# Patient Record
Sex: Female | Born: 1940 | Race: White | Hispanic: No | Smoking: Former smoker
Health system: Southern US, Community
[De-identification: ages and names within clinical notes are randomized; demographics above are authoritative.]

## PROBLEM LIST (undated history)

## (undated) DIAGNOSIS — G4733 Obstructive sleep apnea (adult) (pediatric): Secondary | ICD-10-CM

## (undated) DIAGNOSIS — J984 Other disorders of lung: Secondary | ICD-10-CM

## (undated) DIAGNOSIS — E876 Hypokalemia: Secondary | ICD-10-CM

## (undated) DIAGNOSIS — K219 Gastro-esophageal reflux disease without esophagitis: Secondary | ICD-10-CM

## (undated) DIAGNOSIS — C439 Malignant melanoma of skin, unspecified: Secondary | ICD-10-CM

## (undated) DIAGNOSIS — E119 Type 2 diabetes mellitus without complications: Secondary | ICD-10-CM

## (undated) DIAGNOSIS — Z950 Presence of cardiac pacemaker: Secondary | ICD-10-CM

## (undated) DIAGNOSIS — Z8739 Personal history of other diseases of the musculoskeletal system and connective tissue: Secondary | ICD-10-CM

## (undated) DIAGNOSIS — H353220 Exudative age-related macular degeneration, left eye, stage unspecified: Secondary | ICD-10-CM

## (undated) DIAGNOSIS — I495 Sick sinus syndrome: Secondary | ICD-10-CM

## (undated) DIAGNOSIS — E538 Deficiency of other specified B group vitamins: Secondary | ICD-10-CM

## (undated) DIAGNOSIS — N289 Disorder of kidney and ureter, unspecified: Secondary | ICD-10-CM

## (undated) DIAGNOSIS — I2510 Atherosclerotic heart disease of native coronary artery without angina pectoris: Secondary | ICD-10-CM

## (undated) DIAGNOSIS — H353110 Nonexudative age-related macular degeneration, right eye, stage unspecified: Secondary | ICD-10-CM

## (undated) DIAGNOSIS — J449 Chronic obstructive pulmonary disease, unspecified: Secondary | ICD-10-CM

## (undated) DIAGNOSIS — I509 Heart failure, unspecified: Secondary | ICD-10-CM

## (undated) DIAGNOSIS — E559 Vitamin D deficiency, unspecified: Secondary | ICD-10-CM

## (undated) DIAGNOSIS — Z9289 Personal history of other medical treatment: Secondary | ICD-10-CM

## (undated) DIAGNOSIS — E0500 Thyrotoxicosis with diffuse goiter without thyrotoxic crisis or storm: Secondary | ICD-10-CM

## (undated) DIAGNOSIS — I519 Heart disease, unspecified: Secondary | ICD-10-CM

## (undated) DIAGNOSIS — N189 Chronic kidney disease, unspecified: Secondary | ICD-10-CM

## (undated) DIAGNOSIS — Z9989 Dependence on other enabling machines and devices: Secondary | ICD-10-CM

## (undated) DIAGNOSIS — I4891 Unspecified atrial fibrillation: Secondary | ICD-10-CM

## (undated) DIAGNOSIS — E039 Hypothyroidism, unspecified: Secondary | ICD-10-CM

## (undated) DIAGNOSIS — H3530 Unspecified macular degeneration: Secondary | ICD-10-CM

## (undated) DIAGNOSIS — I10 Essential (primary) hypertension: Secondary | ICD-10-CM

## (undated) DIAGNOSIS — I219 Acute myocardial infarction, unspecified: Secondary | ICD-10-CM

## (undated) DIAGNOSIS — M1990 Unspecified osteoarthritis, unspecified site: Secondary | ICD-10-CM

## (undated) DIAGNOSIS — E785 Hyperlipidemia, unspecified: Secondary | ICD-10-CM

## (undated) DIAGNOSIS — Z8489 Family history of other specified conditions: Secondary | ICD-10-CM

## (undated) DIAGNOSIS — Z8719 Personal history of other diseases of the digestive system: Secondary | ICD-10-CM

## (undated) DIAGNOSIS — M109 Gout, unspecified: Secondary | ICD-10-CM

## (undated) DIAGNOSIS — I951 Orthostatic hypotension: Secondary | ICD-10-CM

## (undated) HISTORY — DX: Hyperlipidemia, unspecified: E78.5

## (undated) HISTORY — DX: Heart disease, unspecified: I51.9

## (undated) HISTORY — PX: APPENDECTOMY: SHX54

## (undated) HISTORY — DX: Presence of cardiac pacemaker: Z95.0

## (undated) HISTORY — DX: Vitamin D deficiency, unspecified: E55.9

## (undated) HISTORY — DX: Hypokalemia: E87.6

## (undated) HISTORY — PX: JOINT REPLACEMENT: SHX530

## (undated) HISTORY — PX: CORONARY ANGIOPLASTY WITH STENT PLACEMENT: SHX49

## (undated) HISTORY — DX: Atherosclerotic heart disease of native coronary artery without angina pectoris: I25.10

## (undated) HISTORY — DX: Chronic obstructive pulmonary disease, unspecified: J44.9

## (undated) HISTORY — DX: Disorder of kidney and ureter, unspecified: N28.9

## (undated) HISTORY — PX: PARATHYROIDECTOMY: SHX19

## (undated) HISTORY — PX: LAPAROSCOPIC CHOLECYSTECTOMY: SUR755

## (undated) HISTORY — PX: VAGINAL HYSTERECTOMY: SUR661

## (undated) HISTORY — DX: Deficiency of other specified B group vitamins: E53.8

## (undated) HISTORY — DX: Gastro-esophageal reflux disease without esophagitis: K21.9

## (undated) HISTORY — DX: Gout, unspecified: M10.9

## (undated) HISTORY — PX: TONSILLECTOMY: SUR1361

## (undated) HISTORY — PX: ATRIAL FIBRILLATION ABLATION: EP1191

## (undated) HISTORY — PX: TOTAL SHOULDER ARTHROPLASTY: SHX126

## (undated) HISTORY — DX: Unspecified atrial fibrillation: I48.91

## (undated) HISTORY — DX: Unspecified macular degeneration: H35.30

## (undated) HISTORY — PX: MELANOMA EXCISION: SHX5266

## (undated) HISTORY — DX: Hypocalcemia: E83.51

## (undated) HISTORY — DX: Acute myocardial infarction, unspecified: I21.9

---
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
## 2010-11-23 ENCOUNTER — Encounter: Payer: Self-pay | Admitting: Family Medicine

## 2013-03-14 HISTORY — PX: COLONOSCOPY W/ BIOPSIES AND POLYPECTOMY: SHX1376

## 2013-03-21 DIAGNOSIS — H05249 Constant exophthalmos, unspecified eye: Secondary | ICD-10-CM | POA: Diagnosis not present

## 2013-03-21 DIAGNOSIS — E0501 Thyrotoxicosis with diffuse goiter with thyrotoxic crisis or storm: Secondary | ICD-10-CM | POA: Diagnosis not present

## 2013-03-21 DIAGNOSIS — H04129 Dry eye syndrome of unspecified lacrimal gland: Secondary | ICD-10-CM | POA: Diagnosis not present

## 2013-03-25 DIAGNOSIS — E782 Mixed hyperlipidemia: Secondary | ICD-10-CM | POA: Diagnosis not present

## 2013-03-25 DIAGNOSIS — J309 Allergic rhinitis, unspecified: Secondary | ICD-10-CM | POA: Diagnosis not present

## 2013-03-25 DIAGNOSIS — IMO0001 Reserved for inherently not codable concepts without codable children: Secondary | ICD-10-CM | POA: Diagnosis not present

## 2013-03-25 DIAGNOSIS — I10 Essential (primary) hypertension: Secondary | ICD-10-CM | POA: Diagnosis not present

## 2013-03-25 DIAGNOSIS — R197 Diarrhea, unspecified: Secondary | ICD-10-CM | POA: Diagnosis not present

## 2013-03-25 DIAGNOSIS — I4891 Unspecified atrial fibrillation: Secondary | ICD-10-CM | POA: Diagnosis not present

## 2013-03-25 DIAGNOSIS — E039 Hypothyroidism, unspecified: Secondary | ICD-10-CM | POA: Diagnosis not present

## 2013-03-27 DIAGNOSIS — E782 Mixed hyperlipidemia: Secondary | ICD-10-CM | POA: Diagnosis not present

## 2013-03-27 DIAGNOSIS — I10 Essential (primary) hypertension: Secondary | ICD-10-CM | POA: Diagnosis not present

## 2013-03-27 DIAGNOSIS — IMO0001 Reserved for inherently not codable concepts without codable children: Secondary | ICD-10-CM | POA: Diagnosis not present

## 2013-03-27 DIAGNOSIS — R5381 Other malaise: Secondary | ICD-10-CM | POA: Diagnosis not present

## 2013-04-11 DIAGNOSIS — Z7901 Long term (current) use of anticoagulants: Secondary | ICD-10-CM | POA: Diagnosis not present

## 2013-04-12 DIAGNOSIS — E039 Hypothyroidism, unspecified: Secondary | ICD-10-CM | POA: Diagnosis not present

## 2013-04-12 DIAGNOSIS — D509 Iron deficiency anemia, unspecified: Secondary | ICD-10-CM | POA: Diagnosis not present

## 2013-04-12 DIAGNOSIS — I10 Essential (primary) hypertension: Secondary | ICD-10-CM | POA: Diagnosis not present

## 2013-04-12 DIAGNOSIS — E782 Mixed hyperlipidemia: Secondary | ICD-10-CM | POA: Diagnosis not present

## 2013-04-12 DIAGNOSIS — E119 Type 2 diabetes mellitus without complications: Secondary | ICD-10-CM | POA: Diagnosis not present

## 2013-04-12 DIAGNOSIS — I4891 Unspecified atrial fibrillation: Secondary | ICD-10-CM | POA: Diagnosis not present

## 2013-04-12 DIAGNOSIS — Z006 Encounter for examination for normal comparison and control in clinical research program: Secondary | ICD-10-CM | POA: Diagnosis not present

## 2013-04-12 DIAGNOSIS — R197 Diarrhea, unspecified: Secondary | ICD-10-CM | POA: Diagnosis not present

## 2013-04-16 DIAGNOSIS — I4891 Unspecified atrial fibrillation: Secondary | ICD-10-CM | POA: Diagnosis not present

## 2013-04-16 DIAGNOSIS — R002 Palpitations: Secondary | ICD-10-CM | POA: Diagnosis not present

## 2013-04-17 DIAGNOSIS — R0989 Other specified symptoms and signs involving the circulatory and respiratory systems: Secondary | ICD-10-CM | POA: Diagnosis not present

## 2013-04-17 DIAGNOSIS — I872 Venous insufficiency (chronic) (peripheral): Secondary | ICD-10-CM | POA: Diagnosis not present

## 2013-04-22 DIAGNOSIS — R609 Edema, unspecified: Secondary | ICD-10-CM | POA: Diagnosis not present

## 2013-04-22 DIAGNOSIS — R0602 Shortness of breath: Secondary | ICD-10-CM | POA: Diagnosis not present

## 2013-04-22 DIAGNOSIS — R197 Diarrhea, unspecified: Secondary | ICD-10-CM | POA: Diagnosis not present

## 2013-04-23 DIAGNOSIS — Z9861 Coronary angioplasty status: Secondary | ICD-10-CM | POA: Diagnosis not present

## 2013-04-23 DIAGNOSIS — I2510 Atherosclerotic heart disease of native coronary artery without angina pectoris: Secondary | ICD-10-CM | POA: Diagnosis not present

## 2013-04-23 DIAGNOSIS — E785 Hyperlipidemia, unspecified: Secondary | ICD-10-CM | POA: Diagnosis not present

## 2013-04-23 DIAGNOSIS — I498 Other specified cardiac arrhythmias: Secondary | ICD-10-CM | POA: Diagnosis not present

## 2013-04-23 DIAGNOSIS — R9430 Abnormal result of cardiovascular function study, unspecified: Secondary | ICD-10-CM | POA: Diagnosis not present

## 2013-04-26 DIAGNOSIS — IMO0001 Reserved for inherently not codable concepts without codable children: Secondary | ICD-10-CM | POA: Diagnosis not present

## 2013-04-26 DIAGNOSIS — I70209 Unspecified atherosclerosis of native arteries of extremities, unspecified extremity: Secondary | ICD-10-CM | POA: Diagnosis not present

## 2013-04-26 DIAGNOSIS — R197 Diarrhea, unspecified: Secondary | ICD-10-CM | POA: Diagnosis not present

## 2013-04-26 DIAGNOSIS — R609 Edema, unspecified: Secondary | ICD-10-CM | POA: Diagnosis not present

## 2013-05-14 DIAGNOSIS — I4891 Unspecified atrial fibrillation: Secondary | ICD-10-CM | POA: Diagnosis not present

## 2013-05-14 DIAGNOSIS — E782 Mixed hyperlipidemia: Secondary | ICD-10-CM | POA: Diagnosis not present

## 2013-05-14 DIAGNOSIS — I10 Essential (primary) hypertension: Secondary | ICD-10-CM | POA: Diagnosis not present

## 2013-05-14 DIAGNOSIS — IMO0001 Reserved for inherently not codable concepts without codable children: Secondary | ICD-10-CM | POA: Diagnosis not present

## 2013-05-30 DIAGNOSIS — Z7901 Long term (current) use of anticoagulants: Secondary | ICD-10-CM | POA: Diagnosis not present

## 2013-07-12 DIAGNOSIS — IMO0001 Reserved for inherently not codable concepts without codable children: Secondary | ICD-10-CM | POA: Diagnosis not present

## 2013-07-12 DIAGNOSIS — E782 Mixed hyperlipidemia: Secondary | ICD-10-CM | POA: Diagnosis not present

## 2013-07-12 DIAGNOSIS — R5383 Other fatigue: Secondary | ICD-10-CM | POA: Diagnosis not present

## 2013-07-12 DIAGNOSIS — I10 Essential (primary) hypertension: Secondary | ICD-10-CM | POA: Diagnosis not present

## 2013-07-12 DIAGNOSIS — R5381 Other malaise: Secondary | ICD-10-CM | POA: Diagnosis not present

## 2013-07-12 DIAGNOSIS — E039 Hypothyroidism, unspecified: Secondary | ICD-10-CM | POA: Diagnosis not present

## 2013-07-26 DIAGNOSIS — E119 Type 2 diabetes mellitus without complications: Secondary | ICD-10-CM | POA: Diagnosis not present

## 2013-07-26 DIAGNOSIS — R197 Diarrhea, unspecified: Secondary | ICD-10-CM | POA: Diagnosis not present

## 2013-07-26 DIAGNOSIS — E782 Mixed hyperlipidemia: Secondary | ICD-10-CM | POA: Diagnosis not present

## 2013-07-26 DIAGNOSIS — E039 Hypothyroidism, unspecified: Secondary | ICD-10-CM | POA: Diagnosis not present

## 2013-07-26 DIAGNOSIS — J309 Allergic rhinitis, unspecified: Secondary | ICD-10-CM | POA: Diagnosis not present

## 2013-07-26 DIAGNOSIS — I10 Essential (primary) hypertension: Secondary | ICD-10-CM | POA: Diagnosis not present

## 2013-07-26 DIAGNOSIS — J301 Allergic rhinitis due to pollen: Secondary | ICD-10-CM | POA: Diagnosis not present

## 2013-07-29 DIAGNOSIS — Z7901 Long term (current) use of anticoagulants: Secondary | ICD-10-CM | POA: Diagnosis not present

## 2013-08-14 DIAGNOSIS — IMO0001 Reserved for inherently not codable concepts without codable children: Secondary | ICD-10-CM | POA: Diagnosis not present

## 2013-08-14 DIAGNOSIS — R197 Diarrhea, unspecified: Secondary | ICD-10-CM | POA: Diagnosis not present

## 2013-08-14 DIAGNOSIS — E782 Mixed hyperlipidemia: Secondary | ICD-10-CM | POA: Diagnosis not present

## 2013-08-21 DIAGNOSIS — R05 Cough: Secondary | ICD-10-CM | POA: Diagnosis not present

## 2013-08-21 DIAGNOSIS — R059 Cough, unspecified: Secondary | ICD-10-CM | POA: Diagnosis not present

## 2013-08-21 DIAGNOSIS — J018 Other acute sinusitis: Secondary | ICD-10-CM | POA: Diagnosis not present

## 2013-09-09 DIAGNOSIS — Z7901 Long term (current) use of anticoagulants: Secondary | ICD-10-CM | POA: Diagnosis not present

## 2013-10-15 DIAGNOSIS — E212 Other hyperparathyroidism: Secondary | ICD-10-CM | POA: Diagnosis not present

## 2013-10-15 DIAGNOSIS — Z0000 Encounter for general adult medical examination without abnormal findings: Secondary | ICD-10-CM | POA: Diagnosis not present

## 2013-10-15 DIAGNOSIS — I10 Essential (primary) hypertension: Secondary | ICD-10-CM | POA: Diagnosis not present

## 2013-10-15 DIAGNOSIS — E039 Hypothyroidism, unspecified: Secondary | ICD-10-CM | POA: Diagnosis not present

## 2013-10-15 DIAGNOSIS — Z87891 Personal history of nicotine dependence: Secondary | ICD-10-CM | POA: Diagnosis not present

## 2013-10-15 DIAGNOSIS — C439 Malignant melanoma of skin, unspecified: Secondary | ICD-10-CM | POA: Diagnosis not present

## 2013-10-15 DIAGNOSIS — I4891 Unspecified atrial fibrillation: Secondary | ICD-10-CM | POA: Diagnosis not present

## 2013-10-15 DIAGNOSIS — E119 Type 2 diabetes mellitus without complications: Secondary | ICD-10-CM | POA: Diagnosis not present

## 2013-10-15 DIAGNOSIS — Z136 Encounter for screening for cardiovascular disorders: Secondary | ICD-10-CM | POA: Diagnosis not present

## 2013-10-15 DIAGNOSIS — E782 Mixed hyperlipidemia: Secondary | ICD-10-CM | POA: Diagnosis not present

## 2013-10-17 DIAGNOSIS — Z7901 Long term (current) use of anticoagulants: Secondary | ICD-10-CM | POA: Diagnosis not present

## 2013-10-18 DIAGNOSIS — E212 Other hyperparathyroidism: Secondary | ICD-10-CM | POA: Diagnosis not present

## 2013-10-18 DIAGNOSIS — E785 Hyperlipidemia, unspecified: Secondary | ICD-10-CM | POA: Diagnosis not present

## 2013-10-18 DIAGNOSIS — E782 Mixed hyperlipidemia: Secondary | ICD-10-CM | POA: Diagnosis not present

## 2013-10-18 DIAGNOSIS — E039 Hypothyroidism, unspecified: Secondary | ICD-10-CM | POA: Diagnosis not present

## 2013-10-18 DIAGNOSIS — E119 Type 2 diabetes mellitus without complications: Secondary | ICD-10-CM | POA: Diagnosis not present

## 2013-10-24 DIAGNOSIS — L821 Other seborrheic keratosis: Secondary | ICD-10-CM | POA: Diagnosis not present

## 2013-10-24 DIAGNOSIS — L259 Unspecified contact dermatitis, unspecified cause: Secondary | ICD-10-CM | POA: Diagnosis not present

## 2013-11-01 DIAGNOSIS — E785 Hyperlipidemia, unspecified: Secondary | ICD-10-CM | POA: Diagnosis not present

## 2013-11-01 DIAGNOSIS — E119 Type 2 diabetes mellitus without complications: Secondary | ICD-10-CM | POA: Diagnosis not present

## 2013-11-01 DIAGNOSIS — R002 Palpitations: Secondary | ICD-10-CM | POA: Diagnosis not present

## 2013-11-01 DIAGNOSIS — I10 Essential (primary) hypertension: Secondary | ICD-10-CM | POA: Diagnosis not present

## 2013-11-01 DIAGNOSIS — I4891 Unspecified atrial fibrillation: Secondary | ICD-10-CM | POA: Diagnosis not present

## 2013-11-06 DIAGNOSIS — I4891 Unspecified atrial fibrillation: Secondary | ICD-10-CM | POA: Diagnosis not present

## 2013-11-26 DIAGNOSIS — Z23 Encounter for immunization: Secondary | ICD-10-CM | POA: Diagnosis not present

## 2013-11-29 DIAGNOSIS — I4891 Unspecified atrial fibrillation: Secondary | ICD-10-CM | POA: Diagnosis not present

## 2013-11-29 DIAGNOSIS — E119 Type 2 diabetes mellitus without complications: Secondary | ICD-10-CM | POA: Diagnosis not present

## 2013-11-29 DIAGNOSIS — R002 Palpitations: Secondary | ICD-10-CM | POA: Diagnosis not present

## 2013-11-29 DIAGNOSIS — E785 Hyperlipidemia, unspecified: Secondary | ICD-10-CM | POA: Diagnosis not present

## 2013-11-29 DIAGNOSIS — I10 Essential (primary) hypertension: Secondary | ICD-10-CM | POA: Diagnosis not present

## 2013-11-29 DIAGNOSIS — Z87891 Personal history of nicotine dependence: Secondary | ICD-10-CM | POA: Diagnosis not present

## 2013-11-29 DIAGNOSIS — Z0000 Encounter for general adult medical examination without abnormal findings: Secondary | ICD-10-CM | POA: Diagnosis not present

## 2013-11-29 DIAGNOSIS — Z6836 Body mass index (BMI) 36.0-36.9, adult: Secondary | ICD-10-CM | POA: Diagnosis not present

## 2014-01-03 DIAGNOSIS — I4891 Unspecified atrial fibrillation: Secondary | ICD-10-CM | POA: Diagnosis not present

## 2014-01-10 DIAGNOSIS — I482 Chronic atrial fibrillation: Secondary | ICD-10-CM | POA: Diagnosis not present

## 2014-01-10 DIAGNOSIS — N390 Urinary tract infection, site not specified: Secondary | ICD-10-CM | POA: Diagnosis not present

## 2014-01-10 DIAGNOSIS — Z87891 Personal history of nicotine dependence: Secondary | ICD-10-CM | POA: Diagnosis not present

## 2014-01-10 DIAGNOSIS — Z6835 Body mass index (BMI) 35.0-35.9, adult: Secondary | ICD-10-CM | POA: Diagnosis not present

## 2014-01-10 DIAGNOSIS — Z0001 Encounter for general adult medical examination with abnormal findings: Secondary | ICD-10-CM | POA: Diagnosis not present

## 2014-01-10 DIAGNOSIS — I10 Essential (primary) hypertension: Secondary | ICD-10-CM | POA: Diagnosis not present

## 2014-01-17 DIAGNOSIS — N390 Urinary tract infection, site not specified: Secondary | ICD-10-CM | POA: Diagnosis not present

## 2014-01-17 DIAGNOSIS — I4891 Unspecified atrial fibrillation: Secondary | ICD-10-CM | POA: Diagnosis not present

## 2014-01-17 DIAGNOSIS — I480 Paroxysmal atrial fibrillation: Secondary | ICD-10-CM | POA: Diagnosis not present

## 2014-01-31 DIAGNOSIS — I480 Paroxysmal atrial fibrillation: Secondary | ICD-10-CM | POA: Diagnosis not present

## 2014-02-18 DIAGNOSIS — I480 Paroxysmal atrial fibrillation: Secondary | ICD-10-CM | POA: Diagnosis not present

## 2014-02-25 DIAGNOSIS — I480 Paroxysmal atrial fibrillation: Secondary | ICD-10-CM | POA: Diagnosis not present

## 2014-03-03 DIAGNOSIS — I10 Essential (primary) hypertension: Secondary | ICD-10-CM | POA: Diagnosis not present

## 2014-03-03 DIAGNOSIS — Z87891 Personal history of nicotine dependence: Secondary | ICD-10-CM | POA: Diagnosis not present

## 2014-03-03 DIAGNOSIS — I480 Paroxysmal atrial fibrillation: Secondary | ICD-10-CM | POA: Diagnosis not present

## 2014-03-03 DIAGNOSIS — E039 Hypothyroidism, unspecified: Secondary | ICD-10-CM | POA: Diagnosis not present

## 2014-03-03 DIAGNOSIS — Z6836 Body mass index (BMI) 36.0-36.9, adult: Secondary | ICD-10-CM | POA: Diagnosis not present

## 2014-03-03 DIAGNOSIS — Z0001 Encounter for general adult medical examination with abnormal findings: Secondary | ICD-10-CM | POA: Diagnosis not present

## 2014-03-03 DIAGNOSIS — E1165 Type 2 diabetes mellitus with hyperglycemia: Secondary | ICD-10-CM | POA: Diagnosis not present

## 2014-03-03 DIAGNOSIS — G4730 Sleep apnea, unspecified: Secondary | ICD-10-CM | POA: Diagnosis not present

## 2015-08-13 ENCOUNTER — Ambulatory Visit: Payer: Self-pay | Admitting: Cardiology

## 2015-08-17 DIAGNOSIS — Z961 Presence of intraocular lens: Secondary | ICD-10-CM | POA: Diagnosis not present

## 2015-08-17 DIAGNOSIS — H353222 Exudative age-related macular degeneration, left eye, with inactive choroidal neovascularization: Secondary | ICD-10-CM | POA: Diagnosis not present

## 2015-08-17 DIAGNOSIS — E119 Type 2 diabetes mellitus without complications: Secondary | ICD-10-CM | POA: Diagnosis not present

## 2015-08-17 DIAGNOSIS — H353112 Nonexudative age-related macular degeneration, right eye, intermediate dry stage: Secondary | ICD-10-CM | POA: Diagnosis not present

## 2015-08-19 ENCOUNTER — Encounter: Payer: Self-pay | Admitting: Family Medicine

## 2015-08-19 ENCOUNTER — Ambulatory Visit (INDEPENDENT_AMBULATORY_CARE_PROVIDER_SITE_OTHER): Payer: Medicare Other | Admitting: Family Medicine

## 2015-08-19 VITALS — BP 120/84 | HR 78 | Ht 63.0 in | Wt 212.0 lb

## 2015-08-19 DIAGNOSIS — E538 Deficiency of other specified B group vitamins: Secondary | ICD-10-CM

## 2015-08-19 DIAGNOSIS — H3530 Unspecified macular degeneration: Secondary | ICD-10-CM | POA: Diagnosis not present

## 2015-08-19 DIAGNOSIS — K222 Esophageal obstruction: Secondary | ICD-10-CM

## 2015-08-19 DIAGNOSIS — E119 Type 2 diabetes mellitus without complications: Secondary | ICD-10-CM | POA: Diagnosis not present

## 2015-08-19 DIAGNOSIS — E559 Vitamin D deficiency, unspecified: Secondary | ICD-10-CM | POA: Diagnosis not present

## 2015-08-19 DIAGNOSIS — I5022 Chronic systolic (congestive) heart failure: Secondary | ICD-10-CM | POA: Diagnosis not present

## 2015-08-19 DIAGNOSIS — Z23 Encounter for immunization: Secondary | ICD-10-CM | POA: Diagnosis not present

## 2015-08-19 DIAGNOSIS — E669 Obesity, unspecified: Secondary | ICD-10-CM

## 2015-08-19 DIAGNOSIS — Z923 Personal history of irradiation: Secondary | ICD-10-CM

## 2015-08-19 DIAGNOSIS — E1159 Type 2 diabetes mellitus with other circulatory complications: Secondary | ICD-10-CM | POA: Insufficient documentation

## 2015-08-19 DIAGNOSIS — D509 Iron deficiency anemia, unspecified: Secondary | ICD-10-CM | POA: Diagnosis not present

## 2015-08-19 DIAGNOSIS — M1000 Idiopathic gout, unspecified site: Secondary | ICD-10-CM

## 2015-08-19 DIAGNOSIS — E034 Atrophy of thyroid (acquired): Secondary | ICD-10-CM | POA: Diagnosis not present

## 2015-08-19 DIAGNOSIS — E038 Other specified hypothyroidism: Secondary | ICD-10-CM | POA: Diagnosis not present

## 2015-08-19 DIAGNOSIS — I480 Paroxysmal atrial fibrillation: Secondary | ICD-10-CM

## 2015-08-19 DIAGNOSIS — K219 Gastro-esophageal reflux disease without esophagitis: Secondary | ICD-10-CM | POA: Diagnosis not present

## 2015-08-19 DIAGNOSIS — E892 Postprocedural hypoparathyroidism: Secondary | ICD-10-CM

## 2015-08-19 DIAGNOSIS — E1165 Type 2 diabetes mellitus with hyperglycemia: Secondary | ICD-10-CM

## 2015-08-19 DIAGNOSIS — Z9009 Acquired absence of other part of head and neck: Secondary | ICD-10-CM

## 2015-08-19 NOTE — Patient Instructions (Addendum)
Taper off Protonix as tolerated.

## 2015-08-20 ENCOUNTER — Ambulatory Visit (INDEPENDENT_AMBULATORY_CARE_PROVIDER_SITE_OTHER): Payer: Medicare Other | Admitting: Cardiology

## 2015-08-20 ENCOUNTER — Encounter: Payer: Self-pay | Admitting: Cardiology

## 2015-08-20 ENCOUNTER — Other Ambulatory Visit: Payer: Self-pay | Admitting: Family Medicine

## 2015-08-20 VITALS — BP 124/76 | HR 74 | Ht 63.0 in | Wt 213.0 lb

## 2015-08-20 DIAGNOSIS — I25708 Atherosclerosis of coronary artery bypass graft(s), unspecified, with other forms of angina pectoris: Secondary | ICD-10-CM

## 2015-08-20 DIAGNOSIS — I10 Essential (primary) hypertension: Secondary | ICD-10-CM

## 2015-08-20 DIAGNOSIS — I4891 Unspecified atrial fibrillation: Secondary | ICD-10-CM | POA: Diagnosis not present

## 2015-08-20 DIAGNOSIS — K222 Esophageal obstruction: Secondary | ICD-10-CM

## 2015-08-20 DIAGNOSIS — R079 Chest pain, unspecified: Secondary | ICD-10-CM | POA: Diagnosis not present

## 2015-08-20 DIAGNOSIS — I440 Atrioventricular block, first degree: Secondary | ICD-10-CM

## 2015-08-20 DIAGNOSIS — Z955 Presence of coronary angioplasty implant and graft: Secondary | ICD-10-CM

## 2015-08-20 DIAGNOSIS — E892 Postprocedural hypoparathyroidism: Secondary | ICD-10-CM | POA: Insufficient documentation

## 2015-08-20 DIAGNOSIS — N183 Chronic kidney disease, stage 3 unspecified: Secondary | ICD-10-CM

## 2015-08-20 DIAGNOSIS — H3530 Unspecified macular degeneration: Secondary | ICD-10-CM | POA: Insufficient documentation

## 2015-08-20 DIAGNOSIS — K219 Gastro-esophageal reflux disease without esophagitis: Secondary | ICD-10-CM | POA: Insufficient documentation

## 2015-08-20 DIAGNOSIS — E669 Obesity, unspecified: Secondary | ICD-10-CM | POA: Insufficient documentation

## 2015-08-20 DIAGNOSIS — Z9009 Acquired absence of other part of head and neck: Secondary | ICD-10-CM | POA: Insufficient documentation

## 2015-08-20 DIAGNOSIS — E785 Hyperlipidemia, unspecified: Secondary | ICD-10-CM

## 2015-08-20 DIAGNOSIS — I5030 Unspecified diastolic (congestive) heart failure: Secondary | ICD-10-CM | POA: Insufficient documentation

## 2015-08-20 DIAGNOSIS — Z923 Personal history of irradiation: Secondary | ICD-10-CM | POA: Insufficient documentation

## 2015-08-20 LAB — COMPREHENSIVE METABOLIC PANEL
ALBUMIN: 4.4 g/dL (ref 3.5–4.8)
ALK PHOS: 168 IU/L — AB (ref 39–117)
ALT: 21 IU/L (ref 0–32)
AST: 25 IU/L (ref 0–40)
Albumin/Globulin Ratio: 1.8 (ref 1.2–2.2)
BILIRUBIN TOTAL: 0.3 mg/dL (ref 0.0–1.2)
BUN / CREAT RATIO: 20 (ref 12–28)
BUN: 23 mg/dL (ref 8–27)
CHLORIDE: 102 mmol/L (ref 96–106)
CO2: 23 mmol/L (ref 18–29)
CREATININE: 1.13 mg/dL — AB (ref 0.57–1.00)
Calcium: 9.2 mg/dL (ref 8.7–10.3)
GFR calc Af Amer: 55 mL/min/{1.73_m2} — ABNORMAL LOW (ref >59)
GFR calc non Af Amer: 48 mL/min/{1.73_m2} — ABNORMAL LOW (ref >59)
GLUCOSE: 156 mg/dL — AB (ref 65–99)
Globulin, Total: 2.5 g/dL (ref 1.5–4.5)
Potassium: 4 mmol/L (ref 3.5–5.2)
SODIUM: 145 mmol/L — AB (ref 134–144)
Total Protein: 6.9 g/dL (ref 6.0–8.5)

## 2015-08-20 LAB — CBC
HEMATOCRIT: 37.8 % (ref 34.0–46.6)
Hemoglobin: 12.4 g/dL (ref 11.1–15.9)
MCH: 30.4 pg (ref 26.6–33.0)
MCHC: 32.8 g/dL (ref 31.5–35.7)
MCV: 93 fL (ref 79–97)
PLATELETS: 223 10*3/uL (ref 150–379)
RBC: 4.08 x10E6/uL (ref 3.77–5.28)
RDW: 16.2 % — AB (ref 12.3–15.4)
WBC: 6.4 10*3/uL (ref 3.4–10.8)

## 2015-08-20 LAB — MICROALBUMIN / CREATININE URINE RATIO
CREATININE, UR: 51.1 mg/dL
Microalbumin, Urine: <3 ug/mL

## 2015-08-20 LAB — TSH+T4F+T3FREE
FREE T4: 0.93 ng/dL (ref 0.82–1.77)
T3 FREE: 3.1 pg/mL (ref 2.0–4.4)
TSH: 0.229 u[IU]/mL — AB (ref 0.450–4.500)

## 2015-08-20 LAB — HEMOGLOBIN A1C
Est. average glucose Bld gHb Est-mCnc: 151 mg/dL
Hgb A1c MFr Bld: 6.9 % — ABNORMAL HIGH (ref 4.8–5.6)

## 2015-08-20 LAB — PROTIME-INR
INR: 2.1 — AB (ref 0.8–1.2)
PROTHROMBIN TIME: 21.8 s — AB (ref 9.1–12.0)

## 2015-08-20 LAB — VITAMIN B12

## 2015-08-20 LAB — VITAMIN D 25 HYDROXY (VIT D DEFICIENCY, FRACTURES): Vit D, 25-Hydroxy: 59 ng/mL (ref 30.0–100.0)

## 2015-08-20 LAB — URIC ACID: URIC ACID: 3.6 mg/dL (ref 2.5–7.1)

## 2015-08-20 MED ORDER — ALLOPURINOL 100 MG PO TABS: 100.0000 mg | ORAL_TABLET | Freq: Every day | ORAL | Status: DC

## 2015-08-20 MED ORDER — THYROID 97.5 MG PO TABS: 1.0000 | ORAL_TABLET | Freq: Every day | ORAL | Status: DC

## 2015-08-20 NOTE — Patient Instructions (Addendum)
Medication Instructions:  Your physician recommends that you continue on your current medications as directed. Please refer to the Current Medication list given to you today.   Labwork: Coumadin clinic for INR check.  Date & Time: ___________________________________________________________  Testing/Procedures: Your physician has requested that you have an echocardiogram. Echocardiography is a painless test that uses sound waves to create images of your heart. It provides your doctor with information about the size and shape of your heart and how well your heart's chambers and valves are working. This procedure takes approximately one hour. There are no restrictions for this procedure.  Date & Time: _____________________________________________________________  [facility]  Your caregiver has ordered a Stress Test with nuclear imaging. The purpose of this test is to evaluate the blood supply to your heart muscle. This procedure is referred to as a "Non-Invasive Stress Test." This is because other than having an IV started in your vein, nothing is inserted or "invades" your body. Cardiac stress tests are done to find areas of poor blood flow to the heart by determining the extent of coronary artery disease (CAD). Some patients exercise on a treadmill, which naturally increases the blood flow to your heart, while others who are  unable to walk on a treadmill due to physical limitations have a pharmacologic/chemical stress agent called Lexiscan . This medicine will mimic walking on a treadmill by temporarily increasing your coronary blood flow.   Please note: these test may take anywhere between 2-4 hours to complete  PLEASE REPORT TO [facility] AT THE FIRST DESK WILL DIRECT YOU WHERE TO GO  Date of Procedure:__Friday September 04, 2015 at 07:30AM__  Arrival Time for Procedure:____Arrive at 07:15AM_________  Instructions regarding medication:   __X__ : Hold  diabetes medication morning of procedure Metformin  __X__:  Hold Lasix the morning of procedure   PLEASE NOTIFY THE OFFICE AT LEAST 24 HOURS IN ADVANCE IF YOU ARE UNABLE TO KEEP YOUR APPOINTMENT.  [phone] AND  PLEASE NOTIFY NUCLEAR MEDICINE AT [facility] AT LEAST 24 HOURS IN ADVANCE IF YOU ARE UNABLE TO KEEP YOUR APPOINTMENT. [phone]  How to prepare for your Myoview test:   Do not eat or drink after midnight  No caffeine for 24 hours prior to test  No smoking 24 hours prior to test.  Your medication may be taken with water.  If your doctor stopped a medication because of this test, do not take that medication.  Ladies, please do not wear dresses.  Skirts or pants are appropriate. Please wear a short sleeve shirt.  No perfume, cologne or lotion.  Wear comfortable walking shoes. No heels!   Follow-Up: Your physician recommends that you schedule a follow-up appointment after testing to review results with Dr. Yvone Neu.  Date & Time: __________________________________________________________   Any Other Special Instructions Will Be Listed Below (If Applicable).     If you need a refill on your cardiac medications before your next appointment, please call your pharmacy.  Echocardiogram An echocardiogram, or echocardiography, uses sound waves (ultrasound) to produce an image of your heart. The echocardiogram is simple, painless, obtained within a short period of time, and offers valuable information to your health care provider. The images from an echocardiogram can provide information such as:  Evidence of coronary artery disease (CAD).  Heart size.  Heart muscle function.  Heart valve function.  Aneurysm detection.  Evidence of a past heart attack.  Fluid buildup around the heart.  Heart muscle thickening.  Assess heart  valve function. LET [facility] CARE PROVIDER KNOW ABOUT:  Any allergies you have.  All medicines you are taking, including vitamins,  herbs, eye drops, creams, and over-the-counter medicines.  Previous problems you or members of your family have had with the use of anesthetics.  Any blood disorders you have.  Previous surgeries you have had.  Medical conditions you have.  Possibility of pregnancy, if this applies. BEFORE THE PROCEDURE  No special preparation is needed. Eat and drink normally.  PROCEDURE   In order to produce an image of your heart, gel will be applied to your chest and a wand-like tool (transducer) will be moved over your chest. The gel will help transmit the sound waves from the transducer. The sound waves will harmlessly bounce off your heart to allow the heart images to be captured in real-time motion. These images will then be recorded.  You may need an IV to receive a medicine that improves the quality of the pictures. AFTER THE PROCEDURE You may return to your normal schedule including diet, activities, and medicines, unless your health care provider tells you otherwise.   This information is not intended to replace advice given to you by your health care provider. Make sure you discuss any questions you have with your health care provider.   Document Released: 02/26/2000 Document Revised: 03/21/2014 Document Reviewed: 11/05/2012 Elsevier Interactive Patient Education 2016 [place].   Pharmacologic Stress Electrocardiogram A pharmacologic stress electrocardiogram is a heart (cardiac) test that uses nuclear imaging to evaluate the blood supply to your heart. This test may also be called a pharmacologic stress electrocardiography. Pharmacologic means that a medicine is used to increase your heart rate and blood pressure.  This stress test is done to find areas of poor blood flow to the heart by determining the extent of coronary artery disease (CAD). Some people exercise on a treadmill, which naturally increases the blood flow to the heart. For those people unable to exercise on a treadmill, a  medicine is used. This medicine stimulates your heart and will cause your heart to beat harder and more quickly, as if you were exercising.  Pharmacologic stress tests can help determine:  The adequacy of blood flow to your heart during increased levels of activity in order to clear you for discharge home.  The extent of coronary artery blockage caused by CAD.  Your prognosis if you have suffered a heart attack.  The effectiveness of cardiac procedures done, such as an angioplasty, which can increase the circulation in your coronary arteries.  Causes of chest pain or pressure. LET [facility] CARE PROVIDER KNOW ABOUT:  Any allergies you have.  All medicines you are taking, including vitamins, herbs, eye drops, creams, and over-the-counter medicines.  Previous problems you or members of your family have had with the use of anesthetics.  Any blood disorders you have.  Previous surgeries you have had.  Medical conditions you have.  Possibility of pregnancy, if this applies.  If you are currently breastfeeding. RISKS AND COMPLICATIONS Generally, this is a safe procedure. However, as with any procedure, complications can occur. Possible complications include:  You develop pain or pressure in the following areas:  Chest.  Jaw or neck.  Between your shoulder blades.  Radiating down your left arm.  Headache.  Dizziness or light-headedness.  Shortness of breath.  Increased or irregular heartbeat.  Low blood pressure.  Nausea or vomiting.  Flushing.  Redness going up the arm and slight pain during injection of medicine.  Heart attack (rare). BEFORE THE PROCEDURE   Avoid all forms of caffeine for 24 hours before your test or as directed by your health care provider. This includes coffee, tea (even decaffeinated tea), caffeinated sodas, chocolate, cocoa, and certain pain medicines.  Follow your health care provider's instructions regarding eating and drinking before  the test.  Take your medicines as directed at regular times with water unless instructed otherwise. Exceptions may include:  If you have diabetes, ask how you are to take your insulin or pills. It is common to adjust insulin dosing the morning of the test.  If you are taking beta-blocker medicines, it is important to talk to your health care provider about these medicines well before the date of your test. Taking beta-blocker medicines may interfere with the test. In some cases, these medicines need to be changed or stopped 24 hours or more before the test.  If you wear a nitroglycerin patch, it may need to be removed prior to the test. Ask your health care provider if the patch should be removed before the test.  If you use an inhaler for any breathing condition, bring it with you to the test.  If you are an outpatient, bring a snack so you can eat right after the stress phase of the test.  Do not smoke for 4 hours prior to the test or as directed by your health care provider.  Do not apply lotions, powders, creams, or oils on your chest prior to the test.  Wear comfortable shoes and clothing. Let your health care provider know if you were unable to complete or follow the preparations for your test. PROCEDURE   Multiple patches (electrodes) will be put on your chest. If needed, small areas of your chest may be shaved to get better contact with the electrodes. Once the electrodes are attached to your body, multiple wires will be attached to the electrodes, and your heart rate will be monitored.  An IV access will be started. A nuclear trace (isotope) is given. The isotope may be given intravenously, or it may be swallowed. Nuclear refers to several types of radioactive isotopes, and the nuclear isotope lights up the arteries so that the nuclear images are clear. The isotope is absorbed by your body. This results in low radiation exposure.  A resting nuclear image is taken to show how your  heart functions at rest.  A medicine is given through the IV access.  A second scan is done about 1 hour after the medicine injection and determines how your heart functions under stress.  During this stress phase, you will be connected to an electrocardiogram machine. Your blood pressure and oxygen levels will be monitored. AFTER THE PROCEDURE   Your heart rate and blood pressure will be monitored after the test.  You may return to your normal schedule, including diet,activities, and medicines, unless your health care provider tells you otherwise.   This information is not intended to replace advice given to you by your health care provider. Make sure you discuss any questions you have with your health care provider.   Document Released: 07/17/2008 Document Revised: 03/05/2013 Document Reviewed: 11/05/2012 Elsevier Interactive Patient Education 2016 [employer]   Warfarin: What You Need to Know Warfarin is an anticoagulant. Anticoagulants help prevent the formation of blood clots. They also help stop the growth of blood clots. Warfarin is sometimes referred to as a "blood thinner."  Normally, when body tissues are cut or damaged, the blood clots in  order to prevent blood loss. Sometimes clots form inside your blood vessels and obstruct the flow of blood through your circulatory system (thrombosis). These clots may travel through your bloodstream and become lodged in smaller blood vessels in your brain, which can cause a stroke, or in your lungs (pulmonary embolism). WHO SHOULD USE WARFARIN? Warfarin is prescribed for people at risk of developing harmful blood clots:  People with surgically implanted mechanical heart valves, irregular heart rhythms called atrial fibrillation, and certain clotting disorders.  People who have developed harmful blood clotting in the past, including those who have had a stroke or a pulmonary embolism, or thrombosis in their legs (deep vein thrombosis  [DVT]).  People with an existing blood clot, such as a pulmonary embolism. WARFARIN DOSING Warfarin tablets come in different strengths. Each tablet strength is a different color, with the amount of warfarin (in milligrams) clearly printed on the tablet. If the color of your tablet is different than usual when you receive a new prescription, report it immediately to your pharmacist or health care provider. WARFARIN MONITORING The goal of warfarin therapy is to lessen the clotting tendency of blood but not prevent clotting completely. Your health care provider will monitor the anticoagulation effect of warfarin closely and adjust your dose as needed. For your safety, blood tests called prothrombin time (PT) or international normalized ratio (INR) are used to measure the effects of warfarin. Both of these tests can be done with a finger stick or a blood draw. The longer it takes the blood to clot, the higher the PT or INR. Your health care provider will inform you of your "target" PT or INR range. If, at any time, your PT or INR is above the target range, there is a risk of bleeding. If your PT or INR is below the target range, there is a risk of clotting. Whether you are started on warfarin while you are in the hospital or in your health care provider's office, you will need to have your PT or INR checked within one week of starting the medicine. Initially, some people are asked to have their PT or INR checked as much as twice a week. Once you are on a stable maintenance dose, the PT or INR is checked less often, usually once every 2 to 4 weeks. The warfarin dose may be adjusted if the PT or INR is not within the target range. It is important to keep all laboratory and health care provider follow-up appointments. Not keeping appointments could result in a chronic or permanent injury, pain, or disability because warfarin is a medicine that requires close monitoring. WHAT ARE THE SIDE EFFECTS OF  WARFARIN?  Too much warfarin can cause bleeding (hemorrhage) from any part of the body. This may include bleeding from the gums, blood in the urine, bloody or dark stools, a nosebleed that is not easily stopped, coughing up blood, or vomiting blood.  Too little warfarin can increase the risk of blood clots.  Too little or too much warfarin can also increase the risk of a stroke.  Warfarin use may cause a skin rash or irritation, an unusual fever, continual nausea or stomach upset, or severe pain in your joints or back. SPECIAL PRECAUTIONS WHILE TAKING WARFARIN Warfarin should be taken exactly as directed. It is very important to take warfarin as directed since bleeding or blood clots could result in chronic or permanent injury, pain, or disability.  Take your medicine at the same time every day. If you  forget to take your dose, you can take it if it is within 6 hours of when it was due.  Do not change the dose of warfarin on your own to make up for missed or extra doses.  If you miss more than 2 doses in a row, you should contact your health care provider for advice. Avoid situations that cause bleeding. You may have a tendency to bleed more easily than usual while taking warfarin. The following actions can limit bleeding:  Using a softer toothbrush.  Flossing with waxed floss rather than unwaxed floss.  Shaving with an Copy rather than a blade.  Limiting the use of sharp objects.  Avoiding potentially harmful activities, such as contact sports. Warfarin and Pregnancy or Breastfeeding  Warfarin is not advised during the first trimester of pregnancy due to an increased risk of birth defects. In certain situations, a woman may take warfarin after her first trimester of pregnancy. A woman who becomes pregnant or plans to become pregnant while taking warfarin should notify her health care provider immediately.  Although warfarin does not pass into breast milk, a woman who wishes  to breastfeed while taking warfarin should also consult with her health care provider. Alcohol, Smoking, and Illicit Drug Use  Alcohol affects how warfarin works in the body. It is best to avoid alcoholic drinks or consume very small amounts while taking warfarin. In general, alcohol intake should be limited to 1 oz (30 mL) of liquor, 6 oz (180 mL) of wine, or 12 oz (360 mL) of beer each day. Notify your health care provider if you change your alcohol intake.  Smoking affects how warfarin works. It is best to avoid smoking while taking warfarin. Notify your health care provider if you change your smoking habits.  It is best to avoid all illicit drugs while taking warfarin since there are few studies that show how warfarin interacts with these drugs. Other Medicines and Dietary Supplements Many prescription and over-the-counter medicines can interfere with warfarin. Be sure all of your health care providers know you are taking warfarin. Notify your health care provider who prescribed warfarin for you or your pharmacist before starting or stopping any new medicines, including over-the-counter vitamins, dietary supplements, and pain medicines. Your warfarin dose may need to be adjusted. Some common over-the-counter medicines that may increase the risk of bleeding while taking warfarin include:   Acetaminophen.  Aspirin.  Nonsteroidal anti-inflammatory medicines (NSAIDs), such as ibuprofen or naproxen.  Vitamin E. Dietary Considerations  Foods that have moderate or high amounts of vitamin K can interfere with warfarin. Avoid major changes in your diet or notify your health care provider before changing your diet. Eat a consistent amount of foods that have moderate or high amounts of vitamin K. Eating less foods containing vitamin K can increase the risk of bleeding. Eating more foods containing vitamin K can increase the risk of blood clots. Additional questions about dietary considerations can be  discussed with a dietitian. Foods that are very high in vitamin K:  Greens, such as Swiss chard and beet, collard, mustard, or turnip greens (fresh or frozen, cooked).  Kale (fresh or frozen, cooked).  Parsley (raw).  Spinach (cooked). Foods that are high in vitamin K:  Asparagus (frozen, cooked).  Broccoli.  Bok choy (cooked).  Brussels sprouts (fresh or frozen, cooked).  Cabbage (cooked).   Coleslaw. Foods that are moderately high in vitamin K:  Blueberries.  Black-eyed peas.  Endive (raw).  Green leaf lettuce (raw).  Green scallions (raw).  Kale (raw).  Okra (frozen, cooked).  Plantains (fried).  Romaine lettuce (raw).  Sauerkraut (canned).  Spinach (raw). CALL YOUR CLINIC OR HEALTH CARE PROVIDER IF YOU:  Plan to have any surgery or procedure.  Feel sick, especially if you have diarrhea or vomiting.  Experience or anticipate any major changes in your diet.  Start or stop a prescription or over-the-counter medicine.  Become, plan to become, or think you may be pregnant.  Are having heavier than usual menstrual periods.  Have had a fall, accident, or any symptoms of bleeding or unusual bruising.  Develop an unusual fever. CALL 911 IN THE [place] OR GO TO THE EMERGENCY DEPARTMENT IF YOU:   Think you may be having an allergic reaction to warfarin. The signs of an allergic reaction could include itching, rash, hives, swelling, chest tightness, or trouble breathing.  See signs of blood in your urine. The signs could include reddish, pinkish, or tea-colored urine.  See signs of blood in your stools. The signs could include bright red or black stools.  Vomit or cough up blood. In these instances, the blood could have either a bright red or a "coffee-grounds" appearance.  Have bleeding that will not stop after applying pressure for 30 minutes such as cuts, nosebleeds, or other injuries.  Have severe pain in your joints or back.  Have a new and  severe headache.  Have sudden weakness or numbness of your face, arm, or leg, especially on one side of your body.  Have sudden confusion or trouble understanding.  Have sudden trouble seeing in one or both eyes.  Have sudden trouble walking, dizziness, loss of balance, or coordination.  Have trouble speaking or understanding (aphasia).   This information is not intended to replace advice given to you by your health care provider. Make sure you discuss any questions you have with your health care provider.   Document Released: 02/28/2005 Document Revised: 03/21/2014 Document Reviewed: 08/24/2012 Elsevier Interactive Patient Education [employer].

## 2015-08-20 NOTE — Progress Notes (Signed)
Date:  08/19/2015   Name:  Arbor Stormont   DOB:  07-14-1940   MRN:  [id number]  PCP:  Adline Potter, MD    Chief Complaint: Establish Care   History of Present Illness:  This is a 75 y.o. female seen for initial visit, recently moved here from [facility]. Hx asymptomatic COPD on CT scan, non-smoker. S/p cardiac stent placement 3-4 yrs ago and afib s/p ablation, now paroxysmal, on warfarin x yrs, has appt with cardiologist next month. Hx B12 and vit def on supplementation. Hx T2DM with a1c 7.0% in January on insulin in past. On Nature Throid x 3 yrs, feels she needs T3, on Synthroid in past, wants T3/4 levels checked with TSH. Hx high TG, low HDL on Pravachol. Hx GERD with HH and esophageal stricture on chronic Protonix. Has wet and dry MD, seeing ophtho. Hx gout well controlled with allopurinol. On lisinopril to protect kidney, takes lasix 40 mg qod alternating with 60 mg qod, CHF mentioned in past. Hx Fe def anemia on iron supplement. On Ca++, co Q10 for primary prevention. Parathyroid surgery in 06-16-1977 and June 16, 2008 for hypercalcemia. S/p R shoulder surgery in June 17, 2007. Father died MI, mother died age 86 during surgery, brother died hep C, sister died EtOHic liver dz. Last tetanus imm 2006-06-17, Prevnar last year, zoster imm 2011/06/17. Last mammo age 52 ok. Last colonoscopy 06/16/2013 with polyps, repeat in 5 years. Weight up 20# with move.  Review of Systems:  Review of Systems  Constitutional: Negative for fever and fatigue.  Respiratory: Negative for cough and shortness of breath.   Cardiovascular: Negative for chest pain and leg swelling.  Endocrine: Negative for polyuria.  Genitourinary: Negative for difficulty urinating.  Neurological: Negative for syncope and light-headedness.       No recent falls    Patient Active Problem List   Diagnosis Date Noted  . Obesity, Class II, BMI 35-39.9 08/20/2015  . GERD with stricture 08/20/2015  . Macular degeneration 08/20/2015  . CHF (congestive heart failure) ([place])  08/20/2015  . History of parathyroidectomy 08/20/2015  . History of radioactive iodine thyroid ablation 08/20/2015  . Atrial fibrillation ([place]) 08/19/2015  . Diabetes mellitus type 2, controlled, without complications ([place]) 0000000  . Gout 08/19/2015  . Iron deficiency anemia 08/19/2015    Prior to Admission medications   Medication Sig Start Date End Date Taking? Authorizing Provider  allopurinol (ZYLOPRIM) 300 MG tablet Take 300 mg by mouth daily.   Yes Historical Provider, MD  aspirin 81 MG tablet Take 81 mg by mouth daily.   Yes Historical Provider, MD  B Complex Vitamins (VITAMIN-B COMPLEX) TABS Take 1 tablet by mouth daily.   Yes Historical Provider, MD  calcium carbonate (CALCIUM 600) 600 MG TABS tablet Take 600 mg by mouth daily with breakfast.    Yes Historical Provider, MD  Cholecalciferol (VITAMIN D3) 5000 units TABS Take 1 tablet by mouth every morning.   Yes Historical Provider, MD  Coenzyme Q10 (COQ10) 200 MG CAPS Take 1 capsule by mouth daily.   Yes Historical Provider, MD  Cyanocobalamin (B-12 COMPLIANCE INJECTION IJ) Inject as directed every 30 (thirty) days.   Yes Historical Provider, MD  Ferrous Sulfate (IRON) 325 (65 Fe) MG TABS Take 1 tablet by mouth daily.   Yes Historical Provider, MD  furosemide (LASIX) 20 MG tablet Take 40mg  by mouth every other day. Take 60mg  by mouth every other day. Alternating days   Yes Historical Provider, MD  lisinopril (PRINIVIL,ZESTRIL) 2.5 MG tablet Take 2.5  mg by mouth daily.   Yes Historical Provider, MD  Magnesium 500 MG TABS Take 1 tablet by mouth daily.   Yes Historical Provider, MD  Melatonin 5 MG TABS Take 1 tablet by mouth at bedtime.   Yes Historical Provider, MD  metFORMIN (GLUCOPHAGE) 500 MG tablet Take 500 mg by mouth 2 (two) times daily with a meal.   Yes Historical Provider, MD  Multiple Vitamins-Minerals (PRESERVISION AREDS 2 PO) Take by mouth at bedtime.   Yes Historical Provider, MD  pantoprazole (PROTONIX) 40 MG tablet  Take 40 mg by mouth daily.   Yes Historical Provider, MD  potassium chloride (KLOR-CON) 8 MEQ tablet Take 8 mEq by mouth 2 (two) times daily.   Yes Historical Provider, MD  pravastatin (PRAVACHOL) 40 MG tablet Take 40 mg by mouth daily.   Yes Historical Provider, MD  sitaGLIPtin (JANUVIA) 100 MG tablet Take 100 mg by mouth daily.   Yes Historical Provider, MD  Thyroid (NATURE-THROID) 113.75 MG TABS Take 1 tablet by mouth at bedtime.   Yes Historical Provider, MD  vitamin B-12 (CYANOCOBALAMIN) 500 MCG tablet Take 500 mcg by mouth daily.   Yes Historical Provider, MD  warfarin (COUMADIN) 7.5 MG tablet Take 7.5 mg by mouth daily.   Yes Historical Provider, MD  Isosorbide Mononitrate (IMDUR PO) Take 1 tablet by mouth daily.    Historical Provider, MD  nitroGLYCERIN (NITROSTAT) 0.4 MG SL tablet Place 0.4 mg under the tongue every 5 (five) minutes as needed for chest pain.    Historical Provider, MD    Allergies  Allergen Reactions  . Demerol [Meperidine] Anaphylaxis  . Sulfa Antibiotics Anaphylaxis  . Tetracyclines & Related     Made nose, lips,  And tongue itchy    Past Surgical History  Procedure Laterality Date  . Vaginal hysterectomy    . Thyroid surgery    . Parathyroid surgery      x 2  . Cholecystectomy    . Total shoulder arthroplasty Right     x 2  . Colonoscopy  2015    1 polyp removed- repeat 3 years  . Coronary angioplasty with stent placement      Social History  Substance Use Topics  . Smoking status: Never Smoker   . Smokeless tobacco: None  . Alcohol Use: 0.0 oz/week    0 Standard drinks or equivalent per week    History reviewed. No pertinent family history.  Medication list has been reviewed and updated.  Physical Examination: BP 120/84 mmHg  Pulse 78  Ht 5\' 3"  (1.6 m)  Wt 212 lb (96.163 kg)  BMI 37.56 kg/m2  Physical Exam  Constitutional: She is oriented to person, place, and time. She appears well-developed and well-nourished.  HENT:  Head:  Normocephalic and atraumatic.  Right Ear: External ear normal.  Left Ear: External ear normal.  Nose: Nose normal.  Mouth/Throat: Oropharynx is clear and moist.  TM's clear  Eyes: Conjunctivae and EOM are normal. Pupils are equal, round, and reactive to light.  Neck: Neck supple. No thyromegaly present.  Cardiovascular: Normal rate, regular rhythm and normal heart sounds.   Pulmonary/Chest: Effort normal and breath sounds normal.  Abdominal: Soft. She exhibits no distension and no mass. There is no tenderness.  Musculoskeletal: She exhibits no edema.  Lymphadenopathy:    She has no cervical adenopathy.  Neurological: She is alert and oriented to person, place, and time.  Skin: Skin is warm and dry.  Psychiatric: She has a normal mood and  affect. Her behavior is normal.  Nursing note and vitals reviewed.   Assessment and Plan:  1. Paroxysmal atrial fibrillation [facility]) S/p ablation, has appt with cardiologist - INR/PT - Comprehensive Metabolic Panel (CMET)  2. Controlled type 2 diabetes mellitus without complication, without long-term current use of insulin (HCC) Unclear control on metformin/Januvia - HgB A1c - Urine Microalbumin w/creat. ratio  3. Hypothyroidism due to acquired atrophy of thyroid On Nature Throid (Synthroid in past) - TSH+T4F+T3Free  4. Vitamin D deficiency On supplement - Vitamin D (25 hydroxy)  5. B12 deficiency On supplement - B12  6. Iron deficiency anemia On supplement - CBC  7. Obesity, Class II, BMI 35-39.9 Exercise/ weight loss discussed  8. Idiopathic gout, unspecified chronicity, unspecified site On allopurinol - Uric acid  9. GERD with stricture On chronic PPI, consider taper in future  10. Macular degeneration Seeing ophtho  11. Chronic systolic congestive heart failure ([place]) Per hx, on lisinopril/Lasix, seeing cards  12. History of parathyroidectomy  13. History of radioactive iodine thyroid ablation  14. Need for  pneumococcal vaccination - Pneumococcal polysaccharide vaccine 23-valent greater than or equal to 2yo subcutaneous/IM  Return in about 3 weeks (around 09/09/2015).  Satira Anis. [facility]  08/20/2015

## 2015-08-20 NOTE — Progress Notes (Signed)
Cardiology Office Note   Date:  08/20/2015   ID:  Dawn Garcia, DOB 04-Jun-1940, MRN [id number]  Referring Doctor:  Adline Potter, MD   Cardiologist:   Wende Bushy, MD   Reason for consultation:  Chief Complaint  Patient presents with  . New Patient (Initial Visit)  CAD, Afib    History of Present Illness: Dawn Garcia is a 75 y.o. female who presents for establishing with cardiology care  Patient has known CAD status post stenting in the past. Her last stress test was in 2014. Since then, she has noted worsening shortness of breath. Symptoms mainly in the chest this is associated with some chest tightness mild in intensity. Chest tightness is nonradiating both symptoms of shortness of breath and chest tightness occur with exertion. Minutes at a time, resolved at rest. She has noticed progression of symptoms since 2014.  In terms of atrial fibrillation, she mentions having atrial fibrillation ablation done in 2013 but she has been on chronic warfarin therapy and is interested in switching to oral anticoagulation. She is just concerned about the cost on her part.  In terms of bradycardia, she is unable to use a beta blocker due to significant slowing of her heart rate. That is why she ended up with ablation for her atrial fibrillation.  No fever, cough, colds, abdominal pain. No PND, orthopnea, edema. No loss of consciousness. No palpitations.   ROS:  Please see the history of present illness. Aside from mentioned under HPI, all other systems are reviewed and negative.     Past Medical History  Diagnosis Date  . Thyroid disease   . Vitamin D deficiency   . Vitamin B12 deficiency   . Diabetes mellitus without complication ([place])   . GERD (gastroesophageal reflux disease)   . Hypokalemia   . Macular degeneration   . Hypocalcemia   . Hyperlipidemia   . COPD (chronic obstructive pulmonary disease) ([place])   . CAD (coronary artery disease)   . Atrial fibrillation  [name])     Past Surgical History  Procedure Laterality Date  . Vaginal hysterectomy    . Thyroid surgery    . Parathyroid surgery      x 2  . Cholecystectomy    . Total shoulder arthroplasty Right     x 2  . Colonoscopy  2015    1 polyp removed- repeat 3 years  . Coronary angioplasty with stent placement       reports that she has never smoked. She does not have any smokeless tobacco history on file. She reports that she drinks alcohol. She reports that she does not use illicit drugs.   family history is not on file.   Current Outpatient Prescriptions  Medication Sig Dispense Refill  . allopurinol (ZYLOPRIM) 300 MG tablet Take 300 mg by mouth daily.    Marland Kitchen aspirin 81 MG tablet Take 81 mg by mouth daily.    . B Complex Vitamins (VITAMIN-B COMPLEX) TABS Take 1 tablet by mouth daily.    . calcium carbonate (CALCIUM 600) 600 MG TABS tablet Take 600 mg by mouth daily with breakfast.     . Cholecalciferol (VITAMIN D3) 5000 units TABS Take 1 tablet by mouth every morning.    . Coenzyme Q10 (COQ10) 200 MG CAPS Take 1 capsule by mouth daily.    . Cyanocobalamin (B-12 COMPLIANCE INJECTION IJ) Inject as directed every 30 (thirty) days.    . Ferrous Sulfate (IRON) 325 (65 Fe) MG TABS Take 1 tablet  by mouth daily.    . furosemide (LASIX) 20 MG tablet Take 40mg  by mouth every other day. Take 60mg  by mouth every other day. Alternating days    . lisinopril (PRINIVIL,ZESTRIL) 2.5 MG tablet Take 2.5 mg by mouth daily.    . Magnesium 500 MG TABS Take 1 tablet by mouth daily.    . Melatonin 5 MG TABS Take 1 tablet by mouth at bedtime.    . metFORMIN (GLUCOPHAGE) 500 MG tablet Take 500 mg by mouth 2 (two) times daily with a meal.    . Multiple Vitamins-Minerals (PRESERVISION AREDS 2 PO) Take by mouth at bedtime.    . pantoprazole (PROTONIX) 40 MG tablet Take 40 mg by mouth daily.    . potassium chloride (KLOR-CON) 8 MEQ tablet Take 8 mEq by mouth 2 (two) times daily.    . pravastatin (PRAVACHOL) 40 MG  tablet Take 40 mg by mouth daily.    . sitaGLIPtin (JANUVIA) 100 MG tablet Take 100 mg by mouth daily.    . Thyroid (NATURE-THROID) 113.75 MG TABS Take 1 tablet by mouth at bedtime.    . vitamin B-12 (CYANOCOBALAMIN) 500 MCG tablet Take 500 mcg by mouth daily.    Marland Kitchen warfarin (COUMADIN) 7.5 MG tablet Take 7.5 mg by mouth daily.     No current facility-administered medications for this visit.    Allergies: Demerol; Sulfa antibiotics; and Tetracyclines & related    PHYSICAL EXAM: VS:  BP 124/76 mmHg  Pulse 74  Ht 5\' 3"  (1.6 m)  Wt 213 lb (96.616 kg)  BMI 37.74 kg/m2 , Body mass index is 37.74 kg/(m^2). Wt Readings from Last 3 Encounters:  08/20/15 213 lb (96.616 kg)  08/19/15 212 lb (96.163 kg)    GENERAL:  well developed, well nourished, obese, not in acute distress HEENT: normocephalic, pink conjunctivae, anicteric sclerae, no xanthelasma, normal dentition, oropharynx clear NECK:  no neck vein engorgement, JVP normal, no hepatojugular reflux, carotid upstroke brisk and symmetric, no bruit, no thyromegaly, no lymphadenopathy LUNGS:  good respiratory effort, clear to auscultation bilaterally CV:  PMI not displaced, no thrills, no lifts, S1 and S2 within normal limits, no palpable S3 or S4, no murmurs, no rubs, no gallops ABD:  Soft, nontender, nondistended, normoactive bowel sounds, no abdominal aortic bruit, no hepatomegaly, no splenomegaly MS: nontender back, no kyphosis, no scoliosis, no joint deformities EXT:  2+ DP/PT pulses, no edema, no varicosities, no cyanosis, no clubbing SKIN: warm, nondiaphoretic, normal turgor, no ulcers NEUROPSYCH: alert, oriented to person, place, and time, sensory/motor grossly intact, normal mood, appropriate affect  Recent Labs: 08/19/2015: ALT 21; BUN 23; Creatinine, Ser 1.13*; Platelets 223; Potassium 4.0; Sodium 145*; TSH 0.229*   Lipid Panel No results found for: CHOL, TRIG, HDL, CHOLHDL, VLDL, LDLCALC, LDLDIRECT   Other studies  Reviewed:  EKG:  The ekg from 08/20/2015 was personally reviewed by me and it revealed sinus rhythm 67 BPM. First-degree AV block PR interval 380 ms. QT within normal limits 420 ms/QTc 432 ms.  Additional studies/ records that were reviewed personally reviewed by me today include:  Outside records Pharmacologic nuclear stress test 07/26/2012: No myocardial perfusion defects noted. EF 78%.  Echocardiogram 07/20/2012: EF 55-60% Mild concentric hypertrophy Grade 1 diastolic dysfunction Trace AI Trace MR Trace TR PA pressure 28 mmHg   ASSESSMENT AND PLAN: CAD status post stent placement circumflex artery 07/02/2010 Xience drug-eluting stent 2.75 x 12 mm Patient complaining of chest tightness and shortness of breath Recommend further investigation with pharmacologic nuclear stress  is an echocardiogram. In the meantime, continue medical therapy with aspirin 81 mg by mouth daily, ACE inhibitor lisinopril. Patient is also on Imdur as well as nitroglycerin sublingual. Continue statin therapy. Patient developed significant bradycardia with beta blockade and is not able to take that medication.  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 Discussed to consider switching to oral anticoagulant either Eliquis or Xarelto. Patient will get information about these for her to determine which one will be covered by her insurance. We will set her up with Coumadin clinic.  Hypertension BP is well controlled. Continue monitoring BP. Continue current medical therapy and lifestyle changes.  Hyperlipidemia Patient is establish care with PCP. According to her, lipid panel was drawn recently. Recommend LDL goal is less than 70 due to CAD.   Current medicines are reviewed at length with the patient today.  The patient does not have concerns regarding medicines.  Labs/ tests ordered today include:  Orders Placed This Encounter   Procedures  . NM Myocar Multi W/Spect W/Wall Motion / EF  . EKG 12-Lead  . ECHOCARDIOGRAM COMPLETE    I had a lengthy and detailed discussion with the patient regarding diagnoses, prognosis, diagnostic options, treatment options .   I counseled the patient on importance of lifestyle modification including heart healthy diet, regular physical activity once cardiac workup is complete.   Disposition:   FU with undersigned after tests   I spent at least 60 minutes with the patient today and more than 50% of the time was spent counseling the patient and coordinating care.     Signed, Wende Bushy, MD  08/20/2015 3:21 PM    [place]

## 2015-08-21 ENCOUNTER — Other Ambulatory Visit: Payer: Self-pay | Admitting: Family Medicine

## 2015-08-21 MED ORDER — LEVOTHYROXINE SODIUM 150 MCG PO TABS: 150.0000 ug | ORAL_TABLET | Freq: Every day | ORAL | Status: DC

## 2015-08-21 NOTE — Progress Notes (Signed)
Synthroid rx sent

## 2015-08-21 NOTE — Progress Notes (Signed)
Med review: consider d/c Ca++, co Q10, Mg++, and B complex next visit as no clear indication for use.

## 2015-08-21 NOTE — Addendum Note (Signed)
Addended by: Adline Potter on: 08/21/2015 12:43 PM   Modules accepted: Orders, Medications

## 2015-08-25 ENCOUNTER — Other Ambulatory Visit: Payer: Medicare Other

## 2015-08-26 ENCOUNTER — Telehealth: Payer: Self-pay | Admitting: Cardiology

## 2015-08-26 ENCOUNTER — Ambulatory Visit (INDEPENDENT_AMBULATORY_CARE_PROVIDER_SITE_OTHER): Payer: Medicare Other

## 2015-08-26 DIAGNOSIS — Z7189 Other specified counseling: Secondary | ICD-10-CM | POA: Insufficient documentation

## 2015-08-26 DIAGNOSIS — I4891 Unspecified atrial fibrillation: Secondary | ICD-10-CM

## 2015-08-26 DIAGNOSIS — Z7901 Long term (current) use of anticoagulants: Secondary | ICD-10-CM | POA: Diagnosis not present

## 2015-08-26 LAB — POCT INR: INR: 2

## 2015-08-26 NOTE — Telephone Encounter (Signed)
Pt c/o Shortness Of Breath: STAT if SOB developed within the last 24 hours or pt is noticeably SOB on the phone  1. Are you currently SOB (can you hear that pt is SOB on the phone)?yes  2. How long have you been experiencing SOB? Since this morning intermittently    3. Are you SOB when sitting or when up moving around?  Either but increased when moving   4. Are you currently experiencing any other symptoms?   Dizziness and lightheadedness   Thinks she may be having arrhythmia

## 2015-08-26 NOTE — Telephone Encounter (Signed)
Patient came in to have coumadin checked today and stated that she had some shortness of breath and dizziness. She states that she is feeling better right now but has had some episodes recently. Patients current heart rate is regular and rate of 57. She remains on coumadin and is also on lasix at home. She confirms that she is taking these as directed. She currently does not appear to be short of breath and denies it at this time. Reviewed with her that per her previous visit we are waiting on test results to see if we can determine what is making her feel bad. She has an upcoming stress test and echocardiogram. Let her know that I would call with results and that she also has a follow up appointment with [place] first part of July. Printed a schedule for her showing her upcoming appointments and she verbalized agreement with plan and had no further questions at this time. Instructed her to call back if she has any further problems or questions.

## 2015-08-27 ENCOUNTER — Telehealth: Payer: Self-pay

## 2015-08-27 MED ORDER — SITAGLIPTIN PHOSPHATE 100 MG PO TABS: 100.0000 mg | ORAL_TABLET | Freq: Every day | ORAL | Status: DC

## 2015-08-27 NOTE — Telephone Encounter (Signed)
Januvia refill sent to [facility], only pharmacy in chart. If needs sent elsewhere please advise.

## 2015-08-27 NOTE — Telephone Encounter (Signed)
Sent to [facility] 

## 2015-08-27 NOTE — Addendum Note (Signed)
Addended by: Adline Potter on: 08/27/2015 03:36 PM   Modules accepted: Orders

## 2015-08-28 ENCOUNTER — Other Ambulatory Visit: Payer: Self-pay | Admitting: Family Medicine

## 2015-08-28 MED ORDER — LEVOTHYROXINE SODIUM 150 MCG PO TABS: 150.0000 ug | ORAL_TABLET | Freq: Every day | ORAL | Status: DC

## 2015-08-28 MED ORDER — SITAGLIPTIN PHOSPHATE 100 MG PO TABS: 100.0000 mg | ORAL_TABLET | Freq: Every day | ORAL | Status: DC

## 2015-08-28 MED ORDER — METFORMIN HCL 500 MG PO TABS: 500.0000 mg | ORAL_TABLET | Freq: Two times a day (BID) | ORAL | Status: DC

## 2015-08-28 MED ORDER — POTASSIUM CHLORIDE ER 8 MEQ PO TBCR: 8.0000 meq | EXTENDED_RELEASE_TABLET | Freq: Two times a day (BID) | ORAL | Status: DC

## 2015-08-28 MED ORDER — FUROSEMIDE 20 MG PO TABS: ORAL_TABLET | ORAL | Status: DC

## 2015-08-28 MED ORDER — WARFARIN SODIUM 7.5 MG PO TABS: 7.5000 mg | ORAL_TABLET | Freq: Every day | ORAL | Status: DC

## 2015-08-28 MED ORDER — ALLOPURINOL 100 MG PO TABS: 100.0000 mg | ORAL_TABLET | Freq: Every day | ORAL | Status: DC

## 2015-08-28 NOTE — Addendum Note (Signed)
Addended by: Adline Potter on: 08/28/2015 10:13 AM   Modules accepted: Orders

## 2015-08-28 NOTE — Telephone Encounter (Signed)
Rx sent to [facility].  

## 2015-08-28 NOTE — Addendum Note (Signed)
Addended by: Adline Potter on: 08/28/2015 04:43 PM   Modules accepted: Orders

## 2015-09-02 ENCOUNTER — Ambulatory Visit (INDEPENDENT_AMBULATORY_CARE_PROVIDER_SITE_OTHER): Payer: Medicare Other

## 2015-09-02 ENCOUNTER — Other Ambulatory Visit: Payer: Self-pay

## 2015-09-02 DIAGNOSIS — R079 Chest pain, unspecified: Secondary | ICD-10-CM | POA: Diagnosis not present

## 2015-09-02 DIAGNOSIS — I4891 Unspecified atrial fibrillation: Secondary | ICD-10-CM | POA: Diagnosis not present

## 2015-09-02 LAB — ECHOCARDIOGRAM COMPLETE
CHL CUP DOP CALC LVOT VTI: 25.4 cm
CHL CUP MV DEC (S): 343
CHL CUP STROKE VOLUME: 64 mL
E decel time: 343 msec
EERAT: 10.95
FS: 38 % (ref 28–44)
IVS/LV PW RATIO, ED: 0.85
LA diam end sys: 42 mm
LA diam index: 1.98 cm/m2
LA vol A4C: 63.6 ml
LASIZE: 42 mm
LAVOL: 68.7 mL
LAVOLIN: 32.4 mL/m2
LDCA: 3.8 cm2
LV E/e' medial: 10.95
LV SIMPSON'S DISK: 69
LV dias vol: 93 mL (ref 46–106)
LV sys vol index: 13 mL/m2
LV sys vol: 29 mL (ref 14–42)
LVDIAVOLIN: 44 mL/m2
LVEEAVG: 10.95
LVELAT: 7.8 cm/s
LVOT diameter: 22 mm
LVOTPV: 109 cm/s
LVOTSV: 97 mL
MV Peak grad: 3 mmHg
MV pk A vel: 92.2 m/s
MV pk E vel: 85.4 m/s
P 1/2 time: 1313 ms
PW: 12.4 mm — AB (ref 0.6–1.1)
RV TAPSE: 22 mm
TDI e' lateral: 7.8
TDI e' medial: 7.87

## 2015-09-03 ENCOUNTER — Telehealth: Payer: Self-pay | Admitting: Cardiology

## 2015-09-03 NOTE — Telephone Encounter (Signed)
Reviewed instructions, time, and location for stress test scheduled tomorrow. Patient verbalized understanding and had no further questions at this time.

## 2015-09-04 ENCOUNTER — Encounter
Admission: RE | Admit: 2015-09-04 | Discharge: 2015-09-04 | Disposition: A | Discharge status: Home / Self Care | Payer: Medicare Other | Source: Ambulatory Visit | Attending: Cardiology | Admitting: Cardiology

## 2015-09-04 DIAGNOSIS — R079 Chest pain, unspecified: Secondary | ICD-10-CM | POA: Diagnosis not present

## 2015-09-04 DIAGNOSIS — I4891 Unspecified atrial fibrillation: Secondary | ICD-10-CM | POA: Diagnosis not present

## 2015-09-04 LAB — NM MYOCAR MULTI W/SPECT W/WALL MOTION / EF
CHL CUP NUCLEAR SRS: 5
CHL CUP RESTING HR STRESS: 57 {beats}/min
CHL CUP STRESS STAGE 1 HR: 57 {beats}/min
CHL CUP STRESS STAGE 3 GRADE: 0 %
CHL CUP STRESS STAGE 3 SPEED: 0 mph
CHL CUP STRESS STAGE 4 GRADE: 0 %
CHL CUP STRESS STAGE 4 SPEED: 0 mph
CHL CUP STRESS STAGE 6 HR: 80 {beats}/min
CHL CUP STRESS STAGE 6 SBP: 145 mmHg
CHL CUP STRESS STAGE 6 SPEED: 0 mph
CSEPEDS: 0 s
CSEPEW: 1 METS
CSEPHR: 61 %
CSEPPHR: 77 {beats}/min
Exercise duration (min): 0 min
LVDIAVOL: 118 mL (ref 46–106)
LVSYSVOL: 45 mL
MPHR: 146 {beats}/min
NUC STRESS TID: 1.1
Percent of predicted max HR: 52 %
SDS: 13
SSS: 18
Stage 2 Grade: 0 %
Stage 2 HR: 58 {beats}/min
Stage 2 Speed: 0 mph
Stage 3 HR: 55 {beats}/min
Stage 4 HR: 77 {beats}/min
Stage 5 Grade: 0 %
Stage 5 HR: 83 {beats}/min
Stage 5 Speed: 0 mph
Stage 6 DBP: 72 mmHg
Stage 6 Grade: 0 %

## 2015-09-04 MED ORDER — REGADENOSON 0.4 MG/5ML IV SOLN
0.4000 mg | Freq: Once | INTRAVENOUS | Status: AC
  Administered 2015-09-04: 0.4 mg via INTRAVENOUS

## 2015-09-04 MED ORDER — TECHNETIUM TC 99M TETROFOSMIN IV KIT
13.0000 | PACK | Freq: Once | INTRAVENOUS | Status: AC | PRN
  Administered 2015-09-04: 12.58 via INTRAVENOUS

## 2015-09-04 MED ORDER — TECHNETIUM TC 99M TETROFOSMIN IV KIT
32.8470 | PACK | Freq: Once | INTRAVENOUS | Status: AC | PRN
  Administered 2015-09-04: 32.847 via INTRAVENOUS

## 2015-09-07 DIAGNOSIS — H353221 Exudative age-related macular degeneration, left eye, with active choroidal neovascularization: Secondary | ICD-10-CM | POA: Diagnosis not present

## 2015-09-07 DIAGNOSIS — H43813 Vitreous degeneration, bilateral: Secondary | ICD-10-CM | POA: Diagnosis not present

## 2015-09-07 DIAGNOSIS — H353113 Nonexudative age-related macular degeneration, right eye, advanced atrophic without subfoveal involvement: Secondary | ICD-10-CM | POA: Diagnosis not present

## 2015-09-09 ENCOUNTER — Encounter: Payer: Self-pay | Admitting: Family Medicine

## 2015-09-09 ENCOUNTER — Ambulatory Visit (INDEPENDENT_AMBULATORY_CARE_PROVIDER_SITE_OTHER): Payer: Medicare Other | Admitting: *Deleted

## 2015-09-09 ENCOUNTER — Ambulatory Visit (INDEPENDENT_AMBULATORY_CARE_PROVIDER_SITE_OTHER): Payer: Medicare Other | Admitting: Family Medicine

## 2015-09-09 VITALS — BP 124/82 | HR 51 | Resp 16 | Ht 63.0 in | Wt 211.0 lb

## 2015-09-09 DIAGNOSIS — I4891 Unspecified atrial fibrillation: Secondary | ICD-10-CM

## 2015-09-09 DIAGNOSIS — I480 Paroxysmal atrial fibrillation: Secondary | ICD-10-CM | POA: Diagnosis not present

## 2015-09-09 DIAGNOSIS — E669 Obesity, unspecified: Secondary | ICD-10-CM | POA: Diagnosis not present

## 2015-09-09 DIAGNOSIS — E119 Type 2 diabetes mellitus without complications: Secondary | ICD-10-CM | POA: Diagnosis not present

## 2015-09-09 DIAGNOSIS — M1000 Idiopathic gout, unspecified site: Secondary | ICD-10-CM | POA: Diagnosis not present

## 2015-09-09 DIAGNOSIS — I2510 Atherosclerotic heart disease of native coronary artery without angina pectoris: Secondary | ICD-10-CM

## 2015-09-09 DIAGNOSIS — E039 Hypothyroidism, unspecified: Secondary | ICD-10-CM | POA: Diagnosis not present

## 2015-09-09 DIAGNOSIS — Z85820 Personal history of malignant melanoma of skin: Secondary | ICD-10-CM | POA: Insufficient documentation

## 2015-09-09 DIAGNOSIS — K222 Esophageal obstruction: Secondary | ICD-10-CM | POA: Diagnosis not present

## 2015-09-09 DIAGNOSIS — K219 Gastro-esophageal reflux disease without esophagitis: Secondary | ICD-10-CM

## 2015-09-09 DIAGNOSIS — N183 Chronic kidney disease, stage 3 unspecified: Secondary | ICD-10-CM

## 2015-09-09 DIAGNOSIS — Z7901 Long term (current) use of anticoagulants: Secondary | ICD-10-CM | POA: Diagnosis not present

## 2015-09-09 DIAGNOSIS — E890 Postprocedural hypothyroidism: Secondary | ICD-10-CM | POA: Insufficient documentation

## 2015-09-09 DIAGNOSIS — J449 Chronic obstructive pulmonary disease, unspecified: Secondary | ICD-10-CM | POA: Diagnosis not present

## 2015-09-09 DIAGNOSIS — E538 Deficiency of other specified B group vitamins: Secondary | ICD-10-CM

## 2015-09-09 LAB — POCT INR: INR: 2.7

## 2015-09-10 ENCOUNTER — Other Ambulatory Visit: Payer: Self-pay | Admitting: Family Medicine

## 2015-09-10 DIAGNOSIS — E538 Deficiency of other specified B group vitamins: Secondary | ICD-10-CM

## 2015-09-10 DIAGNOSIS — I25118 Atherosclerotic heart disease of native coronary artery with other forms of angina pectoris: Secondary | ICD-10-CM | POA: Insufficient documentation

## 2015-09-10 DIAGNOSIS — I2510 Atherosclerotic heart disease of native coronary artery without angina pectoris: Secondary | ICD-10-CM | POA: Insufficient documentation

## 2015-09-10 HISTORY — DX: Deficiency of other specified B group vitamins: E53.8

## 2015-09-10 LAB — BASIC METABOLIC PANEL
BUN / CREAT RATIO: 17 (ref 12–28)
BUN: 18 mg/dL (ref 8–27)
CHLORIDE: 101 mmol/L (ref 96–106)
CO2: 22 mmol/L (ref 18–29)
Calcium: 9.3 mg/dL (ref 8.7–10.3)
Creatinine, Ser: 1.04 mg/dL — ABNORMAL HIGH (ref 0.57–1.00)
GFR calc Af Amer: 61 mL/min/{1.73_m2} (ref >59)
GFR calc non Af Amer: 53 mL/min/{1.73_m2} — ABNORMAL LOW (ref >59)
GLUCOSE: 132 mg/dL — AB (ref 65–99)
Potassium: 4.6 mmol/L (ref 3.5–5.2)
SODIUM: 141 mmol/L (ref 134–144)

## 2015-09-10 LAB — TSH+T4F+T3FREE
FREE T4: 2.18 ng/dL — AB (ref 0.82–1.77)
T3, Free: 2.8 pg/mL (ref 2.0–4.4)
TSH: 0.079 u[IU]/mL — AB (ref 0.450–4.500)

## 2015-09-10 LAB — LIPID PANEL
CHOLESTEROL TOTAL: 200 mg/dL — AB (ref 100–199)
Chol/HDL Ratio: 4.5 ratio units — ABNORMAL HIGH (ref 0.0–4.4)
HDL: 44 mg/dL (ref >39)
Triglycerides: 418 mg/dL — ABNORMAL HIGH (ref 0–149)

## 2015-09-10 MED ORDER — LEVOTHYROXINE SODIUM 125 MCG PO TABS: 125.0000 ug | ORAL_TABLET | Freq: Every day | ORAL | Status: DC

## 2015-09-10 NOTE — Progress Notes (Signed)
Date:  09/09/2015   Name:  Dawn Garcia   DOB:  [birth year]   MRN:  [id number]  PCP:  Adline Potter, MD    Chief Complaint: Diabetes; Hypertension; Hypothyroidism; Gastroesophageal Reflux; and COPD   History of Present Illness:  This is a 75 y.o. female seen in 3 week f/u from initial visit. Blood work showed B12 and thyroid levels high, B12 injections stopped and change to decreased dose Synthroid (was on [facility]). Reports endo in [place] told low TSH ok as long as T3/T4 levels ok. CKD3 also noted, allopurinol dose decreased and asa/iron stopped. Remains on warfarin for Afib, Coumadin clinic following, saw cards, told stress test and echo ok. BG's 150-170 fasting, a1c 6.9%. Concerned re: recent intermittent dyspnea, nonsmoker but sign second hand smoke exposure, had CT chest in April showing possible COPD and scattered mediastinal lymph nodes. PPI taper unsuccessful due to sx recurrence. Requests derm referral for hx melanoma, was followed by derm in [place] before move.  Review of Systems:  Review of Systems  Constitutional: Negative for fever and fatigue.  Respiratory: Negative for cough.   Cardiovascular: Negative for chest pain and leg swelling.  Endocrine: Negative for polyuria.  Genitourinary: Negative for difficulty urinating.  Neurological: Negative for syncope and light-headedness.    Patient Active Problem List   Diagnosis Date Noted  . History of melanoma 09/09/2015  . COPD (chronic obstructive pulmonary disease) ([place]) 09/09/2015  . Hypothyroidism (acquired) 09/09/2015  . Long term (current) use of anticoagulants [Z79.01] 08/26/2015  . Obesity, Class II, BMI 35-39.9 08/20/2015  . GERD with stricture 08/20/2015  . Macular degeneration 08/20/2015  . CHF (congestive heart failure) ([place]) 08/20/2015  . History of parathyroidectomy 08/20/2015  . History of radioactive iodine thyroid ablation 08/20/2015  . CKD (chronic kidney disease) stage 3, GFR 30-59 ml/min 08/20/2015   . Atrial fibrillation ([place]) 08/19/2015  . Diabetes mellitus type 2, controlled, without complications ([place]) 0000000  . Gout 08/19/2015    Prior to Admission medications   Medication Sig Start Date End Date Taking? Authorizing Provider  allopurinol (ZYLOPRIM) 100 MG tablet Take 1 tablet (100 mg total) by mouth daily. 08/28/15  Yes Adline Potter, MD  B Complex Vitamins (VITAMIN-B COMPLEX) TABS Take 1 tablet by mouth daily.   Yes Historical Provider, MD  calcium carbonate (CALCIUM 600) 600 MG TABS tablet Take 600 mg by mouth daily with breakfast.    Yes Historical Provider, MD  Cholecalciferol (VITAMIN D3) 5000 units TABS Take 1 tablet by mouth every morning.   Yes Historical Provider, MD  Coenzyme Q10 (COQ10) 200 MG CAPS Take 1 capsule by mouth daily.   Yes Historical Provider, MD  furosemide (LASIX) 20 MG tablet Take 40mg  by mouth every other day alternating with 60mg  by mouth every other day. 08/28/15  Yes Adline Potter, MD  Isosorbide Mononitrate (IMDUR PO) Take 1 tablet by mouth daily.   Yes Historical Provider, MD  levothyroxine (SYNTHROID, LEVOTHROID) 150 MCG tablet Take 1 tablet (150 mcg total) by mouth daily. 08/28/15  Yes Adline Potter, MD  lisinopril (PRINIVIL,ZESTRIL) 2.5 MG tablet Take 2.5 mg by mouth daily.   Yes Historical Provider, MD  Magnesium 500 MG TABS Take 1 tablet by mouth daily.   Yes Historical Provider, MD  Melatonin 5 MG TABS Take 1 tablet by mouth at bedtime.   Yes Historical Provider, MD  metFORMIN (GLUCOPHAGE) 500 MG tablet Take 1 tablet (500 mg total) by mouth 2 (two) times daily with a meal. 08/28/15  Yes Gwyndolyn Saxon  Sharry Beining, MD  Multiple Vitamins-Minerals (PRESERVISION AREDS 2 PO) Take by mouth at bedtime.   Yes Historical Provider, MD  nitroGLYCERIN (NITROSTAT) 0.4 MG SL tablet Place 0.4 mg under the tongue every 5 (five) minutes as needed for chest pain.   Yes Historical Provider, MD  pantoprazole (PROTONIX) 40 MG tablet Take 40 mg by mouth daily.   Yes Historical  Provider, MD  potassium chloride (KLOR-CON) 8 MEQ tablet Take 1 tablet (8 mEq total) by mouth 2 (two) times daily. 08/28/15  Yes Adline Potter, MD  pravastatin (PRAVACHOL) 40 MG tablet Take 40 mg by mouth daily.   Yes Historical Provider, MD  sitaGLIPtin (JANUVIA) 100 MG tablet Take 1 tablet (100 mg total) by mouth daily. 08/28/15  Yes Adline Potter, MD  tobramycin (TOBREX) 0.3 % ophthalmic solution  09/07/15  Yes Historical Provider, MD  vitamin B-12 (CYANOCOBALAMIN) 500 MCG tablet Take 500 mcg by mouth daily.   Yes Historical Provider, MD  warfarin (COUMADIN) 7.5 MG tablet Take 1 tablet (7.5 mg total) by mouth daily. 08/28/15  Yes Adline Potter, MD    Allergies  Allergen Reactions  . Demerol [Meperidine] Anaphylaxis  . Sulfa Antibiotics Anaphylaxis  . Tetracyclines & Related     Made nose, lips,  And tongue itchy    Past Surgical History  Procedure Laterality Date  . Vaginal hysterectomy    . Thyroid surgery    . Parathyroid surgery      x 2  . Cholecystectomy    . Total shoulder arthroplasty Right     x 2  . Colonoscopy  2015    1 polyp removed- repeat 3 years  . Coronary angioplasty with stent placement      Social History  Substance Use Topics  . Smoking status: Never Smoker   . Smokeless tobacco: None  . Alcohol Use: 0.0 oz/week    0 Standard drinks or equivalent per week    History reviewed. No pertinent family history.  Medication list has been reviewed and updated.  Physical Examination: BP 124/82 mmHg  Pulse 51  Resp 16  Ht 5\' 3"  (1.6 m)  Wt 211 lb (95.709 kg)  BMI 37.39 kg/m2  SpO2 95%  Physical Exam  Constitutional: She appears well-developed and well-nourished.  Cardiovascular: Normal rate, regular rhythm and normal heart sounds.   Pulmonary/Chest: Effort normal and breath sounds normal.  Musculoskeletal: She exhibits no edema.  Neurological: She is alert.  Skin: Skin is warm and dry.  Psychiatric: She has a normal mood and affect. Her behavior is  normal.  Nursing note and vitals reviewed.   Assessment and Plan:  1. Controlled type 2 diabetes mellitus without complication, without long-term current use of insulin ([place]) Well controlled on metformin/Januvia - Lipid Profile  2. Chronic obstructive pulmonary disease, unspecified COPD type ([place]) Per CT chest with intermittent dyspnea - Ambulatory referral to Pulmonology  3. Hypothyroidism (acquired) On decreased Synthroid dose - TSH+T4F+T3Free  4. CKD (chronic kidney disease) stage 3, GFR 30-59 ml/min Off asa - Basic Metabolic Panel (BMET)  5. History of melanoma - Ambulatory referral to Dermatology  6. Obesity, Class II, BMI 35-39.9 Weight stable, exercise/weight loss discussed  7. Idiopathic gout, unspecified chronicity, unspecified site On decreased allopurinol dose  8. Paroxysmal atrial fibrillation (HCC) On warfarin, followed by Coumadin clinic  9. Coronary artery disease involving native coronary artery of native heart without angina pectoris Followed by cards, recent stress test and echo ok, consider d/c Imdur in future  10. GERD with stricture  Intolerant PPI taper  11. Med review Consider d/c B complex, calcium, coQ10, magnesium as no clear indication for use  Return in about 6 months (around 03/10/2016).  Satira Anis. [facility]  09/10/2015

## 2015-09-11 DIAGNOSIS — R0602 Shortness of breath: Secondary | ICD-10-CM | POA: Diagnosis not present

## 2015-09-11 DIAGNOSIS — J8410 Pulmonary fibrosis, unspecified: Secondary | ICD-10-CM | POA: Diagnosis not present

## 2015-09-11 DIAGNOSIS — R590 Localized enlarged lymph nodes: Secondary | ICD-10-CM | POA: Diagnosis not present

## 2015-09-16 ENCOUNTER — Other Ambulatory Visit: Payer: Self-pay

## 2015-09-16 MED ORDER — LEVOTHYROXINE SODIUM 125 MCG PO TABS: 125.0000 ug | ORAL_TABLET | Freq: Every day | ORAL | Status: DC

## 2015-09-16 MED ORDER — ALLOPURINOL 100 MG PO TABS: 100.0000 mg | ORAL_TABLET | Freq: Every day | ORAL | Status: DC

## 2015-09-16 MED ORDER — METFORMIN HCL 500 MG PO TABS: 500.0000 mg | ORAL_TABLET | Freq: Two times a day (BID) | ORAL | Status: DC

## 2015-09-16 MED ORDER — FUROSEMIDE 20 MG PO TABS: ORAL_TABLET | ORAL | Status: DC

## 2015-09-16 MED ORDER — POTASSIUM CHLORIDE ER 8 MEQ PO TBCR: 8.0000 meq | EXTENDED_RELEASE_TABLET | Freq: Two times a day (BID) | ORAL | Status: DC

## 2015-09-16 MED ORDER — SITAGLIPTIN PHOSPHATE 100 MG PO TABS: 100.0000 mg | ORAL_TABLET | Freq: Every day | ORAL | Status: DC

## 2015-09-17 ENCOUNTER — Ambulatory Visit: Payer: Medicare Other | Admitting: Cardiology

## 2015-09-18 ENCOUNTER — Ambulatory Visit (INDEPENDENT_AMBULATORY_CARE_PROVIDER_SITE_OTHER): Payer: Medicare Other | Admitting: Cardiovascular Disease

## 2015-09-18 ENCOUNTER — Encounter: Payer: Self-pay | Admitting: Cardiovascular Disease

## 2015-09-18 ENCOUNTER — Institutional Professional Consult (permissible substitution): Payer: Medicare Other | Admitting: Cardiovascular Disease

## 2015-09-18 ENCOUNTER — Encounter (INDEPENDENT_AMBULATORY_CARE_PROVIDER_SITE_OTHER): Payer: Self-pay

## 2015-09-18 VITALS — BP 120/76 | HR 73 | Ht 63.0 in | Wt 212.0 lb

## 2015-09-18 DIAGNOSIS — I480 Paroxysmal atrial fibrillation: Secondary | ICD-10-CM

## 2015-09-18 DIAGNOSIS — E669 Obesity, unspecified: Secondary | ICD-10-CM | POA: Diagnosis not present

## 2015-09-18 DIAGNOSIS — I2510 Atherosclerotic heart disease of native coronary artery without angina pectoris: Secondary | ICD-10-CM

## 2015-09-18 DIAGNOSIS — E119 Type 2 diabetes mellitus without complications: Secondary | ICD-10-CM | POA: Diagnosis not present

## 2015-09-18 DIAGNOSIS — R001 Bradycardia, unspecified: Secondary | ICD-10-CM

## 2015-09-18 DIAGNOSIS — R0602 Shortness of breath: Secondary | ICD-10-CM

## 2015-09-18 DIAGNOSIS — E1169 Type 2 diabetes mellitus with other specified complication: Secondary | ICD-10-CM | POA: Insufficient documentation

## 2015-09-18 DIAGNOSIS — E785 Hyperlipidemia, unspecified: Secondary | ICD-10-CM

## 2015-09-18 MED ORDER — ROSUVASTATIN CALCIUM 40 MG PO TABS: 40.0000 mg | ORAL_TABLET | Freq: Every day | ORAL | Status: DC

## 2015-09-18 NOTE — Patient Instructions (Addendum)
Monitor your heart rate when you have shortness of breath spells Look for atrial fib If you want a 30 day monitor for atrial fib, call the office   Medication Instructions:   If you get tachycardia with albuterol inhaler, Call the office We could change to xopenex inhaler  Please stop the pravastatin, Start crestor one a day  Labwork:  No new labs  Testing/Procedures:  No new testing  Follow-Up: It was a pleasure seeing you in the office today. Please call us if you have new issues that need to be addressed before your next appt.  [phone]  Your physician wants you to follow-up in: 6 months.  You will receive a reminder letter in the mail two months in advance. If you don't receive a letter, please call our office to schedule the follow-up appointment.  If you need a refill on your cardiac medications before your next appointment, please call your pharmacy.

## 2015-09-18 NOTE — Progress Notes (Signed)
Patient ID: Dawn Garcia, female   DOB: June 10, 1940, 75 y.o.   MRN: [id number] Cardiology Office Note  Date:  09/18/2015   ID:  Dawn Garcia, DOB 03/25/1940, MRN [id number]  PCP:  Adline Potter, MD   Chief Complaint  Patient presents with  . Other    C/o sob and chest pain. Meds reviewed verbally with pt.    HPI:  Dawn Garcia is a pleasant 75 year old woman with a history of coronary artery disease, stent in January 2010 (location unclear), follow-up catheterization 07/26/2012 showing stable mild coronary artery disease, patent stent, paroxysmal atrial fibrillation with history of ablation, who presents for routine follow-up for symptoms of shortness of breath  Recent echocardiogram and stress test results discussed with her Normal LV function, no ischemia on stress test  She reports having episode of shortness of breath 2 weeks ago. Woke up with symptoms, lasted until after lunch, severe symptoms, sometimes with lightheadedness. Felt her pulse was erratic, mildly elevated  She has seen pulmonary, received albuterol for shortness of breath symptoms Takes it might be helping Denies any chest tightness concerning for angina  EKG on today's visit shows normal sinus rhythm with rate 73 bpm, no significant ST or T-wave changes    PMH:   has a past medical history of Thyroid disease; Vitamin D deficiency; Vitamin B12 deficiency; Diabetes mellitus without complication ([place]); GERD (gastroesophageal reflux disease); Hypokalemia; Macular degeneration; Hypocalcemia; Hyperlipidemia; COPD (chronic obstructive pulmonary disease) ([place]); CAD (coronary artery disease); and Atrial fibrillation ([place]).  PSH:    Past Surgical History  Procedure Laterality Date  . Vaginal hysterectomy    . Thyroid surgery    . Parathyroid surgery      x 2  . Cholecystectomy    . Total shoulder arthroplasty Right     x 2  . Colonoscopy  2015    1 polyp removed- repeat 3 years  . Coronary angioplasty with  stent placement      Current Outpatient Prescriptions  Medication Sig Dispense Refill  . allopurinol (ZYLOPRIM) 100 MG tablet Take 1 tablet (100 mg total) by mouth daily. 90 tablet 1  . Cholecalciferol (VITAMIN D3) 5000 units TABS Take 1 tablet by mouth every morning.    . furosemide (LASIX) 20 MG tablet Take 40mg  by mouth every other day alternating with 60mg  by mouth every other day. 225 tablet 1  . Isosorbide Mononitrate (IMDUR PO) Take 1 tablet by mouth daily. 30 mg tablet    . levothyroxine (SYNTHROID, LEVOTHROID) 125 MCG tablet Take 1 tablet (125 mcg total) by mouth daily. 90 tablet 1  . lisinopril (PRINIVIL,ZESTRIL) 2.5 MG tablet Take 2.5 mg by mouth daily.    . Melatonin 5 MG TABS Take 1 tablet by mouth at bedtime.    . metFORMIN (GLUCOPHAGE) 500 MG tablet Take 1 tablet (500 mg total) by mouth 2 (two) times daily with a meal. 180 tablet 1  . Multiple Vitamins-Minerals (PRESERVISION AREDS 2 PO) Take by mouth at bedtime.    . nitroGLYCERIN (NITROSTAT) 0.4 MG SL tablet Place 0.4 mg under the tongue every 5 (five) minutes as needed for chest pain.    . pantoprazole (PROTONIX) 40 MG tablet Take 40 mg by mouth daily.    . potassium chloride (KLOR-CON) 8 MEQ tablet Take 1 tablet (8 mEq total) by mouth 2 (two) times daily. 180 tablet 1  . sitaGLIPtin (JANUVIA) 100 MG tablet Take 1 tablet (100 mg total) by mouth daily. 90 tablet 1  . tobramycin (TOBREX) 0.3 %  ophthalmic solution     . vitamin B-12 (CYANOCOBALAMIN) 500 MCG tablet Take 500 mcg by mouth daily.    Marland Kitchen warfarin (COUMADIN) 7.5 MG tablet Take 1 tablet (7.5 mg total) by mouth daily. 90 tablet 3  . rosuvastatin (CRESTOR) 40 MG tablet Take 1 tablet (40 mg total) by mouth daily. 30 tablet 11   No current facility-administered medications for this visit.     Allergies:   Demerol; Sulfa antibiotics; and Tetracyclines & related   Social History:  The patient  reports that she has never smoked. She does not have any smokeless tobacco  history on file. She reports that she drinks alcohol. She reports that she does not use illicit drugs.   Family History:   family history is not on file.    Review of Systems: Review of Systems  Constitutional: Negative.   Respiratory: Positive for shortness of breath.   Cardiovascular: Positive for palpitations.  Gastrointestinal: Negative.   Musculoskeletal: Negative.   Neurological: Negative.   Psychiatric/Behavioral: Negative.   All other systems reviewed and are negative.    PHYSICAL EXAM: VS:  BP 120/76 mmHg  Pulse 73  Ht 5\' 3"  (1.6 m)  Wt 212 lb (96.163 kg)  BMI 37.56 kg/m2 , BMI Body mass index is 37.56 kg/(m^2). GEN: Well nourished, well developed, in no acute distress, obese HEENT: normal Neck: no JVD, carotid bruits, or masses Cardiac: RRR; no murmurs, rubs, or gallops,no edema  Respiratory:  clear to auscultation bilaterally, normal work of breathing GI: soft, nontender, nondistended, + BS Dawn: no deformity or atrophy Skin: warm and dry, no rash Neuro:  Strength and sensation are intact Psych: euthymic mood, full affect    Recent Labs: 08/19/2015: ALT 21; Platelets 223 09/09/2015: BUN 18; Creatinine, Ser 1.04*; Potassium 4.6; Sodium 141; TSH 0.079*    Lipid Panel Lab Results  Component Value Date   CHOL 200* 09/09/2015   HDL 44 09/09/2015   LDLCALC Comment 09/09/2015   TRIG 418* 09/09/2015      Wt Readings from Last 3 Encounters:  09/18/15 212 lb (96.163 kg)  09/09/15 211 lb (95.709 kg)  08/20/15 213 lb (96.616 kg)       ASSESSMENT AND PLAN:  Paroxysmal atrial fibrillation (HCC) - Plan: EKG 12-Lead Episodes of shortness of breath that percent acutely lasting for hours associated with dizziness. Unable to exclude paroxysmal atrial fibrillation We have offered 30 day monitor. She has declined at this time but will call our office if she has recurrent symptoms  Shortness of breath Likely multifactorial Recently started on albuterol which she  thinks helps. Pulse is irregular, sometimes also with lightheadedness. Unable to exclude atrial fibrillation. Unable to tolerate rate controlling medications in the past secondary to bradycardia  Obesity, Class II, BMI 35-39.9 We have encouraged continued exercise, careful diet management in an effort to lose weight.  Controlled type 2 diabetes mellitus without complication, without long-term current use of insulin (HCC) Hemoglobin A1c 6.9, results discussed with her Recommended low carbohydrate diet  Coronary artery disease involving native coronary artery of native heart without angina pectoris Previous catheterization from 2014 discussed with her in detail, report pulled up in the office Reported not detail location of her prior stent placed 2010 Currently with no symptoms of angina. No further workup at this time. Continue current medication regimen. Recent stress test reviewed with her showing no significant ischemia  Hyperlipidemia Cholesterol above goal, recommended she stop the pravastatin, start Crestor 40 mg daily Goal LDL less than 70  Bradycardia Previous history of bradycardia, asymptomatic She reports unable to take rate or rhythm controlling medications on a regular basis given her bradycardia Often rate in the low 50s   Total encounter time more than 25 minutes  Greater than 50% was spent in counseling and coordination of care with the patient   Disposition:   F/U  6 months   Orders Placed This Encounter  Procedures  . EKG 12-Lead     Signed, Esmond Plants, M.D., Ph.D. 09/18/2015  [place], [place]

## 2015-09-22 ENCOUNTER — Other Ambulatory Visit: Payer: Self-pay

## 2015-09-22 MED ORDER — LEVOTHYROXINE SODIUM 125 MCG PO TABS: 125.0000 ug | ORAL_TABLET | Freq: Every day | ORAL | Status: DC

## 2015-09-29 DIAGNOSIS — H26491 Other secondary cataract, right eye: Secondary | ICD-10-CM | POA: Diagnosis not present

## 2015-10-06 ENCOUNTER — Other Ambulatory Visit: Payer: Self-pay

## 2015-10-06 MED ORDER — ISOSORBIDE MONONITRATE ER 30 MG PO TB24: 30.0000 mg | ORAL_TABLET | Freq: Every day | ORAL | 0 refills | Status: DC

## 2015-10-07 ENCOUNTER — Ambulatory Visit (INDEPENDENT_AMBULATORY_CARE_PROVIDER_SITE_OTHER): Payer: Medicare Other

## 2015-10-07 DIAGNOSIS — I4891 Unspecified atrial fibrillation: Secondary | ICD-10-CM

## 2015-10-07 DIAGNOSIS — Z7901 Long term (current) use of anticoagulants: Secondary | ICD-10-CM

## 2015-10-07 LAB — POCT INR: INR: 3

## 2015-10-08 ENCOUNTER — Other Ambulatory Visit: Payer: Self-pay

## 2015-10-13 ENCOUNTER — Telehealth: Payer: Self-pay | Admitting: Cardiovascular Disease

## 2015-10-13 ENCOUNTER — Other Ambulatory Visit: Payer: Self-pay

## 2015-10-13 DIAGNOSIS — I4891 Unspecified atrial fibrillation: Secondary | ICD-10-CM

## 2015-10-13 DIAGNOSIS — Z950 Presence of cardiac pacemaker: Secondary | ICD-10-CM

## 2015-10-13 HISTORY — DX: Presence of cardiac pacemaker: Z95.0

## 2015-10-13 NOTE — Telephone Encounter (Signed)
Spoke w/ pt.  Advised her that I have placed order, that she will receive a call from [employer] to verify her address before they mail monitor to her home.   Asked her to call back if we can be of further assistance.

## 2015-10-13 NOTE — Telephone Encounter (Signed)
Patient wants to wear holter monitor Dr. Rockey Situ suggested at [facility]

## 2015-10-16 ENCOUNTER — Other Ambulatory Visit: Payer: Self-pay

## 2015-10-16 MED ORDER — PANTOPRAZOLE SODIUM 40 MG PO TBEC: 40.0000 mg | DELAYED_RELEASE_TABLET | Freq: Every day | ORAL | 1 refills | Status: DC

## 2015-10-17 ENCOUNTER — Encounter (INDEPENDENT_AMBULATORY_CARE_PROVIDER_SITE_OTHER): Payer: Medicare Other

## 2015-10-17 DIAGNOSIS — I4891 Unspecified atrial fibrillation: Secondary | ICD-10-CM

## 2015-10-22 DIAGNOSIS — H43813 Vitreous degeneration, bilateral: Secondary | ICD-10-CM | POA: Diagnosis not present

## 2015-10-22 DIAGNOSIS — H353221 Exudative age-related macular degeneration, left eye, with active choroidal neovascularization: Secondary | ICD-10-CM | POA: Diagnosis not present

## 2015-10-22 DIAGNOSIS — H353113 Nonexudative age-related macular degeneration, right eye, advanced atrophic without subfoveal involvement: Secondary | ICD-10-CM | POA: Diagnosis not present

## 2015-11-04 ENCOUNTER — Ambulatory Visit (INDEPENDENT_AMBULATORY_CARE_PROVIDER_SITE_OTHER): Payer: Medicare Other

## 2015-11-04 DIAGNOSIS — Z7901 Long term (current) use of anticoagulants: Secondary | ICD-10-CM

## 2015-11-04 DIAGNOSIS — I4891 Unspecified atrial fibrillation: Secondary | ICD-10-CM

## 2015-11-04 LAB — POCT INR: INR: 1.9

## 2015-11-05 DIAGNOSIS — R05 Cough: Secondary | ICD-10-CM | POA: Diagnosis not present

## 2015-11-05 DIAGNOSIS — J310 Chronic rhinitis: Secondary | ICD-10-CM | POA: Diagnosis not present

## 2015-11-05 DIAGNOSIS — G4733 Obstructive sleep apnea (adult) (pediatric): Secondary | ICD-10-CM | POA: Diagnosis not present

## 2015-11-05 DIAGNOSIS — J8410 Pulmonary fibrosis, unspecified: Secondary | ICD-10-CM | POA: Diagnosis not present

## 2015-11-05 DIAGNOSIS — R0602 Shortness of breath: Secondary | ICD-10-CM | POA: Diagnosis not present

## 2015-11-06 ENCOUNTER — Other Ambulatory Visit: Payer: Self-pay | Admitting: Specialist

## 2015-11-06 DIAGNOSIS — R05 Cough: Secondary | ICD-10-CM

## 2015-11-06 DIAGNOSIS — J8410 Pulmonary fibrosis, unspecified: Secondary | ICD-10-CM

## 2015-11-06 DIAGNOSIS — R059 Cough, unspecified: Secondary | ICD-10-CM

## 2015-11-06 DIAGNOSIS — R0602 Shortness of breath: Secondary | ICD-10-CM

## 2015-11-08 ENCOUNTER — Encounter (HOSPITAL_COMMUNITY): Payer: Self-pay | Admitting: Emergency Medicine

## 2015-11-08 ENCOUNTER — Telehealth: Payer: Self-pay | Admitting: Physician Assistant

## 2015-11-08 ENCOUNTER — Inpatient Hospital Stay (HOSPITAL_COMMUNITY)
Admission: EM | Admit: 2015-11-08 | Discharge: 2015-11-12 | DRG: 243 | Disposition: A | Discharge status: Home / Self Care | Payer: Medicare Other | Attending: Cardiology | Admitting: Cardiology

## 2015-11-08 ENCOUNTER — Emergency Department (HOSPITAL_COMMUNITY): Payer: Medicare Other

## 2015-11-08 DIAGNOSIS — Z7901 Long term (current) use of anticoagulants: Secondary | ICD-10-CM | POA: Diagnosis not present

## 2015-11-08 DIAGNOSIS — I495 Sick sinus syndrome: Secondary | ICD-10-CM | POA: Diagnosis not present

## 2015-11-08 DIAGNOSIS — J449 Chronic obstructive pulmonary disease, unspecified: Secondary | ICD-10-CM | POA: Diagnosis present

## 2015-11-08 DIAGNOSIS — I2510 Atherosclerotic heart disease of native coronary artery without angina pectoris: Secondary | ICD-10-CM | POA: Diagnosis present

## 2015-11-08 DIAGNOSIS — E785 Hyperlipidemia, unspecified: Secondary | ICD-10-CM | POA: Diagnosis present

## 2015-11-08 DIAGNOSIS — Z7189 Other specified counseling: Secondary | ICD-10-CM

## 2015-11-08 DIAGNOSIS — Z886 Allergy status to analgesic agent status: Secondary | ICD-10-CM

## 2015-11-08 DIAGNOSIS — E890 Postprocedural hypothyroidism: Secondary | ICD-10-CM | POA: Diagnosis present

## 2015-11-08 DIAGNOSIS — Z7984 Long term (current) use of oral hypoglycemic drugs: Secondary | ICD-10-CM

## 2015-11-08 DIAGNOSIS — E1159 Type 2 diabetes mellitus with other circulatory complications: Secondary | ICD-10-CM

## 2015-11-08 DIAGNOSIS — Z881 Allergy status to other antibiotic agents status: Secondary | ICD-10-CM

## 2015-11-08 DIAGNOSIS — E119 Type 2 diabetes mellitus without complications: Secondary | ICD-10-CM | POA: Diagnosis present

## 2015-11-08 DIAGNOSIS — T82120A Displacement of cardiac electrode, initial encounter: Secondary | ICD-10-CM | POA: Diagnosis not present

## 2015-11-08 DIAGNOSIS — I480 Paroxysmal atrial fibrillation: Secondary | ICD-10-CM | POA: Diagnosis present

## 2015-11-08 DIAGNOSIS — I498 Other specified cardiac arrhythmias: Secondary | ICD-10-CM | POA: Diagnosis not present

## 2015-11-08 DIAGNOSIS — R42 Dizziness and giddiness: Secondary | ICD-10-CM

## 2015-11-08 DIAGNOSIS — R001 Bradycardia, unspecified: Secondary | ICD-10-CM | POA: Diagnosis not present

## 2015-11-08 DIAGNOSIS — I442 Atrioventricular block, complete: Secondary | ICD-10-CM | POA: Diagnosis present

## 2015-11-08 DIAGNOSIS — I499 Cardiac arrhythmia, unspecified: Secondary | ICD-10-CM | POA: Diagnosis not present

## 2015-11-08 DIAGNOSIS — Z87892 Personal history of anaphylaxis: Secondary | ICD-10-CM

## 2015-11-08 DIAGNOSIS — R008 Other abnormalities of heart beat: Secondary | ICD-10-CM | POA: Diagnosis not present

## 2015-11-08 DIAGNOSIS — E1165 Type 2 diabetes mellitus with hyperglycemia: Secondary | ICD-10-CM

## 2015-11-08 DIAGNOSIS — Z955 Presence of coronary angioplasty implant and graft: Secondary | ICD-10-CM

## 2015-11-08 DIAGNOSIS — Y92230 Patient room in hospital as the place of occurrence of the external cause: Secondary | ICD-10-CM | POA: Diagnosis not present

## 2015-11-08 DIAGNOSIS — I455 Other specified heart block: Secondary | ICD-10-CM | POA: Diagnosis present

## 2015-11-08 DIAGNOSIS — I10 Essential (primary) hypertension: Secondary | ICD-10-CM | POA: Diagnosis present

## 2015-11-08 DIAGNOSIS — Z95818 Presence of other cardiac implants and grafts: Secondary | ICD-10-CM

## 2015-11-08 DIAGNOSIS — Z6836 Body mass index (BMI) 36.0-36.9, adult: Secondary | ICD-10-CM

## 2015-11-08 DIAGNOSIS — Z45018 Encounter for adjustment and management of other part of cardiac pacemaker: Secondary | ICD-10-CM

## 2015-11-08 DIAGNOSIS — K219 Gastro-esophageal reflux disease without esophagitis: Secondary | ICD-10-CM | POA: Diagnosis present

## 2015-11-08 DIAGNOSIS — Y710 Diagnostic and monitoring cardiovascular devices associated with adverse incidents: Secondary | ICD-10-CM | POA: Diagnosis not present

## 2015-11-08 DIAGNOSIS — Z950 Presence of cardiac pacemaker: Secondary | ICD-10-CM

## 2015-11-08 DIAGNOSIS — Z888 Allergy status to other drugs, medicaments and biological substances status: Secondary | ICD-10-CM

## 2015-11-08 DIAGNOSIS — Z882 Allergy status to sulfonamides status: Secondary | ICD-10-CM

## 2015-11-08 DIAGNOSIS — E669 Obesity, unspecified: Secondary | ICD-10-CM | POA: Diagnosis present

## 2015-11-08 HISTORY — DX: Sick sinus syndrome: I49.5

## 2015-11-08 HISTORY — DX: Presence of cardiac pacemaker: Z95.0

## 2015-11-08 LAB — CBC
HEMATOCRIT: 42.4 % (ref 36.0–46.0)
HEMOGLOBIN: 14 g/dL (ref 12.0–15.0)
MCH: 29.4 pg (ref 26.0–34.0)
MCHC: 33 g/dL (ref 30.0–36.0)
MCV: 89.1 fL (ref 78.0–100.0)
Platelets: 191 10*3/uL (ref 150–400)
RBC: 4.76 MIL/uL (ref 3.87–5.11)
RDW: 14.3 % (ref 11.5–15.5)
WBC: 7.6 10*3/uL (ref 4.0–10.5)

## 2015-11-08 LAB — BASIC METABOLIC PANEL
Anion gap: 9 (ref 5–15)
BUN: 14 mg/dL (ref 6–20)
CHLORIDE: 106 mmol/L (ref 101–111)
CO2: 25 mmol/L (ref 22–32)
Calcium: 9.5 mg/dL (ref 8.9–10.3)
Creatinine, Ser: 1.18 mg/dL — ABNORMAL HIGH (ref 0.44–1.00)
GFR calc non Af Amer: 44 mL/min — ABNORMAL LOW (ref >60)
GFR, EST AFRICAN AMERICAN: 51 mL/min — AB (ref >60)
Glucose, Bld: 190 mg/dL — ABNORMAL HIGH (ref 65–99)
POTASSIUM: 4 mmol/L (ref 3.5–5.1)
SODIUM: 140 mmol/L (ref 135–145)

## 2015-11-08 LAB — I-STAT TROPONIN, ED: Troponin i, poc: 0 ng/mL (ref 0.00–0.08)

## 2015-11-08 LAB — TSH: TSH: 0.061 u[IU]/mL — AB (ref 0.350–4.500)

## 2015-11-08 LAB — T4, FREE: FREE T4: 2.08 ng/dL — AB (ref 0.61–1.12)

## 2015-11-08 LAB — GLUCOSE, CAPILLARY: Glucose-Capillary: 143 mg/dL — ABNORMAL HIGH (ref 65–99)

## 2015-11-08 LAB — PROTIME-INR
INR: 1.73
Prothrombin Time: 20.4 seconds — ABNORMAL HIGH (ref 11.4–15.2)

## 2015-11-08 MED ORDER — HEPARIN (PORCINE) IN NACL 100-0.45 UNIT/ML-% IJ SOLN
1150.0000 [IU]/h | INTRAMUSCULAR | Status: DC
Start: 2015-11-08 — End: 2015-11-10
  Administered 2015-11-08 – 2015-11-09 (×2): 1150 [IU]/h via INTRAVENOUS
  Filled 2015-11-08 (×2): qty 250

## 2015-11-08 MED ORDER — LEVOTHYROXINE SODIUM 25 MCG PO TABS
125.0000 ug | ORAL_TABLET | Freq: Every day | ORAL | Status: DC
  Administered 2015-11-09 – 2015-11-12 (×4): 125 ug via ORAL
  Filled 2015-11-08 (×4): qty 1

## 2015-11-08 MED ORDER — ALLOPURINOL 100 MG PO TABS
100.0000 mg | ORAL_TABLET | Freq: Every day | ORAL | Status: DC
  Administered 2015-11-09 – 2015-11-12 (×4): 100 mg via ORAL
  Filled 2015-11-08 (×4): qty 1

## 2015-11-08 MED ORDER — ACETAMINOPHEN 325 MG PO TABS: 650.0000 mg | ORAL_TABLET | ORAL | Status: DC | PRN

## 2015-11-08 MED ORDER — LISINOPRIL 2.5 MG PO TABS
2.5000 mg | ORAL_TABLET | Freq: Every day | ORAL | Status: DC
  Filled 2015-11-08 (×4): qty 1

## 2015-11-08 MED ORDER — PRESERVISION AREDS 2 PO CAPS
ORAL_CAPSULE | Freq: Two times a day (BID) | ORAL | Status: DC
Start: 2015-11-08 — End: 2015-11-08

## 2015-11-08 MED ORDER — VITAMIN D 1000 UNITS PO TABS
5000.0000 [IU] | ORAL_TABLET | Freq: Every day | ORAL | Status: DC
  Administered 2015-11-09 – 2015-11-12 (×4): 5000 [IU] via ORAL
  Filled 2015-11-08 (×4): qty 5

## 2015-11-08 MED ORDER — ONDANSETRON HCL 4 MG/2ML IJ SOLN: 4.0000 mg | Freq: Four times a day (QID) | INTRAMUSCULAR | Status: DC | PRN

## 2015-11-08 MED ORDER — POTASSIUM CHLORIDE ER 8 MEQ PO TBCR
8.0000 meq | EXTENDED_RELEASE_TABLET | Freq: Two times a day (BID) | ORAL | Status: DC
  Administered 2015-11-08 – 2015-11-12 (×8): 8 meq via ORAL
  Filled 2015-11-08 (×8): qty 1

## 2015-11-08 MED ORDER — FUROSEMIDE 40 MG PO TABS
40.0000 mg | ORAL_TABLET | Freq: Every day | ORAL | Status: DC
  Administered 2015-11-09 – 2015-11-12 (×4): 40 mg via ORAL
  Filled 2015-11-08 (×4): qty 1

## 2015-11-08 MED ORDER — INSULIN ASPART 100 UNIT/ML ~~LOC~~ SOLN
0.0000 [IU] | Freq: Three times a day (TID) | SUBCUTANEOUS | Status: DC
  Administered 2015-11-09: 2 [IU] via SUBCUTANEOUS
  Administered 2015-11-09 – 2015-11-10 (×2): 3 [IU] via SUBCUTANEOUS
  Administered 2015-11-10: 2 [IU] via SUBCUTANEOUS
  Administered 2015-11-11 – 2015-11-12 (×3): 3 [IU] via SUBCUTANEOUS

## 2015-11-08 MED ORDER — LINAGLIPTIN 5 MG PO TABS
5.0000 mg | ORAL_TABLET | Freq: Every day | ORAL | Status: DC
Start: 2015-11-09 — End: 2015-11-12
  Administered 2015-11-09 – 2015-11-12 (×4): 5 mg via ORAL
  Filled 2015-11-08 (×4): qty 1

## 2015-11-08 MED ORDER — ROSUVASTATIN CALCIUM 40 MG PO TABS
40.0000 mg | ORAL_TABLET | Freq: Every day | ORAL | Status: DC
  Administered 2015-11-08 – 2015-11-12 (×5): 40 mg via ORAL
  Filled 2015-11-08 (×5): qty 1

## 2015-11-08 MED ORDER — ALPRAZOLAM 0.25 MG PO TABS: 0.2500 mg | ORAL_TABLET | Freq: Two times a day (BID) | ORAL | Status: DC | PRN

## 2015-11-08 MED ORDER — VITAMIN B-12 1000 MCG PO TABS
500.0000 ug | ORAL_TABLET | Freq: Every day | ORAL | Status: DC
  Administered 2015-11-09 – 2015-11-12 (×4): 500 ug via ORAL
  Filled 2015-11-08 (×4): qty 1

## 2015-11-08 MED ORDER — PANTOPRAZOLE SODIUM 40 MG PO TBEC
40.0000 mg | DELAYED_RELEASE_TABLET | Freq: Every day | ORAL | Status: DC
  Administered 2015-11-09 – 2015-11-12 (×4): 40 mg via ORAL
  Filled 2015-11-08 (×4): qty 1

## 2015-11-08 MED ORDER — ISOSORBIDE MONONITRATE ER 30 MG PO TB24
30.0000 mg | ORAL_TABLET | Freq: Every day | ORAL | Status: DC
  Administered 2015-11-09 – 2015-11-12 (×4): 30 mg via ORAL
  Filled 2015-11-08 (×4): qty 1

## 2015-11-08 MED ORDER — PROSIGHT PO TABS
1.0000 | ORAL_TABLET | Freq: Two times a day (BID) | ORAL | Status: DC
  Administered 2015-11-08 – 2015-11-12 (×8): 1 via ORAL
  Filled 2015-11-08 (×8): qty 1

## 2015-11-08 MED ORDER — VITAMIN D3 125 MCG (5000 UT) PO TABS: 1.0000 | ORAL_TABLET | Freq: Every morning | ORAL | Status: DC

## 2015-11-08 MED ORDER — MELATONIN 3 MG PO TABS
1.0000 | ORAL_TABLET | Freq: Every day | ORAL | Status: DC
  Administered 2015-11-08 – 2015-11-11 (×4): 3 mg via ORAL
  Filled 2015-11-08 (×4): qty 1

## 2015-11-08 MED ORDER — PRESERVISION AREDS 2 PO CAPS: 1.0000 | ORAL_CAPSULE | Freq: Two times a day (BID) | ORAL | Status: DC

## 2015-11-08 MED ORDER — ZOLPIDEM TARTRATE 5 MG PO TABS
5.0000 mg | ORAL_TABLET | Freq: Every evening | ORAL | Status: DC | PRN
  Filled 2015-11-08: qty 1

## 2015-11-08 NOTE — ED Provider Notes (Signed)
[facility] DEPT Provider Note   CSN: [id number] Arrival date & time: 11/08/15  1408     History   Chief Complaint Chief Complaint  Patient presents with  . Irregular Heart Beat    HPI Genova Marchbank is a 75 y.o. female.  HPI Pt was called by her PCPs office per pt after her heart monitor she is wearing called them for an abnormal heart rhythm. Pt states before they called her today she was having weakness and feeling faint like she might pass out. Pt denies any chest pain or shortness of breath  Past Medical History:  Diagnosis Date  . Atrial fibrillation ([place])   . CAD (coronary artery disease)   . COPD (chronic obstructive pulmonary disease) ([place])   . Diabetes mellitus without complication ([place])   . GERD (gastroesophageal reflux disease)   . Hyperlipidemia   . Hypocalcemia   . Hypokalemia   . Macular degeneration   . Thyroid disease   . Vitamin B12 deficiency   . Vitamin D deficiency     Patient Active Problem List   Diagnosis Date Noted  . Hyperlipidemia 09/18/2015  . Symptomatic bradycardia 09/18/2015  . Coronary artery disease 09/10/2015  . B12 deficiency 09/10/2015  . History of melanoma 09/09/2015  . COPD (chronic obstructive pulmonary disease) ([place]) 09/09/2015  . Hypothyroidism (acquired) 09/09/2015  . Long term (current) use of anticoagulants [Z79.01] 08/26/2015  . Obesity, Class II, BMI 35-39.9 08/20/2015  . GERD with stricture 08/20/2015  . Macular degeneration 08/20/2015  . Diastolic CHF ([place]) [id number]  . History of parathyroidectomy 08/20/2015  . History of radioactive iodine thyroid ablation 08/20/2015  . CKD (chronic kidney disease) stage 3, GFR 30-59 ml/min 08/20/2015  . PAF (paroxysmal atrial fibrillation) ([place]) 08/19/2015  . Diabetes mellitus type 2, controlled, without complications ([place]) 0000000  . Gout 08/19/2015    Past Surgical History:  Procedure Laterality Date  . CHOLECYSTECTOMY    . COLONOSCOPY  2015   1 polyp  removed- repeat 3 years  . CORONARY ANGIOPLASTY WITH STENT PLACEMENT    . parathyroid surgery     x 2  . THYROID SURGERY    . TOTAL SHOULDER ARTHROPLASTY Right    x 2  . VAGINAL HYSTERECTOMY      OB History    No data available       Home Medications    Prior to Admission medications   Medication Sig Start Date End Date Taking? Authorizing Provider  allopurinol (ZYLOPRIM) 100 MG tablet Take 1 tablet (100 mg total) by mouth daily. 09/16/15  Yes Adline Potter, MD  ARTIFICIAL TEAR OP Place 1 drop into both eyes as needed (for dry eyes).   Yes Historical Provider, MD  Cholecalciferol (VITAMIN D3) 5000 units TABS Take 1 tablet by mouth every morning.   Yes Historical Provider, MD  furosemide (LASIX) 20 MG tablet Take 40mg  by mouth every other day alternating with 60mg  by mouth every other day. 09/16/15  Yes Adline Potter, MD  isosorbide mononitrate (IMDUR) 30 MG 24 hr tablet Take 1 tablet (30 mg total) by mouth daily. 30 mg tablet 10/06/15  Yes Glean Hess, MD  levothyroxine (SYNTHROID, LEVOTHROID) 125 MCG tablet Take 1 tablet (125 mcg total) by mouth daily. D/c order for 177mcg due to lab results- pt needs to take this dosing (164mcg) 09/22/15  Yes Juline Patch, MD  lisinopril (PRINIVIL,ZESTRIL) 2.5 MG tablet Take 2.5 mg by mouth daily.   Yes Historical Provider, MD  Melatonin 5 MG TABS  Take 1 tablet by mouth at bedtime.   Yes Historical Provider, MD  metFORMIN (GLUCOPHAGE) 500 MG tablet Take 1 tablet (500 mg total) by mouth 2 (two) times daily with a meal. 09/16/15  Yes Adline Potter, MD  Multiple Vitamins-Minerals (PRESERVISION AREDS 2 PO) Take 1 capsule by mouth 2 (two) times daily.    Yes Historical Provider, MD  pantoprazole (PROTONIX) 40 MG tablet Take 1 tablet (40 mg total) by mouth daily. 10/16/15  Yes Juline Patch, MD  potassium chloride (KLOR-CON) 8 MEQ tablet Take 1 tablet (8 mEq total) by mouth 2 (two) times daily. 09/16/15  Yes Adline Potter, MD  rosuvastatin (CRESTOR) 40 MG  tablet Take 1 tablet (40 mg total) by mouth daily. 09/18/15  Yes Minna Merritts, MD  sitaGLIPtin (JANUVIA) 100 MG tablet Take 1 tablet (100 mg total) by mouth daily. 09/16/15  Yes Adline Potter, MD  vitamin B-12 (CYANOCOBALAMIN) 500 MCG tablet Take 500 mcg by mouth daily.   Yes Historical Provider, MD  warfarin (COUMADIN) 7.5 MG tablet Take 1 tablet (7.5 mg total) by mouth daily. 08/28/15  Yes Adline Potter, MD  nitroGLYCERIN (NITROSTAT) 0.4 MG SL tablet Place 0.4 mg under the tongue every 5 (five) minutes as needed for chest pain.    Historical Provider, MD    Family History Family History  Problem Relation Age of Onset  . Thyroid disease Mother 60    3 days after surgery  . Heart defect Father 65    ?ascending aortic aneurysm    Social History Social History  Substance Use Topics  . Smoking status: Never Smoker  . Smokeless tobacco: Not on file  . Alcohol use 0.0 oz/week     Allergies   Demerol [meperidine]; Sulfa antibiotics; and Tetracyclines & related   Review of Systems Review of Systems  Cardiovascular: Negative for chest pain.  Neurological: Positive for syncope (pre-syncope).  All other systems reviewed and are negative.    Physical Exam Updated Vital Signs BP (!) 126/59 (BP Location: Left Arm)   Pulse 70   Temp 97.5 F (36.4 C) (Oral)   Resp 18   Ht 5\' 3"  (1.6 m)   Wt 211 lb (95.7 kg)   SpO2 95%   BMI 37.38 kg/m   Physical Exam Physical Exam  Nursing note and vitals reviewed. Constitutional: She is oriented to person, place, and time. She appears well-developed and well-nourished. No distress.  HENT:  Head: Normocephalic and atraumatic.  Eyes: Pupils are equal, round, and reactive to light.  Neck: Normal range of motion.  no significant carotid bruits. Cardiovascular: Normal rate and intact distal pulses.  Patient has an irregular rhythm.  Occasional episodes of bradycardia.   Pulmonary/Chest: No respiratory distress.  Abdominal: Normal appearance.  She exhibits no distension.  Musculoskeletal: Normal range of motion.  Neurological: She is alert and oriented to person, place, and time. No cranial nerve deficit.  Skin: Skin is warm and dry. No rash noted.  Psychiatric: She has a normal mood and affect. Her behavior is normal.    ED Treatments / Results  Labs (all labs ordered are listed, but only abnormal results are displayed) Labs Reviewed  BASIC METABOLIC PANEL - Abnormal; Notable for the following:       Result Value   Glucose, Bld 190 (*)    Creatinine, Ser 1.18 (*)    GFR calc non Af Amer 44 (*)    GFR calc Af Amer 51 (*)    All other components within  normal limits  TSH - Abnormal; Notable for the following:    TSH 0.061 (*)    All other components within normal limits  T4, FREE - Abnormal; Notable for the following:    Free T4 2.08 (*)    All other components within normal limits  PROTIME-INR - Abnormal; Notable for the following:    Prothrombin Time 20.4 (*)    All other components within normal limits  GLUCOSE, CAPILLARY - Abnormal; Notable for the following:    Glucose-Capillary 143 (*)    All other components within normal limits  GLUCOSE, CAPILLARY - Abnormal; Notable for the following:    Glucose-Capillary 138 (*)    All other components within normal limits  CBC  T3, FREE  HEPARIN LEVEL (UNFRACTIONATED)  CBC  HEPARIN LEVEL (UNFRACTIONATED)  I-STAT TROPOININ, ED    EKG  EKG Interpretation  Date/Time:  Sunday November 08 2015 14:12:23 EDT Ventricular Rate:  63 PR Interval:    QRS Duration: 84 QT Interval:  422 QTC Calculation: 431 R Axis:   42 Text Interpretation:  Atrial fibrillation Low voltage QRS Nonspecific ST abnormality Abnormal ECG No previous tracing Confirmed by Shirah Roseman  MD, Terre Hanneman ([id number]) on 11/08/2015 2:55:12 PM       Radiology Dg Chest Portable 1 View  Result Date: 11/08/2015 CLINICAL DATA:  Abnormal heart rhythm.  Weakness. EXAM: PORTABLE CHEST 1 VIEW COMPARISON:  None. FINDINGS:  Cardiac monitor projects over the left chest. Heart is borderline enlarged. No confluent airspace opacities or effusions. No acute bony abnormality. Prior right shoulder replacement. IMPRESSION: Borderline heart size.  No active disease. Electronically Signed   By: Rolm Baptise M.D.   On: 11/08/2015 15:19    Procedures Procedures (including critical care time)  Medications Ordered in ED Medications  acetaminophen (TYLENOL) tablet 650 mg (not administered)  ondansetron (ZOFRAN) injection 4 mg (not administered)  zolpidem (AMBIEN) tablet 5 mg (5 mg Oral Not Given 11/08/15 2231)  ALPRAZolam (XANAX) tablet 0.25 mg (not administered)  allopurinol (ZYLOPRIM) tablet 100 mg (100 mg Oral Not Given 11/08/15 1700)  furosemide (LASIX) tablet 40 mg (40 mg Oral Not Given 11/08/15 1700)  isosorbide mononitrate (IMDUR) 24 hr tablet 30 mg (not administered)  levothyroxine (SYNTHROID, LEVOTHROID) tablet 125 mcg (125 mcg Oral Given 11/09/15 0636)  lisinopril (PRINIVIL,ZESTRIL) tablet 2.5 mg (2.5 mg Oral Not Given 11/08/15 1900)  Melatonin TABS 3 mg (3 mg Oral Given 11/08/15 2231)  pantoprazole (PROTONIX) EC tablet 40 mg (40 mg Oral Not Given 11/08/15 1700)  potassium chloride (KLOR-CON) CR tablet 8 mEq (8 mEq Oral Given 11/08/15 2231)  rosuvastatin (CRESTOR) tablet 40 mg (40 mg Oral Given 11/08/15 1825)  linagliptin (TRADJENTA) tablet 5 mg (not administered)  vitamin B-12 (CYANOCOBALAMIN) tablet 500 mcg (not administered)  insulin aspart (novoLOG) injection 0-15 Units (2 Units Subcutaneous Given 11/09/15 0636)  cholecalciferol (VITAMIN D) tablet 5,000 Units (not administered)  multivitamin (PROSIGHT) tablet 1 tablet (1 tablet Oral Given 11/08/15 2231)  heparin ADULT infusion 100 units/mL (25000 units/242mL sodium chloride 0.45%) (1,150 Units/hr Intravenous New Bag/Given 11/08/15 2250)     Initial Impression / Assessment and Plan / ED Course  I have reviewed the triage vital signs and the nursing notes.  Pertinent  labs & imaging results that were available during my care of the patient were reviewed by me and considered in my medical decision making (see chart for details).  Clinical Course  Cardiology was consult them.  Patient was admitted to their service.    Final Clinical  Impressions(s) / ED Diagnoses   Final diagnoses:  Bradyarrhythmia    New Prescriptions Current Discharge Medication List       Leonard Schwartz, MD 11/09/15 [phone]

## 2015-11-08 NOTE — H&P (Addendum)
History and Physical   Patient ID: Dawn Garcia MRN: [id number], DOB/AGE: 75-Jul-1942 75 y.o. Date of Encounter: 11/08/2015  Primary Physician: Adline Potter, MD Primary Cardiologist: Dr Rockey Situ  Chief Complaint:  Presyncope  HPI: Dawn Garcia is a 75 y.o. female with a history of CAD w/ stent which was patent by cath 2014 (location unknown), atrial flutter w/ ablation and recent nl echo/MV.  Pt seen by Dr Rockey Situ 07/07 and was having some palpitations. Event monitor ordered, she decided to have it put on in August. 5 second pauses seen, pt symptomatic with these and asked to come to the ER.   Ms Oland Describes 3 episodes of presyncope. The feelings will come on her. She will feel flushed and hot and like she is going to fall down. The symptoms resolved in just a few seconds. She has not completely loss consciousness. They have not been associated with chest pain. She has not had palpitations with these although she has palpitations at times. She is aware that her pulse is irregular at times.  PPM has been considered in the past, because all rate-control medications made her too slow, but she has refused.   Past Medical History:  Diagnosis Date  . Atrial fibrillation ([place])   . CAD (coronary artery disease)   . COPD (chronic obstructive pulmonary disease) ([place])   . Diabetes mellitus without complication ([place])   . GERD (gastroesophageal reflux disease)   . Hyperlipidemia   . Hypocalcemia   . Hypokalemia   . Macular degeneration   . Thyroid disease   . Vitamin B12 deficiency   . Vitamin D deficiency     Surgical History:  Past Surgical History:  Procedure Laterality Date  . CHOLECYSTECTOMY    . COLONOSCOPY  2015   1 polyp removed- repeat 3 years  . CORONARY ANGIOPLASTY WITH STENT PLACEMENT    . parathyroid surgery     x 2  . THYROID SURGERY    . TOTAL SHOULDER ARTHROPLASTY Right    x 2  . VAGINAL HYSTERECTOMY       I have reviewed the patient's current  medications. Prior to Admission medications   Medication Sig Start Date End Date Taking? Authorizing Provider  allopurinol (ZYLOPRIM) 100 MG tablet Take 1 tablet (100 mg total) by mouth daily. 09/16/15   Adline Potter, MD  Cholecalciferol (VITAMIN D3) 5000 units TABS Take 1 tablet by mouth every morning.    Historical Provider, MD  furosemide (LASIX) 20 MG tablet Take 40mg  by mouth every other day alternating with 60mg  by mouth every other day. 09/16/15   Adline Potter, MD  isosorbide mononitrate (IMDUR) 30 MG 24 hr tablet Take 1 tablet (30 mg total) by mouth daily. 30 mg tablet 10/06/15   Glean Hess, MD  levothyroxine (SYNTHROID, LEVOTHROID) 125 MCG tablet Take 1 tablet (125 mcg total) by mouth daily. D/c order for 180mcg due to lab results- pt needs to take this dosing (160mcg) 09/22/15   Juline Patch, MD  lisinopril (PRINIVIL,ZESTRIL) 2.5 MG tablet Take 2.5 mg by mouth daily.    Historical Provider, MD  Melatonin 5 MG TABS Take 1 tablet by mouth at bedtime.    Historical Provider, MD  metFORMIN (GLUCOPHAGE) 500 MG tablet Take 1 tablet (500 mg total) by mouth 2 (two) times daily with a meal. 09/16/15   Adline Potter, MD  Multiple Vitamins-Minerals (PRESERVISION AREDS 2 PO) Take by mouth at bedtime.    Historical Provider, MD  nitroGLYCERIN (NITROSTAT)  0.4 MG SL tablet Place 0.4 mg under the tongue every 5 (five) minutes as needed for chest pain.    Historical Provider, MD  pantoprazole (PROTONIX) 40 MG tablet Take 1 tablet (40 mg total) by mouth daily. 10/16/15   Juline Patch, MD  potassium chloride (KLOR-CON) 8 MEQ tablet Take 1 tablet (8 mEq total) by mouth 2 (two) times daily. 09/16/15   Adline Potter, MD  rosuvastatin (CRESTOR) 40 MG tablet Take 1 tablet (40 mg total) by mouth daily. 09/18/15   Minna Merritts, MD  sitaGLIPtin (JANUVIA) 100 MG tablet Take 1 tablet (100 mg total) by mouth daily. 09/16/15   Adline Potter, MD  tobramycin (TOBREX) 0.3 % ophthalmic solution  09/07/15   Historical  Provider, MD  vitamin B-12 (CYANOCOBALAMIN) 500 MCG tablet Take 500 mcg by mouth daily.    Historical Provider, MD  warfarin (COUMADIN) 7.5 MG tablet Take 1 tablet (7.5 mg total) by mouth daily. 08/28/15   Adline Potter, MD   Scheduled Meds: Continuous Infusions: PRN Meds:.  Allergies:  Allergies  Allergen Reactions  . Demerol [Meperidine] Anaphylaxis  . Sulfa Antibiotics Anaphylaxis  . Tetracyclines & Related Other (See Comments)    Made nose, lips,  And tongue itchy    Social History   Social History  . Marital status: Married    Spouse name: N/A  . Number of children: N/A  . Years of education: N/A   Occupational History  . Retired    Social History Main Topics  . Smoking status: Never Smoker  . Smokeless tobacco: Not on file  . Alcohol use 0.0 oz/week  . Drug use: No  . Sexual activity: Not on file   Other Topics Concern  . Not on file   Social History Narrative   Lives in [place] w/ husband.    Family History  Problem Relation Age of Onset  . Thyroid disease Mother 25    3 days after surgery  . Heart defect Father 61    ?ascending aortic aneurysm   Family Status  Relation Status  . Mother Deceased  . Father Deceased    Review of Systems:   Full 14-point review of systems otherwise negative except as noted above.  Physical Exam: Blood pressure 142/73, pulse 69, temperature 98 F (36.7 C), temperature source Oral, resp. rate 14, height 5\' 5"  (1.651 m), weight 212 lb (96.2 kg), SpO2 95 %. General: Well developed, well nourished,female in no acute distress. Head: Normocephalic, atraumatic, sclera non-icteric, no xanthomas, nares are without discharge. Dentition: good Neck: No carotid bruits. JVD not elevated. No thyromegally Lungs: Good expansion bilaterally. without wheezes or rhonchi.  Heart: Regular rate and rhythm with S1 S2.  No S3 or S4.  No murmur, no rubs, or gallops appreciated. Abdomen: Soft, non-tender, non-distended with normoactive bowel  sounds. No hepatomegaly. No rebound/guarding. No obvious abdominal masses. Msk:  Strength and tone appear normal for age. No joint deformities or effusions, no spine or costo-vertebral angle tenderness. Extremities: No clubbing or cyanosis. No edema.  Distal pedal pulses are 2+ in 4 extrem Neuro: Alert and oriented X 3. Moves all extremities spontaneously. No focal deficits noted. Psych:  Responds to questions appropriately with a normal affect. Skin: No rashes or lesions noted  Labs:   Lab Results  Component Value Date   WBC 7.6 11/08/2015   HGB 14.0 11/08/2015   HCT 42.4 11/08/2015   MCV 89.1 11/08/2015   PLT 191 11/08/2015   Lab Results  Component Value  Date   CHOL 200 (H) 09/09/2015   HDL 44 09/09/2015   LDLCALC Comment 09/09/2015   TRIG 418 (H) 09/09/2015    Radiology/Studies: Dg Chest Portable 1 View  Result Date: 11/08/2015 CLINICAL DATA:  Abnormal heart rhythm.  Weakness. EXAM: PORTABLE CHEST 1 VIEW COMPARISON:  None. FINDINGS: Cardiac monitor projects over the left chest. Heart is borderline enlarged. No confluent airspace opacities or effusions. No acute bony abnormality. Prior right shoulder replacement. IMPRESSION: Borderline heart size.  No active disease. Electronically Signed   By: Rolm Baptise M.D.   On: 11/08/2015 15:19    ECG: 08/27 Atrial fib, slow VR, HR 36 Junctional escape w/ occ sinus beats.  TTE: 09/02/2015  - Left ventricle: The cavity size was normal. Posterior wall   thickness was increased.. Systolic function was normal. The   estimated ejection fraction was in the range of 60% to 65%. Wall   motion was normal; there were no regional wall motion   abnormalities. Doppler parameters are consistent with abnormal   left ventricular relaxation (grade 1 diastolic dysfunction). - Aortic valve: There was trivial regurgitation. - Left atrium: The atrium was mildly dilated.   ASSESSMENT AND PLAN:  Principal Problem:   Symptomatic bradycardia -  admit, monitor on telemetry - no rate-lowering rx - ck TFTs - EP to see in am, NPO after mn for possible PPM  Otherwise, continue home rx, SSI Active Problems:   PAF (paroxysmal atrial fibrillation) ([place])   Diabetes mellitus type 2, controlled, without complications ([place])   Long term (current) use of anticoagulants [Z79.01]   Hypothyroidism (acquired)   Signed, Lenoard Aden 11/08/2015 4:42 PM Beeper [id number]   The patient was seen, examined and discussed with Rosaria Ferries, PA-C and I agree with the above.    75 year old female with prior medical h/o CAD, s/p stent ([place] artery) patent by cath in 2014, atrial flutter w/ ablation and atrial fibrillation ablation in 2013, all done in [facility], she just moved here, established care with Dr Rockey Situ, echo in 08/2015 showed normal LVEF, mild LA dilatation.  Pt seen by Dr Rockey Situ 07/07 and was complaining of palpitations and dizziness, he started 30 day monitor. Today she called with worsening dizziness, now almost daily and feeling of presyncope. Obtained strips showed PAF with frequent pauses, the longest while in a-fib lasting 5 seconds.  While in the ER she has short episodes of a-fib with slow HR down to 32 BPM, she also has episodes of sinus arrest with escape junctional rhythm and atrial bigeminy.   Of note, she is post thyroidectomy on Synthroid, her TSH in June was low, but fT# and fT$ were normal, her Synthroid dose was adjusted, however her symptoms started 4 months prior to this so it shouldn't have affected her rate. We will recheck now. Otherwise normal electrolytes. She is on no AVN blocking agents.  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  We will hold warfarin, the last INR 1.9 on 8/23, we will recheck and if < 2, start heparin.   Ena Dawley 11/08/2015

## 2015-11-08 NOTE — Progress Notes (Signed)
ANTICOAGULATION CONSULT NOTE - Initial Consult  Pharmacy Consult for Heparin Indication: atrial fibrillation (on Coumadin PTA)  Allergies  Allergen Reactions  . Demerol [Meperidine] Anaphylaxis  . Sulfa Antibiotics Anaphylaxis  . Tetracyclines & Related Other (See Comments)    Made nose, lips,  And tongue itchy    Patient Measurements: Height: 5\' 3"  (160 cm) Weight: 209 lb 6.4 oz (95 kg) IBW/kg (Calculated) : 52.4 Heparin Dosing Weight: 76  Vital Signs: Temp: 98 F (36.7 C) (08/27 1831) Temp Source: Oral (08/27 1831) BP: 146/66 (08/27 1831) Pulse Rate: 62 (08/27 1831)  Labs:  Recent Labs  11/08/15 1420 11/08/15 1720  HGB 14.0  --   HCT 42.4  --   PLT 191  --   LABPROT  --  20.4*  INR  --  1.73  CREATININE 1.18*  --     Estimated Creatinine Clearance: 45.8 mL/min (by C-G formula based on SCr of 1.18 mg/dL).   Medical History: Past Medical History:  Diagnosis Date  . Atrial fibrillation ([place])   . CAD (coronary artery disease)   . COPD (chronic obstructive pulmonary disease) ([place])   . Diabetes mellitus without complication ([place])   . GERD (gastroesophageal reflux disease)   . Hyperlipidemia   . Hypocalcemia   . Hypokalemia   . Macular degeneration   . Thyroid disease   . Vitamin B12 deficiency   . Vitamin D deficiency     Assessment: 75 year old female on Coumadin PTA for Afib Coumadin currently on hold for possible pacemaker placement INR on admission = 1.73  Goal of Therapy:  Heparin level 0.3-0.7 units/ml Monitor platelets by anticoagulation protocol: Yes   Plan:  No heparin bolus secondary to INR of 1.73 Heparin drip at 1150 units / hr Daily heparin level, CBC  Thank you Anette Guarneri, PharmD [phone]  Tad Moore 11/08/2015,7:00 PM

## 2015-11-08 NOTE — Telephone Encounter (Signed)
Pt in atrial fib and having symptomatic 5 second pauses.  Strips faxed over and reviewed with Dr Meda Coffee  Pt on no rate-lowering rx.   Contacted pt and advised her to come to [facility], EMS preferred. She prefers her husband to drive her, but will come in.  Rosaria Ferries, Hershal Coria 11/08/2015 1:15 PM Beeper [phone]

## 2015-11-08 NOTE — Progress Notes (Signed)
Patient arrived in the unit accompanied by NT via stretcher. Orientation to the unit given. Patient verbalizes understanding. 

## 2015-11-08 NOTE — ED Notes (Signed)
PAGED CARDS/[place]

## 2015-11-08 NOTE — ED Notes (Signed)
Patient had episode of feeling faint; pause noted on bedside monitor. Printed strip and notified Dr. Audie Pinto.

## 2015-11-08 NOTE — ED Triage Notes (Signed)
Pt was called by her PCPs office per pt after her heart monitor she is wearing called them for an abnormal heart rhythm. Pt states before they called her today she was having weakness and feeling faint like she might pass out. Pt denies any chest pain or shortness of breath.

## 2015-11-09 DIAGNOSIS — E119 Type 2 diabetes mellitus without complications: Secondary | ICD-10-CM | POA: Diagnosis present

## 2015-11-09 DIAGNOSIS — Z888 Allergy status to other drugs, medicaments and biological substances status: Secondary | ICD-10-CM | POA: Diagnosis not present

## 2015-11-09 DIAGNOSIS — R008 Other abnormalities of heart beat: Secondary | ICD-10-CM | POA: Diagnosis not present

## 2015-11-09 DIAGNOSIS — Z882 Allergy status to sulfonamides status: Secondary | ICD-10-CM | POA: Diagnosis not present

## 2015-11-09 DIAGNOSIS — Z7984 Long term (current) use of oral hypoglycemic drugs: Secondary | ICD-10-CM | POA: Diagnosis not present

## 2015-11-09 DIAGNOSIS — Z955 Presence of coronary angioplasty implant and graft: Secondary | ICD-10-CM | POA: Diagnosis not present

## 2015-11-09 DIAGNOSIS — E785 Hyperlipidemia, unspecified: Secondary | ICD-10-CM | POA: Diagnosis present

## 2015-11-09 DIAGNOSIS — R001 Bradycardia, unspecified: Secondary | ICD-10-CM | POA: Diagnosis not present

## 2015-11-09 DIAGNOSIS — I455 Other specified heart block: Secondary | ICD-10-CM | POA: Diagnosis present

## 2015-11-09 DIAGNOSIS — Z87892 Personal history of anaphylaxis: Secondary | ICD-10-CM | POA: Diagnosis not present

## 2015-11-09 DIAGNOSIS — I499 Cardiac arrhythmia, unspecified: Secondary | ICD-10-CM | POA: Diagnosis not present

## 2015-11-09 DIAGNOSIS — Z7901 Long term (current) use of anticoagulants: Secondary | ICD-10-CM | POA: Diagnosis not present

## 2015-11-09 DIAGNOSIS — I495 Sick sinus syndrome: Secondary | ICD-10-CM | POA: Diagnosis not present

## 2015-11-09 DIAGNOSIS — Z45018 Encounter for adjustment and management of other part of cardiac pacemaker: Secondary | ICD-10-CM | POA: Diagnosis not present

## 2015-11-09 DIAGNOSIS — Z881 Allergy status to other antibiotic agents status: Secondary | ICD-10-CM | POA: Diagnosis not present

## 2015-11-09 DIAGNOSIS — Z886 Allergy status to analgesic agent status: Secondary | ICD-10-CM | POA: Diagnosis not present

## 2015-11-09 DIAGNOSIS — J449 Chronic obstructive pulmonary disease, unspecified: Secondary | ICD-10-CM | POA: Diagnosis present

## 2015-11-09 DIAGNOSIS — T82120A Displacement of cardiac electrode, initial encounter: Secondary | ICD-10-CM | POA: Diagnosis not present

## 2015-11-09 DIAGNOSIS — E890 Postprocedural hypothyroidism: Secondary | ICD-10-CM | POA: Diagnosis present

## 2015-11-09 DIAGNOSIS — I442 Atrioventricular block, complete: Secondary | ICD-10-CM | POA: Diagnosis not present

## 2015-11-09 DIAGNOSIS — Y710 Diagnostic and monitoring cardiovascular devices associated with adverse incidents: Secondary | ICD-10-CM | POA: Diagnosis not present

## 2015-11-09 DIAGNOSIS — Y92230 Patient room in hospital as the place of occurrence of the external cause: Secondary | ICD-10-CM | POA: Diagnosis not present

## 2015-11-09 DIAGNOSIS — I480 Paroxysmal atrial fibrillation: Secondary | ICD-10-CM | POA: Diagnosis present

## 2015-11-09 DIAGNOSIS — Z6836 Body mass index (BMI) 36.0-36.9, adult: Secondary | ICD-10-CM | POA: Diagnosis not present

## 2015-11-09 DIAGNOSIS — E669 Obesity, unspecified: Secondary | ICD-10-CM | POA: Diagnosis present

## 2015-11-09 DIAGNOSIS — K219 Gastro-esophageal reflux disease without esophagitis: Secondary | ICD-10-CM | POA: Diagnosis present

## 2015-11-09 DIAGNOSIS — Z950 Presence of cardiac pacemaker: Secondary | ICD-10-CM | POA: Diagnosis not present

## 2015-11-09 DIAGNOSIS — I2510 Atherosclerotic heart disease of native coronary artery without angina pectoris: Secondary | ICD-10-CM | POA: Diagnosis present

## 2015-11-09 DIAGNOSIS — I10 Essential (primary) hypertension: Secondary | ICD-10-CM | POA: Diagnosis present

## 2015-11-09 LAB — CBC
HCT: 38.4 % (ref 36.0–46.0)
Hemoglobin: 12.3 g/dL (ref 12.0–15.0)
MCH: 28.8 pg (ref 26.0–34.0)
MCHC: 32 g/dL (ref 30.0–36.0)
MCV: 89.9 fL (ref 78.0–100.0)
PLATELETS: 177 10*3/uL (ref 150–400)
RBC: 4.27 MIL/uL (ref 3.87–5.11)
RDW: 14.4 % (ref 11.5–15.5)
WBC: 7.7 10*3/uL (ref 4.0–10.5)

## 2015-11-09 LAB — GLUCOSE, CAPILLARY
Glucose-Capillary: 138 mg/dL — ABNORMAL HIGH (ref 65–99)
Glucose-Capillary: 185 mg/dL — ABNORMAL HIGH (ref 65–99)
Glucose-Capillary: 170 mg/dL — ABNORMAL HIGH (ref 65–99)
Glucose-Capillary: 194 mg/dL — ABNORMAL HIGH (ref 65–99)

## 2015-11-09 LAB — T3, FREE: T3, Free: 3 pg/mL (ref 2.0–4.4)

## 2015-11-09 LAB — HEPARIN LEVEL (UNFRACTIONATED)
Heparin Unfractionated: 0.47 IU/mL (ref 0.30–0.70)
Heparin Unfractionated: 0.69 IU/mL (ref 0.30–0.70)

## 2015-11-09 MED ORDER — CEFAZOLIN SODIUM-DEXTROSE 2-4 GM/100ML-% IV SOLN: 2.0000 g | INTRAVENOUS | Status: DC

## 2015-11-09 MED ORDER — SODIUM CHLORIDE 0.9% FLUSH
3.0000 mL | Freq: Two times a day (BID) | INTRAVENOUS | Status: DC
  Administered 2015-11-09: 3 mL via INTRAVENOUS

## 2015-11-09 MED ORDER — CHLORHEXIDINE GLUCONATE 4 % EX LIQD
60.0000 mL | Freq: Once | CUTANEOUS | Status: AC
  Administered 2015-11-10: 4 via TOPICAL
  Filled 2015-11-09: qty 60

## 2015-11-09 MED ORDER — SODIUM CHLORIDE 0.9 % IR SOLN
80.0000 mg | Status: AC
  Administered 2015-11-10: 80 mg
  Filled 2015-11-09: qty 2

## 2015-11-09 MED ORDER — SODIUM CHLORIDE 0.9% FLUSH: 3.0000 mL | INTRAVENOUS | Status: DC | PRN

## 2015-11-09 MED ORDER — SODIUM CHLORIDE 0.9 % IV SOLN
250.0000 mL | INTRAVENOUS | Status: DC
Start: 2015-11-09 — End: 2015-11-10

## 2015-11-09 MED ORDER — SODIUM CHLORIDE 0.9 % IV SOLN
INTRAVENOUS | Status: DC
  Administered 2015-11-10: 06:00:00 via INTRAVENOUS

## 2015-11-09 MED ORDER — CHLORHEXIDINE GLUCONATE 4 % EX LIQD
60.0000 mL | Freq: Once | CUTANEOUS | Status: AC
  Administered 2015-11-09: 4 via TOPICAL
  Filled 2015-11-09: qty 60

## 2015-11-09 NOTE — Progress Notes (Signed)
ANTICOAGULATION CONSULT NOTE - Follow-up Consult  Pharmacy Consult for Heparin Indication: atrial fibrillation (on Coumadin PTA)  Allergies  Allergen Reactions  . Demerol [Meperidine] Anaphylaxis  . Sulfa Antibiotics Anaphylaxis  . Tetracyclines & Related Other (See Comments)    Made nose, lips,  And tongue itchy    Patient Measurements: Height: 5\' 3"  (160 cm) Weight: 211 lb (95.7 kg) IBW/kg (Calculated) : 52.4 Heparin Dosing Weight: 76  Vital Signs: Temp: 98.1 F (36.7 C) (08/28 1100) Temp Source: Oral (08/28 1100) BP: 143/60 (08/28 1100) Pulse Rate: 53 (08/28 1100)  Labs:  Recent Labs  11/08/15 1420 11/08/15 1720 11/09/15 0518 11/09/15 1150  HGB 14.0  --  12.3  --   HCT 42.4  --  38.4  --   PLT 191  --  177  --   LABPROT  --  20.4*  --   --   INR  --  1.73  --   --   HEPARINUNFRC  --   --  0.47 0.69  CREATININE 1.18*  --   --   --     Estimated Creatinine Clearance: 46 mL/min (by C-G formula based on SCr of 1.18 mg/dL).   Assessment: 75 year old female on Coumadin PTA for Afib. Coumadin currently on hold for possible pacemaker placement. INR on admission = 1.73. Heparin level therapeutic on 1150 units/hr. Hgb, plt down a bit. No bleeding noted.  Confirmatory HL remains therapeutic at 0.69 on heparin 1150 units/hr. No issues with infusion or bleeding noted.  Goal of Therapy:  Heparin level 0.3-0.7 units/ml Monitor platelets by anticoagulation protocol: Yes   Plan:  Continue heparin 1150 units/hr Daily HL Monitor s/sx of bleeding  Andrey Cota. Diona Foley, PharmD, BCPS Clinical Pharmacist Pager [phone] 11/09/2015,2:30 PM

## 2015-11-09 NOTE — Progress Notes (Signed)
Pt reports that she feels heart palpitation and dizzy at times. Her heart rate also drops to the 38-39 range at times. Pt is asking when she will be able to talk to Dr. Lovena Le. PA is paged with info.

## 2015-11-09 NOTE — Care Management Obs Status (Signed)
MEDICARE OBSERVATION STATUS NOTIFICATION   Patient Details  Name: Jomaira Demian MRN: [id number] Date of Birth: [birth year]   Medicare Observation Status Notification Given:  Yes    Royston Bake, RN 11/09/2015, 2:57 PM

## 2015-11-09 NOTE — Progress Notes (Signed)
ANTICOAGULATION CONSULT NOTE - Follow-up Consult  Pharmacy Consult for Heparin Indication: atrial fibrillation (on Coumadin PTA)  Allergies  Allergen Reactions  . Demerol [Meperidine] Anaphylaxis  . Sulfa Antibiotics Anaphylaxis  . Tetracyclines & Related Other (See Comments)    Made nose, lips,  And tongue itchy    Patient Measurements: Height: 5\' 3"  (160 cm) Weight: 211 lb (95.7 kg) IBW/kg (Calculated) : 52.4 Heparin Dosing Weight: 76  Vital Signs: Temp: 97.5 F (36.4 C) (08/28 0525) Temp Source: Oral (08/28 0525) BP: 126/59 (08/28 0525) Pulse Rate: 70 (08/28 0525)  Labs:  Recent Labs  11/08/15 1420 11/08/15 1720 11/09/15 0518  HGB 14.0  --  12.3  HCT 42.4  --  38.4  PLT 191  --  177  LABPROT  --  20.4*  --   INR  --  1.73  --   HEPARINUNFRC  --   --  0.47  CREATININE 1.18*  --   --     Estimated Creatinine Clearance: 46 mL/min (by C-G formula based on SCr of 1.18 mg/dL).   Assessment: 75 year old female on Coumadin PTA for Afib. Coumadin currently on hold for possible pacemaker placement. INR on admission = 1.73. Heparin level therapeutic on 1150 units/hr. Hgb, plt down a bit. No bleeding noted.  Goal of Therapy:  Heparin level 0.3-0.7 units/ml Monitor platelets by anticoagulation protocol: Yes   Plan:  Continue heparin drip at 1150 units / hr F/u 6hr confirmatory heparin level  Sherlon Handing, PharmD, BCPS Clinical pharmacist, pager [phone] 11/09/2015,5:50 AM

## 2015-11-09 NOTE — Consult Note (Signed)
ELECTROPHYSIOLOGY CONSULT NOTE    Patient ID: Dawn Garcia MRN: [id number], DOB/AGE: 75-16-1942 75 y.o.  Admit date: 11/08/2015 Date of Consult: 11/09/2015  Primary Physician: Adline Potter, MD Primary Cardiologist: Dr. Karl Bales  Reason for Consultation: pauses  HPI: Dawn Garcia is a 75 y.o. female with PMHx of CAD  (remote stent, patent by cath 2014), PAFib reportedly with an ablation in 2013, COPD, DM, HLD, hypothyroidism, reported history of bradycardia, last note by Dr.Golen states the patient informed him she is unable to take rate controlling medicines secondary to bradycardia.    The patient came to [facility] secondary to episodes of weakness and pre-syncope.  She reports about a year of these, split-second episodes that she feels suddenly weak/like she may faint and is over as soon as it started, in the last 6 months she is having these more frequently and with more sever symptoms, some lasting a couple seconds.  She was wearing a monitor bia Dr. Karl Bales with c/o feeling SOB to further evaluate if this was her COPD or PAFib making her SOB when she was called by the office and told the monitor showed a 5 second pause and she needed to go to the hospital.  She reports that in 3013 the decision to do AFib ablation was made because her sinus rates 50's and intolerant of BB making this even slower, and her AF rates 120's, since the ablation she reports her AFib rates controlled and her avg resting rates typically 50's.  But has been told historically that sooner or later she may need a pacer.  She has not fainted.  No CP, no overt sensation of palpitations.  Admit LABS: K+ 4.0 BUN/Creat 14/1.18 Trop 0.00 H/H 14/42 WBC 7.6 plts 191 INR 1.73 >> heparin gtt TSH 0.061  No rate/rhythm controlling meds out patient or inpatient  Past Medical History:  Diagnosis Date  . Atrial fibrillation ([place])   . CAD (coronary artery disease)   . COPD (chronic obstructive pulmonary disease) ([place])     . Diabetes mellitus without complication ([place])   . GERD (gastroesophageal reflux disease)   . Hyperlipidemia   . Hypocalcemia   . Hypokalemia   . Macular degeneration   . Thyroid disease   . Vitamin B12 deficiency   . Vitamin D deficiency      Surgical History:  Past Surgical History:  Procedure Laterality Date  . CHOLECYSTECTOMY    . COLONOSCOPY  2015   1 polyp removed- repeat 3 years  . CORONARY ANGIOPLASTY WITH STENT PLACEMENT    . parathyroid surgery     x 2  . THYROID SURGERY    . TOTAL SHOULDER ARTHROPLASTY Right    x 2  . VAGINAL HYSTERECTOMY       Prescriptions Prior to Admission  Medication Sig Dispense Refill Last Dose  . allopurinol (ZYLOPRIM) 100 MG tablet Take 1 tablet (100 mg total) by mouth daily. 90 tablet 1 11/08/2015 at Unknown time  . ARTIFICIAL TEAR OP Place 1 drop into both eyes as needed (for dry eyes).   Past Week at Unknown time  . Cholecalciferol (VITAMIN D3) 5000 units TABS Take 1 tablet by mouth every morning.   11/08/2015 at Unknown time  . furosemide (LASIX) 20 MG tablet Take 40mg  by mouth every other day alternating with 60mg  by mouth every other day. 225 tablet 1 11/08/2015 at Unknown time  . isosorbide mononitrate (IMDUR) 30 MG 24 hr tablet Take 1 tablet (30 mg total) by mouth daily. 30 mg  tablet 30 tablet 0 11/08/2015 at Unknown time  . levothyroxine (SYNTHROID, LEVOTHROID) 125 MCG tablet Take 1 tablet (125 mcg total) by mouth daily. D/c order for 171mcg due to lab results- pt needs to take this dosing (148mcg) 90 tablet 1 11/08/2015 at Unknown time  . lisinopril (PRINIVIL,ZESTRIL) 2.5 MG tablet Take 2.5 mg by mouth daily.   Past Week at Unknown time  . Melatonin 5 MG TABS Take 1 tablet by mouth at bedtime.   11/07/2015 at Unknown time  . metFORMIN (GLUCOPHAGE) 500 MG tablet Take 1 tablet (500 mg total) by mouth 2 (two) times daily with a meal. 180 tablet 1 11/08/2015 at Unknown time  . Multiple Vitamins-Minerals (PRESERVISION AREDS 2 PO) Take 1  capsule by mouth 2 (two) times daily.    11/08/2015 at Unknown time  . pantoprazole (PROTONIX) 40 MG tablet Take 1 tablet (40 mg total) by mouth daily. 90 tablet 1 11/08/2015 at Unknown time  . potassium chloride (KLOR-CON) 8 MEQ tablet Take 1 tablet (8 mEq total) by mouth 2 (two) times daily. 180 tablet 1 11/08/2015 at Unknown time  . rosuvastatin (CRESTOR) 40 MG tablet Take 1 tablet (40 mg total) by mouth daily. 30 tablet 11 11/07/2015 at Unknown time  . sitaGLIPtin (JANUVIA) 100 MG tablet Take 1 tablet (100 mg total) by mouth daily. 90 tablet 1 11/08/2015 at Unknown time  . vitamin B-12 (CYANOCOBALAMIN) 500 MCG tablet Take 500 mcg by mouth daily.   11/08/2015 at Unknown time  . warfarin (COUMADIN) 7.5 MG tablet Take 1 tablet (7.5 mg total) by mouth daily. 90 tablet 3 11/07/2015 at Unknown time  . nitroGLYCERIN (NITROSTAT) 0.4 MG SL tablet Place 0.4 mg under the tongue every 5 (five) minutes as needed for chest pain.   Unknown at Unknown    Inpatient Medications:  . allopurinol  100 mg Oral Daily  . cholecalciferol  5,000 Units Oral Daily  . furosemide  40 mg Oral Daily  . insulin aspart  0-15 Units Subcutaneous TID WC  . isosorbide mononitrate  30 mg Oral Daily  . levothyroxine  125 mcg Oral QAC breakfast  . linagliptin  5 mg Oral Daily  . lisinopril  2.5 mg Oral Daily  . Melatonin  1 tablet Oral QHS  . multivitamin  1 tablet Oral BID  . pantoprazole  40 mg Oral Daily  . potassium chloride  8 mEq Oral BID  . rosuvastatin  40 mg Oral Daily  . vitamin B-12  500 mcg Oral Daily    Allergies:  Allergies  Allergen Reactions  . Demerol [Meperidine] Anaphylaxis  . Sulfa Antibiotics Anaphylaxis  . Tetracyclines & Related Other (See Comments)    Made nose, lips,  And tongue itchy    Social History   Social History  . Marital status: Married    Spouse name: N/A  . Number of children: N/A  . Years of education: N/A   Occupational History  . Retired    Social History Main Topics  .  Smoking status: Never Smoker  . Smokeless tobacco: Not on file  . Alcohol use 0.0 oz/week  . Drug use: No  . Sexual activity: Not on file   Other Topics Concern  . Not on file   Social History Narrative   Lives in [place] w/ husband.     Family History  Problem Relation Age of Onset  . Thyroid disease Mother 60    3 days after surgery  . Heart defect Father 28    ?  ascending aortic aneurysm     Review of Systems: All other systems reviewed and are otherwise negative except as noted above.  Physical Exam: Vitals:   11/08/15 1645 11/08/15 1831 11/08/15 2017 11/09/15 0525  BP: 131/59 (!) 146/66 125/62 (!) 126/59  Pulse: 65 62 73 70  Resp: 19 18 18 18   Temp:  98 F (36.7 C) 97.9 F (36.6 C) 97.5 F (36.4 C)  TempSrc:  Oral Oral Oral  SpO2: 98% 98% 96% 95%  Weight:  209 lb 6.4 oz (95 kg)  211 lb (95.7 kg)  Height:  5\' 3"  (1.6 m)      GEN- The patient is well appearing, alert and oriented x 3 today.   HEENT: normocephalic, atraumatic; sclera clear, conjunctiva pink; hearing intact; oropharynx clear; neck supple, no JVP Lymph- no cervical lymphadenopathy Lungs- Clear to ausculation bilaterally, normal work of breathing.  No wheezes, rales, rhonchi Heart- Regular rate and rhythm, no significant murmurs, rubs or gallops, PMI not laterally displaced GI- soft, non-tender, non-distended Extremities- no clubbing, cyanosis, or edema MS- no significant deformity or atrophy Skin- warm and dry, no rash or lesion Psych- euthymic mood, full affect Neuro- no gross deficits observed  Labs:   Lab Results  Component Value Date   WBC 7.7 11/09/2015   HGB 12.3 11/09/2015   HCT 38.4 11/09/2015   MCV 89.9 11/09/2015   PLT 177 11/09/2015    Recent Labs Lab 11/08/15 1420  NA 140  K 4.0  CL 106  CO2 25  BUN 14  CREATININE 1.18*  CALCIUM 9.5  GLUCOSE 190*      Radiology/Studies:  Dg Chest Portable 1 View Result Date: 11/08/2015 CLINICAL DATA:  Abnormal heart rhythm.   Weakness. EXAM: PORTABLE CHEST 1 VIEW COMPARISON:  None. FINDINGS: Cardiac monitor projects over the left chest. Heart is borderline enlarged. No confluent airspace opacities or effusions. No acute bony abnormality. Prior right shoulder replacement. IMPRESSION: Borderline heart size.  No active disease. Electronically Signed   By: Rolm Baptise M.D.   On: 11/08/2015 15:19    EKG: AFib 63bpm SR with junctional beats in a bigemenal rhythm, 36bpm TELEMETRY: AFib with CVR on 2 occassions episodes of what appears junctional rhythm 39-40's for approx 20 seconds to return to AF, the second longer with intermittent sinus beats then into SR 60's and has remained in SR, with occassional 1.5-3 second pauses, the longer being toards the evening/overnight hours. Rates generally 60's  09/02/15: Echocardiogram Study Conclusions - Left ventricle: The cavity size was normal. Posterior wall   thickness was increased.. Systolic function was normal. The   estimated ejection fraction was in the range of 60% to 65%. Wall   motion was normal; there were no regional wall motion   abnormalities. Doppler parameters are consistent with abnormal   left ventricular relaxation (grade 1 diastolic dysfunction). - Aortic valve: There was trivial regurgitation. - Left atrium: The atrium was mildly dilated.   09/04/15: stress myoview  There was no ST segment deviation noted during stress.  Defect 1: There is a small defect of mild severity present in the apical anterior and apex location. This has minimal reversibility and likely due to breast attenuation. Mild ischemia can not be oxcluded  The study is probably normal.  This is a low risk study.  The left ventricular ejection fraction is normal (55-65%).  suboptimal study due to GI uptake and breast attenuation.  Assessment and Plan:   1. Near syncope     Brady episodes,  and brief pauses on her telemetry as well as AFib (CVR), 5 second pause on her event monitor      Historically known to be intolerant of rate controlling meds with bradycardia, has been told in the past she may require pacemaker     No reversible causes, her TSH is actually low, though she reports her endocrinologist for years told her though her TSH is low, her T3, T4 have been stable and this was OK for her. (noting a recent down-titration in her synthroid), her symptoms start far before this.     She has evidence of sinus node dysfunction and at this time is agreeable to the idea of a PPM implant.     Event monitor tracing in chart, 5 second pause observed     Will discuss further with Dr. Lovena Le  2. PAFib     CHA2DS2Vasc s at least 4, on warfarin (heparin here)  3. CAD     No CP     Recent stress low risk  4. Hypothyroid (s/p radio iodine ablation)     TSH is low, reportedly her synthroid adjusted only a couple months ago (symptoms started prior to this)     T4 2.08 (high), free T3 3.0 (wnl)     Will defer to her PMD out patient  4. HTN     controlled    Signed, Tommye Standard, PA-C 11/09/2015 9:41 AM  EP Attending  Patient seen and examined. Agree with the findings as noted above by Tommye Standard, PA-C. The patient is an obese woman admitted with recurrent dizzy spells and found to have long pauses. On exam, she is well appearing but overweight. RRR and clear lungs and no edema and no rebound. Neuro is normal. Tele reviewed demonstrating multiple pauses of up to 5 seconds with post termination from atrial fib.  A/P 1. PAF - she is in and out of rhythm. After PPM, consider additional AA drugs. She has already had an atrial fib ablation. 2. Sinus node dysfunction - I have discussed the risks/benefits/goals/expecations of PPM insertion and she wishes to proceed. 3. Obesity - she needs to lose weight. She may well have sleep apnea and this will need evaluating.  Mikle Bosworth.D.

## 2015-11-10 ENCOUNTER — Encounter (HOSPITAL_COMMUNITY)
Admission: EM | Disposition: A | Discharge status: Home / Self Care | Payer: Self-pay | Source: Home / Self Care | Attending: Cardiology

## 2015-11-10 ENCOUNTER — Encounter (HOSPITAL_COMMUNITY): Payer: Self-pay | Admitting: Cardiology

## 2015-11-10 DIAGNOSIS — I442 Atrioventricular block, complete: Secondary | ICD-10-CM

## 2015-11-10 HISTORY — PX: EP IMPLANTABLE DEVICE: SHX172B

## 2015-11-10 LAB — GLUCOSE, CAPILLARY
Glucose-Capillary: 169 mg/dL — ABNORMAL HIGH (ref 65–99)
Glucose-Capillary: 145 mg/dL — ABNORMAL HIGH (ref 65–99)
Glucose-Capillary: 181 mg/dL — ABNORMAL HIGH (ref 65–99)
Glucose-Capillary: 148 mg/dL — ABNORMAL HIGH (ref 65–99)

## 2015-11-10 LAB — CBC
HCT: 38.4 % (ref 36.0–46.0)
HEMOGLOBIN: 12.6 g/dL (ref 12.0–15.0)
MCH: 29.2 pg (ref 26.0–34.0)
MCHC: 32.8 g/dL (ref 30.0–36.0)
MCV: 88.9 fL (ref 78.0–100.0)
Platelets: 163 10*3/uL (ref 150–400)
RBC: 4.32 MIL/uL (ref 3.87–5.11)
RDW: 14.4 % (ref 11.5–15.5)
WBC: 6.3 10*3/uL (ref 4.0–10.5)

## 2015-11-10 LAB — SURGICAL PCR SCREEN
MRSA, PCR: NEGATIVE
Staphylococcus aureus: NEGATIVE

## 2015-11-10 LAB — HEPARIN LEVEL (UNFRACTIONATED): Heparin Unfractionated: <0.1 IU/mL — ABNORMAL LOW (ref 0.30–0.70)

## 2015-11-10 LAB — PROTIME-INR
INR: 1.41
Prothrombin Time: 17.4 seconds — ABNORMAL HIGH (ref 11.4–15.2)

## 2015-11-10 SURGERY — PACEMAKER IMPLANT

## 2015-11-10 MED ORDER — MIDAZOLAM HCL 5 MG/5ML IJ SOLN
INTRAMUSCULAR | Status: AC
  Filled 2015-11-10: qty 5

## 2015-11-10 MED ORDER — CEFAZOLIN SODIUM-DEXTROSE 2-3 GM-% IV SOLR
INTRAVENOUS | Status: DC | PRN
  Administered 2015-11-10: 2 g via INTRAVENOUS

## 2015-11-10 MED ORDER — ACETAMINOPHEN 325 MG PO TABS
325.0000 mg | ORAL_TABLET | ORAL | Status: DC | PRN
  Administered 2015-11-10 – 2015-11-11 (×3): 650 mg via ORAL
  Filled 2015-11-10 (×2): qty 2

## 2015-11-10 MED ORDER — WARFARIN - PHARMACIST DOSING INPATIENT
Freq: Every day | Status: DC
Start: 2015-11-10 — End: 2015-11-12
  Administered 2015-11-10: 18:00:00

## 2015-11-10 MED ORDER — WARFARIN SODIUM 7.5 MG PO TABS
7.5000 mg | ORAL_TABLET | Freq: Once | ORAL | Status: AC
  Administered 2015-11-10: 7.5 mg via ORAL
  Filled 2015-11-10: qty 1

## 2015-11-10 MED ORDER — ONDANSETRON HCL 4 MG/2ML IJ SOLN: 4.0000 mg | Freq: Four times a day (QID) | INTRAMUSCULAR | Status: DC | PRN

## 2015-11-10 MED ORDER — HEPARIN (PORCINE) IN NACL 2-0.9 UNIT/ML-% IJ SOLN
INTRAMUSCULAR | Status: AC
  Filled 2015-11-10: qty 500

## 2015-11-10 MED ORDER — LIDOCAINE HCL (PF) 1 % IJ SOLN
INTRAMUSCULAR | Status: AC
  Filled 2015-11-10: qty 60

## 2015-11-10 MED ORDER — CEFAZOLIN IN D5W 1 GM/50ML IV SOLN
1.0000 g | Freq: Four times a day (QID) | INTRAVENOUS | Status: AC
  Administered 2015-11-10 – 2015-11-11 (×3): 1 g via INTRAVENOUS
  Filled 2015-11-10 (×3): qty 50

## 2015-11-10 MED ORDER — LIDOCAINE HCL (PF) 1 % IJ SOLN
INTRAMUSCULAR | Status: DC | PRN
  Administered 2015-11-10: 60 mL

## 2015-11-10 MED ORDER — MIDAZOLAM HCL 5 MG/5ML IJ SOLN
INTRAMUSCULAR | Status: DC | PRN
  Administered 2015-11-10 (×4): 1 mg via INTRAVENOUS

## 2015-11-10 MED ORDER — CEFAZOLIN SODIUM-DEXTROSE 2-4 GM/100ML-% IV SOLN
INTRAVENOUS | Status: AC
  Filled 2015-11-10: qty 100

## 2015-11-10 MED ORDER — SODIUM CHLORIDE 0.9 % IR SOLN
Status: AC
  Filled 2015-11-10: qty 2

## 2015-11-10 MED ORDER — FENTANYL CITRATE (PF) 100 MCG/2ML IJ SOLN
INTRAMUSCULAR | Status: AC
  Filled 2015-11-10: qty 2

## 2015-11-10 MED ORDER — FENTANYL CITRATE (PF) 100 MCG/2ML IJ SOLN
INTRAMUSCULAR | Status: DC | PRN
  Administered 2015-11-10 (×2): 25 ug via INTRAVENOUS

## 2015-11-10 MED ORDER — HEPARIN (PORCINE) IN NACL 2-0.9 UNIT/ML-% IJ SOLN
INTRAMUSCULAR | Status: DC | PRN
  Administered 2015-11-10: 500 mL

## 2015-11-10 SURGICAL SUPPLY — 7 items
CABLE SURGICAL S-101-97-12 (CABLE) ×3 IMPLANT
LEAD TENDRIL MRI 46CM LPA1200M (Lead) ×3 IMPLANT
LEAD TENDRIL MRI 52CM LPA1200M (Lead) ×3 IMPLANT
PACEMAKER ASSURITY DR-RF (Pacemaker) ×3 IMPLANT
PAD DEFIB LIFELINK (PAD) ×3 IMPLANT
SHEATH CLASSIC 8F (SHEATH) ×6 IMPLANT
TRAY PACEMAKER INSERTION (PACKS) ×3 IMPLANT

## 2015-11-10 NOTE — Discharge Summary (Signed)
DISCHARGE SUMMARY    Patient ID: Dawn Garcia,  MRN: [id number], DOB/AGE: 1941/01/25 75 y.o.  Admit date: 11/08/2015 Discharge date: 11/12/15  Primary Care Physician: Adline Potter, MD Primary Cardiologist: Dr. Rockey Situ Electrophysiologist: Dr. Curt Bears  Primary Discharge Diagnosis:  1. Sick Sinus syndrome 2.  Symptomatic bradycardia  Secondary Discharge Diagnosis:  1. Hypothyroism 2. CAD 3. COPD 4. DM 5. HTN   Allergies  Allergen Reactions  . Demerol [Meperidine] Anaphylaxis    Tolerated Fentanyl 11/10/15  . Sulfa Antibiotics Anaphylaxis  . Tetracyclines & Related Other (See Comments)    Made nose, lips,  And tongue itchy     Procedures This Admission:  1.  Implantation of a SJM dual chamber PPM on 11/10/15 by Dr Curt Bears.  The patient received a [place] [id number] (serial number  [id number] ), with [place] [id number] (serial number  [id number]) right atrial lead and a [employer] model [id number] (serial number  [id number]) right ventricular lead.  There were no immediate post procedure complications. 2. Conclusion: Successful atrial lead revision in a patient with sinus node dysfunction status post permanent pacemaker insertion who had previously had dislodgment of her atrial lead 3.  CXR on 11/12/15 demonstrated no pneumothorax status post device implantation.   Brief HPI: Dawn Garcia is a 75 y.o. female was admitted to [facility] referred from her cardiologist's office for a 5 second pause observed on the event monitor she was wearing.  She had reported increasing number/frequency of weak spells, near syncope  And SOB.  Hospital Course:  The patient was admitted and observed on telemetry  PMHx of CAD  (remote stent, patent by cath 2014), PAFib reportedly with an ablation in 2013, COPD, DM, HLD, hypothyroidism, reported history of bradycardia.  She was monitored on telemetry throughout her stay noting PFib with CVR and transient  junctional rates30's-40's upon cvto SR and with SR pauses 1.5-3seconds.  She underwent PPM implant 11/10/15 with details above. POD#1  Her telemetry was monitored overnight post implant  which demonstrated .  Left chest was without hematoma or ecchymosis.  The device was interrogated post implant Day #1 and CXR noted RA lead had dislodged, she underwent Atrail lead revision yesterday by Dr. Lovena Le, device check this morning with normal function and CXR shows leads in place, no pneumothorax.  Telemetry overnight noted A paced, V sensed rhythm.  Wound care, arm mobility, and restrictions were reviewed with the patient.  The patient was examined by Dr. Curt Bears and considered stable for discharge to home. No changes to her last out patient warfarin regime is being made, she has a coumadin clinic visit scheduled for next week, and routine device f/u arranged as well.  We Hilliary Jock add Toprol XL 25mg  daily.  The patient has been instructed and Keyleigh Manninen f/u with her PMD regarding her thyroid replacement and management.   Physical Exam: Vitals:   11/11/15 1208 11/11/15 1232 11/11/15 1922 11/12/15 0627  BP: 129/77 (!) 108/59 (!) 129/52 (!) 142/64  Pulse: 60 60 60 60  Resp: 19 18 18 18   Temp:  97.5 F (36.4 C) 98.5 F (36.9 C) 97.6 F (36.4 C)  TempSrc:  Oral Oral Oral  SpO2: 96% 96% 98% 98%  Weight:    207 lb 14.4 oz (94.3 kg)  Height:        GEN- The patient is well appearing, alert and oriented x 3 today.   HEENT: normocephalic, atraumatic; sclera clear, conjunctiva pink; hearing intact;  oropharynx clear; neck supple, no JVP Lungs- Clear to ausculation bilaterally, normal work of breathing.  No wheezes, rales, rhonchi Heart- Regular rate and rhythm, no murmurs, rubs or gallops, PMI not laterally displaced GI- soft, non-tender, non-distended Extremities- no clubbing, cyanosis, or edema MS- no significant deformity or atrophy Skin- warm and dry, no rash or lesion, left chest without  hematoma/ecchymosis Psych- euthymic mood, full affect Neuro- no gross deficits   Labs:   Lab Results  Component Value Date   WBC 8.5 11/12/2015   HGB 12.7 11/12/2015   HCT 39.1 11/12/2015   MCV 88.9 11/12/2015   PLT 154 11/12/2015     Recent Labs Lab 11/08/15 1420  NA 140  K 4.0  CL 106  CO2 25  BUN 14  CREATININE 1.18*  CALCIUM 9.5  GLUCOSE 190*    Discharge Medications:    Medication List    TAKE these medications   allopurinol 100 MG tablet Commonly known as:  ZYLOPRIM Take 1 tablet (100 mg total) by mouth daily.   ARTIFICIAL TEAR OP Place 1 drop into both eyes as needed (for dry eyes).   furosemide 20 MG tablet Commonly known as:  LASIX Take 40mg  by mouth every other day alternating with 60mg  by mouth every other day.   isosorbide mononitrate 30 MG 24 hr tablet Commonly known as:  IMDUR Take 1 tablet (30 mg total) by mouth daily. 30 mg tablet   levothyroxine 125 MCG tablet Commonly known as:  SYNTHROID, LEVOTHROID Take 1 tablet (125 mcg total) by mouth daily. D/c order for 131mcg due to lab results- pt needs to take this dosing (163mcg)   lisinopril 2.5 MG tablet Commonly known as:  PRINIVIL,ZESTRIL Take 2.5 mg by mouth daily.   Melatonin 5 MG Tabs Take 1 tablet by mouth at bedtime.   metFORMIN 500 MG tablet Commonly known as:  GLUCOPHAGE Take 1 tablet (500 mg total) by mouth 2 (two) times daily with a meal.   metoprolol succinate 25 MG 24 hr tablet Commonly known as:  TOPROL XL Take 1 tablet (25 mg total) by mouth daily.   NITROSTAT 0.4 MG SL tablet Generic drug:  nitroGLYCERIN Place 0.4 mg under the tongue every 5 (five) minutes as needed for chest pain.   pantoprazole 40 MG tablet Commonly known as:  PROTONIX Take 1 tablet (40 mg total) by mouth daily.   potassium chloride 8 MEQ tablet Commonly known as:  KLOR-CON Take 1 tablet (8 mEq total) by mouth 2 (two) times daily.   PRESERVISION AREDS 2 PO Take 1 capsule by mouth 2 (two)  times daily.   rosuvastatin 40 MG tablet Commonly known as:  CRESTOR Take 1 tablet (40 mg total) by mouth daily.   sitaGLIPtin 100 MG tablet Commonly known as:  JANUVIA Take 1 tablet (100 mg total) by mouth daily.   vitamin B-12 500 MCG tablet Commonly known as:  CYANOCOBALAMIN Take 500 mcg by mouth daily.   Vitamin D3 5000 units Tabs Take 1 tablet by mouth every morning.   warfarin 7.5 MG tablet Commonly known as:  COUMADIN Take 1 tablet (7.5 mg total) by mouth daily.       Disposition:  Home Discharge Instructions    Diet - low sodium heart healthy    Complete by:  As directed   Increase activity slowly    Complete by:  As directed     Follow-up Information    [facility] Follow up on 11/23/2015.  Specialty:  Cardiology Why:  4:00PM, wound check Contact information: [address], [place] [phone]       Nyzir Dubois [name], MD Follow up on 02/11/2016.   Specialty:  Cardiology Why:  9:45AM Contact information: [address] [place] [address] [phone]        [facility] Follow up on 11/18/2015.   Specialty:  Cardiology Why:  11:30AM, coumadin clinic/lab draw Contact information: [address] [place] [phone]          Duration of Discharge Encounter: Greater than 30 minutes including physician time.  Venetia Night, PA-C 11/12/2015 8:36 AM   I have seen and examined this patient with Tommye Standard.  Agree with above, note added to reflect my findings.  On exam, regular rhythm, no murmurs, lungs clear. Had pauses noted on her event monitor. He had a dual chamber pacemaker placed with dislodgment of the right atrial lead. Lead revision was performed yesterday. Device interrogation and x-ray today showed no major abnormalities. Plan for discharge today with follow-up in device clinic.    Tiago Humphrey M. Geo Slone  MD 11/12/2015 9:06 AM

## 2015-11-10 NOTE — Progress Notes (Signed)
SUBJECTIVE: The patient is doing well today.  At this time, she denies chest pain, shortness of breath, or any new concerns.  Doug Sou Hold] allopurinol  100 mg Oral Daily  .  ceFAZolin (ANCEF) IV  2 g Intravenous On Call  . [MAR Hold] cholecalciferol  5,000 Units Oral Daily  . [MAR Hold] furosemide  40 mg Oral Daily  . gentamicin irrigation  80 mg Irrigation On Call  . [MAR Hold] insulin aspart  0-15 Units Subcutaneous TID WC  . [MAR Hold] isosorbide mononitrate  30 mg Oral Daily  . [MAR Hold] levothyroxine  125 mcg Oral QAC breakfast  . [MAR Hold] linagliptin  5 mg Oral Daily  . [MAR Hold] lisinopril  2.5 mg Oral Daily  . [MAR Hold] Melatonin  1 tablet Oral QHS  . [MAR Hold] multivitamin  1 tablet Oral BID  . [MAR Hold] pantoprazole  40 mg Oral Daily  . [MAR Hold] potassium chloride  8 mEq Oral BID  . [MAR Hold] rosuvastatin  40 mg Oral Daily  . sodium chloride flush  3 mL Intravenous Q12H  . [MAR Hold] vitamin B-12  500 mcg Oral Daily   . sodium chloride 50 mL/hr at 11/10/15 0618  . sodium chloride    . heparin Stopped (11/10/15 0000)    OBJECTIVE: Physical Exam: Vitals:   11/09/15 0940 11/09/15 1100 11/09/15 2024 11/10/15 0600  BP: (!) 151/62 (!) 143/60 (!) 120/50 (!) 144/65  Pulse: (!) 54 (!) 53 64 62  Resp:  18 18 18   Temp: 98.3 F (36.8 C) 98.1 F (36.7 C) 99.6 F (37.6 C) 98.5 F (36.9 C)  TempSrc: Oral Oral Oral Oral  SpO2: 96% 95% 93% 96%  Weight:    211 lb 12.8 oz (96.1 kg)  Height:        Intake/Output Summary (Last 24 hours) at 11/10/15 0719 Last data filed at 11/09/15 2300  Gross per 24 hour  Intake              240 ml  Output             2100 ml  Net            -1860 ml    Telemetry reveals sinus rhythm  GEN- The patient is well appearing, alert and oriented x 3 today.   Head- normocephalic, atraumatic Eyes-  Sclera clear, conjunctiva pink Ears- hearing intact Oropharynx- clear Neck- supple, no JVP Lymph- no cervical lymphadenopathy Lungs-  Clear to ausculation bilaterally, normal work of breathing Heart- Regular rate and rhythm, no murmurs, rubs or gallops, PMI not laterally displaced GI- soft, NT, ND, + BS Extremities- no clubbing, cyanosis, or edema Skin- no rash or lesion Psych- euthymic mood, full affect Neuro- strength and sensation are intact  LABS: Basic Metabolic Panel:  Recent Labs  11/08/15 1420  NA 140  K 4.0  CL 106  CO2 25  GLUCOSE 190*  BUN 14  CREATININE 1.18*  CALCIUM 9.5   Liver Function Tests: No results for input(s): AST, ALT, ALKPHOS, BILITOT, PROT, ALBUMIN in the last 72 hours. No results for input(s): LIPASE, AMYLASE in the last 72 hours. CBC:  Recent Labs  11/09/15 0518 11/10/15 0524  WBC 7.7 6.3  HGB 12.3 12.6  HCT 38.4 38.4  MCV 89.9 88.9  PLT 177 163   Cardiac Enzymes: No results for input(s): CKTOTAL, CKMB, CKMBINDEX, TROPONINI in the last 72 hours. BNP: Invalid input(s): POCBNP D-Dimer: No results for input(s): DDIMER in  the last 72 hours. Hemoglobin A1C: No results for input(s): HGBA1C in the last 72 hours. Fasting Lipid Panel: No results for input(s): CHOL, HDL, LDLCALC, TRIG, CHOLHDL, LDLDIRECT in the last 72 hours. Thyroid Function Tests:  Recent Labs  11/08/15 1720  TSH 0.061*  T3FREE 3.0   Anemia Panel: No results for input(s): VITAMINB12, FOLATE, FERRITIN, TIBC, IRON, RETICCTPCT in the last 72 hours.  RADIOLOGY: Dg Chest Portable 1 View  Result Date: 11/08/2015 CLINICAL DATA:  Abnormal heart rhythm.  Weakness. EXAM: PORTABLE CHEST 1 VIEW COMPARISON:  None. FINDINGS: Cardiac monitor projects over the left chest. Heart is borderline enlarged. No confluent airspace opacities or effusions. No acute bony abnormality. Prior right shoulder replacement. IMPRESSION: Borderline heart size.  No active disease. Electronically Signed   By: Rolm Baptise M.D.   On: 11/08/2015 15:19    ASSESSMENT AND PLAN:  Principal Problem:   Symptomatic bradycardia Active  Problems:   PAF (paroxysmal atrial fibrillation) (HCC)   Diabetes mellitus type 2, controlled, without complications ([place])   Long term (current) use of anticoagulants [Z79.01]   Hypothyroidism (acquired) 1. Near syncope     Brady episodes, and brief pauses on her telemetry as well as AFib (CVR), 5 second pause on her event monitor     No reversible causes.     She has evidence of sinus node dysfunction and at this time is agreeable to the idea of a PPM implant.     Risks and benefits have been discussed.  Risks include but are not limited to bleeding, infection, tamponade, pneumothorax.  The patient understands the risks and has agreed to the procedure.  2. PAFib     CHA2DS2Vasc s at least 4, on warfarin   3. CAD     No CP     Recent stress low risk  4. Hypothyroid (s/p radio iodine ablation)     TSH is low, reportedly her synthroid adjusted only a couple months ago (symptoms started prior to this)      4. HTN     controlled  Leauna Sharber [name], MD 11/10/2015 7:19 AM

## 2015-11-10 NOTE — Progress Notes (Signed)
ANTICOAGULATION CONSULT NOTE - Follow-up Consult  Pharmacy Consult for Heparin to Warfarin Indication: atrial fibrillation (on Coumadin PTA)  Allergies  Allergen Reactions  . Demerol [Meperidine] Anaphylaxis    Tolerated Fentanyl 11/10/15  . Sulfa Antibiotics Anaphylaxis  . Tetracyclines & Related Other (See Comments)    Made nose, lips,  And tongue itchy    Patient Measurements: Height: 5\' 3"  (160 cm) Weight: 211 lb 12.8 oz (96.1 kg) IBW/kg (Calculated) : 52.4 Heparin Dosing Weight: 76  Vital Signs: Temp: 98.5 F (36.9 C) (08/29 0600) Temp Source: Oral (08/29 0600) BP: 147/64 (08/29 1019) Pulse Rate: 60 (08/29 1019)  Labs:  Recent Labs  11/08/15 1420 11/08/15 1720 11/09/15 0518 11/09/15 1150 11/10/15 0524  HGB 14.0  --  12.3  --  12.6  HCT 42.4  --  38.4  --  38.4  PLT 191  --  177  --  163  LABPROT  --  20.4*  --   --  17.4*  INR  --  1.73  --   --  1.41  HEPARINUNFRC  --   --  0.47 0.69 <0.10*  CREATININE 1.18*  --   --   --   --     Estimated Creatinine Clearance: 46.2 mL/min (by C-G formula based on SCr of 1.18 mg/dL).   Assessment: 75 year old female on Coumadin PTA for Afib. Coumadin currently on hold for possible pacemaker placement. INR on admission = 1.73.   Heparin drip was stopped this morning in preparation of pacemaker placement today. Pharmacy is consulted to restart warfarin for atrial fibrillation.   PTA Warfarin Dose: 7.5mg /d with last dose 8/26  Goal of Therapy:  Heparin level 0.3-0.7 units/ml Monitor platelets by anticoagulation protocol: Yes   Plan:  Warfarin 7.5mg  tonight x1 Daily INR Monitor s/sx of bleeding  Andrey Cota. Diona Foley, PharmD, BCPS Clinical Pharmacist Pager [phone] 11/10/2015,10:54 AM

## 2015-11-10 NOTE — Discharge Instructions (Addendum)
Supplemental Discharge Instructions for  Pacemaker/Defibrillator Patients  Activity No heavy lifting or vigorous activity with your left/right arm for 6 to 8 weeks.  Do not raise your left/right arm above your head for one week.  Gradually raise your affected arm as drawn below.             11/16/15                       11/17/15                      11/18/15                    11/19/15 __  NO DRIVING for  1 week   ; you may begin driving on  [id number]   .  WOUND CARE - Keep the wound area clean and dry.  Do not get this area wet for one week. No showers for one week; you may shower on  11/19/15   . - The tape/steri-strips on your wound will fall off; do not pull them off.  No bandage is needed on the site.  DO  NOT apply any creams, oils, or ointments to the wound area. - If you notice any drainage or discharge from the wound, any swelling or bruising at the site, or you develop a fever > 101? F after you are discharged home, call the office at once.  Special Instructions - You are still able to use cellular telephones; use the ear opposite the side where you have your pacemaker/defibrillator.  Avoid carrying your cellular phone near your device. - When traveling through airports, show security personnel your identification card to avoid being screened in the metal detectors.  Ask the security personnel to use the hand wand. - Avoid arc welding equipment, MRI testing (magnetic resonance imaging), TENS units (transcutaneous nerve stimulators).  Call the office for questions about other devices. - Avoid electrical appliances that are in poor condition or are not properly grounded. - Microwave ovens are safe to be near or to operate.  Additional information for defibrillator patients should your device go off: - If your device goes off ONCE and you feel fine afterward, notify the device clinic nurses. - If your device goes off ONCE and you do not feel well afterward, call 911. - If your device goes  off TWICE, call 911. - If your device goes off THREE times in one day, call 911.  DO NOT DRIVE YOURSELF OR A FAMILY MEMBER WITH A DEFIBRILLATOR TO THE HOSPITAL--CALL 911.     Information on my medicine - Coumadin   (Warfarin)  This medication education was reviewed with me or my healthcare representative as part of my discharge preparation.    Why was Coumadin prescribed for you? Coumadin was prescribed for you because you have a blood clot or a medical condition that can cause an increased risk of forming blood clots. Blood clots can cause serious health problems by blocking the flow of blood to the heart, lung, or brain. Coumadin can prevent harmful blood clots from forming. As a reminder your indication for Coumadin is:   Stroke Prevention Because Of Atrial Fibrillation  What test will check on my response to Coumadin? While on Coumadin (warfarin) you will need to have an INR test regularly to ensure that your dose is keeping you in the desired range. The INR (international normalized ratio) number is calculated from the  result of the laboratory test called prothrombin time (PT).  If an INR APPOINTMENT HAS NOT ALREADY BEEN MADE FOR YOU please schedule an appointment to have this lab work done by your health care provider within 7 days. Your INR goal is usually a number between:  2 to 3 or your provider may give you a more narrow range like 2-2.5.  Ask your health care provider during an office visit what your goal INR is.  What  do you need to  know  About  COUMADIN? Take Coumadin (warfarin) exactly as prescribed by your healthcare provider about the same time each day.  DO NOT stop taking without talking to the doctor who prescribed the medication.  Stopping without other blood clot prevention medication to take the place of Coumadin may increase your risk of developing a new clot or stroke.  Get refills before you run out.  What do you do if you miss a dose? If you miss a dose, take  it as soon as you remember on the same day then continue your regularly scheduled regimen the next day.  Do not take two doses of Coumadin at the same time.  Important Safety Information A possible side effect of Coumadin (Warfarin) is an increased risk of bleeding. You should call your healthcare provider right away if you experience any of the following: ? Bleeding from an injury or your nose that does not stop. ? Unusual colored urine (red or dark brown) or unusual colored stools (red or black). ? Unusual bruising for unknown reasons. ? A serious fall or if you hit your head (even if there is no bleeding).  Some foods or medicines interact with Coumadin (warfarin) and might alter your response to warfarin. To help avoid this: ? Eat a balanced diet, maintaining a consistent amount of Vitamin K. ? Notify your provider about major diet changes you plan to make. ? Avoid alcohol or limit your intake to 1 drink for women and 2 drinks for men per day. (1 drink is 5 oz. wine, 12 oz. beer, or 1.5 oz. liquor.)  Make sure that ANY health care provider who prescribes medication for you knows that you are taking Coumadin (warfarin).  Also make sure the healthcare provider who is monitoring your Coumadin knows when you have started a new medication including herbals and non-prescription products.  Coumadin (Warfarin)  Major Drug Interactions  Increased Warfarin Effect Decreased Warfarin Effect  Alcohol (large quantities) Antibiotics (esp. Septra/Bactrim, Flagyl, Cipro) Amiodarone (Cordarone) Aspirin (ASA) Cimetidine (Tagamet) Megestrol (Megace) NSAIDs (ibuprofen, naproxen, etc.) Piroxicam (Feldene) Propafenone (Rythmol SR) Propranolol (Inderal) Isoniazid (INH) Posaconazole (Noxafil) Barbiturates (Phenobarbital) Carbamazepine (Tegretol) Chlordiazepoxide (Librium) Cholestyramine (Questran) Griseofulvin Oral Contraceptives Rifampin Sucralfate (Carafate) Vitamin K   Coumadin (Warfarin)  Major Herbal Interactions  Increased Warfarin Effect Decreased Warfarin Effect  Garlic Ginseng Ginkgo biloba Coenzyme Q10 Green tea St. Johns wort    Coumadin (Warfarin) FOOD Interactions  Eat a consistent number of servings per week of foods HIGH in Vitamin K (1 serving =  cup)  Collards (cooked, or boiled & drained) Kale (cooked, or boiled & drained) Mustard greens (cooked, or boiled & drained) Parsley *serving size only =  cup Spinach (cooked, or boiled & drained) Swiss chard (cooked, or boiled & drained) Turnip greens (cooked, or boiled & drained)  Eat a consistent number of servings per week of foods MEDIUM-HIGH in Vitamin K (1 serving = 1 cup)  Asparagus (cooked, or boiled & drained) Broccoli (cooked, boiled & drained, or raw &  chopped) Brussel sprouts (cooked, or boiled & drained) *serving size only =  cup Lettuce, raw (green leaf, endive, romaine) Spinach, raw Turnip greens, raw & chopped   These websites have more information on Coumadin (warfarin):  FailFactory.se; VeganReport.com.au;

## 2015-11-11 ENCOUNTER — Inpatient Hospital Stay (HOSPITAL_COMMUNITY): Payer: Medicare Other

## 2015-11-11 ENCOUNTER — Inpatient Hospital Stay (HOSPITAL_COMMUNITY)
Admission: EM | Disposition: A | Discharge status: Home / Self Care | Payer: Self-pay | Source: Home / Self Care | Attending: Cardiology

## 2015-11-11 ENCOUNTER — Encounter (HOSPITAL_COMMUNITY): Payer: Self-pay | Admitting: Internal Medicine

## 2015-11-11 DIAGNOSIS — T82120A Displacement of cardiac electrode, initial encounter: Secondary | ICD-10-CM

## 2015-11-11 HISTORY — PX: EP IMPLANTABLE DEVICE: SHX172B

## 2015-11-11 LAB — GLUCOSE, CAPILLARY
Glucose-Capillary: 164 mg/dL — ABNORMAL HIGH (ref 65–99)
Glucose-Capillary: 155 mg/dL — ABNORMAL HIGH (ref 65–99)
Glucose-Capillary: 184 mg/dL — ABNORMAL HIGH (ref 65–99)
Glucose-Capillary: 188 mg/dL — ABNORMAL HIGH (ref 65–99)

## 2015-11-11 LAB — PROTIME-INR
INR: 1.25
PROTHROMBIN TIME: 15.8 s — AB (ref 11.4–15.2)

## 2015-11-11 LAB — CBC
HCT: 38.9 % (ref 36.0–46.0)
Hemoglobin: 12.5 g/dL (ref 12.0–15.0)
MCH: 28.7 pg (ref 26.0–34.0)
MCHC: 32.1 g/dL (ref 30.0–36.0)
MCV: 89.4 fL (ref 78.0–100.0)
Platelets: 157 10*3/uL (ref 150–400)
RBC: 4.35 MIL/uL (ref 3.87–5.11)
RDW: 14.3 % (ref 11.5–15.5)
WBC: 7.8 10*3/uL (ref 4.0–10.5)

## 2015-11-11 SURGERY — LEAD REVISION/REPAIR
Anesthesia: LOCAL

## 2015-11-11 MED ORDER — LIDOCAINE HCL (PF) 1 % IJ SOLN
INTRAMUSCULAR | Status: AC
  Filled 2015-11-11: qty 60

## 2015-11-11 MED ORDER — SODIUM CHLORIDE 0.9 % IV SOLN: INTRAVENOUS | Status: DC

## 2015-11-11 MED ORDER — WARFARIN SODIUM 7.5 MG PO TABS
11.2500 mg | ORAL_TABLET | Freq: Once | ORAL | Status: AC
  Administered 2015-11-11: 11.25 mg via ORAL
  Filled 2015-11-11: qty 1.5

## 2015-11-11 MED ORDER — MIDAZOLAM HCL 5 MG/5ML IJ SOLN
INTRAMUSCULAR | Status: AC
  Filled 2015-11-11: qty 5

## 2015-11-11 MED ORDER — CHLORHEXIDINE GLUCONATE 4 % EX LIQD: 60.0000 mL | Freq: Once | CUTANEOUS | Status: DC

## 2015-11-11 MED ORDER — MIDAZOLAM HCL 5 MG/5ML IJ SOLN
INTRAMUSCULAR | Status: DC | PRN
  Administered 2015-11-11 (×3): 1 mg via INTRAVENOUS
  Administered 2015-11-11: 2 mg via INTRAVENOUS
  Administered 2015-11-11: 1 mg via INTRAVENOUS

## 2015-11-11 MED ORDER — CEFAZOLIN SODIUM-DEXTROSE 2-4 GM/100ML-% IV SOLN
INTRAVENOUS | Status: AC
  Filled 2015-11-11: qty 100

## 2015-11-11 MED ORDER — ONDANSETRON HCL 4 MG/2ML IJ SOLN: 4.0000 mg | Freq: Four times a day (QID) | INTRAMUSCULAR | Status: DC | PRN

## 2015-11-11 MED ORDER — FENTANYL CITRATE (PF) 100 MCG/2ML IJ SOLN
INTRAMUSCULAR | Status: DC | PRN
  Administered 2015-11-11 (×2): 25 ug via INTRAVENOUS

## 2015-11-11 MED ORDER — SODIUM CHLORIDE 0.9 % IR SOLN
Status: AC
  Filled 2015-11-11: qty 2

## 2015-11-11 MED ORDER — CEFAZOLIN SODIUM-DEXTROSE 2-4 GM/100ML-% IV SOLN
2.0000 g | INTRAVENOUS | Status: AC
  Administered 2015-11-11: 2 g via INTRAVENOUS
  Filled 2015-11-11: qty 100

## 2015-11-11 MED ORDER — LIDOCAINE HCL (PF) 1 % IJ SOLN
INTRAMUSCULAR | Status: DC | PRN
  Administered 2015-11-11: 50 mL

## 2015-11-11 MED ORDER — CEFAZOLIN SODIUM-DEXTROSE 2-4 GM/100ML-% IV SOLN
2.0000 g | Freq: Four times a day (QID) | INTRAVENOUS | Status: AC
  Administered 2015-11-11 (×3): 2 g via INTRAVENOUS
  Filled 2015-11-11 (×3): qty 100

## 2015-11-11 MED ORDER — GENTAMICIN SULFATE 40 MG/ML IJ SOLN
80.0000 mg | INTRAMUSCULAR | Status: DC
  Filled 2015-11-11: qty 2

## 2015-11-11 MED ORDER — ACETAMINOPHEN 325 MG PO TABS
325.0000 mg | ORAL_TABLET | ORAL | Status: DC | PRN
  Filled 2015-11-11: qty 2

## 2015-11-11 MED ORDER — FENTANYL CITRATE (PF) 100 MCG/2ML IJ SOLN
INTRAMUSCULAR | Status: AC
  Filled 2015-11-11: qty 2

## 2015-11-11 SURGICAL SUPPLY — 3 items
CABLE SURGICAL S-101-97-12 (CABLE) ×2 IMPLANT
PAD DEFIB LIFELINK (PAD) ×2 IMPLANT
TRAY PACEMAKER INSERTION (PACKS) ×2 IMPLANT

## 2015-11-11 NOTE — H&P (View-Only) (Signed)
SUBJECTIVE: The patient is doing well today.  At this time, she denies chest pain, shortness of breath, or any new concerns.  Mild discomfort at patient.    Marland Kitchen allopurinol  100 mg Oral Daily  . cholecalciferol  5,000 Units Oral Daily  . furosemide  40 mg Oral Daily  . insulin aspart  0-15 Units Subcutaneous TID WC  . isosorbide mononitrate  30 mg Oral Daily  . levothyroxine  125 mcg Oral QAC breakfast  . linagliptin  5 mg Oral Daily  . lisinopril  2.5 mg Oral Daily  . Melatonin  1 tablet Oral QHS  . multivitamin  1 tablet Oral BID  . pantoprazole  40 mg Oral Daily  . potassium chloride  8 mEq Oral BID  . rosuvastatin  40 mg Oral Daily  . vitamin B-12  500 mcg Oral Daily  . warfarin  11.25 mg Oral ONCE-1800  . Warfarin - Pharmacist Dosing Inpatient   Does not apply q1800      OBJECTIVE: Physical Exam: Vitals:   11/10/15 1245 11/10/15 1400 11/10/15 1947 11/11/15 0529  BP: 119/69 132/69 (!) 126/56 (!) 144/69  Pulse: 63 64 70 (!) 59  Resp:   18 18  Temp:   98.7 F (37.1 C) 97.5 F (36.4 C)  TempSrc:   Oral Oral  SpO2:   95% 97%  Weight:    208 lb 11.2 oz (94.7 kg)  Height:        Intake/Output Summary (Last 24 hours) at 11/11/15 0858 Last data filed at 11/11/15 0600  Gross per 24 hour  Intake              530 ml  Output             1850 ml  Net            -1320 ml    Telemetry reveals sinus rhythm  GEN- The patient is well appearing, alert and oriented x 3 today.   Head- normocephalic, atraumatic Eyes-  Sclera clear, conjunctiva pink Ears- hearing intact Oropharynx- clear Neck- supple, no JVP Lymph- no cervical lymphadenopathy Lungs- Clear to ausculation bilaterally, normal work of breathing Heart- Regular rate and rhythm, no murmurs, rubs or gallops, PMI not laterally displaced GI- soft, NT, ND Extremities- no clubbing, cyanosis, or edema Skin- no rash or lesion Psych- euthymic mood, full affect Neuro- strength and sensation are intact PPM site is dry, no  hematoma  LABS: Basic Metabolic Panel:  Recent Labs  11/08/15 1420  NA 140  K 4.0  CL 106  CO2 25  GLUCOSE 190*  BUN 14  CREATININE 1.18*  CALCIUM 9.5   Liver Function Tests: No results for input(s): AST, ALT, ALKPHOS, BILITOT, PROT, ALBUMIN in the last 72 hours. No results for input(s): LIPASE, AMYLASE in the last 72 hours. CBC:  Recent Labs  11/10/15 0524 11/11/15 0314  WBC 6.3 7.8  HGB 12.6 12.5  HCT 38.4 38.9  MCV 88.9 89.4  PLT 163 157    Thyroid Function Tests:  Recent Labs  11/08/15 1720  TSH 0.061*  T3FREE 3.0    RADIOLOGY: Dg Chest 2 View Result Date: 11/11/2015 CLINICAL DATA:  Cardiac arrhythmia with pacemaker placement EXAM: CHEST  2 VIEW COMPARISON:  November 08, 2015 FINDINGS: Pacemaker now present with lead tips attached to the right atrium and middle cardiac vein. No pneumothorax. There is slight atelectasis in the left lower lobe. Lungs elsewhere are clear. Heart size and pulmonary vascularity are normal.  There is atherosclerotic calcification in the aorta. No adenopathy. Patient is status post total shoulder replacement on the right. IMPRESSION: Pacemaker leads are attached to the right atrium and middle cardiac vein. No pneumothorax. Mild atelectasis left base. Lungs elsewhere clear. There is aortic atherosclerosis. Electronically Signed   By: Lowella Grip III M.D.   On: 11/11/2015 07:15    ASSESSMENT AND PLAN:  1. Near syncope     Brady episodes, and brief pauses on her telemetry as well as AFib (CVR), 5 second pause on her event monitor     No reversible causes.     She has evidence of sinus node dysfunction and at this time is agreeable to the idea of a PPM implant.     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 Curt Bears discussed with the pt, she  understands need for lead revision procedure and is willing to proceed  2. PAFib     CHA2DS2Vasc s at least 4, on warfarin      INR today 1.25  3. CAD     No CP      Recent stress low risk  4. Hypothyroid (s/p radio iodine ablation)     TSH is low, reportedly her synthroid adjusted only a couple months ago (symptoms started prior to this)      4. HTN     controlled  Baldwin Jamaica, MD 11/11/2015 8:58 AM  EP Attending Patient seen and examined. Chest x-ray reviewed. She has an atrial lead dislodgment. I discussed the treatment options with the patient. Plan to proceed with repositioning of the atrial lead. The risks, benefits, goals, and expectations of the procedure have been reviewed and she wishes to proceed.  Cristopher Peru, M.D.

## 2015-11-11 NOTE — Progress Notes (Signed)
ANTICOAGULATION CONSULT NOTE - Follow-up Consult  Pharmacy Consult for Heparin to Warfarin Indication: atrial fibrillation (on Coumadin PTA)  Allergies  Allergen Reactions  . Demerol [Meperidine] Anaphylaxis    Tolerated Fentanyl 11/10/15  . Sulfa Antibiotics Anaphylaxis  . Tetracyclines & Related Other (See Comments)    Made nose, lips,  And tongue itchy    Patient Measurements: Height: 5\' 3"  (160 cm) Weight: 208 lb 11.2 oz (94.7 kg) (scale a) IBW/kg (Calculated) : 52.4 Heparin Dosing Weight: 76  Vital Signs: Temp: 97.5 F (36.4 C) (08/30 0529) Temp Source: Oral (08/30 0529) BP: 144/69 (08/30 0529) Pulse Rate: 59 (08/30 0529)  Labs:  Recent Labs  11/08/15 1420 11/08/15 1720 11/09/15 0518 11/09/15 1150 11/10/15 0524 11/11/15 0314  HGB 14.0  --  12.3  --  12.6 12.5  HCT 42.4  --  38.4  --  38.4 38.9  PLT 191  --  177  --  163 157  LABPROT  --  20.4*  --   --  17.4* 15.8*  INR  --  1.73  --   --  1.41 1.25  HEPARINUNFRC  --   --  0.47 0.69 <0.10*  --   CREATININE 1.18*  --   --   --   --   --     Estimated Creatinine Clearance: 45.8 mL/min (by C-G formula based on SCr of 1.18 mg/dL).   Assessment: 75 year old female on Coumadin PTA for Afib. Coumadin currently on hold for possible pacemaker placement. INR on admission = 1.73.  Pharmacy is consulted to restart warfarin for atrial fibrillation. INR is trending down. Will give 50% increase for booster dose tonight.  PTA Warfarin Dose: 7.5mg /d with last dose 8/26  Goal of Therapy:  Heparin level 0.3-0.7 units/ml Monitor platelets by anticoagulation protocol: Yes   Plan:  Warfarin 11.25mg  tonight x1 Daily INR Monitor s/sx of bleeding  Andrey Cota. Diona Foley, PharmD, BCPS Clinical Pharmacist Pager [phone] 11/11/2015,8:45 AM

## 2015-11-11 NOTE — Progress Notes (Signed)
Pt slept well overnight, vitals stable, pulse maintained at 60's, incision site is CDI, no any complain of pain, SOB and distress noted,  IVABX continue, will continue to monitor the patient.

## 2015-11-11 NOTE — Progress Notes (Signed)
SUBJECTIVE: The patient is doing well today.  At this time, she denies chest pain, shortness of breath, or any new concerns.  Mild discomfort at patient.    Marland Kitchen allopurinol  100 mg Oral Daily  . cholecalciferol  5,000 Units Oral Daily  . furosemide  40 mg Oral Daily  . insulin aspart  0-15 Units Subcutaneous TID WC  . isosorbide mononitrate  30 mg Oral Daily  . levothyroxine  125 mcg Oral QAC breakfast  . linagliptin  5 mg Oral Daily  . lisinopril  2.5 mg Oral Daily  . Melatonin  1 tablet Oral QHS  . multivitamin  1 tablet Oral BID  . pantoprazole  40 mg Oral Daily  . potassium chloride  8 mEq Oral BID  . rosuvastatin  40 mg Oral Daily  . vitamin B-12  500 mcg Oral Daily  . warfarin  11.25 mg Oral ONCE-1800  . Warfarin - Pharmacist Dosing Inpatient   Does not apply q1800      OBJECTIVE: Physical Exam: Vitals:   11/10/15 1245 11/10/15 1400 11/10/15 1947 11/11/15 0529  BP: 119/69 132/69 (!) 126/56 (!) 144/69  Pulse: 63 64 70 (!) 59  Resp:   18 18  Temp:   98.7 F (37.1 C) 97.5 F (36.4 C)  TempSrc:   Oral Oral  SpO2:   95% 97%  Weight:    208 lb 11.2 oz (94.7 kg)  Height:        Intake/Output Summary (Last 24 hours) at 11/11/15 0858 Last data filed at 11/11/15 0600  Gross per 24 hour  Intake              530 ml  Output             1850 ml  Net            -1320 ml    Telemetry reveals sinus rhythm  GEN- The patient is well appearing, alert and oriented x 3 today.   Head- normocephalic, atraumatic Eyes-  Sclera clear, conjunctiva pink Ears- hearing intact Oropharynx- clear Neck- supple, no JVP Lymph- no cervical lymphadenopathy Lungs- Clear to ausculation bilaterally, normal work of breathing Heart- Regular rate and rhythm, no murmurs, rubs or gallops, PMI not laterally displaced GI- soft, NT, ND Extremities- no clubbing, cyanosis, or edema Skin- no rash or lesion Psych- euthymic mood, full affect Neuro- strength and sensation are intact PPM site is dry, no  hematoma  LABS: Basic Metabolic Panel:  Recent Labs  11/08/15 1420  NA 140  K 4.0  CL 106  CO2 25  GLUCOSE 190*  BUN 14  CREATININE 1.18*  CALCIUM 9.5   Liver Function Tests: No results for input(s): AST, ALT, ALKPHOS, BILITOT, PROT, ALBUMIN in the last 72 hours. No results for input(s): LIPASE, AMYLASE in the last 72 hours. CBC:  Recent Labs  11/10/15 0524 11/11/15 0314  WBC 6.3 7.8  HGB 12.6 12.5  HCT 38.4 38.9  MCV 88.9 89.4  PLT 163 157    Thyroid Function Tests:  Recent Labs  11/08/15 1720  TSH 0.061*  T3FREE 3.0    RADIOLOGY: Dg Chest 2 View Result Date: 11/11/2015 CLINICAL DATA:  Cardiac arrhythmia with pacemaker placement EXAM: CHEST  2 VIEW COMPARISON:  November 08, 2015 FINDINGS: Pacemaker now present with lead tips attached to the right atrium and middle cardiac vein. No pneumothorax. There is slight atelectasis in the left lower lobe. Lungs elsewhere are clear. Heart size and pulmonary vascularity are normal.  There is atherosclerotic calcification in the aorta. No adenopathy. Patient is status post total shoulder replacement on the right. IMPRESSION: Pacemaker leads are attached to the right atrium and middle cardiac vein. No pneumothorax. Mild atelectasis left base. Lungs elsewhere clear. There is aortic atherosclerosis. Electronically Signed   By: Lowella Grip III M.D.   On: 11/11/2015 07:15    ASSESSMENT AND PLAN:  1. Near syncope     Brady episodes, and brief pauses on her telemetry as well as AFib (CVR), 5 second pause on her event monitor     No reversible causes.     She has evidence of sinus node dysfunction and at this time is agreeable to the idea of a PPM implant.     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 Curt Bears discussed with the pt, she  understands need for lead revision procedure and is willing to proceed  2. PAFib     CHA2DS2Vasc s at least 4, on warfarin      INR today 1.25  3. CAD     No CP      Recent stress low risk  4. Hypothyroid (s/p radio iodine ablation)     TSH is low, reportedly her synthroid adjusted only a couple months ago (symptoms started prior to this)      4. HTN     controlled  Baldwin Jamaica, MD 11/11/2015 8:58 AM  EP Attending Patient seen and examined. Chest x-ray reviewed. She has an atrial lead dislodgment. I discussed the treatment options with the patient. Plan to proceed with repositioning of the atrial lead. The risks, benefits, goals, and expectations of the procedure have been reviewed and she wishes to proceed.  Cristopher Peru, M.D.

## 2015-11-11 NOTE — Progress Notes (Signed)
Pt returned from cath lab, family at the bedside.

## 2015-11-11 NOTE — Interval H&P Note (Signed)
History and Physical Interval Note:  11/11/2015 12:06 PM  Dawn Garcia  has presented today for surgery, with the diagnosis of lead failure  The various methods of treatment have been discussed with the patient and family. After consideration of risks, benefits and other options for treatment, the patient has consented to  Procedure(s): Lead Revision/Repair (N/A) as a surgical intervention .  The patient's history has been reviewed, patient examined, no change in status, stable for surgery.  I have reviewed the patient's chart and labs.  Questions were answered to the patient's satisfaction.     Cristopher Peru

## 2015-11-11 NOTE — Progress Notes (Signed)
Patient refused bed alarm. Will continue to monitor patient. 

## 2015-11-12 ENCOUNTER — Inpatient Hospital Stay (HOSPITAL_COMMUNITY): Payer: Medicare Other

## 2015-11-12 LAB — CBC
HCT: 39.1 % (ref 36.0–46.0)
Hemoglobin: 12.7 g/dL (ref 12.0–15.0)
MCH: 28.9 pg (ref 26.0–34.0)
MCHC: 32.5 g/dL (ref 30.0–36.0)
MCV: 88.9 fL (ref 78.0–100.0)
PLATELETS: 154 10*3/uL (ref 150–400)
RBC: 4.4 MIL/uL (ref 3.87–5.11)
RDW: 14.6 % (ref 11.5–15.5)
WBC: 8.5 10*3/uL (ref 4.0–10.5)

## 2015-11-12 LAB — GLUCOSE, CAPILLARY: Glucose-Capillary: 176 mg/dL — ABNORMAL HIGH (ref 65–99)

## 2015-11-12 LAB — PROTIME-INR
INR: 1.37
PROTHROMBIN TIME: 17 s — AB (ref 11.4–15.2)

## 2015-11-12 MED ORDER — METOPROLOL SUCCINATE ER 25 MG PO TB24: 25.0000 mg | ORAL_TABLET | Freq: Every day | ORAL | 4 refills | Status: DC

## 2015-11-12 MED ORDER — WARFARIN SODIUM 7.5 MG PO TABS: 11.2500 mg | ORAL_TABLET | Freq: Once | ORAL | Status: DC

## 2015-11-12 MED FILL — Gentamicin Sulfate Inj 40 MG/ML: INTRAMUSCULAR | Qty: 2 | Status: AC

## 2015-11-12 MED FILL — Sodium Chloride Irrigation Soln 0.9%: Qty: 500 | Status: AC

## 2015-11-12 NOTE — Care Management Important Message (Signed)
Important Message  Patient Details  Name: Dawn Garcia MRN: [id number] Date of Birth: [birth year]   Medicare Important Message Given:  Yes    Lenny Bouchillon 11/12/2015, 10:30 AM

## 2015-11-12 NOTE — Progress Notes (Addendum)
ANTICOAGULATION CONSULT NOTE - Follow-up Consult  Pharmacy Consult for Heparin to Warfarin Indication: atrial fibrillation (on Coumadin PTA)  Allergies  Allergen Reactions  . Demerol [Meperidine] Anaphylaxis    Tolerated Fentanyl 11/10/15  . Sulfa Antibiotics Anaphylaxis  . Tetracyclines & Related Other (See Comments)    Made nose, lips,  And tongue itchy    Patient Measurements: Height: 5\' 3"  (160 cm) Weight: 207 lb 14.4 oz (94.3 kg) (scale a) IBW/kg (Calculated) : 52.4 Heparin Dosing Weight: 76  Vital Signs: Temp: 98.1 F (36.7 C) (08/31 0800) Temp Source: Oral (08/31 0800) BP: 131/61 (08/31 0800) Pulse Rate: 60 (08/31 0627)  Labs:  Recent Labs  11/09/15 1150  11/10/15 0524 11/11/15 0314 11/12/15 0215  HGB  --   < > 12.6 12.5 12.7  HCT  --   --  38.4 38.9 39.1  PLT  --   --  163 157 154  LABPROT  --   --  17.4* 15.8* 17.0*  INR  --   --  1.41 1.25 1.37  HEPARINUNFRC 0.69  --  <0.10*  --   --   < > = values in this interval not displayed.  Estimated Creatinine Clearance: 45.7 mL/min (by C-G formula based on SCr of 1.18 mg/dL).   Assessment: 75 year old female on Coumadin PTA for Afib. Coumadin currently on hold for possible pacemaker placement. INR on admission = 1.73.  Pharmacy is consulted to restart warfarin for atrial fibrillation. INR is trending back up. Will give an additional 50% increase for booster dose tonight and likely resume home regimen tomorrow.  PTA Warfarin Dose: 7.5mg /d with last dose 8/26  Goal of Therapy:  INR 2-3 Monitor platelets by anticoagulation protocol: Yes   Plan:  Warfarin 11.25mg  tonight x1 Daily INR Monitor s/sx of bleeding  Andrey Cota. Diona Foley, PharmD, BCPS Clinical Pharmacist Pager [phone] 11/12/2015,8:53 AM

## 2015-11-17 ENCOUNTER — Other Ambulatory Visit: Payer: Self-pay

## 2015-11-18 ENCOUNTER — Ambulatory Visit (INDEPENDENT_AMBULATORY_CARE_PROVIDER_SITE_OTHER): Payer: Medicare Other

## 2015-11-18 DIAGNOSIS — Z7901 Long term (current) use of anticoagulants: Secondary | ICD-10-CM

## 2015-11-18 DIAGNOSIS — I480 Paroxysmal atrial fibrillation: Secondary | ICD-10-CM | POA: Diagnosis not present

## 2015-11-18 DIAGNOSIS — I4891 Unspecified atrial fibrillation: Secondary | ICD-10-CM | POA: Diagnosis not present

## 2015-11-18 LAB — POCT INR: INR: 2.3

## 2015-11-20 ENCOUNTER — Other Ambulatory Visit: Payer: Self-pay | Admitting: Internal Medicine

## 2015-11-20 DIAGNOSIS — H26491 Other secondary cataract, right eye: Secondary | ICD-10-CM | POA: Diagnosis not present

## 2015-11-23 ENCOUNTER — Ambulatory Visit (INDEPENDENT_AMBULATORY_CARE_PROVIDER_SITE_OTHER): Payer: Medicare Other | Admitting: *Deleted

## 2015-11-23 ENCOUNTER — Encounter: Payer: Self-pay | Admitting: Cardiology

## 2015-11-23 ENCOUNTER — Other Ambulatory Visit: Payer: Self-pay

## 2015-11-23 DIAGNOSIS — I4891 Unspecified atrial fibrillation: Secondary | ICD-10-CM | POA: Diagnosis not present

## 2015-11-23 LAB — CUP PACEART INCLINIC DEVICE CHECK
Brady Statistic RA Percent Paced: 86 %
Date Time Interrogation Session: 20170911174829
Implantable Lead Location: 753859
Implantable Lead Location: 753860
Lead Channel Impedance Value: 487.5 Ohm
Lead Channel Sensing Intrinsic Amplitude: 12 mV
Lead Channel Sensing Intrinsic Amplitude: 5 mV
Lead Channel Setting Pacing Pulse Width: 0.4 ms
MDC IDC LEAD IMPLANT DT: 20170829
MDC IDC LEAD IMPLANT DT: 20170829
MDC IDC MSMT BATTERY REMAINING LONGEVITY: 115.2
MDC IDC MSMT BATTERY VOLTAGE: 3.11 V
MDC IDC MSMT LEADCHNL RA PACING THRESHOLD AMPLITUDE: 1 V
MDC IDC MSMT LEADCHNL RA PACING THRESHOLD PULSEWIDTH: 0.4 ms
MDC IDC MSMT LEADCHNL RV IMPEDANCE VALUE: 662.5 Ohm
MDC IDC MSMT LEADCHNL RV PACING THRESHOLD AMPLITUDE: 0.5 V
MDC IDC MSMT LEADCHNL RV PACING THRESHOLD PULSEWIDTH: 0.4 ms
MDC IDC PG SERIAL: 7945290
MDC IDC SET LEADCHNL RA PACING AMPLITUDE: 2 V
MDC IDC SET LEADCHNL RV PACING AMPLITUDE: 0.75 V
MDC IDC SET LEADCHNL RV SENSING SENSITIVITY: 2 mV
MDC IDC STAT BRADY RV PERCENT PACED: 87 %
Pulse Gen Model: 2272

## 2015-11-23 NOTE — Progress Notes (Signed)
Wound check appointment. Steri-strips removed. Wound without redness or edema. Incision edges approximated, wound well healed. Stitches clipped on left and right lateral incision. Antibiotic ointment applied with band aid. Patient instructed to remove bandaid in 24hr. Normal device function. Thresholds, sensing, and impedances consistent with implant measurements. Device programmed at auto capture. Histogram distribution appropriate for patient and level of activity. 2 mode switches, longest lasting 18hr, peak A 640 (6.6% burden) + coumadin. No high ventricular rates noted. Patient educated about wound care, arm mobility, lifting restrictions. ROV 11/30 with WC.

## 2015-12-02 ENCOUNTER — Ambulatory Visit (INDEPENDENT_AMBULATORY_CARE_PROVIDER_SITE_OTHER): Payer: Medicare Other | Admitting: *Deleted

## 2015-12-02 DIAGNOSIS — Z7901 Long term (current) use of anticoagulants: Secondary | ICD-10-CM | POA: Diagnosis not present

## 2015-12-02 DIAGNOSIS — I4891 Unspecified atrial fibrillation: Secondary | ICD-10-CM

## 2015-12-02 DIAGNOSIS — I480 Paroxysmal atrial fibrillation: Secondary | ICD-10-CM | POA: Diagnosis not present

## 2015-12-02 LAB — POCT INR: INR: 2.4

## 2015-12-07 ENCOUNTER — Other Ambulatory Visit: Payer: Self-pay

## 2015-12-07 DIAGNOSIS — H43813 Vitreous degeneration, bilateral: Secondary | ICD-10-CM | POA: Diagnosis not present

## 2015-12-07 DIAGNOSIS — H353112 Nonexudative age-related macular degeneration, right eye, intermediate dry stage: Secondary | ICD-10-CM | POA: Diagnosis not present

## 2015-12-07 DIAGNOSIS — H353221 Exudative age-related macular degeneration, left eye, with active choroidal neovascularization: Secondary | ICD-10-CM | POA: Diagnosis not present

## 2015-12-08 ENCOUNTER — Encounter: Payer: Self-pay | Admitting: Internal Medicine

## 2015-12-08 ENCOUNTER — Ambulatory Visit (INDEPENDENT_AMBULATORY_CARE_PROVIDER_SITE_OTHER): Payer: Medicare Other | Admitting: Internal Medicine

## 2015-12-08 VITALS — BP 126/86 | HR 74 | Resp 16 | Ht 63.0 in | Wt 215.0 lb

## 2015-12-08 DIAGNOSIS — N183 Chronic kidney disease, stage 3 unspecified: Secondary | ICD-10-CM

## 2015-12-08 DIAGNOSIS — E1121 Type 2 diabetes mellitus with diabetic nephropathy: Secondary | ICD-10-CM | POA: Diagnosis not present

## 2015-12-08 DIAGNOSIS — J449 Chronic obstructive pulmonary disease, unspecified: Secondary | ICD-10-CM

## 2015-12-08 DIAGNOSIS — Z23 Encounter for immunization: Secondary | ICD-10-CM | POA: Diagnosis not present

## 2015-12-08 DIAGNOSIS — E039 Hypothyroidism, unspecified: Secondary | ICD-10-CM | POA: Diagnosis not present

## 2015-12-08 DIAGNOSIS — I2510 Atherosclerotic heart disease of native coronary artery without angina pectoris: Secondary | ICD-10-CM | POA: Diagnosis not present

## 2015-12-08 DIAGNOSIS — M1000 Idiopathic gout, unspecified site: Secondary | ICD-10-CM | POA: Diagnosis not present

## 2015-12-08 DIAGNOSIS — E890 Postprocedural hypothyroidism: Secondary | ICD-10-CM | POA: Diagnosis not present

## 2015-12-08 MED ORDER — LEVOTHYROXINE SODIUM 150 MCG PO TABS: 150.0000 ug | ORAL_TABLET | Freq: Every day | ORAL | 1 refills | Status: DC

## 2015-12-08 NOTE — Progress Notes (Signed)
Date:  12/08/2015   Name:  Dawn Garcia   DOB:  [birth year]   MRN:  [id number]   Chief Complaint: Diabetes (BS 150-170) and Heart Problem (Palpitations since Pacemaker ) Diabetes  She presents for her follow-up diabetic visit. She has type 2 diabetes mellitus. Her disease course has been stable. Pertinent negatives for hypoglycemia include no dizziness or headaches. Pertinent negatives for diabetes include no chest pain, no fatigue, no foot paresthesias, no polydipsia and no polyuria. Symptoms are stable. Current diabetic treatment includes oral agent (dual therapy). She is compliant with treatment most of the time.  Heart Problem  This is a chronic problem. The problem has been gradually worsening (bradycardia - now has pacemaker). Associated symptoms include coughing. Pertinent negatives include no abdominal pain, chest pain, chills, diaphoresis, fatigue, fever or headaches.  Thyroid Problem  Presents for follow-up visit. Patient reports no depressed mood, diaphoresis, fatigue, leg swelling, palpitations or weight gain. The symptoms have been stable (s/p thyroid ablation - can not use TSH for dosing).   Gout - hx of recurrent gout.  Has been on allopurinol daily for prevention.  No recent episodes since getting Uric acid under control.  Recent decrease in renal function needs to be monitored.  She is in the donut hole for medications now.  Januvia is costing her $300 per month.  However, she had gotten a 3 month supply so has enough until November.  She is wondering if there is another medication she can take.    Lab Results  Component Value Date   HGBA1C 6.9 (H) 08/19/2015    Review of Systems  Constitutional: Negative for chills, diaphoresis, fatigue, fever and weight gain.  Eyes: Positive for visual disturbance.  Respiratory: Positive for cough, shortness of breath and wheezing. Negative for chest tightness.   Cardiovascular: Negative for chest pain, palpitations and leg  swelling.  Gastrointestinal: Negative for abdominal pain and blood in stool.  Endocrine: Negative for polydipsia and polyuria.  Neurological: Negative for dizziness and headaches.  Hematological: Negative for adenopathy.    Patient Active Problem List   Diagnosis Date Noted  . Hyperlipidemia 09/18/2015  . Symptomatic bradycardia 09/18/2015  . Coronary artery disease 09/10/2015  . B12 deficiency 09/10/2015  . History of melanoma 09/09/2015  . COPD (chronic obstructive pulmonary disease) ([place]) 09/09/2015  . Hypothyroidism following radioiodine therapy 09/09/2015  . Long term (current) use of anticoagulants [Z79.01] 08/26/2015  . Obesity, Class II, BMI 35-39.9 08/20/2015  . GERD with stricture 08/20/2015  . Macular degeneration 08/20/2015  . Diastolic CHF ([place]) 123456  . History of parathyroidectomy 08/20/2015  . CKD (chronic kidney disease) stage 3, GFR 30-59 ml/min 08/20/2015  . PAF (paroxysmal atrial fibrillation) ([place]) 08/19/2015  . Controlled type 2 diabetes mellitus with diabetic nephropathy, without long-term current use of insulin ([place]) 08/19/2015  . Primary gout 08/19/2015    Prior to Admission medications   Medication Sig Start Date End Date Taking? Authorizing Provider  allopurinol (ZYLOPRIM) 100 MG tablet Take 1 tablet (100 mg total) by mouth daily. 09/16/15  Yes Adline Potter, MD  ARTIFICIAL TEAR OP Place 1 drop into both eyes as needed (for dry eyes).   Yes Historical Provider, MD  Cholecalciferol (VITAMIN D3) 5000 units TABS Take 1 tablet by mouth every morning.   Yes Historical Provider, MD  furosemide (LASIX) 20 MG tablet Take 40mg  by mouth every other day alternating with 60mg  by mouth every other day. 09/16/15  Yes Adline Potter, MD  isosorbide mononitrate (IMDUR) 30  MG 24 hr tablet TAKE ONE TABLET BY MOUTH ONCE DAILY 11/20/15  Yes Adline Potter, MD  levothyroxine (SYNTHROID, LEVOTHROID) 125 MCG tablet Take 1 tablet (125 mcg total) by mouth daily. D/c order for 131mcg  due to lab results- pt needs to take this dosing (112mcg) 09/22/15  Yes Juline Patch, MD  lisinopril (PRINIVIL,ZESTRIL) 2.5 MG tablet Take 2.5 mg by mouth daily.   Yes Historical Provider, MD  Melatonin 5 MG TABS Take 1 tablet by mouth at bedtime.   Yes Historical Provider, MD  metFORMIN (GLUCOPHAGE) 500 MG tablet Take 1 tablet (500 mg total) by mouth 2 (two) times daily with a meal. 09/16/15  Yes Adline Potter, MD  metoprolol succinate (TOPROL XL) 25 MG 24 hr tablet Take 1 tablet (25 mg total) by mouth daily. 11/12/15  Yes Renee Dyane Dustman, PA-C  Multiple Vitamins-Minerals (PRESERVISION AREDS 2 PO) Take 1 capsule by mouth 2 (two) times daily.    Yes Historical Provider, MD  nitroGLYCERIN (NITROSTAT) 0.4 MG SL tablet Place 0.4 mg under the tongue every 5 (five) minutes as needed for chest pain.   Yes Historical Provider, MD  pantoprazole (PROTONIX) 40 MG tablet Take 1 tablet (40 mg total) by mouth daily. 10/16/15  Yes Juline Patch, MD  potassium chloride (KLOR-CON) 8 MEQ tablet Take 1 tablet (8 mEq total) by mouth 2 (two) times daily. 09/16/15  Yes Adline Potter, MD  rosuvastatin (CRESTOR) 40 MG tablet Take 1 tablet (40 mg total) by mouth daily. 09/18/15  Yes Minna Merritts, MD  sitaGLIPtin (JANUVIA) 100 MG tablet Take 1 tablet (100 mg total) by mouth daily. 09/16/15  Yes Adline Potter, MD  vitamin B-12 (CYANOCOBALAMIN) 500 MCG tablet Take 500 mcg by mouth daily.   Yes Historical Provider, MD  warfarin (COUMADIN) 7.5 MG tablet Take 1 tablet (7.5 mg total) by mouth daily. 08/28/15  Yes Adline Potter, MD    Allergies  Allergen Reactions  . Demerol [Meperidine] Anaphylaxis    Tolerated Fentanyl 11/10/15  . Sulfa Antibiotics Anaphylaxis  . Tetracyclines & Related Other (See Comments)    Made nose, lips,  And tongue itchy    Past Surgical History:  Procedure Laterality Date  . CHOLECYSTECTOMY    . COLONOSCOPY  2015   1 polyp removed- repeat 3 years  . CORONARY ANGIOPLASTY WITH STENT PLACEMENT    .  EP IMPLANTABLE DEVICE N/A 11/10/2015   Procedure: Pacemaker Implant;  Surgeon: Will [name], MD;  Location: [facility];  Service: Cardiovascular;  Laterality: N/A;  . EP IMPLANTABLE DEVICE N/A 11/11/2015   Procedure: Lead Revision/Repair;  Surgeon: Evans Lance, MD;  Location: [facility];  Service: Cardiovascular;  Laterality: N/A;  . parathyroid surgery     x 2  . THYROID SURGERY    . TOTAL SHOULDER ARTHROPLASTY Right    x 2  . VAGINAL HYSTERECTOMY      Social History  Substance Use Topics  . Smoking status: Never Smoker  . Smokeless tobacco: Never Used  . Alcohol use 0.0 oz/week     Medication list has been reviewed and updated.   Physical Exam  Constitutional: She is oriented to person, place, and time. She appears well-developed. No distress.  HENT:  Head: Normocephalic and atraumatic.  Eyes: Left conjunctiva has a hemorrhage (from recent injection).  Neck: Carotid bruit is not present.  Thyroid not palpable. Scar from parathyroidectomy  Cardiovascular: Normal rate, regular rhythm and normal heart sounds.   Pulmonary/Chest: Effort  normal. No respiratory distress. She has decreased breath sounds.  Musculoskeletal: She exhibits no edema.  Neurological: She is alert and oriented to person, place, and time.  Skin: Skin is warm and dry. No rash noted.  Psychiatric: She has a normal mood and affect. Her speech is normal and behavior is normal. Thought content normal.  Nursing note and vitals reviewed.   BP 126/86   Pulse 74   Resp 16   Ht 5\' 3"  (1.6 m)   Wt 215 lb (97.5 kg)   SpO2 99%   BMI 38.09 kg/m   Assessment and Plan: 1. Hypothyroidism (acquired) Check panel - use T3 and T4 for monitoring Increase dose to 150 mcg - Thyroid Panel With TSH - levothyroxine (SYNTHROID, LEVOTHROID) 150 MCG tablet; Take 1 tablet (150 mcg total) by mouth daily. RFx1  2. Controlled type 2 diabetes mellitus with diabetic nephropathy, without long-term current  use of insulin ([facility]) Continue metformin and Januvia Once [place] runs out may be able to take metformin alone - Hemoglobin 123456 - Basic metabolic panel  3. Idiopathic gout, unspecified chronicity, unspecified site Controlled Recent UA 3.6  4. Chronic obstructive pulmonary disease, unspecified COPD type ([place]) Being worked by Dr. Raul Del  5. CKD (chronic kidney disease) stage 3, GFR 30-59 ml/min Monitor for change  6. Need for influenza vaccination - Flu Vaccine QUAD 36+ mos IM   Halina Maidens, MD [facility]  12/08/2015

## 2015-12-09 LAB — BASIC METABOLIC PANEL
BUN / CREAT RATIO: 13 (ref 12–28)
BUN: 17 mg/dL (ref 8–27)
CALCIUM: 9.3 mg/dL (ref 8.7–10.3)
CHLORIDE: 101 mmol/L (ref 96–106)
CO2: 27 mmol/L (ref 18–29)
CREATININE: 1.26 mg/dL — AB (ref 0.57–1.00)
GFR calc non Af Amer: 42 mL/min/{1.73_m2} — ABNORMAL LOW (ref >59)
GFR, EST AFRICAN AMERICAN: 48 mL/min/{1.73_m2} — AB (ref >59)
Glucose: 130 mg/dL — ABNORMAL HIGH (ref 65–99)
Potassium: 3.9 mmol/L (ref 3.5–5.2)
Sodium: 143 mmol/L (ref 134–144)

## 2015-12-09 LAB — HEMOGLOBIN A1C
ESTIMATED AVERAGE GLUCOSE: 177 mg/dL
Hgb A1c MFr Bld: 7.8 % — ABNORMAL HIGH (ref 4.8–5.6)

## 2015-12-09 LAB — THYROID PANEL WITH TSH
FREE THYROXINE INDEX: 3.4 (ref 1.2–4.9)
T3 Uptake Ratio: 30 % (ref 24–39)
T4, Total: 11.3 ug/dL (ref 4.5–12.0)
TSH: 0.05 u[IU]/mL — ABNORMAL LOW (ref 0.450–4.500)

## 2015-12-14 ENCOUNTER — Telehealth: Payer: Self-pay | Admitting: Cardiology

## 2015-12-14 NOTE — Telephone Encounter (Signed)
Spoke with patient.  She noticed that another stitch had worked its way to the surface last week at her healing PPM insertion site.  Patient denies fever, chills, or drainage from site.  Patient is agreeable to an appointment in the device clinic on 12/15/15 at 2:00pm.  She is appreciative of assistance and denies additional questions or concerns at this time.

## 2015-12-14 NOTE — Telephone Encounter (Signed)
New Message:    Pacemaker put in on 11-09-15. She had one stitch to come out,was told if any more came out to notify the office. She has another stitch coming out. Please call to advise.

## 2015-12-15 ENCOUNTER — Ambulatory Visit (INDEPENDENT_AMBULATORY_CARE_PROVIDER_SITE_OTHER): Payer: Medicare Other | Admitting: *Deleted

## 2015-12-15 DIAGNOSIS — Z950 Presence of cardiac pacemaker: Secondary | ICD-10-CM

## 2015-12-15 NOTE — Progress Notes (Signed)
Wound check with patient today s/p c/o stitch reaching surface of incision. Stitch clipped, antibiotic ointment and steri-strips applied. Will schedule wound re-check on 10/9. Incision free of redness, swelling or drainage. Patient denies fever or chills.

## 2015-12-21 ENCOUNTER — Telehealth: Payer: Self-pay

## 2015-12-21 ENCOUNTER — Ambulatory Visit (INDEPENDENT_AMBULATORY_CARE_PROVIDER_SITE_OTHER): Payer: Medicare Other | Admitting: *Deleted

## 2015-12-21 DIAGNOSIS — Z950 Presence of cardiac pacemaker: Secondary | ICD-10-CM

## 2015-12-21 MED ORDER — ALLOPURINOL 100 MG PO TABS: 100.0000 mg | ORAL_TABLET | Freq: Every day | ORAL | 2 refills | Status: DC

## 2015-12-21 MED ORDER — POTASSIUM CHLORIDE ER 8 MEQ PO TBCR: 8.0000 meq | EXTENDED_RELEASE_TABLET | Freq: Two times a day (BID) | ORAL | 2 refills | Status: DC

## 2015-12-21 NOTE — Telephone Encounter (Signed)
Called in refills to [facility] potassium allopurinol

## 2015-12-21 NOTE — Progress Notes (Signed)
Stitch removed from PPM site, site well healed edges well approximated, no redness or edema. F/U as scheduled with Dr. Curt Bears.

## 2015-12-23 ENCOUNTER — Ambulatory Visit (INDEPENDENT_AMBULATORY_CARE_PROVIDER_SITE_OTHER): Payer: Medicare Other

## 2015-12-23 ENCOUNTER — Ambulatory Visit
Admission: RE | Admit: 2015-12-23 | Discharge: 2015-12-23 | Disposition: A | Discharge status: Home / Self Care | Payer: Medicare Other | Source: Ambulatory Visit | Attending: Specialist | Admitting: Specialist

## 2015-12-23 DIAGNOSIS — I700 Atherosclerosis of aorta: Secondary | ICD-10-CM | POA: Diagnosis not present

## 2015-12-23 DIAGNOSIS — Z7901 Long term (current) use of anticoagulants: Secondary | ICD-10-CM | POA: Diagnosis not present

## 2015-12-23 DIAGNOSIS — J984 Other disorders of lung: Secondary | ICD-10-CM | POA: Insufficient documentation

## 2015-12-23 DIAGNOSIS — J8410 Pulmonary fibrosis, unspecified: Secondary | ICD-10-CM | POA: Insufficient documentation

## 2015-12-23 DIAGNOSIS — R918 Other nonspecific abnormal finding of lung field: Secondary | ICD-10-CM | POA: Diagnosis not present

## 2015-12-23 DIAGNOSIS — I2510 Atherosclerotic heart disease of native coronary artery without angina pectoris: Secondary | ICD-10-CM | POA: Diagnosis not present

## 2015-12-23 DIAGNOSIS — R05 Cough: Secondary | ICD-10-CM | POA: Insufficient documentation

## 2015-12-23 DIAGNOSIS — I4891 Unspecified atrial fibrillation: Secondary | ICD-10-CM | POA: Diagnosis not present

## 2015-12-23 DIAGNOSIS — R059 Cough, unspecified: Secondary | ICD-10-CM

## 2015-12-23 DIAGNOSIS — I480 Paroxysmal atrial fibrillation: Secondary | ICD-10-CM | POA: Diagnosis not present

## 2015-12-23 DIAGNOSIS — R0602 Shortness of breath: Secondary | ICD-10-CM | POA: Insufficient documentation

## 2015-12-23 HISTORY — DX: Heart failure, unspecified: I50.9

## 2015-12-23 LAB — POCT INR: INR: 2.9

## 2015-12-23 MED ORDER — IOPAMIDOL (ISOVUE-300) INJECTION 61%
75.0000 mL | Freq: Once | INTRAVENOUS | Status: AC | PRN
  Administered 2015-12-23: 60 mL via INTRAVENOUS

## 2015-12-31 ENCOUNTER — Telehealth: Payer: Self-pay | Admitting: Cardiovascular Disease

## 2015-12-31 DIAGNOSIS — R0602 Shortness of breath: Secondary | ICD-10-CM | POA: Diagnosis not present

## 2015-12-31 DIAGNOSIS — E6609 Other obesity due to excess calories: Secondary | ICD-10-CM | POA: Diagnosis not present

## 2015-12-31 DIAGNOSIS — J310 Chronic rhinitis: Secondary | ICD-10-CM | POA: Diagnosis not present

## 2015-12-31 DIAGNOSIS — J449 Chronic obstructive pulmonary disease, unspecified: Secondary | ICD-10-CM | POA: Diagnosis not present

## 2015-12-31 NOTE — Telephone Encounter (Signed)
Spoke w/ pt.  She denies active chest pain. She reports increased SOBOE, but is unsure if this is r/t her COPD. She denies chest tightness, does report that it "feels like a lump in my chest". Reports sx have been worsening for the past 2 weeks.  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 She can hear air going in, but not coming out.  Offered her appt to see Christell Faith, PA tomorrow @ 2:30, but she declines. Offered her appt w/ Dr. Rockey Situ on 11/3, but she declines. She will be due for 6 mo f/u in January, she would like to wait and make an appt then. Offered to make Dr. Rockey Situ aware of her concerns and call her back w/ his recommendation, but she declines, as she "is fine" and will f/u as scheduled.

## 2015-12-31 NOTE — Telephone Encounter (Signed)
Pt c/o Shortness Of Breath: STAT if SOB developed within the last 24 hours or pt is noticeably SOB on the phone  1. Are you currently SOB (can you hear that pt is SOB on the phone)?Yes and yes  2. How long have you been experiencing SOB? Couple of weeks  3. Are you SOB when sitting or when up moving around?  Moving   4. Are you currently experiencing any other symptoms?  Heavy chest feeling and pain when moving around.  Please call patient. Patient does have a new pacemaker.

## 2016-01-05 ENCOUNTER — Telehealth: Payer: Self-pay | Admitting: Cardiology

## 2016-01-05 NOTE — Telephone Encounter (Signed)
Pt calls reporting continuous ShOB and a heavy "lump" in the middle of her chest that presents as a dull ache. These symptoms have worsened of the past 2-4 wks. Acute 5lb weight gain abdominally that she states feels like fluid accumulation. Pt had remote transmission 10/22 that shows increased ventricular thresholds and an increase in AT/AF burden over the last 4wks. Reviewed with GT--recommends OV with WC. Scheduled on 10/25 at 4:30pm.

## 2016-01-05 NOTE — Telephone Encounter (Signed)
Routing to device clinic to address w/ patient and if PPM adjustment needs to be made.

## 2016-01-05 NOTE — Telephone Encounter (Signed)
New Message:   Pt says she have been to her Pulmonary doctor for the shortness of breath. He says some of it maybe cardiac related.She have swelling in her midriff area,which is making it harder to breathe.Her Pulmonary doctor says her pacemaker may needs to be adjusted.

## 2016-01-06 ENCOUNTER — Encounter: Payer: Medicare Other | Admitting: Cardiology

## 2016-01-06 NOTE — Telephone Encounter (Signed)
lmtcb to reschedule pt to tomorrow, per Dr. Curt Bears

## 2016-01-06 NOTE — Telephone Encounter (Signed)
Rescheduled to tomorrow w/ [place].  Patient is agreeable to new date/time.

## 2016-01-06 NOTE — Telephone Encounter (Signed)
Follow up  ° ° °Patient returning call back to nurse  °

## 2016-01-07 ENCOUNTER — Ambulatory Visit (INDEPENDENT_AMBULATORY_CARE_PROVIDER_SITE_OTHER): Payer: Medicare Other | Admitting: Cardiology

## 2016-01-07 ENCOUNTER — Encounter: Payer: Self-pay | Admitting: Cardiology

## 2016-01-07 VITALS — BP 142/72 | HR 68 | Ht 63.0 in | Wt 216.6 lb

## 2016-01-07 DIAGNOSIS — R079 Chest pain, unspecified: Secondary | ICD-10-CM

## 2016-01-07 DIAGNOSIS — I25708 Atherosclerosis of coronary artery bypass graft(s), unspecified, with other forms of angina pectoris: Secondary | ICD-10-CM

## 2016-01-07 DIAGNOSIS — R0602 Shortness of breath: Secondary | ICD-10-CM

## 2016-01-07 NOTE — Patient Instructions (Addendum)
Medication Instructions:    Your physician recommends that you continue on your current medications as directed. Please refer to the Current Medication list given to you today.  --- If you need a refill on your cardiac medications before your next appointment, please call your pharmacy. ---  Labwork:  None ordered  Testing/Procedures: Your physician has requested that you have a lexiscan myoview. For further information please visit [email or web address]. Please follow instruction sheet, as given.  Follow-Up:  Keep currently scheduled follow up appointment on 02/11/16 with Dr. Curt Bears   Thank you for choosing [facility]!!   Trinidad Curet, RN [phone]   Any Other Special Instructions Will Be Listed Below (If Applicable).  Pharmacologic Stress Electrocardiogram A pharmacologic stress electrocardiogram is a heart (cardiac) test that uses nuclear imaging to evaluate the blood supply to your heart. This test may also be called a pharmacologic stress electrocardiography. Pharmacologic means that a medicine is used to increase your heart rate and blood pressure.  This stress test is done to find areas of poor blood flow to the heart by determining the extent of coronary artery disease (CAD). Some people exercise on a treadmill, which naturally increases the blood flow to the heart. For those people unable to exercise on a treadmill, a medicine is used. This medicine stimulates your heart and will cause your heart to beat harder and more quickly, as if you were exercising.  Pharmacologic stress tests can help determine:  The adequacy of blood flow to your heart during increased levels of activity in order to clear you for discharge home.  The extent of coronary artery blockage caused by CAD.  Your prognosis if you have suffered a heart attack.  The effectiveness of cardiac procedures done, such as an angioplasty, which can increase the circulation in your coronary  arteries.  Causes of chest pain or pressure. LET [facility] CARE PROVIDER KNOW ABOUT:  Any allergies you have.  All medicines you are taking, including vitamins, herbs, eye drops, creams, and over-the-counter medicines.  Previous problems you or members of your family have had with the use of anesthetics.  Any blood disorders you have.  Previous surgeries you have had.  Medical conditions you have.  Possibility of pregnancy, if this applies.  If you are currently breastfeeding. RISKS AND COMPLICATIONS Generally, this is a safe procedure. However, as with any procedure, complications can occur. Possible complications include:  You develop pain or pressure in the following areas:  Chest.  Jaw or neck.  Between your shoulder blades.  Radiating down your left arm.  Headache.  Dizziness or light-headedness.  Shortness of breath.  Increased or irregular heartbeat.  Low blood pressure.  Nausea or vomiting.  Flushing.  Redness going up the arm and slight pain during injection of medicine.  Heart attack (rare). BEFORE THE PROCEDURE   Avoid all forms of caffeine for 24 hours before your test or as directed by your health care provider. This includes coffee, tea (even decaffeinated tea), caffeinated sodas, chocolate, cocoa, and certain pain medicines.  Follow your health care provider's instructions regarding eating and drinking before the test.  Take your medicines as directed at regular times with water unless instructed otherwise. Exceptions may include:  If you have diabetes, ask how you are to take your insulin or pills. It is common to adjust insulin dosing the morning of the test.  If you are taking beta-blocker medicines, it is important to talk to your health care provider about these medicines well before  the date of your test. Taking beta-blocker medicines may interfere with the test. In some cases, these medicines need to be changed or stopped 24 hours or  more before the test.  If you wear a nitroglycerin patch, it may need to be removed prior to the test. Ask your health care provider if the patch should be removed before the test.  If you use an inhaler for any breathing condition, bring it with you to the test.  If you are an outpatient, bring a snack so you can eat right after the stress phase of the test.  Do not smoke for 4 hours prior to the test or as directed by your health care provider.  Do not apply lotions, powders, creams, or oils on your chest prior to the test.  Wear comfortable shoes and clothing. Let your health care provider know if you were unable to complete or follow the preparations for your test. PROCEDURE   Multiple patches (electrodes) will be put on your chest. If needed, small areas of your chest may be shaved to get better contact with the electrodes. Once the electrodes are attached to your body, multiple wires will be attached to the electrodes, and your heart rate will be monitored.  An IV access will be started. A nuclear trace (isotope) is given. The isotope may be given intravenously, or it may be swallowed. Nuclear refers to several types of radioactive isotopes, and the nuclear isotope lights up the arteries so that the nuclear images are clear. The isotope is absorbed by your body. This results in low radiation exposure.  A resting nuclear image is taken to show how your heart functions at rest.  A medicine is given through the IV access.  A second scan is done about 1 hour after the medicine injection and determines how your heart functions under stress.  During this stress phase, you will be connected to an electrocardiogram machine. Your blood pressure and oxygen levels will be monitored. AFTER THE PROCEDURE   Your heart rate and blood pressure will be monitored after the test.  You may return to your normal schedule, including diet,activities, and medicines, unless your health care provider  tells you otherwise.   This information is not intended to replace advice given to you by your health care provider. Make sure you discuss any questions you have with your health care provider.   Document Released: 07/17/2008 Document Revised: 03/05/2013 Document Reviewed: 11/05/2012 Elsevier Interactive Patient Education [employer].

## 2016-01-07 NOTE — Progress Notes (Signed)
Electrophysiology Office Note   Date:  01/07/2016   ID:  Dawn Garcia, DOB August 14, 1940, MRN [id number]  PCP:  Halina Maidens, MD  Cardiologist:  Rockey Situ Primary Electrophysiologist:  Jentzen Minasyan [name], MD    Chief Complaint  Patient presents with  . Pacemaker Check    increased SOB/chest pain     History of Present Illness: Dawn Garcia is a 75 y.o. female who presents today for electrophysiology evaluation.   PMHx of CAD (remote stent, patent by cath 2014), PAFib reportedly with an ablation in 2013, COPD, DM, HLD, hypothyroidism, reported history of bradycardia. Was noted to have 5 second pauses on a monitor and a dual chamber pacemaker was placed 11/09/15. Had RA lead revision due to dislodgement.    Today, she denies symptoms of palpitations, orthopnea, PND, lower extremity edema, claudication, dizziness, presyncope, syncope, bleeding, or neurologic sequela. The patient is tolerating medications without difficulties. She says that she has been short of breath for the last few months. Her pulmonologist feels like it could be due to her COPD with a portion is due to cardiac causes. She is also had a mild cough and he has taken her off of her lisinopril. She gets short of breath when lying flat, but this is a used by wearing her CPAP. She says that her shortness of breath is worse when she exerts herself and better with rest. She has a stationary bike and she has had 2 either not righted or write it much more slowly than in the past. She also gets occasional chest pain in the center for chest that radiates under her breasts bilaterally. She also says that she feels like she has gained a few pounds of fluid weight   Past Medical History:  Diagnosis Date  . Atrial fibrillation ([place])   . CAD (coronary artery disease)   . Cancer ([place])    skin  . CHF (congestive heart failure) ([facility])     A-Fib  . COPD (chronic obstructive pulmonary disease) ([place])   . Diabetes mellitus without  complication ([place])   . GERD (gastroesophageal reflux disease)   . Hyperlipidemia   . Hypocalcemia   . Hypokalemia   . Macular degeneration   . 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 Sinus node dysfunction ([facility])   . Thyroid disease   . Vitamin B12 deficiency   . Vitamin D deficiency    Past Surgical History:  Procedure Laterality Date  . CHOLECYSTECTOMY    . COLONOSCOPY  2015   1 polyp removed- repeat 3 years  . CORONARY ANGIOPLASTY WITH STENT PLACEMENT    . EP IMPLANTABLE DEVICE N/A 11/10/2015   Procedure: Pacemaker Implant;  Surgeon: Jossie Smoot [name], MD;  Location: [facility];  Service: Cardiovascular;  Laterality: N/A;  . EP IMPLANTABLE DEVICE N/A 11/11/2015   Procedure: Lead Revision/Repair;  Surgeon: Evans Lance, MD;  Location: [facility];  Service: Cardiovascular;  Laterality: N/A;  . parathyroid surgery     x 2  . THYROID SURGERY    . TOTAL SHOULDER ARTHROPLASTY Right    x 2  . VAGINAL HYSTERECTOMY       Current Outpatient Prescriptions  Medication Sig Dispense Refill  . albuterol (PROVENTIL HFA;VENTOLIN HFA) 108 (90 Base) MCG/ACT inhaler Inhale 2 puffs into the lungs every 6 (six) hours as needed for wheezing or shortness of breath.    . allopurinol (ZYLOPRIM) 100 MG tablet Take 1 tablet (100 mg total) by mouth daily. 90 tablet  2  . ARTIFICIAL TEAR OP Place 1 drop into both eyes as needed (for dry eyes).    . Cholecalciferol (VITAMIN D3) 5000 units TABS Take 1 tablet by mouth every morning.    . furosemide (LASIX) 20 MG tablet Take 40mg  by mouth every other day alternating with 60mg  by mouth every other day. 225 tablet 1  . isosorbide mononitrate (IMDUR) 30 MG 24 hr tablet TAKE ONE TABLET BY MOUTH ONCE DAILY 30 tablet 3  . levothyroxine (SYNTHROID, LEVOTHROID) 150 MCG tablet Take 1 tablet (150 mcg total) by mouth daily. 90 tablet 1  . lisinopril (PRINIVIL,ZESTRIL) 2.5 MG tablet Take 2.5 mg by mouth daily.    . Melatonin 5 MG TABS Take 1  tablet by mouth at bedtime.    . metFORMIN (GLUCOPHAGE) 500 MG tablet Take 1 tablet (500 mg total) by mouth 2 (two) times daily with a meal. 180 tablet 1  . metoprolol succinate (TOPROL XL) 25 MG 24 hr tablet Take 1 tablet (25 mg total) by mouth daily. 30 tablet 4  . Multiple Vitamins-Minerals (PRESERVISION AREDS 2 PO) Take 1 capsule by mouth 2 (two) times daily.     . nitroGLYCERIN (NITROSTAT) 0.4 MG SL tablet Place 0.4 mg under the tongue every 5 (five) minutes as needed for chest pain.    . pantoprazole (PROTONIX) 40 MG tablet Take 1 tablet (40 mg total) by mouth daily. 90 tablet 1  . potassium chloride (KLOR-CON) 8 MEQ tablet Take 1 tablet (8 mEq total) by mouth 2 (two) times daily. 180 tablet 2  . rosuvastatin (CRESTOR) 40 MG tablet Take 1 tablet (40 mg total) by mouth daily. 30 tablet 11  . sitaGLIPtin (JANUVIA) 100 MG tablet Take 1 tablet (100 mg total) by mouth daily. 90 tablet 1  . vitamin B-12 (CYANOCOBALAMIN) 500 MCG tablet Take 500 mcg by mouth daily.    Marland Kitchen warfarin (COUMADIN) 7.5 MG tablet Take 1 tablet (7.5 mg total) by mouth daily. 90 tablet 3   No current facility-administered medications for this visit.     Allergies:   Demerol [meperidine]; Sulfa antibiotics; and Tetracyclines & related   Social History:  The patient  reports that she has never smoked. She has never used smokeless tobacco. She reports that she drinks alcohol. She reports that she does not use drugs.   Family History:  The patient's family history includes Heart defect (age of onset: 9) in her father; Thyroid disease (age of onset: 25) in her mother.    ROS:  Please see the history of present illness.   Otherwise, review of systems is positive for A weight gain, fatigue, chest pain, shortness of breath, wheezing, diarrhea.   All other systems are reviewed and negative.    PHYSICAL EXAM: VS:  BP (!) 142/72   Pulse 68   Ht 5\' 3"  (1.6 m)   Wt 216 lb 9.6 oz (98.2 kg)   BMI 38.37 kg/m  , BMI Body mass index  is 38.37 kg/m. GEN: Well nourished, well developed, in no acute distress  HEENT: normal  Neck: no JVD, carotid bruits, or masses Cardiac: RRR; no murmurs, rubs, or gallops,no edema  Respiratory:  clear to auscultation bilaterally, normal work of breathing GI: soft, nontender, nondistended, + BS MS: no deformity or atrophy  Skin: warm and dry,  device pocket is well healed Neuro:  Strength and sensation are intact Psych: euthymic mood, full affect  EKG:  EKG is ordered today. Personal review of the ekg ordered shows sinus  rhythm with V pacing as well as sinus rhythm with intact conduction. Inferior and lateral TWI.   Device interrogation is reviewed today in detail.  See PaceArt for details.   Recent Labs: 08/19/2015: ALT 21 11/12/2015: Hemoglobin 12.7; Platelets 154 12/08/2015: BUN 17; Creatinine, Ser 1.26; Potassium 3.9; Sodium 143; TSH 0.050    Lipid Panel     Component Value Date/Time   CHOL 200 (H) 09/09/2015 1600   TRIG 418 (H) 09/09/2015 1600   HDL 44 09/09/2015 1600   CHOLHDL 4.5 (H) 09/09/2015 1600   LDLCALC Comment 09/09/2015 1600     Wt Readings from Last 3 Encounters:  01/07/16 216 lb 9.6 oz (98.2 kg)  12/08/15 215 lb (97.5 kg)  11/12/15 207 lb 14.4 oz (94.3 kg)      Other studies Reviewed: Additional studies/ records that were reviewed today include: TTE 09/02/15  Review of the above records today demonstrates:  - Left ventricle: The cavity size was normal. Posterior wall   thickness was increased.. Systolic function was normal. The   estimated ejection fraction was in the range of 60% to 65%. Wall   motion was normal; there were no regional wall motion   abnormalities. Doppler parameters are consistent with abnormal   left ventricular relaxation (grade 1 diastolic dysfunction). - Aortic valve: There was trivial regurgitation. - Left atrium: The atrium was mildly dilated.   ASSESSMENT AND PLAN:  1.  Sick sinus syndrome: s/p dual chamber pacemaker.  Pacemaker functioning appropriately.  2. SOB: Is unclear as to the absolute cause of her shortness of breath. She has had an echo in the past that showed a normal ejection fraction. That being said, she does have shortness of breath with exertion and some mild chest pain. We'll order a Myoview to determine both if she has evidence of coronary disease and see if she has had any change in her ejection fraction.  3. Sleep apnea: Compliant with CPAP    Current medicines are reviewed at length with the patient today.   The patient does not have concerns regarding her medicines.  The following changes were made today:  none  Labs/ tests ordered today include:  Orders Placed This Encounter  Procedures  . Myocardial Perfusion Imaging  . EKG 12-Lead     Disposition:   FU with Keri Tavella 1 months  Signed, Sonny Poth [name], MD  01/07/2016 12:16 PM     [place] [address] [phone] (office) [phone] (fax)

## 2016-01-11 DIAGNOSIS — Z1283 Encounter for screening for malignant neoplasm of skin: Secondary | ICD-10-CM | POA: Diagnosis not present

## 2016-01-11 DIAGNOSIS — Z85820 Personal history of malignant melanoma of skin: Secondary | ICD-10-CM | POA: Diagnosis not present

## 2016-01-11 DIAGNOSIS — Z08 Encounter for follow-up examination after completed treatment for malignant neoplasm: Secondary | ICD-10-CM | POA: Diagnosis not present

## 2016-01-11 DIAGNOSIS — D485 Neoplasm of uncertain behavior of skin: Secondary | ICD-10-CM | POA: Diagnosis not present

## 2016-01-11 DIAGNOSIS — L821 Other seborrheic keratosis: Secondary | ICD-10-CM | POA: Diagnosis not present

## 2016-01-11 DIAGNOSIS — L918 Other hypertrophic disorders of the skin: Secondary | ICD-10-CM | POA: Diagnosis not present

## 2016-01-11 DIAGNOSIS — D225 Melanocytic nevi of trunk: Secondary | ICD-10-CM | POA: Diagnosis not present

## 2016-01-18 ENCOUNTER — Telehealth: Payer: Self-pay

## 2016-01-18 DIAGNOSIS — H353221 Exudative age-related macular degeneration, left eye, with active choroidal neovascularization: Secondary | ICD-10-CM | POA: Diagnosis not present

## 2016-01-18 DIAGNOSIS — H43813 Vitreous degeneration, bilateral: Secondary | ICD-10-CM | POA: Diagnosis not present

## 2016-01-18 DIAGNOSIS — H353112 Nonexudative age-related macular degeneration, right eye, intermediate dry stage: Secondary | ICD-10-CM | POA: Diagnosis not present

## 2016-01-18 DIAGNOSIS — H35033 Hypertensive retinopathy, bilateral: Secondary | ICD-10-CM | POA: Diagnosis not present

## 2016-01-18 NOTE — Telephone Encounter (Signed)
Needs RX for Warfarin 7.5 mg daily sent to [facility].

## 2016-01-18 NOTE — Telephone Encounter (Signed)
I am not managing her warfarin so I will not prescribe.  Please get this from Cardiology.

## 2016-01-19 ENCOUNTER — Telehealth: Payer: Self-pay | Admitting: Cardiology

## 2016-01-19 NOTE — Telephone Encounter (Signed)
lmtcb

## 2016-01-19 NOTE — Telephone Encounter (Signed)
Mrs. Kille is calling to find out what medications she will need to stop before her stress test , please call

## 2016-01-20 ENCOUNTER — Telehealth (HOSPITAL_COMMUNITY): Payer: Self-pay | Admitting: *Deleted

## 2016-01-20 ENCOUNTER — Ambulatory Visit (INDEPENDENT_AMBULATORY_CARE_PROVIDER_SITE_OTHER): Payer: Medicare Other

## 2016-01-20 DIAGNOSIS — Z7901 Long term (current) use of anticoagulants: Secondary | ICD-10-CM | POA: Diagnosis not present

## 2016-01-20 DIAGNOSIS — I480 Paroxysmal atrial fibrillation: Secondary | ICD-10-CM

## 2016-01-20 DIAGNOSIS — I4891 Unspecified atrial fibrillation: Secondary | ICD-10-CM | POA: Diagnosis not present

## 2016-01-20 LAB — POCT INR: INR: 3

## 2016-01-20 MED ORDER — WARFARIN SODIUM 7.5 MG PO TABS: 7.5000 mg | ORAL_TABLET | Freq: Every day | ORAL | 1 refills | Status: DC

## 2016-01-20 NOTE — Telephone Encounter (Signed)
Patient given detailed instructions per Myocardial Perfusion Study Information Sheet for the test on 01/22/16 at 0930. Patient notified to arrive 15 minutes early and that it is imperative to arrive on time for appointment to keep from having the test rescheduled.  If you need to cancel or reschedule your appointment, please call the office within 24 hours of your appointment. Failure to do so may result in a cancellation of your appointment, and a $50 no show fee. Patient verbalized understanding.Berta Denson, Ranae Palms

## 2016-01-22 ENCOUNTER — Ambulatory Visit (HOSPITAL_COMMUNITY): Payer: Medicare Other | Attending: Cardiovascular Disease

## 2016-01-22 DIAGNOSIS — R079 Chest pain, unspecified: Secondary | ICD-10-CM

## 2016-01-22 DIAGNOSIS — R9439 Abnormal result of other cardiovascular function study: Secondary | ICD-10-CM | POA: Insufficient documentation

## 2016-01-22 DIAGNOSIS — R0602 Shortness of breath: Secondary | ICD-10-CM

## 2016-01-22 DIAGNOSIS — I252 Old myocardial infarction: Secondary | ICD-10-CM | POA: Insufficient documentation

## 2016-01-22 LAB — MYOCARDIAL PERFUSION IMAGING
LV dias vol: 123 mL (ref 46–106)
LV sys vol: 57 mL
Peak HR: 114 {beats}/min
RATE: 0.29
Rest HR: 60 {beats}/min
SDS: 0
SRS: 6
SSS: 6
TID: 1.07

## 2016-01-22 MED ORDER — TECHNETIUM TC 99M TETROFOSMIN IV KIT
33.0000 | PACK | Freq: Once | INTRAVENOUS | Status: AC | PRN
  Administered 2016-01-22: 33 via INTRAVENOUS
  Filled 2016-01-22: qty 33

## 2016-01-22 MED ORDER — REGADENOSON 0.4 MG/5ML IV SOLN
0.4000 mg | Freq: Once | INTRAVENOUS | Status: AC
  Administered 2016-01-22: 0.4 mg via INTRAVENOUS

## 2016-01-22 MED ORDER — TECHNETIUM TC 99M TETROFOSMIN IV KIT
10.9000 | PACK | Freq: Once | INTRAVENOUS | Status: AC | PRN
  Administered 2016-01-22: 10.9 via INTRAVENOUS
  Filled 2016-01-22: qty 11

## 2016-02-11 ENCOUNTER — Encounter: Payer: Self-pay | Admitting: Cardiology

## 2016-02-11 ENCOUNTER — Ambulatory Visit (INDEPENDENT_AMBULATORY_CARE_PROVIDER_SITE_OTHER): Payer: Medicare Other | Admitting: Cardiology

## 2016-02-11 VITALS — BP 126/68 | HR 60 | Ht 63.0 in | Wt 216.6 lb

## 2016-02-11 DIAGNOSIS — I480 Paroxysmal atrial fibrillation: Secondary | ICD-10-CM | POA: Diagnosis not present

## 2016-02-11 DIAGNOSIS — Z950 Presence of cardiac pacemaker: Secondary | ICD-10-CM

## 2016-02-11 DIAGNOSIS — I25708 Atherosclerosis of coronary artery bypass graft(s), unspecified, with other forms of angina pectoris: Secondary | ICD-10-CM

## 2016-02-11 DIAGNOSIS — I495 Sick sinus syndrome: Secondary | ICD-10-CM | POA: Diagnosis not present

## 2016-02-11 LAB — CUP PACEART INCLINIC DEVICE CHECK
Brady Statistic RA Percent Paced: 54 %
Brady Statistic RV Percent Paced: 87 %
Implantable Lead Implant Date: 20170829
Implantable Lead Location: 753860
Lead Channel Impedance Value: 475 Ohm
Lead Channel Impedance Value: 625 Ohm
Lead Channel Pacing Threshold Amplitude: 1.125 V
Lead Channel Pacing Threshold Pulse Width: 0.4 ms
Lead Channel Sensing Intrinsic Amplitude: 12 mV
Lead Channel Setting Pacing Amplitude: 2.125
Lead Channel Setting Pacing Amplitude: 2.5 V
Lead Channel Setting Sensing Sensitivity: 2 mV
MDC IDC LEAD IMPLANT DT: 20170829
MDC IDC LEAD LOCATION: 753859
MDC IDC MSMT BATTERY VOLTAGE: 3.01 V
MDC IDC MSMT LEADCHNL RA PACING THRESHOLD PULSEWIDTH: 0.4 ms
MDC IDC MSMT LEADCHNL RV PACING THRESHOLD AMPLITUDE: 0.75 V
MDC IDC PG IMPLANT DT: 20170829
MDC IDC PG SERIAL: 7945290
MDC IDC SESS DTM: 20171130160509
MDC IDC SET LEADCHNL RV PACING PULSEWIDTH: 0.4 ms

## 2016-02-11 MED ORDER — DRONEDARONE HCL 400 MG PO TABS: 400.0000 mg | ORAL_TABLET | Freq: Two times a day (BID) | ORAL | 6 refills | Status: DC

## 2016-02-11 MED ORDER — ISOSORBIDE MONONITRATE ER 30 MG PO TB24: 30.0000 mg | ORAL_TABLET | Freq: Every day | ORAL | 3 refills | Status: DC

## 2016-02-11 MED ORDER — METOPROLOL SUCCINATE ER 25 MG PO TB24: 25.0000 mg | ORAL_TABLET | Freq: Every day | ORAL | 3 refills | Status: DC

## 2016-02-11 NOTE — Progress Notes (Signed)
Electrophysiology Office Note   Date:  02/11/2016   ID:  Dawn Garcia, DOB September 25, 1940, MRN [id number]  PCP:  Halina Maidens, MD  Cardiologist:  Rockey Situ Primary Electrophysiologist:  Lukisha Procida [name], MD    Chief Complaint  Patient presents with  . Pacemaker Check    Symptomatic bradycardia     History of Present Illness: Dawn Garcia is a 75 y.o. female who presents today for electrophysiology evaluation.   PMHx of CAD (remote stent, patent by cath 2014), PAFib reportedly with an ablation in 2013, COPD, DM, HLD, hypothyroidism, reported history of bradycardia. Was noted to have 5 second pauses on a monitor and a dual chamber pacemaker was placed 11/09/15. Had RA lead revision due to dislodgement.    Today, she denies symptoms of palpitations, orthopnea, PND, lower extremity edema, claudication, dizziness, presyncope, syncope, bleeding, or neurologic sequela. The patient is tolerating medications without difficulties. She continues to be short of breath. She says that when she climbs stairs or walks long distances that she gets short of breath with exertion mainly. She also has occasional chest discomfort when she exerts herself. She says that she feels like she has been gaining and losing up to 6 pounds in a week. She feels that it is due to fluid.   Past Medical History:  Diagnosis Date  . Atrial fibrillation ([place])   . CAD (coronary artery disease)   . Cancer ([place])    skin  . CHF (congestive heart failure) (H[facility])     A-Fib  . COPD (chronic obstructive pulmonary disease) ([place])   . Diabetes mellitus without complication ([place])   . GERD (gastroesophageal reflux disease)   . Hyperlipidemia   . Hypocalcemia   . Hypokalemia   . Macular degeneration   . 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 Sinus node dysfunction (H[facility])   . Thyroid disease   . Vitamin B12 deficiency   . Vitamin D deficiency    Past Surgical History:  Procedure Laterality Date  .  CHOLECYSTECTOMY    . COLONOSCOPY  2015   1 polyp removed- repeat 3 years  . CORONARY ANGIOPLASTY WITH STENT PLACEMENT    . EP IMPLANTABLE DEVICE N/A 11/10/2015   Procedure: Pacemaker Implant;  Surgeon: Marty Sadlowski [name], MD;  Location: [facility];  Service: Cardiovascular;  Laterality: N/A;  . EP IMPLANTABLE DEVICE N/A 11/11/2015   Procedure: Lead Revision/Repair;  Surgeon: Evans Lance, MD;  Location: [facility];  Service: Cardiovascular;  Laterality: N/A;  . parathyroid surgery     x 2  . THYROID SURGERY    . TOTAL SHOULDER ARTHROPLASTY Right    x 2  . VAGINAL HYSTERECTOMY       Current Outpatient Prescriptions  Medication Sig Dispense Refill  . albuterol (PROVENTIL HFA;VENTOLIN HFA) 108 (90 Base) MCG/ACT inhaler Inhale 2 puffs into the lungs every 6 (six) hours as needed for wheezing or shortness of breath.    . allopurinol (ZYLOPRIM) 100 MG tablet Take 1 tablet (100 mg total) by mouth daily. 90 tablet 2  . ARTIFICIAL TEAR OP Place 1 drop into both eyes as needed (for dry eyes).    . Cholecalciferol (VITAMIN D3) 5000 units TABS Take 1 tablet by mouth every morning.    . furosemide (LASIX) 20 MG tablet Take 40mg  by mouth every other day alternating with 60mg  by mouth every other day. 225 tablet 1  . isosorbide mononitrate (IMDUR) 30 MG 24 hr tablet TAKE ONE TABLET BY MOUTH  ONCE DAILY 30 tablet 3  . levothyroxine (SYNTHROID, LEVOTHROID) 150 MCG tablet Take 1 tablet (150 mcg total) by mouth daily. 90 tablet 1  . lisinopril (PRINIVIL,ZESTRIL) 2.5 MG tablet Take 2.5 mg by mouth daily.    . Melatonin 5 MG TABS Take 1 tablet by mouth at bedtime.    . metFORMIN (GLUCOPHAGE) 500 MG tablet Take 1 tablet (500 mg total) by mouth 2 (two) times daily with a meal. 180 tablet 1  . metoprolol succinate (TOPROL XL) 25 MG 24 hr tablet Take 1 tablet (25 mg total) by mouth daily. 30 tablet 4  . Multiple Vitamins-Minerals (PRESERVISION AREDS 2 PO) Take 1 capsule by mouth 2 (two) times  daily.     . nitroGLYCERIN (NITROSTAT) 0.4 MG SL tablet Place 0.4 mg under the tongue every 5 (five) minutes as needed for chest pain.    . pantoprazole (PROTONIX) 40 MG tablet Take 1 tablet (40 mg total) by mouth daily. 90 tablet 1  . potassium chloride (KLOR-CON) 8 MEQ tablet Take 1 tablet (8 mEq total) by mouth 2 (two) times daily. 180 tablet 2  . rosuvastatin (CRESTOR) 40 MG tablet Take 1 tablet (40 mg total) by mouth daily. 30 tablet 11  . sitaGLIPtin (JANUVIA) 100 MG tablet Take 1 tablet (100 mg total) by mouth daily. 90 tablet 1  . vitamin B-12 (CYANOCOBALAMIN) 500 MCG tablet Take 500 mcg by mouth daily.    Marland Kitchen warfarin (COUMADIN) 7.5 MG tablet Take 1 tablet (7.5 mg total) by mouth daily. 100 tablet 1   No current facility-administered medications for this visit.     Allergies:   Demerol [meperidine]; Sulfa antibiotics; and Tetracyclines & related   Social History:  The patient  reports that she has never smoked. She has never used smokeless tobacco. She reports that she drinks alcohol. She reports that she does not use drugs.   Family History:  The patient's family history includes Heart defect (age of onset: 12) in her father; Thyroid disease (age of onset: 16) in her mother.    ROS:  Please see the history of present illness.   Otherwise, review of systems is positive for Weight change, chest pain, leg pain, palpitations, cough, shortness of breath with activity, wheezing, joint pain.   All other systems are reviewed and negative.    PHYSICAL EXAM: VS:  BP 126/68   Pulse 60   Ht 5\' 3"  (1.6 m)   Wt 216 lb 9.6 oz (98.2 kg)   BMI 38.37 kg/m  , BMI Body mass index is 38.37 kg/m. GEN: Well nourished, well developed, in no acute distress  HEENT: normal  Neck: no JVD, carotid bruits, or masses Cardiac: RRR; no murmurs, rubs, or gallops,no edema  Respiratory:  clear to auscultation bilaterally, normal work of breathing GI: soft, nontender, nondistended, + BS MS: no deformity or  atrophy  Skin: warm and dry,  device pocket is well healed Neuro:  Strength and sensation are intact Psych: euthymic mood, full affect  EKG:  EKG is ordered today. Personal review of the ekg ordered shows sinus rhythm with V pacing as well as sinus rhythm with intact conduction. Inferior and lateral TWI. Rate 60   Device interrogation is reviewed today in detail.  See PaceArt for details.   Recent Labs: 08/19/2015: ALT 21 11/12/2015: Hemoglobin 12.7; Platelets 154 12/08/2015: BUN 17; Creatinine, Ser 1.26; Potassium 3.9; Sodium 143; TSH 0.050    Lipid Panel     Component Value Date/Time   CHOL 200 (  H) 09/09/2015 1600   TRIG 418 (H) 09/09/2015 1600   HDL 44 09/09/2015 1600   CHOLHDL 4.5 (H) 09/09/2015 1600   LDLCALC Comment 09/09/2015 1600     Wt Readings from Last 3 Encounters:  02/11/16 216 lb 9.6 oz (98.2 kg)  01/22/16 217 lb (98.4 kg)  01/07/16 216 lb 9.6 oz (98.2 kg)      Other studies Reviewed: Additional studies/ records that were reviewed today include: TTE 09/02/15  Review of the above records today demonstrates:  - Left ventricle: The cavity size was normal. Posterior wall   thickness was increased.. Systolic function was normal. The   estimated ejection fraction was in the range of 60% to 65%. Wall   motion was normal; there were no regional wall motion   abnormalities. Doppler parameters are consistent with abnormal   left ventricular relaxation (grade 1 diastolic dysfunction). - Aortic valve: There was trivial regurgitation. - Left atrium: The atrium was mildly dilated.  01/22/16 Myoview  Nuclear stress EF: 54%.  Defect 1: There is a small defect of moderate severity present in the apical anterior and apex location.  This is a low risk study.  Findings consistent with prior myocardial infarction.  The left ventricular ejection fraction is mildly decreased (45-54%).  ASSESSMENT AND PLAN:  1.  Sick sinus syndrome: s/p dual chamber pacemaker. Pacemaker  functioning appropriately.  2. SOB: Her causes shortness of breath this possibly due to diastolic heart failure with fluid overload. I told her that she can increase her Lasix dose if she has gained more than 2 pounds in a day. She Doreena Maulden try this and get back to [place] with results.  3. Sleep apnea: Compliant with CPAP  4. Paroxysmal atrial fibrillation: Currently in sinus rhythm, but does have symptoms of shortness of breath when she is in atrial fibrillation. We'll therefore startt her on multaq today.  Current medicines are reviewed at length with the patient today.   The patient does not have concerns regarding her medicines.  The following changes were made today:  multaq  Labs/ tests ordered today include:  No orders of the defined types were placed in this encounter.    Disposition:   FU with Jerrianne Hartin 6 months  Signed, Kaeya Schiffer [name], MD  02/11/2016 10:09 AM     [facility] [address] [place] [address] [phone] (office) [phone] (fax)

## 2016-02-11 NOTE — Patient Instructions (Addendum)
Medication Instructions:    Your physician has recommended you make the following change in your medication:   1) START Multaq 400 mg twice a day  --- If you need a refill on your cardiac medications before your next appointment, please call your pharmacy. ---  Labwork:  None ordered  Testing/Procedures:  None ordered  Follow-Up:  Your physician recommends that you schedule a follow-up appointment in: 2 weeks for EKG in [place] office.   Remote monitoring is used to monitor your Pacemaker of ICD from home. This monitoring reduces the number of office visits required to check your device to one time per year. It allows [place] to keep an eye on the functioning of your device to ensure it is working properly. You are scheduled for a device check from home on 05/12/2016. You may send your transmission at any time that day. If you have a wireless device, the transmission will be sent automatically. After your physician reviews your transmission, you will receive a postcard with your next transmission date.   Your physician wants you to follow-up in: 6 months with Dr. Curt Bears.  You will receive a reminder letter in the mail two months in advance. If you don't receive a letter, please call our office to schedule the follow-up appointment.  Thank you for choosing [facility]!!   Trinidad Curet, RN [phone]   Any Other Special Instructions Will Be Listed Below (If Applicable). Dronedarone tablets What is this medicine? DRONEDARONE (droe NE da rone) is an antiarrhythmic drug. It helps make your heart beat regularly. COMMON BRAND NAME(S): Multaq What should I tell my health care provider before I take this medicine? They need to know if you have any of these conditions: -heart failure -history of irregular heartbeat -liver disease -liver or lung problems with the past use of amiodarone -low levels of magnesium in the blood -low levels of potassium in the blood -other heart  disease -an unusual or allergic reaction to dronedarone, other medicines, foods, dyes, or preservatives -pregnant or trying to get pregnant -breast-feeding How should I use this medicine? Take this medicine by mouth with a glass of water. Follow the directions on the prescription label. Take one tablet with the morning meal and one tablet with the evening meal. Do not take your medicine more often than directed. Do not stop taking except on the advice of your doctor or health care professional. A special MedGuide will be given to you by the pharmacist with each prescription and refill. Be sure to read this information carefully each time. Talk to your pediatrician regarding the use of this medicine in children. Special care may be needed. What if I miss a dose? If you miss a dose, take it as soon as you can. If it is almost time for your next dose, take only that dose. Do not take double or extra doses. What may interact with this medicine? Do not take this medicine with any of the following medications: -arsenic trioxide -certain antibiotics like clarithromycin, erythromycin, pentamidine, telithromycin, troleandomycin -certain medicines for depression like tricyclic antidepressants -certain medicines for fungal infections like fluconazole, itraconazole, ketoconazole, posaconazole, voriconazole -certain medicines for irregular heart beat like amiodarone, disopyramide, dofetilide, flecainide, ibutilide, quinidine, propafenone, sotalol -certain medicines for malaria like chloroquine, halofantrine -cisapride -cyclosporine -droperidol -haloperidol -methadone -other medicines that prolong the QT interval (cause an abnormal heart rhythm) -pimozide -nefazodone -phenothiazines like chlorpromazine, mesoridazine, prochlorperazine, thioridazine -ritonavir -ziprasidone This medicine may also interact with the following medications: -certain medicines for blood  pressure, heart disease, or irregular  heart beat like diltiazem, metoprolol, propranolol, verapamil -certain medicines for cholesterol like atorvastatin, lovastatin, simvastatin -certain medicines for seizures like carbamazepine, phenobarbital, phenytoin -digoxin -grapefruit juice -rifampin -sirolimus -St. John's Wort -tacrolimus What should I watch for while using this medicine? Your condition will be monitored closely when you first begin therapy. Often, this drug is first started in a hospital or other monitored health care setting. Once you are on maintenance therapy, visit your doctor or health care professional for regular checks on your progress. Because your condition and use of this medicine carry some risk, it is a good idea to carry an identification card, necklace or bracelet with details of your condition, medications, and doctor or health care professional. Dennis Bast may get drowsy or dizzy. Do not drive, use machinery, or do anything that needs mental alertness until you know how this medicine affects you. Do not stand or sit up quickly, especially if you are an older patient. This reduces the risk of dizzy or fainting spells. What side effects may I notice from receiving this medicine? Side effects that you should report to your doctor or health care professional as soon as possible: -allergic reactions like skin rash, itching or hives, swelling of the face, lips, or tongue -breathing problems -cough -dark urine -fast, irregular heartbeat -general ill feeling or flu-like symptoms -light-colored stools -loss of appetite, nausea -right upper belly pain -slow heartbeat -stomach pain -swelling of the legs or ankles -unusually weak or tired -weight gain -yellowing of the eyes or skin Side effects that usually do not require medical attention (report to your doctor or health care professional if they continue or are bothersome): -nausea -vomiting -stomach pain Where should I keep my medicine? Keep out of the reach  of children. Store at room temperature between 15 and 30 degrees C (59 and 86 degrees F). Throw away any unused medicine after the expiration date.  2017 [employer]vier/Gold Standard (2015-04-02 12:43:06)

## 2016-02-22 DIAGNOSIS — H353112 Nonexudative age-related macular degeneration, right eye, intermediate dry stage: Secondary | ICD-10-CM | POA: Diagnosis not present

## 2016-02-22 DIAGNOSIS — H353221 Exudative age-related macular degeneration, left eye, with active choroidal neovascularization: Secondary | ICD-10-CM | POA: Diagnosis not present

## 2016-02-22 DIAGNOSIS — H43813 Vitreous degeneration, bilateral: Secondary | ICD-10-CM | POA: Diagnosis not present

## 2016-02-22 DIAGNOSIS — H01001 Unspecified blepharitis right upper eyelid: Secondary | ICD-10-CM | POA: Diagnosis not present

## 2016-02-24 ENCOUNTER — Telehealth: Payer: Self-pay | Admitting: Internal Medicine

## 2016-02-24 NOTE — Telephone Encounter (Signed)
Pt called asking for refill on Rx levothyroxine

## 2016-02-29 DIAGNOSIS — J449 Chronic obstructive pulmonary disease, unspecified: Secondary | ICD-10-CM | POA: Diagnosis not present

## 2016-03-02 ENCOUNTER — Ambulatory Visit (INDEPENDENT_AMBULATORY_CARE_PROVIDER_SITE_OTHER): Payer: Medicare Other | Admitting: *Deleted

## 2016-03-02 ENCOUNTER — Ambulatory Visit (INDEPENDENT_AMBULATORY_CARE_PROVIDER_SITE_OTHER): Payer: Medicare Other

## 2016-03-02 VITALS — HR 60 | Ht 61.5 in | Wt 213.0 lb

## 2016-03-02 DIAGNOSIS — I4891 Unspecified atrial fibrillation: Secondary | ICD-10-CM

## 2016-03-02 DIAGNOSIS — I480 Paroxysmal atrial fibrillation: Secondary | ICD-10-CM | POA: Diagnosis not present

## 2016-03-02 DIAGNOSIS — Z7901 Long term (current) use of anticoagulants: Secondary | ICD-10-CM | POA: Diagnosis not present

## 2016-03-02 DIAGNOSIS — I25708 Atherosclerosis of coronary artery bypass graft(s), unspecified, with other forms of angina pectoris: Secondary | ICD-10-CM | POA: Diagnosis not present

## 2016-03-02 LAB — POCT INR: INR: 4.9

## 2016-03-02 NOTE — Patient Instructions (Signed)
Keep follow up with Dr. Rockey Situ and if any problems give [place] a call.  It was a pleasure seeing you today here in the office. Please do not hesitate to give [place] a call back if you have any further questions. Ward, BSN

## 2016-03-05 DIAGNOSIS — J440 Chronic obstructive pulmonary disease with acute lower respiratory infection: Secondary | ICD-10-CM | POA: Diagnosis not present

## 2016-03-10 ENCOUNTER — Ambulatory Visit: Payer: Medicare Other | Admitting: Family Medicine

## 2016-03-10 ENCOUNTER — Other Ambulatory Visit: Payer: Self-pay | Admitting: Internal Medicine

## 2016-03-10 ENCOUNTER — Encounter: Payer: Self-pay | Admitting: Internal Medicine

## 2016-03-10 ENCOUNTER — Ambulatory Visit (INDEPENDENT_AMBULATORY_CARE_PROVIDER_SITE_OTHER): Payer: Medicare Other | Admitting: Internal Medicine

## 2016-03-10 VITALS — BP 110/68 | HR 82 | Temp 97.9°F | Ht 63.0 in | Wt 207.0 lb

## 2016-03-10 DIAGNOSIS — E890 Postprocedural hypothyroidism: Secondary | ICD-10-CM | POA: Diagnosis not present

## 2016-03-10 DIAGNOSIS — I25708 Atherosclerosis of coronary artery bypass graft(s), unspecified, with other forms of angina pectoris: Secondary | ICD-10-CM | POA: Diagnosis not present

## 2016-03-10 DIAGNOSIS — J441 Chronic obstructive pulmonary disease with (acute) exacerbation: Secondary | ICD-10-CM | POA: Diagnosis not present

## 2016-03-10 DIAGNOSIS — E1121 Type 2 diabetes mellitus with diabetic nephropathy: Secondary | ICD-10-CM | POA: Diagnosis not present

## 2016-03-10 DIAGNOSIS — N183 Chronic kidney disease, stage 3 unspecified: Secondary | ICD-10-CM

## 2016-03-10 MED ORDER — CEFDINIR 300 MG PO CAPS: 300.0000 mg | ORAL_CAPSULE | Freq: Two times a day (BID) | ORAL | 0 refills | Status: DC

## 2016-03-10 MED ORDER — FUROSEMIDE 20 MG PO TABS: ORAL_TABLET | ORAL | 1 refills | Status: DC

## 2016-03-10 NOTE — Patient Instructions (Signed)
Probiotics - take for one month   Use albuterol inhaler 2-3 times a day - not as needed - while taking antibiotics.  May stop Januvia -  Check BS daily and if >180 for three days in a row, then call.

## 2016-03-10 NOTE — Progress Notes (Signed)
Date:  03/10/2016   Name:  Dawn Garcia   DOB:  May 23, 1940   MRN:  [id number]   Chief Complaint: Diabetes (glucose 150 this morning) Diabetes  She presents for her follow-up diabetic visit. She has type 2 diabetes mellitus. Her disease course has been stable. Hypoglycemia symptoms include headaches. Pertinent negatives for hypoglycemia include no dizziness or nervousness/anxiousness. Associated symptoms include fatigue. Pertinent negatives for diabetes include no chest pain. Current diabetic treatment includes oral agent (dual therapy). She is compliant with treatment all of the time. There is no change in her home blood glucose trend. Her breakfast blood glucose is taken between 7-8 am. Her breakfast blood glucose range is generally 140-180 mg/dl.  Cough  This is a new problem. The current episode started 1 to 4 weeks ago. The problem has been unchanged. The problem occurs every few minutes. Cough characteristics: loose but minimally productive of yellow phlegm. Associated symptoms include headaches, shortness of breath and wheezing. Pertinent negatives include no chest pain, chills, fever or postnasal drip. Treatments tried: completed course of zithromycine.  CAD - now followed by Cardiology. Felt to have had an MI in the recent past.  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  She is also now on Multaq - may be causing some diarrhea but could also be from recent antibiotics.  Lab Results  Component Value Date   HGBA1C 7.8 (H) 12/08/2015   Lab Results  Component Value Date   CREATININE 1.26 (H) 12/08/2015    Review of Systems  Constitutional: Positive for fatigue. Negative for chills and fever.  HENT: Negative for congestion, postnasal drip and sinus pressure.   Respiratory: Positive for cough, shortness of breath and wheezing. Negative for choking, chest tightness and stridor.   Cardiovascular: Negative for chest pain, palpitations and leg swelling.  Gastrointestinal:  Positive for diarrhea. Negative for vomiting.  Genitourinary: Positive for urgency. Negative for dysuria and menstrual problem.  Musculoskeletal: Negative for arthralgias.  Neurological: Positive for headaches. Negative for dizziness and numbness.  Psychiatric/Behavioral: Negative for sleep disturbance. The patient is not nervous/anxious.     Patient Active Problem List   Diagnosis Date Noted  . Hyperlipidemia 09/18/2015  . Symptomatic bradycardia 09/18/2015  . Coronary artery disease 09/10/2015  . B12 deficiency 09/10/2015  . History of melanoma 09/09/2015  . COPD (chronic obstructive pulmonary disease) ([place]) 09/09/2015  . Hypothyroidism following radioiodine therapy 09/09/2015  . Long term (current) use of anticoagulants [Z79.01] 08/26/2015  . Obesity, Class II, BMI 35-39.9 08/20/2015  . GERD with stricture 08/20/2015  . Macular degeneration 08/20/2015  . Diastolic CHF ([place]) 123456  . History of parathyroidectomy ([place]) 08/20/2015  . CKD (chronic kidney disease) stage 3, GFR 30-59 ml/min 08/20/2015  . PAF (paroxysmal atrial fibrillation) ([place]) 08/19/2015  . Controlled type 2 diabetes mellitus with diabetic nephropathy, without long-term current use of insulin ([place]) 08/19/2015  . Primary gout 08/19/2015    Prior to Admission medications   Medication Sig Start Date End Date Taking? Authorizing Provider  albuterol (PROVENTIL HFA;VENTOLIN HFA) 108 (90 Base) MCG/ACT inhaler Inhale 2 puffs into the lungs every 6 (six) hours as needed for wheezing or shortness of breath.   Yes Historical Provider, MD  allopurinol (ZYLOPRIM) 100 MG tablet Take 1 tablet (100 mg total) by mouth daily. 12/21/15  Yes Glean Hess, MD  ARTIFICIAL TEAR OP Place 1 drop into both eyes as needed (for dry eyes).   Yes Historical Provider, MD  Cholecalciferol (VITAMIN D3) 5000  units TABS Take 1 tablet by mouth every morning.   Yes Historical Provider, MD  dronedarone (MULTAQ) 400 MG tablet Take 1 tablet (400  mg total) by mouth 2 (two) times daily with a meal. 02/11/16  Yes Will [name], MD  furosemide (LASIX) 20 MG tablet Take 40mg  by mouth every other day alternating with 60mg  by mouth every other day. 09/16/15  Yes Adline Potter, MD  isosorbide mononitrate (IMDUR) 30 MG 24 hr tablet Take 1 tablet (30 mg total) by mouth daily. 02/11/16  Yes Will [name], MD  levothyroxine (SYNTHROID, LEVOTHROID) 150 MCG tablet Take 1 tablet (150 mcg total) by mouth daily. 12/08/15  Yes Glean Hess, MD  Melatonin 5 MG TABS Take 1 tablet by mouth at bedtime.   Yes Historical Provider, MD  metFORMIN (GLUCOPHAGE) 500 MG tablet Take 1 tablet (500 mg total) by mouth 2 (two) times daily with a meal. 09/16/15  Yes Adline Potter, MD  metoprolol succinate (TOPROL XL) 25 MG 24 hr tablet Take 1 tablet (25 mg total) by mouth daily. 02/11/16  Yes Will [name], MD  nitroGLYCERIN (NITROSTAT) 0.4 MG SL tablet Place 0.4 mg under the tongue every 5 (five) minutes as needed for chest pain.   Yes Historical Provider, MD  pantoprazole (PROTONIX) 40 MG tablet Take 1 tablet (40 mg total) by mouth daily. 10/16/15  Yes Juline Patch, MD  potassium chloride (KLOR-CON) 8 MEQ tablet Take 1 tablet (8 mEq total) by mouth 2 (two) times daily. 12/21/15  Yes Glean Hess, MD  rosuvastatin (CRESTOR) 40 MG tablet Take 1 tablet (40 mg total) by mouth daily. 09/18/15  Yes Minna Merritts, MD  sitaGLIPtin (JANUVIA) 100 MG tablet Take 1 tablet (100 mg total) by mouth daily. 09/16/15  Yes Adline Potter, MD  vitamin B-12 (CYANOCOBALAMIN) 500 MCG tablet Take 500 mcg by mouth daily.   Yes Historical Provider, MD  warfarin (COUMADIN) 7.5 MG tablet Take 1 tablet (7.5 mg total) by mouth daily. 01/20/16  Yes Minna Merritts, MD    Allergies  Allergen Reactions  . Demerol [Meperidine] Anaphylaxis    Tolerated Fentanyl 11/10/15  . Sulfa Antibiotics Anaphylaxis  . Tetracyclines & Related Other (See Comments)    Made nose, lips,  And tongue itchy     Past Surgical History:  Procedure Laterality Date  . CHOLECYSTECTOMY    . COLONOSCOPY  2015   1 polyp removed- repeat 3 years  . CORONARY ANGIOPLASTY WITH STENT PLACEMENT    . EP IMPLANTABLE DEVICE N/A 11/10/2015   Procedure: Pacemaker Implant;  Surgeon: Will [name], MD;  Location: [facility];  Service: Cardiovascular;  Laterality: N/A;  . EP IMPLANTABLE DEVICE N/A 11/11/2015   Procedure: Lead Revision/Repair;  Surgeon: Evans Lance, MD;  Location: [facility];  Service: Cardiovascular;  Laterality: N/A;  . parathyroid surgery     x 2  . THYROID SURGERY    . TOTAL SHOULDER ARTHROPLASTY Right    x 2  . VAGINAL HYSTERECTOMY      Social History  Substance Use Topics  . Smoking status: Never Smoker  . Smokeless tobacco: Never Used  . Alcohol use 0.0 oz/week     Medication list has been reviewed and updated.   Physical Exam  Constitutional: She is oriented to person, place, and time. She appears well-developed. No distress.  HENT:  Head: Normocephalic and atraumatic.  Right Ear: Tympanic membrane is retracted.  Left Ear: Tympanic membrane is retracted.  Nose: Right sinus exhibits no maxillary sinus tenderness. Left sinus exhibits no maxillary sinus tenderness.  Mouth/Throat: No posterior oropharyngeal edema or posterior oropharyngeal erythema.  Eyes: Pupils are equal, round, and reactive to light.  Neck: Normal range of motion. Neck supple. No thyromegaly present.  Cardiovascular: Normal rate, regular rhythm and normal heart sounds.   Pulmonary/Chest: Effort normal. No respiratory distress. She has rales in the right lower field and the left lower field.  Musculoskeletal: Normal range of motion.  Neurological: She is alert and oriented to person, place, and time.  Skin: Skin is warm and dry. No rash noted.  Psychiatric: She has a normal mood and affect. Her speech is normal and behavior is normal. Thought content normal.  Nursing note and vitals  reviewed.   BP 110/68   Pulse 82   Temp 97.9 F (36.6 C)   Ht 5\' 3"  (1.6 m)   Wt 207 lb (93.9 kg)   SpO2 97%   BMI 36.67 kg/m   Assessment and Plan: 1. Controlled type 2 diabetes mellitus with diabetic nephropathy, without long-term current use of insulin (HCC) Hold Januvia; monitor BS and if > 180 consistently - call for instructions - Basic metabolic panel - Hemoglobin A1c  2. Hypothyroidism following radioiodine therapy supplemented - TSH  3. CKD (chronic kidney disease) stage 3, GFR 30-59 ml/min Continue to monitor - Basic metabolic panel - furosemide (LASIX) 20 MG tablet; Take 40mg  by mouth every other day alternating with 60mg  by mouth every other day.  Dispense: 270 tablet; Refill: 1  4. COPD with acute exacerbation (HCC) Use albuterol tid Begin probiotics - take for one month  - cefdinir (OMNICEF) 300 MG capsule; Take 1 capsule (300 mg total) by mouth 2 (two) times daily.  Dispense: 20 capsule; Refill: 0   Halina Maidens, MD [facility]  03/10/2016

## 2016-03-11 LAB — CUP PACEART INCLINIC DEVICE CHECK
Date Time Interrogation Session: 20171229170240
Implantable Lead Implant Date: 20170829
Implantable Lead Location: 753860
Implantable Pulse Generator Implant Date: 20170829
MDC IDC LEAD IMPLANT DT: 20170829
MDC IDC LEAD LOCATION: 753859
MDC IDC PG SERIAL: 7945290

## 2016-03-11 LAB — BASIC METABOLIC PANEL
BUN/Creatinine Ratio: 15 (ref 12–28)
BUN: 25 mg/dL (ref 8–27)
CALCIUM: 9.2 mg/dL (ref 8.7–10.3)
CHLORIDE: 96 mmol/L (ref 96–106)
CO2: 23 mmol/L (ref 18–29)
CREATININE: 1.69 mg/dL — AB (ref 0.57–1.00)
GFR calc Af Amer: 34 mL/min/{1.73_m2} — ABNORMAL LOW (ref >59)
GFR calc non Af Amer: 29 mL/min/{1.73_m2} — ABNORMAL LOW (ref >59)
GLUCOSE: 237 mg/dL — AB (ref 65–99)
Potassium: 3.7 mmol/L (ref 3.5–5.2)
Sodium: 140 mmol/L (ref 134–144)

## 2016-03-11 LAB — HEMOGLOBIN A1C
ESTIMATED AVERAGE GLUCOSE: 169 mg/dL
HEMOGLOBIN A1C: 7.5 % — AB (ref 4.8–5.6)

## 2016-03-11 LAB — TSH: TSH: 0.034 u[IU]/mL — ABNORMAL LOW (ref 0.450–4.500)

## 2016-03-16 ENCOUNTER — Encounter: Payer: Self-pay | Admitting: Internal Medicine

## 2016-03-16 ENCOUNTER — Ambulatory Visit (INDEPENDENT_AMBULATORY_CARE_PROVIDER_SITE_OTHER): Payer: Medicare Other

## 2016-03-16 DIAGNOSIS — I4891 Unspecified atrial fibrillation: Secondary | ICD-10-CM | POA: Diagnosis not present

## 2016-03-16 DIAGNOSIS — I480 Paroxysmal atrial fibrillation: Secondary | ICD-10-CM

## 2016-03-16 DIAGNOSIS — Z7901 Long term (current) use of anticoagulants: Secondary | ICD-10-CM | POA: Diagnosis not present

## 2016-03-16 LAB — POCT INR: INR: 4.2

## 2016-04-01 DIAGNOSIS — H40003 Preglaucoma, unspecified, bilateral: Secondary | ICD-10-CM | POA: Diagnosis not present

## 2016-04-04 ENCOUNTER — Ambulatory Visit (INDEPENDENT_AMBULATORY_CARE_PROVIDER_SITE_OTHER): Payer: Medicare Other | Admitting: Cardiovascular Disease

## 2016-04-04 ENCOUNTER — Ambulatory Visit (INDEPENDENT_AMBULATORY_CARE_PROVIDER_SITE_OTHER): Payer: Medicare Other

## 2016-04-04 ENCOUNTER — Encounter: Payer: Self-pay | Admitting: Cardiovascular Disease

## 2016-04-04 VITALS — BP 110/68 | HR 64 | Ht 63.0 in | Wt 212.5 lb

## 2016-04-04 DIAGNOSIS — I5032 Chronic diastolic (congestive) heart failure: Secondary | ICD-10-CM | POA: Diagnosis not present

## 2016-04-04 DIAGNOSIS — I4891 Unspecified atrial fibrillation: Secondary | ICD-10-CM | POA: Diagnosis not present

## 2016-04-04 DIAGNOSIS — I480 Paroxysmal atrial fibrillation: Secondary | ICD-10-CM | POA: Diagnosis not present

## 2016-04-04 DIAGNOSIS — Z7189 Other specified counseling: Secondary | ICD-10-CM

## 2016-04-04 DIAGNOSIS — J432 Centrilobular emphysema: Secondary | ICD-10-CM

## 2016-04-04 DIAGNOSIS — I2510 Atherosclerotic heart disease of native coronary artery without angina pectoris: Secondary | ICD-10-CM

## 2016-04-04 DIAGNOSIS — H43813 Vitreous degeneration, bilateral: Secondary | ICD-10-CM | POA: Diagnosis not present

## 2016-04-04 DIAGNOSIS — H353221 Exudative age-related macular degeneration, left eye, with active choroidal neovascularization: Secondary | ICD-10-CM | POA: Diagnosis not present

## 2016-04-04 DIAGNOSIS — H35033 Hypertensive retinopathy, bilateral: Secondary | ICD-10-CM | POA: Diagnosis not present

## 2016-04-04 DIAGNOSIS — H353112 Nonexudative age-related macular degeneration, right eye, intermediate dry stage: Secondary | ICD-10-CM | POA: Diagnosis not present

## 2016-04-04 DIAGNOSIS — E1121 Type 2 diabetes mellitus with diabetic nephropathy: Secondary | ICD-10-CM | POA: Diagnosis not present

## 2016-04-04 DIAGNOSIS — E782 Mixed hyperlipidemia: Secondary | ICD-10-CM | POA: Diagnosis not present

## 2016-04-04 DIAGNOSIS — Z7901 Long term (current) use of anticoagulants: Secondary | ICD-10-CM | POA: Diagnosis not present

## 2016-04-04 LAB — POCT INR: INR: 2.2

## 2016-04-04 NOTE — Patient Instructions (Addendum)
We will schedule an appt with Dr. Caryl Comes for atrial fib  Medication Instructions:   Please cut the multaq in 1/2 twice a day for one week If no improvement in diarrhea,  Stop the pill  Extra metoprolol for atrial fib  3 potassium a day when you have diarrhea  Labwork:  Fasting lipids and bmp at your convenience  Testing/Procedures:  No further testing at this time   I recommend watching educational videos on topics of interest to you at:       [email or web address]  Enter code: [email or web address]    Follow-Up: It was a pleasure seeing you in the office today. Please call us if you have new issues that need to be addressed before your next appt.  [phone]  Your physician wants you to follow-up in: 6 months.  You will receive a reminder letter in the mail two months in advance. If you don't receive a letter, please call our office to schedule the follow-up appointment.  If you need a refill on your cardiac medications before your next appointment, please call your pharmacy.

## 2016-04-04 NOTE — Progress Notes (Signed)
Cardiology Office Note  Date:  04/04/2016   ID:  Dawn Garcia, DOB [birth year], MRN [id number]  PCP:  Halina Maidens, MD   Chief Complaint  Patient presents with  . other    1 month follow up from EKG done on 03/02/2016 as well as a myoview on 01/22/2016. Meds reviewed by the pt. verbally. Pt. c/o difficulty of diarrhea with the Multaq.  The patient also has a runny nose.      HPI:  Dawn Garcia is a pleasant 76 year old woman with a history of coronary artery disease, stent in January 2010 (location unclear), follow-up catheterization 07/26/2012 showing stable mild coronary artery disease, patent stent, paroxysmal atrial fibrillation with history of ablation, who presents for routine follow-up for symptoms of shortness of breath Prior history of ablation in 2013 Event monitor recently showing conversion pauses 5 seconds Pacemaker placed Paroxysmal atrial fibrillation seems to be associated with shortness of breath episodes  Recent stress test reviewed with her in detail Stress test 08/2015:no ischemia, Small apical defect Stress test 01/2016: no ischemia, Small apical defect  On multaq for atrial fib, maybe causing diarrhea Less SOB Since she has been on the medication Possibly less atrial fib Diarrheas debilitating  Lab work reviewed with her in detail HBA1C 7.5 Cr 1.69 Takes 2 potassium a day, last level was 3.7, prior to onset of diarrhea Total chol 200,   started on Crestor  EKG on today's visit shows paced rhythm rate 64 bpm, unable to estimate QTC  Other past medical history reviewed with her today Diagnosed with sick sinus syndrome August 2017, conversion pauses  She had  Implantation of a SJM dual chamber PPM on 11/10/15 by Dr Curt Bears.   The patient received a [place] [id number] (serial number [id number] ), with Miller [id number] (serial number [id number]) right atrial lead and a [employer] model [id number] (serial number  [id number]) right ventricular lead.  There were no immediate post procedure complications. 2. Conclusion: Successful atrial lead revision in a patient with sinus node dysfunction status post permanent pacemaker insertion who had previously had dislodgment of her atrial lead  Echocardiogram June 2017 with normal ejection fraction, 60%     PMH:   has a past medical history of Atrial fibrillation ([place]); CAD (coronary artery disease); Cancer [facility]); CHF (congestive heart failure) ([place]); COPD (chronic obstructive pulmonary disease) ([place]); Diabetes mellitus without complication ([place]); GERD (gastroesophageal reflux disease); Hyperlipidemia; Hypocalcemia; Hypokalemia; Macular degeneration; Myocardial infarction; Presence of permanent cardiac pacemaker (10/2015); Sinus node dysfunction ([place]); Thyroid disease; Vitamin B12 deficiency; and Vitamin D deficiency.  PSH:    Past Surgical History:  Procedure Laterality Date  . CHOLECYSTECTOMY    . COLONOSCOPY  2015   1 polyp removed- repeat 3 years  . CORONARY ANGIOPLASTY WITH STENT PLACEMENT    . EP IMPLANTABLE DEVICE N/A 11/10/2015   Procedure: Pacemaker Implant;  Surgeon: Will [name], MD;  Location: [facility];  Service: Cardiovascular;  Laterality: N/A;  . EP IMPLANTABLE DEVICE N/A 11/11/2015   Procedure: Lead Revision/Repair;  Surgeon: Evans Lance, MD;  Location: [facility];  Service: Cardiovascular;  Laterality: N/A;  . parathyroid surgery     x 2  . THYROID SURGERY    . TOTAL SHOULDER ARTHROPLASTY Right    x 2  . VAGINAL HYSTERECTOMY      Current Outpatient Prescriptions  Medication Sig Dispense Refill  . albuterol (PROVENTIL HFA;VENTOLIN HFA) 108 (90 Base) MCG/ACT inhaler Inhale  2 puffs into the lungs every 6 (six) hours as needed for wheezing or shortness of breath.    . ARTIFICIAL TEAR OP Place 1 drop into both eyes as needed (for dry eyes).    . cefdinir (OMNICEF) 300 MG capsule Take 1 capsule (300 mg total) by  mouth 2 (two) times daily. 20 capsule 0  . Cholecalciferol (VITAMIN D3) 5000 units TABS Take 1 tablet by mouth every morning.    . dronedarone (MULTAQ) 400 MG tablet Take 1 tablet (400 mg total) by mouth 2 (two) times daily with a meal. 60 tablet 6  . furosemide (LASIX) 20 MG tablet Take 60 mg by mouth every morning.    . isosorbide mononitrate (IMDUR) 30 MG 24 hr tablet Take 1 tablet (30 mg total) by mouth daily. 90 tablet 3  . levothyroxine (SYNTHROID, LEVOTHROID) 150 MCG tablet Take 1 tablet (150 mcg total) by mouth daily. 90 tablet 1  . Melatonin 5 MG TABS Take 1 tablet by mouth at bedtime.    . metFORMIN (GLUCOPHAGE) 500 MG tablet Take 1 tablet (500 mg total) by mouth 2 (two) times daily with a meal. 180 tablet 1  . metoprolol succinate (TOPROL XL) 25 MG 24 hr tablet Take 1 tablet (25 mg total) by mouth daily. 90 tablet 3  . nitroGLYCERIN (NITROSTAT) 0.4 MG SL tablet Place 0.4 mg under the tongue every 5 (five) minutes as needed for chest pain.    . pantoprazole (PROTONIX) 40 MG tablet Take 1 tablet (40 mg total) by mouth daily. 90 tablet 1  . potassium chloride (KLOR-CON) 8 MEQ tablet Take 1 tablet (8 mEq total) by mouth 2 (two) times daily. 180 tablet 2  . rosuvastatin (CRESTOR) 40 MG tablet Take 1 tablet (40 mg total) by mouth daily. 30 tablet 11  . sitaGLIPtin (JANUVIA) 100 MG tablet Take 1 tablet (100 mg total) by mouth daily. (Patient taking differently: Take 50 mg by mouth daily. ) 90 tablet 1  . vitamin B-12 (CYANOCOBALAMIN) 500 MCG tablet Take 500 mcg by mouth daily.    Marland Kitchen warfarin (COUMADIN) 7.5 MG tablet Take 1 tablet (7.5 mg total) by mouth daily. 100 tablet 1   No current facility-administered medications for this visit.      Allergies:   Demerol [meperidine]; Sulfa antibiotics; and Tetracyclines & related   Social History:  The patient  reports that she has never smoked. She has never used smokeless tobacco. She reports that she drinks alcohol. She reports that she does not  use drugs.   Family History:   family history includes Heart defect (age of onset: 25) in her father; Thyroid disease (age of onset: 63) in her mother.    Review of Systems: Review of Systems  Constitutional: Negative.   Respiratory: Positive for shortness of breath.   Cardiovascular: Negative.   Gastrointestinal: Positive for diarrhea.  Musculoskeletal: Negative.   Neurological: Negative.   Psychiatric/Behavioral: Negative.   All other systems reviewed and are negative.    PHYSICAL EXAM: VS:  BP 110/68 (BP Location: Left Arm, Patient Position: Sitting, Cuff Size: Normal)   Pulse 64   Ht 5\' 3"  (1.6 m)   Wt 212 lb 8 oz (96.4 kg)   BMI 37.64 kg/m  , BMI Body mass index is 37.64 kg/m. GEN: Well nourished, well developed, in no acute distress , obese HEENT: normal  Neck: no JVD, carotid bruits, or masses Cardiac: RRR; no murmurs, rubs, or gallops,no edema  Respiratory:  clear to auscultation bilaterally, normal  work of breathing GI: soft, nontender, nondistended, + BS Dawn: no deformity or atrophy  Skin: warm and dry, no rash Neuro:  Strength and sensation are intact Psych: euthymic mood, full affect    Recent Labs: 08/19/2015: ALT 21 11/12/2015: Hemoglobin 12.7; Platelets 154 03/10/2016: BUN 25; Creatinine, Ser 1.69; Potassium 3.7; Sodium 140; TSH 0.034    Lipid Panel Lab Results  Component Value Date   CHOL 200 (H) 09/09/2015   HDL 44 09/09/2015   [place] Comment 09/09/2015   TRIG 418 (H) 09/09/2015      Wt Readings from Last 3 Encounters:  04/04/16 212 lb 8 oz (96.4 kg)  03/10/16 207 lb (93.9 kg)  03/02/16 213 lb (96.6 kg)       ASSESSMENT AND PLAN:  PAF (paroxysmal atrial fibrillation) ([place]) - Plan: EKG 12-Lead, Lipid Profile, Hepatic function panel, Ambulatory referral to Cardiac Electrophysiology She prefers to be seen in [facility] for her electrical issues Given her profound diarrhea, recommended she decrease the multaq down to one half pill twice a  day. If continued diarrhea, would hold the medication If symptoms improve, perhaps could take multaq with ranexa. Alternate option would be to change antiarrhythmics Also recommended extra beta blocker for breakthrough arrhythmia  Mixed hyperlipidemia - Plan: Lipid Profile, Hepatic function panel, Ambulatory referral to Cardiac Electrophysiology Order placed to have cholesterol checked fasting at her convenience Continue Crestor for now  Controlled type 2 diabetes mellitus with diabetic nephropathy, without long-term current use of insulin ([place]) - Plan: Lipid Profile, Hepatic function panel, Ambulatory referral to Cardiac Electrophysiology We have encouraged continued exercise, careful diet management in an effort to lose weight.  Coronary artery disease involving native coronary artery of native heart without angina pectoris - Plan: EKG 12-Lead, Lipid Profile, Hepatic function panel, Ambulatory referral to Cardiac Electrophysiology Denies any chest pain concerning for angina Long discussion concerning her recent stress test 2 Fixed defect apical region, no significant ischemia  Chronic diastolic congestive heart failure ([place]) - Plan: EKG 12-Lead, Lipid Profile, Hepatic function panel, Ambulatory referral to Cardiac Electrophysiology Currently not on a diuretic Had acute renal failure several weeks ago, diabetes medications were changed Order placed to have repeat BMP  Centrilobular emphysema ([place]) - Plan: Lipid Profile, Hepatic function panel, Ambulatory referral to Cardiac Electrophysiology Reports symptoms are stable, shortness of breath improved with rhythm control  Encounter for anticoagulation discussion and counseling - Plan: Lipid Profile, Hepatic function panel, Ambulatory referral to Cardiac Electrophysiology Tolerating anticoagulation, no bleeding, steady gait   Total encounter time more than 25 minutes  Greater than 50% was spent in counseling and coordination of care with  the patient   Disposition:   F/U  6 months   Orders Placed This Encounter  Procedures  . Lipid Profile  . Hepatic function panel  . Ambulatory referral to Cardiac Electrophysiology  . EKG 12-Lead     Signed, Esmond Plants, M.D., Ph.D. 04/04/2016  [employer], [place]

## 2016-04-14 ENCOUNTER — Other Ambulatory Visit: Payer: Self-pay | Admitting: Family Medicine

## 2016-04-18 ENCOUNTER — Telehealth: Payer: Self-pay | Admitting: Internal Medicine

## 2016-04-18 NOTE — Telephone Encounter (Signed)
Pt stated still having diarrhea to Rx. metformin 500mg ....please advice...also need refill Rx. Pantoprazole

## 2016-04-19 ENCOUNTER — Encounter: Payer: Self-pay | Admitting: Cardiology

## 2016-04-19 ENCOUNTER — Encounter: Payer: Self-pay | Admitting: Internal Medicine

## 2016-04-19 ENCOUNTER — Other Ambulatory Visit: Payer: Medicare Other

## 2016-04-19 ENCOUNTER — Ambulatory Visit (INDEPENDENT_AMBULATORY_CARE_PROVIDER_SITE_OTHER): Payer: Medicare Other | Admitting: Internal Medicine

## 2016-04-19 VITALS — BP 110/70 | HR 70 | Ht 63.0 in | Wt 210.0 lb

## 2016-04-19 DIAGNOSIS — I480 Paroxysmal atrial fibrillation: Secondary | ICD-10-CM

## 2016-04-19 DIAGNOSIS — E782 Mixed hyperlipidemia: Secondary | ICD-10-CM

## 2016-04-19 DIAGNOSIS — I2510 Atherosclerotic heart disease of native coronary artery without angina pectoris: Secondary | ICD-10-CM

## 2016-04-19 DIAGNOSIS — I5032 Chronic diastolic (congestive) heart failure: Secondary | ICD-10-CM | POA: Diagnosis not present

## 2016-04-19 DIAGNOSIS — Z950 Presence of cardiac pacemaker: Secondary | ICD-10-CM | POA: Diagnosis not present

## 2016-04-19 DIAGNOSIS — Z7189 Other specified counseling: Secondary | ICD-10-CM

## 2016-04-19 DIAGNOSIS — E1121 Type 2 diabetes mellitus with diabetic nephropathy: Secondary | ICD-10-CM

## 2016-04-19 DIAGNOSIS — I495 Sick sinus syndrome: Secondary | ICD-10-CM

## 2016-04-19 DIAGNOSIS — J432 Centrilobular emphysema: Secondary | ICD-10-CM

## 2016-04-19 NOTE — Progress Notes (Signed)
ELECTROPHYSIOLOGY OFFICE  NOTE  Patient ID: Dawn Garcia, MRN: [id number], DOB/AGE: 14-Apr-1940 76 y.o. Admit date: (Not on file) Date of Consult: 04/19/2016  Primary Physician: Halina Maidens, MD Primary Cardiologist: tg    Chief Complaint: *ESTABLISH   HPI Dawn Garcia is a 76 y.o. female  Who underwent pacing by Dr. 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 This was complicated by right atrial lead dislodgment requiring revision Because of recurrent atrial fibrillation he started her on dronaderone in the fall which was complicated by diarrhea. It was her impression however that prior to its discontinuation it had been associated with a marked improvement in exercise tolerance, allowing her to walk a flight of stairs without stopping for example.  She has a history of a prior PCI.  She is intermittently aware of atrial fibrillation.  She has treated sleep apnea but her card has not been read in more than 5 years. She also complains of significant fatigue. She notes that her blood pressure is lower.      CHA2DS2/VAS Stroke Risk Points  6       1 Has Congestive Heart Failure:  Yes   1 Has Vascular Disease:  Yes   0 Has Hypertension:  No   2 Age:  32   1 Has Diabetes:  Yes   0 Had Stroke:  No Had TIA:  No Had thromboembolism:  No   1 Female:  Yes          Past Medical History:  Diagnosis Date  . Atrial fibrillation ([place])   . CAD (coronary artery disease)   . Cancer ([place])    skin  . CHF (congestive heart failure) (H[facility])     A-Fib  . COPD (chronic obstructive pulmonary disease) ([place])   . Diabetes mellitus without complication ([place])   . GERD (gastroesophageal reflux disease)   . Hyperlipidemia   . Hypocalcemia   . Hypokalemia   . Macular degeneration   . Myocardial infarction   . 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 Sinus node dysfunction ([facility])   . Thyroid disease   . Vitamin B12 deficiency   . Vitamin D deficiency       Surgical  History:  Past Surgical History:  Procedure Laterality Date  . CHOLECYSTECTOMY    . COLONOSCOPY  2015   1 polyp removed- repeat 3 years  . CORONARY ANGIOPLASTY WITH STENT PLACEMENT    . EP IMPLANTABLE DEVICE N/A 11/10/2015   Procedure: Pacemaker Implant;  Surgeon: Will [name], MD;  Location: [facility];  Service: Cardiovascular;  Laterality: N/A;  . EP IMPLANTABLE DEVICE N/A 11/11/2015   Procedure: Lead Revision/Repair;  Surgeon: Evans Lance, MD;  Location: [facility];  Service: Cardiovascular;  Laterality: N/A;  . parathyroid surgery     x 2  . THYROID SURGERY    . TOTAL SHOULDER ARTHROPLASTY Right    x 2  . VAGINAL HYSTERECTOMY       Home Meds: Prior to Admission medications   Medication Sig Start Date End Date Taking? Authorizing Provider  albuterol (PROVENTIL HFA;VENTOLIN HFA) 108 (90 Base) MCG/ACT inhaler Inhale 2 puffs into the lungs every 6 (six) hours as needed for wheezing or shortness of breath.   Yes Historical Provider, MD  ARTIFICIAL TEAR OP Place 1 drop into both eyes as needed (for dry eyes).   Yes Historical Provider, MD  Cholecalciferol (VITAMIN D3) 5000 units TABS Take 1 tablet by mouth every morning.  Yes Historical Provider, MD  furosemide (LASIX) 20 MG tablet Take 60 mg by mouth every morning.   Yes Historical Provider, MD  isosorbide mononitrate (IMDUR) 30 MG 24 hr tablet Take 1 tablet (30 mg total) by mouth daily. 02/11/16  Yes Will [name], MD  levothyroxine (SYNTHROID, LEVOTHROID) 150 MCG tablet Take 1 tablet (150 mcg total) by mouth daily. 12/08/15  Yes Glean Hess, MD  Melatonin 5 MG TABS Take 1 tablet by mouth at bedtime.   Yes Historical Provider, MD  metFORMIN (GLUCOPHAGE) 500 MG tablet Take 1 tablet (500 mg total) by mouth 2 (two) times daily with a meal. 09/16/15  Yes Adline Potter, MD  metoprolol succinate (TOPROL XL) 25 MG 24 hr tablet Take 1 tablet (25 mg total) by mouth daily. 02/11/16  Yes Will [name], MD    nitroGLYCERIN (NITROSTAT) 0.4 MG SL tablet Place 0.4 mg under the tongue every 5 (five) minutes as needed for chest pain.   Yes Historical Provider, MD  pantoprazole (PROTONIX) 40 MG tablet Take 1 tablet (40 mg total) by mouth daily. 10/16/15  Yes Juline Patch, MD  potassium chloride (KLOR-CON) 8 MEQ tablet Take 1 tablet (8 mEq total) by mouth 2 (two) times daily. 12/21/15  Yes Glean Hess, MD  rosuvastatin (CRESTOR) 40 MG tablet Take 1 tablet (40 mg total) by mouth daily. 09/18/15  Yes Minna Merritts, MD  sitaGLIPtin (JANUVIA) 50 MG tablet Take 50 mg by mouth daily.   Yes Historical Provider, MD  vitamin B-12 (CYANOCOBALAMIN) 500 MCG tablet Take 500 mcg by mouth daily.   Yes Historical Provider, MD  warfarin (COUMADIN) 7.5 MG tablet Take 1 tablet (7.5 mg total) by mouth daily. 01/20/16  Yes Minna Merritts, MD    Allergies:  Allergies  Allergen Reactions  . Demerol [Meperidine] Anaphylaxis    Tolerated Fentanyl 11/10/15  . Sulfa Antibiotics Anaphylaxis  . Tetracyclines & Related Other (See Comments)    Made nose, lips,  And tongue itchy    Social History   Social History  . Marital status: Married    Spouse name: N/A  . Number of children: N/A  . Years of education: N/A   Occupational History  . Retired    Social History Main Topics  . Smoking status: Never Smoker  . Smokeless tobacco: Never Used  . Alcohol use 0.0 oz/week  . Drug use: No  . Sexual activity: Not on file   Other Topics Concern  . Not on file   Social History Narrative   Lives in [place] w/ husband.     Family History  Problem Relation Age of Onset  . Thyroid disease Mother 108    3 days after surgery  . Heart defect Father 39    ?ascending aortic aneurysm     ROS:  Please see the history of present illness.     All other systems reviewed and negative.    Physical Exam:  Blood pressure 110/70, pulse 70, height 5\' 3"  (1.6 m), weight 210 lb (95.3 kg). General: Well developed, well  nourished female in no acute distress. Head: Normocephalic, atraumatic, sclera non-icteric, no xanthomas, nares are without discharge. EENT: normal  Lymph Nodes:  none Neck: Negative for carotid bruits. JVD not elevated. Back:without scoliosis kyphosis   Lungs: Clear bilaterally to auscultation without wheezes, rales, or rhonchi. Breathing is unlabored. Heart: RRR with S1 S2. No murmur . No rubs, or gallops appreciated. Abdomen: Soft, non-tender, non-distended with normoactive bowel sounds. No  hepatomegaly. No rebound/guarding. No obvious abdominal masses. Msk:  Strength and tone appear normal for age. Extremities: No clubbing or cyanosis. No  edema.  Distal pedal pulses are 2+ and equal bilaterally. Skin: Warm and Dry Neuro: Alert and oriented X 3. CN III-XII intact Grossly normal sensory and motor function . Psych:  Responds to questions appropriately with a normal affect.      Labs: Cardiac Enzymes No results for input(s): CKTOTAL, CKMB, TROPONINI in the last 72 hours. CBC Lab Results  Component Value Date   WBC 8.5 11/12/2015   HGB 12.7 11/12/2015   HCT 39.1 11/12/2015   MCV 88.9 11/12/2015   PLT 154 11/12/2015   PROTIME: No results for input(s): LABPROT, INR in the last 72 hours. Chemistry No results for input(s): NA, K, CL, CO2, BUN, CREATININE, CALCIUM, PROT, BILITOT, ALKPHOS, ALT, AST, GLUCOSE in the last 168 hours.  Invalid input(s): LABALBU Lipids Lab Results  Component Value Date   CHOL 200 (H) 09/09/2015   HDL 44 09/09/2015   LDLCALC Comment 09/09/2015   TRIG 418 (H) 09/09/2015   BNP No results found for: PROBNP Thyroid Function Tests: No results for input(s): TSH, T4TOTAL, T3FREE, THYROIDAB in the last 72 hours.  Invalid input(s): FREET3 Miscellaneous No results found for: DDIMER  Radiology/Studies:  No results found.  EKG: Atrial fibrillation with intermittent ventricular pacing Striking T-wave abnormalities inferolaterally present on ECG  11/30   Assessment and Plan:  Sick Sinus syndrome  Persistent Atrial Fib with prior PVI  Pacemaker [employer]   Abnormal ECG  P-wave oversensing resulting false detection of atrial fibrillation  HFpEF  Coronary artery disease with prior stenting  Hypotension-relative  Renal insufficiency-worsening grade 4      The patient has ongoing issues with dyspnea on exertion. She has problems with volume overload which she addresses with self adjusted diuretics. Today she is euvolemic.  It is her impression that her dyspnea improved on dronaderone although it did not have a (on her measured atrial fibrillation permanent, appreciating that this was not accurate because of atrial oversensing.  We have reprogrammed her device by examining PVAB in the hopes of trying to improve specificity of atrial sensing  We will try and correlate symptoms with the presence of atrial fibrillation. Her heart rate is irregular in atrial fibrillation as opposed to sinus rhythm. She will use both a palpation along as well as are comparing it to her atrial fibrillation events. We have discussed antiarrhythmic drugs as an option. She is not a candidate for a 1C given her coronary disease. She would be a candidate for dofetilide or amiodarone, the risk of the former having to be determined if we elect to use it in the context of her worsening renal insufficiency.  Given her diabetes, in the context of the ACCORD TRIAL, I have taken the liberty of discontinuing her nitrates and her beta blocker; perhaps this will also improve her renal function  We will have to be careful with diuretics as relates to her renal issues.  On Anticoagulation;  No bleeding issues     Dawn Garcia

## 2016-04-19 NOTE — Patient Instructions (Signed)
Medication Instructions: - Your physician has recommended you make the following change in your medication:  1) Stop toprol (metoprolol) 2) Stop imdur (isosorbide)  Labwork: - none ordered  Procedures/Testing: - none ordered  Follow-Up: - Your physician recommends that you schedule a follow-up appointment in: 2 months with Dr. Caryl Comes.   Any Additional Special Instructions Will Be Listed Below (If Applicable).     If you need a refill on your cardiac medications before your next appointment, please call your pharmacy.

## 2016-04-20 ENCOUNTER — Other Ambulatory Visit: Payer: Self-pay | Admitting: Internal Medicine

## 2016-04-20 ENCOUNTER — Telehealth: Payer: Self-pay | Admitting: Internal Medicine

## 2016-04-20 LAB — BASIC METABOLIC PANEL
BUN/Creatinine Ratio: 17 (ref 12–28)
BUN: 16 mg/dL (ref 8–27)
CO2: 21 mmol/L (ref 18–29)
CREATININE: 0.92 mg/dL (ref 0.57–1.00)
Calcium: 9.1 mg/dL (ref 8.7–10.3)
Chloride: 102 mmol/L (ref 96–106)
GFR, EST AFRICAN AMERICAN: 70 mL/min/{1.73_m2} (ref >59)
GFR, EST NON AFRICAN AMERICAN: 61 mL/min/{1.73_m2} (ref >59)
Glucose: 227 mg/dL — ABNORMAL HIGH (ref 65–99)
Potassium: 3.8 mmol/L (ref 3.5–5.2)
Sodium: 142 mmol/L (ref 134–144)

## 2016-04-20 LAB — LIPID PANEL
CHOL/HDL RATIO: 3 ratio (ref 0.0–4.4)
Cholesterol, Total: 127 mg/dL (ref 100–199)
HDL: 42 mg/dL (ref >39)
LDL CALC: 43 mg/dL (ref 0–99)
TRIGLYCERIDES: 209 mg/dL — AB (ref 0–149)
VLDL CHOLESTEROL CAL: 42 mg/dL — AB (ref 5–40)

## 2016-04-20 MED ORDER — METFORMIN HCL 500 MG PO TABS: 500.0000 mg | ORAL_TABLET | Freq: Every day | ORAL | 1 refills | Status: DC

## 2016-04-20 NOTE — Telephone Encounter (Signed)
Pt called c/o ongoing diarrhea.  It is improved since stopping Multaq.  She is on metformin 500 mg bid and Januvia 50 mg daily.  She is instructed to reduce dose of metformin to once a day and will recheck on follow up.

## 2016-04-20 NOTE — Telephone Encounter (Signed)
Pt called need refill on Rx metformin send to [facility]

## 2016-04-20 NOTE — Telephone Encounter (Signed)
Pt called need refill metformin send to [facility]--

## 2016-04-21 ENCOUNTER — Other Ambulatory Visit: Payer: Self-pay | Admitting: Family Medicine

## 2016-04-30 ENCOUNTER — Other Ambulatory Visit: Payer: Self-pay | Admitting: Internal Medicine

## 2016-05-02 ENCOUNTER — Ambulatory Visit (INDEPENDENT_AMBULATORY_CARE_PROVIDER_SITE_OTHER): Payer: Medicare Other | Admitting: Internal Medicine

## 2016-05-02 ENCOUNTER — Encounter: Payer: Self-pay | Admitting: Internal Medicine

## 2016-05-02 VITALS — BP 132/80 | HR 70 | Ht 63.0 in | Wt 210.0 lb

## 2016-05-02 DIAGNOSIS — E1121 Type 2 diabetes mellitus with diabetic nephropathy: Secondary | ICD-10-CM | POA: Diagnosis not present

## 2016-05-02 DIAGNOSIS — I480 Paroxysmal atrial fibrillation: Secondary | ICD-10-CM

## 2016-05-02 DIAGNOSIS — M7061 Trochanteric bursitis, right hip: Secondary | ICD-10-CM | POA: Diagnosis not present

## 2016-05-02 DIAGNOSIS — I5032 Chronic diastolic (congestive) heart failure: Secondary | ICD-10-CM | POA: Diagnosis not present

## 2016-05-02 DIAGNOSIS — M7062 Trochanteric bursitis, left hip: Secondary | ICD-10-CM

## 2016-05-02 DIAGNOSIS — I2510 Atherosclerotic heart disease of native coronary artery without angina pectoris: Secondary | ICD-10-CM | POA: Diagnosis not present

## 2016-05-02 DIAGNOSIS — Z9009 Acquired absence of other part of head and neck: Secondary | ICD-10-CM

## 2016-05-02 DIAGNOSIS — E892 Postprocedural hypoparathyroidism: Secondary | ICD-10-CM

## 2016-05-02 DIAGNOSIS — E890 Postprocedural hypothyroidism: Secondary | ICD-10-CM | POA: Diagnosis not present

## 2016-05-02 DIAGNOSIS — Z0000 Encounter for general adult medical examination without abnormal findings: Secondary | ICD-10-CM | POA: Diagnosis not present

## 2016-05-02 DIAGNOSIS — J432 Centrilobular emphysema: Secondary | ICD-10-CM

## 2016-05-02 LAB — POCT URINALYSIS DIPSTICK
Bilirubin, UA: NEGATIVE
Blood, UA: NEGATIVE
GLUCOSE UA: NEGATIVE
Ketones, UA: NEGATIVE
LEUKOCYTES UA: NEGATIVE
NITRITE UA: NEGATIVE
Protein, UA: NEGATIVE
Spec Grav, UA: <=1.005
UROBILINOGEN UA: 0.2
pH, UA: 5

## 2016-05-02 NOTE — Patient Instructions (Signed)
Health Maintenance  Topic Date Due  . DEXA SCAN  03/12/2006  . TETANUS/TDAP  03/14/2016  . URINE MICROALBUMIN  08/18/2016  . HEMOGLOBIN A1C  09/08/2016  . FOOT EXAM  05/02/2017  . COLONOSCOPY  08/12/2024  . INFLUENZA VACCINE  Completed  . PNA vac Low Risk Adult  Completed  . MAMMOGRAM  Excluded  . OPHTHALMOLOGY EXAM  Excluded

## 2016-05-02 NOTE — Progress Notes (Signed)
Patient: Dawn Garcia, Female    DOB: 1940-11-01, 76 y.o.   MRN: [id number] Visit Date: 05/02/2016  Today's Provider: Halina Maidens, MD   Chief Complaint  Patient presents with  . Medicare Wellness   Subjective:    Annual wellness visit Dawn Garcia is a 76 y.o. female who presents today for her Subsequent Annual Wellness Visit. She feels fairly well. She reports exercising very little due to hip pain.  She can ride a recumbant bike. She reports she is sleeping fairly well. She denies breast problems - does her own exam but is no longer getting mammograms.  ----------------------------------------------------------- Diabetes  She presents for her follow-up diabetic visit. She has type 2 diabetes mellitus. Her disease course has been worsening (glucoses are higher in general - sometimes over 200.). Pertinent negatives for hypoglycemia include no dizziness or headaches. Pertinent negatives for diabetes include no chest pain, no fatigue, no polydipsia and no polyuria. (Diarrhea improved with lower dose of metformin) Diabetic complications include heart disease and nephropathy. Current diabetic treatment includes oral agent (monotherapy). She is compliant with treatment all of the time. Her weight is stable. There is no change in her home blood glucose trend. Her breakfast blood glucose is taken between 6-7 am. Her breakfast blood glucose range is generally 140-180 mg/dl.  Hypertension  This is a chronic problem. The problem is unchanged. The problem is controlled. Associated symptoms include palpitations and shortness of breath. Pertinent negatives include no chest pain or headaches. (Doing well on current regimen) Past treatments include beta blockers and diuretics. The current treatment provides moderate improvement. There are no compliance problems.  Hypertensive end-organ damage includes CAD/MI. followed by Cardiology for Afib and diastolic dysfunction.  Hip Pain   There was no  injury mechanism. The pain is present in the left hip and right hip. The quality of the pain is described as aching and cramping. The pain is mild. The pain has been fluctuating since onset. Pertinent negatives include no numbness or tingling. The symptoms are aggravated by weight bearing and palpation. She has tried heat for the symptoms.  Afib - in paroxismal Afib.  Monitored by cardiology with poct INR.  No bleeding issues.  She has to stop Multaq due to severe diarrhea. 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  Not using any nsaids or tylenol.  Has not tried topical rubs.  No swollen or red joints.  No triggering. Renal insuff - latest labs showed normalization of CR after cutting januvia in half and stopping Allopurinol. COPD - mild stable sx.  SOB mainly due to CAD.  Using inhaler as needed.   Not requiring a maintenance medication.  Lab Results  Component Value Date   CHOL 127 04/19/2016   HDL 42 04/19/2016   LDLCALC 43 04/19/2016   TRIG 209 (H) 04/19/2016   CHOLHDL 3.0 04/19/2016     Chemistry      Component Value Date/Time   NA 142 04/19/2016 0857   K 3.8 04/19/2016 0857   CL 102 04/19/2016 0857   CO2 21 04/19/2016 0857   BUN 16 04/19/2016 0857   CREATININE 0.92 04/19/2016 0857      Component Value Date/Time   CALCIUM 9.1 04/19/2016 0857   ALKPHOS 168 (H) 08/19/2015 1619   AST 25 08/19/2015 1619   ALT 21 08/19/2015 1619   BILITOT 0.3 08/19/2015 1619     Lab Results  Component Value Date   ALT 21 08/19/2015   Lab Results  Component Value Date  TSH 0.034 (L) 03/10/2016   Lab Results  Component Value Date   HGBA1C 7.5 (H) 03/10/2016   Lab Results  Component Value Date   CALCIUM 9.1 04/19/2016     Review of Systems  Constitutional: Negative for appetite change, fatigue, fever and unexpected weight change.  HENT: Negative for hearing loss, sore throat and trouble swallowing.   Eyes: Negative for visual disturbance.  Respiratory: Positive for shortness of  breath. Negative for cough, chest tightness and wheezing.   Cardiovascular: Positive for palpitations. Negative for chest pain and leg swelling.  Gastrointestinal: Negative for abdominal pain, blood in stool, constipation and diarrhea.  Endocrine: Negative for polydipsia and polyuria.  Genitourinary: Negative for dysuria, frequency and hematuria.  Musculoskeletal: Positive for arthralgias, gait problem and myalgias. Negative for joint swelling.  Skin: Negative for color change and rash.  Neurological: Negative for dizziness, tingling, numbness and headaches.  Psychiatric/Behavioral: Negative for dysphoric mood and sleep disturbance.    Social History   Social History  . Marital status: Married    Spouse name: N/A  . Number of children: N/A  . Years of education: N/A   Occupational History  . Retired    Social History Main Topics  . Smoking status: Never Smoker  . Smokeless tobacco: Never Used  . Alcohol use 0.0 oz/week  . Drug use: No  . Sexual activity: Not on file   Other Topics Concern  . Not on file   Social History Narrative   Lives in [place] w/ husband.    Patient Active Problem List   Diagnosis Date Noted  . Hyperlipidemia 09/18/2015  . Symptomatic bradycardia 09/18/2015  . Coronary artery disease 09/10/2015  . B12 deficiency 09/10/2015  . History of melanoma 09/09/2015  . COPD (chronic obstructive pulmonary disease) ([place]) 09/09/2015  . Hypothyroidism following radioiodine therapy 09/09/2015  . Encounter for anticoagulation discussion and counseling 08/26/2015  . Obesity, Class II, BMI 35-39.9 08/20/2015  . GERD with stricture 08/20/2015  . Macular degeneration 08/20/2015  . Diastolic CHF ([place]) 123456  . History of parathyroidectomy ([place]) 08/20/2015  . CKD (chronic kidney disease) stage 3, GFR 30-59 ml/min 08/20/2015  . PAF (paroxysmal atrial fibrillation) ([place]) 08/19/2015  . Controlled type 2 diabetes mellitus with diabetic nephropathy, without  long-term current use of insulin ([place]) 08/19/2015  . Primary gout 08/19/2015    Past Surgical History:  Procedure Laterality Date  . CHOLECYSTECTOMY    . COLONOSCOPY  2015   1 polyp removed- repeat 3 years  . CORONARY ANGIOPLASTY WITH STENT PLACEMENT    . EP IMPLANTABLE DEVICE N/A 11/10/2015   Procedure: Pacemaker Implant;  Surgeon: Will [name], MD;  Location: [facility];  Service: Cardiovascular;  Laterality: N/A;  . EP IMPLANTABLE DEVICE N/A 11/11/2015   Procedure: Lead Revision/Repair;  Surgeon: Evans Lance, MD;  Location: [facility];  Service: Cardiovascular;  Laterality: N/A;  . parathyroid surgery     x 2  . THYROID SURGERY    . TOTAL SHOULDER ARTHROPLASTY Right    x 2  . VAGINAL HYSTERECTOMY      Her family history includes Heart defect (age of onset: 32) in her father; Thyroid disease (age of onset: 50) in her mother.     Previous Medications   ALBUTEROL (PROVENTIL HFA;VENTOLIN HFA) 108 (90 BASE) MCG/ACT INHALER    Inhale 2 puffs into the lungs every 6 (six) hours as needed for wheezing or shortness of breath.   ARTIFICIAL TEAR OP  Place 1 drop into both eyes as needed (for dry eyes).   CHOLECALCIFEROL (VITAMIN D3) 5000 UNITS TABS    Take 1 tablet by mouth every morning.   FUROSEMIDE (LASIX) 20 MG TABLET    Take 60 mg by mouth every morning.   LEVOTHYROXINE (SYNTHROID, LEVOTHROID) 150 MCG TABLET    Take 1 tablet (150 mcg total) by mouth daily.   MELATONIN 5 MG TABS    Take 1 tablet by mouth at bedtime.   METFORMIN (GLUCOPHAGE) 500 MG TABLET    Take 1 tablet (500 mg total) by mouth daily with breakfast.   METOPROLOL SUCCINATE (TOPROL-XL) 25 MG 24 HR TABLET    Take 1 tablet by mouth daily.   NITROGLYCERIN (NITROSTAT) 0.4 MG SL TABLET    Place 0.4 mg under the tongue every 5 (five) minutes as needed for chest pain.   PANTOPRAZOLE (PROTONIX) 40 MG TABLET    TAKE ONE TABLET BY MOUTH ONCE DAILY   POTASSIUM CHLORIDE (KLOR-CON) 8 MEQ TABLET    Take 1  tablet (8 mEq total) by mouth 2 (two) times daily.   ROSUVASTATIN (CRESTOR) 40 MG TABLET    Take 1 tablet (40 mg total) by mouth daily.   SITAGLIPTIN (JANUVIA) 50 MG TABLET    Take 50 mg by mouth daily.   VITAMIN B-12 (CYANOCOBALAMIN) 500 MCG TABLET    Take 500 mcg by mouth daily.   WARFARIN (COUMADIN) 7.5 MG TABLET    Take 1 tablet (7.5 mg total) by mouth daily.    Patient Care Team: Glean Hess, MD as PCP - General (Internal Medicine) Deboraha Sprang, MD as Consulting Physician (Cardiology) Minna Merritts, MD as Consulting Physician (Cardiology) Erby Pian, MD as Referring Physician (Pulmonary Disease)      Objective:   Vitals: BP 132/80   Pulse 70   Ht 5\' 3"  (1.6 m)   Wt 210 lb (95.3 kg)   SpO2 97%   BMI 37.20 kg/m   Physical Exam  Constitutional: She is oriented to person, place, and time. She appears well-developed and well-nourished. No distress.  HENT:  Head: Normocephalic and atraumatic.  Right Ear: Tympanic membrane and ear canal normal.  Left Ear: Tympanic membrane and ear canal normal.  Nose: Right sinus exhibits no maxillary sinus tenderness. Left sinus exhibits no maxillary sinus tenderness.  Mouth/Throat: Uvula is midline and oropharynx is clear and moist.  Eyes: Conjunctivae and EOM are normal. Right eye exhibits no discharge. Left eye exhibits no discharge. No scleral icterus.  Neck: Normal range of motion. Carotid bruit is not present. No erythema present. No thyromegaly present.  Cardiovascular: Normal rate, regular rhythm, normal heart sounds and normal pulses.   Pulmonary/Chest: Effort normal. No respiratory distress. She has no wheezes. Right breast exhibits no mass, no nipple discharge, no skin change and no tenderness. Left breast exhibits no mass, no nipple discharge, no skin change and no tenderness.  Abdominal: Soft. Bowel sounds are normal. There is no hepatosplenomegaly. There is tenderness in the periumbilical area. There is no CVA  tenderness.  Midline tenderness over ventral hernia - no evidence of incarceration or mass  Musculoskeletal: She exhibits no edema.  Tender over both hips ROM both hips good OA changed in fingers of both hands  Lymphadenopathy:    She has no cervical adenopathy.    She has no axillary adenopathy.  Neurological: She is alert and oriented to person, place, and time. She has normal strength and normal reflexes. No cranial nerve  deficit or sensory deficit.  Reflex Scores:      Patellar reflexes are 2+ on the right side and 2+ on the left side. Skin: Skin is warm, dry and intact. No rash noted.  Psychiatric: She has a normal mood and affect. Her speech is normal and behavior is normal. Thought content normal. Cognition and memory are normal.  Nursing note and vitals reviewed.   Activities of Daily Living In your present state of health, do you have any difficulty performing the following activities: 05/02/2016 12/08/2015  Hearing? N N  Vision? N N  Difficulty concentrating or making decisions? N N  Walking or climbing stairs? N N  Dressing or bathing? N N  Doing errands, shopping? N N  Preparing Food and eating ? N -  Using the Toilet? N -  In the past six months, have you accidently leaked urine? N -  Do you have problems with loss of bowel control? N -  Managing your Medications? N -  Managing your Finances? N -  Housekeeping or managing your Housekeeping? N -    Fall Risk Assessment Fall Risk  05/02/2016 12/08/2015 09/09/2015  Falls in the past year? No No No      Depression Screen PHQ 2/9 Scores 05/02/2016 12/08/2015 09/09/2015  PHQ - 2 Score 0 0 0   6CIT Screen 05/02/2016  What Year? 0 points  What month? 0 points  What time? 0 points  Count back from 20 0 points  Months in reverse 0 points  Repeat phrase 0 points  Total Score 0     Medicare Annual Wellness Visit Summary:  Reviewed patient's Family Medical History Reviewed and updated list of patient's medical  providers Assessment of cognitive impairment was done Assessed patient's functional ability Established a written schedule for health screening [place] Completed and Reviewed  Exercise Activities and Dietary recommendations Goals    None      Immunization History  Administered Date(s) Administered  . Influenza,inj,Quad PF,36+ Mos 12/08/2015  . Pneumococcal Polysaccharide-23 08/19/2015    Health Maintenance  Topic Date Due  . FOOT EXAM  03/13/1951  . DEXA SCAN  03/12/2006  . TETANUS/TDAP  03/14/2016  . URINE MICROALBUMIN  08/18/2016  . HEMOGLOBIN A1C  09/08/2016  . COLONOSCOPY  08/12/2024  . INFLUENZA VACCINE  Completed  . PNA vac Low Risk Adult  Completed  . MAMMOGRAM  Excluded  . OPHTHALMOLOGY EXAM  Excluded    Discussed health benefits of physical activity, and encouraged her to engage in regular exercise appropriate for her age and condition.    ------------------------------------------------------------------------------------------------------------  Assessment & Plan:  1. Medicare annual wellness visit, subsequent Measures satisfied  2. PAF (paroxysmal atrial fibrillation) (HCC) In SR now - continue current medication, warfarin and cardiology follow up  3. Controlled type 2 diabetes mellitus with diabetic nephropathy, without long-term current use of insulin (HCC) Will add low dose amaryl 1 mg if needed Renal function much improved - Hemoglobin A1c  4. Centrilobular emphysema (HCC) stable  5. History of parathyroidectomy ([place]) Recent calcium normal  6. Chronic diastolic congestive heart failure (HCC) Followed by cardiology - exercise limited by SOB on exertion  Lipid control is excellent on Crestor  7. Hypothyroidism following radioiodine therapy supplemented  8. Trochanteric bursitis of both hips Try heat and topical rubs If needed, can refer to [place], MD [facility]  05/02/2016

## 2016-05-03 ENCOUNTER — Other Ambulatory Visit: Payer: Self-pay | Admitting: Internal Medicine

## 2016-05-03 LAB — HEMOGLOBIN A1C
ESTIMATED AVERAGE GLUCOSE: 189 mg/dL
HEMOGLOBIN A1C: 8.2 % — AB (ref 4.8–5.6)

## 2016-05-03 MED ORDER — GLIMEPIRIDE 1 MG PO TABS: 1.0000 mg | ORAL_TABLET | Freq: Every day | ORAL | 1 refills | Status: DC

## 2016-05-04 ENCOUNTER — Ambulatory Visit (INDEPENDENT_AMBULATORY_CARE_PROVIDER_SITE_OTHER): Payer: Medicare Other

## 2016-05-04 DIAGNOSIS — I4891 Unspecified atrial fibrillation: Secondary | ICD-10-CM | POA: Diagnosis not present

## 2016-05-04 DIAGNOSIS — Z7189 Other specified counseling: Secondary | ICD-10-CM | POA: Diagnosis not present

## 2016-05-04 DIAGNOSIS — I480 Paroxysmal atrial fibrillation: Secondary | ICD-10-CM | POA: Diagnosis not present

## 2016-05-04 DIAGNOSIS — Z7901 Long term (current) use of anticoagulants: Secondary | ICD-10-CM

## 2016-05-04 DIAGNOSIS — I2510 Atherosclerotic heart disease of native coronary artery without angina pectoris: Secondary | ICD-10-CM

## 2016-05-04 LAB — POCT INR: INR: 1.8

## 2016-05-05 ENCOUNTER — Telehealth: Payer: Self-pay

## 2016-05-05 NOTE — Telephone Encounter (Signed)
Spoke with patient regarding WC recommendations for CXray d/t high RV thresholds. Pt. Reports that she switched care to Dr. Caryl Comes for his [place] location. I advised her that I would review this with Dr. Caryl Comes and call her back with additional recommendations.

## 2016-05-06 NOTE — Telephone Encounter (Signed)
Follow Up   Pt states she did not get a return phone call and feels this is not a good way to treat a pt. States she would like to know something.

## 2016-05-09 NOTE — Telephone Encounter (Signed)
Returned patient call and explained that we currently do not need to obtain an xray. I explained that she had been seen by Dr. Caryl Comes since the transmission that had been reviewed by Dr. Curt Bears and that at that check there was no need to pursue additional follow up. I explained that we would continue to monitor her via her home check scheduled for 3/1 and that we would see her in clinic in April with Dr. Caryl Comes. Pt. Verbalized understanding and denied any further questions.

## 2016-05-12 ENCOUNTER — Ambulatory Visit (INDEPENDENT_AMBULATORY_CARE_PROVIDER_SITE_OTHER): Payer: Medicare Other | Admitting: *Deleted

## 2016-05-12 DIAGNOSIS — I495 Sick sinus syndrome: Secondary | ICD-10-CM | POA: Diagnosis not present

## 2016-05-12 NOTE — Progress Notes (Signed)
Remote pacemaker transmission.   

## 2016-05-17 ENCOUNTER — Encounter: Payer: Self-pay | Admitting: Cardiology

## 2016-05-17 LAB — CUP PACEART REMOTE DEVICE CHECK
Battery Remaining Longevity: 107 mo
Battery Remaining Percentage: 95.5 %
Battery Voltage: 3.02 V
Brady Statistic AP VS Percent: 1 %
Brady Statistic RA Percent Paced: 51 %
Date Time Interrogation Session: 20180301092851
Implantable Lead Implant Date: 20170829
Implantable Lead Location: 753859
Implantable Pulse Generator Implant Date: 20170829
Lead Channel Impedance Value: 450 Ohm
Lead Channel Pacing Threshold Amplitude: 1 V
Lead Channel Pacing Threshold Pulse Width: 0.4 ms
Lead Channel Sensing Intrinsic Amplitude: 3.8 mV
Lead Channel Setting Pacing Amplitude: 2 V
MDC IDC LEAD IMPLANT DT: 20170829
MDC IDC LEAD LOCATION: 753860
MDC IDC MSMT LEADCHNL RV IMPEDANCE VALUE: 590 Ohm
MDC IDC MSMT LEADCHNL RV PACING THRESHOLD AMPLITUDE: 0.75 V
MDC IDC MSMT LEADCHNL RV PACING THRESHOLD PULSEWIDTH: 0.4 ms
MDC IDC MSMT LEADCHNL RV SENSING INTR AMPL: 12 mV
MDC IDC SET LEADCHNL RV PACING AMPLITUDE: 2.5 V
MDC IDC SET LEADCHNL RV PACING PULSEWIDTH: 0.4 ms
MDC IDC SET LEADCHNL RV SENSING SENSITIVITY: 2 mV
MDC IDC STAT BRADY AP VP PERCENT: 64 %
MDC IDC STAT BRADY AS VP PERCENT: 36 %
MDC IDC STAT BRADY AS VS PERCENT: 1 %
MDC IDC STAT BRADY RV PERCENT PACED: 93 %
Pulse Gen Model: 2272
Pulse Gen Serial Number: 7945290

## 2016-05-18 ENCOUNTER — Ambulatory Visit (INDEPENDENT_AMBULATORY_CARE_PROVIDER_SITE_OTHER): Payer: Medicare Other

## 2016-05-18 DIAGNOSIS — I480 Paroxysmal atrial fibrillation: Secondary | ICD-10-CM

## 2016-05-18 DIAGNOSIS — I2510 Atherosclerotic heart disease of native coronary artery without angina pectoris: Secondary | ICD-10-CM

## 2016-05-18 DIAGNOSIS — Z7189 Other specified counseling: Secondary | ICD-10-CM | POA: Diagnosis not present

## 2016-05-18 DIAGNOSIS — Z7901 Long term (current) use of anticoagulants: Secondary | ICD-10-CM

## 2016-05-18 DIAGNOSIS — I4891 Unspecified atrial fibrillation: Secondary | ICD-10-CM | POA: Diagnosis not present

## 2016-05-18 LAB — POCT INR: INR: 2

## 2016-05-31 LAB — CUP PACEART INCLINIC DEVICE CHECK
Battery Voltage: 3.02 V
Brady Statistic RA Percent Paced: 77 %
Brady Statistic RV Percent Paced: 98 %
Date Time Interrogation Session: 20180206135545
Implantable Lead Location: 753859
Implantable Lead Location: 753860
Lead Channel Setting Pacing Pulse Width: 0.4 ms
MDC IDC LEAD IMPLANT DT: 20170829
MDC IDC LEAD IMPLANT DT: 20170829
MDC IDC MSMT LEADCHNL RA IMPEDANCE VALUE: 450 Ohm
MDC IDC MSMT LEADCHNL RV IMPEDANCE VALUE: 587.5 Ohm
MDC IDC PG IMPLANT DT: 20170829
MDC IDC PG SERIAL: 7945290
MDC IDC SET LEADCHNL RA PACING AMPLITUDE: 2 V
MDC IDC SET LEADCHNL RV PACING AMPLITUDE: 2.5 V
MDC IDC SET LEADCHNL RV SENSING SENSITIVITY: 2 mV
Pulse Gen Model: 2272

## 2016-06-01 ENCOUNTER — Encounter: Payer: Self-pay | Admitting: Cardiology

## 2016-06-02 DIAGNOSIS — H35363 Drusen (degenerative) of macula, bilateral: Secondary | ICD-10-CM | POA: Diagnosis not present

## 2016-06-02 DIAGNOSIS — H353112 Nonexudative age-related macular degeneration, right eye, intermediate dry stage: Secondary | ICD-10-CM | POA: Diagnosis not present

## 2016-06-02 DIAGNOSIS — H353221 Exudative age-related macular degeneration, left eye, with active choroidal neovascularization: Secondary | ICD-10-CM | POA: Diagnosis not present

## 2016-06-02 DIAGNOSIS — H43813 Vitreous degeneration, bilateral: Secondary | ICD-10-CM | POA: Diagnosis not present

## 2016-06-15 ENCOUNTER — Ambulatory Visit (INDEPENDENT_AMBULATORY_CARE_PROVIDER_SITE_OTHER): Payer: Medicare Other

## 2016-06-15 ENCOUNTER — Other Ambulatory Visit: Payer: Self-pay | Admitting: Cardiovascular Disease

## 2016-06-15 DIAGNOSIS — Z7189 Other specified counseling: Secondary | ICD-10-CM | POA: Diagnosis not present

## 2016-06-15 DIAGNOSIS — I4891 Unspecified atrial fibrillation: Secondary | ICD-10-CM | POA: Diagnosis not present

## 2016-06-15 DIAGNOSIS — Z7901 Long term (current) use of anticoagulants: Secondary | ICD-10-CM | POA: Diagnosis not present

## 2016-06-15 DIAGNOSIS — I2510 Atherosclerotic heart disease of native coronary artery without angina pectoris: Secondary | ICD-10-CM

## 2016-06-15 DIAGNOSIS — I480 Paroxysmal atrial fibrillation: Secondary | ICD-10-CM

## 2016-06-15 LAB — POCT INR: INR: 2.4

## 2016-06-15 MED ORDER — ROSUVASTATIN CALCIUM 40 MG PO TABS: 40.0000 mg | ORAL_TABLET | Freq: Every day | ORAL | 2 refills | Status: DC

## 2016-06-21 ENCOUNTER — Ambulatory Visit (INDEPENDENT_AMBULATORY_CARE_PROVIDER_SITE_OTHER): Payer: Medicare Other | Admitting: Internal Medicine

## 2016-06-21 ENCOUNTER — Encounter: Payer: Self-pay | Admitting: Internal Medicine

## 2016-06-21 VITALS — BP 120/84 | HR 60 | Ht 63.0 in | Wt 216.2 lb

## 2016-06-21 DIAGNOSIS — I495 Sick sinus syndrome: Secondary | ICD-10-CM

## 2016-06-21 DIAGNOSIS — I2510 Atherosclerotic heart disease of native coronary artery without angina pectoris: Secondary | ICD-10-CM

## 2016-06-21 DIAGNOSIS — I481 Persistent atrial fibrillation: Secondary | ICD-10-CM | POA: Diagnosis not present

## 2016-06-21 DIAGNOSIS — Z950 Presence of cardiac pacemaker: Secondary | ICD-10-CM | POA: Diagnosis not present

## 2016-06-21 DIAGNOSIS — I5030 Unspecified diastolic (congestive) heart failure: Secondary | ICD-10-CM | POA: Diagnosis not present

## 2016-06-21 DIAGNOSIS — I4819 Other persistent atrial fibrillation: Secondary | ICD-10-CM

## 2016-06-21 NOTE — Patient Instructions (Signed)
Medication Instructions: - Your physician has recommended you make the following change in your medication:  1) Increase lasix (furosemide) 20 mg- take 4 tablets (80 mg) by mouth once daily  Labwork: - none ordered  Procedures/Testing: - none ordered  Follow-Up: - Your physician wants you to follow-up in: 4 months with Dr. Caryl Comes. You will receive a reminder letter in the mail two months in advance. If you don't receive a letter, please call our office to schedule the follow-up appointment.   Any Additional Special Instructions Will Be Listed Below (If Applicable).     If you need a refill on your cardiac medications before your next appointment, please call your pharmacy.

## 2016-06-21 NOTE — Progress Notes (Signed)
ELECTROPHYSIOLOGY OFFICE  NOTE  Patient ID: Dawn Garcia, MRN: [id number], DOB/AGE: 1941-03-11 76 y.o. Admit date: (Not on file) Date of Consult: 06/21/2016  Primary Physician: Halina Maidens, MD Primary Cardiologist: tg       HPI [name] is a 76 y.o. female  Seen in followup for pacing by Dr. 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 This was complicated by right atrial lead dislodgment requiring revision.  Because of recurrent atrial fibrillation he started her on dronaderone in the fall which was complicated by diarrhea. It was her impression however that prior to its discontinuation it had been associated with a marked improvement in exercise tolerance, allowing her to walk a flight of stairs without stopping for example.  She has a history of a prior PCI.  When last seen 2/18 the issue was could we correlate atrial fibrillation with her impaired exercise tolerance.   DATE TEST    6/17    Echo   EF 65 %   11/17    Myoview   EF 54 % Anteroapical defect          She has treated sleep apnea  She's had her as she adjusted and she is feeling good deal better.  She's had episodic weight gain which is associated with fatigue and exercise intolerance. This is treated by doubling her diuretic from 60--120. She notes that 60 mg a day does not promote diuresis on a regular basis.  Overall however she feels a good deal better than she did 2 months ago    CHA2DS2/VAS Stroke Risk Points  6       1 Has Congestive Heart Failure:  Yes   1 Has Vascular Disease:  Yes   0 Has Hypertension:  No   2 Age:  62   1 Has Diabetes:  Yes   0 Had Stroke:  No Had TIA:  No Had thromboembolism:  No   1 Female:  Yes          Past Medical History:  Diagnosis Date  . Atrial fibrillation ([place])   . CAD (coronary artery disease)   . Cancer ([place])    skin  . CHF (congestive heart failure) (HCC)     A-Fib  . COPD (chronic obstructive pulmonary disease) ([place])   . Diabetes mellitus  without complication ([place])   . GERD (gastroesophageal reflux disease)   . Hyperlipidemia   . Hypocalcemia   . Hypokalemia   . Macular degeneration   . Myocardial infarction   . 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 Sinus node dysfunction (HCC)   . Thyroid disease   . Vitamin B12 deficiency   . Vitamin D deficiency       Surgical History:  Past Surgical History:  Procedure Laterality Date  . CHOLECYSTECTOMY    . COLONOSCOPY  2015   1 polyp removed- repeat 3 years  . CORONARY ANGIOPLASTY WITH STENT PLACEMENT    . EP IMPLANTABLE DEVICE N/A 11/10/2015   Procedure: Pacemaker Implant;  Surgeon: Will [name], MD;  Location: [facility];  Service: Cardiovascular;  Laterality: N/A;  . EP IMPLANTABLE DEVICE N/A 11/11/2015   Procedure: Lead Revision/Repair;  Surgeon: Evans Lance, MD;  Location: [facility];  Service: Cardiovascular;  Laterality: N/A;  . parathyroid surgery     x 2  . THYROID SURGERY    . TOTAL SHOULDER ARTHROPLASTY Right    x 2  . VAGINAL HYSTERECTOMY  Home Meds: Prior to Admission medications   Medication Sig Start Date End Date Taking? Authorizing Provider  albuterol (PROVENTIL HFA;VENTOLIN HFA) 108 (90 Base) MCG/ACT inhaler Inhale 2 puffs into the lungs every 6 (six) hours as needed for wheezing or shortness of breath.   Yes Historical Provider, MD  ARTIFICIAL TEAR OP Place 1 drop into both eyes as needed (for dry eyes).   Yes Historical Provider, MD  Cholecalciferol (VITAMIN D3) 5000 units TABS Take 1 tablet by mouth every morning.   Yes Historical Provider, MD  furosemide (LASIX) 20 MG tablet Take 60 mg by mouth every morning.   Yes Historical Provider, MD  isosorbide mononitrate (IMDUR) 30 MG 24 hr tablet Take 1 tablet (30 mg total) by mouth daily. 02/11/16  Yes Will [name], MD  levothyroxine (SYNTHROID, LEVOTHROID) 150 MCG tablet Take 1 tablet (150 mcg total) by mouth daily. 12/08/15  Yes Glean Hess, MD    Melatonin 5 MG TABS Take 1 tablet by mouth at bedtime.   Yes Historical Provider, MD  metFORMIN (GLUCOPHAGE) 500 MG tablet Take 1 tablet (500 mg total) by mouth 2 (two) times daily with a meal. 09/16/15  Yes Adline Potter, MD  metoprolol succinate (TOPROL XL) 25 MG 24 hr tablet Take 1 tablet (25 mg total) by mouth daily. 02/11/16  Yes Will [name], MD  nitroGLYCERIN (NITROSTAT) 0.4 MG SL tablet Place 0.4 mg under the tongue every 5 (five) minutes as needed for chest pain.   Yes Historical Provider, MD  pantoprazole (PROTONIX) 40 MG tablet Take 1 tablet (40 mg total) by mouth daily. 10/16/15  Yes Juline Patch, MD  potassium chloride (KLOR-CON) 8 MEQ tablet Take 1 tablet (8 mEq total) by mouth 2 (two) times daily. 12/21/15  Yes Glean Hess, MD  rosuvastatin (CRESTOR) 40 MG tablet Take 1 tablet (40 mg total) by mouth daily. 09/18/15  Yes Minna Merritts, MD  sitaGLIPtin (JANUVIA) 50 MG tablet Take 50 mg by mouth daily.   Yes Historical Provider, MD  vitamin B-12 (CYANOCOBALAMIN) 500 MCG tablet Take 500 mcg by mouth daily.   Yes Historical Provider, MD  warfarin (COUMADIN) 7.5 MG tablet Take 1 tablet (7.5 mg total) by mouth daily. 01/20/16  Yes Minna Merritts, MD    Allergies:  Allergies  Allergen Reactions  . Demerol [Meperidine] Anaphylaxis    Tolerated Fentanyl 11/10/15  . Sulfa Antibiotics Anaphylaxis  . Tetracyclines & Related Other (See Comments)    Made nose, lips,  And tongue itchy    Social History   Social History  . Marital status: Married    Spouse name: N/A  . Number of children: N/A  . Years of education: N/A   Occupational History  . Retired    Social History Main Topics  . Smoking status: Never Smoker  . Smokeless tobacco: Never Used  . Alcohol use 0.0 oz/week  . Drug use: No  . Sexual activity: Not on file   Other Topics Concern  . Not on file   Social History Narrative   Lives in [place] w/ husband.     Family History  Problem Relation Age of  Onset  . Thyroid disease Mother 43    3 days after surgery  . Heart defect Father 40    ?ascending aortic aneurysm     ROS:  Please see the history of present illness.     All other systems reviewed and negative.    Physical Exam:  Blood pressure 120/84,  pulse 60, height 5\' 3"  (1.6 m), weight 216 lb 4 oz (98.1 kg). Well developed and nourished in no acute distress HENT normal Neck supple with JVP-flat Clear Regular rate and rhythm, no murmurs or gallops Abd-soft with active BS No Clubbing cyanosis edema Skin-warm and dry A & Oriented  Grossly normal sensory and motor function Affect good    Labs: Cardiac Enzymes No results for input(s): CKTOTAL, CKMB, TROPONINI in the last 72 hours. CBC Lab Results  Component Value Date   WBC 8.5 11/12/2015   HGB 12.7 11/12/2015   HCT 39.1 11/12/2015   MCV 88.9 11/12/2015   PLT 154 11/12/2015   PROTIME: No results for input(s): LABPROT, INR in the last 72 hours. Chemistry No results for input(s): NA, K, CL, CO2, BUN, CREATININE, CALCIUM, PROT, BILITOT, ALKPHOS, ALT, AST, GLUCOSE in the last 168 hours.  Invalid input(s): LABALBU Lipids Lab Results  Component Value Date   CHOL 127 04/19/2016   HDL 42 04/19/2016   LDLCALC 43 04/19/2016   TRIG 209 (H) 04/19/2016   BNP No results found for: PROBNP Thyroid Function Tests: No results for input(s): TSH, T4TOTAL, T3FREE, THYROIDAB in the last 72 hours.  Invalid input(s): FREET3 Miscellaneous No results found for: DDIMER  Radiology/Studies:  No results found.  EKG:AV pacing   Assessment and Plan:  Sick Sinus syndrome  Persistent Atrial Fib with prior PVI  Pacemaker St [employer]   OSA  P-wave oversensing resulting false detection of atrial fibrillation  HFpEF acute episodic  Coronary artery disease with prior stenting  Renal insufficiency-worsening grade 4  Without symptoms of ischemia  She continues with episodic problems with exercise intolerance. Sometimes these  correlated with fluid accumulation. This may or may not be related to her atrial fibrillation or impaired diuresis. We will have her try and correlate atrial fibrillation with fluid accumulation; however, in the interim we will also have her increase her diuretic from 60--80. We have reviewed the importance of the threshold affect of her diuretic.  LV function is of some concern. Her echocardiogram and Myoview scans undertaking overs and of 8 months showed a possible LV function change from the 65--55% range. Will anticipate repeat evaluation of her LV function at her next visit.  She continues to have 6 episodes per hour on her sleep apnea. I've asked her to follow-up with her pulmonologist.  Renal function for whatever reason is vastly improved.      Virl Axe

## 2016-07-06 LAB — CUP PACEART INCLINIC DEVICE CHECK
Battery Voltage: 3.01 V
Brady Statistic RA Percent Paced: 66 %
Brady Statistic RV Percent Paced: 96 %
Implantable Lead Implant Date: 20170829
Implantable Lead Implant Date: 20170829
Implantable Lead Location: 753859
Lead Channel Impedance Value: 475 Ohm
Lead Channel Impedance Value: 612.5 Ohm
Lead Channel Pacing Threshold Amplitude: 1.125 V
Lead Channel Pacing Threshold Pulse Width: 0.4 ms
Lead Channel Sensing Intrinsic Amplitude: 12 mV
Lead Channel Sensing Intrinsic Amplitude: 4.4 mV
Lead Channel Setting Pacing Amplitude: 2.125
MDC IDC LEAD LOCATION: 753860
MDC IDC MSMT LEADCHNL RV PACING THRESHOLD AMPLITUDE: 1 V
MDC IDC MSMT LEADCHNL RV PACING THRESHOLD PULSEWIDTH: 0.4 ms
MDC IDC PG IMPLANT DT: 20170829
MDC IDC SESS DTM: 20180410140054
MDC IDC SET LEADCHNL RV PACING AMPLITUDE: 2.5 V
MDC IDC SET LEADCHNL RV PACING PULSEWIDTH: 0.4 ms
MDC IDC SET LEADCHNL RV SENSING SENSITIVITY: 2 mV
Pulse Gen Serial Number: 7945290

## 2016-07-08 ENCOUNTER — Ambulatory Visit: Payer: Medicare Other | Admitting: Internal Medicine

## 2016-07-13 ENCOUNTER — Ambulatory Visit (INDEPENDENT_AMBULATORY_CARE_PROVIDER_SITE_OTHER): Payer: Medicare Other | Admitting: *Deleted

## 2016-07-13 DIAGNOSIS — I2510 Atherosclerotic heart disease of native coronary artery without angina pectoris: Secondary | ICD-10-CM

## 2016-07-13 DIAGNOSIS — I480 Paroxysmal atrial fibrillation: Secondary | ICD-10-CM | POA: Diagnosis not present

## 2016-07-13 DIAGNOSIS — Z7901 Long term (current) use of anticoagulants: Secondary | ICD-10-CM | POA: Diagnosis not present

## 2016-07-13 DIAGNOSIS — Z7189 Other specified counseling: Secondary | ICD-10-CM

## 2016-07-13 DIAGNOSIS — I4891 Unspecified atrial fibrillation: Secondary | ICD-10-CM | POA: Diagnosis not present

## 2016-07-13 LAB — POCT INR: INR: 2.8

## 2016-07-14 DIAGNOSIS — H43813 Vitreous degeneration, bilateral: Secondary | ICD-10-CM | POA: Diagnosis not present

## 2016-07-14 DIAGNOSIS — H353112 Nonexudative age-related macular degeneration, right eye, intermediate dry stage: Secondary | ICD-10-CM | POA: Diagnosis not present

## 2016-07-14 DIAGNOSIS — H353221 Exudative age-related macular degeneration, left eye, with active choroidal neovascularization: Secondary | ICD-10-CM | POA: Diagnosis not present

## 2016-07-14 DIAGNOSIS — H35433 Paving stone degeneration of retina, bilateral: Secondary | ICD-10-CM | POA: Diagnosis not present

## 2016-07-26 ENCOUNTER — Other Ambulatory Visit: Payer: Self-pay | Admitting: *Deleted

## 2016-07-26 MED ORDER — WARFARIN SODIUM 7.5 MG PO TABS: ORAL_TABLET | ORAL | 1 refills | Status: DC

## 2016-08-01 ENCOUNTER — Telehealth: Payer: Self-pay | Admitting: Internal Medicine

## 2016-08-01 NOTE — Telephone Encounter (Signed)
Pt calling stating her lasik that she is on 100 mg  Is not working she feels she needs something stronger   Also she is running out and needs a refill on that medication  Please send to [facility] on [address] in [place]   Please advise

## 2016-08-01 NOTE — Telephone Encounter (Signed)
Returned call to patient. Patient has been taking furosemide 100 mg by mouth daily since her last OV with Dr Caryl Comes on 06/21/16 though he had increased it to 80 mg. Usually patient will cycle with gaining 2-3 lbs over night then lost 1 lb then gain up to a total of 5 lbs over a 5-6 day period before she diuresis. She reports waking with a little bit of wheezing in the morning which gets better throughout the day. She uses her inhaler, it does help with her SOB and wheezing but she forgets to use it. She verbalized understanding to use it as needed as prescribed. States she feels she's been in and out of A. Fib since her last OV. Reports some occasional chest pain that may last for several hours and subsides when she goes to sleep.  SOB comes and goes, sometimes at rest, sometimes with exertion. Patient is not SOB or having chest pain at this time. Advised patient to to to the ER if she experiences worsening SOB or chest pain or symptoms. She verbalized understanding.  She wants to see if Dr Caryl Comes will refill her furosemide for 100 mg daily or if she needs another medication to help with her fluid as they previously discussed. Routing to Dr Caryl Comes for advice.

## 2016-08-03 ENCOUNTER — Other Ambulatory Visit: Payer: Self-pay | Admitting: Internal Medicine

## 2016-08-03 DIAGNOSIS — N183 Chronic kidney disease, stage 3 unspecified: Secondary | ICD-10-CM

## 2016-08-03 NOTE — Telephone Encounter (Signed)
Pt calling stating she is going out of town in the morning and needs [place] to please respond as soon as we can  Please call back

## 2016-08-04 MED ORDER — FUROSEMIDE 20 MG PO TABS: 100.0000 mg | ORAL_TABLET | Freq: Every day | ORAL | 0 refills | Status: DC

## 2016-08-04 NOTE — Telephone Encounter (Signed)
Continue diuretic Will need to think about antiarrhythmic drugs if more afib like she thinks

## 2016-08-04 NOTE — Telephone Encounter (Signed)
I called and spoke with the patient- she states she has been taking the lasix 100 mg daily dose- this has not been bringing her weight back to baseline over the last 2 weeks. She is currently about 5-6 lbs over her baseline weight. I advised her that Dr. Olin Pia concern is that her a-fib percentage may be driving her fluid retention. She is currently out of town on a trip to [place].  She is almost out of lasix- I advised I will send this dose in to the pharmacy where she is- she would like this sent to [facility] in [place], [place].  I have asked that she send a transmission in when she gets back on Tuesday so we may follow up on her a-fib burden.  She voices understanding.   Will forward to [facility] to be looking out for a transmission from the patient on Tuesday/ Wednesday next week.

## 2016-08-10 ENCOUNTER — Telehealth: Payer: Self-pay | Admitting: Internal Medicine

## 2016-08-10 NOTE — Telephone Encounter (Signed)
Pt calling stating she was to send a transmission on but is not sure if we were going to do it or was she to use the machine at her home Please call back

## 2016-08-10 NOTE — Telephone Encounter (Signed)
Advised pt to send transmission as instructed by [name], RN. She is unsure if she has pressed the correct button. Will route to [facility] so they are aware pt sending transmission

## 2016-08-10 NOTE — Telephone Encounter (Signed)
Spoke with patient and verified successful transmission was received.

## 2016-08-17 ENCOUNTER — Ambulatory Visit (INDEPENDENT_AMBULATORY_CARE_PROVIDER_SITE_OTHER): Payer: Medicare Other | Admitting: *Deleted

## 2016-08-17 DIAGNOSIS — I4891 Unspecified atrial fibrillation: Secondary | ICD-10-CM

## 2016-08-17 DIAGNOSIS — I480 Paroxysmal atrial fibrillation: Secondary | ICD-10-CM

## 2016-08-17 DIAGNOSIS — I2510 Atherosclerotic heart disease of native coronary artery without angina pectoris: Secondary | ICD-10-CM

## 2016-08-17 DIAGNOSIS — Z7189 Other specified counseling: Secondary | ICD-10-CM

## 2016-08-17 DIAGNOSIS — Z7901 Long term (current) use of anticoagulants: Secondary | ICD-10-CM

## 2016-08-17 LAB — POCT INR: INR: 1

## 2016-08-24 ENCOUNTER — Ambulatory Visit (INDEPENDENT_AMBULATORY_CARE_PROVIDER_SITE_OTHER): Payer: Medicare Other

## 2016-08-24 DIAGNOSIS — Z7901 Long term (current) use of anticoagulants: Secondary | ICD-10-CM | POA: Diagnosis not present

## 2016-08-24 DIAGNOSIS — Z7189 Other specified counseling: Secondary | ICD-10-CM

## 2016-08-24 DIAGNOSIS — I480 Paroxysmal atrial fibrillation: Secondary | ICD-10-CM

## 2016-08-24 DIAGNOSIS — I4891 Unspecified atrial fibrillation: Secondary | ICD-10-CM | POA: Diagnosis not present

## 2016-08-24 DIAGNOSIS — I2510 Atherosclerotic heart disease of native coronary artery without angina pectoris: Secondary | ICD-10-CM

## 2016-08-24 DIAGNOSIS — L218 Other seborrheic dermatitis: Secondary | ICD-10-CM | POA: Diagnosis not present

## 2016-08-24 DIAGNOSIS — L821 Other seborrheic keratosis: Secondary | ICD-10-CM | POA: Diagnosis not present

## 2016-08-24 DIAGNOSIS — L918 Other hypertrophic disorders of the skin: Secondary | ICD-10-CM | POA: Diagnosis not present

## 2016-08-24 LAB — POCT INR: INR: 1.8

## 2016-08-26 ENCOUNTER — Telehealth: Payer: Self-pay | Admitting: Internal Medicine

## 2016-08-26 NOTE — Telephone Encounter (Signed)
For now would probably take Lasix for any shortness of breath or leg swelling Avoid daily Lasix as this previously contributed to renal dysfunction

## 2016-08-26 NOTE — Telephone Encounter (Signed)
I am leaving the office for the afternoon and not back until Tuesday.  Device clinic- please review last transmission for a-fib burden  I am forwarding to [facility] to review fluid pill with Dr. Rockey Situ in Dr. Olin Pia absence once a-fib burden is known.   Thanks!

## 2016-08-26 NOTE — Telephone Encounter (Signed)
Device clinic- what was the patient's a-fib burden on her last transmission? Dr. Caryl Comes was afraid this might be driving some fluid retention for the patient. Was her burden up or about the same?  Thanks!

## 2016-08-26 NOTE — Telephone Encounter (Signed)
Spoke w/ pt.  She states that she was calling for Dr. Caryl Comes, not Dr. Rockey Situ.  She states that Dr. Caryl Comes ordered labs on her and she has not heard back w/ instructions and she is almost out of lasix.  She asks that I make Dr. Olin Pia nurse aware and have her call back w/ his recommendations.

## 2016-08-26 NOTE — Telephone Encounter (Signed)
Pt she would like to know if her Lasix needs to be changed based on her pacer report.

## 2016-08-29 DIAGNOSIS — H43813 Vitreous degeneration, bilateral: Secondary | ICD-10-CM | POA: Diagnosis not present

## 2016-08-29 DIAGNOSIS — H35433 Paving stone degeneration of retina, bilateral: Secondary | ICD-10-CM | POA: Diagnosis not present

## 2016-08-29 DIAGNOSIS — H353112 Nonexudative age-related macular degeneration, right eye, intermediate dry stage: Secondary | ICD-10-CM | POA: Diagnosis not present

## 2016-08-29 DIAGNOSIS — H353221 Exudative age-related macular degeneration, left eye, with active choroidal neovascularization: Secondary | ICD-10-CM | POA: Diagnosis not present

## 2016-08-29 MED ORDER — FUROSEMIDE 20 MG PO TABS: 100.0000 mg | ORAL_TABLET | Freq: Every day | ORAL | 3 refills | Status: DC

## 2016-08-29 NOTE — Telephone Encounter (Signed)
Spoke w/ pt.  Advised her of Dr. Donivan Scull recommendation.  She states that she has been taking lasix daily for years. She previously tried to decrease her lasix, but cannot due to fluid overload. She requests refill, as she is completely out.  Advised her that her afib burden is about the same according to her device interrogation. She states that she feels good overall. Asked her to call back w/ any further questions or concerns.

## 2016-09-01 DIAGNOSIS — Z6841 Body Mass Index (BMI) 40.0 and over, adult: Secondary | ICD-10-CM | POA: Diagnosis not present

## 2016-09-01 DIAGNOSIS — J449 Chronic obstructive pulmonary disease, unspecified: Secondary | ICD-10-CM | POA: Diagnosis not present

## 2016-09-05 ENCOUNTER — Encounter: Payer: Self-pay | Admitting: Internal Medicine

## 2016-09-05 ENCOUNTER — Ambulatory Visit (INDEPENDENT_AMBULATORY_CARE_PROVIDER_SITE_OTHER): Payer: Medicare Other | Admitting: Internal Medicine

## 2016-09-05 VITALS — BP 122/72 | HR 60 | Ht 63.0 in | Wt 219.0 lb

## 2016-09-05 DIAGNOSIS — E039 Hypothyroidism, unspecified: Secondary | ICD-10-CM

## 2016-09-05 DIAGNOSIS — I2510 Atherosclerotic heart disease of native coronary artery without angina pectoris: Secondary | ICD-10-CM | POA: Diagnosis not present

## 2016-09-05 DIAGNOSIS — D170 Benign lipomatous neoplasm of skin and subcutaneous tissue of head, face and neck: Secondary | ICD-10-CM

## 2016-09-05 DIAGNOSIS — E1121 Type 2 diabetes mellitus with diabetic nephropathy: Secondary | ICD-10-CM | POA: Diagnosis not present

## 2016-09-05 DIAGNOSIS — M159 Polyosteoarthritis, unspecified: Secondary | ICD-10-CM

## 2016-09-05 DIAGNOSIS — N183 Chronic kidney disease, stage 3 unspecified: Secondary | ICD-10-CM

## 2016-09-05 DIAGNOSIS — M21619 Bunion of unspecified foot: Secondary | ICD-10-CM | POA: Diagnosis not present

## 2016-09-05 DIAGNOSIS — E669 Obesity, unspecified: Secondary | ICD-10-CM | POA: Diagnosis not present

## 2016-09-05 LAB — HM DIABETES EYE EXAM

## 2016-09-05 MED ORDER — PANTOPRAZOLE SODIUM 40 MG PO TBEC: 40.0000 mg | DELAYED_RELEASE_TABLET | Freq: Every day | ORAL | 3 refills | Status: AC

## 2016-09-05 MED ORDER — FUROSEMIDE 20 MG PO TABS: 100.0000 mg | ORAL_TABLET | Freq: Every day | ORAL | 3 refills | Status: DC

## 2016-09-05 MED ORDER — LEVOTHYROXINE SODIUM 150 MCG PO TABS: 150.0000 ug | ORAL_TABLET | Freq: Every day | ORAL | 3 refills | Status: DC

## 2016-09-05 NOTE — Patient Instructions (Signed)
Begin tylenol XS (500 mg) take 2 twice a day

## 2016-09-05 NOTE — Progress Notes (Signed)
Date:  09/05/2016   Name:  Dawn Garcia   DOB:  01-28-1941   MRN:  [id number]   Chief Complaint: Hypothyroidism and Diabetes (BS this morning 198. Brought list so we can see. ) Diabetes  She presents for her follow-up diabetic visit. She has type 2 diabetes mellitus. Her disease course has been fluctuating. Pertinent negatives for hypoglycemia include no dizziness or headaches. Associated symptoms include fatigue. Pertinent negatives for diabetes include no chest pain, no polydipsia and no polyuria. Current diabetic treatment includes oral agent (triple therapy) (added glimepiride last visit). She is compliant with treatment most of the time. There is no change in her home blood glucose trend. Her breakfast blood glucose is taken between 7-8 am. Her breakfast blood glucose range is generally 140-180 mg/dl.  Thyroid Problem  Presents for follow-up visit. Symptoms include fatigue and palpitations. Patient reports no constipation.  Intermittent Afib - not on any rate control at present.  Still having episodes and feeling more short of breath at times. No edema but sometimes wheezes in the morning.  Still on Warfarin and begin monitored by cardiology.  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 Had xrays a number of years ago that showed OA at multiple joints in left hand.  She is not taking anything for pain - wonders if anything can be done.  She has given up knitting and card playing due to pain and stiffness.  Gout - stopped preventative several months ago and has not had any episodes.  Neck mass and back of neck mass - lipoma on left side of neck slightly larger and a new mass on back of neck.  Neither is painful but the left neck is more prominent. She is wondering if she should have them removed.  Lab Results  Component Value Date   HGBA1C 8.2 (H) 05/02/2016     Review of Systems  Constitutional: Positive for fatigue. Negative for chills and fever.  Eyes: Negative for visual  disturbance.  Respiratory: Positive for shortness of breath and wheezing. Negative for chest tightness.   Cardiovascular: Positive for palpitations. Negative for chest pain and leg swelling.  Gastrointestinal: Negative for abdominal pain and constipation.  Endocrine: Negative for polydipsia and polyuria.  Musculoskeletal: Positive for arthralgias and myalgias.  Skin: Negative for color change and rash.  Neurological: Negative for dizziness and headaches.  Psychiatric/Behavioral: Negative for sleep disturbance.    Patient Active Problem List   Diagnosis Date Noted  . Hyperlipidemia 09/18/2015  . Symptomatic bradycardia 09/18/2015  . Coronary artery disease 09/10/2015  . B12 deficiency 09/10/2015  . History of melanoma 09/09/2015  . COPD (chronic obstructive pulmonary disease) ([place]) 09/09/2015  . Hypothyroidism following radioiodine therapy 09/09/2015  . Encounter for anticoagulation discussion and counseling 08/26/2015  . Obesity, Class II, BMI 35-39.9 08/20/2015  . GERD with stricture 08/20/2015  . Macular degeneration 08/20/2015  . Diastolic CHF ([place]) 40/98/1191  . History of parathyroidectomy 08/20/2015  . CKD (chronic kidney disease) stage 3, GFR 30-59 ml/min 08/20/2015  . PAF (paroxysmal atrial fibrillation) ([place]) 08/19/2015  . Controlled type 2 diabetes mellitus with diabetic nephropathy, without long-term current use of insulin ([place]) 08/19/2015  . Primary gout 08/19/2015    Prior to Admission medications   Medication Sig Start Date End Date Taking? Authorizing Provider  albuterol (PROVENTIL HFA;VENTOLIN HFA) 108 (90 Base) MCG/ACT inhaler Inhale 2 puffs into the lungs every 6 (six) hours as needed for wheezing or shortness of breath.   Yes [provider]  Alpha-Lipoic Acid 200 MG CAPS Take by mouth.   Yes [provider]  ARTIFICIAL TEAR OP Place 1 drop into both eyes as needed (for dry eyes).   Yes [provider]  Biotin 1000 MCG CHEW Chew by  mouth.   Yes [provider]  Cholecalciferol (VITAMIN D3) 5000 units TABS Take 1 tablet by mouth every morning.   Yes [provider]  furosemide (LASIX) 20 MG tablet Take 5 tablets (100 mg total) by mouth daily. 08/29/16 11/27/16 Yes Minna Merritts, MD  glimepiride (AMARYL) 1 MG tablet Take 1 tablet (1 mg total) by mouth daily with breakfast. 05/03/16  Yes Glean Hess, MD  levothyroxine (SYNTHROID, LEVOTHROID) 150 MCG tablet Take 1 tablet (150 mcg total) by mouth daily. 12/08/15  Yes Glean Hess, MD  Melatonin 5 MG TABS Take 1 tablet by mouth at bedtime.   Yes [provider]  metFORMIN (GLUCOPHAGE) 500 MG tablet Take 1 tablet (500 mg total) by mouth daily with breakfast. 04/20/16  Yes Glean Hess, MD  Multiple Vitamins-Minerals (ICAPS AREDS 2 PO) Take by mouth.   Yes [provider]  nitroGLYCERIN (NITROSTAT) 0.4 MG SL tablet Place 0.4 mg under the tongue every 5 (five) minutes as needed for chest pain.   Yes [provider]  Omega-3 Fatty Acids (FISH OIL) 1200 MG CAPS Take by mouth.   Yes [provider]  pantoprazole (PROTONIX) 40 MG tablet TAKE ONE TABLET BY MOUTH ONCE DAILY 04/22/16  Yes Plonk, Gwyndolyn Saxon, MD  potassium chloride (KLOR-CON) 8 MEQ tablet Take 1 tablet (8 mEq total) by mouth 2 (two) times daily. 12/21/15  Yes Glean Hess, MD  rosuvastatin (CRESTOR) 40 MG tablet Take 1 tablet (40 mg total) by mouth daily. 06/15/16  Yes Gollan, Kathlene November, MD  sitaGLIPtin (JANUVIA) 50 MG tablet Take 50 mg by mouth daily.   Yes [provider]  vitamin B-12 (CYANOCOBALAMIN) 500 MCG tablet Take 500 mcg by mouth daily.   Yes [provider]  warfarin (COUMADIN) 7.5 MG tablet Take as directed by coumadin clinic 07/26/16  Yes Gollan, Kathlene November, MD    Allergies  Allergen Reactions  . Demerol [Meperidine] Anaphylaxis    Tolerated Fentanyl 11/10/15  . Sulfa Antibiotics Anaphylaxis  . Tetracyclines & Related Other (See  Comments)    Made nose, lips,  And tongue itchy    Past Surgical History:  Procedure Laterality Date  . CHOLECYSTECTOMY    . COLONOSCOPY  2015   1 polyp removed- repeat 3 years  . CORONARY ANGIOPLASTY WITH STENT PLACEMENT    . EP IMPLANTABLE DEVICE N/A 11/10/2015   Procedure: Pacemaker Implant;  Surgeon: Will [name], MD;  Location: [facility];  Service: Cardiovascular;  Laterality: N/A;  . EP IMPLANTABLE DEVICE N/A 11/11/2015   Procedure: Lead Revision/Repair;  Surgeon: Evans Lance, MD;  Location: [facility];  Service: Cardiovascular;  Laterality: N/A;  . parathyroid surgery     x 2  . THYROID SURGERY    . TOTAL SHOULDER ARTHROPLASTY Right    x 2  . VAGINAL HYSTERECTOMY      Social History  Substance Use Topics  . Smoking status: Never Smoker  . Smokeless tobacco: Never Used  . Alcohol use 0.0 oz/week     Medication list has been reviewed and updated.   Physical Exam  Constitutional: She is oriented to person, place, and time. She appears well-developed. No distress.  HENT:  Head: Normocephalic  and atraumatic.  Neck: Normal range of motion. Neck supple.    Cardiovascular: Normal rate and regular rhythm.   Pulses:      Dorsalis pedis pulses are 1+ on the right side, and 1+ on the left side.  Pulmonary/Chest: Effort normal and breath sounds normal. No accessory muscle usage. No respiratory distress. She has no wheezes. She has no rhonchi.  Musculoskeletal: She exhibits no edema.  OA changes of MCP, PIP and DIP joints of both hands  Bunions of both feet, second toes turning under great toes.  Early hammertoe 4th toes bilaterally  Neurological: She is alert and oriented to person, place, and time.  Skin: Skin is warm and dry. No rash noted.  Psychiatric: She has a normal mood and affect. Her behavior is normal. Thought content normal.  Nursing note and vitals reviewed.   BP 122/72   Pulse 60   Ht 5\' 3"  (1.6 m)   Wt 219 lb (99.3 kg)   SpO2  98%   BMI 38.79 kg/m   Assessment and Plan: 1. Controlled type 2 diabetes mellitus with diabetic nephropathy, without long-term current use of insulin (HCC) Likely not controlled Samples of Jardiance 10 mg given - will instruct on starting after labs return Consider also add a second dose of metformin - Hemoglobin A1c - Microalbumin / creatinine urine ratio  2. CKD (chronic kidney disease) stage 3, GFR 30-59 ml/min Continue to monitor - Basic metabolic panel  3. 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 Obesity, Class II, BMI 35-39.9 May get weight loss benefit from Jardiance  5. Lipoma of neck Pt reassured - no intervention needed  6. Hypothyroidism (acquired) supplemented - levothyroxine (SYNTHROID, LEVOTHROID) 150 MCG tablet; Take 1 tablet (150 mcg total) by mouth daily.  Dispense: 90 tablet; Refill: 3  7. Bunion of great toe Consider podiatry evaluation   Meds ordered this encounter  Medications  . levothyroxine (SYNTHROID, LEVOTHROID) 150 MCG tablet    Sig: Take 1 tablet (150 mcg total) by mouth daily.    Dispense:  90 tablet    Refill:  3  . furosemide (LASIX) 20 MG tablet    Sig: Take 5 tablets (100 mg total) by mouth daily.    Dispense:  450 tablet    Refill:  3  . pantoprazole (PROTONIX) 40 MG tablet    Sig: Take 1 tablet (40 mg total) by mouth daily.    Dispense:  90 tablet    Refill:  [place], MD [facility]  09/05/2016

## 2016-09-06 LAB — MICROALBUMIN / CREATININE URINE RATIO
Creatinine, Urine: 26.2 mg/dL
Microalb/Creat Ratio: <11.5 mg/g creat (ref 0.0–30.0)
Microalbumin, Urine: <3 ug/mL

## 2016-09-06 LAB — BASIC METABOLIC PANEL
BUN/Creatinine Ratio: 19 (ref 12–28)
BUN: 21 mg/dL (ref 8–27)
CHLORIDE: 102 mmol/L (ref 96–106)
CO2: 24 mmol/L (ref 20–29)
Calcium: 9 mg/dL (ref 8.7–10.3)
Creatinine, Ser: 1.1 mg/dL — ABNORMAL HIGH (ref 0.57–1.00)
GFR calc Af Amer: 57 mL/min/{1.73_m2} — ABNORMAL LOW (ref >59)
GFR calc non Af Amer: 49 mL/min/{1.73_m2} — ABNORMAL LOW (ref >59)
GLUCOSE: 209 mg/dL — AB (ref 65–99)
POTASSIUM: 4.1 mmol/L (ref 3.5–5.2)
SODIUM: 142 mmol/L (ref 134–144)

## 2016-09-06 LAB — HEMOGLOBIN A1C
ESTIMATED AVERAGE GLUCOSE: 186 mg/dL
HEMOGLOBIN A1C: 8.1 % — AB (ref 4.8–5.6)

## 2016-09-07 ENCOUNTER — Ambulatory Visit (INDEPENDENT_AMBULATORY_CARE_PROVIDER_SITE_OTHER): Payer: Medicare Other

## 2016-09-07 DIAGNOSIS — Z7901 Long term (current) use of anticoagulants: Secondary | ICD-10-CM | POA: Diagnosis not present

## 2016-09-07 DIAGNOSIS — I4891 Unspecified atrial fibrillation: Secondary | ICD-10-CM

## 2016-09-07 DIAGNOSIS — I480 Paroxysmal atrial fibrillation: Secondary | ICD-10-CM

## 2016-09-07 DIAGNOSIS — Z7189 Other specified counseling: Secondary | ICD-10-CM

## 2016-09-07 DIAGNOSIS — I2510 Atherosclerotic heart disease of native coronary artery without angina pectoris: Secondary | ICD-10-CM

## 2016-09-07 LAB — POCT INR: INR: 3.4

## 2016-09-17 ENCOUNTER — Other Ambulatory Visit: Payer: Self-pay | Admitting: Internal Medicine

## 2016-09-20 ENCOUNTER — Ambulatory Visit (INDEPENDENT_AMBULATORY_CARE_PROVIDER_SITE_OTHER): Payer: Medicare Other | Admitting: *Deleted

## 2016-09-20 DIAGNOSIS — I495 Sick sinus syndrome: Secondary | ICD-10-CM | POA: Diagnosis not present

## 2016-09-20 NOTE — Progress Notes (Signed)
Remote pacemaker transmission.   

## 2016-09-21 ENCOUNTER — Ambulatory Visit (INDEPENDENT_AMBULATORY_CARE_PROVIDER_SITE_OTHER): Payer: Medicare Other | Admitting: *Deleted

## 2016-09-21 DIAGNOSIS — Z7189 Other specified counseling: Secondary | ICD-10-CM

## 2016-09-21 DIAGNOSIS — I2510 Atherosclerotic heart disease of native coronary artery without angina pectoris: Secondary | ICD-10-CM | POA: Diagnosis not present

## 2016-09-21 DIAGNOSIS — I480 Paroxysmal atrial fibrillation: Secondary | ICD-10-CM | POA: Diagnosis not present

## 2016-09-21 DIAGNOSIS — I4891 Unspecified atrial fibrillation: Secondary | ICD-10-CM | POA: Diagnosis not present

## 2016-09-21 DIAGNOSIS — Z7901 Long term (current) use of anticoagulants: Secondary | ICD-10-CM

## 2016-09-21 LAB — POCT INR: INR: 2.5

## 2016-09-22 LAB — CUP PACEART REMOTE DEVICE CHECK
Battery Remaining Longevity: 105 mo
Battery Remaining Percentage: 95.5 %
Battery Voltage: 3.01 V
Brady Statistic AP VP Percent: 62 %
Brady Statistic RA Percent Paced: 49 %
Implantable Lead Implant Date: 20170829
Implantable Lead Location: 753859
Implantable Pulse Generator Implant Date: 20170829
Lead Channel Impedance Value: 610 Ohm
Lead Channel Pacing Threshold Amplitude: 1 V
Lead Channel Pacing Threshold Pulse Width: 0.4 ms
Lead Channel Sensing Intrinsic Amplitude: 4.8 mV
MDC IDC LEAD IMPLANT DT: 20170829
MDC IDC LEAD LOCATION: 753860
MDC IDC MSMT LEADCHNL RA IMPEDANCE VALUE: 460 Ohm
MDC IDC MSMT LEADCHNL RV PACING THRESHOLD AMPLITUDE: 1 V
MDC IDC MSMT LEADCHNL RV PACING THRESHOLD PULSEWIDTH: 0.4 ms
MDC IDC MSMT LEADCHNL RV SENSING INTR AMPL: 12 mV
MDC IDC PG SERIAL: 7945290
MDC IDC SESS DTM: 20180710082714
MDC IDC SET LEADCHNL RA PACING AMPLITUDE: 2 V
MDC IDC SET LEADCHNL RV PACING AMPLITUDE: 2.5 V
MDC IDC SET LEADCHNL RV PACING PULSEWIDTH: 0.4 ms
MDC IDC SET LEADCHNL RV SENSING SENSITIVITY: 2 mV
MDC IDC STAT BRADY AP VS PERCENT: 1 %
MDC IDC STAT BRADY AS VP PERCENT: 37 %
MDC IDC STAT BRADY AS VS PERCENT: 1 %
MDC IDC STAT BRADY RV PERCENT PACED: 92 %

## 2016-09-27 ENCOUNTER — Encounter: Payer: Self-pay | Admitting: Cardiology

## 2016-09-29 DIAGNOSIS — H40003 Preglaucoma, unspecified, bilateral: Secondary | ICD-10-CM | POA: Diagnosis not present

## 2016-10-01 NOTE — Progress Notes (Signed)
Cardiology Office Note  Date:  10/03/2016   ID:  Dawn Garcia, DOB 1940-05-11, MRN [id number]  PCP:  Glean Hess, MD   Chief Complaint  Patient presents with  . other    6 month f/u c/o sob. Meds reviewed verbally with pt.    HPI:  Dawn Garcia is a pleasant 76 year old woman with a history of  coronary artery disease,  stent in January 2010 (location unclear), catheterization 07/26/2012 showing stable mild coronary artery disease, patent stent,  paroxysmal atrial fibrillation with history of ablation,  Prior history of ablation in 2013 Event monitor recently showing conversion pauses 5 seconds Pacemaker placed Paroxysmal atrial fibrillation seems to be associated with shortness of breath episodes who presents for routine follow-up for symptoms of shortness of breath  "overall not fine" SOB "when having atrial fib". Finds it hard to function with the breathing Off multaq, stopped secondary to diarrhea Cant tolerate metoprolol, profound fatigue, would prefer not to have beta blockers Previously tried diltiazem, stopped for bradycardia. Now has a pacemaker She does not remember having side effects on Cardizem apart from bradycardia. Denied having leg swelling   Takes lasix 100 mg daily Without such a high dose of Lasix she has shortness of breath all the time  Previous hx Stress test 08/2015:no ischemia, Small apical defect Stress test 01/2016: no ischemia, Small apical defect  Pacer download reviewed with her showing 22% of the time she is atrial fibrillation  HBA1C 7.5 Cr 1.69 Takes 2 potassium a day Total chol 200,   started on Crestor  EKG on today's visit shows paced rhythm rate 71 bpm, unable to estimate QTC  Other past medical history reviewed with her today Diagnosed with sick sinus syndrome August 2017, conversion pauses  She had  Implantation of a SJM dual chamber PPM on 11/10/15 by Dr Curt Bears.   The patient received a [place]  [id number] (serial number [id number] ), with [place] [id number] (serial number [id number]) right atrial lead and a [employer] model [id number] (serial number [id number]) right ventricular lead.  There were no immediate post procedure complications. 2. Conclusion: Successful atrial lead revision in a patient with sinus node dysfunction status post permanent pacemaker insertion who had previously had dislodgment of her atrial lead  Echocardiogram June 2017 with normal ejection fraction, 60%     PMH:   has a past medical history of Atrial fibrillation ([place]); CAD (coronary artery disease); Cancer [facility]); CHF (congestive heart failure) ([place]); COPD (chronic obstructive pulmonary disease) ([place]); Diabetes mellitus without complication ([place]); GERD (gastroesophageal reflux disease); Hyperlipidemia; Hypocalcemia; Hypokalemia; Macular degeneration; Myocardial infarction ([place]); 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  PSH:    Past Surgical History:  Procedure Laterality Date  . CHOLECYSTECTOMY    . COLONOSCOPY  2015   1 polyp removed- repeat 3 years  . CORONARY ANGIOPLASTY WITH STENT PLACEMENT    . EP IMPLANTABLE DEVICE N/A 11/10/2015   Procedure: Pacemaker Implant;  Surgeon: Will [name], MD;  Location: [facility];  Service: Cardiovascular;  Laterality: N/A;  . EP IMPLANTABLE DEVICE N/A 11/11/2015   Procedure: Lead Revision/Repair;  Surgeon: Evans Lance, MD;  Location: [facility];  Service: Cardiovascular;  Laterality: N/A;  . parathyroid surgery     x 2  . THYROID SURGERY    . TOTAL SHOULDER ARTHROPLASTY Right    x 2  .  VAGINAL HYSTERECTOMY      Current Outpatient Prescriptions  Medication Sig Dispense Refill  . albuterol (PROVENTIL HFA;VENTOLIN HFA) 108 (90 Base) MCG/ACT inhaler Inhale 2 puffs into the lungs every 6 (six) hours as needed for wheezing  or shortness of breath.    . Alpha-Lipoic Acid 200 MG CAPS Take by mouth.    . ARTIFICIAL TEAR OP Place 1 drop into both eyes as needed (for dry eyes).    . Biotin 1000 MCG CHEW Chew by mouth.    . Cholecalciferol (VITAMIN D3) 5000 units TABS Take 1 tablet by mouth every morning.    . furosemide (LASIX) 20 MG tablet Take 5 tablets (100 mg total) by mouth daily. 450 tablet 3  . glimepiride (AMARYL) 1 MG tablet Take 1 tablet (1 mg total) by mouth daily with breakfast. 90 tablet 1  . levothyroxine (SYNTHROID, LEVOTHROID) 150 MCG tablet Take 1 tablet (150 mcg total) by mouth daily. 90 tablet 3  . Melatonin 5 MG TABS Take 1 tablet by mouth at bedtime.    . metFORMIN (GLUCOPHAGE) 500 MG tablet Take 1 tablet (500 mg total) by mouth daily with breakfast. 90 tablet 1  . Multiple Vitamins-Minerals (ICAPS AREDS 2 PO) Take by mouth.    . nitroGLYCERIN (NITROSTAT) 0.4 MG SL tablet Place 0.4 mg under the tongue every 5 (five) minutes as needed for chest pain.    . Omega-3 Fatty Acids (FISH OIL) 1200 MG CAPS Take by mouth.    . pantoprazole (PROTONIX) 40 MG tablet Take 1 tablet (40 mg total) by mouth daily. 90 tablet 3  . potassium chloride (KLOR-CON) 8 MEQ tablet TAKE ONE TABLET BY MOUTH TWICE DAILY 180 tablet 2  . rosuvastatin (CRESTOR) 40 MG tablet Take 1 tablet (40 mg total) by mouth daily. 90 tablet 2  . vitamin B-12 (CYANOCOBALAMIN) 500 MCG tablet Take 500 mcg by mouth daily.    Marland Kitchen warfarin (COUMADIN) 7.5 MG tablet Take as directed by coumadin clinic 100 tablet 1   No current facility-administered medications for this visit.      Allergies:   Demerol [meperidine]; Multaq [dronedarone]; Sulfa antibiotics; and Tetracyclines & related   Social History:  The patient  reports that she has never smoked. She has never used smokeless tobacco. She reports that she drinks alcohol. She reports that she does not use drugs.   Family History:   family history includes Heart defect (age of onset: 39) in her  father; Thyroid disease (age of onset: 44) in her mother.    Review of Systems: Review of Systems  Constitutional: Negative.   Respiratory: Positive for shortness of breath.   Cardiovascular: Negative.   Gastrointestinal: Positive for diarrhea.  Musculoskeletal: Negative.   Neurological: Negative.   Psychiatric/Behavioral: Negative.   All other systems reviewed and are negative.    PHYSICAL EXAM: VS:  BP 138/74 (BP Location: Left Arm, Patient Position: Sitting, Cuff Size: Large)   Pulse 71   Ht 5\' 3"  (1.6 m)   Wt 214 lb 8 oz (97.3 kg)   BMI 38.00 kg/m  , BMI Body mass index is 38 kg/m. GEN: Well nourished, well developed, in no acute distress , obese HEENT: normal  Neck: no JVD, carotid bruits, or masses Cardiac: RRR; no murmurs, rubs, or gallops,no edema  Respiratory:  clear to auscultation bilaterally, normal work of breathing GI: soft, nontender, nondistended, + BS Dawn: no deformity or atrophy  Skin: warm and dry, no rash Neuro:  Strength and sensation are intact Psych:  euthymic mood, full affect    Recent Labs: 11/12/2015: Hemoglobin 12.7; Platelets 154 03/10/2016: TSH 0.034 09/05/2016: BUN 21; Creatinine, Ser 1.10; Potassium 4.1; Sodium 142    Lipid Panel Lab Results  Component Value Date   CHOL 127 04/19/2016   HDL 42 04/19/2016   LDLCALC 43 04/19/2016   TRIG 209 (H) 04/19/2016      Wt Readings from Last 3 Encounters:  10/03/16 214 lb 8 oz (97.3 kg)  09/05/16 219 lb (99.3 kg)  06/21/16 216 lb 4 oz (98.1 kg)       ASSESSMENT AND PLAN:   PAF (paroxysmal atrial fibrillation) (HCC) -  She is no longer taking multaq secondary to diarrhea Symptomatic atrial fibrillation Recommended she be tried diltiazem extended release 120 mg daily She does not want beta blocker If she has side effects such as leg edema, could try amiodarone  Mixed hyperlipidemia - Recommended that she stay on the Crestor  Controlled type 2 diabetes mellitus with diabetic  nephropathy, without long-term current use of insulin ([place]) - We have encouraged continued exercise, careful diet management in an effort to lose weight.  Coronary artery disease involving native coronary artery of native heart without angina pectoris  Denies any chest pain concerning for angina Previous  stress test 2 Fixed defect apical region, no significant ischemia  Chronic diastolic congestive heart failure ([place]) -  She is on high dose Lasix 100 mg daily, renal function stable  Without such a high-dose she does not feel well has shortness of breath  Stressed importance that she does not get dehydration  Centrilobular emphysema (HCC) -  Reports symptoms are stable, shortness of breath improved with rhythm control  Encounter for anticoagulation discussion and counseling  Tolerating anticoagulation, no bleeding, steady gait   Total encounter time more than 25 minutes  Greater than 50% was spent in counseling and coordination of care with the patient   Disposition:   F/U  6 months   Orders Placed This Encounter  Procedures  . EKG 12-Lead     Signed, Esmond Plants, M.D., Ph.D. 10/03/2016  [place], [place]

## 2016-10-03 ENCOUNTER — Encounter: Payer: Self-pay | Admitting: Cardiovascular Disease

## 2016-10-03 ENCOUNTER — Ambulatory Visit (INDEPENDENT_AMBULATORY_CARE_PROVIDER_SITE_OTHER): Payer: Medicare Other | Admitting: Cardiovascular Disease

## 2016-10-03 VITALS — BP 138/74 | HR 71 | Ht 63.0 in | Wt 214.5 lb

## 2016-10-03 DIAGNOSIS — I25118 Atherosclerotic heart disease of native coronary artery with other forms of angina pectoris: Secondary | ICD-10-CM | POA: Diagnosis not present

## 2016-10-03 DIAGNOSIS — E782 Mixed hyperlipidemia: Secondary | ICD-10-CM

## 2016-10-03 DIAGNOSIS — I209 Angina pectoris, unspecified: Secondary | ICD-10-CM

## 2016-10-03 DIAGNOSIS — E1121 Type 2 diabetes mellitus with diabetic nephropathy: Secondary | ICD-10-CM

## 2016-10-03 DIAGNOSIS — Z7189 Other specified counseling: Secondary | ICD-10-CM

## 2016-10-03 DIAGNOSIS — N183 Chronic kidney disease, stage 3 unspecified: Secondary | ICD-10-CM

## 2016-10-03 DIAGNOSIS — J432 Centrilobular emphysema: Secondary | ICD-10-CM | POA: Diagnosis not present

## 2016-10-03 DIAGNOSIS — I2510 Atherosclerotic heart disease of native coronary artery without angina pectoris: Secondary | ICD-10-CM

## 2016-10-03 DIAGNOSIS — I480 Paroxysmal atrial fibrillation: Secondary | ICD-10-CM | POA: Diagnosis not present

## 2016-10-03 DIAGNOSIS — H40003 Preglaucoma, unspecified, bilateral: Secondary | ICD-10-CM | POA: Diagnosis not present

## 2016-10-03 MED ORDER — DILTIAZEM HCL ER COATED BEADS 120 MG PO CP24: 120.0000 mg | ORAL_CAPSULE | Freq: Every day | ORAL | 7 refills | Status: DC

## 2016-10-03 NOTE — Patient Instructions (Addendum)
Medication Instructions:   Please start cardizem 120 mg ER once a day Call if you get severe leg swelling or BP is too low  Labwork:  No new labs needed  Testing/Procedures:  No further testing at this time   Follow-Up: It was a pleasure seeing you in the office today. Please call us if you have new issues that need to be addressed before your next appt.  [phone]  Your physician wants you to follow-up in: 6 months.  You will receive a reminder letter in the mail two months in advance. If you don't receive a letter, please call our office to schedule the follow-up appointment.  If you need a refill on your cardiac medications before your next appointment, please call your pharmacy.

## 2016-10-07 ENCOUNTER — Other Ambulatory Visit: Payer: Self-pay | Admitting: Internal Medicine

## 2016-10-07 NOTE — Telephone Encounter (Signed)
Medication refill

## 2016-10-11 ENCOUNTER — Encounter: Payer: Self-pay | Admitting: Cardiology

## 2016-10-12 ENCOUNTER — Ambulatory Visit (INDEPENDENT_AMBULATORY_CARE_PROVIDER_SITE_OTHER): Payer: Medicare Other

## 2016-10-12 DIAGNOSIS — Z7189 Other specified counseling: Secondary | ICD-10-CM

## 2016-10-12 DIAGNOSIS — I4891 Unspecified atrial fibrillation: Secondary | ICD-10-CM | POA: Diagnosis not present

## 2016-10-12 DIAGNOSIS — I480 Paroxysmal atrial fibrillation: Secondary | ICD-10-CM

## 2016-10-12 DIAGNOSIS — Z7901 Long term (current) use of anticoagulants: Secondary | ICD-10-CM

## 2016-10-12 DIAGNOSIS — I2510 Atherosclerotic heart disease of native coronary artery without angina pectoris: Secondary | ICD-10-CM

## 2016-10-12 LAB — POCT INR: INR: 3.5

## 2016-10-17 ENCOUNTER — Telehealth: Payer: Self-pay

## 2016-10-17 ENCOUNTER — Other Ambulatory Visit: Payer: Self-pay | Admitting: Internal Medicine

## 2016-10-17 DIAGNOSIS — H43813 Vitreous degeneration, bilateral: Secondary | ICD-10-CM | POA: Diagnosis not present

## 2016-10-17 DIAGNOSIS — H35433 Paving stone degeneration of retina, bilateral: Secondary | ICD-10-CM | POA: Diagnosis not present

## 2016-10-17 DIAGNOSIS — H35033 Hypertensive retinopathy, bilateral: Secondary | ICD-10-CM | POA: Diagnosis not present

## 2016-10-17 DIAGNOSIS — H353221 Exudative age-related macular degeneration, left eye, with active choroidal neovascularization: Secondary | ICD-10-CM | POA: Diagnosis not present

## 2016-10-17 MED ORDER — GLIMEPIRIDE 2 MG PO TABS: 2.0000 mg | ORAL_TABLET | Freq: Every day | ORAL | 0 refills | Status: DC

## 2016-10-17 MED ORDER — EMPAGLIFLOZIN 10 MG PO TABS: 10.0000 mg | ORAL_TABLET | Freq: Every day | ORAL | 5 refills | Status: DC

## 2016-10-17 NOTE — Telephone Encounter (Signed)
Patient called stating she has been having diarrhea for 4 weeks now. She goes anywhere between 4-10 times daily. She decided to stop taking the metformin 1 week ago and had her first regular BM this morning. She believes this is the cause of her diarrhea. Since stopping metformin her fasting BS has been elevated. This morning was 217 and yesterday was 300. Patient states she is still taking the samples of jardiance and the glimepiride. Please advise.

## 2016-10-17 NOTE — Telephone Encounter (Signed)
Stop metformin.  Continue jardiance (call for Rx when needed) and increase glimepiride to 2 mg per day. Follow up for DM with OV in 6 weeks.

## 2016-10-17 NOTE — Telephone Encounter (Signed)
Needs Jardiance now- sent to [facility] on [address].

## 2016-10-18 ENCOUNTER — Ambulatory Visit (INDEPENDENT_AMBULATORY_CARE_PROVIDER_SITE_OTHER): Payer: Medicare Other | Admitting: Internal Medicine

## 2016-10-18 ENCOUNTER — Encounter: Payer: Self-pay | Admitting: Internal Medicine

## 2016-10-18 VITALS — BP 122/68 | HR 60 | Ht 63.0 in | Wt 211.2 lb

## 2016-10-18 DIAGNOSIS — I5030 Unspecified diastolic (congestive) heart failure: Secondary | ICD-10-CM | POA: Diagnosis not present

## 2016-10-18 DIAGNOSIS — I481 Persistent atrial fibrillation: Secondary | ICD-10-CM | POA: Diagnosis not present

## 2016-10-18 DIAGNOSIS — Z950 Presence of cardiac pacemaker: Secondary | ICD-10-CM | POA: Diagnosis not present

## 2016-10-18 DIAGNOSIS — I495 Sick sinus syndrome: Secondary | ICD-10-CM | POA: Diagnosis not present

## 2016-10-18 DIAGNOSIS — I2510 Atherosclerotic heart disease of native coronary artery without angina pectoris: Secondary | ICD-10-CM

## 2016-10-18 DIAGNOSIS — I4819 Other persistent atrial fibrillation: Secondary | ICD-10-CM

## 2016-10-18 LAB — CUP PACEART INCLINIC DEVICE CHECK
Battery Remaining Longevity: 109 mo
Battery Voltage: 3.01 V
Brady Statistic RV Percent Paced: 92 %
Date Time Interrogation Session: 20180807144801
Implantable Lead Implant Date: 20170829
Implantable Lead Location: 753860
Lead Channel Impedance Value: 462.5 Ohm
Lead Channel Impedance Value: 625 Ohm
Lead Channel Pacing Threshold Amplitude: 1.25 V
Lead Channel Pacing Threshold Pulse Width: 0.4 ms
Lead Channel Setting Pacing Amplitude: 2.5 V
Lead Channel Setting Sensing Sensitivity: 2 mV
MDC IDC LEAD IMPLANT DT: 20170829
MDC IDC LEAD LOCATION: 753859
MDC IDC MSMT LEADCHNL RA SENSING INTR AMPL: 5 mV
MDC IDC MSMT LEADCHNL RV PACING THRESHOLD AMPLITUDE: 1 V
MDC IDC MSMT LEADCHNL RV PACING THRESHOLD PULSEWIDTH: 0.4 ms
MDC IDC MSMT LEADCHNL RV SENSING INTR AMPL: 12 mV
MDC IDC PG IMPLANT DT: 20170829
MDC IDC SET LEADCHNL RA PACING AMPLITUDE: 2 V
MDC IDC SET LEADCHNL RV PACING PULSEWIDTH: 0.4 ms
MDC IDC STAT BRADY RA PERCENT PACED: 46 %
Pulse Gen Model: 2272
Pulse Gen Serial Number: 7945290

## 2016-10-18 NOTE — Patient Instructions (Signed)
Medication Instructions: - Your physician recommends that you continue on your current medications as directed. Please refer to the Current Medication list given to you today.  Labwork: - Your physician recommends that you have lab work today: CBC  Procedures/Testing: - Your physician has requested that you have an echocardiogram. Echocardiography is a painless test that uses sound waves to create images of your heart. It provides your doctor with information about the size and shape of your heart and how well your heart's chambers and valves are working. This procedure takes approximately one hour. There are no restrictions for this procedure.  Follow-Up: - Your physician recommends that you schedule a follow-up appointment in: 3 months with Dr. Caryl Comes  Any Additional Special Instructions Will Be Listed Below (If Applicable).     If you need a refill on your cardiac medications before your next appointment, please call your pharmacy.

## 2016-10-18 NOTE — Progress Notes (Signed)
ELECTROPHYSIOLOGY OFFICE  NOTE  Patient ID: Dawn Garcia, MRN: [id number], DOB/AGE: 1940-04-12 76 y.o. Admit date: (Not on file) Date of Consult: 10/18/2016  Primary Physician: Glean Hess, MD Primary Cardiologist: tg       HPI Dawn Garcia is a 76 y.o. female  Seen in followup for pacing by Dr. 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 This was complicated by right atrial lead dislodgment requiring revision.  Because of recurrent atrial fibrillation he started her on dronaderone in the fall which was complicated by diarrhea. It was her impression however that prior to its discontinuation it had been associated with a marked improvement in exercise tolerance, allowing her to walk a flight of stairs without stopping for example.  She has a history of a prior PCI.     DATE TEST    6/17    Echo   EF 65 %   11/17    Myoview   EF 54 % Anteroapical defect         Shortness of breath is improved particularly with the increase in diuretics and more recently by the addition of diltiazem. Atrial fibrillation burden is about the same.  She's not had chest discomfort; she does not recall whether she had chest discomfort prior to her PCI or whether it manifested mostly as shortness of breath. She is complaining of significant fatigue in her legs as she tries to exercise.  She has treated sleep apnea  She's had her as she adjusted and she is feeling good deal better.      CHA2DS2/VAS Stroke Risk Points  6       1 Has Congestive Heart Failure:  Yes   1 Has Vascular Disease:  Yes   0 Has Hypertension:  No   2 Age:  50   1 Has Diabetes:  Yes   0 Had Stroke:  No Had TIA:  No Had thromboembolism:  No   1 Female:  Yes     No recent his warfarin in your blood count checked solution every 12 hours earlier not anemicCBC      Past Medical History:  Diagnosis Date  . Atrial fibrillation ([place])   . CAD (coronary artery disease)   . Cancer ([place])    skin  . CHF (congestive heart  failure) (HCC)     A-Fib  . COPD (chronic obstructive pulmonary disease) ([place])   . Diabetes mellitus without complication ([place])   . GERD (gastroesophageal reflux disease)   . Hyperlipidemia   . Hypocalcemia   . Hypokalemia   . Macular degeneration   . Myocardial infarction ([place])   . 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 Sinus node dysfunction (HCC)   . Thyroid disease   . Vitamin B12 deficiency   . Vitamin D deficiency       Surgical History:  Past Surgical History:  Procedure Laterality Date  . CHOLECYSTECTOMY    . COLONOSCOPY  2015   1 polyp removed- repeat 3 years  . CORONARY ANGIOPLASTY WITH STENT PLACEMENT    . EP IMPLANTABLE DEVICE N/A 11/10/2015   Procedure: Pacemaker Implant;  Surgeon: Will [name], MD;  Location: [facility];  Service: Cardiovascular;  Laterality: N/A;  . EP IMPLANTABLE DEVICE N/A 11/11/2015   Procedure: Lead Revision/Repair;  Surgeon: Evans Lance, MD;  Location: [facility];  Service: Cardiovascular;  Laterality: N/A;  . parathyroid surgery     x 2  . THYROID SURGERY    .  TOTAL SHOULDER ARTHROPLASTY Right    x 2  . VAGINAL HYSTERECTOMY       Home Meds: Prior to Admission medications   Medication Sig Start Date End Date Taking? Authorizing Provider  albuterol (PROVENTIL HFA;VENTOLIN HFA) 108 (90 Base) MCG/ACT inhaler Inhale 2 puffs into the lungs every 6 (six) hours as needed for wheezing or shortness of breath.   Yes Historical Provider, MD  ARTIFICIAL TEAR OP Place 1 drop into both eyes as needed (for dry eyes).   Yes Historical Provider, MD  Cholecalciferol (VITAMIN D3) 5000 units TABS Take 1 tablet by mouth every morning.   Yes Historical Provider, MD  furosemide (LASIX) 20 MG tablet Take 60 mg by mouth every morning.   Yes Historical Provider, MD  isosorbide mononitrate (IMDUR) 30 MG 24 hr tablet Take 1 tablet (30 mg total) by mouth daily. 02/11/16  Yes Will [name], MD  levothyroxine (SYNTHROID,  LEVOTHROID) 150 MCG tablet Take 1 tablet (150 mcg total) by mouth daily. 12/08/15  Yes Glean Hess, MD  Melatonin 5 MG TABS Take 1 tablet by mouth at bedtime.   Yes Historical Provider, MD  metFORMIN (GLUCOPHAGE) 500 MG tablet Take 1 tablet (500 mg total) by mouth 2 (two) times daily with a meal. 09/16/15  Yes Adline Potter, MD  metoprolol succinate (TOPROL XL) 25 MG 24 hr tablet Take 1 tablet (25 mg total) by mouth daily. 02/11/16  Yes Will [name], MD  nitroGLYCERIN (NITROSTAT) 0.4 MG SL tablet Place 0.4 mg under the tongue every 5 (five) minutes as needed for chest pain.   Yes Historical Provider, MD  pantoprazole (PROTONIX) 40 MG tablet Take 1 tablet (40 mg total) by mouth daily. 10/16/15  Yes Juline Patch, MD  potassium chloride (KLOR-CON) 8 MEQ tablet Take 1 tablet (8 mEq total) by mouth 2 (two) times daily. 12/21/15  Yes Glean Hess, MD  rosuvastatin (CRESTOR) 40 MG tablet Take 1 tablet (40 mg total) by mouth daily. 09/18/15  Yes Minna Merritts, MD  sitaGLIPtin (JANUVIA) 50 MG tablet Take 50 mg by mouth daily.   Yes Historical Provider, MD  vitamin B-12 (CYANOCOBALAMIN) 500 MCG tablet Take 500 mcg by mouth daily.   Yes Historical Provider, MD  warfarin (COUMADIN) 7.5 MG tablet Take 1 tablet (7.5 mg total) by mouth daily. 01/20/16  Yes Minna Merritts, MD    Allergies:  Allergies  Allergen Reactions  . Demerol [Meperidine] Anaphylaxis    Tolerated Fentanyl 11/10/15  . Multaq [Dronedarone] Diarrhea  . Sulfa Antibiotics Anaphylaxis  . Tetracyclines & Related Other (See Comments)    Made nose, lips,  And tongue itchy  . Metformin And Related Diarrhea    Social History   Social History  . Marital status: Married    Spouse name: N/A  . Number of children: N/A  . Years of education: N/A   Occupational History  . Retired    Social History Main Topics  . Smoking status: Never Smoker  . Smokeless tobacco: Never Used  . Alcohol use 0.0 oz/week  . Drug use: No  .  Sexual activity: Not on file   Other Topics Concern  . Not on file   Social History Narrative   Lives in [place] w/ husband.     Family History  Problem Relation Age of Onset  . Thyroid disease Mother 22       3 days after surgery  . Heart defect Father 26       ?  ascending aortic aneurysm     ROS:  Please see the history of present illness.     All other systems reviewed and negative.   Well developed and nourished in no acute distress HENT normal Neck supple with JVP-6 Carotids brisk and full without bruits Clear Regular rate and rhythm, no murmurs or gallops Abd-soft with active BS without hepatomegaly No Clubbing cyanosis tr edema Skin-warm and dry A & Oriented  Grossly normal sensory and motor function     Labs: Cardiac Enzymes No results for input(s): CKTOTAL, CKMB, TROPONINI in the last 72 hours. CBC Lab Results  Component Value Date   WBC 8.5 11/12/2015   HGB 12.7 11/12/2015   HCT 39.1 11/12/2015   MCV 88.9 11/12/2015   PLT 154 11/12/2015   PROTIME: No results for input(s): LABPROT, INR in the last 72 hours. Chemistry No results for input(s): NA, K, CL, CO2, BUN, CREATININE, CALCIUM, PROT, BILITOT, ALKPHOS, ALT, AST, GLUCOSE in the last 168 hours.  Invalid input(s): LABALBU Lipids Lab Results  Component Value Date   CHOL 127 04/19/2016   HDL 42 04/19/2016   LDLCALC 43 04/19/2016   TRIG 209 (H) 04/19/2016   BNP No results found for: PROBNP Thyroid Function Tests: No results for input(s): TSH, T4TOTAL, T3FREE, THYROIDAB in the last 72 hours.  Invalid input(s): FREET3 Miscellaneous No results found for: DDIMER  Radiology/Studies:  No results found.  EKG:AV pacing   Assessment and Plan:  Sick Sinus syndrome  Persistent Atrial Fib with prior PVI  Pacemaker [employer]   OSA  P-wave oversensing resulting false detection of atrial fibrillation  HFpEF acute episodic  Coronary artery disease with prior stenting  Renal  insufficiency-worsening grade 4  Without symptoms of ischemia Although her dyspnea may be related to this.   Atrial fibrillation burden is unchanged on the diltiazem; the fact that she feels better suggested maybe some rate control benefit. She had been able to correlate atrial fibrillation associated with worsening dyspnea.  The fatigue that she is feeling with ambulation may be a manifestation of heart failure. We will check her echocardiogram. It may also be a manifestation of ischemia and if her ECG is unrevealing, will discuss with Dr. Rockey Situ as to whether repeat catheterization would be of value especially given the poor sensitivity and specificity of the Myoview scanning.  She has not had her CBC checked on anticoagulation. We will do that today.   She continues to have 6 episodes per hour on her sleep apnea. I've asked her to follow-up with her pulmonologist.  Renal function for whatever reason is vastly improved.      Virl Axe

## 2016-10-19 LAB — CBC WITH DIFFERENTIAL/PLATELET
BASOS: 0 %
Basophils Absolute: 0 10*3/uL (ref 0.0–0.2)
EOS (ABSOLUTE): 0.5 10*3/uL — AB (ref 0.0–0.4)
Eos: 6 %
Hematocrit: 39.6 % (ref 34.0–46.6)
Hemoglobin: 13.4 g/dL (ref 11.1–15.9)
IMMATURE GRANULOCYTES: 0 %
Immature Grans (Abs): 0 10*3/uL (ref 0.0–0.1)
Lymphocytes Absolute: 1.6 10*3/uL (ref 0.7–3.1)
Lymphs: 23 %
MCH: 27.6 pg (ref 26.6–33.0)
MCHC: 33.8 g/dL (ref 31.5–35.7)
MCV: 82 fL (ref 79–97)
MONOS ABS: 0.7 10*3/uL (ref 0.1–0.9)
Monocytes: 10 %
NEUTROS ABS: 4.4 10*3/uL (ref 1.4–7.0)
NEUTROS PCT: 61 %
PLATELETS: 213 10*3/uL (ref 150–379)
RBC: 4.85 x10E6/uL (ref 3.77–5.28)
RDW: 16.3 % — ABNORMAL HIGH (ref 12.3–15.4)
WBC: 7.2 10*3/uL (ref 3.4–10.8)

## 2016-10-20 ENCOUNTER — Telehealth: Payer: Self-pay

## 2016-10-20 NOTE — Telephone Encounter (Signed)
Patient informed to call and make OV to be seen for symptoms.

## 2016-10-20 NOTE — Telephone Encounter (Signed)
She may have a bladder infection or some other problems that is causing both her fatigue and the blood sugars to rise.  I suggest an OV.

## 2016-10-20 NOTE — Telephone Encounter (Signed)
Patient called stating she has been feeling extremely fatigued and exhausted. Checked her sugar yesterday and it was 269 three hours after she ate. She states she also knows she has a heart condition but is even more than usually tired. Should patient make OV? Please Advise.

## 2016-11-01 ENCOUNTER — Ambulatory Visit (INDEPENDENT_AMBULATORY_CARE_PROVIDER_SITE_OTHER): Payer: Medicare Other

## 2016-11-01 ENCOUNTER — Other Ambulatory Visit: Payer: Self-pay

## 2016-11-01 DIAGNOSIS — I481 Persistent atrial fibrillation: Secondary | ICD-10-CM | POA: Diagnosis not present

## 2016-11-01 DIAGNOSIS — I4819 Other persistent atrial fibrillation: Secondary | ICD-10-CM

## 2016-11-21 ENCOUNTER — Ambulatory Visit (INDEPENDENT_AMBULATORY_CARE_PROVIDER_SITE_OTHER): Payer: Medicare Other

## 2016-11-21 DIAGNOSIS — Z7189 Other specified counseling: Secondary | ICD-10-CM

## 2016-11-21 DIAGNOSIS — Z7901 Long term (current) use of anticoagulants: Secondary | ICD-10-CM | POA: Diagnosis not present

## 2016-11-21 DIAGNOSIS — I480 Paroxysmal atrial fibrillation: Secondary | ICD-10-CM | POA: Diagnosis not present

## 2016-11-21 DIAGNOSIS — I4891 Unspecified atrial fibrillation: Secondary | ICD-10-CM

## 2016-11-21 LAB — POCT INR: INR: 5.7

## 2016-11-29 ENCOUNTER — Encounter: Payer: Self-pay | Admitting: Internal Medicine

## 2016-11-29 ENCOUNTER — Ambulatory Visit (INDEPENDENT_AMBULATORY_CARE_PROVIDER_SITE_OTHER): Payer: Medicare Other | Admitting: Internal Medicine

## 2016-11-29 VITALS — BP 128/64 | HR 60 | Ht 63.0 in | Wt 210.0 lb

## 2016-11-29 DIAGNOSIS — E1121 Type 2 diabetes mellitus with diabetic nephropathy: Secondary | ICD-10-CM | POA: Diagnosis not present

## 2016-11-29 DIAGNOSIS — R197 Diarrhea, unspecified: Secondary | ICD-10-CM | POA: Diagnosis not present

## 2016-11-29 DIAGNOSIS — I2510 Atherosclerotic heart disease of native coronary artery without angina pectoris: Secondary | ICD-10-CM | POA: Diagnosis not present

## 2016-11-29 DIAGNOSIS — Z23 Encounter for immunization: Secondary | ICD-10-CM

## 2016-11-29 MED ORDER — CHOLESTYRAMINE LIGHT 4 G PO PACK
4.0000 g | PACK | Freq: Two times a day (BID) | ORAL | 0 refills | Status: DC
Start: 2016-11-29 — End: 2017-01-09

## 2016-11-29 NOTE — Patient Instructions (Signed)
Try Dawn Garcia twice a day in fluids for one month to see if it controls diarrhea.  If persistent and labs are normal, will refer to GI.

## 2016-11-29 NOTE — Progress Notes (Signed)
Date:  11/29/2016   Name:  Dawn Garcia   DOB:  [birth year]   MRN:  [id number]   Chief Complaint: Diabetes (BS- 132) and Immunizations (FLU SHOT- 65+) Diabetes  She presents for her follow-up diabetic visit. She has type 2 diabetes mellitus. There are no hypoglycemic associated symptoms. Pertinent negatives for diabetes include no fatigue. Symptoms are stable. Current diabetic treatment includes oral agent (dual therapy). She is compliant with treatment all of the time.  Stopped metformin a month ago - stools decreased from 10 to 3 per day.  Stools still liquid however.  She is taking probiotics. She is status post cholecystectomy. She has no history of bowel resection. No history of pancreatitis. Last colonoscopy was about 3 years ago and showed one polyp. Review of medications shows no likely culprit. In addition, there has been no weight loss, blood in the stool, nausea, or vomiting.  Lab Results  Component Value Date   HGBA1C 8.1 (H) 09/05/2016      Review of Systems  Constitutional: Negative for chills, fatigue and unexpected weight change.  HENT: Negative for trouble swallowing.   Respiratory: Negative for choking, chest tightness and shortness of breath.   Gastrointestinal: Positive for abdominal pain and diarrhea. Negative for abdominal distention, anal bleeding and rectal pain.  Genitourinary: Negative for dysuria.  Skin: Negative for rash.    Patient Active Problem List   Diagnosis Date Noted  . Lipoma of neck 09/05/2016  . 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 Bunion of great toe 09/05/2016  . Hyperlipidemia 09/18/2015  . Symptomatic bradycardia 09/18/2015  . Coronary artery disease 09/10/2015  . B12 deficiency 09/10/2015  . History of melanoma 09/09/2015  . COPD (chronic obstructive pulmonary disease) ([place]) 09/09/2015  . Hypothyroidism following radioiodine therapy 09/09/2015  . Encounter for anticoagulation discussion  and counseling 08/26/2015  . Obesity, Class II, BMI 35-39.9 08/20/2015  . GERD with stricture 08/20/2015  . Macular degeneration 08/20/2015  . Diastolic CHF ([place]) 19/50/9326  . History of parathyroidectomy 08/20/2015  . CKD (chronic kidney disease) stage 3, GFR 30-59 ml/min 08/20/2015  . PAF (paroxysmal atrial fibrillation) ([place]) 08/19/2015  . Controlled type 2 diabetes mellitus with diabetic nephropathy, without long-term current use of insulin ([place]) 08/19/2015  . Primary gout 08/19/2015    Prior to Admission medications   Medication Sig Start Date End Date Taking? Authorizing Provider  albuterol (PROVENTIL HFA;VENTOLIN HFA) 108 (90 Base) MCG/ACT inhaler Inhale 2 puffs into the lungs every 6 (six) hours as needed for wheezing or shortness of breath.   Yes [provider]  Alpha-Lipoic Acid 200 MG CAPS Take by mouth.   Yes [provider]  ARTIFICIAL TEAR OP Place 1 drop into both eyes as needed (for dry eyes).   Yes [provider]  Biotin 1000 MCG CHEW Chew by mouth.   Yes [provider]  Cholecalciferol (VITAMIN D3) 5000 units TABS Take 1 tablet by mouth every morning.   Yes [provider]  diltiazem (CARDIZEM CD) 120 MG 24 hr capsule Take 1 capsule (120 mg total) by mouth daily. 10/03/16  Yes Gollan, Kathlene November, MD  empagliflozin (JARDIANCE) 10 MG TABS tablet Take 10 mg by mouth daily. 10/17/16  Yes Glean Hess, MD  furosemide (LASIX) 20 MG tablet Take 5 tablets (100 mg total) by mouth daily. 09/05/16 12/04/16 Yes Glean Hess, MD  glimepiride (AMARYL) 2 MG tablet Take 1 tablet (2 mg total) by mouth daily with  breakfast. 10/17/16  Yes Glean Hess, MD  levothyroxine (SYNTHROID, LEVOTHROID) 150 MCG tablet Take 1 tablet (150 mcg total) by mouth daily. 09/05/16  Yes Glean Hess, MD  Melatonin 5 MG TABS Take 1 tablet by mouth at bedtime.   Yes [provider]  Multiple Vitamins-Minerals (ICAPS AREDS 2 PO) Take by mouth.    Yes [provider]  nitroGLYCERIN (NITROSTAT) 0.4 MG SL tablet Place 0.4 mg under the tongue every 5 (five) minutes as needed for chest pain.   Yes [provider]  Omega-3 Fatty Acids (FISH OIL) 1200 MG CAPS Take by mouth.   Yes [provider]  pantoprazole (PROTONIX) 40 MG tablet Take 1 tablet (40 mg total) by mouth daily. 09/05/16  Yes Glean Hess, MD  potassium chloride (KLOR-CON) 8 MEQ tablet TAKE ONE TABLET BY MOUTH TWICE DAILY 09/18/16  Yes Glean Hess, MD  rosuvastatin (CRESTOR) 40 MG tablet Take 1 tablet (40 mg total) by mouth daily. 06/15/16  Yes Gollan, Kathlene November, MD  vitamin B-12 (CYANOCOBALAMIN) 500 MCG tablet Take 500 mcg by mouth daily.   Yes [provider]  warfarin (COUMADIN) 7.5 MG tablet Take as directed by coumadin clinic 07/26/16  Yes Gollan, Kathlene November, MD    Allergies  Allergen Reactions  . Demerol [Meperidine] Anaphylaxis    Tolerated Fentanyl 11/10/15  . Multaq [Dronedarone] Diarrhea  . Sulfa Antibiotics Anaphylaxis  . Tetracyclines & Related Other (See Comments)    Made nose, lips,  And tongue itchy  . Metformin And Related Diarrhea    Past Surgical History:  Procedure Laterality Date  . CHOLECYSTECTOMY    . COLONOSCOPY  2015   1 polyp removed- repeat 3 years  . CORONARY ANGIOPLASTY WITH STENT PLACEMENT    . EP IMPLANTABLE DEVICE N/A 11/10/2015   Procedure: Pacemaker Implant;  Surgeon: Will [name], MD;  Location: [facility];  Service: Cardiovascular;  Laterality: N/A;  . EP IMPLANTABLE DEVICE N/A 11/11/2015   Procedure: Lead Revision/Repair;  Surgeon: Evans Lance, MD;  Location: [facility];  Service: Cardiovascular;  Laterality: N/A;  . parathyroid surgery     x 2  . THYROID SURGERY    . TOTAL SHOULDER ARTHROPLASTY Right    x 2  . VAGINAL HYSTERECTOMY      Social History  Substance Use Topics  . Smoking status: Never Smoker  . Smokeless tobacco: Never Used  . Alcohol use 0.0  oz/week     Medication list has been reviewed and updated.  PHQ 2/9 Scores 05/02/2016 12/08/2015 09/09/2015  PHQ - 2 Score 0 0 0    Physical Exam  Constitutional: She is oriented to person, place, and time. She appears well-developed. No distress.  HENT:  Head: Normocephalic and atraumatic.  Cardiovascular: Normal rate, regular rhythm and normal heart sounds.   Pulmonary/Chest: Effort normal and breath sounds normal. No respiratory distress. She has no wheezes.  Abdominal: Soft. Normal appearance and bowel sounds are normal. There is no tenderness.  Musculoskeletal: Normal range of motion.  Neurological: She is alert and oriented to person, place, and time.  Skin: Skin is warm and dry. No rash noted.  Psychiatric: She has a normal mood and affect. Her speech is normal and behavior is normal. Thought content normal.  Nursing note and vitals reviewed.   BP 128/64   Pulse 60   Ht 5\' 3"  (1.6 m)   Wt 210 lb (95.3 kg)   SpO2 98%  BMI 37.20 kg/m   Assessment and Plan: 1. Controlled type 2 diabetes mellitus with diabetic nephropathy, without long-term current use of insulin ([place]) Continue current therapy - may have trouble affording Jardiance once she hits the donut hole in the near future - Hemoglobin A1c - Comprehensive metabolic panel  2. Diarrhea, unspecified type Rule pancreatic insufficiency; consider bile acids contributing s/p cholecystectomy If no improvement and labs normal, will refer to GI - Comprehensive metabolic panel - cholestyramine light (PREVALITE) 4 g packet; Take 1 packet (4 g total) by mouth 2 (two) times daily.  Dispense: 60 packet; Refill: 0  3. Need for influenza vaccination - Flu vaccine HIGH DOSE PF   Meds ordered this encounter  Medications  . cholestyramine light (PREVALITE) 4 g packet    Sig: Take 1 packet (4 g total) by mouth 2 (two) times daily.    Dispense:  60 packet    Refill:  0    Partially dictated using Editor, commissioning. Any errors  are unintentional.  Halina Maidens, MD [facility]  11/29/2016

## 2016-11-30 LAB — COMPREHENSIVE METABOLIC PANEL
A/G RATIO: 1.8 (ref 1.2–2.2)
ALBUMIN: 4.4 g/dL (ref 3.5–4.8)
ALK PHOS: 211 IU/L — AB (ref 39–117)
ALT: 25 IU/L (ref 0–32)
AST: 28 IU/L (ref 0–40)
BILIRUBIN TOTAL: 0.4 mg/dL (ref 0.0–1.2)
BUN / CREAT RATIO: 15 (ref 12–28)
BUN: 17 mg/dL (ref 8–27)
CHLORIDE: 102 mmol/L (ref 96–106)
CO2: 22 mmol/L (ref 20–29)
Calcium: 9 mg/dL (ref 8.7–10.3)
Creatinine, Ser: 1.15 mg/dL — ABNORMAL HIGH (ref 0.57–1.00)
GFR calc Af Amer: 54 mL/min/{1.73_m2} — ABNORMAL LOW (ref >59)
GFR calc non Af Amer: 47 mL/min/{1.73_m2} — ABNORMAL LOW (ref >59)
Globulin, Total: 2.5 g/dL (ref 1.5–4.5)
Glucose: 204 mg/dL — ABNORMAL HIGH (ref 65–99)
Potassium: 3.5 mmol/L (ref 3.5–5.2)
Sodium: 144 mmol/L (ref 134–144)
TOTAL PROTEIN: 6.9 g/dL (ref 6.0–8.5)

## 2016-11-30 LAB — HEMOGLOBIN A1C
Est. average glucose Bld gHb Est-mCnc: 177 mg/dL
Hgb A1c MFr Bld: 7.8 % — ABNORMAL HIGH (ref 4.8–5.6)

## 2016-12-05 ENCOUNTER — Ambulatory Visit (INDEPENDENT_AMBULATORY_CARE_PROVIDER_SITE_OTHER): Payer: Medicare Other

## 2016-12-05 DIAGNOSIS — I480 Paroxysmal atrial fibrillation: Secondary | ICD-10-CM

## 2016-12-05 DIAGNOSIS — I4891 Unspecified atrial fibrillation: Secondary | ICD-10-CM

## 2016-12-05 DIAGNOSIS — Z7189 Other specified counseling: Secondary | ICD-10-CM | POA: Diagnosis not present

## 2016-12-05 DIAGNOSIS — Z7901 Long term (current) use of anticoagulants: Secondary | ICD-10-CM | POA: Diagnosis not present

## 2016-12-05 LAB — POCT INR: INR: 3.8

## 2016-12-08 DIAGNOSIS — H353112 Nonexudative age-related macular degeneration, right eye, intermediate dry stage: Secondary | ICD-10-CM | POA: Diagnosis not present

## 2016-12-08 DIAGNOSIS — H35433 Paving stone degeneration of retina, bilateral: Secondary | ICD-10-CM | POA: Diagnosis not present

## 2016-12-08 DIAGNOSIS — H353221 Exudative age-related macular degeneration, left eye, with active choroidal neovascularization: Secondary | ICD-10-CM | POA: Diagnosis not present

## 2016-12-08 DIAGNOSIS — H35033 Hypertensive retinopathy, bilateral: Secondary | ICD-10-CM | POA: Diagnosis not present

## 2016-12-20 ENCOUNTER — Ambulatory Visit (INDEPENDENT_AMBULATORY_CARE_PROVIDER_SITE_OTHER): Payer: Medicare Other | Admitting: *Deleted

## 2016-12-20 DIAGNOSIS — I495 Sick sinus syndrome: Secondary | ICD-10-CM

## 2016-12-20 NOTE — Progress Notes (Signed)
Remote pacemaker transmission.   

## 2016-12-23 ENCOUNTER — Encounter: Payer: Self-pay | Admitting: Cardiology

## 2016-12-26 ENCOUNTER — Ambulatory Visit (INDEPENDENT_AMBULATORY_CARE_PROVIDER_SITE_OTHER): Payer: Medicare Other

## 2016-12-26 DIAGNOSIS — Z7189 Other specified counseling: Secondary | ICD-10-CM

## 2016-12-26 DIAGNOSIS — I4891 Unspecified atrial fibrillation: Secondary | ICD-10-CM

## 2016-12-26 DIAGNOSIS — Z7901 Long term (current) use of anticoagulants: Secondary | ICD-10-CM | POA: Diagnosis not present

## 2016-12-26 DIAGNOSIS — I480 Paroxysmal atrial fibrillation: Secondary | ICD-10-CM | POA: Diagnosis not present

## 2016-12-26 LAB — POCT INR: INR: 4.5

## 2017-01-06 ENCOUNTER — Encounter: Payer: Self-pay | Admitting: Cardiology

## 2017-01-09 ENCOUNTER — Ambulatory Visit (INDEPENDENT_AMBULATORY_CARE_PROVIDER_SITE_OTHER): Payer: Medicare Other

## 2017-01-09 ENCOUNTER — Ambulatory Visit (INDEPENDENT_AMBULATORY_CARE_PROVIDER_SITE_OTHER): Payer: Medicare Other | Admitting: Internal Medicine

## 2017-01-09 ENCOUNTER — Encounter: Payer: Self-pay | Admitting: Internal Medicine

## 2017-01-09 VITALS — BP 110/78 | HR 60 | Resp 16 | Ht 63.0 in | Wt 211.0 lb

## 2017-01-09 DIAGNOSIS — I4891 Unspecified atrial fibrillation: Secondary | ICD-10-CM | POA: Diagnosis not present

## 2017-01-09 DIAGNOSIS — Z7189 Other specified counseling: Secondary | ICD-10-CM

## 2017-01-09 DIAGNOSIS — E1121 Type 2 diabetes mellitus with diabetic nephropathy: Secondary | ICD-10-CM | POA: Diagnosis not present

## 2017-01-09 DIAGNOSIS — I2510 Atherosclerotic heart disease of native coronary artery without angina pectoris: Secondary | ICD-10-CM

## 2017-01-09 DIAGNOSIS — I480 Paroxysmal atrial fibrillation: Secondary | ICD-10-CM | POA: Diagnosis not present

## 2017-01-09 DIAGNOSIS — Z7901 Long term (current) use of anticoagulants: Secondary | ICD-10-CM

## 2017-01-09 DIAGNOSIS — R197 Diarrhea, unspecified: Secondary | ICD-10-CM | POA: Diagnosis not present

## 2017-01-09 LAB — POCT INR: INR: 2.6

## 2017-01-09 MED ORDER — EMPAGLIFLOZIN 10 MG PO TABS: 10.0000 mg | ORAL_TABLET | Freq: Every day | ORAL | 3 refills | Status: DC

## 2017-01-09 MED ORDER — GLIMEPIRIDE 2 MG PO TABS: 2.0000 mg | ORAL_TABLET | Freq: Every day | ORAL | 3 refills | Status: DC

## 2017-01-09 NOTE — Progress Notes (Signed)
Date:  01/09/2017   Name:  Dawn Garcia   DOB:  Jul 28, 1940   MRN:  [id number]   Chief Complaint: Diabetes (ranges 150-180) Diabetes  She presents for her follow-up diabetic visit. She has type 2 diabetes mellitus. Pertinent negatives for hypoglycemia include no dizziness, headaches or tremors. Pertinent negatives for diabetes include no chest pain, no fatigue and no weakness. Current diabetic treatment includes oral agent (dual therapy).  Diarrhea   This is a chronic problem. The problem occurs 2 to 4 times per day. The problem has been waxing and waning. The stool consistency is described as watery. Associated symptoms include bloating and increased flatus. Pertinent negatives include no abdominal pain, chills, fever or headaches.  Insomnia  Primary symptoms: sleep disturbance, difficulty falling asleep.  The onset quality is undetermined. The problem has been rapidly worsening (over past few weeks) since onset. The symptoms are aggravated by family issues (may be stressed planning a surprise birthday party for her husband). Past treatments include medication (melatonin, otc sleep aid). The treatment provided no relief.  She was unable to take bid questran.  Stools are intermittently soft and then liquid.  Between 3-5 times per day.  No bleeding noted. Last colonoscopy was in 2015 in [place].  She has stopped metformin and all supplements and stool frequency decreased about 50% but has not normalized over the past 2 months.  Lab Results  Component Value Date   HGBA1C 7.8 (H) 11/29/2016     Review of Systems  Constitutional: Negative for chills, fatigue and fever.  Respiratory: Negative for chest tightness and shortness of breath.   Cardiovascular: Negative for chest pain.  Gastrointestinal: Positive for bloating, diarrhea and flatus. Negative for abdominal pain, blood in stool and rectal pain.  Neurological: Negative for dizziness, tremors, weakness and headaches.    Psychiatric/Behavioral: Positive for sleep disturbance. Negative for dysphoric mood and hallucinations. The patient has insomnia.     Patient Active Problem List   Diagnosis Date Noted  . Lipoma of neck 09/05/2016  . 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 Bunion of great toe 09/05/2016  . Hyperlipidemia 09/18/2015  . Symptomatic bradycardia 09/18/2015  . Coronary artery disease 09/10/2015  . B12 deficiency 09/10/2015  . History of melanoma 09/09/2015  . COPD (chronic obstructive pulmonary disease) ([place]) 09/09/2015  . Hypothyroidism following radioiodine therapy 09/09/2015  . Encounter for anticoagulation discussion and counseling 08/26/2015  . Obesity, Class II, BMI 35-39.9 08/20/2015  . GERD with stricture 08/20/2015  . Macular degeneration 08/20/2015  . Diastolic CHF ([place]) 01/75/1025  . History of parathyroidectomy 08/20/2015  . CKD (chronic kidney disease) stage 3, GFR 30-59 ml/min ([facility]CC) 08/20/2015  . PAF (paroxysmal atrial fibrillation) ([place]) 08/19/2015  . Controlled type 2 diabetes mellitus with diabetic nephropathy, without long-term current use of insulin ([place]) 08/19/2015  . Primary gout 08/19/2015    Prior to Admission medications   Medication Sig Start Date End Date Taking? Authorizing Provider  albuterol (PROVENTIL HFA;VENTOLIN HFA) 108 (90 Base) MCG/ACT inhaler Inhale 2 puffs into the lungs every 6 (six) hours as needed for wheezing or shortness of breath.    [provider]  Alpha-Lipoic Acid 200 MG CAPS Take by mouth.    [provider]  ARTIFICIAL TEAR OP Place 1 drop into both eyes as needed (for dry eyes).    [provider]  Biotin 1000 MCG CHEW Chew by mouth.    [provider]  Cholecalciferol (VITAMIN D3) 5000 units  TABS Take 1 tablet by mouth every morning.    [provider]  cholestyramine light (PREVALITE) 4 g packet Take 1 packet (4 g total) by mouth 2 (two) times daily.  11/29/16   Glean Hess, MD  diltiazem (CARDIZEM CD) 120 MG 24 hr capsule Take 1 capsule (120 mg total) by mouth daily. 10/03/16   Minna Merritts, MD  empagliflozin (JARDIANCE) 10 MG TABS tablet Take 10 mg by mouth daily. 10/17/16   Glean Hess, MD  furosemide (LASIX) 20 MG tablet Take 5 tablets (100 mg total) by mouth daily. 09/05/16 12/04/16  Glean Hess, MD  glimepiride (AMARYL) 2 MG tablet Take 1 tablet (2 mg total) by mouth daily with breakfast. 10/17/16   Glean Hess, MD  levothyroxine (SYNTHROID, LEVOTHROID) 150 MCG tablet Take 1 tablet (150 mcg total) by mouth daily. 09/05/16   Glean Hess, MD  Melatonin 5 MG TABS Take 1 tablet by mouth at bedtime.    [provider]  Multiple Vitamins-Minerals (ICAPS AREDS 2 PO) Take by mouth.    [provider]  nitroGLYCERIN (NITROSTAT) 0.4 MG SL tablet Place 0.4 mg under the tongue every 5 (five) minutes as needed for chest pain.    [provider]  Omega-3 Fatty Acids (FISH OIL) 1200 MG CAPS Take by mouth.    [provider]  pantoprazole (PROTONIX) 40 MG tablet Take 1 tablet (40 mg total) by mouth daily. 09/05/16   Glean Hess, MD  potassium chloride (KLOR-CON) 8 MEQ tablet TAKE ONE TABLET BY MOUTH TWICE DAILY 09/18/16   Glean Hess, MD  rosuvastatin (CRESTOR) 40 MG tablet Take 1 tablet (40 mg total) by mouth daily. 06/15/16   Minna Merritts, MD  vitamin B-12 (CYANOCOBALAMIN) 500 MCG tablet Take 500 mcg by mouth daily.    [provider]  warfarin (COUMADIN) 7.5 MG tablet Take as directed by coumadin clinic 07/26/16   Minna Merritts, MD    Allergies  Allergen Reactions  . Demerol [Meperidine] Anaphylaxis    Tolerated Fentanyl 11/10/15  . Multaq [Dronedarone] Diarrhea  . Sulfa Antibiotics Anaphylaxis  . Tetracyclines & Related Other (See Comments) and Swelling    Made nose, lips,  And tongue itchy Made nose, lips,  And tongue itchy  . Metformin And Related Diarrhea      Past Surgical History:  Procedure Laterality Date  . CHOLECYSTECTOMY    . COLONOSCOPY  2015   1 polyp removed- repeat 3 years  . CORONARY ANGIOPLASTY WITH STENT PLACEMENT    . EP IMPLANTABLE DEVICE N/A 11/10/2015   Procedure: Pacemaker Implant;  Surgeon: Will [name], MD;  Location: [facility];  Service: Cardiovascular;  Laterality: N/A;  . EP IMPLANTABLE DEVICE N/A 11/11/2015   Procedure: Lead Revision/Repair;  Surgeon: Evans Lance, MD;  Location: [facility];  Service: Cardiovascular;  Laterality: N/A;  . parathyroid surgery     x 2  . THYROID SURGERY    . TOTAL SHOULDER ARTHROPLASTY Right    x 2  . VAGINAL HYSTERECTOMY      Social History  Substance Use Topics  . Smoking status: Never Smoker  . Smokeless tobacco: Never Used  . Alcohol use 0.0 oz/week     Medication list has been reviewed and updated.  PHQ 2/9 Scores 05/02/2016 12/08/2015 09/09/2015  PHQ - 2 Score 0 0 0    Physical Exam  Constitutional: She is oriented to person, place, and time. She  appears well-developed. No distress.  HENT:  Head: Normocephalic and atraumatic.  Neck: Normal range of motion. Neck supple.  Cardiovascular: Normal rate, regular rhythm and normal heart sounds.   Pulmonary/Chest: Effort normal and breath sounds normal. No respiratory distress. She has no wheezes.  Musculoskeletal: She exhibits no edema.  Neurological: She is alert and oriented to person, place, and time.  Skin: Skin is warm and dry. No rash noted.  Psychiatric: She has a normal mood and affect. Her behavior is normal. Thought content normal.  Nursing note and vitals reviewed.   BP 110/78   Pulse 60   Resp 16   Ht 5\' 3"  (1.6 m)   Wt 211 lb (95.7 kg)   SpO2 95%   BMI 37.38 kg/m   Assessment and Plan: 1. Diarrhea, unspecified type - Ambulatory referral to Gastroenterology  2. Controlled type 2 diabetes mellitus with diabetic nephropathy, without long-term current use of insulin  (HCC) Continue current medication - glimepiride (AMARYL) 2 MG tablet; Take 1 tablet (2 mg total) by mouth daily with breakfast.  Dispense: 90 tablet; Refill: 3 - empagliflozin (JARDIANCE) 10 MG TABS tablet; Take 10 mg by mouth daily.  Dispense: 90 tablet; Refill: 3   Meds ordered this encounter  Medications  . glimepiride (AMARYL) 2 MG tablet    Sig: Take 1 tablet (2 mg total) by mouth daily with breakfast.    Dispense:  90 tablet    Refill:  3  . empagliflozin (JARDIANCE) 10 MG TABS tablet    Sig: Take 10 mg by mouth daily.    Dispense:  90 tablet    Refill:  3    Partially dictated using Editor, commissioning. Any errors are unintentional.  Halina Maidens, MD [facility]  01/09/2017

## 2017-01-10 LAB — CUP PACEART REMOTE DEVICE CHECK
Battery Remaining Longevity: 112 mo
Battery Remaining Percentage: 95.5 %
Battery Voltage: 3.01 V
Brady Statistic RA Percent Paced: 55 %
Brady Statistic RV Percent Paced: 93 %
Date Time Interrogation Session: 20181009110909
Implantable Lead Implant Date: 20170829
Implantable Lead Location: 753859
Implantable Pulse Generator Implant Date: 20170829
Lead Channel Impedance Value: 480 Ohm
Lead Channel Pacing Threshold Pulse Width: 0.4 ms
Lead Channel Sensing Intrinsic Amplitude: 4.7 mV
Lead Channel Setting Sensing Sensitivity: 2 mV
MDC IDC LEAD IMPLANT DT: 20170829
MDC IDC LEAD LOCATION: 753860
MDC IDC MSMT LEADCHNL RA PACING THRESHOLD AMPLITUDE: 1.25 V
MDC IDC MSMT LEADCHNL RV IMPEDANCE VALUE: 640 Ohm
MDC IDC MSMT LEADCHNL RV PACING THRESHOLD AMPLITUDE: 1 V
MDC IDC MSMT LEADCHNL RV PACING THRESHOLD PULSEWIDTH: 0.4 ms
MDC IDC MSMT LEADCHNL RV SENSING INTR AMPL: 12 mV
MDC IDC SET LEADCHNL RA PACING AMPLITUDE: 2.25 V
MDC IDC SET LEADCHNL RV PACING AMPLITUDE: 2.5 V
MDC IDC SET LEADCHNL RV PACING PULSEWIDTH: 0.4 ms
MDC IDC STAT BRADY AP VP PERCENT: 74 %
MDC IDC STAT BRADY AP VS PERCENT: 1 %
MDC IDC STAT BRADY AS VP PERCENT: 25 %
MDC IDC STAT BRADY AS VS PERCENT: 1 %
Pulse Gen Model: 2272
Pulse Gen Serial Number: 7945290

## 2017-01-11 DIAGNOSIS — L918 Other hypertrophic disorders of the skin: Secondary | ICD-10-CM | POA: Diagnosis not present

## 2017-01-11 DIAGNOSIS — L578 Other skin changes due to chronic exposure to nonionizing radiation: Secondary | ICD-10-CM | POA: Diagnosis not present

## 2017-01-11 DIAGNOSIS — B356 Tinea cruris: Secondary | ICD-10-CM | POA: Diagnosis not present

## 2017-01-18 ENCOUNTER — Encounter: Payer: Self-pay | Admitting: Internal Medicine

## 2017-01-18 ENCOUNTER — Ambulatory Visit (INDEPENDENT_AMBULATORY_CARE_PROVIDER_SITE_OTHER): Payer: Medicare Other | Admitting: Internal Medicine

## 2017-01-18 VITALS — BP 134/80 | HR 63 | Ht 63.0 in | Wt 212.5 lb

## 2017-01-18 DIAGNOSIS — I495 Sick sinus syndrome: Secondary | ICD-10-CM

## 2017-01-18 DIAGNOSIS — I480 Paroxysmal atrial fibrillation: Secondary | ICD-10-CM | POA: Diagnosis not present

## 2017-01-18 DIAGNOSIS — Z950 Presence of cardiac pacemaker: Secondary | ICD-10-CM | POA: Diagnosis not present

## 2017-01-18 DIAGNOSIS — I2510 Atherosclerotic heart disease of native coronary artery without angina pectoris: Secondary | ICD-10-CM

## 2017-01-18 NOTE — Patient Instructions (Signed)
Medication Instructions:  Your physician recommends that you continue on your current medications as directed. Please refer to the Current Medication list given to you today.   Labwork: none  Testing/Procedures: none  Follow-Up: Your physician wants you to follow-up in: one year with Dr. Caryl Comes.  You will receive a reminder letter in the mail two months in advance. If you don't receive a letter, please call our office to schedule the follow-up appointment.   Any Other Special Instructions Will Be Listed Below (If Applicable). Please call when you determine which oral anticoagulant you choose.     If you need a refill on your cardiac medications before your next appointment, please call your pharmacy.

## 2017-01-18 NOTE — Progress Notes (Signed)
ELECTROPHYSIOLOGY OFFICE  NOTE  Patient ID: Dawn Garcia, MRN: [id number], DOB/AGE: 1940-10-11 76 y.o. Admit date: (Not on file) Date of Consult: 01/18/2017  Primary Physician: Glean Hess, MD Primary Cardiologist: tg       HPI Dawn Garcia is a 76 y.o. female  Seen in followup for pacing by Dr. 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 This was complicated by right atrial lead dislodgment requiring revision.  Because of recurrent atrial fibrillation he started her on dronaderone in the fall which was complicated by diarrhea. It was her impression however that prior to its discontinuation it had been associated with a marked improvement in exercise tolerance, allowing her to walk a flight of stairs without stopping for example.  She has a history of a prior PCI.     DATE TEST    6/17    Echo   EF 65 %   11/17    Myoview   EF 54 % Anteroapical defect         Shortness of breath is improved particularly with the increase in diuretics and more recently by the addition of diltiazem. Atrial fibrillation burden is about the same.  She's not had chest discomfort; she does not recall whether she had chest discomfort prior to her PCI or whether it manifested mostly as shortness of breath. She is complaining of significant fatigue in her legs as she tries to exercise.  She has treated sleep apnea  She's had her as she adjusted and she is feeling good deal better.      CHA2DS2/VAS Stroke Risk Points  6       1 Has Congestive Heart Failure:  Yes   1 Has Vascular Disease:  Yes   0 Has Hypertension:  No   2 Age:  69   1 Has Diabetes:  Yes   0 Had Stroke:  No Had TIA:  No Had thromboembolism:  No   1 Female:  Yes     No recent his warfarin in your blood count checked solution every 12 hours earlier not anemicCBC      Past Medical History:  Diagnosis Date  . Atrial fibrillation ([place])   . CAD (coronary artery disease)   . Cancer ([place])    skin  . CHF (congestive  heart failure) (HCC)     A-Fib  . COPD (chronic obstructive pulmonary disease) ([place])   . Diabetes mellitus without complication ([place])   . GERD (gastroesophageal reflux disease)   . Hyperlipidemia   . Hypocalcemia   . Hypokalemia   . Macular degeneration   . Myocardial infarction ([place])   . 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 Sinus node dysfunction (HCC)   . Thyroid disease   . Vitamin B12 deficiency   . Vitamin D deficiency       Surgical History:  Past Surgical History:  Procedure Laterality Date  . CHOLECYSTECTOMY    . COLONOSCOPY  2015   1 polyp removed- repeat 3 years  . CORONARY ANGIOPLASTY WITH STENT PLACEMENT    . parathyroid surgery     x 2  . THYROID SURGERY    . TOTAL SHOULDER ARTHROPLASTY Right    x 2  . VAGINAL HYSTERECTOMY       Home Meds: Prior to Admission medications   Medication Sig Start Date End Date Taking? Authorizing Provider  albuterol (PROVENTIL HFA;VENTOLIN HFA) 108 (90 Base) MCG/ACT inhaler Inhale 2 puffs into the lungs every 6 (six) hours as needed  for wheezing or shortness of breath.   Yes Historical Provider, MD  ARTIFICIAL TEAR OP Place 1 drop into both eyes as needed (for dry eyes).   Yes Historical Provider, MD  Cholecalciferol (VITAMIN D3) 5000 units TABS Take 1 tablet by mouth every morning.   Yes Historical Provider, MD  furosemide (LASIX) 20 MG tablet Take 60 mg by mouth every morning.   Yes Historical Provider, MD  isosorbide mononitrate (IMDUR) 30 MG 24 hr tablet Take 1 tablet (30 mg total) by mouth daily. 02/11/16  Yes Will [name], MD  levothyroxine (SYNTHROID, LEVOTHROID) 150 MCG tablet Take 1 tablet (150 mcg total) by mouth daily. 12/08/15  Yes Glean Hess, MD  Melatonin 5 MG TABS Take 1 tablet by mouth at bedtime.   Yes Historical Provider, MD  metFORMIN (GLUCOPHAGE) 500 MG tablet Take 1 tablet (500 mg total) by mouth 2 (two) times daily with a meal. 09/16/15  Yes Adline Potter, MD  metoprolol succinate  (TOPROL XL) 25 MG 24 hr tablet Take 1 tablet (25 mg total) by mouth daily. 02/11/16  Yes Will [name], MD  nitroGLYCERIN (NITROSTAT) 0.4 MG SL tablet Place 0.4 mg under the tongue every 5 (five) minutes as needed for chest pain.   Yes Historical Provider, MD  pantoprazole (PROTONIX) 40 MG tablet Take 1 tablet (40 mg total) by mouth daily. 10/16/15  Yes Juline Patch, MD  potassium chloride (KLOR-CON) 8 MEQ tablet Take 1 tablet (8 mEq total) by mouth 2 (two) times daily. 12/21/15  Yes Glean Hess, MD  rosuvastatin (CRESTOR) 40 MG tablet Take 1 tablet (40 mg total) by mouth daily. 09/18/15  Yes Minna Merritts, MD  sitaGLIPtin (JANUVIA) 50 MG tablet Take 50 mg by mouth daily.   Yes Historical Provider, MD  vitamin B-12 (CYANOCOBALAMIN) 500 MCG tablet Take 500 mcg by mouth daily.   Yes Historical Provider, MD  warfarin (COUMADIN) 7.5 MG tablet Take 1 tablet (7.5 mg total) by mouth daily. 01/20/16  Yes Minna Merritts, MD    Allergies:  Allergies  Allergen Reactions  . Demerol [Meperidine] Anaphylaxis    Tolerated Fentanyl 11/10/15  . Multaq [Dronedarone] Diarrhea  . Sulfa Antibiotics Anaphylaxis  . Tetracyclines & Related Other (See Comments) and Swelling    Made nose, lips,  And tongue itchy Made nose, lips,  And tongue itchy  . Metformin And Related Diarrhea    Social History   Socioeconomic History  . Marital status: Married    Spouse name: Not on file  . Number of children: Not on file  . Years of education: Not on file  . Highest education level: Not on file  Social Needs  . Financial resource strain: Not on file  . Food insecurity - worry: Not on file  . Food insecurity - inability: Not on file  . Transportation needs - medical: Not on file  . Transportation needs - non-medical: Not on file  Occupational History  . Occupation: Retired  Tobacco Use  . Smoking status: Never Smoker  . Smokeless tobacco: Never Used  Substance and Sexual Activity  . Alcohol use: Yes      Alcohol/week: 0.0 oz  . Drug use: No  . Sexual activity: Not on file  Other Topics Concern  . Not on file  Social History Narrative   Lives in [place] w/ husband.     Family History  Problem Relation Age of Onset  . Thyroid disease Mother 45  3 days after surgery  . Heart defect Father 61       ?ascending aortic aneurysm     ROS:  Please see the history of present illness.     All other systems reviewed and negative.   Well developed and nourished in no acute distress HENT normal Neck supple with JVP-6 Carotids brisk and full without bruits Clear Regular rate and rhythm, no murmurs or gallops Abd-soft with active BS without hepatomegaly No Clubbing cyanosis tr edema Skin-warm and dry A & Oriented  Grossly normal sensory and motor function     Labs: Cardiac Enzymes No results for input(s): CKTOTAL, CKMB, TROPONINI in the last 72 hours. CBC Lab Results  Component Value Date   WBC 7.2 10/18/2016   HGB 13.4 10/18/2016   HCT 39.6 10/18/2016   MCV 82 10/18/2016   PLT 213 10/18/2016   PROTIME: No results for input(s): LABPROT, INR in the last 72 hours. Chemistry No results for input(s): NA, K, CL, CO2, BUN, CREATININE, CALCIUM, PROT, BILITOT, ALKPHOS, ALT, AST, GLUCOSE in the last 168 hours.  Invalid input(s): LABALBU Lipids Lab Results  Component Value Date   CHOL 127 04/19/2016   HDL 42 04/19/2016   LDLCALC 43 04/19/2016   TRIG 209 (H) 04/19/2016   BNP No results found for: PROBNP Thyroid Function Tests: No results for input(s): TSH, T4TOTAL, T3FREE, THYROIDAB in the last 72 hours.  Invalid input(s): FREET3 Miscellaneous No results found for: DDIMER  Radiology/Studies:  No results found.  EKG:AV pacing   Assessment and Plan:  Sick Sinus syndrome  Persistent Atrial Fib with prior PVI  Pacemaker St Jude   OSA  P-wave oversensing resulting false detection of atrial fibrillation  HFpEF acute episodic  Coronary artery disease with  prior stenting  Renal insufficiency-worsening grade 4  Without symptoms of ischemia Although her dyspnea may be related to this.   Atrial fibrillation burden is unchanged on the diltiazem; the fact that she feels better suggested maybe some rate control benefit. She had been able to correlate atrial fibrillation associated with worsening dyspnea.  The fatigue that she is feeling with ambulation may be a manifestation of heart failure. We will check her echocardiogram. It may also be a manifestation of ischemia and if her ECG is unrevealing, will discuss with Dr. Rockey Situ as to whether repeat catheterization would be of value especially given the poor sensitivity and specificity of the Myoview scanning.  She has not had her CBC checked on anticoagulation. We will do that today.   She continues to have 6 episodes per hour on her sleep apnea. I've asked her to follow-up with her pulmonologist.  Renal function for whatever reason is vastly improved.      Virl Axe      ELECTROPHYSIOLOGY OFFICE  NOTE  Patient ID: Abisai Coble, MRN: [id number], DOB/AGE: [birth year] 76 y.o. Admit date: (Not on file) Date of Consult: 01/18/2017  Primary Physician: Glean Hess, MD Primary Cardiologist: tg       HPI Dawn Garcia is a 76 y.o. female  Seen in followup for pacing by Dr. 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 This was complicated by right atrial lead dislodgment requiring revision.  Because of recurrent atrial fibrillation he started her on dronaderone in the fall which was complicated by diarrhea. It was her impression however that prior to its discontinuation it had been associated with a marked improvement in exercise tolerance, allowing her to walk a flight of stairs without stopping for example.  She has  a history of a prior PCI.     DATE TEST    6/17    Echo   EF 65 %   11/17    Myoview   EF 54 % Anteroapical defect   8/18 Echo EF 50-55% Reviewed withTG   Shortness  of breath is improved particularly with the increase in diuretics and more recently by the addition of diltiazem. Atrial fibrillation burden is about the same.   She has treated sleep apnea  She's had her as she adjusted and she is feeling good deal better.      CHA2DS2/VAS Stroke Risk Points  6       1 Has Congestive Heart Failure:  Yes   1 Has Vascular Disease:  Yes   0 Has Hypertension:  No   2 Age:  22   1 Has Diabetes:  Yes   0 Had Stroke:  No Had TIA:  No Had thromboembolism:  No   1 Female:  Yes      Date Hgb  8/18 13.4       Some fatigue  Having atrial fibrillation episodes 1 or 2 days a week which leads were quite listless.  Associated with accumulation of fluid which responds to her diuretics.  No major bleeding issues   Past Medical History:  Diagnosis Date  . Atrial fibrillation ([place])   . CAD (coronary artery disease)   . Cancer ([place])    skin  . CHF (congestive heart failure) (HCC)     A-Fib  . COPD (chronic obstructive pulmonary disease) ([place])   . Diabetes mellitus without complication ([place])   . GERD (gastroesophageal reflux disease)   . Hyperlipidemia   . Hypocalcemia   . Hypokalemia   . Macular degeneration   . Myocardial infarction ([place])   . 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 Sinus node dysfunction (HCC)   . Thyroid disease   . Vitamin B12 deficiency   . Vitamin D deficiency       Surgical History:  Past Surgical History:  Procedure Laterality Date  . CHOLECYSTECTOMY    . COLONOSCOPY  2015   1 polyp removed- repeat 3 years  . CORONARY ANGIOPLASTY WITH STENT PLACEMENT    . parathyroid surgery     x 2  . THYROID SURGERY    . TOTAL SHOULDER ARTHROPLASTY Right    x 2  . VAGINAL HYSTERECTOMY       Home Meds: Prior to Admission medications   Medication Sig Start Date End Date Taking? Authorizing Provider  albuterol (PROVENTIL HFA;VENTOLIN HFA) 108 (90 Base) MCG/ACT inhaler Inhale 2 puffs into the lungs every 6 (six) hours  as needed for wheezing or shortness of breath.   Yes Historical Provider, MD  ARTIFICIAL TEAR OP Place 1 drop into both eyes as needed (for dry eyes).   Yes Historical Provider, MD  Cholecalciferol (VITAMIN D3) 5000 units TABS Take 1 tablet by mouth every morning.   Yes Historical Provider, MD  furosemide (LASIX) 20 MG tablet Take 60 mg by mouth every morning.   Yes Historical Provider, MD  isosorbide mononitrate (IMDUR) 30 MG 24 hr tablet Take 1 tablet (30 mg total) by mouth daily. 02/11/16  Yes Will [name], MD  levothyroxine (SYNTHROID, LEVOTHROID) 150 MCG tablet Take 1 tablet (150 mcg total) by mouth daily. 12/08/15  Yes Glean Hess, MD  Melatonin 5 MG TABS Take 1 tablet by mouth at bedtime.   Yes Historical Provider, MD  metFORMIN (GLUCOPHAGE)  500 MG tablet Take 1 tablet (500 mg total) by mouth 2 (two) times daily with a meal. 09/16/15  Yes Adline Potter, MD  metoprolol succinate (TOPROL XL) 25 MG 24 hr tablet Take 1 tablet (25 mg total) by mouth daily. 02/11/16  Yes Will [name], MD  nitroGLYCERIN (NITROSTAT) 0.4 MG SL tablet Place 0.4 mg under the tongue every 5 (five) minutes as needed for chest pain.   Yes Historical Provider, MD  pantoprazole (PROTONIX) 40 MG tablet Take 1 tablet (40 mg total) by mouth daily. 10/16/15  Yes Juline Patch, MD  potassium chloride (KLOR-CON) 8 MEQ tablet Take 1 tablet (8 mEq total) by mouth 2 (two) times daily. 12/21/15  Yes Glean Hess, MD  rosuvastatin (CRESTOR) 40 MG tablet Take 1 tablet (40 mg total) by mouth daily. 09/18/15  Yes Minna Merritts, MD  sitaGLIPtin (JANUVIA) 50 MG tablet Take 50 mg by mouth daily.   Yes Historical Provider, MD  vitamin B-12 (CYANOCOBALAMIN) 500 MCG tablet Take 500 mcg by mouth daily.   Yes Historical Provider, MD  warfarin (COUMADIN) 7.5 MG tablet Take 1 tablet (7.5 mg total) by mouth daily. 01/20/16  Yes Minna Merritts, MD    Allergies:  Allergies  Allergen Reactions  . Demerol [Meperidine] Anaphylaxis     Tolerated Fentanyl 11/10/15  . Multaq [Dronedarone] Diarrhea  . Sulfa Antibiotics Anaphylaxis  . Tetracyclines & Related Other (See Comments) and Swelling    Made nose, lips,  And tongue itchy Made nose, lips,  And tongue itchy  . Metformin And Related Diarrhea    Social History   Socioeconomic History  . Marital status: Married    Spouse name: Not on file  . Number of children: Not on file  . Years of education: Not on file  . Highest education level: Not on file  Social Needs  . Financial resource strain: Not on file  . Food insecurity - worry: Not on file  . Food insecurity - inability: Not on file  . Transportation needs - medical: Not on file  . Transportation needs - non-medical: Not on file  Occupational History  . Occupation: Retired  Tobacco Use  . Smoking status: Never Smoker  . Smokeless tobacco: Never Used  Substance and Sexual Activity  . Alcohol use: Yes    Alcohol/week: 0.0 oz  . Drug use: No  . Sexual activity: Not on file  Other Topics Concern  . Not on file  Social History Narrative   Lives in [place] w/ husband.     Family History  Problem Relation Age of Onset  . Thyroid disease Mother 71       3 days after surgery  . Heart defect Father 48       ?ascending aortic aneurysm     ROS:  Please see the history of present illness.     All other systems reviewed and negative.   Well developed and nourished in no acute distress HENT normal Neck supple with JVP-6 Carotids brisk and full without bruits Clear Regular rate and rhythm, no murmurs or gallops Abd-soft with active BS without hepatomegaly No Clubbing cyanosis tr edema Skin-warm and dry A & Oriented  Grossly normal sensory and motor function     Labs: Cardiac Enzymes No results for input(s): CKTOTAL, CKMB, TROPONINI in the last 72 hours. CBC Lab Results  Component Value Date   WBC 7.2 10/18/2016   HGB 13.4 10/18/2016   HCT 39.6 10/18/2016   MCV 82  10/18/2016   PLT 213  10/18/2016   PROTIME: No results for input(s): LABPROT, INR in the last 72 hours. Chemistry No results for input(s): NA, K, CL, CO2, BUN, CREATININE, CALCIUM, PROT, BILITOT, ALKPHOS, ALT, AST, GLUCOSE in the last 168 hours.  Invalid input(s): LABALBU Lipids Lab Results  Component Value Date   CHOL 127 04/19/2016   HDL 42 04/19/2016   LDLCALC 43 04/19/2016   TRIG 209 (H) 04/19/2016   BNP No results found for: PROBNP Thyroid Function Tests: No results for input(s): TSH, T4TOTAL, T3FREE, THYROIDAB in the last 72 hours.  Invalid input(s): FREET3 Miscellaneous No results found for: DDIMER  Radiology/Studies:  No results found.  EKG:AV pacing   Assessment and Plan:  Sick Sinus syndrome  Persistent Atrial Fib with prior PVI t2013 or so  Pacemaker St Jude   OSA  P-wave oversensing resulting false detection of atrial fibrillation  HFpEF acute episodic  Coronary artery disease with prior stenting  Interval atrial fibrillation.  Frequency is not so great however that she would like to do anything specific.  We have discussed in the past antiarrhythmic therapy specifically using dofetilide.  She is not a candidate for A1c given her coronary artery disease.  Without symptoms of ischemia   We discussed the use of the NOACs compared to Coumadin. We briefly reviewed the data of at least comparability in stroke prevention, bleeding and outcome. We discussed some of the new once wherein somewhat associated with decreased ischemic stroke risk, one to be taken daily, and has been shown to be comparable and bleeding risk to aspirin.  We also discussed bleeding associated with warfarin as well as NOACs and a wall bleeding as a complication of all these drugs intracranial bleeding is more frequently associated with warfarin then the NOACs and a GI bleeding is more commonly associated with the latter      Virl Axe

## 2017-01-20 ENCOUNTER — Telehealth: Payer: Self-pay | Admitting: Internal Medicine

## 2017-01-20 ENCOUNTER — Telehealth: Payer: Self-pay

## 2017-01-20 NOTE — Telephone Encounter (Signed)
Patient called stating she would like to get referral to ENT. States the ringing in her ears becoming louder and she also has spells of dizziness and thinks her ears are the cause.   Please Advise.

## 2017-01-20 NOTE — Telephone Encounter (Addendum)
Per 11/7 OV notes: " We discussed the use of the NOACs compared to Coumadin". Pt called back to report she would like to change to Eliquis. September labs: serum creatinine 1.15 As I do not see specific MD instructions, will route to Dr. Caryl Comes

## 2017-01-20 NOTE — Telephone Encounter (Signed)
Pt states she is ready to start on Eliquis. Please call.

## 2017-01-21 ENCOUNTER — Other Ambulatory Visit: Payer: Self-pay | Admitting: Internal Medicine

## 2017-01-21 DIAGNOSIS — H9190 Unspecified hearing loss, unspecified ear: Secondary | ICD-10-CM

## 2017-01-23 NOTE — Telephone Encounter (Signed)
Spoke w/ pt.  Advised her to stop coumadin and come in for INR check on Wednesday.  If INR < 2.0, will start Eliquis 5 mg at that time.  She is appreciative and will be here at 3:45 on 11/14.

## 2017-01-23 NOTE — Telephone Encounter (Signed)
Dose would be 5 mg bid ( for 2.5 need 2 of 3  --age>80  Wt < 60 Cr > 1.5 ) thx

## 2017-01-24 ENCOUNTER — Other Ambulatory Visit: Payer: Self-pay | Admitting: Cardiology

## 2017-01-24 ENCOUNTER — Encounter: Payer: Medicare Other | Admitting: Internal Medicine

## 2017-01-25 ENCOUNTER — Ambulatory Visit (INDEPENDENT_AMBULATORY_CARE_PROVIDER_SITE_OTHER): Payer: Medicare Other

## 2017-01-25 DIAGNOSIS — Z7189 Other specified counseling: Secondary | ICD-10-CM

## 2017-01-25 DIAGNOSIS — I4891 Unspecified atrial fibrillation: Secondary | ICD-10-CM

## 2017-01-25 DIAGNOSIS — I480 Paroxysmal atrial fibrillation: Secondary | ICD-10-CM | POA: Diagnosis not present

## 2017-01-25 DIAGNOSIS — Z7901 Long term (current) use of anticoagulants: Secondary | ICD-10-CM

## 2017-01-25 LAB — POCT INR: INR: 1.5

## 2017-01-25 MED ORDER — APIXABAN 5 MG PO TABS: 5.0000 mg | ORAL_TABLET | Freq: Two times a day (BID) | ORAL | 3 refills | Status: DC

## 2017-01-25 NOTE — Patient Instructions (Signed)
Pt was started on Eliquis for afib on January 25, 2017.    Reviewed patients medication list.  Pt is not currently on any combined P-gp and strong CYP3A4 inhibitors/inducers (ketoconazole, traconazole, ritonavir, carbamazepine, phenytoin, rifampin, St. John's wort).  Reviewed labs.  SCr 1.15, Weight 212 lbs, CrCl- 64.17.  Dose appropriate based on age, weight, and SCr.  Hgb and HCT Within Normal Limits  A full discussion of the nature of anticoagulants has been carried out.  A benefit/risk analysis has been presented to the patient, so that they understand the justification for choosing anticoagulation with Eliquis at this time.  The need for compliance is stressed.  Pt is aware to take the medication twice daily.  Side effects of potential bleeding are discussed, including unusual colored urine or stools, coughing up blood or coffee ground emesis, nose bleeds or serious fall or head trauma.  Discussed signs and symptoms of stroke. The patient should avoid any OTC items containing aspirin or ibuprofen.  Avoid alcohol consumption.   Call if any signs of abnormal bleeding.  Discussed financial obligations and resolved any difficulty in obtaining medication.  Next lab test in 1 month.

## 2017-01-26 ENCOUNTER — Ambulatory Visit: Payer: Medicare Other | Admitting: Gastroenterology

## 2017-01-26 DIAGNOSIS — H353112 Nonexudative age-related macular degeneration, right eye, intermediate dry stage: Secondary | ICD-10-CM | POA: Diagnosis not present

## 2017-01-26 DIAGNOSIS — H35033 Hypertensive retinopathy, bilateral: Secondary | ICD-10-CM | POA: Diagnosis not present

## 2017-01-26 DIAGNOSIS — H35433 Paving stone degeneration of retina, bilateral: Secondary | ICD-10-CM | POA: Diagnosis not present

## 2017-01-26 DIAGNOSIS — H353221 Exudative age-related macular degeneration, left eye, with active choroidal neovascularization: Secondary | ICD-10-CM | POA: Diagnosis not present

## 2017-01-31 ENCOUNTER — Ambulatory Visit: Payer: Medicare Other | Admitting: Gastroenterology

## 2017-01-31 ENCOUNTER — Telehealth: Payer: Self-pay | Admitting: Internal Medicine

## 2017-01-31 NOTE — Telephone Encounter (Signed)
Spoke with patient at length and she would like to switch over to Dr. Saunders Revel from Dr. Rockey Situ. Reviewed with her our process for this which is to obtain approval from both physicians prior to making the switch. She verbalized understanding and was very appreciative for the time. Let her know that I would give her a call once that has been done. She had no further questions at this time.

## 2017-01-31 NOTE — Telephone Encounter (Signed)
Pt would like to discuss a recommendation for a cardiologist outside of our practice.

## 2017-02-01 NOTE — Telephone Encounter (Signed)
I am agreeable.  Nelva Bush, MD [facility] Pager: [phone]

## 2017-02-05 NOTE — Telephone Encounter (Signed)
That is fine, thx.

## 2017-02-06 NOTE — Telephone Encounter (Signed)
Spoke with patient and let her know that both were agreeable with the switch and that I would have someone give her a call to schedule appointment. She was appreciative for the call and had no further questions at this time.

## 2017-02-07 ENCOUNTER — Telehealth: Payer: Self-pay

## 2017-02-07 ENCOUNTER — Other Ambulatory Visit: Payer: Self-pay | Admitting: Internal Medicine

## 2017-02-07 DIAGNOSIS — F5101 Primary insomnia: Secondary | ICD-10-CM

## 2017-02-07 MED ORDER — TRAZODONE HCL 50 MG PO TABS: 50.0000 mg | ORAL_TABLET | Freq: Every day | ORAL | 1 refills | Status: DC

## 2017-02-07 NOTE — Telephone Encounter (Signed)
Patient informed med sent and how to take medicine.

## 2017-02-07 NOTE — Telephone Encounter (Signed)
Pt schedule with Dr End in Jan    Nothing else needed

## 2017-02-07 NOTE — Telephone Encounter (Signed)
Trazodone sent to [facility].

## 2017-02-07 NOTE — Telephone Encounter (Signed)
Patient called leaving Vm stating she was told at last appt if she continues to have trouble with sleeping to call the office and Dr Army Melia would call something in for it? Please Advise.

## 2017-02-09 ENCOUNTER — Telehealth: Payer: Self-pay | Admitting: Internal Medicine

## 2017-02-09 NOTE — Telephone Encounter (Signed)
S/w patient. She's been prescribed Trazodone at bedtime to help her sleep. She states it says patient's who have abnormal heart beat. Patient has atrial fib. Advised I will route to Dr Caryl Comes for review.

## 2017-02-09 NOTE — Telephone Encounter (Signed)
Pt states her PCP ordered for her to sleep, Trazodone. She asks if she should take this with her arrhythmia. Please call and advise.

## 2017-02-10 ENCOUNTER — Other Ambulatory Visit: Payer: Self-pay | Admitting: Otolaryngology

## 2017-02-10 DIAGNOSIS — H9311 Tinnitus, right ear: Secondary | ICD-10-CM | POA: Diagnosis not present

## 2017-02-10 DIAGNOSIS — R42 Dizziness and giddiness: Secondary | ICD-10-CM

## 2017-02-10 DIAGNOSIS — H903 Sensorineural hearing loss, bilateral: Secondary | ICD-10-CM | POA: Diagnosis not present

## 2017-02-10 DIAGNOSIS — H93011 Transient ischemic deafness, right ear: Secondary | ICD-10-CM

## 2017-02-10 NOTE — Telephone Encounter (Signed)
No answer. Left detail message with recommendation that ok to take trazodone, ok per DPR, and to call back if any questions.

## 2017-02-10 NOTE — Telephone Encounter (Signed)
yes

## 2017-02-13 ENCOUNTER — Other Ambulatory Visit (HOSPITAL_COMMUNITY): Payer: Self-pay | Admitting: Otolaryngology

## 2017-02-13 DIAGNOSIS — R42 Dizziness and giddiness: Secondary | ICD-10-CM

## 2017-02-14 LAB — CUP PACEART INCLINIC DEVICE CHECK
Date Time Interrogation Session: 20181107202336
Implantable Lead Implant Date: 20170829
Implantable Lead Location: 753859
Implantable Lead Location: 753860
Lead Channel Pacing Threshold Pulse Width: 0.4 ms
Lead Channel Sensing Intrinsic Amplitude: 12 mV
Lead Channel Setting Pacing Amplitude: 2.25 V
Lead Channel Setting Pacing Pulse Width: 0.4 ms
MDC IDC LEAD IMPLANT DT: 20170829
MDC IDC MSMT BATTERY REMAINING LONGEVITY: 105 mo
MDC IDC MSMT BATTERY VOLTAGE: 3.01 V
MDC IDC MSMT LEADCHNL RA IMPEDANCE VALUE: 462.5 Ohm
MDC IDC MSMT LEADCHNL RA PACING THRESHOLD AMPLITUDE: 1.25 V
MDC IDC MSMT LEADCHNL RA PACING THRESHOLD PULSEWIDTH: 0.4 ms
MDC IDC MSMT LEADCHNL RA SENSING INTR AMPL: 4.4 mV
MDC IDC MSMT LEADCHNL RV IMPEDANCE VALUE: 625 Ohm
MDC IDC MSMT LEADCHNL RV PACING THRESHOLD AMPLITUDE: 1 V
MDC IDC PG IMPLANT DT: 20170829
MDC IDC PG SERIAL: 7945290
MDC IDC SET LEADCHNL RV PACING AMPLITUDE: 2.5 V
MDC IDC SET LEADCHNL RV SENSING SENSITIVITY: 2 mV
MDC IDC STAT BRADY RA PERCENT PACED: 56 %
MDC IDC STAT BRADY RV PERCENT PACED: 94 %
Pulse Gen Model: 2272

## 2017-02-21 ENCOUNTER — Ambulatory Visit (HOSPITAL_COMMUNITY)
Admission: RE | Admit: 2017-02-21 | Discharge: 2017-02-21 | Disposition: A | Discharge status: Home / Self Care | Payer: Medicare Other | Source: Ambulatory Visit | Attending: Otolaryngology | Admitting: Otolaryngology

## 2017-02-21 DIAGNOSIS — R42 Dizziness and giddiness: Secondary | ICD-10-CM | POA: Diagnosis not present

## 2017-02-21 LAB — CREATININE, SERUM
Creatinine, Ser: 1.2 mg/dL — ABNORMAL HIGH (ref 0.44–1.00)
GFR calc Af Amer: 50 mL/min — ABNORMAL LOW (ref >60)
GFR calc non Af Amer: 43 mL/min — ABNORMAL LOW (ref >60)

## 2017-02-21 MED ORDER — GADOBENATE DIMEGLUMINE 529 MG/ML IV SOLN
20.0000 mL | Freq: Once | INTRAVENOUS | Status: AC | PRN
  Administered 2017-02-21: 20 mL via INTRAVENOUS

## 2017-02-23 ENCOUNTER — Other Ambulatory Visit (INDEPENDENT_AMBULATORY_CARE_PROVIDER_SITE_OTHER): Payer: Medicare Other

## 2017-02-23 DIAGNOSIS — N183 Chronic kidney disease, stage 3 unspecified: Secondary | ICD-10-CM

## 2017-02-24 ENCOUNTER — Telehealth: Payer: Self-pay | Admitting: Internal Medicine

## 2017-02-24 LAB — CBC WITH DIFFERENTIAL/PLATELET
Basophils Absolute: 0 10*3/uL (ref 0.0–0.2)
Basos: 0 %
EOS (ABSOLUTE): 0.3 10*3/uL (ref 0.0–0.4)
EOS: 5 %
HEMATOCRIT: 42.7 % (ref 34.0–46.6)
HEMOGLOBIN: 13.7 g/dL (ref 11.1–15.9)
IMMATURE GRANS (ABS): 0 10*3/uL (ref 0.0–0.1)
IMMATURE GRANULOCYTES: 0 %
LYMPHS: 30 %
Lymphocytes Absolute: 2 10*3/uL (ref 0.7–3.1)
MCH: 27.6 pg (ref 26.6–33.0)
MCHC: 32.1 g/dL (ref 31.5–35.7)
MCV: 86 fL (ref 79–97)
MONOCYTES: 7 %
Monocytes Absolute: 0.5 10*3/uL (ref 0.1–0.9)
NEUTROS PCT: 58 %
Neutrophils Absolute: 4 10*3/uL (ref 1.4–7.0)
Platelets: 192 10*3/uL (ref 150–379)
RBC: 4.97 x10E6/uL (ref 3.77–5.28)
RDW: 16.2 % — AB (ref 12.3–15.4)
WBC: 6.8 10*3/uL (ref 3.4–10.8)

## 2017-02-24 LAB — BASIC METABOLIC PANEL
BUN/Creatinine Ratio: 16 (ref 12–28)
BUN: 19 mg/dL (ref 8–27)
CALCIUM: 9 mg/dL (ref 8.7–10.3)
CO2: 22 mmol/L (ref 20–29)
CREATININE: 1.19 mg/dL — AB (ref 0.57–1.00)
Chloride: 104 mmol/L (ref 96–106)
GFR calc Af Amer: 52 mL/min/{1.73_m2} — ABNORMAL LOW (ref >59)
GFR, EST NON AFRICAN AMERICAN: 45 mL/min/{1.73_m2} — AB (ref >59)
Glucose: 249 mg/dL — ABNORMAL HIGH (ref 65–99)
POTASSIUM: 3.9 mmol/L (ref 3.5–5.2)
Sodium: 141 mmol/L (ref 134–144)

## 2017-02-24 NOTE — Telephone Encounter (Signed)
Patient returning lab results call 

## 2017-02-24 NOTE — Telephone Encounter (Signed)
Spoke with the patient about her results. 

## 2017-03-09 DIAGNOSIS — H35033 Hypertensive retinopathy, bilateral: Secondary | ICD-10-CM | POA: Diagnosis not present

## 2017-03-09 DIAGNOSIS — H353221 Exudative age-related macular degeneration, left eye, with active choroidal neovascularization: Secondary | ICD-10-CM | POA: Diagnosis not present

## 2017-03-09 DIAGNOSIS — H35433 Paving stone degeneration of retina, bilateral: Secondary | ICD-10-CM | POA: Diagnosis not present

## 2017-03-09 DIAGNOSIS — H353112 Nonexudative age-related macular degeneration, right eye, intermediate dry stage: Secondary | ICD-10-CM | POA: Diagnosis not present

## 2017-03-20 ENCOUNTER — Telehealth: Payer: Self-pay | Admitting: Internal Medicine

## 2017-03-20 NOTE — Telephone Encounter (Signed)
Pt felt bad over weekend felt like she was out of rhythm until last night about 11:00 the patient will send in transmission for review and would like to proceed with ablation will forward message to Dr Caryl Comes for review .Adonis Housekeeper

## 2017-03-20 NOTE — Telephone Encounter (Signed)
New message  Patient states over the weekend she felt really bad. Feeling some better today. Patient want to discuss ablation. Please call    \Patient c/o Palpitations:  High priority if patient c/o lightheadedness, shortness of breath, or chest pain  1) How long have you had palpitations/irregular HR/ Afib? Are you having the symptoms now? 2 days  Are you currently experiencing lightheadedness, SOB or CP? SOB, lightheaded  2) Do you have a history of afib (atrial fibrillation) or irregular heart rhythm? YES  3) Have you checked your BP or HR? (document readings if available): N/A  4) Are you experiencing any other symptoms? Palpitations over the weekend, weakness and lightheaded

## 2017-03-21 ENCOUNTER — Ambulatory Visit (INDEPENDENT_AMBULATORY_CARE_PROVIDER_SITE_OTHER): Payer: Medicare Other | Admitting: *Deleted

## 2017-03-21 ENCOUNTER — Telehealth: Payer: Self-pay | Admitting: Cardiology

## 2017-03-21 ENCOUNTER — Telehealth: Payer: Self-pay | Admitting: Internal Medicine

## 2017-03-21 VITALS — BP 138/72 | HR 63 | Ht 63.0 in | Wt 207.0 lb

## 2017-03-21 DIAGNOSIS — R001 Bradycardia, unspecified: Secondary | ICD-10-CM | POA: Diagnosis not present

## 2017-03-21 DIAGNOSIS — I495 Sick sinus syndrome: Secondary | ICD-10-CM | POA: Diagnosis not present

## 2017-03-21 DIAGNOSIS — I480 Paroxysmal atrial fibrillation: Secondary | ICD-10-CM | POA: Diagnosis not present

## 2017-03-21 LAB — CUP PACEART INCLINIC DEVICE CHECK
Implantable Lead Implant Date: 20170829
Implantable Lead Location: 753860
Lead Channel Pacing Threshold Amplitude: 1 V
Lead Channel Sensing Intrinsic Amplitude: 12 mV
MDC IDC LEAD IMPLANT DT: 20170829
MDC IDC LEAD LOCATION: 753859
MDC IDC MSMT LEADCHNL RA PACING THRESHOLD AMPLITUDE: 1 V
MDC IDC MSMT LEADCHNL RA PACING THRESHOLD PULSEWIDTH: 0.4 ms
MDC IDC MSMT LEADCHNL RA SENSING INTR AMPL: 4.8 mV
MDC IDC PG IMPLANT DT: 20170829
MDC IDC SESS DTM: 20190108171859
Pulse Gen Model: 2272
Pulse Gen Serial Number: 7945290

## 2017-03-21 MED ORDER — AMIODARONE HCL 200 MG PO TABS: ORAL_TABLET | ORAL | 0 refills | Status: DC

## 2017-03-21 NOTE — Progress Notes (Signed)
Remote pacemaker transmission.   

## 2017-03-21 NOTE — Telephone Encounter (Signed)
To Dr. Klein to review. 

## 2017-03-21 NOTE — Telephone Encounter (Signed)
New Message  Pt call requesting to speak with Device to see if there was in activity on her device this morning. Pt would like to know if Dr. Caryl Comes had any suggestion for pt.

## 2017-03-21 NOTE — Progress Notes (Signed)
Pacemaker check in clinic. Normal device function. Thresholds, sensing, impedances consistent with previous measurements. Device programmed to maximize longevity. 25 mode switches (18%)+Eliquis-- EGMs appear AF and FFRW. Patient c/o of SOB, CP, abdominal bloating, lightheadedness, and weakness on 1/5-1/6-- On 1/5 patient in AF for 26 hours, Peak rate 640/136 bpm; On 1/6 patient in AF for 10 hours, Peak rate 640/111 bpm. Reviewed with SK. No high ventricular rates noted. Device programmed at appropriate safety margins. Histogram distribution appropriate for patient activity level. Device programmed to optimize intrinsic conduction. Estimated longevity 8.5-9.3 years. Patient education completed. ROV with SK in 1 month.

## 2017-03-21 NOTE — Telephone Encounter (Signed)
Will forward to [facility] to review- did a transmission come in on this patient yesterday?

## 2017-03-21 NOTE — Telephone Encounter (Signed)
Transmission was received yesterday. Unfortunately RF telemetry has been suspended on her device for 30 days (it's a technical issue, nothing wrong with PPM) and we don't have diagnostics. There is an alert for Dawn Garcia AT/AF episode and Exceeded AT/AF Burden- I can assume she has had AF, but I cannot determine when. Only way to know is an in-office check or a remote transmission next month.

## 2017-03-21 NOTE — Telephone Encounter (Signed)
I spoke with the patient and made her aware of Dr. Olin Pia recommendations. She is agreeable to coming this afternoon at 2:30 pm for an EKG and device interrogation.

## 2017-03-21 NOTE — Telephone Encounter (Signed)
See 03/20/17 phone note- open for the same issue- will close this encounter.

## 2017-03-21 NOTE — Progress Notes (Signed)
1.) Reason for visit: EKG  2.) Name of MD requesting visit:  A-fib  3.) H&P: Patient with a history of atrial fibrillation who called the office yesterday with complaints of a-fib over the weekend. Per the patient, Saturday (1/5) & Sunday (1/6) she had symptoms of severe shortness of breath, mid sternal chest pressure, weakness, abdominal bloating, and lightheadedness that lasted all day on Saturday and up until 11 pm on Sunday. Per her pulse oximeter, her HR readings were in the low 100's, but would vary with activity. Reviewed with Dr. Caryl Comes and he advised the patient come to the office for an EKG and device interrogation. Per device interrogation, the patient was in a-fib all day Saturday and 10 hours on Sunday with her highest HR's around 136 bpm.  4.) ROS related to problem: The patient states she is feeling better than she did over the weekend, but she is still feeling fatigued. She is having intermittent chest discomfort. Her shortness of breath is better although she is still having this with some slight abdominal bloating.   5.) Assessment and plan per MD: Reviewed device interrogation and EKG with Dr. Caryl Comes. Orders received to start amiodarone 400 mg BID x 2 weeks, then decrease to 400 mg daily x 2 weeks, then decrease to 200 mg once daily. The patient is agreeable. She will return for follow up in 1 month with Dr. Caryl Comes. She has been advised to call back with any worsening symptoms or concerns.

## 2017-03-21 NOTE — Telephone Encounter (Signed)
He has had a problem with atrial over sensing.  Reviewing her log her last visit, there were many short episodes of mode switch aware of the atrial   Rate was about twice the ventricular rate these likely represent over sensing.  Other episodes the atrial rates were in the 400s, ventricular rates were in the 30s, these likely represent atrial fibrillation.  Suggested by [facility] I, probably need to have her come in.

## 2017-03-21 NOTE — Telephone Encounter (Deleted)
error 

## 2017-03-21 NOTE — Patient Instructions (Addendum)
Medication Instructions: Your physician has recommended you make the following change in your medication:  1) Start amiodarone 200 mg: - take 2 tablets (400 mg) by mouth TWICE daily x 2 weeks,  - then take 2 tablets (400 mg) by mouth ONCE daily x 2 weeks,  - then take 1 tablet (200 mg) by mouth ONCE daily   Labwork: - none ordered  Procedures/Testing: - none ordered  Follow-Up: - Your physician recommends that you schedule a follow-up appointment in: 1 month with Dr. Caryl Comes.    Any Additional Special Instructions Will Be Listed Below (If Applicable).     If you need a refill on your cardiac medications before your next appointment, please call your pharmacy.      Amiodarone tablets What is this medicine? AMIODARONE (a MEE oh da rone) is an antiarrhythmic drug. It helps make your heart beat regularly. Because of the side effects caused by this medicine, it is only used when other medicines have not worked. It is usually used for heartbeat problems that may be life threatening. This medicine may be used for other purposes; ask your health care provider or pharmacist if you have questions. COMMON BRAND NAME(S): Cordarone, Pacerone What should I tell my health care provider before I take this medicine? They need to know if you have any of these conditions: -liver disease -lung disease -other heart problems -thyroid disease -an unusual or allergic reaction to amiodarone, iodine, other medicines, foods, dyes, or preservatives -pregnant or trying to get pregnant -breast-feeding How should I use this medicine? Take this medicine by mouth with a glass of water. Follow the directions on the prescription label. You can take this medicine with or without food. However, you should always take it the same way each time. Take your doses at regular intervals. Do not take your medicine more often than directed. Do not stop taking except on the advice of your doctor or health care  professional. A special MedGuide will be given to you by the pharmacist with each prescription and refill. Be sure to read this information carefully each time. Talk to your pediatrician regarding the use of this medicine in children. Special care may be needed. Overdosage: If you think you have taken too much of this medicine contact a poison control center or emergency room at once. NOTE: This medicine is only for you. Do not share this medicine with others. What if I miss a dose? If you miss a dose, take it as soon as you can. If it is almost time for your next dose, take only that dose. Do not take double or extra doses. What may interact with this medicine? Do not take this medicine with any of the following medications: -abarelix -apomorphine -arsenic trioxide -certain antibiotics like erythromycin, gemifloxacin, levofloxacin, pentamidine -certain medicines for depression like amoxapine, tricyclic antidepressants -certain medicines for fungal infections like fluconazole, itraconazole, ketoconazole, posaconazole, voriconazole -certain medicines for irregular heart beat like disopyramide, dofetilide, dronedarone, ibutilide, propafenone, sotalol -certain medicines for malaria like chloroquine, halofantrine -cisapride -droperidol -haloperidol -hawthorn -maprotiline -methadone -phenothiazines like chlorpromazine, mesoridazine, thioridazine -pimozide -ranolazine -red yeast rice -vardenafil -ziprasidone This medicine may also interact with the following medications: -antiviral medicines for HIV or AIDS -certain medicines for blood pressure, heart disease, irregular heart beat -certain medicines for cholesterol like atorvastatin, cerivastatin, lovastatin, simvastatin -certain medicines for hepatitis C like sofosbuvir and ledipasvir; sofosbuvir -certain medicines for seizures like phenytoin -certain medicines for thyroid problems -certain medicines that treat or prevent blood clots  like  warfarin -cholestyramine -cimetidine -clopidogrel -cyclosporine -dextromethorphan -diuretics -fentanyl -general anesthetics -grapefruit juice -lidocaine -loratadine -methotrexate -other medicines that prolong the QT interval (cause an abnormal heart rhythm) -procainamide -quinidine -rifabutin, rifampin, or rifapentine -St. John's Wort -trazodone This list may not describe all possible interactions. Give your health care provider a list of all the medicines, herbs, non-prescription drugs, or dietary supplements you use. Also tell them if you smoke, drink alcohol, or use illegal drugs. Some items may interact with your medicine. What should I watch for while using this medicine? Your condition will be monitored closely when you first begin therapy. Often, this drug is first started in a hospital or other monitored health care setting. Once you are on maintenance therapy, visit your doctor or health care professional for regular checks on your progress. Because your condition and use of this medicine carry some risk, it is a good idea to carry an identification card, necklace or bracelet with details of your condition, medications, and doctor or health care professional. Dennis Bast may get drowsy or dizzy. Do not drive, use machinery, or do anything that needs mental alertness until you know how this medicine affects you. Do not stand or sit up quickly, especially if you are an older patient. This reduces the risk of dizzy or fainting spells. This medicine can make you more sensitive to the sun. Keep out of the sun. If you cannot avoid being in the sun, wear protective clothing and use sunscreen. Do not use sun lamps or tanning beds/booths. You should have regular eye exams before and during treatment. Call your doctor if you have blurred vision, see halos, or your eyes become sensitive to light. Your eyes may get dry. It may be helpful to use a lubricating eye solution or artificial tears  solution. If you are going to have surgery or a procedure that requires contrast dyes, tell your doctor or health care professional that you are taking this medicine. What side effects may I notice from receiving this medicine? Side effects that you should report to your doctor or health care professional as soon as possible: -allergic reactions like skin rash, itching or hives, swelling of the face, lips, or tongue -blue-gray coloring of the skin -blurred vision, seeing blue green halos, increased sensitivity of the eyes to light -breathing problems -chest pain -dark urine -fast, irregular heartbeat -feeling faint or light-headed -intolerance to heat or cold -nausea or vomiting -pain and swelling of the scrotum -pain, tingling, numbness in feet, hands -redness, blistering, peeling or loosening of the skin, including inside the mouth -spitting up blood -stomach pain -sweating -unusual or uncontrolled movements of body -unusually weak or tired -weight gain or loss -yellowing of the eyes or skin Side effects that usually do not require medical attention (report to your doctor or health care professional if they continue or are bothersome): -change in sex drive or performance -constipation -dizziness -headache -loss of appetite -trouble sleeping This list may not describe all possible side effects. Call your doctor for medical advice about side effects. You may report side effects to FDA at 1-800-FDA-1088. Where should I keep my medicine? Keep out of the reach of children. Store at room temperature between 20 and 25 degrees C (68 and 77 degrees F). Protect from light. Keep container tightly closed. Throw away any unused medicine after the expiration date. NOTE: This sheet is a summary. It may not cover all possible information. If you have questions about this medicine, talk to your doctor, pharmacist, or health care  provider.  2018 Elsevier/Gold Standard (2013-06-03 19:48:11)

## 2017-03-22 ENCOUNTER — Encounter: Payer: Self-pay | Admitting: Cardiology

## 2017-03-23 NOTE — Telephone Encounter (Signed)
Opened in error

## 2017-03-25 ENCOUNTER — Emergency Department: Payer: Medicare Other

## 2017-03-25 ENCOUNTER — Encounter: Payer: Self-pay | Admitting: Emergency Medicine

## 2017-03-25 ENCOUNTER — Other Ambulatory Visit: Payer: Self-pay

## 2017-03-25 ENCOUNTER — Emergency Department
Admission: EM | Admit: 2017-03-25 | Discharge: 2017-03-25 | Disposition: A | Discharge status: Home / Self Care | Payer: Medicare Other | Attending: Emergency Medicine | Admitting: Emergency Medicine

## 2017-03-25 DIAGNOSIS — Z96611 Presence of right artificial shoulder joint: Secondary | ICD-10-CM | POA: Insufficient documentation

## 2017-03-25 DIAGNOSIS — Z7984 Long term (current) use of oral hypoglycemic drugs: Secondary | ICD-10-CM | POA: Diagnosis not present

## 2017-03-25 DIAGNOSIS — Z85828 Personal history of other malignant neoplasm of skin: Secondary | ICD-10-CM | POA: Diagnosis not present

## 2017-03-25 DIAGNOSIS — I4891 Unspecified atrial fibrillation: Secondary | ICD-10-CM | POA: Diagnosis not present

## 2017-03-25 DIAGNOSIS — E039 Hypothyroidism, unspecified: Secondary | ICD-10-CM | POA: Insufficient documentation

## 2017-03-25 DIAGNOSIS — Z955 Presence of coronary angioplasty implant and graft: Secondary | ICD-10-CM | POA: Diagnosis not present

## 2017-03-25 DIAGNOSIS — J449 Chronic obstructive pulmonary disease, unspecified: Secondary | ICD-10-CM | POA: Insufficient documentation

## 2017-03-25 DIAGNOSIS — N183 Chronic kidney disease, stage 3 (moderate): Secondary | ICD-10-CM | POA: Diagnosis not present

## 2017-03-25 DIAGNOSIS — Z7901 Long term (current) use of anticoagulants: Secondary | ICD-10-CM | POA: Diagnosis not present

## 2017-03-25 DIAGNOSIS — Z79899 Other long term (current) drug therapy: Secondary | ICD-10-CM | POA: Insufficient documentation

## 2017-03-25 DIAGNOSIS — I129 Hypertensive chronic kidney disease with stage 1 through stage 4 chronic kidney disease, or unspecified chronic kidney disease: Secondary | ICD-10-CM | POA: Insufficient documentation

## 2017-03-25 DIAGNOSIS — I2510 Atherosclerotic heart disease of native coronary artery without angina pectoris: Secondary | ICD-10-CM | POA: Insufficient documentation

## 2017-03-25 DIAGNOSIS — E876 Hypokalemia: Secondary | ICD-10-CM | POA: Insufficient documentation

## 2017-03-25 DIAGNOSIS — R0602 Shortness of breath: Secondary | ICD-10-CM

## 2017-03-25 DIAGNOSIS — E1122 Type 2 diabetes mellitus with diabetic chronic kidney disease: Secondary | ICD-10-CM | POA: Diagnosis not present

## 2017-03-25 LAB — TROPONIN I: Troponin I: <0.03 ng/mL (ref <0.03)

## 2017-03-25 LAB — CBC
HEMATOCRIT: 48.8 % — AB (ref 35.0–47.0)
Hemoglobin: 16.4 g/dL — ABNORMAL HIGH (ref 12.0–16.0)
MCH: 28.4 pg (ref 26.0–34.0)
MCHC: 33.5 g/dL (ref 32.0–36.0)
MCV: 84.5 fL (ref 80.0–100.0)
PLATELETS: 210 10*3/uL (ref 150–440)
RBC: 5.78 MIL/uL — ABNORMAL HIGH (ref 3.80–5.20)
RDW: 16.3 % — AB (ref 11.5–14.5)
WBC: 8.9 10*3/uL (ref 3.6–11.0)

## 2017-03-25 LAB — BASIC METABOLIC PANEL
Anion gap: 13 (ref 5–15)
BUN: 24 mg/dL — AB (ref 6–20)
CHLORIDE: 101 mmol/L (ref 101–111)
CO2: 23 mmol/L (ref 22–32)
Calcium: 9.4 mg/dL (ref 8.9–10.3)
Creatinine, Ser: 1.6 mg/dL — ABNORMAL HIGH (ref 0.44–1.00)
GFR, EST AFRICAN AMERICAN: 35 mL/min — AB (ref >60)
GFR, EST NON AFRICAN AMERICAN: 30 mL/min — AB (ref >60)
Glucose, Bld: 148 mg/dL — ABNORMAL HIGH (ref 65–99)
POTASSIUM: 3.1 mmol/L — AB (ref 3.5–5.1)
SODIUM: 137 mmol/L (ref 135–145)

## 2017-03-25 LAB — MAGNESIUM: Magnesium: 2.2 mg/dL (ref 1.7–2.4)

## 2017-03-25 MED ORDER — SODIUM CHLORIDE 0.9 % IV BOLUS (SEPSIS)
500.0000 mL | Freq: Once | INTRAVENOUS | Status: AC
  Administered 2017-03-25: 500 mL via INTRAVENOUS

## 2017-03-25 MED ORDER — POTASSIUM CHLORIDE CRYS ER 20 MEQ PO TBCR
40.0000 meq | EXTENDED_RELEASE_TABLET | Freq: Once | ORAL | Status: AC
  Administered 2017-03-25: 40 meq via ORAL
  Filled 2017-03-25: qty 2

## 2017-03-25 NOTE — ED Provider Notes (Signed)
[facility] Emergency Department Provider Note   ____________________________________________   I have reviewed the triage vital signs and the nursing notes.   HISTORY  Chief Complaint Shortness of Breath   History limited by: Not Limited   HPI Dawn Garcia is a 77 y.o. female who presents to the emergency department today because of concern for shortness of breath and atrial fibrillation.  DURATION:roughly 7 hours TIMING: started suddenly, constant SEVERITY: shortness of breath QUALITY: SOB CONTEXT: patient has history of sick sinus and afib. States that last weekend she had a bad episode where her heart rate was very elevated. Went to cardiology office where they interrogated her pacemaker and prescribed amiodarone. Had felt better throughout the week until this morning. MODIFYING FACTORS: worse with exertion ASSOCIATED SYMPTOMS: denies any chest pain. No fevers.  Per medical record review patient has a history of CHF, atrial fibrillation. Had pacemaker interrogated on 1/8 by cardiology office.   Past Medical History:  Diagnosis Date  . Atrial fibrillation ([place])   . CAD (coronary artery disease)   . Cancer ([place])    skin  . CHF (congestive heart failure) (HCC)     A-Fib  . COPD (chronic obstructive pulmonary disease) ([place])   . Diabetes mellitus without complication ([place])   . GERD (gastroesophageal reflux disease)   . Hyperlipidemia   . Hypocalcemia   . Hypokalemia   . Macular degeneration   . Myocardial infarction ([place])   . 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 Sinus node dysfunction (HCC)   . Thyroid disease   . Vitamin B12 deficiency   . Vitamin D deficiency     Patient Active Problem List   Diagnosis Date Noted  . Lipoma of neck 09/05/2016  . 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 Bunion of great toe 09/05/2016  . Hyperlipidemia 09/18/2015  . Symptomatic bradycardia 09/18/2015   . Coronary artery disease 09/10/2015  . B12 deficiency 09/10/2015  . History of melanoma 09/09/2015  . COPD (chronic obstructive pulmonary disease) ([place]) 09/09/2015  . Hypothyroidism following radioiodine therapy 09/09/2015  . Obesity, Class II, BMI 35-39.9 08/20/2015  . GERD with stricture 08/20/2015  . Macular degeneration 08/20/2015  . Diastolic CHF ([place]) 86[birth year]76[birth year]1950  . History of parathyroidectomy 08/20/2015  . CKD (chronic kidney disease) stage 3, GFR 30-59 ml/min (HCC) 08/20/2015  . Controlled type 2 diabetes mellitus with diabetic nephropathy, without long-term current use of insulin ([place]) 08/19/2015  . Primary gout 08/19/2015    Past Surgical History:  Procedure Laterality Date  . CHOLECYSTECTOMY    . COLONOSCOPY  2015   1 polyp removed- repeat 3 years  . CORONARY ANGIOPLASTY WITH STENT PLACEMENT    . EP IMPLANTABLE DEVICE N/A 11/10/2015   Procedure: Pacemaker Implant;  Surgeon: Will [name], MD;  Location: [facility];  Service: Cardiovascular;  Laterality: N/A;  . EP IMPLANTABLE DEVICE N/A 11/11/2015   Procedure: Lead Revision/Repair;  Surgeon: Evans Lance, MD;  Location: [facility];  Service: Cardiovascular;  Laterality: N/A;  . parathyroid surgery     x 2  . THYROID SURGERY    . TOTAL SHOULDER ARTHROPLASTY Right    x 2  . VAGINAL HYSTERECTOMY      Prior to Admission medications   Medication Sig Start Date End Date Taking? Authorizing Provider  albuterol (PROVENTIL HFA;VENTOLIN HFA) 108 (90 Base) MCG/ACT inhaler Inhale 2 puffs into the lungs every 6 (six) hours as needed for wheezing  or shortness of breath.    [provider]  amiodarone (PACERONE) 200 MG tablet Take 2 tablets (400 mg) by mouth TWICE daily x 2 weeks, then decrease to 2 tablets (400 mg) by mouth ONCE daily 03/21/17   Deboraha Sprang, MD  apixaban (ELIQUIS) 5 MG TABS tablet Take 1 tablet (5 mg total) 2 (two) times daily by mouth. 01/25/17   Deboraha Sprang, MD  ARTIFICIAL  TEAR OP Place 1 drop into both eyes as needed (for dry eyes).    [provider]  Biotin 1000 MCG CHEW Chew 1,000 mcg by mouth daily.    [provider]  Cholecalciferol (VITAMIN D3) 5000 units TABS Take 1 tablet by mouth every morning.    [provider]  diltiazem (CARDIZEM CD) 120 MG 24 hr capsule Take 1 capsule (120 mg total) by mouth daily. 10/03/16   Minna Merritts, MD  empagliflozin (JARDIANCE) 10 MG TABS tablet Take 10 mg by mouth daily. 01/09/17   Glean Hess, MD  furosemide (LASIX) 20 MG tablet Take 5 tablets (100 mg) by mouth once daily    [provider]  levothyroxine (SYNTHROID, LEVOTHROID) 150 MCG tablet Take 1 tablet (150 mcg total) by mouth daily. 09/05/16   Glean Hess, MD  Melatonin 5 MG TABS Take 1 tablet by mouth at bedtime.    [provider]  metFORMIN (GLUCOPHAGE) 500 MG tablet Take by mouth daily.    [provider]  nitroGLYCERIN (NITROSTAT) 0.4 MG SL tablet Place 0.4 mg under the tongue every 5 (five) minutes as needed for chest pain.    [provider]  pantoprazole (PROTONIX) 40 MG tablet Take 1 tablet (40 mg total) by mouth daily. 09/05/16   Glean Hess, MD  potassium chloride (KLOR-CON) 8 MEQ tablet TAKE ONE TABLET BY MOUTH TWICE DAILY 09/18/16   Glean Hess, MD  rosuvastatin (CRESTOR) 40 MG tablet Take 1 tablet (40 mg total) by mouth daily. 06/15/16   Minna Merritts, MD  vitamin B-12 (CYANOCOBALAMIN) 500 MCG tablet Take 500 mcg by mouth daily.    [provider]    Allergies Demerol [meperidine]; Multaq [dronedarone]; Sulfa antibiotics; Tetracyclines & related; and Metformin and related  Family History  Problem Relation Age of Onset  . Thyroid disease Mother 87       3 days after surgery  . Heart defect Father 57       ?ascending aortic aneurysm    Social History Social History   Tobacco Use  . Smoking status: Never Smoker  . Smokeless tobacco: Never Used   Substance Use Topics  . Alcohol use: Yes    Alcohol/week: 0.0 oz  . Drug use: No    Review of Systems Constitutional: No fever/chills Eyes: No visual changes. ENT: No sore throat. Cardiovascular: Denies chest pain. Respiratory: Positive for shortness of breath. Gastrointestinal: Positive for abdominal bloating.  Genitourinary: Negative for dysuria. Musculoskeletal: Negative for back pain. Skin: Negative for rash. Neurological: Negative for headaches, focal weakness or numbness.  ____________________________________________   PHYSICAL EXAM:  VITAL SIGNS: ED Triage Vitals  Enc Vitals Group     BP 03/25/17 1503 128/78     Pulse Rate 03/25/17 1503 69     Resp 03/25/17 1503 (!) 22     Temp 03/25/17 1503 97.8 F (36.6 C)     Temp Source 03/25/17 1503 Oral     SpO2 03/25/17 1503 97 %     Weight 03/25/17 1451 207  lb (93.9 kg)     Height --      Head Circumference --      Peak Flow --      Pain Score 03/25/17 1545 0   Constitutional: Alert and oriented. Well appearing and in no distress. Eyes: Conjunctivae are normal.  ENT   Head: Normocephalic and atraumatic.   Nose: No congestion/rhinnorhea.   Mouth/Throat: Mucous membranes are moist.   Neck: No stridor. Hematological/Lymphatic/Immunilogical: No cervical lymphadenopathy. Cardiovascular: Normal rate, regular rhythm.  No murmurs, rubs, or gallops.  Respiratory: Normal respiratory effort without tachypnea nor retractions. Breath sounds are clear and equal bilaterally. No wheezes/rales/rhonchi. Gastrointestinal: Soft and non tender. No rebound. No guarding.  Genitourinary: Deferred Musculoskeletal: Normal range of motion in all extremities. No lower extremity edema. Neurologic:  Normal speech and language. No gross focal neurologic deficits are appreciated.  Skin:  Skin is warm, dry and intact. No rash noted. Psychiatric: Mood and affect are normal. Speech and behavior are normal. Patient exhibits appropriate  insight and judgment.  ____________________________________________    LABS (pertinent positives/negatives)  Trop <0.03 BMP k 3.1, cr 1.60 CBC wbc 8.9, hgb 16.4, plt 210  ____________________________________________   EKG  I, Nance Pear, attending physician, personally viewed and interpreted this EKG  EKG Time: 1500 Rate: 84 Rhythm: atrial fibrillation Axis: normal Intervals: qtc 543 QRS: narrow ST changes: no st elevation,  t wave inversion II, III, aVF, V3-V6 Impression: abnormal ekg  ____________________________________________    RADIOLOGY  CXR No acute disease  ____________________________________________   PROCEDURES  Procedures  ____________________________________________   INITIAL IMPRESSION / ASSESSMENT AND PLAN / ED COURSE  Pertinent labs & imaging results that were available during my care of the patient were reviewed by me and considered in my medical decision making (see chart for details).  Patient presented to the emergency department today because of concern for shortness of breath and arrhythmias.  Patient does have a history of a pacemaker.  Differential would be broad including pacemaker issues, pneumonia, pneumothorax, PE, cardiac etiology amongst other differentials.  We did attempt to interrogate the pacemaker here but never got a report.  However the patient did have interrogated a couple days ago without any concerning ventricular arrhythmias.  Patient was found to be somewhat hypokalemic and was given potassium here in the emergency department.  She did state that she felt better afterwards.  No concerning findings on chest x-ray.  Troponin negative x2.  Discussed findings of workup with patient.  Will discharge to follow-up with primary care.    ____________________________________________   FINAL CLINICAL IMPRESSION(S) / ED DIAGNOSES  Final diagnoses:  Shortness of breath  Hypokalemia  Atrial fibrillation, unspecified type  [facility])     Note: This dictation was prepared with Dragon dictation. Any transcriptional errors that result from this process are unintentional     Nance Pear, MD 03/25/17 1932

## 2017-03-25 NOTE — ED Triage Notes (Signed)
Pt with reports of shortness of breath increasing since Wednesday. Pt with hx of irregular heart beat, just start taking amiodarone. Was told to come to ER if sx persist. Pt with pacemaker.

## 2017-03-25 NOTE — ED Notes (Signed)
This RN interrogated pt pacemaker- st [facility]. Awaiting fax results.

## 2017-03-25 NOTE — Discharge Instructions (Signed)
Please seek medical attention for any high fevers, chest pain, shortness of breath, change in behavior, persistent vomiting, bloody stool or any other new or concerning symptoms.  

## 2017-03-25 NOTE — ED Notes (Signed)
Pt ambulatory to toilet by self. States she felt SOB getting up.

## 2017-03-25 NOTE — ED Notes (Signed)
98% RA after pt walked back from toilet.

## 2017-03-25 NOTE — ED Notes (Signed)
Pt in xray

## 2017-03-25 NOTE — ED Notes (Signed)
Pt states hx a fib x 10 years. Hasn't been on medication until few days ago started on amiodarone. Has a pacemaker x 1.5 years. States has a bedside monitor at home, HR in 70's, states has been going going up to 120's. States her a fib is making her feel SOB. Pt is speaking in complete sentences. Oxygen on room air 99%. No distress noted.

## 2017-03-29 ENCOUNTER — Ambulatory Visit: Payer: Medicare Other | Admitting: Internal Medicine

## 2017-04-03 DIAGNOSIS — H40003 Preglaucoma, unspecified, bilateral: Secondary | ICD-10-CM | POA: Diagnosis not present

## 2017-04-04 ENCOUNTER — Ambulatory Visit: Payer: Medicare Other | Admitting: Internal Medicine

## 2017-04-04 ENCOUNTER — Encounter: Payer: Self-pay | Admitting: Internal Medicine

## 2017-04-04 LAB — HM DIABETES EYE EXAM

## 2017-04-14 DIAGNOSIS — J984 Other disorders of lung: Secondary | ICD-10-CM

## 2017-04-14 HISTORY — DX: Other disorders of lung: J98.4

## 2017-04-14 HISTORY — PX: CARDIAC CATHETERIZATION: SHX172

## 2017-04-18 ENCOUNTER — Ambulatory Visit (INDEPENDENT_AMBULATORY_CARE_PROVIDER_SITE_OTHER): Payer: Medicare Other | Admitting: Internal Medicine

## 2017-04-18 ENCOUNTER — Encounter: Payer: Self-pay | Admitting: Internal Medicine

## 2017-04-18 VITALS — BP 110/70 | HR 60 | Ht 63.0 in | Wt 202.5 lb

## 2017-04-18 DIAGNOSIS — I495 Sick sinus syndrome: Secondary | ICD-10-CM | POA: Diagnosis not present

## 2017-04-18 DIAGNOSIS — Z79899 Other long term (current) drug therapy: Secondary | ICD-10-CM

## 2017-04-18 DIAGNOSIS — I481 Persistent atrial fibrillation: Secondary | ICD-10-CM

## 2017-04-18 DIAGNOSIS — I4819 Other persistent atrial fibrillation: Secondary | ICD-10-CM

## 2017-04-18 DIAGNOSIS — Z950 Presence of cardiac pacemaker: Secondary | ICD-10-CM | POA: Diagnosis not present

## 2017-04-18 MED ORDER — AMIODARONE HCL 200 MG PO TABS: ORAL_TABLET | ORAL | 3 refills | Status: DC

## 2017-04-18 MED ORDER — TORSEMIDE 20 MG PO TABS: 40.0000 mg | ORAL_TABLET | Freq: Every day | ORAL | 3 refills | Status: DC

## 2017-04-18 NOTE — Progress Notes (Signed)
ELECTROPHYSIOLOGY OFFICE  NOTE  Patient ID: Dawn Garcia, MRN: [id number], DOB/AGE: January 16, 1941 77 y.o. Admit date: (Not on file) Date of Consult: 04/18/2017  Primary Physician: Glean Hess, MD Primary Cardiologist: tg       HPI Dawn Garcia is a 77 y.o. female  Seen in followup for pacing by Dr. 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 This was complicated by right atrial lead dislodgment requiring revision.  Because of recurrent atrial fibrillation he started her on dronaderone in the fall which was complicated by diarrhea. It was her impression however that prior to its discontinuation it had been associated with a marked improvement in exercise tolerance, allowing her to walk a flight of stairs without stopping for example.  She has a history of a prior PCI.     DATE TEST    6/17    Echo   EF 65 %   11/17    Myoview   EF 54 % Anteroapical defect  8/18 Echo  EF 50-55%  possible wall motion abnormality-reviewed by TG no further workup necessary   She has had recurrent atrial fibrillation which prompted an evaluation in the emergency room 1/19 she has had significant symptoms with her atrial fibrillation of listlessness and fatigue.  She wonders whether some of her symptoms may not derive from the recent initiation of amiodarone.  She has significant abdominal swelling.  But not peripheral edema.  She has treated sleep apnea with recent reports of good compliance  When seen in the emergency room the last potassium level was 3.1   Date Cr K TSH LFTs Hgb  12/17   0.034    12/18 1.19 3.9      1/19 1.69 3.1   16.4   No evidence of bleeding CHA2DS2/VAS Stroke Risk Points  6       1 Has Congestive Heart Failure:  Yes   1 Has Vascular Disease:  Yes   0 Has Hypertension:  No   2 Age:  45   1 Has Diabetes:  Yes   0 Had Stroke:  No Had TIA:  No Had thromboembolism:  No   1 Female:  Yes     No recent his warfarin in your blood count checked solution every 12 hours  earlier not anemicCBC      Past Medical History:  Diagnosis Date  . Atrial fibrillation ([place])   . CAD (coronary artery disease)   . Cancer ([place])    skin  . CHF (congestive heart failure) (HCC)     A-Fib  . COPD (chronic obstructive pulmonary disease) ([place])   . Diabetes mellitus without complication ([place])   . GERD (gastroesophageal reflux disease)   . Hyperlipidemia   . Hypocalcemia   . Hypokalemia   . Macular degeneration   . Myocardial infarction ([place])   . 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 Sinus node dysfunction (HCC)   . Thyroid disease   . Vitamin B12 deficiency   . Vitamin D deficiency       Surgical History:  Past Surgical History:  Procedure Laterality Date  . CHOLECYSTECTOMY    . COLONOSCOPY  2015   1 polyp removed- repeat 3 years  . CORONARY ANGIOPLASTY WITH STENT PLACEMENT    . EP IMPLANTABLE DEVICE N/A 11/10/2015   Procedure: Pacemaker Implant;  Surgeon: Will [name], MD;  Location: [facility];  Service: Cardiovascular;  Laterality: N/A;  . EP IMPLANTABLE DEVICE N/A 11/11/2015   Procedure: Lead  Revision/Repair;  Surgeon: Evans Lance, MD;  Location: [facility];  Service: Cardiovascular;  Laterality: N/A;  . parathyroid surgery     x 2  . THYROID SURGERY    . TOTAL SHOULDER ARTHROPLASTY Right    x 2  . VAGINAL HYSTERECTOMY       Home Meds: Prior to Admission medications   Medication Sig Start Date End Date Taking? Authorizing Provider  albuterol (PROVENTIL HFA;VENTOLIN HFA) 108 (90 Base) MCG/ACT inhaler Inhale 2 puffs into the lungs every 6 (six) hours as needed for wheezing or shortness of breath.   Yes Historical Provider, MD  ARTIFICIAL TEAR OP Place 1 drop into both eyes as needed (for dry eyes).   Yes Historical Provider, MD  Cholecalciferol (VITAMIN D3) 5000 units TABS Take 1 tablet by mouth every morning.   Yes Historical Provider, MD  furosemide (LASIX) 20 MG tablet Take 60 mg by mouth every morning.   Yes  Historical Provider, MD  isosorbide mononitrate (IMDUR) 30 MG 24 hr tablet Take 1 tablet (30 mg total) by mouth daily. 02/11/16  Yes Will [name], MD  levothyroxine (SYNTHROID, LEVOTHROID) 150 MCG tablet Take 1 tablet (150 mcg total) by mouth daily. 12/08/15  Yes Glean Hess, MD  Melatonin 5 MG TABS Take 1 tablet by mouth at bedtime.   Yes Historical Provider, MD  metFORMIN (GLUCOPHAGE) 500 MG tablet Take 1 tablet (500 mg total) by mouth 2 (two) times daily with a meal. 09/16/15  Yes Adline Potter, MD  metoprolol succinate (TOPROL XL) 25 MG 24 hr tablet Take 1 tablet (25 mg total) by mouth daily. 02/11/16  Yes Will [name], MD  nitroGLYCERIN (NITROSTAT) 0.4 MG SL tablet Place 0.4 mg under the tongue every 5 (five) minutes as needed for chest pain.   Yes Historical Provider, MD  pantoprazole (PROTONIX) 40 MG tablet Take 1 tablet (40 mg total) by mouth daily. 10/16/15  Yes Juline Patch, MD  potassium chloride (KLOR-CON) 8 MEQ tablet Take 1 tablet (8 mEq total) by mouth 2 (two) times daily. 12/21/15  Yes Glean Hess, MD  rosuvastatin (CRESTOR) 40 MG tablet Take 1 tablet (40 mg total) by mouth daily. 09/18/15  Yes Minna Merritts, MD  sitaGLIPtin (JANUVIA) 50 MG tablet Take 50 mg by mouth daily.   Yes Historical Provider, MD  vitamin B-12 (CYANOCOBALAMIN) 500 MCG tablet Take 500 mcg by mouth daily.   Yes Historical Provider, MD  warfarin (COUMADIN) 7.5 MG tablet Take 1 tablet (7.5 mg total) by mouth daily. 01/20/16  Yes Minna Merritts, MD    Allergies:  Allergies  Allergen Reactions  . Demerol [Meperidine] Anaphylaxis    Tolerated Fentanyl 11/10/15  . Multaq [Dronedarone] Diarrhea  . Sulfa Antibiotics Anaphylaxis  . Tetracyclines & Related Other (See Comments) and Swelling    Made nose, lips,  And tongue itchy Made nose, lips,  And tongue itchy  . Metformin And Related Diarrhea    Social History   Socioeconomic History  . Marital status: Married    Spouse name: Not on  file  . Number of children: Not on file  . Years of education: Not on file  . Highest education level: Not on file  Social Needs  . Financial resource strain: Not on file  . Food insecurity - worry: Not on file  . Food insecurity - inability: Not on file  . Transportation needs - medical: Not on file  . Transportation needs - non-medical: Not on  file  Occupational History  . Occupation: Retired  Tobacco Use  . Smoking status: Never Smoker  . Smokeless tobacco: Never Used  Substance and Sexual Activity  . Alcohol use: Yes    Alcohol/week: 0.0 oz  . Drug use: No  . Sexual activity: Not on file  Other Topics Concern  . Not on file  Social History Narrative   Lives in [place] w/ husband.     Family History  Problem Relation Age of Onset  . Thyroid disease Mother 71       3 days after surgery  . Heart defect Father 87       ?ascending aortic aneurysm     ROS:  Please see the history of present illness.     All other systems reviewed and negative.  Well developed and nourished in no acute distress HENT normal Neck supple with JVP-6-7 Carotids brisk and full without bruits Clear Regular rate and rhythm, no murmurs or gallops Abd-soft with active BS without hepatomegaly No Clubbing cyanosis edema Skin-warm and dry A & Oriented  Grossly normal sensory and motor function   Labs: Cardiac Enzymes No results for input(s): CKTOTAL, CKMB, TROPONINI in the last 72 hours. CBC Lab Results  Component Value Date   WBC 8.9 03/25/2017   HGB 16.4 (H) 03/25/2017   HCT 48.8 (H) 03/25/2017   MCV 84.5 03/25/2017   PLT 210 03/25/2017   PROTIME: No results for input(s): LABPROT, INR in the last 72 hours. Chemistry No results for input(s): NA, K, CL, CO2, BUN, CREATININE, CALCIUM, PROT, BILITOT, ALKPHOS, ALT, AST, GLUCOSE in the last 168 hours.  Invalid input(s): LABALBU Lipids Lab Results  Component Value Date   CHOL 127 04/19/2016   HDL 42 04/19/2016   LDLCALC 43  04/19/2016   TRIG 209 (H) 04/19/2016   BNP No results found for: PROBNP Thyroid Function Tests: No results for input(s): TSH, T4TOTAL, T3FREE, THYROIDAB in the last 72 hours.  Invalid input(s): FREET3 Miscellaneous No results found for: DDIMER  Radiology/Studies:  Dg Chest 2 View  Result Date: 03/25/2017 CLINICAL DATA:  Short of breath and weakness with exertion EXAM: CHEST  2 VIEW COMPARISON:  11/12/2015 FINDINGS: Left subclavian pacemaker device and leads are stable. Normal heart size. Linear scarring at the left lung base. No pneumothorax. No pleural effusion. Right total shoulder arthroplasty unchanged. IMPRESSION: No active cardiopulmonary disease. Electronically Signed   By: Marybelle Killings M.D.   On: 03/25/2017 15:58    EKG: AV pacing   Assessment and Plan:  Sick Sinus syndrome  Persistent Atrial Fib with prior PVI  Pacemaker [employer]   OSA  P-wave oversensing resulting false detection of atrial fibrillation  HFpEF acute chronic   Coronary artery disease with prior stenting  Renal insufficiency-improved\\  She continues to struggle with symptomatic atrial fibrillation despite amiodarone.  Rate control is no longer an issue so we will discontinue diltiazem.  No bleeding issues.  We will arrange for her to consult with Dr. Carlyn Reichert for consideration of repeat ablation  Volume overloaded manifested primarily by abdominal distention and edema.  We will discontinue her furosemide try her on torsemide as it has better oral absorption.  Encouraged her to continue efforts at weight loss     Virl Axe

## 2017-04-18 NOTE — Patient Instructions (Addendum)
Medication Instructions:  Your physician has recommended you make the following change in your medication:   1. Stop Diltizem (cardizem)  2. Stop Lasix (furosimide)   3. Begin Torsemide (demadex) 20mg ; take 2 tablets (40mg ) every day   Labwork: Your physician recommends that you have labs drawn today: CMET, Mg, and TSH  Testing/Procedures: Dr Caryl Comes would like you to follow up with Dr Curt Bears for a repeat Afib ablation consult.   Follow-Up: Your physician recommends that you schedule a follow-up appointment in: 3 months with Dr Caryl Comes.   Remote monitoring is used to monitor your Pacemaker from home. This monitoring reduces the number of office visits required to check your device to one time per year. It allows [place] to keep an eye on the functioning of your device to ensure it is working properly. You are scheduled for a device check from home on 06/20/17. You may send your transmission at any time that day. If you have a wireless device, the transmission will be sent automatically. After your physician reviews your transmission, you will receive a postcard with your next transmission date.   Any Other Special Instructions Will Be Listed Below (If Applicable).     If you need a refill on your cardiac medications before your next appointment, please call your pharmacy.

## 2017-04-19 LAB — COMPREHENSIVE METABOLIC PANEL
ALBUMIN: 4.5 g/dL (ref 3.5–4.8)
ALT: 27 IU/L (ref 0–32)
AST: 27 IU/L (ref 0–40)
Albumin/Globulin Ratio: 1.7 (ref 1.2–2.2)
Alkaline Phosphatase: 260 IU/L — ABNORMAL HIGH (ref 39–117)
BUN / CREAT RATIO: 13 (ref 12–28)
BUN: 21 mg/dL (ref 8–27)
Bilirubin Total: 0.5 mg/dL (ref 0.0–1.2)
CALCIUM: 9.4 mg/dL (ref 8.7–10.3)
CO2: 16 mmol/L — AB (ref 20–29)
CREATININE: 1.6 mg/dL — AB (ref 0.57–1.00)
Chloride: 98 mmol/L (ref 96–106)
GFR calc non Af Amer: 31 mL/min/{1.73_m2} — ABNORMAL LOW (ref >59)
GFR, EST AFRICAN AMERICAN: 36 mL/min/{1.73_m2} — AB (ref >59)
GLUCOSE: 337 mg/dL — AB (ref 65–99)
Globulin, Total: 2.6 g/dL (ref 1.5–4.5)
Potassium: 3.9 mmol/L (ref 3.5–5.2)
Sodium: 140 mmol/L (ref 134–144)
TOTAL PROTEIN: 7.1 g/dL (ref 6.0–8.5)

## 2017-04-19 LAB — MAGNESIUM: Magnesium: 2.4 mg/dL — ABNORMAL HIGH (ref 1.6–2.3)

## 2017-04-19 LAB — TSH: TSH: 0.174 u[IU]/mL — ABNORMAL LOW (ref 0.450–4.500)

## 2017-04-20 ENCOUNTER — Encounter: Payer: Medicare Other | Admitting: Internal Medicine

## 2017-04-24 ENCOUNTER — Telehealth: Payer: Self-pay | Admitting: Internal Medicine

## 2017-04-24 DIAGNOSIS — H353221 Exudative age-related macular degeneration, left eye, with active choroidal neovascularization: Secondary | ICD-10-CM | POA: Diagnosis not present

## 2017-04-24 DIAGNOSIS — H353113 Nonexudative age-related macular degeneration, right eye, advanced atrophic without subfoveal involvement: Secondary | ICD-10-CM | POA: Diagnosis not present

## 2017-04-24 DIAGNOSIS — H35433 Paving stone degeneration of retina, bilateral: Secondary | ICD-10-CM | POA: Diagnosis not present

## 2017-04-24 DIAGNOSIS — I5032 Chronic diastolic (congestive) heart failure: Secondary | ICD-10-CM

## 2017-04-24 DIAGNOSIS — H43813 Vitreous degeneration, bilateral: Secondary | ICD-10-CM | POA: Diagnosis not present

## 2017-04-24 NOTE — Telephone Encounter (Signed)
I called and spoke with the patient. She saw Dr. Caryl Comes on 04/18/17 and he stopped her furosemide and switched her to torsemide 40 mg once daily. Per the patient, she did not urinate any more on the 40 mg dose, so she has taken 60 mg once daily x 3 days. She reports losing 1 lb over the last week.   I advised her I will review with Dr. Caryl Comes and call her back in the morning at latest.  She is agreeable.

## 2017-04-24 NOTE — Telephone Encounter (Signed)
Pt states she has tried the Torsemide 40mg  and 60mg  and neither one has cause her l"ose my fluid" . Should she go to 80 mg. Please call to discuss.

## 2017-04-25 ENCOUNTER — Other Ambulatory Visit
Admission: RE | Admit: 2017-04-25 | Discharge: 2017-04-25 | Disposition: A | Discharge status: Home / Self Care | Payer: Medicare Other | Source: Ambulatory Visit | Attending: Internal Medicine | Admitting: Internal Medicine

## 2017-04-25 DIAGNOSIS — I5032 Chronic diastolic (congestive) heart failure: Secondary | ICD-10-CM | POA: Diagnosis not present

## 2017-04-25 LAB — BRAIN NATRIURETIC PEPTIDE: B Natriuretic Peptide: 57 pg/mL (ref 0.0–100.0)

## 2017-04-25 NOTE — Telephone Encounter (Signed)
Reviewed my conversation from yesterday with the patient with Dr. Caryl Comes.  Orders received from Dr. Caryl Comes to: 1) take torsemide 80 mg today 2) obtain a BNP today 3) if BNP elevated, then he may prescribe metalozone 2.5 mg to be taken, but this is to be determined.  I left a message for the patient to call.

## 2017-04-25 NOTE — Telephone Encounter (Signed)
I spoke with the patient and she is aware of Dr. Olin Pia recommendations to: 1) take torsemide 80 mg today 2) have labwork for a BNP- she will go to the [facility] to have this drawn today  She is aware Dr. Caryl Comes may prescribe metalozone if needed based on BNP results.   She is agreeable and voices understanding.

## 2017-04-26 ENCOUNTER — Encounter: Payer: Self-pay | Admitting: Internal Medicine

## 2017-04-26 ENCOUNTER — Telehealth: Payer: Self-pay | Admitting: Internal Medicine

## 2017-04-26 ENCOUNTER — Ambulatory Visit (INDEPENDENT_AMBULATORY_CARE_PROVIDER_SITE_OTHER): Payer: Medicare Other | Admitting: Internal Medicine

## 2017-04-26 VITALS — BP 120/80 | HR 84 | Ht 63.0 in | Wt 202.0 lb

## 2017-04-26 DIAGNOSIS — I480 Paroxysmal atrial fibrillation: Secondary | ICD-10-CM | POA: Diagnosis not present

## 2017-04-26 DIAGNOSIS — N183 Chronic kidney disease, stage 3 unspecified: Secondary | ICD-10-CM

## 2017-04-26 DIAGNOSIS — E1121 Type 2 diabetes mellitus with diabetic nephropathy: Secondary | ICD-10-CM

## 2017-04-26 DIAGNOSIS — E890 Postprocedural hypothyroidism: Secondary | ICD-10-CM | POA: Diagnosis not present

## 2017-04-26 DIAGNOSIS — I5032 Chronic diastolic (congestive) heart failure: Secondary | ICD-10-CM

## 2017-04-26 NOTE — Telephone Encounter (Signed)
See 04/26/17 phone note.

## 2017-04-26 NOTE — Telephone Encounter (Signed)
Pt c/o Shortness Of Breath: STAT if SOB developed within the last 24 hours or pt is noticeably SOB on the phone  1. Are you currently SOB (can you hear that pt is SOB on the phone)? no  2. How long have you been experiencing SOB? This has been happening for a while but is getting worse dr. Caryl Comes aware per patient   3. Are you SOB when sitting or when up moving around? Sob with exertion   4. Are you currently experiencing any other symptoms?  Increased Weakness bloating but not as bad    Patient wants to know if this is due to amiodarone and thinks she has converted to afib

## 2017-04-26 NOTE — Telephone Encounter (Signed)
Reviewed symptoms with Dr. Caryl Comes- he would like to see the patient in the office tomorrow.  I have called the patient and made her aware- she will come in tomorrow morning at 11:15 am to see Dr. Caryl Comes.

## 2017-04-26 NOTE — Progress Notes (Signed)
Date:  04/26/2017   Name:  Dawn Garcia   DOB:  1940-07-30   MRN:  [id number]   Chief Complaint: Follow-up (F/up from labs from cardiology. )  Thyroid Problem  Presents for follow-up visit. Symptoms include fatigue and palpitations (more afib). Patient reports no constipation, depressed mood, diarrhea or leg swelling.   Metabolic Acidosis - CO2 noted to be low.  She was advised to have labs rechecked.  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  Since she is now higher dose metformin, we may need to adjust medication and potentially refer to nephrology.  Afib - becoming more frequent so now seeing a specialist for possible ablation next week. She also had amiodarone added.  She has had a bad week.  DM - she has stopped glimepiride and increased metformin.  She has also been experimenting with intermittent fasting.  She did it three days a week for 2 weeks and her BS did improve.  We discussed Dr. Sammuel Hines seminar and the info sheet was provided.  We may refer there if she is interested.  CHF - presented with abdominal distention and CHF.  Lasix ws stopped and torsemide started. Since then she has been having a steady diuresis.  Lab Results  Component Value Date   TSH 0.174 (L) 04/18/2017   Lab Results  Component Value Date   CREATININE 1.60 (H) 04/18/2017   BUN 21 04/18/2017   NA 140 04/18/2017   K 3.9 04/18/2017   CL 98 04/18/2017   CO2 16 (L) 04/18/2017   Anion gap = 26  Review of Systems  Constitutional: Positive for fatigue. Negative for chills and fever.  Eyes: Positive for visual disturbance.  Respiratory: Positive for shortness of breath. Negative for chest tightness and wheezing.   Cardiovascular: Positive for palpitations (more afib). Negative for chest pain and leg swelling.  Gastrointestinal: Positive for abdominal distention. Negative for constipation, diarrhea and vomiting.  Skin: Negative for color change and wound.  Neurological: Negative for dizziness.     Patient Active Problem List   Diagnosis Date Noted  . Lipoma of neck 09/05/2016  . 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 Bunion of great toe 09/05/2016  . Hyperlipidemia 09/18/2015  . Symptomatic bradycardia 09/18/2015  . Coronary artery disease 09/10/2015  . B12 deficiency 09/10/2015  . History of melanoma 09/09/2015  . COPD (chronic obstructive pulmonary disease) ([place]) 09/09/2015  . Hypothyroidism following radioiodine therapy 09/09/2015  . Obesity, Class II, BMI 35-39.9 08/20/2015  . GERD with stricture 08/20/2015  . Macular degeneration 08/20/2015  . Diastolic CHF ([place]) 65/05/5463  . History of parathyroidectomy 08/20/2015  . CKD (chronic kidney disease) stage 3, GFR 30-59 ml/min (HCC) 08/20/2015  . Controlled type 2 diabetes mellitus with diabetic nephropathy, without long-term current use of insulin ([place]) 08/19/2015  . Primary gout 08/19/2015    Prior to Admission medications   Medication Sig Start Date End Date Taking? Authorizing Provider  albuterol (PROVENTIL HFA;VENTOLIN HFA) 108 (90 Base) MCG/ACT inhaler Inhale 2 puffs into the lungs every 6 (six) hours as needed for wheezing or shortness of breath.   Yes [provider]  amiodarone (PACERONE) 200 MG tablet Take 200 mg by mouth daily.   Yes [provider]  apixaban (ELIQUIS) 5 MG TABS tablet Take 1 tablet (5 mg total) 2 (two) times daily by mouth. 01/25/17  Yes Deboraha Sprang, MD  Biotin 1000 MCG CHEW Chew 1,000 mcg by mouth daily.   Yes  [provider]  Cholecalciferol (VITAMIN D3) 5000 units TABS Take 1 tablet by mouth every morning.   Yes [provider]  empagliflozin (JARDIANCE) 10 MG TABS tablet Take 10 mg by mouth daily. 01/09/17  Yes Glean Hess, MD  levothyroxine (SYNTHROID, LEVOTHROID) 150 MCG tablet Take 1 tablet (150 mcg total) by mouth daily. 09/05/16  Yes Glean Hess, MD  metFORMIN (GLUCOPHAGE) 500 MG tablet Take 1,000  mg by mouth daily.    Yes [provider]  nitroGLYCERIN (NITROSTAT) 0.4 MG SL tablet Place 0.4 mg under the tongue every 5 (five) minutes as needed for chest pain.   Yes [provider]  pantoprazole (PROTONIX) 40 MG tablet Take 1 tablet (40 mg total) by mouth daily. 09/05/16  Yes Glean Hess, MD  potassium chloride (KLOR-CON) 8 MEQ tablet TAKE ONE TABLET BY MOUTH TWICE DAILY 09/18/16  Yes Glean Hess, MD  rosuvastatin (CRESTOR) 40 MG tablet Take 1 tablet (40 mg total) by mouth daily. 06/15/16  Yes Minna Merritts, MD  torsemide (DEMADEX) 20 MG tablet Take 2 tablets (40 mg total) by mouth daily. Patient taking differently: Take 80 mg by mouth daily.  04/18/17 07/17/17 Yes Deboraha Sprang, MD  vitamin B-12 (CYANOCOBALAMIN) 500 MCG tablet Take 500 mcg by mouth daily.   Yes [provider]    Allergies  Allergen Reactions  . Demerol [Meperidine] Anaphylaxis    Tolerated Fentanyl 11/10/15  . Multaq [Dronedarone] Diarrhea  . Sulfa Antibiotics Anaphylaxis  . Tetracyclines & Related Other (See Comments) and Swelling    Made nose, lips,  And tongue itchy Made nose, lips,  And tongue itchy  . Metformin And Related Diarrhea    Past Surgical History:  Procedure Laterality Date  . CHOLECYSTECTOMY    . COLONOSCOPY  2015   1 polyp removed- repeat 3 years  . CORONARY ANGIOPLASTY WITH STENT PLACEMENT    . EP IMPLANTABLE DEVICE N/A 11/10/2015   Procedure: Pacemaker Implant;  Surgeon: Will [name], MD;  Location: [facility];  Service: Cardiovascular;  Laterality: N/A;  . EP IMPLANTABLE DEVICE N/A 11/11/2015   Procedure: Lead Revision/Repair;  Surgeon: Evans Lance, MD;  Location: [facility];  Service: Cardiovascular;  Laterality: N/A;  . parathyroid surgery     x 2  . THYROID SURGERY    . TOTAL SHOULDER ARTHROPLASTY Right    x 2  . VAGINAL HYSTERECTOMY      Social History   Tobacco Use  . Smoking status: Never Smoker  . Smokeless  tobacco: Never Used  Substance Use Topics  . Alcohol use: Yes    Alcohol/week: 0.0 oz  . Drug use: No     Medication list has been reviewed and updated.  PHQ 2/9 Scores 05/02/2016 12/08/2015 09/09/2015  PHQ - 2 Score 0 0 0    Physical Exam  Constitutional: She is oriented to person, place, and time. She appears well-developed. No distress.  HENT:  Head: Normocephalic and atraumatic.  Cardiovascular: Normal rate. An irregularly irregular rhythm present.  Pulmonary/Chest: Effort normal and breath sounds normal. No respiratory distress. She has no wheezes. She has no rhonchi. She has no rales.  Abdominal: Soft. She exhibits distension. She exhibits no fluid wave. There is no tenderness.  Diastasis recti noted, no distinct hernia or defect palpated  Musculoskeletal: Normal range of motion.  Neurological: She is alert and oriented to person, place, and time.  Skin: Skin is warm and dry. No rash  noted.  Psychiatric: She has a normal mood and affect. Her speech is normal and behavior is normal. Thought content normal.  Nursing note and vitals reviewed.   BP 120/80   Pulse 84   Ht 5\' 3"  (1.6 m)   Wt 202 lb (91.6 kg)   SpO2 96%   BMI 35.78 kg/m   Assessment and Plan: 1. Hypothyroidism following radioiodine therapy Check T3 and T4 - TSH+T4F+T3Free  2. CKD (chronic kidney disease) stage 3, GFR 30-59 ml/min (HCC) Recheck labs Metabolic acidosis likely due to DM and CKD May need to reduce dose of metformin - Basic metabolic panel  3. Controlled type 2 diabetes mellitus with diabetic nephropathy, without long-term current use of insulin ([place]) Attend seminar on intermittent fasting Try daily 12-16 hours fasts until cardiac issues stablize - Basic metabolic panel  4. Paroxysmal atrial fibrillation (HCC) Rate controlled Follow up with cardiology tomorrow  5. Chronic diastolic congestive heart failure ([place]) Diuresing with torsemide   No orders of the defined types were placed  in this encounter.   Partially dictated using Editor, commissioning. Any errors are unintentional.  Halina Maidens, MD [facility]  04/26/2017

## 2017-04-27 ENCOUNTER — Ambulatory Visit
Admission: RE | Admit: 2017-04-27 | Discharge: 2017-04-27 | Disposition: A | Discharge status: Home / Self Care | Payer: Medicare Other | Source: Ambulatory Visit | Attending: Internal Medicine | Admitting: Internal Medicine

## 2017-04-27 ENCOUNTER — Telehealth: Payer: Self-pay

## 2017-04-27 ENCOUNTER — Other Ambulatory Visit
Admission: RE | Admit: 2017-04-27 | Discharge: 2017-04-27 | Disposition: A | Discharge status: Home / Self Care | Payer: Medicare Other | Source: Ambulatory Visit | Attending: Internal Medicine | Admitting: Internal Medicine

## 2017-04-27 ENCOUNTER — Ambulatory Visit (INDEPENDENT_AMBULATORY_CARE_PROVIDER_SITE_OTHER): Payer: Medicare Other | Admitting: Internal Medicine

## 2017-04-27 ENCOUNTER — Encounter: Payer: Self-pay | Admitting: Internal Medicine

## 2017-04-27 VITALS — BP 110/75 | HR 75 | Temp 97.3°F | Ht 63.0 in | Wt 199.8 lb

## 2017-04-27 DIAGNOSIS — R0602 Shortness of breath: Secondary | ICD-10-CM | POA: Insufficient documentation

## 2017-04-27 DIAGNOSIS — I495 Sick sinus syndrome: Secondary | ICD-10-CM | POA: Diagnosis not present

## 2017-04-27 DIAGNOSIS — Z79899 Other long term (current) drug therapy: Secondary | ICD-10-CM

## 2017-04-27 DIAGNOSIS — Z950 Presence of cardiac pacemaker: Secondary | ICD-10-CM | POA: Diagnosis not present

## 2017-04-27 DIAGNOSIS — I700 Atherosclerosis of aorta: Secondary | ICD-10-CM | POA: Diagnosis not present

## 2017-04-27 DIAGNOSIS — J984 Other disorders of lung: Secondary | ICD-10-CM | POA: Insufficient documentation

## 2017-04-27 DIAGNOSIS — I4819 Other persistent atrial fibrillation: Secondary | ICD-10-CM

## 2017-04-27 DIAGNOSIS — I481 Persistent atrial fibrillation: Secondary | ICD-10-CM | POA: Diagnosis not present

## 2017-04-27 LAB — BASIC METABOLIC PANEL
BUN/Creatinine Ratio: 11 — ABNORMAL LOW (ref 12–28)
BUN: 19 mg/dL (ref 8–27)
CO2: 23 mmol/L (ref 20–29)
Calcium: 9.6 mg/dL (ref 8.7–10.3)
Chloride: 94 mmol/L — ABNORMAL LOW (ref 96–106)
Creatinine, Ser: 1.69 mg/dL — ABNORMAL HIGH (ref 0.57–1.00)
GFR, EST AFRICAN AMERICAN: 34 mL/min/{1.73_m2} — AB (ref >59)
GFR, EST NON AFRICAN AMERICAN: 29 mL/min/{1.73_m2} — AB (ref >59)
Glucose: 310 mg/dL — ABNORMAL HIGH (ref 65–99)
POTASSIUM: 4.3 mmol/L (ref 3.5–5.2)
SODIUM: 141 mmol/L (ref 134–144)

## 2017-04-27 LAB — TSH+T4F+T3FREE
Free T4: 3.54 ng/dL — ABNORMAL HIGH (ref 0.82–1.77)
T3 FREE: 1.8 pg/mL — AB (ref 2.0–4.4)
TSH: 0.198 u[IU]/mL — AB (ref 0.450–4.500)

## 2017-04-27 LAB — TROPONIN I: Troponin I: 0.03 ng/mL (ref <0.03)

## 2017-04-27 MED ORDER — LEVOTHYROXINE SODIUM 150 MCG PO TABS: 150.0000 ug | ORAL_TABLET | Freq: Every day | ORAL | Status: DC

## 2017-04-27 MED ORDER — LEVOTHYROXINE SODIUM 100 MCG PO TABS: 100.0000 ug | ORAL_TABLET | Freq: Every day | ORAL | Status: DC

## 2017-04-27 NOTE — Patient Instructions (Addendum)
Medication Instructions: - Your physician recommends that you continue on your current medications as directed. Please refer to the Current Medication list given to you today.  Labwork: - Your physician recommends that you have lab work today: Troponin  Procedures/Testing: - A chest x-ray takes a picture of the organs and structures inside the chest, including the heart, lungs, and blood vessels. This test can show several things, including, whether the heart is enlarges; whether fluid is building up in the lungs; and whether pacemaker / defibrillator leads are still in place.  Follow-Up: - With Dr. Curt Bears as scheduled tomorrow.   Any Additional Special Instructions Will Be Listed Below (If Applicable).     If you need a refill on your cardiac medications before your next appointment, please call your pharmacy.

## 2017-04-27 NOTE — Progress Notes (Signed)
ELECTROPHYSIOLOGY OFFICE  NOTE  Patient ID: Dawn Garcia, MRN: [id number], DOB/AGE: 77-Oct-1942 77 y.o. Admit date: (Not on file) Date of Consult: 04/27/2017  Primary Physician: Glean Hess, MD Primary Cardiologist: tg       HPI Dawn Garcia is a 77 y.o. female  Seen in followup for pacing by Dr. 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 This was complicated by right atrial lead dislodgment requiring revision.  Because of recurrent atrial fibrillation he started her on dronaderone in the fall which was complicated by diarrhea. It was her impression however that prior to its discontinuation it had been associated with a marked improvement in exercise tolerance, allowing her to walk a flight of stairs without stopping for example.  She has a history of a prior PVI.     DATE TEST    6/17    Echo   EF 65 %   11/17    Myoview   EF 54 % Anteroapical defect  8/18 Echo  EF 50-55%  possible wall motion abnormality-reviewed by TG no further workup necessary   She has had recurrent atrial fibrillation which prompted an evaluation in the emergency room 1/19; other significant symptoms with her atrial fibrillation include listlessness and fatigue.   She continues to wonder whether amiodarone is responsible for some of her symptoms.  She has not had peripheral edema but abdominal swelling.  Presumption was that she was volume overloaded related to heart failure.  We increased her diuretics without improvement.  Because of ongoing symptoms, we checked a BNP earlier this week.  It was normal.    She has had progressive shortness of breath.  She is short of breath at walking about 10 feet.  Has had episodes of severe chest heaviness frequently provoked by standing been accompanied by shortness of breath.  She does not recall whether it is distinct from her pre-stent pain  She is more short of breath this week compared to last  Date Cr K TSH LFTs Hgb  12/17   0.034    12/18 1.19 3.9       1/19 1.69 3.1   16.4  2/19 1.69 4.3 0.198 (T4 3.54)       Thromboembolic risk factors ( age  -2, DM-1, Vasc disease -1, CHF-1, Gender-1) for a CHADSVASc Score of 6  At her last visit we had also discussed consideration of repeat catheter ablation.  She has an appointment tomorrow with Dr. Carlyn Reichert  Past Medical History:  Diagnosis Date  . Atrial fibrillation ([place])   . CAD (coronary artery disease)   . Cancer ([place])    skin  . CHF (congestive heart failure) (HCC)     A-Fib  . COPD (chronic obstructive pulmonary disease) ([place])   . Diabetes mellitus without complication ([place])   . GERD (gastroesophageal reflux disease)   . Hyperlipidemia   . Hypocalcemia   . Hypokalemia   . Macular degeneration   . Myocardial infarction ([place])   . 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 Sinus node dysfunction (HCC)   . Thyroid disease   . Vitamin B12 deficiency   . Vitamin D deficiency       Surgical History:  Past Surgical History:  Procedure Laterality Date  . CHOLECYSTECTOMY    . COLONOSCOPY  2015   1 polyp removed- repeat 3 years  . CORONARY ANGIOPLASTY WITH STENT PLACEMENT    . EP IMPLANTABLE DEVICE N/A 11/10/2015   Procedure: Pacemaker Implant;  Surgeon: Will Hassell Done  Camnitz, MD;  Location: [facility];  Service: Cardiovascular;  Laterality: N/A;  . EP IMPLANTABLE DEVICE N/A 11/11/2015   Procedure: Lead Revision/Repair;  Surgeon: Evans Lance, MD;  Location: [facility];  Service: Cardiovascular;  Laterality: N/A;  . parathyroid surgery     x 2  . THYROID SURGERY    . TOTAL SHOULDER ARTHROPLASTY Right    x 2  . VAGINAL HYSTERECTOMY       Home Meds: Prior to Admission medications   Medication Sig Start Date End Date Taking? Authorizing Provider  albuterol (PROVENTIL HFA;VENTOLIN HFA) 108 (90 Base) MCG/ACT inhaler Inhale 2 puffs into the lungs every 6 (six) hours as needed for wheezing or shortness of breath.   Yes Historical Provider, MD  ARTIFICIAL TEAR OP  Place 1 drop into both eyes as needed (for dry eyes).   Yes Historical Provider, MD  Cholecalciferol (VITAMIN D3) 5000 units TABS Take 1 tablet by mouth every morning.   Yes Historical Provider, MD  furosemide (LASIX) 20 MG tablet Take 60 mg by mouth every morning.   Yes Historical Provider, MD  isosorbide mononitrate (IMDUR) 30 MG 24 hr tablet Take 1 tablet (30 mg total) by mouth daily. 02/11/16  Yes Will [name], MD  levothyroxine (SYNTHROID, LEVOTHROID) 150 MCG tablet Take 1 tablet (150 mcg total) by mouth daily. 12/08/15  Yes Glean Hess, MD  Melatonin 5 MG TABS Take 1 tablet by mouth at bedtime.   Yes Historical Provider, MD  metFORMIN (GLUCOPHAGE) 500 MG tablet Take 1 tablet (500 mg total) by mouth 2 (two) times daily with a meal. 09/16/15  Yes Adline Potter, MD  metoprolol succinate (TOPROL XL) 25 MG 24 hr tablet Take 1 tablet (25 mg total) by mouth daily. 02/11/16  Yes Will [name], MD  nitroGLYCERIN (NITROSTAT) 0.4 MG SL tablet Place 0.4 mg under the tongue every 5 (five) minutes as needed for chest pain.   Yes Historical Provider, MD  pantoprazole (PROTONIX) 40 MG tablet Take 1 tablet (40 mg total) by mouth daily. 10/16/15  Yes Juline Patch, MD  potassium chloride (KLOR-CON) 8 MEQ tablet Take 1 tablet (8 mEq total) by mouth 2 (two) times daily. 12/21/15  Yes Glean Hess, MD  rosuvastatin (CRESTOR) 40 MG tablet Take 1 tablet (40 mg total) by mouth daily. 09/18/15  Yes Minna Merritts, MD  sitaGLIPtin (JANUVIA) 50 MG tablet Take 50 mg by mouth daily.   Yes Historical Provider, MD  vitamin B-12 (CYANOCOBALAMIN) 500 MCG tablet Take 500 mcg by mouth daily.   Yes Historical Provider, MD  warfarin (COUMADIN) 7.5 MG tablet Take 1 tablet (7.5 mg total) by mouth daily. 01/20/16  Yes Minna Merritts, MD    Allergies:  Allergies  Allergen Reactions  . Demerol [Meperidine] Anaphylaxis    Tolerated Fentanyl 11/10/15  . Multaq [Dronedarone] Diarrhea  . Sulfa Antibiotics  Anaphylaxis  . Tetracyclines & Related Other (See Comments) and Swelling    Made nose, lips,  And tongue itchy Made nose, lips,  And tongue itchy  . Metformin And Related Diarrhea    Social History   Socioeconomic History  . Marital status: Married    Spouse name: Not on file  . Number of children: Not on file  . Years of education: Not on file  . Highest education level: Not on file  Social Needs  . Financial resource strain: Not on file  . Food insecurity - worry: Not on file  .  Food insecurity - inability: Not on file  . Transportation needs - medical: Not on file  . Transportation needs - non-medical: Not on file  Occupational History  . Occupation: Retired  Tobacco Use  . Smoking status: Never Smoker  . Smokeless tobacco: Never Used  Substance and Sexual Activity  . Alcohol use: Yes    Alcohol/week: 0.0 oz  . Drug use: No  . Sexual activity: Not on file  Other Topics Concern  . Not on file  Social History Narrative   Lives in [place] w/ husband.     Family History  Problem Relation Age of Onset  . Thyroid disease Mother 50       3 days after surgery  . Heart defect Father 4       ?ascending aortic aneurysm     ROS:  Please see the history of present illness.     All other systems reviewed and negative.   BP 110/75 (BP Location: Left Arm, Patient Position: Sitting, Cuff Size: Normal)   Pulse 75   Temp (!) 97.3 F (36.3 C)   Ht 5\' 3"  (1.6 m)   Wt 199 lb 12 oz (90.6 kg)   SpO2 98%   BMI 35.38 kg/m   Well developed and Morbidly obese short of breath going from chair to table  HENT normal Neck supple with JVP 6-7 +HJR mild Clear Regular rate and rhythm, no murmurs or gallops Abd-soft with active BS No Clubbing cyanosis edema Skin-warm and dry A & Oriented  Grossly normal sensory and motor function    Labs: Cardiac Enzymes No results for input(s): CKTOTAL, CKMB, TROPONINI in the last 72 hours. CBC Lab Results  Component Value Date   WBC  8.9 03/25/2017   HGB 16.4 (H) 03/25/2017   HCT 48.8 (H) 03/25/2017   MCV 84.5 03/25/2017   PLT 210 03/25/2017   PROTIME: No results for input(s): LABPROT, INR in the last 72 hours. Chemistry  Recent Labs  Lab 04/26/17 1642  NA 141  K 4.3  CL 94*  CO2 23  BUN 19  CREATININE 1.69*  CALCIUM 9.6  GLUCOSE 310*   Lipids Lab Results  Component Value Date   CHOL 127 04/19/2016   HDL 42 04/19/2016   LDLCALC 43 04/19/2016   TRIG 209 (H) 04/19/2016   BNP No results found for: PROBNP Thyroid Function Tests: Recent Labs    04/26/17 1642  TSH 0.198*  T3FREE 1.8*   Miscellaneous No results found for: DDIMER  Radiology/Studies:  No results found.  EKG: Atrial fibrillation with controlled ventricular response--81 Striking T wave changes V3-V6 segment depression, 2 3 and aVF There is evidence of intermittent ventricular pacing  Assessment and Plan:  Sick Sinus syndrome  Persistent Atrial Fib with prior PVI  Pacemaker St Jude   OSA  P-wave oversensing resulting false detection of atrial fibrillation  HFpEF   chronic   Coronary artery disease with prior stenting  Renal insufficiency-improved   Abnormal ECG with striking T wave changes?  T wave memory  Hyperthyroidism-iatrogenic  She is increasingly symptomatic.  It is not clear to me as to the cause of this.  There is not evidence of volume overload physical examination.  Her BNP is normal although sensitivity is decreased with obesity; it is also increased with age  Her atrial fibrillation is paroxysmal.  It is occurring about 20-30% of the time although in the last 48 hours it has been quite persistent.  There is no cough but she  has raised the question again as to amiodarone.  Clearly the amiodarone is not sufficient as an antiarrhythmic drug.  Stop it and will need to play close attention to rate control in the interim.  Her electrocardiogram strikingly abnormal.  I suspect it is T wave memory we will check  a troponin.  We will also obtain a chest x-ray.  Further evaluation might require CT scanning.  It is unlikely that she has a pulmonary embolism on anticoagulation but..  She has an appointment tomorrow with Dr. Carlyn Reichert for consideration of repeat ablation.  If her symptoms continue to worsen I have suggested she may need admission.  We have also discussed the right and left heart catheterization given the worsening symptoms and question congestive heart failure  She is also hyperthyroid.  We have recommended a decrease in her Synthroid dose.  She is disinclined.  She will follow-up with her PCP  More than 50% of 40 min was spent in counseling related to the above        Virl Axe

## 2017-04-27 NOTE — Telephone Encounter (Signed)
Lab called with critical Trop of 0.03, Dr Caryl Comes was made aware. No new orders at this time.

## 2017-04-27 NOTE — Progress Notes (Signed)
Electrophysiology Office Note   Date:  04/28/2017   ID:  Dawn Garcia, DOB 1940/12/19, MRN [id number]  PCP:  Glean Hess, MD  Cardiologist:  Rockey Situ Primary Electrophysiologist:  Meridee Score, MD    Chief Complaint  Patient presents with  . Pacemaker Check    PAF/Sick sinus syndrome     History of Present Illness: Dawn Garcia is a 77 y.o. female who presents today for electrophysiology evaluation.   PMHx of CAD (remote stent, patent by cath 2014), PAFib reportedly with an ablation in 2013, COPD, DM, HLD, hypothyroidism, reported history of bradycardia. Was noted to have 5 second pauses on a monitor and a dual chamber pacemaker was placed 11/09/15. Had RA lead revision due to dislodgement.   Today, denies symptoms of orthopnea, PND, lower extremity edema, claudication, presyncope, syncope, bleeding, or neurologic sequela. The patient is tolerating medications without difficulties.  He has been having significant symptoms of fatigue, weakness, and shortness of breath.  She recently saw Dr. Caryl Comes yesterday who did a chest x-ray, BNP, and troponin which were all negative.  She continues to have significant symptoms of shortness of breath, unable to do many of her daily activities.  She cannot get to the bathroom or bathe herself at this time.   Past Medical History:  Diagnosis Date  . Atrial fibrillation ([place])   . CAD (coronary artery disease)   . Cancer ([place])    skin  . CHF (congestive heart failure) (HCC)     A-Fib  . COPD (chronic obstructive pulmonary disease) ([place])   . Diabetes mellitus without complication ([place])   . GERD (gastroesophageal reflux disease)   . Hyperlipidemia   . Hypocalcemia   . Hypokalemia   . Macular degeneration   . Myocardial infarction ([place])   . 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 Sinus node dysfunction (HCC)   . Thyroid disease   . Vitamin B12 deficiency   . Vitamin D deficiency    Past Surgical History:   Procedure Laterality Date  . CHOLECYSTECTOMY    . COLONOSCOPY  2015   1 polyp removed- repeat 3 years  . CORONARY ANGIOPLASTY WITH STENT PLACEMENT    . EP IMPLANTABLE DEVICE N/A 11/10/2015   Procedure: Pacemaker Implant;  Surgeon: Rakayla Ricklefs [name], MD;  Location: [facility];  Service: Cardiovascular;  Laterality: N/A;  . EP IMPLANTABLE DEVICE N/A 11/11/2015   Procedure: Lead Revision/Repair;  Surgeon: Evans Lance, MD;  Location: [facility];  Service: Cardiovascular;  Laterality: N/A;  . parathyroid surgery     x 2  . THYROID SURGERY    . TOTAL SHOULDER ARTHROPLASTY Right    x 2  . VAGINAL HYSTERECTOMY       Current Outpatient Medications  Medication Sig Dispense Refill  . albuterol (PROVENTIL HFA;VENTOLIN HFA) 108 (90 Base) MCG/ACT inhaler Inhale 2 puffs into the lungs every 6 (six) hours as needed for wheezing or shortness of breath.    Marland Kitchen apixaban (ELIQUIS) 5 MG TABS tablet Take 1 tablet (5 mg total) 2 (two) times daily by mouth. 180 tablet 3  . b complex vitamins tablet Take 1 tablet by mouth daily.    . Biotin 1000 MCG CHEW Chew 1,000 mcg by mouth daily.    . Cholecalciferol (VITAMIN D3) 5000 units TABS Take 1 tablet by mouth every morning.    . empagliflozin (JARDIANCE) 10 MG TABS tablet Take 10 mg by mouth daily. 90 tablet 3  . levothyroxine (  SYNTHROID, LEVOTHROID) 150 MCG tablet Take 1 tablet (150 mcg total) by mouth daily before breakfast.    . metFORMIN (GLUCOPHAGE) 500 MG tablet Take 500 mg by mouth 2 (two) times daily with a meal.     . nitroGLYCERIN (NITROSTAT) 0.4 MG SL tablet Place 0.4 mg under the tongue every 5 (five) minutes as needed for chest pain.    . pantoprazole (PROTONIX) 40 MG tablet Take 1 tablet (40 mg total) by mouth daily. 90 tablet 3  . potassium chloride (KLOR-CON) 8 MEQ tablet TAKE ONE TABLET BY MOUTH TWICE DAILY 180 tablet 2  . rosuvastatin (CRESTOR) 40 MG tablet Take 1 tablet (40 mg total) by mouth daily. 90 tablet 2  . torsemide  (DEMADEX) 20 MG tablet Take 60 mg by mouth daily.    . vitamin B-12 (CYANOCOBALAMIN) 500 MCG tablet Take 500 mcg by mouth daily.     No current facility-administered medications for this visit.     Allergies:   Demerol [meperidine]; Multaq [dronedarone]; Sulfa antibiotics; Tetracyclines & related; and Metformin and related   Social History:  The patient  reports that  has never smoked. she has never used smokeless tobacco. She reports that she drinks alcohol. She reports that she does not use drugs.   Family History:  The patient's family history includes Heart defect (age of onset: 68) in her father; Thyroid disease (age of onset: 13) in her mother.    ROS:  Please see the history of present illness.   Otherwise, review of systems is positive for appetite change, fatigue, chest pain, shortness of breath, abdominal swelling, constipation, diarrhea, back pain, dizziness.   All other systems are reviewed and negative.   PHYSICAL EXAM: VS:  BP 128/80   Pulse 63   Ht 5\' 3"  (1.6 m)   Wt 198 lb 9.6 oz (90.1 kg)   SpO2 95%   BMI 35.18 kg/m  , BMI Body mass index is 35.18 kg/m. GEN: Well nourished, well developed, in no acute distress  HEENT: normal  Neck: no JVD, carotid bruits, or masses Cardiac: RRR; no murmurs, rubs, or gallops,no edema  Respiratory:  clear to auscultation bilaterally, normal work of breathing GI: soft, nontender, nondistended, + BS MS: no deformity or atrophy  Skin: warm and dry, device site well healed Neuro:  Strength and sensation are intact Psych: euthymic mood, full affect  EKG:  EKG is not ordered today. Personal review of the ekg ordered 04/28/17 shows atrial fibrillation, intermittent V pacing  Personal review of the device interrogation yesterday. Results in [place]: 03/25/2017: Hemoglobin 16.4; Platelets 210 04/18/2017: ALT 27; Magnesium 2.4 04/25/2017: B Natriuretic Peptide 57.0 04/26/2017: BUN 19; Creatinine, Ser 1.69; Potassium 4.3;  Sodium 141; TSH 0.198    Lipid Panel     Component Value Date/Time   CHOL 127 04/19/2016 0857   TRIG 209 (H) 04/19/2016 0857   HDL 42 04/19/2016 0857   CHOLHDL 3.0 04/19/2016 0857   LDLCALC 43 04/19/2016 0857     Wt Readings from Last 3 Encounters:  04/28/17 198 lb 9.6 oz (90.1 kg)  04/27/17 199 lb 12 oz (90.6 kg)  04/26/17 202 lb (91.6 kg)      Other studies Reviewed: Additional studies/ records that were reviewed today include: TTE 09/02/15  Review of the above records today demonstrates:  - Left ventricle: The cavity size was normal. Posterior wall   thickness was increased.. Systolic function was normal. The   estimated ejection fraction was in the  range of 60% to 65%. Wall   motion was normal; there were no regional wall motion   abnormalities. Doppler parameters are consistent with abnormal   left ventricular relaxation (grade 1 diastolic dysfunction). - Aortic valve: There was trivial regurgitation. - Left atrium: The atrium was mildly dilated.  01/22/16 Myoview  Nuclear stress EF: 54%.  Defect 1: There is a small defect of moderate severity present in the apical anterior and apex location.  This is a low risk study.  Findings consistent with prior myocardial infarction.  The left ventricular ejection fraction is mildly decreased (45-54%).  CXR - personally reviewed Scarring left base. No edema or consolidation. Heart size normal. Pacemaker leads attached to right atrium and right ventricle. Aortic atherosclerosis present. No adenopathy evident.  ASSESSMENT AND PLAN:  1.  Sick sinus syndrome: Post Saint Jude dual-chamber pacemaker.  Device functioning appropriately.  No changes.  2. SOB: She does not appear to be volume overloaded on exam.  BNP is also negative.  Device interrogation does show that she is in atrial fibrillation.  This could be the cause of her shortness of breath.  Due to this, we Ulyess Muto plan to send her to the emergency room for possible  admission to the hospital.  She may require cardioversion in the emergency room if she is still in atrial fibrillation.  3. Sleep apnea: Compliant with CPAP  4. Paroxysmal atrial fibrillation: Device interrogation yesterday shows atrial fibrillation.  We Kadence Mikkelson plan for admission to the hospital as she is quite short of breath.  Did discuss with her the possibility of ablation.  She is interested in ablation and Akacia Boltz plan for this once her workup for shortness of breath has been completed.  She did have a prior ablation in [place].  We Archana Eckman try and get the results from that.  This patients CHA2DS2-VASc Score and unadjusted Ischemic Stroke Rate (% per year) is equal to 4.8 % stroke rate/year from a score of 4  Above score calculated as 1 point each if present [CHF, HTN, DM, Vascular=MI/PAD/Aortic Plaque, Age if 65-74, or Female] Above score calculated as 2 points each if present [Age > 75, or Stroke/TIA/TE]  5.  Coronary artery disease: Stented in 2014.  Is currently having on and off chest pain associated with her shortness of breath.  It is unclear whether this is due to atrial fibrillation.  She may require cardioversion or right and left heart catheterization.  Patient to be made on arrival to the hospital.  Current medicines are reviewed at length with the patient today.   The patient does not have concerns regarding her medicines.  The following changes were made today:  none  Labs/ tests ordered today include:  No orders of the defined types were placed in this encounter.    Disposition:   FU with Prescott Truex 3 months  Signed, Ilisha Blust [name], MD  04/28/2017 10:36 AM     [facility] [address] [place] [address] [phone] (office) [phone] (fax)

## 2017-04-28 ENCOUNTER — Encounter (HOSPITAL_COMMUNITY): Payer: Self-pay | Admitting: *Deleted

## 2017-04-28 ENCOUNTER — Inpatient Hospital Stay (HOSPITAL_COMMUNITY)
Admission: EM | Admit: 2017-04-28 | Discharge: 2017-05-04 | DRG: 286 | Disposition: A | Discharge status: Home Health | Payer: Medicare Other | Attending: Internal Medicine | Admitting: Internal Medicine

## 2017-04-28 ENCOUNTER — Ambulatory Visit (INDEPENDENT_AMBULATORY_CARE_PROVIDER_SITE_OTHER): Payer: Medicare Other | Admitting: Cardiology

## 2017-04-28 ENCOUNTER — Other Ambulatory Visit: Payer: Self-pay

## 2017-04-28 ENCOUNTER — Emergency Department (HOSPITAL_COMMUNITY): Payer: Medicare Other

## 2017-04-28 ENCOUNTER — Encounter: Payer: Self-pay | Admitting: Cardiology

## 2017-04-28 VITALS — BP 128/80 | HR 63 | Ht 63.0 in | Wt 198.6 lb

## 2017-04-28 DIAGNOSIS — I480 Paroxysmal atrial fibrillation: Secondary | ICD-10-CM

## 2017-04-28 DIAGNOSIS — I5030 Unspecified diastolic (congestive) heart failure: Secondary | ICD-10-CM | POA: Diagnosis present

## 2017-04-28 DIAGNOSIS — Z90710 Acquired absence of both cervix and uterus: Secondary | ICD-10-CM

## 2017-04-28 DIAGNOSIS — Z79890 Hormone replacement therapy (postmenopausal): Secondary | ICD-10-CM

## 2017-04-28 DIAGNOSIS — J432 Centrilobular emphysema: Secondary | ICD-10-CM | POA: Diagnosis not present

## 2017-04-28 DIAGNOSIS — N179 Acute kidney failure, unspecified: Secondary | ICD-10-CM | POA: Diagnosis not present

## 2017-04-28 DIAGNOSIS — Z6835 Body mass index (BMI) 35.0-35.9, adult: Secondary | ICD-10-CM

## 2017-04-28 DIAGNOSIS — R531 Weakness: Secondary | ICD-10-CM | POA: Diagnosis not present

## 2017-04-28 DIAGNOSIS — N183 Chronic kidney disease, stage 3 unspecified: Secondary | ICD-10-CM | POA: Diagnosis present

## 2017-04-28 DIAGNOSIS — E669 Obesity, unspecified: Secondary | ICD-10-CM | POA: Diagnosis not present

## 2017-04-28 DIAGNOSIS — K219 Gastro-esophageal reflux disease without esophagitis: Secondary | ICD-10-CM | POA: Diagnosis present

## 2017-04-28 DIAGNOSIS — R0602 Shortness of breath: Secondary | ICD-10-CM

## 2017-04-28 DIAGNOSIS — Z7984 Long term (current) use of oral hypoglycemic drugs: Secondary | ICD-10-CM

## 2017-04-28 DIAGNOSIS — R945 Abnormal results of liver function studies: Secondary | ICD-10-CM

## 2017-04-28 DIAGNOSIS — E785 Hyperlipidemia, unspecified: Secondary | ICD-10-CM | POA: Diagnosis present

## 2017-04-28 DIAGNOSIS — E782 Mixed hyperlipidemia: Secondary | ICD-10-CM | POA: Diagnosis not present

## 2017-04-28 DIAGNOSIS — Z96611 Presence of right artificial shoulder joint: Secondary | ICD-10-CM | POA: Diagnosis present

## 2017-04-28 DIAGNOSIS — R69 Illness, unspecified: Secondary | ICD-10-CM

## 2017-04-28 DIAGNOSIS — E1165 Type 2 diabetes mellitus with hyperglycemia: Secondary | ICD-10-CM

## 2017-04-28 DIAGNOSIS — G4733 Obstructive sleep apnea (adult) (pediatric): Secondary | ICD-10-CM | POA: Diagnosis not present

## 2017-04-28 DIAGNOSIS — I495 Sick sinus syndrome: Secondary | ICD-10-CM

## 2017-04-28 DIAGNOSIS — E1169 Type 2 diabetes mellitus with other specified complication: Secondary | ICD-10-CM | POA: Diagnosis present

## 2017-04-28 DIAGNOSIS — E890 Postprocedural hypothyroidism: Secondary | ICD-10-CM | POA: Diagnosis present

## 2017-04-28 DIAGNOSIS — K222 Esophageal obstruction: Secondary | ICD-10-CM

## 2017-04-28 DIAGNOSIS — J449 Chronic obstructive pulmonary disease, unspecified: Secondary | ICD-10-CM | POA: Diagnosis present

## 2017-04-28 DIAGNOSIS — E1122 Type 2 diabetes mellitus with diabetic chronic kidney disease: Secondary | ICD-10-CM | POA: Diagnosis present

## 2017-04-28 DIAGNOSIS — H3530 Unspecified macular degeneration: Secondary | ICD-10-CM | POA: Diagnosis present

## 2017-04-28 DIAGNOSIS — I5032 Chronic diastolic (congestive) heart failure: Secondary | ICD-10-CM | POA: Diagnosis not present

## 2017-04-28 DIAGNOSIS — I5033 Acute on chronic diastolic (congestive) heart failure: Secondary | ICD-10-CM | POA: Diagnosis present

## 2017-04-28 DIAGNOSIS — Z950 Presence of cardiac pacemaker: Secondary | ICD-10-CM

## 2017-04-28 DIAGNOSIS — I130 Hypertensive heart and chronic kidney disease with heart failure and stage 1 through stage 4 chronic kidney disease, or unspecified chronic kidney disease: Secondary | ICD-10-CM | POA: Diagnosis not present

## 2017-04-28 DIAGNOSIS — R079 Chest pain, unspecified: Secondary | ICD-10-CM | POA: Diagnosis not present

## 2017-04-28 DIAGNOSIS — Z79899 Other long term (current) drug therapy: Secondary | ICD-10-CM

## 2017-04-28 DIAGNOSIS — E860 Dehydration: Secondary | ICD-10-CM | POA: Diagnosis present

## 2017-04-28 DIAGNOSIS — R7989 Other specified abnormal findings of blood chemistry: Secondary | ICD-10-CM

## 2017-04-28 DIAGNOSIS — I2510 Atherosclerotic heart disease of native coronary artery without angina pectoris: Secondary | ICD-10-CM | POA: Diagnosis present

## 2017-04-28 DIAGNOSIS — Z7901 Long term (current) use of anticoagulants: Secondary | ICD-10-CM

## 2017-04-28 DIAGNOSIS — I25118 Atherosclerotic heart disease of native coronary artery with other forms of angina pectoris: Secondary | ICD-10-CM | POA: Diagnosis not present

## 2017-04-28 DIAGNOSIS — E876 Hypokalemia: Secondary | ICD-10-CM | POA: Diagnosis present

## 2017-04-28 DIAGNOSIS — I252 Old myocardial infarction: Secondary | ICD-10-CM

## 2017-04-28 DIAGNOSIS — Z955 Presence of coronary angioplasty implant and graft: Secondary | ICD-10-CM

## 2017-04-28 LAB — CBC
HEMATOCRIT: 44.5 % (ref 36.0–46.0)
HEMOGLOBIN: 14.9 g/dL (ref 12.0–15.0)
MCH: 28.8 pg (ref 26.0–34.0)
MCHC: 33.5 g/dL (ref 30.0–36.0)
MCV: 86.1 fL (ref 78.0–100.0)
Platelets: 193 10*3/uL (ref 150–400)
RBC: 5.17 MIL/uL — AB (ref 3.87–5.11)
RDW: 15.2 % (ref 11.5–15.5)
WBC: 10.1 10*3/uL (ref 4.0–10.5)

## 2017-04-28 LAB — URINALYSIS, COMPLETE (UACMP) WITH MICROSCOPIC
Bilirubin Urine: NEGATIVE
Glucose, UA: >=500 mg/dL — AB
Hgb urine dipstick: NEGATIVE
KETONES UR: NEGATIVE mg/dL
Leukocytes, UA: NEGATIVE
Nitrite: NEGATIVE
PH: 7 (ref 5.0–8.0)
Protein, ur: 30 mg/dL — AB
Specific Gravity, Urine: 1.012 (ref 1.005–1.030)
Squamous Epithelial / LPF: NONE SEEN

## 2017-04-28 LAB — TROPONIN I: TROPONIN I: 0.03 ng/mL — AB (ref <0.03)

## 2017-04-28 LAB — I-STAT TROPONIN, ED: TROPONIN I, POC: 0 ng/mL (ref 0.00–0.08)

## 2017-04-28 LAB — BASIC METABOLIC PANEL
ANION GAP: 18 — AB (ref 5–15)
BUN: 23 mg/dL — ABNORMAL HIGH (ref 6–20)
CALCIUM: 9.6 mg/dL (ref 8.9–10.3)
CO2: 23 mmol/L (ref 22–32)
Chloride: 93 mmol/L — ABNORMAL LOW (ref 101–111)
Creatinine, Ser: 2.02 mg/dL — ABNORMAL HIGH (ref 0.44–1.00)
GFR, EST AFRICAN AMERICAN: 26 mL/min — AB (ref >60)
GFR, EST NON AFRICAN AMERICAN: 23 mL/min — AB (ref >60)
Glucose, Bld: 418 mg/dL — ABNORMAL HIGH (ref 65–99)
POTASSIUM: 3.5 mmol/L (ref 3.5–5.1)
Sodium: 134 mmol/L — ABNORMAL LOW (ref 135–145)

## 2017-04-28 LAB — CUP PACEART INCLINIC DEVICE CHECK
Date Time Interrogation Session: 20190215174602
Implantable Lead Implant Date: 20170829
Implantable Lead Location: 753859
MDC IDC LEAD IMPLANT DT: 20170829
MDC IDC LEAD LOCATION: 753860
MDC IDC PG IMPLANT DT: 20170829
Pulse Gen Model: 2272
Pulse Gen Serial Number: 7945290

## 2017-04-28 LAB — GLUCOSE, CAPILLARY: Glucose-Capillary: 223 mg/dL — ABNORMAL HIGH (ref 65–99)

## 2017-04-28 LAB — INFLUENZA PANEL BY PCR (TYPE A & B)
INFLAPCR: NEGATIVE
Influenza B By PCR: NEGATIVE

## 2017-04-28 LAB — CBG MONITORING, ED: Glucose-Capillary: 251 mg/dL — ABNORMAL HIGH (ref 65–99)

## 2017-04-28 LAB — D-DIMER, QUANTITATIVE: D-Dimer, Quant: <0.27 ug/mL-FEU (ref 0.00–0.50)

## 2017-04-28 LAB — HEMOGLOBIN A1C
Hgb A1c MFr Bld: 9.4 % — ABNORMAL HIGH (ref 4.8–5.6)
MEAN PLASMA GLUCOSE: 223.08 mg/dL

## 2017-04-28 MED ORDER — INSULIN GLARGINE 100 UNIT/ML ~~LOC~~ SOLN
10.0000 [IU] | Freq: Every day | SUBCUTANEOUS | Status: DC
  Administered 2017-04-28: 10 [IU] via SUBCUTANEOUS
  Filled 2017-04-28 (×2): qty 0.1

## 2017-04-28 MED ORDER — ROSUVASTATIN CALCIUM 40 MG PO TABS
40.0000 mg | ORAL_TABLET | Freq: Every day | ORAL | Status: DC
  Administered 2017-04-28 – 2017-05-03 (×6): 40 mg via ORAL
  Filled 2017-04-28 (×6): qty 1

## 2017-04-28 MED ORDER — VITAMIN D 1000 UNITS PO TABS
5000.0000 [IU] | ORAL_TABLET | Freq: Every morning | ORAL | Status: DC
  Administered 2017-04-29 – 2017-05-04 (×6): 5000 [IU] via ORAL
  Filled 2017-04-28 (×7): qty 5

## 2017-04-28 MED ORDER — INSULIN ASPART 100 UNIT/ML ~~LOC~~ SOLN
10.0000 [IU] | Freq: Once | SUBCUTANEOUS | Status: AC
  Administered 2017-04-28: 10 [IU] via INTRAVENOUS
  Filled 2017-04-28: qty 1

## 2017-04-28 MED ORDER — POLYETHYLENE GLYCOL 3350 17 G PO PACK
17.0000 g | PACK | Freq: Every day | ORAL | Status: DC | PRN
  Administered 2017-04-30 – 2017-05-03 (×3): 17 g via ORAL
  Filled 2017-04-28 (×3): qty 1

## 2017-04-28 MED ORDER — ONDANSETRON HCL 4 MG PO TABS: 4.0000 mg | ORAL_TABLET | Freq: Four times a day (QID) | ORAL | Status: DC | PRN

## 2017-04-28 MED ORDER — ONDANSETRON HCL 4 MG/2ML IJ SOLN: 4.0000 mg | Freq: Four times a day (QID) | INTRAMUSCULAR | Status: DC | PRN

## 2017-04-28 MED ORDER — VITAMIN B-12 1000 MCG PO TABS
500.0000 ug | ORAL_TABLET | Freq: Every day | ORAL | Status: DC
  Administered 2017-04-29 – 2017-05-04 (×6): 500 ug via ORAL
  Filled 2017-04-28 (×7): qty 1

## 2017-04-28 MED ORDER — SODIUM CHLORIDE 0.9 % IV SOLN
Freq: Once | INTRAVENOUS | Status: AC
  Administered 2017-04-28: 18:00:00 via INTRAVENOUS

## 2017-04-28 MED ORDER — PANTOPRAZOLE SODIUM 40 MG PO TBEC
40.0000 mg | DELAYED_RELEASE_TABLET | Freq: Every day | ORAL | Status: DC
  Administered 2017-04-29 – 2017-05-04 (×6): 40 mg via ORAL
  Filled 2017-04-28 (×6): qty 1

## 2017-04-28 MED ORDER — LEVALBUTEROL HCL 0.63 MG/3ML IN NEBU: 0.6300 mg | INHALATION_SOLUTION | Freq: Four times a day (QID) | RESPIRATORY_TRACT | Status: DC | PRN

## 2017-04-28 MED ORDER — NITROGLYCERIN 0.4 MG SL SUBL: 0.4000 mg | SUBLINGUAL_TABLET | SUBLINGUAL | Status: DC | PRN

## 2017-04-28 MED ORDER — APIXABAN 5 MG PO TABS
5.0000 mg | ORAL_TABLET | Freq: Two times a day (BID) | ORAL | Status: DC
  Administered 2017-04-28 – 2017-05-02 (×8): 5 mg via ORAL
  Filled 2017-04-28 (×8): qty 1

## 2017-04-28 MED ORDER — B COMPLEX PO TABS: 1.0000 | ORAL_TABLET | Freq: Every day | ORAL | Status: DC

## 2017-04-28 MED ORDER — GUAIFENESIN ER 600 MG PO TB12
600.0000 mg | ORAL_TABLET | Freq: Two times a day (BID) | ORAL | Status: DC
  Administered 2017-04-29 – 2017-05-03 (×7): 600 mg via ORAL
  Filled 2017-04-28 (×8): qty 1

## 2017-04-28 MED ORDER — INSULIN ASPART 100 UNIT/ML ~~LOC~~ SOLN
0.0000 [IU] | Freq: Three times a day (TID) | SUBCUTANEOUS | Status: DC
  Administered 2017-04-29: 11 [IU] via SUBCUTANEOUS
  Administered 2017-04-29: 3 [IU] via SUBCUTANEOUS
  Administered 2017-04-29: 5 [IU] via SUBCUTANEOUS
  Administered 2017-04-30: 3 [IU] via SUBCUTANEOUS
  Administered 2017-04-30: 11 [IU] via SUBCUTANEOUS
  Administered 2017-04-30: 3 [IU] via SUBCUTANEOUS
  Administered 2017-05-01: 8 [IU] via SUBCUTANEOUS
  Administered 2017-05-01: 3 [IU] via SUBCUTANEOUS
  Administered 2017-05-02: 2 [IU] via SUBCUTANEOUS
  Administered 2017-05-02: 5 [IU] via SUBCUTANEOUS
  Administered 2017-05-02 – 2017-05-03 (×3): 2 [IU] via SUBCUTANEOUS
  Administered 2017-05-04: 8 [IU] via SUBCUTANEOUS
  Administered 2017-05-04: 3 [IU] via SUBCUTANEOUS

## 2017-04-28 MED ORDER — B COMPLEX-C PO TABS
1.0000 | ORAL_TABLET | Freq: Every day | ORAL | Status: DC
  Administered 2017-04-29 – 2017-05-04 (×6): 1 via ORAL
  Filled 2017-04-28 (×6): qty 1

## 2017-04-28 MED ORDER — LEVOTHYROXINE SODIUM 75 MCG PO TABS
150.0000 ug | ORAL_TABLET | Freq: Every day | ORAL | Status: DC
  Administered 2017-04-29 – 2017-05-04 (×6): 150 ug via ORAL
  Filled 2017-04-28 (×6): qty 2

## 2017-04-28 MED ORDER — ACETAMINOPHEN 650 MG RE SUPP: 650.0000 mg | Freq: Four times a day (QID) | RECTAL | Status: DC | PRN

## 2017-04-28 MED ORDER — TRAMADOL HCL 50 MG PO TABS
50.0000 mg | ORAL_TABLET | Freq: Four times a day (QID) | ORAL | Status: DC | PRN
  Administered 2017-04-30 – 2017-05-02 (×2): 50 mg via ORAL
  Filled 2017-04-28 (×4): qty 1

## 2017-04-28 MED ORDER — DOCUSATE SODIUM 100 MG PO CAPS
100.0000 mg | ORAL_CAPSULE | Freq: Every day | ORAL | Status: DC
  Administered 2017-04-28 – 2017-05-03 (×6): 100 mg via ORAL
  Filled 2017-04-28 (×7): qty 1

## 2017-04-28 MED ORDER — ACETAMINOPHEN 325 MG PO TABS
650.0000 mg | ORAL_TABLET | Freq: Four times a day (QID) | ORAL | Status: DC | PRN
  Administered 2017-04-29: 650 mg via ORAL
  Filled 2017-04-28: qty 2

## 2017-04-28 NOTE — ED Notes (Signed)
Insulin injected sub q per edp verbal order

## 2017-04-28 NOTE — H&P (Signed)
History and Physical    Dawn Garcia DZH:[id number] DOB: [birth year] DOA: 04/28/2017  PCP: Glean Hess, MD   Patient coming from: Home  Chief Complaint: Shortness of Breath  HPI: Dawn Garcia is a 77 y.o. 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 States she feels like this when she goes into Atrial Fibrillation and has had an ablation in the past. Was recently evaluated by EP Cardiology by Dr. Caryl Comes yesterday and Dr. Curt Bears today and was sent to the ED for evaluation given concern that SOB maybe something else besides PAF. She endorses no leg swelling and states she was recently taken off of Amiodarone 2 days ago. Has been having intermittent Chest Pressure that is left side associated with this SOB. Admits to being Lightheaded and sometimes dizzy with these episodes and also has tremendous fatigue and weakness. No Nausea or vomting. [facility] was called to evaluate and admit patient for Acute Dyspnea/Dyspnea on Exertion and CP r/o ACS. Cardiology had already evaluated the patient.   ED Course: Had basic blood work done and Cardiology was consulted.   Review of Systems: As per HPI otherwise 10 point review of systems negative.   Past Medical History:  Diagnosis Date  . Atrial fibrillation ([place])   . CAD (coronary artery disease)   . Cancer ([place])    skin  . CHF (congestive heart failure) (HCC)     A-Fib  . COPD (chronic obstructive pulmonary disease) ([place])   . Diabetes mellitus without complication ([place])   . GERD (gastroesophageal reflux disease)   . Hyperlipidemia   . Hypocalcemia   . Hypokalemia   . Macular degeneration   . Myocardial infarction ([place])   . 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 Sinus node dysfunction (HCC)   . Thyroid disease   .  Vitamin B12 deficiency   . Vitamin D deficiency    Past Surgical History:  Procedure Laterality Date  . CHOLECYSTECTOMY    . COLONOSCOPY  2015   1 polyp removed- repeat 3 years  . CORONARY ANGIOPLASTY WITH STENT PLACEMENT    . EP IMPLANTABLE DEVICE N/A 11/10/2015   Procedure: Pacemaker Implant;  Surgeon: Will [name], MD;  Location: [facility];  Service: Cardiovascular;  Laterality: N/A;  . EP IMPLANTABLE DEVICE N/A 11/11/2015   Procedure: Lead Revision/Repair;  Surgeon: Evans Lance, MD;  Location: [facility];  Service: Cardiovascular;  Laterality: N/A;  . parathyroid surgery     x 2  . THYROID SURGERY    . TOTAL SHOULDER ARTHROPLASTY Right    x 2  . VAGINAL HYSTERECTOMY     SOCIAL HISTORY  reports that  has never smoked. she has never used smokeless tobacco. She reports that she drinks alcohol. She reports that she does not use drugs. Patient is a former Therapist, sports.   Allergies  Allergen Reactions  . Demerol [Meperidine] Anaphylaxis    Tolerated Fentanyl 11/10/15  . Multaq [Dronedarone] Diarrhea  . Sulfa Antibiotics Anaphylaxis  . Tetracyclines & Related Other (See Comments) and Swelling    Made nose, lips,  And tongue itchy Made nose, lips,  And tongue itchy  . Metformin And Related Diarrhea   Family History  Problem Relation Age of Onset  . Thyroid disease Mother 68       3 days after  surgery  . Heart defect Father 23       ?ascending aortic aneurysm   Prior to Admission medications   Medication Sig Start Date End Date Taking? Authorizing Provider  albuterol (PROVENTIL HFA;VENTOLIN HFA) 108 (90 Base) MCG/ACT inhaler Inhale 2 puffs into the lungs every 6 (six) hours as needed for wheezing or shortness of breath.   Yes [provider]  apixaban (ELIQUIS) 5 MG TABS tablet Take 1 tablet (5 mg total) 2 (two) times daily by mouth. 01/25/17  Yes Deboraha Sprang, MD  b complex vitamins tablet Take 1 tablet by mouth daily.   Yes [provider]    Biotin 1000 MCG CHEW Chew 1,000 mcg by mouth daily.   Yes [provider]  Cholecalciferol (VITAMIN D3) 5000 units TABS Take 1 tablet by mouth every morning.   Yes [provider]  docusate sodium (COLACE) 100 MG capsule Take 100 mg by mouth daily.   Yes [provider]  empagliflozin (JARDIANCE) 10 MG TABS tablet Take 10 mg by mouth daily. 01/09/17  Yes Glean Hess, MD  levothyroxine (SYNTHROID, LEVOTHROID) 150 MCG tablet Take 1 tablet (150 mcg total) by mouth daily before breakfast. 04/27/17  Yes Deboraha Sprang, MD  metFORMIN (GLUCOPHAGE) 500 MG tablet Take 500 mg by mouth 2 (two) times daily with a meal.    Yes [provider]  Multiple Vitamins-Minerals (ICAPS AREDS 2) CAPS Take 1 capsule by mouth 2 (two) times daily.   Yes [provider]  nitroGLYCERIN (NITROSTAT) 0.4 MG SL tablet Place 0.4 mg under the tongue every 5 (five) minutes as needed for chest pain.   Yes [provider]  pantoprazole (PROTONIX) 40 MG tablet Take 1 tablet (40 mg total) by mouth daily. 09/05/16  Yes Glean Hess, MD  potassium chloride (KLOR-CON) 8 MEQ tablet TAKE ONE TABLET BY MOUTH TWICE DAILY 09/18/16  Yes Glean Hess, MD  rosuvastatin (CRESTOR) 40 MG tablet Take 1 tablet (40 mg total) by mouth daily. 06/15/16  Yes Minna Merritts, MD  torsemide (DEMADEX) 20 MG tablet Take 60 mg by mouth daily.   Yes [provider]  vitamin B-12 (CYANOCOBALAMIN) 500 MCG tablet Take 500 mcg by mouth daily.   Yes [provider]   Physical Exam: Vitals:   04/28/17 1630 04/28/17 1645 04/28/17 1745 04/28/17 1815  BP: (!) 143/87 126/71 131/80 133/82  Pulse: 79 77 75 75  Resp: 17 17 15 11   Temp:      TempSrc:      SpO2: 98% 96% 96% 96%  Weight:      Height:       Constitutional: WN/WD obese Caucasian female in NAD and appears calm and comfortable Eyes: Lids and conjunctivae normal, sclerae anicteric  ENMT: External Ears, Nose appear normal.  Grossly normal hearing. Mucous membranes are slightly dry. Posterior pharynx clear of any exudate or lesions.  Neck: Appears normal, supple, no cervical masses, normal ROM, no appreciable thyromegaly, no JVD Respiratory: Diminished to auscultation bilaterally, no wheezing, rales, rhonchi or crackles. Normal respiratory effort and patient is not tachypenic. No accessory muscle use.  Cardiovascular: RRR, no murmurs / rubs / gallops. S1 and S2 auscultated. No extremity edema. Abdomen: Soft, mildly tender, Distended due to body habitus. Has a ventral hernia. No appreciable hepatosplenomegaly. Bowel sounds positive.  GU: Deferred. Musculoskeletal: No clubbing / cyanosis of digits/nails. No joint deformity upper and lower extremities. Good ROM, no contractures.   Skin: No rashes, lesions, ulcers  on a limited skin eval. No induration; Warm and dry.  Neurologic: CN 2-12 grossly intact with no focal deficits. Romberg sign cerebellar reflexes not assessed.  Psychiatric: Normal judgment and insight. Alert and oriented x 3. Normal mood and appropriate affect.   Labs on Admission: I have personally reviewed following labs and imaging studies  CBC: Recent Labs  Lab 04/28/17 1107  WBC 10.1  HGB 14.9  HCT 44.5  MCV 86.1  PLT 283   Basic Metabolic Panel: Recent Labs  Lab 04/26/17 1642 04/28/17 1107  NA 141 134*  K 4.3 3.5  CL 94* 93*  CO2 23 23  GLUCOSE 310* 418*  BUN 19 23*  CREATININE 1.69* 2.02*  CALCIUM 9.6 9.6   GFR: Estimated Creatinine Clearance: 25.2 mL/min (A) (by C-G formula based on SCr of 2.02 mg/dL (H)). Liver Function Tests: No results for input(s): AST, ALT, ALKPHOS, BILITOT, PROT, ALBUMIN in the last 168 hours. No results for input(s): LIPASE, AMYLASE in the last 168 hours. No results for input(s): AMMONIA in the last 168 hours. Coagulation Profile: No results for input(s): INR, PROTIME in the last 168 hours. Cardiac Enzymes: Recent Labs  Lab 04/27/17 1319  TROPONINI  0.03*   BNP (last 3 results) No results for input(s): PROBNP in the last 8760 hours. HbA1C: No results for input(s): HGBA1C in the last 72 hours. CBG: Recent Labs  Lab 04/28/17 1751  GLUCAP 251*   Lipid Profile: No results for input(s): CHOL, HDL, LDLCALC, TRIG, CHOLHDL, LDLDIRECT in the last 72 hours. Thyroid Function Tests: Recent Labs    04/26/17 1642  TSH 0.198*  FREET4 3.54*  T3FREE 1.8*   Anemia Panel: No results for input(s): VITAMINB12, FOLATE, FERRITIN, TIBC, IRON, RETICCTPCT in the last 72 hours. Urine analysis:    Component Value Date/Time   BILIRUBINUR neg 05/02/2016 1217   PROTEINUR neg 05/02/2016 1217   UROBILINOGEN 0.2 05/02/2016 1217   NITRITE neg 05/02/2016 1217   LEUKOCYTESUR Negative 05/02/2016 1217   Sepsis Labs: !!!!!!!!!!!!!!!!!!!!!!!!!!!!!!!!!!!!!!!!!!!! @LABRCNTIP (procalcitonin:4,lacticidven:4) )No results found for this or any previous visit (from the past 240 hour(s)).   Radiological Exams on Admission: Dg Chest 2 View  Result Date: 04/27/2017 CLINICAL DATA:  Shortness of Breath EXAM: CHEST  2 VIEW COMPARISON:  March 25, 2017 FINDINGS: There is scarring in the left base region. Lungs elsewhere are clear. Heart size and pulmonary vascularity are normal. Pacemaker leads are attached to the right atrium and right ventricle. There is aortic atherosclerosis. No adenopathy. Patient is status post total shoulder replacement on the right. IMPRESSION: Scarring left base. No edema or consolidation. Heart size normal. Pacemaker leads attached to right atrium and right ventricle. Aortic atherosclerosis present. No adenopathy evident. Aortic Atherosclerosis (ICD10-I70.0). Electronically Signed   By: Lowella Grip III M.D.   On: 04/27/2017 15:22   EKG: Independently reviewed. Showed a Paced Rhythm   Assessment/Plan Active Problems:   Paroxysmal atrial fibrillation (HCC)   Obesity, Class II, BMI 35-39.9   GERD with stricture   Diastolic CHF (HCC)    CKD (chronic kidney disease) stage 3, GFR 30-59 ml/min (HCC)   COPD (chronic obstructive pulmonary disease) (HCC)   Hypothyroidism following radioiodine therapy   Coronary artery disease   Hyperlipidemia   Chest pain   SSS (sick sinus syndrome) ([place])   Uncontrolled type 2 diabetes mellitus with hyperglycemia, without long-term current use of insulin (HCC)   OSA (obstructive sleep apnea)   Acute Dyspnea/Dyspnea on Exertion -Progressively worsening; ? Related to PAF vs. COPD Marlowe Aschoff  as patient wasn't wheezing) -Place in Obs Telemetry -Check Patient for Respiratory Virus Panel and Flu -Cardiology Checking D-Dimer and if Positive will need V/Q Scan as Renal Fxn is too elevated for CTA of Chest -Xopenex PRN and added Mucinex -Check ECHOCardiogram  -CXR done yesterday showed Scarring left base. No edema or consolidation. Heart size normal. Pacemaker leads attached to right atrium and right ventricle. Aortic atherosclerosis present. No adenopathy evident  Chest Pain r/o ACS in a patient with CAD and Hx of MI -Has been having intermittent CP -C/w NTG 0.4 mg SL q71minPRN -Cycle Troponin x3  -Repeat EKG in AM -C/w Telemetry -Obtain ECHOCardiogram -Cardiology Evaluating and considering Ischemic Assessment with Cardiac Catheterization but are unsure at this point as it is contingent on Cr  Paroxysmal Atrial Fibrillation -Was found to be in Atrial Fibrillation when Device was interrogated as an outpatient and spontaneously converted to NSR today in the ED -C/w Anticoagulation with Eliquis -C/w Telemetry -Recently stopped Amiodarone given ineffectiveness of keeping NSR -Cardiology Consulted and appreciate further recommendations  OSA -C/w Home CPAP  AKI on CKD Stage 3 -Patient Took her Home Torsemide this AM and likley is Dehydrated based on Labs -Avoid Nephrotoxic Medications if Possible -BUN/Cr has worsened and went from 21/1.60 -> 23/2.02 -Will not give IVF Rehydration given easy  tendency for Patient to go into volume Overload -Continue to Monitor and Repeat CMP in AM   Uncontrolled Hyperglycemia in the Setting of Diabetes Mellitus Type 2 -Hold Jardiance and Metformin and encourage Discontinuation at D/C -Check HbA1c; Last one in September 18 was 7.8 -Start Lantus 10 units qHS given Insulin Naive and place on Moderate Novolog SSI AC -Appreciate Diabetic Education Coordinator Recommendations -Continue to Monitor CBG's  Hypothyroidism -S/p Radioactive Thyroid Ablation  -Patient recently had Thyroid studies 2 days ago which showed patient had a TSH of 0.198 (low), T3 of 1.8 (low), and Free T4 of 3.54 (high) -Upon review of patient's Medication list, patient unfortunately has been taking Biotin 1000 mcg po Daily which can alter Thyroid Assay results and can falsely elevate T4 and in some cases make TSH look lower in lab settings  -Patient has also been taking Amiodarone (recently stopped 2 days ago)  which decreases Free T3 Conversion from Free T4 -Patient's Thyroid Studies therefore are inaccurate and patient needs to be off of Biotin at least 3 days prior to Rechecking TSH, Free T3, and Free T4 -Will not check again this hospitalization and will continue her Levothyroxine 150 mcg po Daily  -Case was discussed with Dr. Buddy Duty of Endocrinology and Patient to follow up with Dr. Buddy Duty at D/C and he will check her labs and adjust medications as necessary  Chronic Diastolic CHF -Currently not Decompensated -Actually looks on the drier side -Strict I's/O's, Daily Weights -Hold Torsemide for now given AKI on CKD  Dyslipidemia/HLD -C/w Rosuvastatin 40 mg po Daily   SSS s/p PPM -C/w Telemetry -Cardiology Following   COPD -Currently do not feel she is in Exacerbation -C/w prn Xopenex Nebs  DVT prophylaxis: Anticoagulated with Apixaban  Code Status: FULL CODE Family Communication: Discussed with Daughter at bedside Disposition Plan: Anticipate D/C Home Consults called:  Cardiology Admission status: Observation Telemetry   Severity of Illness: The appropriate patient status for this patient is OBSERVATION. Observation status is judged to be reasonable and necessary in order to provide the required intensity of service to ensure the patient's safety. The patient's presenting symptoms, physical exam findings, and initial radiographic and laboratory data in the  context of their medical condition is felt to place them at decreased risk for further clinical deterioration. Furthermore, it is anticipated that the patient will be medically stable for discharge from the hospital within 2 midnights of admission. The following factors support the patient status of observation.   " The patient's presenting symptoms include Dyspnea and Chest Pain. " The physical exam findings include mild tachypenia. " The initial radiographic and laboratory data are showing patient has a mild AKI on CKD and appears dehydrated.  Kerney Elbe, D.O. [facility] Pager [phone]  If 7PM-7AM, please contact night-coverage [email or web address] Password [facility]  04/28/2017, 6:51 PM

## 2017-04-28 NOTE — ED Triage Notes (Signed)
Pt in c/o increased SOB, pt reports intermittent CP, pt c/o sore throat, pt states, "my throat hurts when I am in A fib." pt reports hx of A fib takes Eliquis, c/o SOB worsening with exertion, symptoms increasing over the last 2 wks, pt c/o lethargy, A&O x4, denies n/v/d

## 2017-04-28 NOTE — Patient Instructions (Signed)
Medication Instructions:  Your physician recommends that you continue on your current medications as directed. Please refer to the Current Medication list given to you today.  * If you need a refill on your cardiac medications before your next appointment, please call your pharmacy. *  Labwork: None ordered  Testing/Procedures: None ordered  Follow-Up: Please go straight to the hospital   Thank you for choosing [facility]!!   Trinidad Curet, RN [phone]

## 2017-04-28 NOTE — ED Provider Notes (Signed)
[facility] EMERGENCY DEPARTMENT Provider Note   CSN: [id number] Arrival date & time: 04/28/17  1058     History   Chief Complaint Chief Complaint  Patient presents with  . Shortness of Breath    HPI Dawn Garcia is a 77 y.o. female.  HPI Patient has had several weeks of increased palpitations and shortness of breath.  Feels that her energy level has been severely diminished.  She is not been able to even simple household tasks.  She reports when she builds up fluid, her abdomen becomes distended.  She denies that her lower extremities develop any edema.  She has not had calf pain or tenderness.  Patient has been working with her cardiologist for medical management to try to improve the symptoms.  She is referred today from [facility] office for admission to the hospital.  She advises they are considering doing an ablation but reportedly plan to do a catheterization first. Past Medical History:  Diagnosis Date  . Atrial fibrillation ([place])   . CAD (coronary artery disease)   . Cancer ([place])    skin  . CHF (congestive heart failure) (HCC)     A-Fib  . COPD (chronic obstructive pulmonary disease) ([place])   . Diabetes mellitus without complication ([place])   . GERD (gastroesophageal reflux disease)   . Hyperlipidemia   . Hypocalcemia   . Hypokalemia   . Macular degeneration   . Myocardial infarction ([place])   . 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 Sinus node dysfunction (HCC)   . Thyroid disease   . Vitamin B12 deficiency   . Vitamin D deficiency     Patient Active Problem List   Diagnosis Date Noted  . Lipoma of neck 09/05/2016  . 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 Bunion of great toe 09/05/2016  . Hyperlipidemia 09/18/2015  . Symptomatic bradycardia 09/18/2015  . Coronary artery disease 09/10/2015  . B12 deficiency 09/10/2015  . History of melanoma 09/09/2015  . COPD (chronic obstructive  pulmonary disease) ([place]) 09/09/2015  . Hypothyroidism following radioiodine therapy 09/09/2015  . Obesity, Class II, BMI 35-39.9 08/20/2015  . GERD with stricture 08/20/2015  . Macular degeneration 08/20/2015  . Diastolic CHF ([place]) 63/87/5643  . History of parathyroidectomy 08/20/2015  . CKD (chronic kidney disease) stage 3, GFR 30-59 ml/min (HCC) 08/20/2015  . Paroxysmal atrial fibrillation ([place]) 08/19/2015  . Controlled type 2 diabetes mellitus with diabetic nephropathy, without long-term current use of insulin ([place]) 08/19/2015  . Primary gout 08/19/2015    Past Surgical History:  Procedure Laterality Date  . CHOLECYSTECTOMY    . COLONOSCOPY  2015   1 polyp removed- repeat 3 years  . CORONARY ANGIOPLASTY WITH STENT PLACEMENT    . EP IMPLANTABLE DEVICE N/A 11/10/2015   Procedure: Pacemaker Implant;  Surgeon: Will [name], MD;  Location: [facility];  Service: Cardiovascular;  Laterality: N/A;  . EP IMPLANTABLE DEVICE N/A 11/11/2015   Procedure: Lead Revision/Repair;  Surgeon: Evans Lance, MD;  Location: [facility];  Service: Cardiovascular;  Laterality: N/A;  . parathyroid surgery     x 2  . THYROID SURGERY    . TOTAL SHOULDER ARTHROPLASTY Right    x 2  . VAGINAL HYSTERECTOMY      OB History    No data available       Home Medications    Prior to Admission medications   Medication Sig Start Date End Date Taking?  Authorizing Provider  albuterol (PROVENTIL HFA;VENTOLIN HFA) 108 (90 Base) MCG/ACT inhaler Inhale 2 puffs into the lungs every 6 (six) hours as needed for wheezing or shortness of breath.   Yes [provider]  apixaban (ELIQUIS) 5 MG TABS tablet Take 1 tablet (5 mg total) 2 (two) times daily by mouth. 01/25/17  Yes Deboraha Sprang, MD  b complex vitamins tablet Take 1 tablet by mouth daily.   Yes [provider]  Biotin 1000 MCG CHEW Chew 1,000 mcg by mouth daily.   Yes [provider]  Cholecalciferol (VITAMIN  D3) 5000 units TABS Take 1 tablet by mouth every morning.   Yes [provider]  docusate sodium (COLACE) 100 MG capsule Take 100 mg by mouth daily.   Yes [provider]  empagliflozin (JARDIANCE) 10 MG TABS tablet Take 10 mg by mouth daily. 01/09/17  Yes Glean Hess, MD  levothyroxine (SYNTHROID, LEVOTHROID) 150 MCG tablet Take 1 tablet (150 mcg total) by mouth daily before breakfast. 04/27/17  Yes Deboraha Sprang, MD  metFORMIN (GLUCOPHAGE) 500 MG tablet Take 500 mg by mouth 2 (two) times daily with a meal.    Yes [provider]  Multiple Vitamins-Minerals (ICAPS AREDS 2) CAPS Take 1 capsule by mouth 2 (two) times daily.   Yes [provider]  nitroGLYCERIN (NITROSTAT) 0.4 MG SL tablet Place 0.4 mg under the tongue every 5 (five) minutes as needed for chest pain.   Yes [provider]  pantoprazole (PROTONIX) 40 MG tablet Take 1 tablet (40 mg total) by mouth daily. 09/05/16  Yes Glean Hess, MD  potassium chloride (KLOR-CON) 8 MEQ tablet TAKE ONE TABLET BY MOUTH TWICE DAILY 09/18/16  Yes Glean Hess, MD  rosuvastatin (CRESTOR) 40 MG tablet Take 1 tablet (40 mg total) by mouth daily. 06/15/16  Yes Minna Merritts, MD  torsemide (DEMADEX) 20 MG tablet Take 60 mg by mouth daily.   Yes [provider]  vitamin B-12 (CYANOCOBALAMIN) 500 MCG tablet Take 500 mcg by mouth daily.   Yes [provider]    Family History Family History  Problem Relation Age of Onset  . Thyroid disease Mother 85       3 days after surgery  . Heart defect Father 45       ?ascending aortic aneurysm    Social History Social History   Tobacco Use  . Smoking status: Never Smoker  . Smokeless tobacco: Never Used  Substance Use Topics  . Alcohol use: Yes    Alcohol/week: 0.0 oz  . Drug use: No     Allergies   Demerol [meperidine]; Multaq [dronedarone]; Sulfa antibiotics; Tetracyclines & related; and Metformin and related   Review of  Systems Review of Systems 10 Systems reviewed and are negative for acute change except as noted in the HPI.   Physical Exam Updated Vital Signs BP 126/71   Pulse 77   Temp 97.6 F (36.4 C) (Oral)   Resp 17   Ht 5\' 3"  (1.6 m)   Wt 89.8 kg (198 lb)   SpO2 96%   BMI 35.07 kg/m   Physical Exam  Constitutional: She is oriented to person, place, and time. She appears well-developed and well-nourished. No distress.  HENT:  Head: Normocephalic and atraumatic.  Mouth/Throat: Oropharynx is clear and moist.  Eyes: Conjunctivae and EOM are normal.  Neck: Neck supple.  Cardiovascular: Normal rate and regular rhythm.  No murmur heard. Pulmonary/Chest: Effort normal and breath sounds  normal. No respiratory distress.  Abdominal: Soft. There is no tenderness.  Musculoskeletal: She exhibits no edema or tenderness.  Neurological: She is alert and oriented to person, place, and time. No cranial nerve deficit. She exhibits normal muscle tone. Coordination normal.  Skin: Skin is warm and dry.  Psychiatric: She has a normal mood and affect.  Nursing note and vitals reviewed.    ED Treatments / Results  Labs (all labs ordered are listed, but only abnormal results are displayed) Labs Reviewed  BASIC METABOLIC PANEL - Abnormal; Notable for the following components:      Result Value   Sodium 134 (*)    Chloride 93 (*)    Glucose, Bld 418 (*)    BUN 23 (*)    Creatinine, Ser 2.02 (*)    GFR calc non Af Amer 23 (*)    GFR calc Af Amer 26 (*)    Anion gap 18 (*)    All other components within normal limits  CBC - Abnormal; Notable for the following components:   RBC 5.17 (*)    All other components within normal limits  RESPIRATORY PANEL BY PCR  D-DIMER, QUANTITATIVE (NOT AT [facility])  TROPONIN I  TROPONIN I  TROPONIN I  INFLUENZA PANEL BY PCR (TYPE A & B)  I-STAT TROPONIN, ED    EKG  EKG Interpretation  Date/Time:  Friday April 28 2017 11:02:51 EST Ventricular Rate:  67 PR  Interval:  192 QRS Duration: 92 QT Interval:  522 QTC Calculation: 551 R Axis:   -9 Text Interpretation:  Atrial-sensed ventricular-paced rhythm Abnormal ECG No significant change since last tracing Confirmed by Duffy Bruce [phone]) on 04/28/2017 11:09:43 AM       Radiology Dg Chest 2 View  Result Date: 04/27/2017 CLINICAL DATA:  Shortness of Breath EXAM: CHEST  2 VIEW COMPARISON:  March 25, 2017 FINDINGS: There is scarring in the left base region. Lungs elsewhere are clear. Heart size and pulmonary vascularity are normal. Pacemaker leads are attached to the right atrium and right ventricle. There is aortic atherosclerosis. No adenopathy. Patient is status post total shoulder replacement on the right. IMPRESSION: Scarring left base. No edema or consolidation. Heart size normal. Pacemaker leads attached to right atrium and right ventricle. Aortic atherosclerosis present. No adenopathy evident. Aortic Atherosclerosis (ICD10-I70.0). Electronically Signed   By: Lowella Grip III M.D.   On: 04/27/2017 15:22    Procedures Procedures (including critical care time)  Medications Ordered in ED Medications  insulin aspart (novoLOG) injection 10 Units (not administered)  0.9 %  sodium chloride infusion (not administered)     Initial Impression / Assessment and Plan / ED Course  I have reviewed the triage vital signs and the nursing notes.  Pertinent labs & imaging results that were available during my care of the patient were reviewed by me and considered in my medical decision making (see chart for details).    Consult: Cardiology has evaluated patient.  At this time requests admission to hospitalist service.  We will continue to manage diagnostic workup and treatment of cardiac etiologies.  Consult: [facility] hospitalist for admission.  Final Clinical Impressions(s) / ED Diagnoses   Final diagnoses:  Paroxysmal atrial fibrillation (HCC)  Shortness of breath  General weakness    Severe comorbid illness   Patient is alert and appropriate.  Mental status is clear.  No respiratory distress at rest.  Lungs are clear.  No lower extremity edema.  Clinically, patient does not show signs of significant  volume overload.  Patient is hyperglycemic but not with acidosis, mental status change or other significant metabolic derangement.  Patient to be admitted as outlined for further diagnostic and therapeutic management. ED Discharge Orders    None       Charlesetta Shanks, MD 04/28/17 [phone]

## 2017-04-28 NOTE — Consult Note (Signed)
See below for Dr. Macky Lower office note which serves as this patient's consult note. 27F with remote stenting (date/vessel unknown), PAF with reported ablation previously, SSS s/p PPM, COPD, DM, HLD, CKD III, hypothyroidism (thyroid removal). Previously on dronedarone but stopped due to diarrhea. She had been started on amiodarone 03/2017 for recurrent AF. However, when seen in the office by Dr. Caryl Comes yesterday, she was still having more frequent atrial fib therefore amiodarone was discontinued as it was ineffective at maintaining NSR. She was also noting profound SOB for the last few weeks to the point where even 10 feet she would get SOB along with generalized weakness. No specific chest pain. She also reported recent uncontrolled blood sugars and had been trying to manage this with intermittent fasting. CXR yesterday showed scarring of the left base but otherwise clear lungs. OP troponin was negative. Of note, BNP in the ED on 2/12 was normal. Dr. Caryl Comes did not feel she was grossly volume overloaded and raised question of whether or not she would need ischemic evaluation given abnormal EKG. She already had a follow-up visit visit pending for today with Dr. Curt Bears to follow up symptoms further and discuss ablation. At visit today with Dr. Curt Bears, she continued to note significant exertional dyspnea. EKG was not obtained. She was sent to the ED for further evaluation as Dr. Curt Bears was suspicious there may be more going on than just atrial fib. EKG shortly after 11am showed NSR with TWI inferiorly, V3-V6. Telemetry shows NSR. The patient felt that she converted about an hour ago but I do not see that she was in AF in the ED. Workup shows marked hyperglycemia of 418, AKI on CKD with BUN/Cr 23/2.02, pseudohyponatremia, troponin 0.03, normal Hgb. Of note she recently had discordant thyroid function tests recently - TSH is historically unreliable per patient report, but free T4 was high and free T3 was  low.  Reviewed the above in depth with Dr. Caryl Comes on call for EP. He feels she may need cath this admission but this would be contingent on renal function trajectory and management of concomitant internal medicine issues which may be contributing to presentation.  Per our discussion, recommend the following: - internal medicine admission to address profound hyperglycemia, abnormal thyroid function tests and assist with AKI with CKD - as she is in NSR, cardioversion is not needed -> continue telemetry - hold any further torsemide for now pending improvement in renal function, with close attention to volume status - patient took this already this AM so tomorrow will be her first day on holiday from it. Per d/w MD, hold off any additional IV fluids given patient's reported history of going back into volume overload easily. - troponins ordered - d-dimer ordered -> if abnormal, recommend VQ scan (CT angio not ideal given renal dysfunction)  - does not need repeat CXR as this was just done yesterday - updated echo ordered - we will consider ischemic assessment this admission with [facility], however, this is contingent on Cr. For now, Dr. Caryl Comes recommends to please continue Eliquis with home medicine admission orders until it is known when this cath will occur (he is hopeful for Monday if renal function improves)  Melina Copa PA-C         ---    Electrophysiology Office Note   Date:  04/28/2017   ID:  Sammuel Bailiff, DOB 05-02-1940, MRN [id number]  PCP:  Glean Hess, MD  Cardiologist:  Rockey Situ Primary Electrophysiologist:  Meridee Score, MD             Chief Complaint  Patient presents with  . Pacemaker Check    PAF/Sick sinus syndrome     History of Present Illness: Dawn Garcia is a 77 y.o. female who presents today for electrophysiology evaluation.   PMHx of CAD (remote stent, patent by cath 2014), PAFib reportedly with an ablation in 2013,  COPD, DM, HLD, hypothyroidism, reported history of bradycardia. Was noted to have 5 second pauses on a monitor and a dual chamber pacemaker was placed 11/09/15. Had RA lead revision due to dislodgement.   Today, denies symptoms of orthopnea, PND, lower extremity edema, claudication, presyncope, syncope, bleeding, or neurologic sequela. The patient is tolerating medications without difficulties.  He has been having significant symptoms of fatigue, weakness, and shortness of breath.  She recently saw Dr. Caryl Comes yesterday who did a chest x-ray, BNP, and troponin which were all negative.  She continues to have significant symptoms of shortness of breath, unable to do many of her daily activities.  She cannot get to the bathroom or bathe herself at this time.       Past Medical History:  Diagnosis Date  . Atrial fibrillation ([place])   . CAD (coronary artery disease)   . Cancer ([place])    skin  . CHF (congestive heart failure) (HCC)     A-Fib  . COPD (chronic obstructive pulmonary disease) ([place])   . Diabetes mellitus without complication ([place])   . GERD (gastroesophageal reflux disease)   . Hyperlipidemia   . Hypocalcemia   . Hypokalemia   . Macular degeneration   . Myocardial infarction ([place])   . 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 Sinus node dysfunction (HCC)   . Thyroid disease   . Vitamin B12 deficiency   . Vitamin D deficiency         Past Surgical History:  Procedure Laterality Date  . CHOLECYSTECTOMY    . COLONOSCOPY  2015   1 polyp removed- repeat 3 years  . CORONARY ANGIOPLASTY WITH STENT PLACEMENT    . EP IMPLANTABLE DEVICE N/A 11/10/2015   Procedure: Pacemaker Implant;  Surgeon: Will [name], MD;  Location: [facility];  Service: Cardiovascular;  Laterality: N/A;  . EP IMPLANTABLE DEVICE N/A 11/11/2015   Procedure: Lead Revision/Repair;  Surgeon: Evans Lance, MD;  Location: [facility];  Service: Cardiovascular;   Laterality: N/A;  . parathyroid surgery     x 2  . THYROID SURGERY    . TOTAL SHOULDER ARTHROPLASTY Right    x 2  . VAGINAL HYSTERECTOMY             Current Outpatient Medications  Medication Sig Dispense Refill  . albuterol (PROVENTIL HFA;VENTOLIN HFA) 108 (90 Base) MCG/ACT inhaler Inhale 2 puffs into the lungs every 6 (six) hours as needed for wheezing or shortness of breath.    Marland Kitchen apixaban (ELIQUIS) 5 MG TABS tablet Take 1 tablet (5 mg total) 2 (two) times daily by mouth. 180 tablet 3  . b complex vitamins tablet Take 1 tablet by mouth daily.    . Biotin 1000 MCG CHEW Chew 1,000 mcg by mouth daily.    . Cholecalciferol (VITAMIN D3) 5000 units TABS Take 1 tablet by mouth every morning.    . empagliflozin (JARDIANCE) 10 MG TABS tablet Take 10 mg by mouth daily. 90 tablet 3  . levothyroxine (SYNTHROID, LEVOTHROID)  150 MCG tablet Take 1 tablet (150 mcg total) by mouth daily before breakfast.    . metFORMIN (GLUCOPHAGE) 500 MG tablet Take 500 mg by mouth 2 (two) times daily with a meal.     . nitroGLYCERIN (NITROSTAT) 0.4 MG SL tablet Place 0.4 mg under the tongue every 5 (five) minutes as needed for chest pain.    . pantoprazole (PROTONIX) 40 MG tablet Take 1 tablet (40 mg total) by mouth daily. 90 tablet 3  . potassium chloride (KLOR-CON) 8 MEQ tablet TAKE ONE TABLET BY MOUTH TWICE DAILY 180 tablet 2  . rosuvastatin (CRESTOR) 40 MG tablet Take 1 tablet (40 mg total) by mouth daily. 90 tablet 2  . torsemide (DEMADEX) 20 MG tablet Take 60 mg by mouth daily.    . vitamin B-12 (CYANOCOBALAMIN) 500 MCG tablet Take 500 mcg by mouth daily.     No current facility-administered medications for this visit.     Allergies:   Demerol [meperidine]; Multaq [dronedarone]; Sulfa antibiotics; Tetracyclines & related; and Metformin and related   Social History:  The patient  reports that  has never smoked. she has never used smokeless tobacco. She reports that she  drinks alcohol. She reports that she does not use drugs.   Family History:  The patient's family history includes Heart defect (age of onset: 30) in her father; Thyroid disease (age of onset: 8) in her mother.    ROS:  Please see the history of present illness.   Otherwise, review of systems is positive for appetite change, fatigue, chest pain, shortness of breath, abdominal swelling, constipation, diarrhea, back pain, dizziness.   All other systems are reviewed and negative.   PHYSICAL EXAM: VS:  BP 128/80   Pulse 63   Ht 5\' 3"  (1.6 m)   Wt 198 lb 9.6 oz (90.1 kg)   SpO2 95%   BMI 35.18 kg/m  , BMI Body mass index is 35.18 kg/m. GEN: Well nourished, well developed, in no acute distress  HEENT: normal  Neck: no JVD, carotid bruits, or masses Cardiac: RRR; no murmurs, rubs, or gallops,no edema  Respiratory:  clear to auscultation bilaterally, normal work of breathing GI: soft, nontender, nondistended, + BS MS: no deformity or atrophy  Skin: warm and dry, device site well healed Neuro:  Strength and sensation are intact Psych: euthymic mood, full affect  EKG:  EKG is not ordered today. Personal review of the ekg ordered 04/28/17 shows atrial fibrillation, intermittent V pacing  Personal review of the device interrogation yesterday. Results in [place]: 03/25/2017: Hemoglobin 16.4; Platelets 210 04/18/2017: ALT 27; Magnesium 2.4 04/25/2017: B Natriuretic Peptide 57.0 04/26/2017: BUN 19; Creatinine, Ser 1.69; Potassium 4.3; Sodium 141; TSH 0.198    Lipid Panel  Labs(Brief)          Component Value Date/Time   CHOL 127 04/19/2016 0857   TRIG 209 (H) 04/19/2016 0857   HDL 42 04/19/2016 0857   CHOLHDL 3.0 04/19/2016 0857   LDLCALC 43 04/19/2016 0857          Wt Readings from Last 3 Encounters:  04/28/17 198 lb 9.6 oz (90.1 kg)  04/27/17 199 lb 12 oz (90.6 kg)  04/26/17 202 lb (91.6 kg)      Other studies Reviewed: Additional studies/  records that were reviewed today include: TTE 09/02/15  Review of the above records today demonstrates:  - Left ventricle: The cavity size was normal. Posterior wall thickness was increased.. Systolic function was normal. The  estimated ejection fraction was in the range of 60% to 65%. Wall motion was normal; there were no regional wall motion abnormalities. Doppler parameters are consistent with abnormal left ventricular relaxation (grade 1 diastolic dysfunction). - Aortic valve: There was trivial regurgitation. - Left atrium: The atrium was mildly dilated.  01/22/16 Myoview  Nuclear stress EF: 54%.  Defect 1: There is a small defect of moderate severity present in the apical anterior and apex location.  This is a low risk study.  Findings consistent with prior myocardial infarction.  The left ventricular ejection fraction is mildly decreased (45-54%).  CXR - personally reviewed Scarring left base. No edema or consolidation. Heart size normal. Pacemaker leads attached to right atrium and right ventricle. Aortic atherosclerosis present. No adenopathy evident.  ASSESSMENT AND PLAN:  1.  Sick sinus syndrome: Post Saint Jude dual-chamber pacemaker.  Device functioning appropriately.  No changes.  2. SOB: She does not appear to be volume overloaded on exam.  BNP is also negative.  Device interrogation does show that she is in atrial fibrillation.  This could be the cause of her shortness of breath.  Due to this, we will plan to send her to the emergency room for possible admission to the hospital.  She may require cardioversion in the emergency room if she is still in atrial fibrillation.  3. Sleep apnea: Compliant with CPAP  4. Paroxysmal atrial fibrillation: Device interrogation yesterday shows atrial fibrillation.  We will plan for admission to the hospital as she is quite short of breath.  Did discuss with her the possibility of ablation.  She is interested in  ablation and will plan for this once her workup for shortness of breath has been completed.  She did have a prior ablation in [place].  We will try and get the results from that.  This patients CHA2DS2-VASc Score and unadjusted Ischemic Stroke Rate (% per year) is equal to 4.8 % stroke rate/year from a score of 4  Above score calculated as 1 point each if present [CHF, HTN, DM, Vascular=MI/PAD/Aortic Plaque, Age if 65-74, or Female] Above score calculated as 2 points each if present [Age > 75, or Stroke/TIA/TE]  5.  Coronary artery disease: Stented in 2014.  Is currently having on and off chest pain associated with her shortness of breath.  It is unclear whether this is due to atrial fibrillation.  She may require cardioversion or right and left heart catheterization.  Patient to be made on arrival to the hospital.  Current medicines are reviewed at length with the patient today.   The patient does not have concerns regarding her medicines.  The following changes were made today:  none  Labs/ tests ordered today include:  No orders of the defined types were placed in this encounter.    Disposition:   FU with Will Camnitz 3 months  Signed, Will [name], MD  04/28/2017 10:36 AM     [facility] [address] [place] [address] [phone] (office) [phone] (fax)

## 2017-04-28 NOTE — ED Notes (Signed)
Attempted iv stick x 1 unsuccessfully

## 2017-04-28 NOTE — H&P (Deleted)
Wrong note type chosen. Dayna Dunn PA-C

## 2017-04-29 ENCOUNTER — Encounter (HOSPITAL_COMMUNITY): Payer: Self-pay | Admitting: General Practice

## 2017-04-29 ENCOUNTER — Other Ambulatory Visit: Payer: Self-pay

## 2017-04-29 ENCOUNTER — Observation Stay (HOSPITAL_BASED_OUTPATIENT_CLINIC_OR_DEPARTMENT_OTHER): Payer: Medicare Other

## 2017-04-29 ENCOUNTER — Observation Stay (HOSPITAL_COMMUNITY): Payer: Medicare Other

## 2017-04-29 DIAGNOSIS — E785 Hyperlipidemia, unspecified: Secondary | ICD-10-CM | POA: Diagnosis present

## 2017-04-29 DIAGNOSIS — I5033 Acute on chronic diastolic (congestive) heart failure: Secondary | ICD-10-CM

## 2017-04-29 DIAGNOSIS — R945 Abnormal results of liver function studies: Secondary | ICD-10-CM | POA: Diagnosis not present

## 2017-04-29 DIAGNOSIS — J432 Centrilobular emphysema: Secondary | ICD-10-CM | POA: Diagnosis not present

## 2017-04-29 DIAGNOSIS — Z96611 Presence of right artificial shoulder joint: Secondary | ICD-10-CM | POA: Diagnosis present

## 2017-04-29 DIAGNOSIS — E669 Obesity, unspecified: Secondary | ICD-10-CM | POA: Diagnosis not present

## 2017-04-29 DIAGNOSIS — N183 Chronic kidney disease, stage 3 (moderate): Secondary | ICD-10-CM | POA: Diagnosis not present

## 2017-04-29 DIAGNOSIS — I495 Sick sinus syndrome: Secondary | ICD-10-CM | POA: Diagnosis not present

## 2017-04-29 DIAGNOSIS — I25118 Atherosclerotic heart disease of native coronary artery with other forms of angina pectoris: Secondary | ICD-10-CM | POA: Diagnosis not present

## 2017-04-29 DIAGNOSIS — I5032 Chronic diastolic (congestive) heart failure: Secondary | ICD-10-CM | POA: Diagnosis not present

## 2017-04-29 DIAGNOSIS — I5031 Acute diastolic (congestive) heart failure: Secondary | ICD-10-CM | POA: Diagnosis not present

## 2017-04-29 DIAGNOSIS — Z7984 Long term (current) use of oral hypoglycemic drugs: Secondary | ICD-10-CM | POA: Diagnosis not present

## 2017-04-29 DIAGNOSIS — Z79899 Other long term (current) drug therapy: Secondary | ICD-10-CM | POA: Diagnosis not present

## 2017-04-29 DIAGNOSIS — E1122 Type 2 diabetes mellitus with diabetic chronic kidney disease: Secondary | ICD-10-CM | POA: Diagnosis present

## 2017-04-29 DIAGNOSIS — Z950 Presence of cardiac pacemaker: Secondary | ICD-10-CM | POA: Diagnosis not present

## 2017-04-29 DIAGNOSIS — J449 Chronic obstructive pulmonary disease, unspecified: Secondary | ICD-10-CM | POA: Diagnosis present

## 2017-04-29 DIAGNOSIS — E890 Postprocedural hypothyroidism: Secondary | ICD-10-CM | POA: Diagnosis not present

## 2017-04-29 DIAGNOSIS — K219 Gastro-esophageal reflux disease without esophagitis: Secondary | ICD-10-CM | POA: Diagnosis not present

## 2017-04-29 DIAGNOSIS — R079 Chest pain, unspecified: Secondary | ICD-10-CM | POA: Diagnosis not present

## 2017-04-29 DIAGNOSIS — I2510 Atherosclerotic heart disease of native coronary artery without angina pectoris: Secondary | ICD-10-CM | POA: Diagnosis not present

## 2017-04-29 DIAGNOSIS — N179 Acute kidney failure, unspecified: Secondary | ICD-10-CM | POA: Diagnosis present

## 2017-04-29 DIAGNOSIS — I252 Old myocardial infarction: Secondary | ICD-10-CM | POA: Diagnosis not present

## 2017-04-29 DIAGNOSIS — E782 Mixed hyperlipidemia: Secondary | ICD-10-CM | POA: Diagnosis not present

## 2017-04-29 DIAGNOSIS — G4733 Obstructive sleep apnea (adult) (pediatric): Secondary | ICD-10-CM | POA: Diagnosis not present

## 2017-04-29 DIAGNOSIS — Z955 Presence of coronary angioplasty implant and graft: Secondary | ICD-10-CM | POA: Diagnosis not present

## 2017-04-29 DIAGNOSIS — E1165 Type 2 diabetes mellitus with hyperglycemia: Secondary | ICD-10-CM | POA: Diagnosis present

## 2017-04-29 DIAGNOSIS — Z7901 Long term (current) use of anticoagulants: Secondary | ICD-10-CM | POA: Diagnosis not present

## 2017-04-29 DIAGNOSIS — N289 Disorder of kidney and ureter, unspecified: Secondary | ICD-10-CM | POA: Diagnosis not present

## 2017-04-29 DIAGNOSIS — I4891 Unspecified atrial fibrillation: Secondary | ICD-10-CM | POA: Diagnosis not present

## 2017-04-29 DIAGNOSIS — J439 Emphysema, unspecified: Secondary | ICD-10-CM | POA: Diagnosis not present

## 2017-04-29 DIAGNOSIS — Z79890 Hormone replacement therapy (postmenopausal): Secondary | ICD-10-CM | POA: Diagnosis not present

## 2017-04-29 DIAGNOSIS — I480 Paroxysmal atrial fibrillation: Secondary | ICD-10-CM | POA: Diagnosis not present

## 2017-04-29 DIAGNOSIS — R0600 Dyspnea, unspecified: Secondary | ICD-10-CM | POA: Diagnosis not present

## 2017-04-29 DIAGNOSIS — R531 Weakness: Secondary | ICD-10-CM | POA: Diagnosis not present

## 2017-04-29 DIAGNOSIS — Z90710 Acquired absence of both cervix and uterus: Secondary | ICD-10-CM | POA: Diagnosis not present

## 2017-04-29 DIAGNOSIS — K760 Fatty (change of) liver, not elsewhere classified: Secondary | ICD-10-CM | POA: Diagnosis not present

## 2017-04-29 DIAGNOSIS — I130 Hypertensive heart and chronic kidney disease with heart failure and stage 1 through stage 4 chronic kidney disease, or unspecified chronic kidney disease: Secondary | ICD-10-CM | POA: Diagnosis present

## 2017-04-29 DIAGNOSIS — H3530 Unspecified macular degeneration: Secondary | ICD-10-CM | POA: Diagnosis present

## 2017-04-29 DIAGNOSIS — R0602 Shortness of breath: Secondary | ICD-10-CM | POA: Diagnosis not present

## 2017-04-29 LAB — COMPREHENSIVE METABOLIC PANEL
ALT: 91 U/L — ABNORMAL HIGH (ref 14–54)
ANION GAP: 17 — AB (ref 5–15)
AST: 46 U/L — ABNORMAL HIGH (ref 15–41)
Albumin: 3.2 g/dL — ABNORMAL LOW (ref 3.5–5.0)
Alkaline Phosphatase: 361 U/L — ABNORMAL HIGH (ref 38–126)
BUN: 25 mg/dL — ABNORMAL HIGH (ref 6–20)
CHLORIDE: 95 mmol/L — AB (ref 101–111)
CO2: 23 mmol/L (ref 22–32)
Calcium: 8.9 mg/dL (ref 8.9–10.3)
Creatinine, Ser: 2.13 mg/dL — ABNORMAL HIGH (ref 0.44–1.00)
GFR calc non Af Amer: 21 mL/min — ABNORMAL LOW (ref >60)
GFR, EST AFRICAN AMERICAN: 25 mL/min — AB (ref >60)
Glucose, Bld: 197 mg/dL — ABNORMAL HIGH (ref 65–99)
POTASSIUM: 3 mmol/L — AB (ref 3.5–5.1)
SODIUM: 135 mmol/L (ref 135–145)
Total Bilirubin: 1.1 mg/dL (ref 0.3–1.2)
Total Protein: 6.4 g/dL — ABNORMAL LOW (ref 6.5–8.1)

## 2017-04-29 LAB — RESPIRATORY PANEL BY PCR
ADENOVIRUS-RVPPCR: NOT DETECTED
Bordetella pertussis: NOT DETECTED
CORONAVIRUS NL63-RVPPCR: NOT DETECTED
CORONAVIRUS OC43-RVPPCR: NOT DETECTED
Chlamydophila pneumoniae: NOT DETECTED
Coronavirus 229E: NOT DETECTED
Coronavirus HKU1: NOT DETECTED
INFLUENZA A-RVPPCR: NOT DETECTED
Influenza B: NOT DETECTED
MYCOPLASMA PNEUMONIAE-RVPPCR: NOT DETECTED
Metapneumovirus: NOT DETECTED
PARAINFLUENZA VIRUS 1-RVPPCR: NOT DETECTED
PARAINFLUENZA VIRUS 3-RVPPCR: NOT DETECTED
PARAINFLUENZA VIRUS 4-RVPPCR: NOT DETECTED
Parainfluenza Virus 2: NOT DETECTED
Respiratory Syncytial Virus: NOT DETECTED
Rhinovirus / Enterovirus: NOT DETECTED

## 2017-04-29 LAB — CBC WITH DIFFERENTIAL/PLATELET
Basophils Absolute: 0 10*3/uL (ref 0.0–0.1)
Basophils Relative: 0 %
EOS ABS: 0.3 10*3/uL (ref 0.0–0.7)
EOS PCT: 3 %
HCT: 42.4 % (ref 36.0–46.0)
HEMOGLOBIN: 13.9 g/dL (ref 12.0–15.0)
LYMPHS ABS: 2.1 10*3/uL (ref 0.7–4.0)
Lymphocytes Relative: 26 %
MCH: 28.1 pg (ref 26.0–34.0)
MCHC: 32.8 g/dL (ref 30.0–36.0)
MCV: 85.7 fL (ref 78.0–100.0)
MONO ABS: 0.6 10*3/uL (ref 0.1–1.0)
MONOS PCT: 7 %
NEUTROS PCT: 64 %
Neutro Abs: 5.2 10*3/uL (ref 1.7–7.7)
PLATELETS: 192 10*3/uL (ref 150–400)
RBC: 4.95 MIL/uL (ref 3.87–5.11)
RDW: 15.1 % (ref 11.5–15.5)
WBC: 8.1 10*3/uL (ref 4.0–10.5)

## 2017-04-29 LAB — GLUCOSE, CAPILLARY
GLUCOSE-CAPILLARY: 236 mg/dL — AB (ref 65–99)
Glucose-Capillary: 184 mg/dL — ABNORMAL HIGH (ref 65–99)
Glucose-Capillary: 307 mg/dL — ABNORMAL HIGH (ref 65–99)
Glucose-Capillary: 236 mg/dL — ABNORMAL HIGH (ref 65–99)

## 2017-04-29 LAB — TROPONIN I
TROPONIN I: 0.03 ng/mL — AB (ref <0.03)
Troponin I: 0.03 ng/mL (ref <0.03)
Troponin I: <0.03 ng/mL (ref <0.03)

## 2017-04-29 LAB — ECHOCARDIOGRAM COMPLETE
HEIGHTINCHES: 63 in
WEIGHTICAEL: 3156.8 [oz_av]

## 2017-04-29 LAB — MAGNESIUM: Magnesium: 2.2 mg/dL (ref 1.7–2.4)

## 2017-04-29 LAB — PHOSPHORUS: PHOSPHORUS: 4.2 mg/dL (ref 2.5–4.6)

## 2017-04-29 MED ORDER — POTASSIUM CHLORIDE CRYS ER 20 MEQ PO TBCR
40.0000 meq | EXTENDED_RELEASE_TABLET | Freq: Two times a day (BID) | ORAL | Status: AC
  Administered 2017-04-29 (×2): 40 meq via ORAL
  Filled 2017-04-29 (×2): qty 2

## 2017-04-29 MED ORDER — INSULIN GLARGINE 100 UNIT/ML ~~LOC~~ SOLN
15.0000 [IU] | Freq: Every day | SUBCUTANEOUS | Status: DC
Start: 2017-04-29 — End: 2017-04-30
  Administered 2017-04-29: 15 [IU] via SUBCUTANEOUS
  Filled 2017-04-29 (×2): qty 0.15

## 2017-04-29 MED ORDER — POTASSIUM CHLORIDE IN NACL 20-0.9 MEQ/L-% IV SOLN
INTRAVENOUS | Status: DC
  Administered 2017-04-29 (×2): via INTRAVENOUS
  Filled 2017-04-29: qty 1000

## 2017-04-29 MED ORDER — POTASSIUM CHLORIDE IN NACL 20-0.9 MEQ/L-% IV SOLN
INTRAVENOUS | Status: AC
  Filled 2017-04-29: qty 1000

## 2017-04-29 NOTE — Progress Notes (Signed)
  Echocardiogram 2D Echocardiogram has been performed.  Darlina Sicilian M 04/29/2017, 9:56 AM

## 2017-04-29 NOTE — Progress Notes (Signed)
PROGRESS NOTE    Dawn Garcia  XVQ:[id number] DOB: 09/21/1940 DOA: 04/28/2017 PCP: Glean Hess, MD   Brief Narrative:  Dawn Garcia is a 77 y.o. 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 States she feels like this when she goes into Atrial Fibrillation and has had an ablation in the past. Was recently evaluated by EP Cardiology by Dr. Caryl Comes yesterday and Dr. Curt Bears today and was sent to the ED for evaluation given concern that SOB maybe something else besides PAF. She endorses no leg swelling and states she was recently taken off of Amiodarone 2 days ago. Has been having intermittent Chest Pressure that is left side associated with this SOB. Admits to being Lightheaded and sometimes dizzy with these episodes and also has tremendous fatigue and weakness. No Nausea or vomting. [facility] was called to evaluate and admit patient for Acute Dyspnea/Dyspnea on Exertion and CP r/o ACS. Patient's Troponin is stable and ECHO has not changed. Patient's SOB is improved and no evidence for CHF or coronary ischemia per Cardiology Dr. Debara Pickett. Awaiting EP's input about ? Cath. Had some Abnormal LFT's so obtained RUQ and awaiting results of Acute Hepatitis Panel.   Assessment & Plan:   Active Problems:   Paroxysmal atrial fibrillation (HCC)   Obesity, Class II, BMI 35-39.9   GERD with stricture   Diastolic CHF (HCC)   CKD (chronic kidney disease) stage 3, GFR 30-59 ml/min (HCC)   COPD (chronic obstructive pulmonary disease) (HCC)   Hypothyroidism following radioiodine therapy   Coronary artery disease   Hyperlipidemia   Chest pain   SSS (sick sinus syndrome) ([place])   Uncontrolled type 2 diabetes mellitus with hyperglycemia, without long-term current use of insulin (HCC)   OSA (obstructive  sleep apnea)  Acute Dyspnea/Dyspnea on Exertion, improved -Progressively worsening; ? Related to PAF vs. COPD (unlikley as patient wasn't wheezing) -Placed in Obs Telemetry -Checked Patient for Respiratory Virus Panel and Flu and was Negative.  -Cardiology Checkied D-Dimer and was <0.27; Will not get V/Q Scan -Xopenex PRN and added Mucinex -Checked ECHOCardiogram as below -CXR done yesterday showed Scarring left base. No edema or consolidation. Heart size normal. Pacemaker leads attached to right atrium and right ventricle. Aortic atherosclerosis present. No adenopathy evident -Repeat CXR in AM  -PT/OT Recommending Home Health PT  Chest Pain r/o ACS in a patient with CAD and Hx of MI -Has been having intermittent CP, but improved. ? Related to A Fib.  -C/w NTG 0.4 mg SL q23minPRN -Cycle Troponin x3 and were Flat at 0.03 x3 -Repeat EKG Sinus -C/w Telemetry -Obtained Echocardiogram and showed EF of 50-55% with Grade 1 DD -Cardiology Evaluating and considering Ischemic Assessment with Cardiac Catheterization but are unsure at this point as it is contingent on Cr; Dr. Debara Pickett at this point feels she has no evidence of coronary ischemia. EP to evaluate and deferring whether she needs Cardiac Catheterization.   Paroxysmal Atrial Fibrillation -Was found to be in Atrial Fibrillation when Device was interrogated as an outpatient and spontaneously converted to NSR today in the ED -C/w Anticoagulation with Eliquis -C/w Telemetry -Recently stopped Amiodarone given ineffectiveness of keeping NSR -Cardiology Consulted and appreciate further recommendations; EP to evaluate -Medical Cardiology feels she is in Sinus   OSA -C/w Home CPAP  AKI on CKD Stage 3, worsened  -Patient Took  her Home Torsemide this AM and likley is Dehydrated based on Labs -Avoid Nephrotoxic Medications if Possible -BUN/Cr has worsened and went from 21/1.60 -> 23/2.02 -> 25/2.13 -Gentle IVF Rehydration with NS at 50 mL/hr  x 15 hours -Continue to Monitor and Repeat CMP in AM   Uncontrolled Hyperglycemia in the Setting of Diabetes Mellitus Type 2 -Hold Jardiance and Metformin and encourage Discontinuation at D/C -Checked HbA1c and was 9.4; Last one in September 18 was 7.8 -Started Lantus 10 units qHS and will increase to 15 units and placed on Moderate Novolog SSI AC -Appreciate Diabetic Education Coordinator Recommendations -Continue to Monitor CBG's; CBG's ranging from 184-307  Hypothyroidism -S/p Radioactive Thyroid Ablation  -Patient recently had Thyroid studies 2 days ago which showed patient had a TSH of 0.198 (low), T3 of 1.8 (low), and Free T4 of 3.54 (high) -Upon review of patient's Medication list, patient unfortunately has been taking Biotin 1000 mcg po Daily which can alter Thyroid Assay results and can falsely elevate T4 and in some cases make TSH look lower in lab settings  -Patient has also been taking Amiodarone (recently stopped 2 days ago)  which decreases Free T3 Conversion from Free T4 -Patient's Thyroid Studies therefore are inaccurate and patient needs to be off of Biotin at least 3 days prior to Rechecking TSH, Free T3, and Free T4 -Will not check again this hospitalization and will continue her Levothyroxine 150 mcg po Daily  -Case was discussed with Dr. Buddy Duty of Endocrinology and Patient to follow up with Dr. Buddy Duty at D/C and he will check her labs and adjust medications as necessary  Chronic Diastolic CHF -Currently not Decompensated -Actually looks on the drier side -Strict I's/O's, Daily Weights -Hold Torsemide for now given AKI on CKD; Started Gentle IVF Rehydration with NS at 50 mL/hr given worsening Kidney Function  -Patient is -260 mL  Dyslipidemia/HLD -C/w Rosuvastatin 40 mg po Daily   SSS s/p PPM -C/w Telemetry -Cardiology Following and appreciate further recommendations   COPD -Currently do not feel she is in Exacerbation -C/w prn Xopenex  Nebs  Hypokalemia -Patient's K+ was 3.0 -Replete with 40 mEQ po BID x2 and IV NS + 20 mEQ of KCl at 50 mL/hr -Continue to Monitor and Replete as Necessary -Repeat CMP in AM  Abnormal LFT's -? Related to Hypoperfusion in the Setting of A Fib. -AST was 46 and ALT was 91 -Obtained RUQ U/S and showed previous cholecystectomy and no acute findings. There was mild hepatic steatosis without focal mass -Check Acute Hepatitis Panel    DVT prophylaxis: Anticoagulated with Apixaban Code Status: FULL CODE Family Communication: Discussed with Husband and Daughter at bedside Disposition Plan: D/C Home with [place] when Cleared by Cardiology  Consultants:  Cardiology   Procedures:  ECHOCARDIOGRAM ------------------------------------------------------------------- Study Conclusions  - Procedure narrative: Transthoracic echocardiography. Technically   difficult study with reduced echo windows. - Left ventricle: The cavity size was normal. Wall thickness was   increased in a pattern of mild LVH. Systolic function was normal.   The estimated ejection fraction was in the range of 50% to 55%.   Doppler parameters are consistent with abnormal left ventricular   relaxation (grade 1 diastolic dysfunction). The E/e&' ratio is   between 8-15, suggesting indeterminate LV filling pressure. - Aortic valve: Trileaflet. Sclerosis without stenosis. There was   trivial regurgitation. - Mitral valve: Mildly thickened leaflets . There was trivial   regurgitation. - Left atrium: The atrium was normal in size. - Inferior vena  cava: The vessel was normal in size. The   respirophasic diameter changes were in the normal range (>= 50%),   consistent with normal central venous pressure.  Impressions:  - Compared to a prior study in 10/2016, there has been no   significant change.   Antimicrobials:  Anti-infectives (From admission, onward)   None     Subjective: Seen and examined and felt her  dyspnea was much improved and almost baseline. Still felt weak but appreciated working with PT and interested in going home with home health. No CP today. No lightheadedness or dizziness and felt overall tremendously better.   Objective: Vitals:   04/28/17 2027 04/29/17 0037 04/29/17 0535 04/29/17 1206  BP:  112/70 127/67 (!) 124/45  Pulse:  80 66 63  Resp:  18 18 20   Temp:  98.8 F (37.1 C) 98.8 F (37.1 C) 98 F (36.7 C)  TempSrc:  Oral Oral Oral  SpO2:  94% 97% 93%  Weight: 89.2 kg (196 lb 9.6 oz)  89.5 kg (197 lb 4.8 oz)   Height: 5\' 3"  (1.6 m)       Intake/Output Summary (Last 24 hours) at 04/29/2017 1655 Last data filed at 04/29/2017 1431 Gross per 24 hour  Intake 840 ml  Output 1100 ml  Net -260 ml   Filed Weights   04/28/17 1110 04/28/17 2027 04/29/17 0535  Weight: 89.8 kg (198 lb) 89.2 kg (196 lb 9.6 oz) 89.5 kg (197 lb 4.8 oz)   Examination: Physical Exam:  Constitutional: WN/WD obese Caucasian Female in NAD and appears calm and comfortable Eyes: Lids and conjunctivae normal, sclerae anicteric  ENMT: External Ears, Nose appear normal. Grossly normal hearing. Mucous membranes are moist.  Neck: Appears normal, supple, no cervical masses, normal ROM, no appreciable thyromegaly, no JVD Respiratory: Diminished to auscultation bilaterally, no wheezing, rales, rhonchi or crackles. Normal respiratory effort and patient is not tachypenic. No accessory muscle use.  Cardiovascular: RRR, no murmurs / rubs / gallops. S1 and S2 auscultated. No extremity edema Abdomen: Soft, mildly tender, Distended due to body habitus. Has a ventral hernia. No appreciable hepatosplenomegaly. Bowel sounds positive.  GU: Deferred. Musculoskeletal: No clubbing / cyanosis of digits/nails. No joint deformity upper and lower extremities.  Skin: No rashes, lesions, ulcers on a limited skin evaluation. No induration; Warm and dry.  Neurologic: CN 2-12 grossly intact with no focal deficits. Romberg sign  cerebellar reflexes not assessed.  Psychiatric: Normal judgment and insight. Alert and oriented x 3. Normal mood and appropriate affect.   Data Reviewed: I have personally reviewed following labs and imaging studies  CBC: Recent Labs  Lab 04/28/17 1107 04/29/17 0429  WBC 10.1 8.1  NEUTROABS  --  5.2  HGB 14.9 13.9  HCT 44.5 42.4  MCV 86.1 85.7  PLT 193 852   Basic Metabolic Panel: Recent Labs  Lab 04/26/17 1642 04/28/17 1107 04/29/17 0429  NA 141 134* 135  K 4.3 3.5 3.0*  CL 94* 93* 95*  CO2 23 23 23   GLUCOSE 310* 418* 197*  BUN 19 23* 25*  CREATININE 1.69* 2.02* 2.13*  CALCIUM 9.6 9.6 8.9  MG  --   --  2.2  PHOS  --   --  4.2   GFR: Estimated Creatinine Clearance: 23.8 mL/min (A) (by C-G formula based on SCr of 2.13 mg/dL (H)). Liver Function Tests: Recent Labs  Lab 04/29/17 0429  AST 46*  ALT 91*  ALKPHOS 361*  BILITOT 1.1  PROT 6.4*  ALBUMIN 3.2*  No results for input(s): LIPASE, AMYLASE in the last 168 hours. No results for input(s): AMMONIA in the last 168 hours. Coagulation Profile: No results for input(s): INR, PROTIME in the last 168 hours. Cardiac Enzymes: Recent Labs  Lab 04/28/17 1949 04/28/17 2228 04/29/17 0051 04/29/17 0429 04/29/17 0928  TROPONINI <0.03 0.03* 0.03* 0.03* <0.03   BNP (last 3 results) No results for input(s): PROBNP in the last 8760 hours. HbA1C: Recent Labs    04/28/17 1949  HGBA1C 9.4*   CBG: Recent Labs  Lab 04/28/17 1751 04/28/17 2109 04/29/17 0744 04/29/17 1146 04/29/17 1627  GLUCAP 251* 223* 184* 307* 236*   Lipid Profile: No results for input(s): CHOL, HDL, LDLCALC, TRIG, CHOLHDL, LDLDIRECT in the last 72 hours. Thyroid Function Tests: No results for input(s): TSH, T4TOTAL, FREET4, T3FREE, THYROIDAB in the last 72 hours. Anemia Panel: No results for input(s): VITAMINB12, FOLATE, FERRITIN, TIBC, IRON, RETICCTPCT in the last 72 hours. Sepsis Labs: No results for input(s): PROCALCITON, LATICACIDVEN  in the last 168 hours.  Recent Results (from the past 240 hour(s))  Respiratory Panel by PCR     Status: None   Collection Time: 04/28/17  6:25 PM  Result Value Ref Range Status   Adenovirus NOT DETECTED NOT DETECTED Final   Coronavirus 229E NOT DETECTED NOT DETECTED Final   Coronavirus HKU1 NOT DETECTED NOT DETECTED Final   Coronavirus NL63 NOT DETECTED NOT DETECTED Final   Coronavirus OC43 NOT DETECTED NOT DETECTED Final   Metapneumovirus NOT DETECTED NOT DETECTED Final   Rhinovirus / Enterovirus NOT DETECTED NOT DETECTED Final   Influenza A NOT DETECTED NOT DETECTED Final   Influenza B NOT DETECTED NOT DETECTED Final   Parainfluenza Virus 1 NOT DETECTED NOT DETECTED Final   Parainfluenza Virus 2 NOT DETECTED NOT DETECTED Final   Parainfluenza Virus 3 NOT DETECTED NOT DETECTED Final   Parainfluenza Virus 4 NOT DETECTED NOT DETECTED Final   Respiratory Syncytial Virus NOT DETECTED NOT DETECTED Final   Bordetella pertussis NOT DETECTED NOT DETECTED Final   Chlamydophila pneumoniae NOT DETECTED NOT DETECTED Final   Mycoplasma pneumoniae NOT DETECTED NOT DETECTED Final    Comment: Performed at [facility], [address] [place], [place] [address]    Radiology Studies: US Abdomen Limited Ruq  Result Date: 04/29/2017 CLINICAL DATA:  Abnormal LFTs.  Previous cholecystectomy. EXAM: ULTRASOUND ABDOMEN LIMITED RIGHT UPPER QUADRANT COMPARISON:  None. FINDINGS: Gallbladder: Previous cholecystectomy. Common bile duct: Diameter: 8.0 mm. Liver: Findings suggesting mild hepatic steatosis without focal mass. Portal vein is patent on color Doppler imaging with normal direction of blood flow towards the liver. IMPRESSION: Previous cholecystectomy.  No acute findings. Mild hepatic steatosis without focal mass. Electronically Signed   By: Marin Olp M.D.   On: 04/29/2017 13:55   Scheduled Meds: . apixaban  5 mg Oral BID  . B-complex with vitamin C  1 tablet Oral Daily  . cholecalciferol   5,000 Units Oral q morning - 10a  . docusate sodium  100 mg Oral Daily  . guaiFENesin  600 mg Oral BID  . insulin aspart  0-15 Units Subcutaneous TID WC  . insulin glargine  10 Units Subcutaneous QHS  . levothyroxine  150 mcg Oral QAC breakfast  . pantoprazole  40 mg Oral Daily  . potassium chloride  40 mEq Oral BID  . rosuvastatin  40 mg Oral Daily  . vitamin B-12  500 mcg Oral Daily   Continuous Infusions: . 0.9 % NaCl with KCl 20  mEq / L 50 mL/hr at 04/29/17 1145    LOS: 0 days   Kerney Elbe, DO [facility] Pager [phone]  If 7PM-7AM, please contact night-coverage [email or web address] Password [facility] 04/29/2017, 4:55 PM

## 2017-04-29 NOTE — Progress Notes (Signed)
DAILY PROGRESS NOTE   Patient Name: Dawn Garcia Date of Encounter: 04/29/2017  Chief Complaint   Dyspnea improved - c/o right scapular pain  Patient Profile   59F with remote stenting (date/vessel unknown), PAF with reported ablation previously, SSS s/p PPM, COPD, DM, HLD, CKD III, hypothyroidism (thyroid removal). Previously on dronedarone but stopped due to diarrhea. She had been started on amiodarone 03/2017 for recurrent AF. However, when seen in the office by Dr. Caryl Comes yesterday, she was still having more frequent atrial fib therefore amiodarone was discontinued as it was ineffective at maintaining NSR. She was also noting profound SOB for the last few weeks to the point where even 10 feet she would get SOB along with generalized weakness  Subjective   Troponin essentially negative (0.03 x 3) overnight - Creatinine elevated at 2.13. Hypokalemic today at 3.0. AST/ALT and ALK phos elevated. A1c 9.4. UA negative. Echo personally reviewed today - technically difficult study. LVEF unchanged at 50-55% - rhythm is sinus, with grade 1 DD, indeterminate LV filling pressure.  Objective   Vitals:   04/28/17 1921 04/28/17 2027 04/29/17 0037 04/29/17 0535  BP: 118/68  112/70 127/67  Pulse: 77  80 66  Resp: 18  18 18   Temp: 98.3 F (36.8 C)  98.8 F (37.1 C) 98.8 F (37.1 C)  TempSrc: Oral  Oral Oral  SpO2: 98%  94% 97%  Weight:  196 lb 9.6 oz (89.2 kg)  197 lb 4.8 oz (89.5 kg)  Height:  5' 3"  (1.6 m)      Intake/Output Summary (Last 24 hours) at 04/29/2017 0955 Last data filed at 04/29/2017 0631 Gross per 24 hour  Intake 600 ml  Output 1100 ml  Net -500 ml   Filed Weights   04/28/17 1110 04/28/17 2027 04/29/17 0535  Weight: 198 lb (89.8 kg) 196 lb 9.6 oz (89.2 kg) 197 lb 4.8 oz (89.5 kg)    Physical Exam   General appearance: alert, fatigued and no distress Neck: no carotid bruit, no JVD and thyroid not enlarged, symmetric, no tenderness/mass/nodules Lungs: diminished  breath sounds bibasilar Heart: regular rate and rhythm Abdomen: soft, non-tender; bowel sounds normal; no masses,  no organomegaly and obese Extremities: extremities normal, atraumatic, no cyanosis or edema Pulses: 2+ and symmetric Skin: Skin color, texture, turgor normal. No rashes or lesions Neurologic: Grossly normal Psych: Pleasant  Inpatient Medications    Scheduled Meds: . apixaban  5 mg Oral BID  . B-complex with vitamin C  1 tablet Oral Daily  . cholecalciferol  5,000 Units Oral q morning - 10a  . docusate sodium  100 mg Oral Daily  . guaiFENesin  600 mg Oral BID  . insulin aspart  0-15 Units Subcutaneous TID WC  . insulin glargine  10 Units Subcutaneous QHS  . levothyroxine  150 mcg Oral QAC breakfast  . pantoprazole  40 mg Oral Daily  . potassium chloride  40 mEq Oral BID  . rosuvastatin  40 mg Oral Daily  . vitamin B-12  500 mcg Oral Daily    Continuous Infusions: . 0.9 % NaCl with KCl 20 mEq / L      PRN Meds: acetaminophen **OR** acetaminophen, levalbuterol, nitroGLYCERIN, ondansetron **OR** ondansetron (ZOFRAN) IV, polyethylene glycol, traMADol   Labs   Results for orders placed or performed during the hospital encounter of 04/28/17 (from the past 48 hour(s))  Basic metabolic panel     Status: Abnormal   Collection Time: 04/28/17 11:07 AM  Result Value Ref Range  Sodium 134 (L) 135 - 145 mmol/L   Potassium 3.5 3.5 - 5.1 mmol/L   Chloride 93 (L) 101 - 111 mmol/L   CO2 23 22 - 32 mmol/L   Glucose, Bld 418 (H) 65 - 99 mg/dL   BUN 23 (H) 6 - 20 mg/dL   Creatinine, Ser 2.02 (H) 0.44 - 1.00 mg/dL   Calcium 9.6 8.9 - 10.3 mg/dL   GFR calc non Af Amer 23 (L) >60 mL/min   GFR calc Af Amer 26 (L) >60 mL/min    Comment: (NOTE) The eGFR has been calculated using the CKD EPI equation. This calculation has not been validated in all clinical situations. eGFR's persistently <60 mL/min signify possible Chronic Kidney Disease.    Anion gap 18 (H) 5 - 15     Comment: Performed at [facility], [place] [address], [place], [place] [address]  CBC     Status: Abnormal   Collection Time: 04/28/17 11:07 AM  Result Value Ref Range   WBC 10.1 4.0 - 10.5 K/uL   RBC 5.17 (H) 3.87 - 5.11 MIL/uL   Hemoglobin 14.9 12.0 - 15.0 g/dL   HCT 44.5 36.0 - 46.0 %   MCV 86.1 78.0 - 100.0 fL   MCH 28.8 26.0 - 34.0 pg   MCHC 33.5 30.0 - 36.0 g/dL   RDW 15.2 11.5 - 15.5 %   Platelets 193 150 - 400 K/uL    Comment: Performed at [facility], [place] [address], [place], [place] [address]  I-stat troponin, ED     Status: None   Collection Time: 04/28/17 11:28 AM  Result Value Ref Range   Troponin i, poc 0.00 0.00 - 0.08 ng/mL   Comment 3            Comment: Due to the release kinetics of cTnI, a negative result within the first hours of the onset of symptoms does not rule out myocardial infarction with certainty. If myocardial infarction is still suspected, repeat the test at appropriate intervals.   D-dimer, quantitative (not at [facility])     Status: None   Collection Time: 04/28/17  4:37 PM  Result Value Ref Range   D-Dimer, Quant <0.27 0.00 - 0.50 ug/mL-FEU    Comment: (NOTE) At the manufacturer cut-off of 0.50 ug/mL FEU, this assay has been documented to exclude PE with a sensitivity and negative predictive value of 97 to 99%.  At this time, this assay has not been approved by the FDA to exclude DVT/VTE. Results should be correlated with clinical presentation. Performed at [facility], [place] [address] [place], [place] [address]   Troponin I     Status: None   Collection Time: 04/28/17  4:37 PM  Result Value Ref Range   Troponin I <0.03 <0.03 ng/mL    Comment: Performed at [facility] [address], [place], [place] [address]  CBG monitoring, ED     Status: Abnormal   Collection Time: 04/28/17  5:51 PM  Result Value Ref Range   Glucose-Capillary 251 (H) 65 - 99 mg/dL   Comment 1 Notify RN    Comment 2 Document in Chart     Influenza panel by PCR (type A & B)     Status: None   Collection Time: 04/28/17  6:25 PM  Result Value Ref Range   Influenza A By PCR NEGATIVE NEGATIVE   Influenza B By PCR NEGATIVE NEGATIVE    Comment: (NOTE) The Xpert Xpress  Flu assay is intended as an aid in the diagnosis of  influenza and should not be used as a sole basis for treatment.  This  assay is FDA approved for nasopharyngeal swab specimens only. Nasal  washings and aspirates are unacceptable for Xpert Xpress Flu testing. Performed at [facility], [place] [address], [place], [place] [address]   Urinalysis, Complete w Microscopic     Status: Abnormal   Collection Time: 04/28/17  7:14 PM  Result Value Ref Range   Color, Urine YELLOW YELLOW   APPearance CLEAR CLEAR   Specific Gravity, Urine 1.012 1.005 - 1.030   pH 7.0 5.0 - 8.0   Glucose, UA >=500 (A) NEGATIVE mg/dL   Hgb urine dipstick NEGATIVE NEGATIVE   Bilirubin Urine NEGATIVE NEGATIVE   Ketones, ur NEGATIVE NEGATIVE mg/dL   Protein, ur 30 (A) NEGATIVE mg/dL   Nitrite NEGATIVE NEGATIVE   Leukocytes, UA NEGATIVE NEGATIVE   RBC / HPF 0-5 0 - 5 RBC/hpf   WBC, UA 0-5 0 - 5 WBC/hpf   Bacteria, UA RARE (A) NONE SEEN   Squamous Epithelial / LPF NONE SEEN NONE SEEN   Mucus PRESENT     Comment: Performed at [facility] [address] [place] [address], [place], [place] [address]  Troponin I     Status: None   Collection Time: 04/28/17  7:49 PM  Result Value Ref Range   Troponin I <0.03 <0.03 ng/mL    Comment: Performed at [facility] [address] [place], [place] [address]  Hemoglobin A1c     Status: Abnormal   Collection Time: 04/28/17  7:49 PM  Result Value Ref Range   Hgb A1c MFr Bld 9.4 (H) 4.8 - 5.6 %    Comment: (NOTE) Pre diabetes:          5.7%-6.4% Diabetes:              >6.4% Glycemic control for   <7.0% adults with diabetes    Mean Plasma Glucose 223.08 mg/dL    Comment: Performed at [facility] [address], [place], [place]  [address]  Glucose, capillary     Status: Abnormal   Collection Time: 04/28/17  9:09 PM  Result Value Ref Range   Glucose-Capillary 223 (H) 65 - 99 mg/dL   Comment 1 Notify RN    Comment 2 Document in Chart   Troponin I     Status: Abnormal   Collection Time: 04/28/17 10:28 PM  Result Value Ref Range   Troponin I 0.03 (HH) <0.03 ng/mL    Comment: CRITICAL RESULT CALLED TO, READ BACK BY AND VERIFIED WITH: RASUL G,RN 04/28/17 2350 WAYK Performed at [facility], [place] [address], [place], [place] [address]   Troponin I     Status: Abnormal   Collection Time: 04/29/17 12:51 AM  Result Value Ref Range   Troponin I 0.03 (HH) <0.03 ng/mL    Comment: CRITICAL VALUE NOTED.  VALUE IS CONSISTENT WITH PREVIOUSLY REPORTED AND CALLED VALUE. Performed at [facility], [place] [address], [place], [place] [address]   Troponin I     Status: Abnormal   Collection Time: 04/29/17  4:29 AM  Result Value Ref Range   Troponin I 0.03 (HH) <0.03 ng/mL    Comment: CRITICAL VALUE NOTED.  VALUE IS CONSISTENT WITH PREVIOUSLY REPORTED AND CALLED VALUE. Performed at [facility], [place] [address], [place],  [address]   CBC with Differential/Platelet  Status: None   Collection Time: 04/29/17  4:29 AM  Result Value Ref Range   WBC 8.1 4.0 - 10.5 K/uL   RBC 4.95 3.87 - 5.11 MIL/uL   Hemoglobin 13.9 12.0 - 15.0 g/dL   HCT 42.4 36.0 - 46.0 %   MCV 85.7 78.0 - 100.0 fL   MCH 28.1 26.0 - 34.0 pg   MCHC 32.8 30.0 - 36.0 g/dL   RDW 15.1 11.5 - 15.5 %   Platelets 192 150 - 400 K/uL   Neutrophils Relative % 64 %   Neutro Abs 5.2 1.7 - 7.7 K/uL   Lymphocytes Relative 26 %   Lymphs Abs 2.1 0.7 - 4.0 K/uL   Monocytes Relative 7 %   Monocytes Absolute 0.6 0.1 - 1.0 K/uL   Eosinophils Relative 3 %   Eosinophils Absolute 0.3 0.0 - 0.7 K/uL   Basophils Relative 0 %   Basophils Absolute 0.0 0.0 - 0.1 K/uL    Comment: Performed at [facility], [address] [place], [place] [address]    Comprehensive metabolic panel     Status: Abnormal   Collection Time: 04/29/17  4:29 AM  Result Value Ref Range   Sodium 135 135 - 145 mmol/L   Potassium 3.0 (L) 3.5 - 5.1 mmol/L   Chloride 95 (L) 101 - 111 mmol/L   CO2 23 22 - 32 mmol/L   Glucose, Bld 197 (H) 65 - 99 mg/dL   BUN 25 (H) 6 - 20 mg/dL   Creatinine, Ser 2.13 (H) 0.44 - 1.00 mg/dL   Calcium 8.9 8.9 - 10.3 mg/dL   Total Protein 6.4 (L) 6.5 - 8.1 g/dL   Albumin 3.2 (L) 3.5 - 5.0 g/dL   AST 46 (H) 15 - 41 U/L   ALT 91 (H) 14 - 54 U/L   Alkaline Phosphatase 361 (H) 38 - 126 U/L   Total Bilirubin 1.1 0.3 - 1.2 mg/dL   GFR calc non Af Amer 21 (L) >60 mL/min   GFR calc Af Amer 25 (L) >60 mL/min    Comment: (NOTE) The eGFR has been calculated using the CKD EPI equation. This calculation has not been validated in all clinical situations. eGFR's persistently <60 mL/min signify possible Chronic Kidney Disease.    Anion gap 17 (H) 5 - 15    Comment: Performed at [facility], [place] [address] [place], [place] [address]  Magnesium     Status: None   Collection Time: 04/29/17  4:29 AM  Result Value Ref Range   Magnesium 2.2 1.7 - 2.4 mg/dL    Comment: Performed at [facility] [address] [place], [place] [address]  Phosphorus     Status: None   Collection Time: 04/29/17  4:29 AM  Result Value Ref Range   Phosphorus 4.2 2.5 - 4.6 mg/dL    Comment: Performed at [facility] [address], [place], [place] [address]  Glucose, capillary     Status: Abnormal   Collection Time: 04/29/17  7:44 AM  Result Value Ref Range   Glucose-Capillary 184 (H) 65 - 99 mg/dL   Comment 1 Notify RN     ECG   A sensed - v paced rhythm at 74- Personally Reviewed  Telemetry   V paced - Personally Reviewed  Radiology    Dg Chest 2 View  Result Date: 04/27/2017 CLINICAL DATA:  Shortness of Breath EXAM: CHEST  2 VIEW COMPARISON:  March 25, 2017 FINDINGS: There  is scarring in the left base region. Lungs elsewhere  are clear. Heart size and pulmonary vascularity are normal. Pacemaker leads are attached to the right atrium and right ventricle. There is aortic atherosclerosis. No adenopathy. Patient is status post total shoulder replacement on the right. IMPRESSION: Scarring left base. No edema or consolidation. Heart size normal. Pacemaker leads attached to right atrium and right ventricle. Aortic atherosclerosis present. No adenopathy evident. Aortic Atherosclerosis (ICD10-I70.0). Electronically Signed   By: Lowella Grip III M.D.   On: 04/27/2017 15:22    Cardiac Studies   LV EF: 50% -   55%  ------------------------------------------------------------------- Indications:      Dyspnea 786.09.  ------------------------------------------------------------------- History:   PMH:  Sleep Apnea. Thyroid Disease.  Coronary artery disease.  Congestive heart failure.  Chronic obstructive pulmonary disease.  PMH:   Myocardial infarction.  Risk factors:  Diabetes mellitus. Dyslipidemia.  ------------------------------------------------------------------- Study Conclusions  - Procedure narrative: Transthoracic echocardiography. Technically   difficult study with reduced echo windows. - Left ventricle: The cavity size was normal. Wall thickness was   increased in a pattern of mild LVH. Systolic function was normal.   The estimated ejection fraction was in the range of 50% to 55%.   Doppler parameters are consistent with abnormal left ventricular   relaxation (grade 1 diastolic dysfunction). The E/e&' ratio is   between 8-15, suggesting indeterminate LV filling pressure. - Aortic valve: Trileaflet. Sclerosis without stenosis. There was   trivial regurgitation. - Mitral valve: Mildly thickened leaflets . There was trivial   regurgitation. - Left atrium: The atrium was normal in size. - Inferior vena cava: The vessel was normal in size. The   respirophasic diameter changes were in the normal range (>=  50%),   consistent with normal central venous pressure.  Impressions:  - Compared to a prior study in 10/2016, there has been no   significant change.  Assessment   1. Active Problems: 2.   Paroxysmal atrial fibrillation (HCC) 3.   Obesity, Class II, BMI 35-39.9 4.   GERD with stricture 5.   Diastolic CHF ([place]) 6.   CKD (chronic kidney disease) stage 3, GFR 30-59 ml/min (HCC) 7.   COPD (chronic obstructive pulmonary disease) ([place]) 8.   Hypothyroidism following radioiodine therapy 9.   Coronary artery disease 10.   Hyperlipidemia 11.   Chest pain 12.   SSS (sick sinus syndrome) ([place]) 13.   Uncontrolled type 2 diabetes mellitus with hyperglycemia, without long-term current use of insulin ([place]) 14.   OSA (obstructive sleep apnea) 15.   Plan   1. Dyspnea markedly improved yesterday - she can almost say when, suggesting that she converted back to sinus at that time. Clearly in sinus today - A sensed, v-paced, echo shows A waves to corroborate this - LVEF stable. No evidence for CHF or coronary ischemia. Abnormal liver enzymes - agree with ultrasound. Still dyspneic. Ddimer negative. Will defer to hospitalist for further work-up. Possible cath next week based on renal function.  Time Spent Directly with Patient:  I have spent a total of 25 minutes with the patient reviewing hospital notes, telemetry, EKGs, labs and examining the patient as well as establishing an assessment and plan that was discussed personally with the patient. > 50% of time was spent in direct patient care.  Length of Stay:  LOS: 0 days   Pixie Casino, MD, [facility], [place] Director of the Advanced Lipid Disorders &  Cardiovascular Risk Reduction  Clinic Diplomate of the American Board of Clinical Lipidology Attending Cardiologist  Direct Dial: [phone]  Fax: [phone]  Website:  [email or web address] 04/29/2017, 9:55 AM

## 2017-04-29 NOTE — Evaluation (Signed)
Physical Therapy Evaluation Patient Details Name: Dawn Garcia MRN: [id number] DOB: 05/16/1940 Today's Date: 04/29/2017   History of Present Illness  Pt is a 77 y/o female admitted secondary to SOB and weakness. PMH including but not limited to a-fib, CAD, CHF, COPD, DM, hx of MI and pacemaker placement in 2017.    Clinical Impression  Pt presented sitting EOB, awake and willing to participate in therapy session. Pt's husband present throughout session as well. Prior to admission, pt reported that she was independent with all functional mobility and ADLs. Pt lives in an independent living facility with her husband. She is currently very limited in functional mobility secondary to fatigue and poor endurance. Pt ambulated a very short distance within her room without use of an AD, no overt LOB or need for physical assistance. Pt would continue to benefit from skilled physical therapy services at this time while admitted and after d/c to address the below listed limitations in order to improve overall safety and independence with functional mobility.     Follow Up Recommendations Home health PT    Equipment Recommendations  None recommended by PT    Recommendations for Other Services       Precautions / Restrictions Precautions Precautions: None Restrictions Weight Bearing Restrictions: No      Mobility  Bed Mobility               General bed mobility comments: pt sitting EOB upon arrival  Transfers Overall transfer level: Needs assistance Equipment used: None Transfers: Sit to/from Stand Sit to Stand: Supervision         General transfer comment: supervision for safety  Ambulation/Gait Ambulation/Gait assistance: Min guard;Supervision Ambulation Distance (Feet): 25 Feet Assistive device: None Gait Pattern/deviations: Step-through pattern;Decreased stride length Gait velocity: decreased Gait velocity interpretation: Below normal speed for age/gender General  Gait Details: pt very limited in distance secondary to poor endurance and fatigue. Pt steady without use of an AD, no LOB or need for physical assistance  Stairs            Wheelchair Mobility    Modified Rankin (Stroke Patients Only)       Balance Overall balance assessment: Needs assistance Sitting-balance support: Feet supported Sitting balance-Leahy Scale: Good     Standing balance support: During functional activity;No upper extremity supported Standing balance-Leahy Scale: Fair                               Pertinent Vitals/Pain Pain Assessment: No/denies pain    Home Living Family/patient expects to be discharged to:: Private residence Living Arrangements: Spouse/significant other Available Help at Discharge: Family Type of Home: Independent living facility Home Access: Elevator     Home Layout: One level Home Equipment: None      Prior Function Level of Independence: Independent               Hand Dominance        Extremity/Trunk Assessment   Upper Extremity Assessment Upper Extremity Assessment: Defer to OT evaluation    Lower Extremity Assessment Lower Extremity Assessment: Overall WFL for tasks assessed       Communication   Communication: No difficulties  Cognition Arousal/Alertness: Awake/alert Behavior During Therapy: WFL for tasks assessed/performed Overall Cognitive Status: Within Functional Limits for tasks assessed  General Comments      Exercises     Assessment/Plan    PT Assessment Patient needs continued PT services  PT Problem List Decreased strength;Decreased activity tolerance;Decreased balance;Decreased mobility;Decreased coordination;Cardiopulmonary status limiting activity       PT Treatment Interventions DME instruction;Gait training;Stair training;Functional mobility training;Therapeutic activities;Therapeutic exercise;Balance  training;Neuromuscular re-education;Patient/family education    PT Goals (Current goals can be found in the Care Plan section)  Acute Rehab PT Goals Patient Stated Goal: return home, improve strength PT Goal Formulation: With patient/family Time For Goal Achievement: 05/13/17 Potential to Achieve Goals: Good    Frequency Min 3X/week   Barriers to discharge        Co-evaluation               AM-PAC PT "6 Clicks" Daily Activity  Outcome Measure Difficulty turning over in bed (including adjusting bedclothes, sheets and blankets)?: None Difficulty moving from lying on back to sitting on the side of the bed? : None Difficulty sitting down on and standing up from a chair with arms (e.g., wheelchair, bedside commode, etc,.)?: None Help needed moving to and from a bed to chair (including a wheelchair)?: None Help needed walking in hospital room?: A Little Help needed climbing 3-5 steps with a railing? : A Little 6 Click Score: 22    End of Session   Activity Tolerance: Patient limited by fatigue Patient left: in bed;with call bell/phone within reach;with family/visitor present(sitting EOB) Nurse Communication: Mobility status PT Visit Diagnosis: Other abnormalities of gait and mobility (R26.89)    Time: 1002-1020 PT Time Calculation (min) (ACUTE ONLY): 18 min   Charges:   PT Evaluation $PT Eval Moderate Complexity: 1 Mod     PT G Codes:        [place], PT, DPT [facility] 04/29/2017, 11:54 AM

## 2017-04-30 ENCOUNTER — Inpatient Hospital Stay (HOSPITAL_COMMUNITY): Payer: Medicare Other

## 2017-04-30 DIAGNOSIS — N289 Disorder of kidney and ureter, unspecified: Secondary | ICD-10-CM

## 2017-04-30 DIAGNOSIS — R69 Illness, unspecified: Secondary | ICD-10-CM

## 2017-04-30 DIAGNOSIS — J439 Emphysema, unspecified: Secondary | ICD-10-CM

## 2017-04-30 LAB — GLUCOSE, CAPILLARY
GLUCOSE-CAPILLARY: 183 mg/dL — AB (ref 65–99)
GLUCOSE-CAPILLARY: 242 mg/dL — AB (ref 65–99)
Glucose-Capillary: 190 mg/dL — ABNORMAL HIGH (ref 65–99)
Glucose-Capillary: 304 mg/dL — ABNORMAL HIGH (ref 65–99)

## 2017-04-30 LAB — CBC WITH DIFFERENTIAL/PLATELET
BASOS PCT: 0 %
Basophils Absolute: 0 10*3/uL (ref 0.0–0.1)
EOS ABS: 0.2 10*3/uL (ref 0.0–0.7)
Eosinophils Relative: 3 %
HEMATOCRIT: 41.2 % (ref 36.0–46.0)
HEMOGLOBIN: 13.5 g/dL (ref 12.0–15.0)
Lymphocytes Relative: 27 %
Lymphs Abs: 2 10*3/uL (ref 0.7–4.0)
MCH: 28.2 pg (ref 26.0–34.0)
MCHC: 32.8 g/dL (ref 30.0–36.0)
MCV: 86.2 fL (ref 78.0–100.0)
Monocytes Absolute: 0.4 10*3/uL (ref 0.1–1.0)
Monocytes Relative: 6 %
NEUTROS ABS: 4.6 10*3/uL (ref 1.7–7.7)
NEUTROS PCT: 64 %
Platelets: 197 10*3/uL (ref 150–400)
RBC: 4.78 MIL/uL (ref 3.87–5.11)
RDW: 15.1 % (ref 11.5–15.5)
WBC: 7.2 10*3/uL (ref 4.0–10.5)

## 2017-04-30 LAB — COMPREHENSIVE METABOLIC PANEL
ALBUMIN: 2.8 g/dL — AB (ref 3.5–5.0)
ALK PHOS: 387 U/L — AB (ref 38–126)
ALT: 66 U/L — AB (ref 14–54)
AST: 39 U/L (ref 15–41)
Anion gap: 12 (ref 5–15)
BILIRUBIN TOTAL: 0.9 mg/dL (ref 0.3–1.2)
BUN: 25 mg/dL — ABNORMAL HIGH (ref 6–20)
CALCIUM: 9.4 mg/dL (ref 8.9–10.3)
CO2: 21 mmol/L — AB (ref 22–32)
CREATININE: 1.78 mg/dL — AB (ref 0.44–1.00)
Chloride: 104 mmol/L (ref 101–111)
GFR calc Af Amer: 31 mL/min — ABNORMAL LOW (ref >60)
GFR calc non Af Amer: 27 mL/min — ABNORMAL LOW (ref >60)
GLUCOSE: 203 mg/dL — AB (ref 65–99)
Potassium: 3.7 mmol/L (ref 3.5–5.1)
SODIUM: 137 mmol/L (ref 135–145)
TOTAL PROTEIN: 5.9 g/dL — AB (ref 6.5–8.1)

## 2017-04-30 LAB — HEPATITIS PANEL, ACUTE
HEP B S AG: NEGATIVE
Hep A IgM: NEGATIVE
Hep B C IgM: NEGATIVE

## 2017-04-30 LAB — MAGNESIUM: Magnesium: 2.2 mg/dL (ref 1.7–2.4)

## 2017-04-30 LAB — PHOSPHORUS: Phosphorus: 3.4 mg/dL (ref 2.5–4.6)

## 2017-04-30 MED ORDER — POTASSIUM CHLORIDE IN NACL 20-0.9 MEQ/L-% IV SOLN
INTRAVENOUS | Status: AC
  Administered 2017-04-30: 12:00:00 via INTRAVENOUS

## 2017-04-30 MED ORDER — DICLOFENAC SODIUM 1 % TD GEL
2.0000 g | Freq: Four times a day (QID) | TRANSDERMAL | Status: DC | PRN
  Administered 2017-04-30 (×2): 2 g via TOPICAL
  Filled 2017-04-30: qty 100

## 2017-04-30 MED ORDER — LIVING WELL WITH DIABETES BOOK
Freq: Once | Status: AC
  Administered 2017-05-01: 01:00:00
  Filled 2017-04-30: qty 1

## 2017-04-30 MED ORDER — INSULIN GLARGINE 100 UNIT/ML ~~LOC~~ SOLN
20.0000 [IU] | Freq: Every day | SUBCUTANEOUS | Status: DC
  Administered 2017-04-30 – 2017-05-03 (×4): 20 [IU] via SUBCUTANEOUS
  Filled 2017-04-30 (×5): qty 0.2

## 2017-04-30 NOTE — Progress Notes (Signed)
PROGRESS NOTE    Dawn Garcia  KXF:[id number] DOB: [birth year] DOA: 04/28/2017 PCP: Glean Hess, MD   Brief Narrative:  Dawn Garcia is a 77 y.o. 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 States she feels like this when she goes into Atrial Fibrillation and has had an ablation in the past. Was recently evaluated by EP Cardiology by Dr. Caryl Comes yesterday and Dr. Curt Bears today and was sent to the ED for evaluation given concern that SOB maybe something else besides PAF. She endorses no leg swelling and states she was recently taken off of Amiodarone 2 days ago. Has been having intermittent Chest Pressure that is left side associated with this SOB. Admits to being Lightheaded and sometimes dizzy with these episodes and also has tremendous fatigue and weakness. No Nausea or vomting. [facility] was called to evaluate and admit patient for Acute Dyspnea/Dyspnea on Exertion and CP r/o ACS. Patient's Troponin is stable and ECHO has not changed. Patient's SOB is improved and no evidence for CHF or coronary ischemia per Cardiology Dr. Debara Pickett. Awaiting EP's input about Cath and feel like they are going to do it tomorrow vs. Tuesday based on Creatinine. Cardiology ordering High Resolution CT Scan today.   Assessment & Plan:   Active Problems:   Paroxysmal atrial fibrillation (HCC)   Obesity, Class II, BMI 35-39.9   GERD with stricture   Diastolic CHF (HCC)   CKD (chronic kidney disease) stage 3, GFR 30-59 ml/min (HCC)   COPD (chronic obstructive pulmonary disease) (HCC)   Hypothyroidism following radioiodine therapy   Coronary artery disease   Hyperlipidemia   Chest pain   SSS (sick sinus syndrome) ([place])   Uncontrolled type 2 diabetes mellitus with hyperglycemia, without long-term current  use of insulin (HCC)   OSA (obstructive sleep apnea)  Acute Dyspnea/Dyspnea on Exertion -Progressively worsening the last few weeks; ? Related to PAF vs. COPD (unlikley as patient wasn't wheezing) -Checked Patient for Respiratory Virus Panel and Flu and was Negative.  -Cardiology Checkied D-Dimer and was <0.27; Will not get V/Q Scan -Cardiology ordering High Resolution CT to evaluate SOB -High Resolution CT Scan showed no acute Cardiopulmonary Disease. No evidence of interstitial lung disease, mild postinfectious/postinflammatory scarring in the mid to lower lungs which was stable, three-vessel coronary atherosclerosis, and stable borderline prominent main pulmonary artery.  -Xopenex PRN and added Mucinex -Checked ECHOCardiogram as below -CXR done 04/27/16 showed Scarring left base. No edema or consolidation. Heart size normal. Pacemaker leads attached to right atrium and right ventricle. Aortic atherosclerosis present. No adenopathy evident -Repeat CXR in AM  -PT/OT Recommending Home Health PT -Unclear whether patient to undergo Heart Catheterization tomorrow or Tuesday; Defer to Cardiology for timing.   Chest Pain r/o ACS in a patient with CAD and Hx of MI -Has been having intermittent CP, but improved. ? Related to A Fib.  -C/w NTG 0.4 mg SL q23minPRN -Cycle Troponin x3 and were Flat at 0.03 x3 -Repeat EKG Sinus -C/w Telemetry -Obtained Echocardiogram and showed EF of 50-55% with Grade 1 DD -Cardiology Evaluating and considering Ischemic Assessment with Cardiac Catheterization but are unsure at this point as it is contingent on Cr; Dr. Debara Pickett at this point feels she has no evidence of coronary ischemia. EP to evaluate and deferring whether she needs Cardiac Catheterization.  -Dr. Caryl Comes recommending repeating Left  and Right Cardiac Catheterization tomorrow or Tuesday depending on Cr  Paroxysmal Atrial Fibrillation -Was found to be in Atrial Fibrillation when Device was interrogated as an  outpatient and spontaneously converted to NSR today in the ED -C/w Anticoagulation with Eliquis -C/w Telemetry -Recently stopped Amiodarone given ineffectiveness of keeping NSR; Cardiology checking an Amiodarone Level to see whether she maybe a candidate for Dofetilide -Cardiology Consulted and appreciate further recommendations; EP to evaluate -Medical Cardiology feels she is in Sinus   OSA -C/w Home CPAP  AKI on CKD Stage 3, improving  -Upon Further review Baseline Cr appears to be around 1.1-1.3 -Patient Took her Home Torsemide this AM and likley is Dehydrated based on Labs -Avoid Nephrotoxic Medications if Possible -BUN/Cr has worsened and went from 21/1.60 -> 23/2.02 -> 25/2.13 -> 25/1.78 -Gentle IVF Rehydration with NS at 50 mL/hr x 15 hours repeated today as Cr is improving -Continue to Monitor and Repeat CMP in AM   Uncontrolled Hyperglycemia in the Setting of Diabetes Mellitus Type 2 -Hold Jardiance and Metformin and encourage Discontinuation at D/C -Checked HbA1c and was 9.4; Last one in September 18 was 7.8 -Started Lantus 10 units qHS and increased to 15 units yesterday; Will further increase to 20 units sq Daily and C/w Moderate Novolog SSI AC -Appreciate Diabetic Education Coordinator Recommendations -Continue to Monitor CBG's; CBG's ranging from 183-304  Hypothyroidism -S/p Radioactive Thyroid Ablation  -Patient recently had Thyroid studies 2 days ago which showed patient had a TSH of 0.198 (low), T3 of 1.8 (low), and Free T4 of 3.54 (high) -Upon review of patient's Medication list, patient unfortunately has been taking Biotin 1000 mcg po Daily which can alter Thyroid Assay results and can falsely elevate T4 and in some cases make TSH look lower in lab settings  -Patient has also been taking Amiodarone (recently stopped 2 days ago)  which decreases Free T3 Conversion from Free T4 -Patient's Thyroid Studies therefore are inaccurate and patient needs to be off of Biotin  at least 3 days prior to Rechecking TSH, Free T3, and Free T4 -Will not check again this hospitalization and will continue her Levothyroxine 150 mcg po Daily  -Case was discussed with Dr. Buddy Duty of Endocrinology and Patient to follow up with Dr. Buddy Duty at D/C and he will check her labs and adjust medications as necessary  Chronic Diastolic CHF -Currently not Decompensated -Actually looks on the drier side still -Strict I's/O's, Daily Weights -Hold Torsemide for now given AKI on CKD; Started Gentle IVF Rehydration with NS at 50 mL/hr given worsening Kidney Function  -Patient is -800 mL  Dyslipidemia/HLD -C/w Rosuvastatin 40 mg po Daily   SSS s/p PPM -C/w Telemetry -Cardiology Following and appreciate further recommendations   COPD -Currently do not feel she is in Exacerbation -C/w prn Xopenex Nebs -Cardiology checking a High Resolution CT Scan today   Hypokalemia -Patient's K+ was 3.0 and improved to 3.7 -C/w IVF with NS + 20 mEQ of KCl at 50 mL/hr x another 15 hours -Continue to Monitor and Replete as Necessary -Repeat CMP in AM  Abnormal LFT's -? Related to Hypoperfusion in the Setting of A Fib. -AST was 46 and ALT was 91; Repeat AST this AM was 39, and ALT was 66 -Obtained RUQ U/S and showed previous cholecystectomy and no acute findings. There was mild hepatic steatosis without focal mass -Checked Acute Hepatitis Panel and was Negative    DVT prophylaxis: Anticoagulated with Apixaban Code Status: FULL CODE Family Communication: No family present at  bedside  Disposition Plan: D/C Home with Home Health when Cleared by Cardiology  Consultants:  Cardiology   Procedures:  ECHOCARDIOGRAM ------------------------------------------------------------------- Study Conclusions  - Procedure narrative: Transthoracic echocardiography. Technically   difficult study with reduced echo windows. - Left ventricle: The cavity size was normal. Wall thickness was   increased in a  pattern of mild LVH. Systolic function was normal.   The estimated ejection fraction was in the range of 50% to 55%.   Doppler parameters are consistent with abnormal left ventricular   relaxation (grade 1 diastolic dysfunction). The E/e&' ratio is   between 8-15, suggesting indeterminate LV filling pressure. - Aortic valve: Trileaflet. Sclerosis without stenosis. There was   trivial regurgitation. - Mitral valve: Mildly thickened leaflets . There was trivial   regurgitation. - Left atrium: The atrium was normal in size. - Inferior vena cava: The vessel was normal in size. The   respirophasic diameter changes were in the normal range (>= 50%),   consistent with normal central venous pressure.  Impressions:  - Compared to a prior study in 10/2016, there has been no   significant change.   Antimicrobials:  Anti-infectives (From admission, onward)   None     Subjective: Seen and examined and felt better than when coming in but states she never used to be this SOB when ambulating. Feels like A Fib had a big part to do with it. No CP. No lightheadedness or dizziness. No nausea or vomiting.   Objective: Vitals:   04/29/17 2144 04/30/17 0005 04/30/17 0430 04/30/17 1156  BP: 123/69 (!) 104/55 110/63 (!) 146/73  Pulse: 72 74 70 69  Resp: 20 20 20 20   Temp: 98.5 F (36.9 C) 98.3 F (36.8 C) 97.6 F (36.4 C) 98.7 F (37.1 C)  TempSrc: Oral Oral Oral Oral  SpO2: 94% 96% 96% 95%  Weight:   90.5 kg (199 lb 8 oz)   Height:        Intake/Output Summary (Last 24 hours) at 04/30/2017 1556 Last data filed at 04/30/2017 1537 Gross per 24 hour  Intake 1360 ml  Output 1900 ml  Net -540 ml   Filed Weights   04/28/17 2027 04/29/17 0535 04/30/17 0430  Weight: 89.2 kg (196 lb 9.6 oz) 89.5 kg (197 lb 4.8 oz) 90.5 kg (199 lb 8 oz)   Examination: Physical Exam:  Constitutional: WN/WD obese Caucasian female in NAD; Appears calm and comfortable sitting in chair bedside.  Eyes: Sclerae  anicteric. Lids normal.  ENMT: External Ears and Nose appear normal. Grossly normal hearing Neck: Supple with no JVD appreciated Respiratory: Diminished to auscultation but unlabored breathing and no wheezing/rales/rhonchi; Not wearing supplemental O2 via [place] Cardiovascular: RRR; No appreciable m/r/g. No appreciable LE edema Abdomen: Soft, NT, Distended due to body habitus. Has a hernia. Bowel sounds present  GU: Deferred Musculoskeletal: No contractures; No cyanosis Skin: Warm and dry; No appreciable rashes, lesions on a limited skin evaluation Neurologic: CN 2-12 grossly intact. No appreciable focal deficits  Psychiatric: Normal mood and affect. Intact judgement and insight.  Data Reviewed: I have personally reviewed following labs and imaging studies  CBC: Recent Labs  Lab 04/28/17 1107 04/29/17 0429 04/30/17 0736  WBC 10.1 8.1 7.2  NEUTROABS  --  5.2 4.6  HGB 14.9 13.9 13.5  HCT 44.5 42.4 41.2  MCV 86.1 85.7 86.2  PLT 193 192 676   Basic Metabolic Panel: Recent Labs  Lab 04/26/17 1642 04/28/17 1107 04/29/17 0429 04/30/17 0736  NA 141 134*  135 137  K 4.3 3.5 3.0* 3.7  CL 94* 93* 95* 104  CO2 23 23 23  21*  GLUCOSE 310* 418* 197* 203*  BUN 19 23* 25* 25*  CREATININE 1.69* 2.02* 2.13* 1.78*  CALCIUM 9.6 9.6 8.9 9.4  MG  --   --  2.2 2.2  PHOS  --   --  4.2 3.4   GFR: Estimated Creatinine Clearance: 28.7 mL/min (A) (by C-G formula based on SCr of 1.78 mg/dL (H)). Liver Function Tests: Recent Labs  Lab 04/29/17 0429 04/30/17 0736  AST 46* 39  ALT 91* 66*  ALKPHOS 361* 387*  BILITOT 1.1 0.9  PROT 6.4* 5.9*  ALBUMIN 3.2* 2.8*   No results for input(s): LIPASE, AMYLASE in the last 168 hours. No results for input(s): AMMONIA in the last 168 hours. Coagulation Profile: No results for input(s): INR, PROTIME in the last 168 hours. Cardiac Enzymes: Recent Labs  Lab 04/28/17 1949 04/28/17 2228 04/29/17 0051 04/29/17 0429 04/29/17 0928  TROPONINI <0.03 0.03*  0.03* 0.03* <0.03   BNP (last 3 results) No results for input(s): PROBNP in the last 8760 hours. HbA1C: Recent Labs    04/28/17 1949  HGBA1C 9.4*   CBG: Recent Labs  Lab 04/29/17 1146 04/29/17 1627 04/29/17 2143 04/30/17 0736 04/30/17 1117  GLUCAP 307* 236* 236* 190* 304*   Lipid Profile: No results for input(s): CHOL, HDL, LDLCALC, TRIG, CHOLHDL, LDLDIRECT in the last 72 hours. Thyroid Function Tests: No results for input(s): TSH, T4TOTAL, FREET4, T3FREE, THYROIDAB in the last 72 hours. Anemia Panel: No results for input(s): VITAMINB12, FOLATE, FERRITIN, TIBC, IRON, RETICCTPCT in the last 72 hours. Sepsis Labs: No results for input(s): PROCALCITON, LATICACIDVEN in the last 168 hours.  Recent Results (from the past 240 hour(s))  Respiratory Panel by PCR     Status: None   Collection Time: 04/28/17  6:25 PM  Result Value Ref Range Status   Adenovirus NOT DETECTED NOT DETECTED Final   Coronavirus 229E NOT DETECTED NOT DETECTED Final   Coronavirus HKU1 NOT DETECTED NOT DETECTED Final   Coronavirus NL63 NOT DETECTED NOT DETECTED Final   Coronavirus OC43 NOT DETECTED NOT DETECTED Final   Metapneumovirus NOT DETECTED NOT DETECTED Final   Rhinovirus / Enterovirus NOT DETECTED NOT DETECTED Final   Influenza A NOT DETECTED NOT DETECTED Final   Influenza B NOT DETECTED NOT DETECTED Final   Parainfluenza Virus 1 NOT DETECTED NOT DETECTED Final   Parainfluenza Virus 2 NOT DETECTED NOT DETECTED Final   Parainfluenza Virus 3 NOT DETECTED NOT DETECTED Final   Parainfluenza Virus 4 NOT DETECTED NOT DETECTED Final   Respiratory Syncytial Virus NOT DETECTED NOT DETECTED Final   Bordetella pertussis NOT DETECTED NOT DETECTED Final   Chlamydophila pneumoniae NOT DETECTED NOT DETECTED Final   Mycoplasma pneumoniae NOT DETECTED NOT DETECTED Final    Comment: Performed at [facility], [address], [place],  [address]    Radiology Studies: US Abdomen Limited  Ruq  Result Date: 04/29/2017 CLINICAL DATA:  Abnormal LFTs.  Previous cholecystectomy. EXAM: ULTRASOUND ABDOMEN LIMITED RIGHT UPPER QUADRANT COMPARISON:  None. FINDINGS: Gallbladder: Previous cholecystectomy. Common bile duct: Diameter: 8.0 mm. Liver: Findings suggesting mild hepatic steatosis without focal mass. Portal vein is patent on color Doppler imaging with normal direction of blood flow towards the liver. IMPRESSION: Previous cholecystectomy.  No acute findings. Mild hepatic steatosis without focal mass. Electronically Signed   By: Marin Olp M.D.   On: 04/29/2017 13:55   Scheduled  Meds: . apixaban  5 mg Oral BID  . B-complex with vitamin C  1 tablet Oral Daily  . cholecalciferol  5,000 Units Oral q morning - 10a  . docusate sodium  100 mg Oral Daily  . guaiFENesin  600 mg Oral BID  . insulin aspart  0-15 Units Subcutaneous TID WC  . insulin glargine  15 Units Subcutaneous QHS  . levothyroxine  150 mcg Oral QAC breakfast  . pantoprazole  40 mg Oral Daily  . rosuvastatin  40 mg Oral Daily  . vitamin B-12  500 mcg Oral Daily   Continuous Infusions: . 0.9 % NaCl with KCl 20 mEq / L 50 mL/hr at 04/30/17 1204    LOS: 1 day   Kerney Elbe, DO [facility] Pager [phone]  If 7PM-7AM, please contact night-coverage [email or web address] Password [facility] 04/30/2017, 3:56 PM

## 2017-04-30 NOTE — Evaluation (Signed)
Occupational Therapy Evaluation Patient Details Name: Miosotis Wetsel MRN: [id number] DOB: [birth year] Today's Date: 04/30/2017    History of Present Illness Pt is a 77 y.o. female admitted secondary to SOB and weakness. PMH including but not limited to a-fib, CAD, CHF, COPD, DM, hx of MI and pacemaker placement in 2017.   Clinical Impression   Pt requiring assist with ADLs, PTA. Feel pt will benefit from acute OT to increase independence and activity tolerance and reinforce energy conservation techniques prior to d/c. Recommending HHOT.     Follow Up Recommendations  Home health OT    Equipment Recommendations  Other (comment)(AE if wanted)    Recommendations for Other Services       Precautions / Restrictions Precautions Precautions: Fall Restrictions Weight Bearing Restrictions: No      Mobility Bed Mobility Overal bed mobility: Modified Independent                Transfers Overall transfer level: Needs assistance   Transfers: Sit to/from Stand Sit to Stand: Supervision              Balance    Min guard for ambulation-pt pushed IV pole part of the time.                                       ADL either performed or assessed with clinical judgement   ADL Overall ADL's : Needs assistance/impaired     Grooming: Wash/dry hands;Wash/dry face;Set up;Supervision/safety;Sitting;Standing(applied face cream)               Lower Body Dressing: Minimal assistance;Sit to/from stand   Toilet Transfer: Min guard;Ambulation;Regular Toilet   Toileting- Water quality scientist and Hygiene: Supervision/safety;Sit to/from stand       Functional mobility during ADLs: Min guard General ADL Comments: Educated on energy conservation techniques. Educated on safety such as sitting to get clothes on over feet. Talked about AE.     Vision         Perception     Praxis      Pertinent Vitals/Pain Pain Assessment: 0-10 Pain Score: 3  Pain  Location: back Pain Descriptors / Indicators: Aching;Tightness Pain Intervention(s): Monitored during session     Hand Dominance     Extremity/Trunk Assessment Upper Extremity Assessment Upper Extremity Assessment: Overall WFL for tasks assessed   Lower Extremity Assessment Lower Extremity Assessment: Defer to PT evaluation       Communication Communication Communication: No difficulties   Cognition Arousal/Alertness: Awake/alert Behavior During Therapy: WFL for tasks assessed/performed Overall Cognitive Status: Within Functional Limits for tasks assessed                                     General Comments       Exercises     Shoulder Instructions      Home Living Family/patient expects to be discharged to:: Private residence Living Arrangements: Spouse/significant other Available Help at Discharge: Family Type of Home: Independent living facility Home Access: Elevator     Home Layout: One level     Bathroom Shower/Tub: Occupational psychologist: Handicapped height     Home Equipment: Shower seat - built in          Prior Functioning/Environment Level of Independence: Needs assistance    ADL's / Homemaking Assistance Needed: needing assist  with ADLs and cooking on and off for last 6 weeks or so            OT Problem List: Decreased range of motion;Decreased activity tolerance;Impaired balance (sitting and/or standing);Decreased knowledge of precautions;Pain;Decreased strength      OT Treatment/Interventions: Self-care/ADL training;Energy conservation;DME and/or AE instruction;Therapeutic activities;Patient/family education;Balance training    OT Goals(Current goals can be found in the care plan section) Acute Rehab OT Goals Patient Stated Goal: build up activity tolerance OT Goal Formulation: With patient Time For Goal Achievement: 05/07/17 Potential to Achieve Goals: Good ADL Goals Pt Will Perform Lower Body Dressing:  with set-up;sit to/from stand Pt Will Transfer to Toilet: ambulating;with modified independence;grab bars Additional ADL Goal #1: Pt will independently verbalize 3 energy conservation techniques and utilize in session as needed.  OT Frequency: Min 2X/week   Barriers to D/C:            Co-evaluation              AM-PAC PT "6 Clicks" Daily Activity     Outcome Measure Help from another person eating meals?: None Help from another person taking care of personal grooming?: A Little Help from another person toileting, which includes using toliet, bedpan, or urinal?: A Little Help from another person bathing (including washing, rinsing, drying)?: A Little Help from another person to put on and taking off regular upper body clothing?: A Little Help from another person to put on and taking off regular lower body clothing?: A Little 6 Click Score: 19   End of Session    Activity Tolerance: Patient tolerated treatment well Patient left: in bed;with call bell/phone within reach;with family/visitor present;with bed alarm set  OT Visit Diagnosis: Pain;Other (comment)(decreased activity tolerance) Pain - Right/Left: Right Pain - part of body: (back)                Time: 2353-6144 OT Time Calculation (min): 20 min Charges:  OT General Charges $OT Visit: 1 Visit OT Evaluation $OT Eval Moderate Complexity: 1 Mod G-Codes:      Brittian Renaldo L Daqwan Dougal OTR/L 04/30/2017, 10:24 AM

## 2017-04-30 NOTE — Progress Notes (Signed)
Inpatient Diabetes Program Recommendations  AACE/ADA: New Consensus Statement on Inpatient Glycemic Control (2015)  Target Ranges:  Prepandial:   less than 140 mg/dL      Peak postprandial:   less than 180 mg/dL (1-2 hours)      Critically ill patients:  140 - 180 mg/dL   Lab Results  Component Value Date   GLUCAP 242 (H) 04/30/2017   HGBA1C 9.4 (H) 04/28/2017    Review of Glycemic Control  Diabetes history: DM2 Outpatient Diabetes medications: Jardiance 10 mg QD, metformin 500 mg bid Current orders for Inpatient glycemic control: Lantus 20 units QHS, Novolog 0-15 units tidwc  HgbA1C - 9.4% - uncontrolled Pt was previously on Amaryl. Started Jardiance on 04/26/2017  Inpatient Diabetes Program Recommendations:     Add Novolog 4 units tidwc for meal coverage insulin if pt eats > 50% meal Continue to titrate Lantus until FBS < 180 mg/dL Would benefit from OP Diabetes Education consult  Diabetes Coordinator to see in am.   Thank you. Lorenda Peck, RD, LDN, CDE Inpatient Diabetes Coordinator [phone]

## 2017-04-30 NOTE — Plan of Care (Signed)
  Education: Knowledge of General Education information will improve 04/30/2017 0102 - Progressing by Theador Hawthorne, RN   Activity: Risk for activity intolerance will decrease 04/30/2017 0102 - Progressing by Theador Hawthorne, RN   Nutrition: Adequate nutrition will be maintained 04/30/2017 0102 - Progressing by Theador Hawthorne, RN   Safety: Ability to remain free from injury will improve 04/30/2017 0102 - Progressing by Theador Hawthorne, RN   Skin Integrity: Risk for impaired skin integrity will decrease 04/30/2017 0102 - Progressing by Theador Hawthorne, RN

## 2017-04-30 NOTE — Progress Notes (Signed)
Progress Note  Patient Name: Dawn Garcia Date of Encounter: 04/30/2017  Primary Cardiologist: TG  Primary Electrophysiologist: SK/WC   Patient Profile     53F with remote stenting (date/vessel unknown), PAF with reported ablation previously, SSS s/p PPM, COPD, DM, HLD, CKD III, hypothyroidism (thyroid removal). Previously on dronedarone but stopped due to diarrhea. She had been started on amiodarone 03/2017 for recurrent AF   Troponin essentially negative (0.03 x 3) overnight - Creatinine elevated at 2.13. Hypokalemic today at 3.0. AST/ALT and ALK phos elevated. A1c 9.4. UA negative. Echo personally reviewed today - technically difficult study. LVEF unchanged at 50-55% - rhythm is sinus, with grade 1 DD, indeterminate LV filling pressure.    Subjective   SOB better than arrival but worse since yesterday   Notably she reverted to afib   Inpatient Medications    Scheduled Meds: . apixaban  5 mg Oral BID  . B-complex with vitamin C  1 tablet Oral Daily  . cholecalciferol  5,000 Units Oral q morning - 10a  . docusate sodium  100 mg Oral Daily  . guaiFENesin  600 mg Oral BID  . insulin aspart  0-15 Units Subcutaneous TID WC  . insulin glargine  15 Units Subcutaneous QHS  . levothyroxine  150 mcg Oral QAC breakfast  . pantoprazole  40 mg Oral Daily  . rosuvastatin  40 mg Oral Daily  . vitamin B-12  500 mcg Oral Daily   Continuous Infusions: . 0.9 % NaCl with KCl 20 mEq / L     PRN Meds: acetaminophen **OR** acetaminophen, diclofenac sodium, levalbuterol, nitroGLYCERIN, ondansetron **OR** ondansetron (ZOFRAN) IV, polyethylene glycol, traMADol   Vital Signs    Vitals:   04/29/17 1206 04/29/17 2144 04/30/17 0005 04/30/17 0430  BP: (!) 124/45 123/69 (!) 104/55 110/63  Pulse: 63 72 74 70  Resp: 20 20 20 20   Temp: 98 F (36.7 C) 98.5 F (36.9 C) 98.3 F (36.8 C) 97.6 F (36.4 C)  TempSrc: Oral Oral Oral Oral  SpO2: 93% 94% 96% 96%  Weight:    199 lb 8 oz (90.5 kg)    Height:        Intake/Output Summary (Last 24 hours) at 04/30/2017 1129 Last data filed at 04/30/2017 0936 Gross per 24 hour  Intake 1360 ml  Output 900 ml  Net 460 ml   Filed Weights   04/28/17 2027 04/29/17 0535 04/30/17 0430  Weight: 196 lb 9.6 oz (89.2 kg) 197 lb 4.8 oz (89.5 kg) 199 lb 8 oz (90.5 kg)    Telemetry    AV pacing?>> afib w intermittent v pacing- Personally Reviewed  ECG       Physical Exam   GEN: No acute distress.   Neck: JVD  Cm flat Cardiac: RRR, no  murmurs, rubs, or gallops.  Respiratory: Clear to auscultation bilaterally. GI: Soft, nontender, non-distended  MS:  edema; No deformity. Neuro:  Nonfocal  Psych: Normal affect  Skin Warm and dry   Labs    Chemistry Recent Labs  Lab 04/28/17 1107 04/29/17 0429 04/30/17 0736  NA 134* 135 137  K 3.5 3.0* 3.7  CL 93* 95* 104  CO2 23 23 21*  GLUCOSE 418* 197* 203*  BUN 23* 25* 25*  CREATININE 2.02* 2.13* 1.78*  CALCIUM 9.6 8.9 9.4  PROT  --  6.4* 5.9*  ALBUMIN  --  3.2* 2.8*  AST  --  46* 39  ALT  --  91* 66*  ALKPHOS  --  361* 387*  BILITOT  --  1.1 0.9  GFRNONAA 23* 21* 27*  GFRAA 26* 25* 31*  ANIONGAP 18* 17* 12     Hematology Recent Labs  Lab 04/28/17 1107 04/29/17 0429 04/30/17 0736  WBC 10.1 8.1 7.2  RBC 5.17* 4.95 4.78  HGB 14.9 13.9 13.5  HCT 44.5 42.4 41.2  MCV 86.1 85.7 86.2  MCH 28.8 28.1 28.2  MCHC 33.5 32.8 32.8  RDW 15.2 15.1 15.1  PLT 193 192 197    Cardiac Enzymes Recent Labs  Lab 04/28/17 2228 04/29/17 0051 04/29/17 0429 04/29/17 0928  TROPONINI 0.03* 0.03* 0.03* <0.03    Recent Labs  Lab 04/28/17 1128  TROPIPOC 0.00     BNP Recent Labs  Lab 04/25/17 1149  BNP 57.0     DDimer  Recent Labs  Lab 04/28/17 1637  DDIMER <0.27     Radiology    US Abdomen Limited Ruq  Result Date: 04/29/2017 CLINICAL DATA:  Abnormal LFTs.  Previous cholecystectomy. EXAM: ULTRASOUND ABDOMEN LIMITED RIGHT UPPER QUADRANT COMPARISON:  None. FINDINGS:  Gallbladder: Previous cholecystectomy. Common bile duct: Diameter: 8.0 mm. Liver: Findings suggesting mild hepatic steatosis without focal mass. Portal vein is patent on color Doppler imaging with normal direction of blood flow towards the liver. IMPRESSION: Previous cholecystectomy.  No acute findings. Mild hepatic steatosis without focal mass. Electronically Signed   By: Marin Olp M.D.   On: 04/29/2017 13:55    Cardiac Studies   EF normal with mild LVH left atrial size  Device Interrogation        Assessment & Plan    Atrial fibrillation-persistent  Heart failure-chronic-diastolic  Dyspnea on exertion-severe  Renal insufficiency grade 3-4  Hypertensive heart disease  Coronary artery disease prior stenting previously abnormal Myoview  Thyroidism-iatrogenic  Patient is disinclined to have her thyroid adjusted although I suspect she is currently hyperthyroid  We will check an amiodarone level to see whether she might be a candidate for dofetilide  Undertake chest CT scan high resolution to see if we can require further insights as to why she might be short of breath  Repeat catheterization tomorrow or Tuesday depending on her creatinine right and left heart   Signed, Virl Axe, MD  04/30/2017, 11:29 AM

## 2017-05-01 LAB — CBC WITH DIFFERENTIAL/PLATELET
BASOS ABS: 0 10*3/uL (ref 0.0–0.1)
BASOS PCT: 0 %
EOS ABS: 0.3 10*3/uL (ref 0.0–0.7)
Eosinophils Relative: 5 %
HEMATOCRIT: 38.6 % (ref 36.0–46.0)
HEMOGLOBIN: 12.5 g/dL (ref 12.0–15.0)
Lymphocytes Relative: 30 %
Lymphs Abs: 1.8 10*3/uL (ref 0.7–4.0)
MCH: 28.3 pg (ref 26.0–34.0)
MCHC: 32.4 g/dL (ref 30.0–36.0)
MCV: 87.5 fL (ref 78.0–100.0)
Monocytes Absolute: 0.5 10*3/uL (ref 0.1–1.0)
Monocytes Relative: 8 %
NEUTROS ABS: 3.4 10*3/uL (ref 1.7–7.7)
NEUTROS PCT: 57 %
Platelets: 183 10*3/uL (ref 150–400)
RBC: 4.41 MIL/uL (ref 3.87–5.11)
RDW: 15.6 % — ABNORMAL HIGH (ref 11.5–15.5)
WBC: 6 10*3/uL (ref 4.0–10.5)

## 2017-05-01 LAB — COMPREHENSIVE METABOLIC PANEL
ALBUMIN: 2.8 g/dL — AB (ref 3.5–5.0)
ALK PHOS: 365 U/L — AB (ref 38–126)
ALT: 56 U/L — ABNORMAL HIGH (ref 14–54)
ANION GAP: 10 (ref 5–15)
AST: 36 U/L (ref 15–41)
BILIRUBIN TOTAL: 0.5 mg/dL (ref 0.3–1.2)
BUN: 21 mg/dL — AB (ref 6–20)
CALCIUM: 9.3 mg/dL (ref 8.9–10.3)
CO2: 23 mmol/L (ref 22–32)
Chloride: 108 mmol/L (ref 101–111)
Creatinine, Ser: 1.61 mg/dL — ABNORMAL HIGH (ref 0.44–1.00)
GFR calc Af Amer: 35 mL/min — ABNORMAL LOW (ref >60)
GFR calc non Af Amer: 30 mL/min — ABNORMAL LOW (ref >60)
GLUCOSE: 171 mg/dL — AB (ref 65–99)
Potassium: 4.1 mmol/L (ref 3.5–5.1)
SODIUM: 141 mmol/L (ref 135–145)
TOTAL PROTEIN: 5.7 g/dL — AB (ref 6.5–8.1)

## 2017-05-01 LAB — GLUCOSE, CAPILLARY
GLUCOSE-CAPILLARY: 253 mg/dL — AB (ref 65–99)
GLUCOSE-CAPILLARY: 110 mg/dL — AB (ref 65–99)
Glucose-Capillary: 177 mg/dL — ABNORMAL HIGH (ref 65–99)

## 2017-05-01 LAB — PHOSPHORUS: Phosphorus: 3.5 mg/dL (ref 2.5–4.6)

## 2017-05-01 LAB — MAGNESIUM: Magnesium: 2 mg/dL (ref 1.7–2.4)

## 2017-05-01 MED ORDER — INSULIN ASPART 100 UNIT/ML ~~LOC~~ SOLN
4.0000 [IU] | Freq: Three times a day (TID) | SUBCUTANEOUS | Status: DC
  Administered 2017-05-01 – 2017-05-04 (×7): 4 [IU] via SUBCUTANEOUS

## 2017-05-01 NOTE — Progress Notes (Signed)
Inpatient Diabetes Program Recommendations  AACE/ADA: New Consensus Statement on Inpatient Glycemic Control (2015)  Target Ranges:  Prepandial:   less than 140 mg/dL      Peak postprandial:   less than 180 mg/dL (1-2 hours)      Critically ill patients:  140 - 180 mg/dL   Lab Results  Component Value Date   GLUCAP 253 (H) 05/01/2017   HGBA1C 9.4 (H) 04/28/2017    Review of Glycemic Control  Diabetes history:DM2 Outpatient Diabetes medications:Jardiance 10 mg QD, metformin 500 mg bid Current orders for Inpatient glycemic control:Lantus 20 units QHS, Novolog 0-15 units tidwc, Novolog 4 units TIDAC,  HgbA1C - 9.4% - uncontrolled Pt was previously on Amaryl. Started Jardiance on 04/26/2017  Spoke with patient regarding home regimen prior to hospitalization. Patient explains that she was in the process of intermittent fasting, 3-4 weeks prior to hospitalization on MWF for a full 24 hours without eating anything in an attempt to lose weight. Confirmed that she was taking Jardiance and Metformin, but with both oral agents the course was not consistent. She explains that there were days she wasn't taking the Metformin due to GI upset. While on Amaryl, patient experiencing low BS, most likely due to poor oral intake during the fasting days. I would not recommend Amaryl as outpatient.   Patient is very clear that upon discharge she prefers oral medications. Has been on insulin the past and wants to try to initiate care with an endocrinologist in [place] before starting insulin. Her main concern is weight gain. Education provided to patient on the pancreas, the changes that occur with beta cells and reasons why weight gain occurs once insulin is started. Discussed long term goals and POC that involved the need for sugar to get into the cells. We discussed Jardiance, SGLT-2 inhibitors and the side effects associated with dehydration and DKA. During the discussion patient was educated on using  ketone strips, in the event that this is the mode of diabetes treatment at discharge. Also, discussed Metformin at length considering she has had GI upset in the past. Encouraged a regimen that she could be consistent with. Explained that during hospitalization and stress may cause increases to blood sugars.  Reviewed patient's current A1c of 9.4%. Explained what a A1c is and what it measures. Also reviewed goal A1c with patient, importance of good glucose control @ home, and blood sugar goals. Patient currently checking blood sugars once/day. Encouraged to begin checking 2/day, once in the AM and then following a meal. Patient has a meter and test strips.    We discussed OP diabetes education. Patient is not interested. Has been within the last few years and feels this is unnecessary. Plan to provide information regarding local offices for endocrinology. Will continue to watch to determine plan and discharge needs.  Thanks, Bronson Curb, MSN, RNC-OB Diabetes Coordinator [phone] (8a-5p)

## 2017-05-01 NOTE — Progress Notes (Signed)
Inpatient Diabetes Program Recommendations  AACE/ADA: New Consensus Statement on Inpatient Glycemic Control (2015)  Target Ranges:  Prepandial:   less than 140 mg/dL      Peak postprandial:   less than 180 mg/dL (1-2 hours)      Critically ill patients:  140 - 180 mg/dL   Lab Results  Component Value Date   GLUCAP 177 (H) 05/01/2017   HGBA1C 9.4 (H) 04/28/2017    Review of Glycemic Control Results for LASHEBA, STEVENS (MRN [id number]) as of 05/01/2017 10:12  Ref. Range 04/30/2017 11:17 04/30/2017 16:21 04/30/2017 20:50 05/01/2017 08:04  Glucose-Capillary Latest Ref Range: 65 - 99 mg/dL 304 (H) 183 (H) 242 (H) 177 (H)   Diabetes history: DM2 Outpatient Diabetes medications: Jardiance 10 mg QD, metformin 500 mg bid Current orders for Inpatient glycemic control: Lantus 20 units QHS, Novolog 0-15 units tidwc  HgbA1C - 9.4% - uncontrolled Pt was previously on Amaryl. Started Jardiance on 04/26/2017   Inpatient Diabetes Program Recommendations:    Noted initiation of Novolog 4 Units TIDAC.  Also, consider adding Novolog 0-5 Units QHS.  Will plan to see patient today.  Thanks, Bronson Curb, MSN, RNC-OB Diabetes Coordinator [phone] (8a-5p)<

## 2017-05-01 NOTE — Progress Notes (Signed)
Progress Note  Patient Name: Dawn Garcia Date of Encounter: 05/01/2017  Primary Cardiologist: TG  Primary Electrophysiologist: SK/WC   Patient Profile     38F with remote stenting (date/vessel unknown), PAF with reported ablation previously, SSS s/p PPM, COPD, DM, HLD, CKD III, hypothyroidism (thyroid removal). Previously on dronedarone but stopped due to diarrhea. She had been started on amiodarone 03/2017 for recurrent AF stopped on 2/14.  Came into the hospital because of severe shortness of breath.   Subjective   States that she feels improvement in her breathing since admission.  Denies chest pain although had chest discomfort this morning.  Inpatient Medications    Scheduled Meds: . apixaban  5 mg Oral BID  . B-complex with vitamin C  1 tablet Oral Daily  . cholecalciferol  5,000 Units Oral q morning - 10a  . docusate sodium  100 mg Oral Daily  . guaiFENesin  600 mg Oral BID  . insulin aspart  0-15 Units Subcutaneous TID WC  . insulin aspart  4 Units Subcutaneous TID WC  . insulin glargine  20 Units Subcutaneous QHS  . levothyroxine  150 mcg Oral QAC breakfast  . pantoprazole  40 mg Oral Daily  . rosuvastatin  40 mg Oral Daily  . vitamin B-12  500 mcg Oral Daily   Continuous Infusions:  PRN Meds: acetaminophen **OR** acetaminophen, diclofenac sodium, levalbuterol, nitroGLYCERIN, ondansetron **OR** ondansetron (ZOFRAN) IV, polyethylene glycol, traMADol   Vital Signs    Vitals:   04/30/17 0430 04/30/17 1156 04/30/17 2045 05/01/17 0646  BP: 110/63 (!) 146/73 129/62 (!) 136/58  Pulse: 70 69 64 60  Resp: _0 Temp: 97.6 F (36.4 C) 98.7 F (37.1 C) 98.3 F (36.8 C) (!) 97.3 F (36.3 C)  TempSrc: Oral Oral Oral Oral  SpO2: 96% 95% 98% 100%  Weight: 90.5 kg (199 lb 8 oz)   91.9 kg (202 lb 11.2 oz)  Height:        Intake/Output Summary (Last 24 hours) at 05/01/2017 0857 Last data filed at 05/01/2017 0647 Gross per 24 hour  Intake 1296.67 ml    Output 2300 ml  Net -1003.33 ml   Filed Weights   04/29/17 0535 04/30/17 0430 05/01/17 0646  Weight: 89.5 kg (197 lb 4.8 oz) 90.5 kg (199 lb 8 oz) 91.9 kg (202 lb 11.2 oz)    Telemetry    AV pacing?>> afib w intermittent v pacing- Personally Reviewed  ECG       Physical Exam   GEN: No acute distress.   Neck: JVD  Cm flat Cardiac: RRR, no  murmurs, rubs, or gallops.  Respiratory: Clear to auscultation bilaterally. GI: Soft, nontender, non-distended  MS:  edema; No deformity. Neuro:  Nonfocal  Psych: Normal affect  Skin Warm and dry   Labs    Chemistry Recent Labs  Lab 04/29/17 0429 04/30/17 0736 05/01/17 0559  NA 135 137 141  K 3.0* 3.7 4.1  CL 95* 104 108  CO2 23 21* 23  GLUCOSE 197* 203* 171*  BUN 25* 25* 21*  CREATININE 2.13* 1.78* 1.61*  CALCIUM 8.9 9.4 9.3  PROT 6.4* 5.9* 5.7*  ALBUMIN 3.2* 2.8* 2.8*  AST 46* 39 36  ALT 91* 66* 56*  ALKPHOS 361* 387* 365*  BILITOT 1.1 0.9 0.5  GFRNONAA 21* 27* 30*  GFRAA 25* 31* 35*  ANIONGAP 17* 12 10     Hematology Recent Labs  Lab 04/29/17 0429 04/30/17 0736 05/01/17 0559  WBC 8.1 7.2  6.0  RBC 4.95 4.78 4.41  HGB 13.9 13.5 12.5  HCT 42.4 41.2 38.6  MCV 85.7 86.2 87.5  MCH 28.1 28.2 28.3  MCHC 32.8 32.8 32.4  RDW 15.1 15.1 15.6*  PLT 192 197 183    Cardiac Enzymes Recent Labs  Lab 04/28/17 2228 04/29/17 0051 04/29/17 0429 04/29/17 0928  TROPONINI 0.03* 0.03* 0.03* <0.03    Recent Labs  Lab 04/28/17 1128  TROPIPOC 0.00     BNP Recent Labs  Lab 04/25/17 1149  BNP 57.0     DDimer  Recent Labs  Lab 04/28/17 1637  DDIMER <0.27     Radiology    Ct Chest High Resolution  Result Date: 04/30/2017 CLINICAL DATA:  Dyspnea.  Inpatient. EXAM: CT CHEST WITHOUT CONTRAST TECHNIQUE: Multidetector CT imaging of the chest was performed following the standard protocol without intravenous contrast. High resolution imaging of the lungs, as well as inspiratory and expiratory imaging, was  performed. COMPARISON:  04/27/2017 chest radiograph.  12/23/2015 chest CT. FINDINGS: Cardiovascular: Stable top-normal heart size. Stable 2 lead left subclavian pacemaker with lead tips in the right atrium and right ventricular apex. Stable trace pericardial effusion/thickening. Three-vessel coronary atherosclerosis. Atherosclerotic nonaneurysmal thoracic aorta. Stable borderline prominent main pulmonary artery (3.3 cm diameter). Mediastinum/Nodes: Apparent thyroidectomy. Unremarkable esophagus. No pathologically enlarged axillary, mediastinal or gross hilar lymph nodes, noting limited sensitivity for the detection of hilar adenopathy on this noncontrast study. Lungs/Pleura: No pneumothorax. No pleural effusion. No acute consolidative airspace disease, lung masses or significant pulmonary nodules. Stable small parenchymal bands in the medial segment right middle lobe, dependent left lower lobe and medial lower lobes bilaterally, compatible with mild postinfectious/postinflammatory scarring. No significant regions of subpleural reticulation, ground-glass attenuation, traction bronchiectasis, architectural distortion or frank honeycombing. No significant air trapping on the expiration sequence. Upper abdomen: No acute abnormality. Musculoskeletal: No aggressive appearing focal osseous lesions. Moderate to marked thoracic spondylosis. Partially visualized right total shoulder arthroplasty. IMPRESSION: 1. No acute cardiopulmonary disease. No evidence of interstitial lung disease. 2. Mild postinfectious/postinflammatory scarring in the mid to lower lungs, stable. 3. Three-vessel coronary atherosclerosis. 4. Stable borderline prominent main pulmonary artery. Aortic Atherosclerosis (ICD10-I70.0). Electronically Signed   By: Ilona Sorrel M.D.   On: 04/30/2017 16:02   US Abdomen Limited Ruq  Result Date: 04/29/2017 CLINICAL DATA:  Abnormal LFTs.  Previous cholecystectomy. EXAM: ULTRASOUND ABDOMEN LIMITED RIGHT UPPER  QUADRANT COMPARISON:  None. FINDINGS: Gallbladder: Previous cholecystectomy. Common bile duct: Diameter: 8.0 mm. Liver: Findings suggesting mild hepatic steatosis without focal mass. Portal vein is patent on color Doppler imaging with normal direction of blood flow towards the liver. IMPRESSION: Previous cholecystectomy.  No acute findings. Mild hepatic steatosis without focal mass. Electronically Signed   By: Marin Olp M.D.   On: 04/29/2017 13:55    Cardiac Studies   EF normal with mild LVH left atrial size  Device Interrogation      Assessment & Plan    Atrial fibrillation-persistent Heart failure-chronic-diastolic Dyspnea on exertion-severe Renal insufficiency grade 3-4 Hypertensive heart disease Coronary artery disease prior stenting previously abnormal Myoview Thyroidism-iatrogenic  Paroxysmal Atrial Fibrillation On admission device was interrogated and showed afib.  She is currently not in a.fib. Was recently on amiodarone for 1.5 months and stopped less than a week ago.  Amiodarone level is being checked to see if she might be a candidate for dofetilide. CHA2DS2-VASc is 4, currently on Eliquis  Coronary artery disease Stented in 2014.  Troponin negative x 3.  Patient has been having on  and off chest discomfort with her shortness of breath.  Plan is for right and left heart catheterization when creatinine continues to improve.   Hypothyroid TSH this admission is 0.198 and has previously been low in the past.  Likely hyperthyroid.  Currently on 148mg of levothyroxine.  Patient hesitant in adjusting medications.    Dyspnea on exertion D-dimer negative.  Echo with LV EF: 595-62% grade 1 diastolic dysfunction.  Overall no significant change from previous study in 2018.  Patient had a chest CT scan high resolution with no acute cardiopulmonary disease, with postinflammatory scarring in the mid to lower lungs and 3 vessel coronary arthrosclerosis noted.   CKD III Creatinine 1.6,  improved.  Creatinine rose to 2.02 this admission.  Creatinine was 1.2 on 02/2017, current creatinine likely has more room to come down.    Abnormal liver enzymes Patient had a RUQ [place]ith findings suggesting mild hepatic steatosis without focal mass. AST and ALT improving, ALk phos still elevated at 365.  Sick sinus syndrome Post Saint Jude dual-chamber pacemaker. Device functioning appropriately. No changes.  Signed, JBoyd Kerbs DO  05/01/2017, 8:57 AM    Attending Note:   The patient was seen and examined.  Agree with assessment and plan as noted above.  Changes made to the above note as needed.  Patient seen and independently examined with JKalman Shan DO.   We discussed all aspects of the encounter. I agree with the assessment and plan as stated above.  1.  New onset diastolic congestive heart failure: The patient presents with worsening shortness of breath.  She has well-preserved left ventricular systolic function and grade 1 diastolic dysfunction.  She was started on Lasix and then  Torsemide but her symptoms did not significant improve.  She was admitted for further evaluation. She developed some mild to moderate renal insufficiency. Seems to be slowly improving since were holding her diuretic.  Our plans are for right and left heart catheterization once her renal function improves.  2.  Paroxysmal atrial for ablation: The patient currently is AV pacing.  She seems to be feeling better. The patient has been on amiodarone and this is been discontinued.  The plan is to allow the amiodarone to washout and consider dofetilide.   I have spent a total of 40 minutes with patient reviewing hospital  notes , telemetry, EKGs, labs and examining patient as well as establishing an assessment and plan that was discussed with the patient. > 50% of time was spent in direct patient care.    PThayer Headings JBrooke Bonito, MD, FT[facility]2/18/2019, 9:45 AM [address]  SWartracePager [phone]

## 2017-05-01 NOTE — Progress Notes (Signed)
Physical Therapy Treatment Patient Details Name: Dawn Garcia MRN: [id number] DOB: 07-11-1940 Today's Date: 05/01/2017    History of Present Illness Pt is a 77 y.o. female admitted secondary to SOB and weakness. PMH including but not limited to a-fib, CAD, CHF, COPD, DM, hx of MI and pacemaker placement in 2017.    PT Comments    Pt is showing progress towards goals and increasing ambulation distance. She continues to be limited by fatigue and SOB. Pt reaching for support from railing as she fatigues. Will continue to follow acutely to maximize pt's activity tolerance and safety with mobility.    Follow Up Recommendations  Home health PT     Equipment Recommendations  None recommended by PT    Recommendations for Other Services       Precautions / Restrictions Precautions Precautions: Fall Restrictions Weight Bearing Restrictions: No    Mobility  Bed Mobility Overal bed mobility: Modified Independent                Transfers Overall transfer level: Needs assistance Equipment used: None Transfers: Sit to/from Stand Sit to Stand: Supervision         General transfer comment: supervision for safety  Ambulation/Gait Ambulation/Gait assistance: Supervision Ambulation Distance (Feet): 100 Feet Assistive device: None Gait Pattern/deviations: Step-through pattern;Decreased stride length Gait velocity: decreased Gait velocity interpretation: Below normal speed for age/gender General Gait Details: Pt fatigues quickly and began reaching for railing for support during ambulation. Overall steady gait.   Stairs            Wheelchair Mobility    Modified Rankin (Stroke Patients Only)       Balance Overall balance assessment: Needs assistance Sitting-balance support: Feet supported Sitting balance-Leahy Scale: Good     Standing balance support: During functional activity;No upper extremity supported Standing balance-Leahy Scale: Fair                               Cognition Arousal/Alertness: Awake/alert Behavior During Therapy: WFL for tasks assessed/performed Overall Cognitive Status: Within Functional Limits for tasks assessed                                        Exercises      General Comments        Pertinent Vitals/Pain Pain Assessment: No/denies pain    Home Living                      Prior Function            PT Goals (current goals can now be found in the care plan section) Acute Rehab PT Goals Patient Stated Goal: build up activity tolerance PT Goal Formulation: With patient/family Time For Goal Achievement: 05/13/17 Potential to Achieve Goals: Good Progress towards PT goals: Progressing toward goals    Frequency    Min 3X/week      PT Plan Current plan remains appropriate    Co-evaluation              AM-PAC PT "6 Clicks" Daily Activity  Outcome Measure  Difficulty turning over in bed (including adjusting bedclothes, sheets and blankets)?: None Difficulty moving from lying on back to sitting on the side of the bed? : None Difficulty sitting down on and standing up from a chair with arms (e.g., wheelchair, bedside commode,  etc,.)?: None Help needed moving to and from a bed to chair (including a wheelchair)?: None Help needed walking in hospital room?: A Little Help needed climbing 3-5 steps with a railing? : A Little 6 Click Score: 22    End of Session Equipment Utilized During Treatment: Gait belt Activity Tolerance: Patient limited by fatigue Patient left: with call bell/phone within reach;with family/visitor present;in chair(sitting EOB) Nurse Communication: Mobility status PT Visit Diagnosis: Other abnormalities of gait and mobility (R26.89)     Time: 0950-1004 PT Time Calculation (min) (ACUTE ONLY): 14 min  Charges:  $Gait Training: 8-22 mins                    G Codes:       Benjiman Core, [place] Pager [phone] Acute  Rehab   Allena Katz 05/01/2017, 10:12 AM

## 2017-05-01 NOTE — Progress Notes (Signed)
PROGRESS NOTE    Dawn Garcia  NAT:[id number] DOB: September 13, 1940 DOA: 04/28/2017 PCP: Glean Hess, MD   Brief Narrative:  Dawn Garcia is a 77 y.o. 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 States she feels like this when she goes into Atrial Fibrillation and has had an ablation in the past. Was recently evaluated by EP Cardiology by Dr. Caryl Comes yesterday and Dr. Curt Bears today and was sent to the ED for evaluation given concern that SOB maybe something else besides PAF. She endorses no leg swelling and states she was recently taken off of Amiodarone 2 days ago. Has been having intermittent Chest Pressure that is left side associated with this SOB. Admits to being Lightheaded and sometimes dizzy with these episodes and also has tremendous fatigue and weakness. No Nausea or vomting. [facility] was called to evaluate and admit patient for Acute Dyspnea/Dyspnea on Exertion and CP r/o ACS. Patient's Troponin is stable and ECHO has not changed. Patient's SOB is improved. High Resolution CT Chest done and showed no acute cardiopulmonary disease but did show mild postinfectious/postinflammatory scarring in the mid to lower lungs and three-vessel coronary atherosclerosis. Will continue to hold diuresis today and encourage po Hydration. Plan is for Left and Right Heart Catheterization once Renal Fxn improves.   Assessment & Plan:   Active Problems:   Paroxysmal atrial fibrillation (HCC)   Obesity, Class II, BMI 35-39.9   GERD with stricture   Diastolic CHF (HCC)   CKD (chronic kidney disease) stage 3, GFR 30-59 ml/min (HCC)   COPD (chronic obstructive pulmonary disease) (HCC)   Hypothyroidism following radioiodine therapy   Coronary artery disease   Hyperlipidemia   Chest pain   SSS (sick  sinus syndrome) ([place])   Uncontrolled type 2 diabetes mellitus with hyperglycemia, without long-term current use of insulin (HCC)   OSA (obstructive sleep apnea)  Acute Dyspnea/Dyspnea on Exertion, improved -Progressively worsening the last few weeks; ? Related to PAF vs. COPD (unlikley as patient wasn't wheezing) -Checked Patient for Respiratory Virus Panel and Flu and was Negative.  -Cardiology Checkied D-Dimer and was <0.27; Will not get V/Q Scan -Cardiology ordered High Resolution CT to evaluate SOB -High Resolution CT Scan showed no acute Cardiopulmonary Disease. No evidence of interstitial lung disease, mild postinfectious/postinflammatory scarring in the mid to lower lungs which was stable, three-vessel coronary atherosclerosis, and stable borderline prominent main pulmonary artery.  -Xopenex PRN and added Mucinex -Checked ECHOCardiogram as below -CXR done 04/27/16 showed Scarring left base. No edema or consolidation. Heart size normal. Pacemaker leads attached to right atrium and right ventricle. Aortic atherosclerosis present. No adenopathy evident -PT/OT Recommending Home Health PT -Per Cardiology planning Left and Right Heart Catheterization once Renal Function improves more  Chest Pain r/o ACS in a patient with CAD and Hx of MI -Has been having intermittent CP, but improved. ? Related to A Fib.  -C/w NTG 0.4 mg SL q49mnPRN -Cycle Troponin x3 and were Flat at 0.03 x3 -Repeat EKG Sinus -C/w Telemetry -Obtained Echocardiogram and showed EF of 50-55% with Grade 1 DD -Cardiology Evaluating and considering Ischemic Assessment with Cardiac Catheterization but are unsure at this point as it is contingent on Cr; Dr. HDebara Pickettat this point feels she has no evidence of coronary ischemia. EP to evaluate and deferring whether she needs Cardiac Catheterization.  -Cardiology recommending  repeating Left and Right Cardiac Catheterization pending improvement of Renal Function   Paroxysmal Atrial  Fibrillation -Was found to be in Atrial Fibrillation when Device was interrogated as an outpatient and spontaneously converted to NSR in the ED -C/w Anticoagulation with Eliquis -C/w Telemetry -Recently stopped Amiodarone given ineffectiveness of keeping NSR; Cardiology  checking an Amiodarone Level to see whether she maybe a candidate for Dofetilide -Cardiology Consulted and appreciate further recommendations; EP to evaluate -Medical Cardiology feels she is in Sinus and is currently AV Pacing  OSA -C/w Home CPAP  AKI on CKD Stage 3, improving  -Upon Further review Baseline Cr appears to be around 1.1-1.3 -Patient Took her Home Torsemide this AM and likley is Dehydrated based on Labs -Avoid Nephrotoxic Medications if Possible -BUN/Cr has worsened and went from 21/1.60 -> 23/2.02 -> 25/2.13 -> 25/1.78 -> 21/1.61 -Gentle IVF Rehydration with NS at 50 mL/hr x 15 hours stopped as Cr is improving and avoiding Volume Overload -Discussed with Cardiology and recommending no more IVF and encouraging po hydration -Continue to Monitor and Repeat CMP in AM   Uncontrolled Hyperglycemia in the Setting of Diabetes Mellitus Type 2 -Hold Jardiance and Metformin and encourage Discontinuation at D/C -Checked HbA1c and was 9.4; Last one in September 18 was 7.8 -Started Lantus 10 units qHS and increased to 15 units yesterday; Will further increase to 20 units sq Daily and C/w Moderate Novolog SSI AC -Added 4 units TIDwm if Patient eats >50% of her meals  -Appreciate Diabetic Education Coordinator Recommendations -Continue to Monitor CBG's; CBG's ranging from 177-253  Hypothyroidism -S/p Radioactive Thyroid Ablation  -Patient recently had Thyroid studies 2 days ago which showed patient had a TSH of 0.198 (low), T3 of 1.8 (low), and Free T4 of 3.54 (high) -Upon review of patient's Medication list, patient unfortunately has been taking Biotin 1000 mcg po Daily which can alter Thyroid Assay results and can  falsely elevate T4 and in some cases make TSH look lower in lab settings  -Patient has also been taking Amiodarone (recently stopped 2 days ago)  which decreases Free T3 Conversion from Free T4 -Patient's Thyroid Studies therefore are inaccurate and patient needs to be off of Biotin at least 3 days prior to Rechecking TSH, Free T3, and Free T4 -Will not check again this hospitalization and will continue her Levothyroxine 150 mcg po Daily  -Case was discussed with Dr. Kerr of Endocrinology and Patient to follow up with Dr. Kerr at D/C and he will check her labs and adjust medications as necessary  Chronic Diastolic CHF -Currently not Decompensated -Actually looks on the drier side still -Strict I's/O's, Daily Weights -Holding Torsemide for now given AKI on CKD;  -IVF Rehydration stopped  -Patient is -1.283 L  Dyslipidemia/HLD -C/w Rosuvastatin 40 mg po Daily   SSS s/p PPM -C/w Telemetry -Cardiology Following and appreciate further recommendations   COPD -Currently do not feel she is in Exacerbation -C/w prn Xopenex Nebs -Cardiology checking a High Resolution CT Scan today   Hypokalemia -Patient's K+ was 3.0 and improved to 4.1 -Continue to Monitor and Replete as Necessary -Repeat CMP in AM  Abnormal LFT's -? Related to Hypoperfusion in the Setting of A Fib. -AST was 46 and ALT was 91; Repeat AST this AM was 36, and ALT was 56 -Obtained RUQ U/S and showed previous cholecystectomy and no acute findings. There was mild hepatic steatosis without focal mass -Checked Acute Hepatitis Panel and was Negative   -Repeat CMP in AM  Alk   Phos -Alk Phos went from 387 -> 365 -Repeat CMP in AM   DVT prophylaxis: Anticoagulated with Apixaban Code Status: FULL CODE Family Communication: No family present at bedside  Disposition Plan: D/C Home with Home Health when Cleared by Cardiology  Consultants:  Cardiology   Procedures:   ECHOCARDIOGRAM ------------------------------------------------------------------- Study Conclusions  - Procedure narrative: Transthoracic echocardiography. Technically   difficult study with reduced echo windows. - Left ventricle: The cavity size was normal. Wall thickness was   increased in a pattern of mild LVH. Systolic function was normal.   The estimated ejection fraction was in the range of 50% to 55%.   Doppler parameters are consistent with abnormal left ventricular   relaxation (grade 1 diastolic dysfunction). The E/e&' ratio is   between 8-15, suggesting indeterminate LV filling pressure. - Aortic valve: Trileaflet. Sclerosis without stenosis. There was   trivial regurgitation. - Mitral valve: Mildly thickened leaflets . There was trivial   regurgitation. - Left atrium: The atrium was normal in size. - Inferior vena cava: The vessel was normal in size. The   respirophasic diameter changes were in the normal range (>= 50%),   consistent with normal central venous pressure.  Impressions:  - Compared to a prior study in 10/2016, there has been no   significant change.   Antimicrobials:  Anti-infectives (From admission, onward)   None     Subjective: Seen and examined at bedside and felt much better breathing wise. No CP or SOB. No lightheadedness or dizziness.  Objective: Vitals:   04/30/17 1156 04/30/17 2045 05/01/17 0646 05/01/17 1130  BP: (!) 146/73 129/62 (!) 136/58 (!) 147/74  Pulse: 69 64 60 80  Resp: _0 Temp: 98.7 F (37.1 C) 98.3 F (36.8 C) (!) 97.3 F (36.3 C) 98.4 F (36.9 C)  TempSrc: Oral Oral Oral Oral  SpO2: 95% 98% 100% 99%  Weight:   91.9 kg (202 lb 11.2 oz)   Height:        Intake/Output Summary (Last 24 hours) at 05/01/2017 1354 Last data filed at 05/01/2017 7356 Gross per 24 hour  Intake 816.67 ml  Output 1500 ml  Net -683.33 ml   Filed Weights   04/29/17 0535 04/30/17 0430 05/01/17 0646  Weight: 89.5 kg (197 lb 4.8  oz) 90.5 kg (199 lb 8 oz) 91.9 kg (202 lb 11.2 oz)   Examination: Physical Exam:  Constitutional: WN/WD obese Caucasian female in NAD; Appears calm sitting in chair  Eyes: Sclerae anicteric. Lids normal ENMT: External Ears and Nose appear normal. Grossly normal hearing Neck: Supple with no JVD Respiratory: Diminished to auscultation but unlabored breathing; No appreciable wheezing/rales/rhonchi. Cardiovascular: RRR. No appreciable m/r/g. No LE edema  Abdomen: Soft, NT, Distended due to body habitus; Bowel sounds present  GU: Deferred Musculoskeletal: No contractures; No cyanosis Skin: Warm and Dry; No appreciable rashes or lesions on a limited skin eval Neurologic: CN 2-12 grossly intact; No appreciable focal deficits Psychiatric: Normal mood and pleasant affect. Intact judgement and insight  Data Reviewed: I have personally reviewed following labs and imaging studies  CBC: Recent Labs  Lab 04/28/17 1107 04/29/17 0429 04/30/17 0736 05/01/17 0559  WBC 10.1 8.1 7.2 6.0  NEUTROABS  --  5.2 4.6 3.4  HGB 14.9 13.9 13.5 12.5  HCT 44.5 42.4 41.2 38.6  MCV 86.1 85.7 86.2 87.5  PLT 193 192 197 701   Basic Metabolic Panel: Recent Labs  Lab 04/26/17 1642 04/28/17 1107 04/29/17 0429 04/30/17 0736 05/01/17 0559  NA 141 134* 135 137 141  K 4.3 3.5 3.0* 3.7 4.1  CL 94* 93* 95* 104 108  CO2 _0 21* 23  GLUCOSE 310* 418* 197* 203* 171*  BUN 19 23* 25* 25* 21*  CREATININE 1.69* 2.02* 2.13* 1.78* 1.61*  CALCIUM 9.6 9.6 8.9 9.4 9.3  MG  --   --  2.2 2.2 2.0  PHOS  --   --  4.2 3.4 3.5   GFR: Estimated Creatinine Clearance: 32 mL/min (A) (by C-G formula based on SCr of 1.61 mg/dL (H)). Liver Function Tests: Recent Labs  Lab 04/29/17 0429 04/30/17 0736 05/01/17 0559  AST 46* 39 36  ALT 91* 66* 56*  ALKPHOS 361* 387* 365*  BILITOT 1.1 0.9 0.5  PROT 6.4* 5.9* 5.7*  ALBUMIN 3.2* 2.8* 2.8*   No results for input(s): LIPASE, AMYLASE in the last 168 hours. No results for  input(s): AMMONIA in the last 168 hours. Coagulation Profile: No results for input(s): INR, PROTIME in the last 168 hours. Cardiac Enzymes: Recent Labs  Lab 04/28/17 1949 04/28/17 2228 04/29/17 0051 04/29/17 0429 04/29/17 0928  TROPONINI <0.03 0.03* 0.03* 0.03* <0.03   BNP (last 3 results) No results for input(s): PROBNP in the last 8760 hours. HbA1C: Recent Labs    04/28/17 1949  HGBA1C 9.4*   CBG: Recent Labs  Lab 04/30/17 1117 04/30/17 1621 04/30/17 2050 05/01/17 0804 05/01/17 1124  GLUCAP 304* 183* 242* 177* 253*   Lipid Profile: No results for input(s): CHOL, HDL, LDLCALC, TRIG, CHOLHDL, LDLDIRECT in the last 72 hours. Thyroid Function Tests: No results for input(s): TSH, T4TOTAL, FREET4, T3FREE, THYROIDAB in the last 72 hours. Anemia Panel: No results for input(s): VITAMINB12, FOLATE, FERRITIN, TIBC, IRON, RETICCTPCT in the last 72 hours. Sepsis Labs: No results for input(s): PROCALCITON, LATICACIDVEN in the last 168 hours.  Recent Results (from the past 240 hour(s))  Respiratory Panel by PCR     Status: None   Collection Time: 04/28/17  6:25 PM  Result Value Ref Range Status   Adenovirus NOT DETECTED NOT DETECTED Final   Coronavirus 229E NOT DETECTED NOT DETECTED Final   Coronavirus HKU1 NOT DETECTED NOT DETECTED Final   Coronavirus NL63 NOT DETECTED NOT DETECTED Final   Coronavirus OC43 NOT DETECTED NOT DETECTED Final   Metapneumovirus NOT DETECTED NOT DETECTED Final   Rhinovirus / Enterovirus NOT DETECTED NOT DETECTED Final   Influenza A NOT DETECTED NOT DETECTED Final   Influenza B NOT DETECTED NOT DETECTED Final   Parainfluenza Virus 1 NOT DETECTED NOT DETECTED Final   Parainfluenza Virus 2 NOT DETECTED NOT DETECTED Final   Parainfluenza Virus 3 NOT DETECTED NOT DETECTED Final   Parainfluenza Virus 4 NOT DETECTED NOT DETECTED Final   Respiratory Syncytial Virus NOT DETECTED NOT DETECTED Final   Bordetella pertussis NOT DETECTED NOT DETECTED Final    Chlamydophila pneumoniae NOT DETECTED NOT DETECTED Final   Mycoplasma pneumoniae NOT DETECTED NOT DETECTED Final    Comment: Performed at [facility], [address] [place], [place] [address]    Radiology Studies: Ct Chest High Resolution  Result Date: 04/30/2017 CLINICAL DATA:  Dyspnea.  Inpatient. EXAM: CT CHEST WITHOUT CONTRAST TECHNIQUE: Multidetector CT imaging of the chest was performed following the standard protocol without intravenous contrast. High resolution imaging of the lungs, as well as inspiratory and expiratory imaging, was performed. COMPARISON:  04/27/2017 chest radiograph.  12/23/2015 chest CT. FINDINGS: Cardiovascular: Stable top-normal heart size. Stable 2 lead left subclavian pacemaker  with lead tips in the right atrium and right ventricular apex. Stable trace pericardial effusion/thickening. Three-vessel coronary atherosclerosis. Atherosclerotic nonaneurysmal thoracic aorta. Stable borderline prominent main pulmonary artery (3.3 cm diameter). Mediastinum/Nodes: Apparent thyroidectomy. Unremarkable esophagus. No pathologically enlarged axillary, mediastinal or gross hilar lymph nodes, noting limited sensitivity for the detection of hilar adenopathy on this noncontrast study. Lungs/Pleura: No pneumothorax. No pleural effusion. No acute consolidative airspace disease, lung masses or significant pulmonary nodules. Stable small parenchymal bands in the medial segment right middle lobe, dependent left lower lobe and medial lower lobes bilaterally, compatible with mild postinfectious/postinflammatory scarring. No significant regions of subpleural reticulation, ground-glass attenuation, traction bronchiectasis, architectural distortion or frank honeycombing. No significant air trapping on the expiration sequence. Upper abdomen: No acute abnormality. Musculoskeletal: No aggressive appearing focal osseous lesions. Moderate to marked thoracic spondylosis. Partially visualized right  total shoulder arthroplasty. IMPRESSION: 1. No acute cardiopulmonary disease. No evidence of interstitial lung disease. 2. Mild postinfectious/postinflammatory scarring in the mid to lower lungs, stable. 3. Three-vessel coronary atherosclerosis. 4. Stable borderline prominent main pulmonary artery. Aortic Atherosclerosis (ICD10-I70.0). Electronically Signed   By: Ilona Sorrel M.D.   On: 04/30/2017 16:02   Scheduled Meds: . apixaban  5 mg Oral BID  . B-complex with vitamin C  1 tablet Oral Daily  . cholecalciferol  5,000 Units Oral q morning - 10a  . docusate sodium  100 mg Oral Daily  . guaiFENesin  600 mg Oral BID  . insulin aspart  0-15 Units Subcutaneous TID WC  . insulin aspart  4 Units Subcutaneous TID WC  . insulin glargine  20 Units Subcutaneous QHS  . levothyroxine  150 mcg Oral QAC breakfast  . pantoprazole  40 mg Oral Daily  . rosuvastatin  40 mg Oral Daily  . vitamin B-12  500 mcg Oral Daily   Continuous Infusions:   LOS: 2 days   Kerney Elbe, DO [facility] Pager [phone]  If 7PM-7AM, please contact night-coverage [email or web address] Password [facility] 05/01/2017, 1:54 PM

## 2017-05-02 LAB — CBC WITH DIFFERENTIAL/PLATELET
BASOS PCT: 1 %
Basophils Absolute: 0 10*3/uL (ref 0.0–0.1)
Eosinophils Absolute: 0.3 10*3/uL (ref 0.0–0.7)
Eosinophils Relative: 5 %
HEMATOCRIT: 37 % (ref 36.0–46.0)
Hemoglobin: 12.1 g/dL (ref 12.0–15.0)
Lymphocytes Relative: 28 %
Lymphs Abs: 1.8 10*3/uL (ref 0.7–4.0)
MCH: 28.2 pg (ref 26.0–34.0)
MCHC: 32.7 g/dL (ref 30.0–36.0)
MCV: 86.2 fL (ref 78.0–100.0)
MONO ABS: 0.4 10*3/uL (ref 0.1–1.0)
MONOS PCT: 7 %
NEUTROS ABS: 3.9 10*3/uL (ref 1.7–7.7)
Neutrophils Relative %: 59 %
Platelets: 188 10*3/uL (ref 150–400)
RBC: 4.29 MIL/uL (ref 3.87–5.11)
RDW: 15.3 % (ref 11.5–15.5)
WBC: 6.4 10*3/uL (ref 4.0–10.5)

## 2017-05-02 LAB — COMPREHENSIVE METABOLIC PANEL
ALBUMIN: 2.7 g/dL — AB (ref 3.5–5.0)
ALT: 54 U/L (ref 14–54)
ANION GAP: 9 (ref 5–15)
AST: 39 U/L (ref 15–41)
Alkaline Phosphatase: 378 U/L — ABNORMAL HIGH (ref 38–126)
BILIRUBIN TOTAL: 0.9 mg/dL (ref 0.3–1.2)
BUN: 15 mg/dL (ref 6–20)
CO2: 21 mmol/L — ABNORMAL LOW (ref 22–32)
Calcium: 9.1 mg/dL (ref 8.9–10.3)
Chloride: 108 mmol/L (ref 101–111)
Creatinine, Ser: 1.41 mg/dL — ABNORMAL HIGH (ref 0.44–1.00)
GFR, EST AFRICAN AMERICAN: 41 mL/min — AB (ref >60)
GFR, EST NON AFRICAN AMERICAN: 35 mL/min — AB (ref >60)
GLUCOSE: 138 mg/dL — AB (ref 65–99)
POTASSIUM: 3.5 mmol/L (ref 3.5–5.1)
Sodium: 138 mmol/L (ref 135–145)
TOTAL PROTEIN: 5.8 g/dL — AB (ref 6.5–8.1)

## 2017-05-02 LAB — PROTIME-INR
INR: 1.37
Prothrombin Time: 16.7 seconds — ABNORMAL HIGH (ref 11.4–15.2)

## 2017-05-02 LAB — AMIODARONE LEVEL
Amiodarone Lvl: 0.6 ug/mL — ABNORMAL LOW (ref 1.0–2.5)
N-DESETHYL-AMIODARONE: 0.6 ug/mL — AB (ref 1.0–2.5)

## 2017-05-02 LAB — MAGNESIUM: MAGNESIUM: 1.9 mg/dL (ref 1.7–2.4)

## 2017-05-02 LAB — GLUCOSE, CAPILLARY
GLUCOSE-CAPILLARY: 222 mg/dL — AB (ref 65–99)
GLUCOSE-CAPILLARY: 137 mg/dL — AB (ref 65–99)
GLUCOSE-CAPILLARY: 147 mg/dL — AB (ref 65–99)
GLUCOSE-CAPILLARY: 164 mg/dL — AB (ref 65–99)
Glucose-Capillary: 203 mg/dL — ABNORMAL HIGH (ref 65–99)

## 2017-05-02 LAB — PHOSPHORUS: PHOSPHORUS: 3.8 mg/dL (ref 2.5–4.6)

## 2017-05-02 MED ORDER — ASPIRIN 81 MG PO CHEW
81.0000 mg | CHEWABLE_TABLET | ORAL | Status: AC
  Administered 2017-05-03: 81 mg via ORAL
  Filled 2017-05-02: qty 1

## 2017-05-02 MED ORDER — SODIUM CHLORIDE 0.9% FLUSH: 3.0000 mL | INTRAVENOUS | Status: DC | PRN

## 2017-05-02 MED ORDER — SODIUM CHLORIDE 0.9 % WEIGHT BASED INFUSION: 3.0000 mL/kg/h | INTRAVENOUS | Status: DC

## 2017-05-02 MED ORDER — TORSEMIDE 20 MG PO TABS
40.0000 mg | ORAL_TABLET | Freq: Once | ORAL | Status: AC
  Administered 2017-05-02: 40 mg via ORAL
  Filled 2017-05-02: qty 2

## 2017-05-02 MED ORDER — SODIUM CHLORIDE 0.9 % WEIGHT BASED INFUSION: 1.0000 mL/kg/h | INTRAVENOUS | Status: DC

## 2017-05-02 MED ORDER — POTASSIUM CHLORIDE CRYS ER 20 MEQ PO TBCR
20.0000 meq | EXTENDED_RELEASE_TABLET | Freq: Two times a day (BID) | ORAL | Status: AC
  Administered 2017-05-02 (×2): 20 meq via ORAL
  Filled 2017-05-02 (×2): qty 1

## 2017-05-02 MED ORDER — SODIUM CHLORIDE 0.9% FLUSH
3.0000 mL | Freq: Two times a day (BID) | INTRAVENOUS | Status: DC
  Administered 2017-05-02 – 2017-05-03 (×2): 3 mL via INTRAVENOUS

## 2017-05-02 MED ORDER — SODIUM CHLORIDE 0.9 % IV SOLN: 250.0000 mL | INTRAVENOUS | Status: DC | PRN

## 2017-05-02 NOTE — Progress Notes (Signed)
Progress Note  Patient Name: Dawn Garcia Date of Encounter: 05/02/2017  Primary Cardiologist: Rockey Situ   Subjective   77 year old female with a history of coronary artery disease-status post stenting in the remote past.  She has a history of paroxysmal atrial fibrillation and is status post ablation.  She has a history of sick sinus syndrome, status post pacemaker implantation.  She has had recurrent atrial fibrillation and has been on amiodarone.  She presented with worsening shortness of breath.  She was aggressively diuresed resulted in some acute renal insufficiency.  We have held diuretics for the past couple days.  She notes some wheezing on exam today.  Inpatient Medications    Scheduled Meds: . apixaban  5 mg Oral BID  . B-complex with vitamin C  1 tablet Oral Daily  . cholecalciferol  5,000 Units Oral q morning - 10a  . docusate sodium  100 mg Oral Daily  . guaiFENesin  600 mg Oral BID  . insulin aspart  0-15 Units Subcutaneous TID WC  . insulin aspart  4 Units Subcutaneous TID WC  . insulin glargine  20 Units Subcutaneous QHS  . levothyroxine  150 mcg Oral QAC breakfast  . pantoprazole  40 mg Oral Daily  . rosuvastatin  40 mg Oral Daily  . vitamin B-12  500 mcg Oral Daily   Continuous Infusions:  PRN Meds: acetaminophen **OR** acetaminophen, diclofenac sodium, levalbuterol, nitroGLYCERIN, ondansetron **OR** ondansetron (ZOFRAN) IV, polyethylene glycol, traMADol   Vital Signs    Vitals:   05/01/17 0646 05/01/17 1130 05/01/17 2015 05/02/17 0634  BP: (!) 136/58 (!) 147/74 (!) 103/50 (!) 103/94  Pulse: 60 80 60 68  Resp: 18 20 18 18   Temp: (!) 97.3 F (36.3 C) 98.4 F (36.9 C) 98.2 F (36.8 C) 97.7 F (36.5 C)  TempSrc: Oral Oral Oral Oral  SpO2: 100% 99% 98% 98%  Weight: 202 lb 11.2 oz (91.9 kg)   205 lb 1.6 oz (93 kg)  Height:        Intake/Output Summary (Last 24 hours) at 05/02/2017 1011 Last data filed at 05/02/2017 2774 Gross per 24 hour  Intake  1200 ml  Output 2200 ml  Net -1000 ml   Filed Weights   04/30/17 0430 05/01/17 0646 05/02/17 0634  Weight: 199 lb 8 oz (90.5 kg) 202 lb 11.2 oz (91.9 kg) 205 lb 1.6 oz (93 kg)    Telemetry    NSR with V pacing   - Personally Reviewed  ECG       Physical Exam   GEN:  elderly , obese female  Neck: No JVD Cardiac: RR .  Respiratory:  no wheezing by my exam.  She felt some wheezing earlier  GI: Soft, nontender, non-distended  MS: No edema; No deformity. Neuro:  Nonfocal  Psych: Normal affect   Labs    Chemistry Recent Labs  Lab 04/30/17 0736 05/01/17 0559 05/02/17 0433  NA 137 141 138  K 3.7 4.1 3.5  CL 104 108 108  CO2 21* 23 21*  GLUCOSE 203* 171* 138*  BUN 25* 21* 15  CREATININE 1.78* 1.61* 1.41*  CALCIUM 9.4 9.3 9.1  PROT 5.9* 5.7* 5.8*  ALBUMIN 2.8* 2.8* 2.7*  AST 39 36 39  ALT 66* 56* 54  ALKPHOS 387* 365* 378*  BILITOT 0.9 0.5 0.9  GFRNONAA 27* 30* 35*  GFRAA 31* 35* 41*  ANIONGAP 12 10 9      Hematology Recent Labs  Lab 04/30/17 1287 05/01/17 0559 05/02/17 8676  WBC 7.2 6.0 6.4  RBC 4.78 4.41 4.29  HGB 13.5 12.5 12.1  HCT 41.2 38.6 37.0  MCV 86.2 87.5 86.2  MCH 28.2 28.3 28.2  MCHC 32.8 32.4 32.7  RDW 15.1 15.6* 15.3  PLT 197 183 188    Cardiac Enzymes Recent Labs  Lab 04/28/17 2228 04/29/17 0051 04/29/17 0429 04/29/17 0928  TROPONINI 0.03* 0.03* 0.03* <0.03    Recent Labs  Lab 04/28/17 1128  TROPIPOC 0.00     BNP Recent Labs  Lab 04/25/17 1149  BNP 57.0     DDimer  Recent Labs  Lab 04/28/17 1637  DDIMER <0.27     Radiology    Ct Chest High Resolution  Result Date: 04/30/2017 CLINICAL DATA:  Dyspnea.  Inpatient. EXAM: CT CHEST WITHOUT CONTRAST TECHNIQUE: Multidetector CT imaging of the chest was performed following the standard protocol without intravenous contrast. High resolution imaging of the lungs, as well as inspiratory and expiratory imaging, was performed. COMPARISON:  04/27/2017 chest radiograph.   12/23/2015 chest CT. FINDINGS: Cardiovascular: Stable top-normal heart size. Stable 2 lead left subclavian pacemaker with lead tips in the right atrium and right ventricular apex. Stable trace pericardial effusion/thickening. Three-vessel coronary atherosclerosis. Atherosclerotic nonaneurysmal thoracic aorta. Stable borderline prominent main pulmonary artery (3.3 cm diameter). Mediastinum/Nodes: Apparent thyroidectomy. Unremarkable esophagus. No pathologically enlarged axillary, mediastinal or gross hilar lymph nodes, noting limited sensitivity for the detection of hilar adenopathy on this noncontrast study. Lungs/Pleura: No pneumothorax. No pleural effusion. No acute consolidative airspace disease, lung masses or significant pulmonary nodules. Stable small parenchymal bands in the medial segment right middle lobe, dependent left lower lobe and medial lower lobes bilaterally, compatible with mild postinfectious/postinflammatory scarring. No significant regions of subpleural reticulation, ground-glass attenuation, traction bronchiectasis, architectural distortion or frank honeycombing. No significant air trapping on the expiration sequence. Upper abdomen: No acute abnormality. Musculoskeletal: No aggressive appearing focal osseous lesions. Moderate to marked thoracic spondylosis. Partially visualized right total shoulder arthroplasty. IMPRESSION: 1. No acute cardiopulmonary disease. No evidence of interstitial lung disease. 2. Mild postinfectious/postinflammatory scarring in the mid to lower lungs, stable. 3. Three-vessel coronary atherosclerosis. 4. Stable borderline prominent main pulmonary artery. Aortic Atherosclerosis (ICD10-I70.0). Electronically Signed   By: Ilona Sorrel M.D.   On: 04/30/2017 16:02    Cardiac Studies     Patient Profile     77 y.o. female with a history of coronary artery disease and acute on chronic diastolic congestive heart failure.  She presented with worsening shortness of  breath.  Assessment & Plan    1.  History of recent chest discomfort: She has had worsening shortness of breath over the past several weeks.  Plan is for right and left heart catheterization.  We have been waiting a couple of days to allow her renal function to improve. She is a little more short of breath today we will give her 1 dose of torsemide and continue to follow her basic metabolic profile closely.  2.  Acute on chronic diastolic congestive heart failure: We will restart her diuretics as soon as the heart cath is done.  We aggressively diuresed her several days ago which resulted in acute renal insufficiency.  3.  Paroxysmal atrial fibrillation: Is maintaining NSR   For questions or updates, please contact [place] Please consult [email or web address] for contact info under Cardiology/STEMI.      Signed, Mertie Moores, MD  05/02/2017, 10:11 AM

## 2017-05-02 NOTE — Plan of Care (Signed)
  Completed/Met Education: Knowledge of General Education information will improve 05/02/2017 1244 - Completed/Met by Alonna Buckler, RN Nutrition: Adequate nutrition will be maintained 05/02/2017 1244 - Completed/Met by Alonna Buckler, RN Elimination: Will not experience complications related to bowel motility 05/02/2017 1244 - Completed/Met by Alonna Buckler, RN Will not experience complications related to urinary retention 05/02/2017 1244 - Completed/Met by Alonna Buckler, RN Pain Managment: General experience of comfort will improve 05/02/2017 1244 - Completed/Met by Alonna Buckler, RN Safety: Ability to remain free from injury will improve 05/02/2017 1244 - Completed/Met by Alonna Buckler, RN Education: Understanding of CV disease, CV risk reduction, and recovery process will improve 05/02/2017 1426 - Completed/Met by Alonna Buckler, RN

## 2017-05-02 NOTE — Plan of Care (Signed)
  Completed/Met Education: Knowledge of General Education information will improve 05/02/2017 1244 - Completed/Met by Alonna Buckler, RN Nutrition: Adequate nutrition will be maintained 05/02/2017 1244 - Completed/Met by Alonna Buckler, RN Elimination: Will not experience complications related to bowel motility 05/02/2017 1244 - Completed/Met by Alonna Buckler, RN Will not experience complications related to urinary retention 05/02/2017 1244 - Completed/Met by Alonna Buckler, RN Pain Managment: General experience of comfort will improve 05/02/2017 1244 - Completed/Met by Alonna Buckler, RN Safety: Ability to remain free from injury will improve 05/02/2017 1244 - Completed/Met by Alonna Buckler, RN

## 2017-05-02 NOTE — Progress Notes (Addendum)
Physical Therapy Treatment Patient Details Name: Dawn Garcia MRN: [id number] DOB: [birth year] Today's Date: 05/02/2017    History of Present Illness Pt is a 77 y.o. female admitted secondary to SOB and weakness. PMH including but not limited to a-fib, CAD, CHF, COPD, DM, hx of MI and pacemaker placement in 2017.    PT Comments    Pt performed supine exercises in bed and HEP issued during PT tx this afternoon.  Pt reports having a pre-syncopal episode with nursing when ambulating in halls this am.  RN deferred OOB mobility at this time as they are diuresing large amounts of fluid from her.  Pt reports weakness and fatigue during LE exercises.      Follow Up Recommendations  Home health PT     Equipment Recommendations  None recommended by PT    Recommendations for Other Services       Precautions / Restrictions Precautions Precautions: Fall Restrictions Weight Bearing Restrictions: No    Mobility  Bed Mobility Overal bed mobility: Modified Independent             General bed mobility comments: Pt supine in bed but able to pull herself to [facility] without assistance.    Transfers Overall transfer level: (Pt reports having a pre-syncopal event when walking with nursing and refused OOB based on RN deferral.  Pt was agreeable to supine exercises and HEP issued.  )                 Ambulation/Gait                Stairs            Wheelchair Mobility    Modified Rankin (Stroke Patients Only)       Balance                                            Cognition Arousal/Alertness: Awake/alert Behavior During Therapy: WFL for tasks assessed/performed Overall Cognitive Status: Within Functional Limits for tasks assessed                                        Exercises General Exercises - Lower Extremity Ankle Circles/Pumps: AROM;Both;10 reps;Supine Quad Sets: AROM;Both;10 reps;Supine Gluteal Sets:  AROM;Both;10 reps;Supine Heel Slides: AROM;Both;Supine;10 reps Hip ABduction/ADduction: AROM;Both;10 reps;Supine Straight Leg Raises: AROM;Both;10 reps;Supine    General Comments        Pertinent Vitals/Pain Pain Assessment: 0-10 Pain Location: back Pain Descriptors / Indicators: Aching;Tightness Pain Intervention(s): Monitored during session;Repositioned    Home Living                      Prior Function            PT Goals (current goals can now be found in the care plan section) Acute Rehab PT Goals Patient Stated Goal: build up activity tolerance Potential to Achieve Goals: Good Progress towards PT goals: Progressing toward goals    Frequency    Min 3X/week      PT Plan Current plan remains appropriate    Co-evaluation              AM-PAC PT "6 Clicks" Daily Activity  Outcome Measure  Difficulty turning over in bed (including adjusting bedclothes, sheets and blankets)?: None Difficulty  moving from lying on back to sitting on the side of the bed? : None Difficulty sitting down on and standing up from a chair with arms (e.g., wheelchair, bedside commode, etc,.)?: None Help needed moving to and from a bed to chair (including a wheelchair)?: None Help needed walking in hospital room?: A Little Help needed climbing 3-5 steps with a railing? : A Little 6 Click Score: 22    End of Session Equipment Utilized During Treatment: Gait belt Activity Tolerance: Patient limited by fatigue Patient left: with call bell/phone within reach;with family/visitor present;in chair Nurse Communication: Mobility status PT Visit Diagnosis: Other abnormalities of gait and mobility (R26.89)     Time: 1458-1520 PT Time Calculation (min) (ACUTE ONLY): 22 min  Charges:  $Therapeutic Exercise: 8-22 mins                    G Codes:       Governor Rooks, PTA pager [place] 05/02/2017, 3:49 PM

## 2017-05-02 NOTE — Progress Notes (Signed)
Occupational Therapy Treatment Patient Details Name: Dawn Garcia MRN: [id number] DOB: [birth year] Today's Date: 05/02/2017    History of present illness Pt is a 77 y.o. female admitted secondary to SOB and weakness. PMH including but not limited to a-fib, CAD, CHF, COPD, DM, hx of MI and pacemaker placement in 2017.   OT comments  Pt seen today with focus on pt education during ADL's. Handout issued and reviewed for energy conservation techniques. Pt should benefit from rest breaks and possible a/e if she desires.    Follow Up Recommendations  Home health OT    Equipment Recommendations  Other (comment)(A/E if wanted)    Recommendations for Other Services      Precautions / Restrictions Precautions Precautions: Fall Restrictions Weight Bearing Restrictions: No       Mobility Bed Mobility Overal bed mobility: Modified Independent                Transfers Overall transfer level: Needs assistance Equipment used: None Transfers: Sit to/from Stand;Stand Pivot Transfers Sit to Stand: Supervision Stand pivot transfers: Supervision       General transfer comment: supervision for safety    Balance Overall balance assessment: Needs assistance Sitting-balance support: Feet supported Sitting balance-Leahy Scale: Good     Standing balance support: During functional activity;No upper extremity supported Standing balance-Leahy Scale: Fair                             ADL either performed or assessed with clinical judgement   ADL Overall ADL's : Needs assistance/impaired     Grooming: Wash/dry hands;Wash/dry face;Supervision/safety;Standing Grooming Details (indicate cue type and reason): Discussed sitting for energy conservation             Lower Body Dressing: Supervision/safety;Sit to/from stand   Toilet Transfer: Sales executive;Ambulation Toilet Transfer Details (indicate cue type and reason): VC's for pursed lip  breathing, taking her time etc for energy conservation as pt reports SOB "More today than yesterday" RN/MD in room and are aware of this Toileting- Clothing Manipulation and Hygiene: Sitting/lateral lean;Sit to/from stand;Supervision/safety       Functional mobility during ADLs: Supervision/safety General ADL Comments: Handout issued for Energy Conservation and reviewed with pt. Discussed E.C. during ADL's, taking rest breaks PRN, sitting when able and pursed lip breathing. Pt able to implement after education     Vision Baseline Vision/History: Wears glasses Wears Glasses: At all times Patient Visual Report: No change from baseline     Perception     Praxis      Cognition Arousal/Alertness: Awake/alert Behavior During Therapy: WFL for tasks assessed/performed Overall Cognitive Status: Within Functional Limits for tasks assessed                                          Exercises     Shoulder Instructions       General Comments      Pertinent Vitals/ Pain       Pain Assessment: No/denies pain Pain Score: 0-No pain  Home Living                                          Prior Functioning/Environment              Frequency  Min 2X/week        Progress Toward Goals  OT Goals(current goals can now be found in the care plan section)  Progress towards OT goals: Progressing toward goals  Acute Rehab OT Goals Patient Stated Goal: build up activity tolerance  Plan Discharge plan remains appropriate    Co-evaluation                 AM-PAC PT "6 Clicks" Daily Activity     Outcome Measure   Help from another person eating meals?: None Help from another person taking care of personal grooming?: A Little Help from another person toileting, which includes using toliet, bedpan, or urinal?: A Little Help from another person bathing (including washing, rinsing, drying)?: A Little Help from another person to put on and  taking off regular upper body clothing?: A Little Help from another person to put on and taking off regular lower body clothing?: A Little 6 Click Score: 19    End of Session    OT Visit Diagnosis: Other (comment);Pain(Decreased activitiy tolerance)   Activity Tolerance Patient tolerated treatment well   Patient Left in bed;with call bell/phone within reach;with nursing/sitter in room;with family/visitor present   Nurse Communication          Time: 7062-3762 OT Time Calculation (min): 10 min  Charges: OT General Charges $OT Visit: 1 Visit OT Treatments $Self Care/Home Management : 8-22 mins   Muscab Brenneman Beth Dixon, OTR/L 05/02/2017, 10:34 AM

## 2017-05-02 NOTE — Progress Notes (Signed)
Inpatient Diabetes Program Recommendations  AACE/ADA: New Consensus Statement on Inpatient Glycemic Control (2015)  Target Ranges:  Prepandial:   less than 140 mg/dL      Peak postprandial:   less than 180 mg/dL (1-2 hours)      Critically ill patients:  140 - 180 mg/dL   Lab Results  Component Value Date   GLUCAP 137 (H) 05/02/2017   HGBA1C 9.4 (H) 04/28/2017    Spoke with patient this AM to reiterate plan as discussed yesterday (see note). Provided information on local endocrinologists and patient plans to reach out and make appointment. No further questions at this time.  Thanks, Bronson Curb, MSN, RNC-OB Diabetes Coordinator [phone] (8a-5p)

## 2017-05-02 NOTE — H&P (View-Only) (Signed)
Progress Note  Patient Name: Dawn Garcia Date of Encounter: 05/02/2017  Primary Cardiologist: Rockey Situ   Subjective   77 year old female with a history of coronary artery disease-status post stenting in the remote past.  She has a history of paroxysmal atrial fibrillation and is status post ablation.  She has a history of sick sinus syndrome, status post pacemaker implantation.  She has had recurrent atrial fibrillation and has been on amiodarone.  She presented with worsening shortness of breath.  She was aggressively diuresed resulted in some acute renal insufficiency.  We have held diuretics for the past couple days.  She notes some wheezing on exam today.  Inpatient Medications    Scheduled Meds: . apixaban  5 mg Oral BID  . B-complex with vitamin C  1 tablet Oral Daily  . cholecalciferol  5,000 Units Oral q morning - 10a  . docusate sodium  100 mg Oral Daily  . guaiFENesin  600 mg Oral BID  . insulin aspart  0-15 Units Subcutaneous TID WC  . insulin aspart  4 Units Subcutaneous TID WC  . insulin glargine  20 Units Subcutaneous QHS  . levothyroxine  150 mcg Oral QAC breakfast  . pantoprazole  40 mg Oral Daily  . rosuvastatin  40 mg Oral Daily  . vitamin B-12  500 mcg Oral Daily   Continuous Infusions:  PRN Meds: acetaminophen **OR** acetaminophen, diclofenac sodium, levalbuterol, nitroGLYCERIN, ondansetron **OR** ondansetron (ZOFRAN) IV, polyethylene glycol, traMADol   Vital Signs    Vitals:   05/01/17 0646 05/01/17 1130 05/01/17 2015 05/02/17 0634  BP: (!) 136/58 (!) 147/74 (!) 103/50 (!) 103/94  Pulse: 60 80 60 68  Resp: 18 20 18 18   Temp: (!) 97.3 F (36.3 C) 98.4 F (36.9 C) 98.2 F (36.8 C) 97.7 F (36.5 C)  TempSrc: Oral Oral Oral Oral  SpO2: 100% 99% 98% 98%  Weight: 202 lb 11.2 oz (91.9 kg)   205 lb 1.6 oz (93 kg)  Height:        Intake/Output Summary (Last 24 hours) at 05/02/2017 1011 Last data filed at 05/02/2017 4259 Gross per 24 hour  Intake  1200 ml  Output 2200 ml  Net -1000 ml   Filed Weights   04/30/17 0430 05/01/17 0646 05/02/17 0634  Weight: 199 lb 8 oz (90.5 kg) 202 lb 11.2 oz (91.9 kg) 205 lb 1.6 oz (93 kg)    Telemetry    NSR with V pacing   - Personally Reviewed  ECG       Physical Exam   GEN:  elderly , obese female  Neck: No JVD Cardiac: RR .  Respiratory:  no wheezing by my exam.  She felt some wheezing earlier  GI: Soft, nontender, non-distended  MS: No edema; No deformity. Neuro:  Nonfocal  Psych: Normal affect   Labs    Chemistry Recent Labs  Lab 04/30/17 0736 05/01/17 0559 05/02/17 0433  NA 137 141 138  K 3.7 4.1 3.5  CL 104 108 108  CO2 21* 23 21*  GLUCOSE 203* 171* 138*  BUN 25* 21* 15  CREATININE 1.78* 1.61* 1.41*  CALCIUM 9.4 9.3 9.1  PROT 5.9* 5.7* 5.8*  ALBUMIN 2.8* 2.8* 2.7*  AST 39 36 39  ALT 66* 56* 54  ALKPHOS 387* 365* 378*  BILITOT 0.9 0.5 0.9  GFRNONAA 27* 30* 35*  GFRAA 31* 35* 41*  ANIONGAP 12 10 9      Hematology Recent Labs  Lab 04/30/17 5638 05/01/17 0559 05/02/17 7564  WBC 7.2 6.0 6.4  RBC 4.78 4.41 4.29  HGB 13.5 12.5 12.1  HCT 41.2 38.6 37.0  MCV 86.2 87.5 86.2  MCH 28.2 28.3 28.2  MCHC 32.8 32.4 32.7  RDW 15.1 15.6* 15.3  PLT 197 183 188    Cardiac Enzymes Recent Labs  Lab 04/28/17 2228 04/29/17 0051 04/29/17 0429 04/29/17 0928  TROPONINI 0.03* 0.03* 0.03* <0.03    Recent Labs  Lab 04/28/17 1128  TROPIPOC 0.00     BNP Recent Labs  Lab 04/25/17 1149  BNP 57.0     DDimer  Recent Labs  Lab 04/28/17 1637  DDIMER <0.27     Radiology    Ct Chest High Resolution  Result Date: 04/30/2017 CLINICAL DATA:  Dyspnea.  Inpatient. EXAM: CT CHEST WITHOUT CONTRAST TECHNIQUE: Multidetector CT imaging of the chest was performed following the standard protocol without intravenous contrast. High resolution imaging of the lungs, as well as inspiratory and expiratory imaging, was performed. COMPARISON:  04/27/2017 chest radiograph.   12/23/2015 chest CT. FINDINGS: Cardiovascular: Stable top-normal heart size. Stable 2 lead left subclavian pacemaker with lead tips in the right atrium and right ventricular apex. Stable trace pericardial effusion/thickening. Three-vessel coronary atherosclerosis. Atherosclerotic nonaneurysmal thoracic aorta. Stable borderline prominent main pulmonary artery (3.3 cm diameter). Mediastinum/Nodes: Apparent thyroidectomy. Unremarkable esophagus. No pathologically enlarged axillary, mediastinal or gross hilar lymph nodes, noting limited sensitivity for the detection of hilar adenopathy on this noncontrast study. Lungs/Pleura: No pneumothorax. No pleural effusion. No acute consolidative airspace disease, lung masses or significant pulmonary nodules. Stable small parenchymal bands in the medial segment right middle lobe, dependent left lower lobe and medial lower lobes bilaterally, compatible with mild postinfectious/postinflammatory scarring. No significant regions of subpleural reticulation, ground-glass attenuation, traction bronchiectasis, architectural distortion or frank honeycombing. No significant air trapping on the expiration sequence. Upper abdomen: No acute abnormality. Musculoskeletal: No aggressive appearing focal osseous lesions. Moderate to marked thoracic spondylosis. Partially visualized right total shoulder arthroplasty. IMPRESSION: 1. No acute cardiopulmonary disease. No evidence of interstitial lung disease. 2. Mild postinfectious/postinflammatory scarring in the mid to lower lungs, stable. 3. Three-vessel coronary atherosclerosis. 4. Stable borderline prominent main pulmonary artery. Aortic Atherosclerosis (ICD10-I70.0). Electronically Signed   By: Ilona Sorrel M.D.   On: 04/30/2017 16:02    Cardiac Studies     Patient Profile     77 y.o. female with a history of coronary artery disease and acute on chronic diastolic congestive heart failure.  She presented with worsening shortness of  breath.  Assessment & Plan    1.  History of recent chest discomfort: She has had worsening shortness of breath over the past several weeks.  Plan is for right and left heart catheterization.  We have been waiting a couple of days to allow her renal function to improve. She is a little more short of breath today we will give her 1 dose of torsemide and continue to follow her basic metabolic profile closely.  2.  Acute on chronic diastolic congestive heart failure: We will restart her diuretics as soon as the heart cath is done.  We aggressively diuresed her several days ago which resulted in acute renal insufficiency.  3.  Paroxysmal atrial fibrillation: Is maintaining NSR   For questions or updates, please contact [place] Please consult [email or web address] for contact info under Cardiology/STEMI.      Signed, Mertie Moores, MD  05/02/2017, 10:11 AM

## 2017-05-02 NOTE — Progress Notes (Signed)
PROGRESS NOTE    Dawn Garcia  EEF:[id number] DOB: 1940/11/28 DOA: 04/28/2017 PCP: Glean Hess, MD   Brief Narrative:  Dawn Garcia is a 77 y.o. 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 States she feels like this when she goes into Atrial Fibrillation and has had an ablation in the past. Was recently evaluated by EP Cardiology by Dr. Caryl Comes and Dr. Curt Bears and was sent to the ED for evaluation given concern that SOB maybe something else besides PAF.   She endorses no leg swelling and states she was recently taken off of Amiodarone 2 days prior to admission. Has been having intermittent Chest Pressure that is left side associated with this SOB. Admits to being Lightheaded and sometimes dizzy with these episodes and also has tremendous fatigue and weakness. No Nausea or vomting. TRH was called to evaluate and admit patient for Acute Dyspnea/Dyspnea on Exertion and CP r/o ACS. Patient's Troponin is stable and ECHO has not changed.   Patient's SOB had improved but was worse today. High Resolution CT Chest done and showed no acute cardiopulmonary disease but did show mild postinfectious/postinflammatory scarring in the mid to lower lungs and three-vessel coronary atherosclerosis. Held diuresis and encouraged po Hydration but it appears patient was a little more volume overloaded so she was given po Torsemide by Cardiology. Plan is for Left and Right Heart Catheterization once Renal Fxn improves and likely to be done tomorrow (Cr today was 1.41).   Assessment & Plan:   Active Problems:   Paroxysmal atrial fibrillation (HCC)   Obesity, Class II, BMI 35-39.9   GERD with stricture   Diastolic CHF (HCC)   CKD (chronic kidney disease) stage 3, GFR 30-59 ml/min (HCC)   COPD  (chronic obstructive pulmonary disease) (HCC)   Hypothyroidism following radioiodine therapy   Coronary artery disease   Hyperlipidemia   Chest pain   SSS (sick sinus syndrome) ([place])   Uncontrolled type 2 diabetes mellitus with hyperglycemia, without long-term current use of insulin (HCC)   OSA (obstructive sleep apnea)  Acute Dyspnea/Dyspnea on Exertion -Progressively worsening the last few weeks; ? Related to PAF vs. COPD (unlikley as patient wasn't wheezing) -Checked Patient for Respiratory Virus Panel and Flu and was Negative.  -Cardiology Checkied D-Dimer and was <0.27; Will not get V/Q Scan -Cardiology ordered High Resolution CT to evaluate SOB -High Resolution CT Scan showed no acute Cardiopulmonary Disease. No evidence of interstitial lung disease, mild postinfectious/postinflammatory scarring in the mid to lower lungs which was stable, three-vessel coronary atherosclerosis, and stable borderline prominent main pulmonary artery.  -Xopenex PRN and added Mucinex -Checked ECHOCardiogram as below -CXR done 04/27/16 showed Scarring left base. No edema or consolidation. Heart size normal. Pacemaker leads attached to right atrium and right ventricle. Aortic atherosclerosis present. No adenopathy evident -PT/OT Recommending Home Health PT -Cardiology giving po Torsemide 40 mg x1 today  -Per Cardiology planning Left and Right Heart Catheterization once Renal Function improves more and likely tomorrow   Chest Pain r/o ACS in a patient with CAD and Hx of MI -Has been having intermittent CP, but improved. ? Related to A Fib.  -C/w NTG 0.4 mg SL q54mnPRN -Cycle Troponin x3 and were Flat at 0.03 x3 -Repeat EKG Sinus -C/w Telemetry -Obtained Echocardiogram and showed EF of 50-55% with Grade 1 DD -Cardiology Evaluating and  considering Ischemic Assessment with Cardiac Catheterization but are unsure at this point as it is contingent on Cr; Dr. Debara Pickett at this point feels she has no evidence of  coronary ischemia. EP to evaluate and deferring whether she needs Cardiac Catheterization.  -Cardiology recommending repeating Left and Right Cardiac Catheterization pending improvement of Renal Function and likley will be done tomorrow   Paroxysmal Atrial Fibrillation -Was found to be in Atrial Fibrillation when Device was interrogated as an outpatient and spontaneously converted to NSR in the ED -C/w Anticoagulation with Eliquis -C/w Telemetry -Recently stopped Amiodarone given ineffectiveness of keeping NSR; Cardiology  checking an Amiodarone Level to see whether she maybe a candidate for Dofetilide -Cardiology Consulted and appreciate further recommendations; EP to evaluate -Medical Cardiology feels she is Maintianing Sinus Rhythm and is currently AV Pacing  OSA -C/w Home CPAP  AKI on CKD Stage 3, improving  -Upon Further review Baseline Cr appears to be around 1.1-1.3 -Patient Took her Home Torsemide this AM and likley is Dehydrated based on Labs -Avoid Nephrotoxic Medications if Possible -BUN/Cr has worsened and went from 21/1.60 -> 23/2.02 -> 25/2.13 -> 25/1.78 -> 21/1.61 -> 15/1.41 -Gentle IVF Rehydration with NS at 50 mL/hr x 15 hours stopped as Cr is improving and avoiding Volume Overload -Discussed with Cardiology and recommending no more IVF and encouraging po hydration -Continue to Monitor and Repeat CMP in AM   Uncontrolled Hyperglycemia in the Setting of Diabetes Mellitus Type 2 -Hold Jardiance and Metformin and encourage Discontinuation at D/C -Checked HbA1c and was 9.4; Last one in September 18 was 7.8 -C/w Lantus 20 units sq Daily and C/w Moderate Novolog SSI AC -Added 4 units TIDwm if Patient eats >50% of her meals  -Appreciate Diabetic Education Coordinator Recommendations -Continue to Monitor CBG's; CBG's ranging from 110-222  Hypothyroidism -S/p Radioactive Thyroid Ablation  -Patient recently had Thyroid studies 2 days ago which showed patient had a TSH of  0.198 (low), T3 of 1.8 (low), and Free T4 of 3.54 (high) -Upon review of patient's Medication list, patient unfortunately has been taking Biotin 1000 mcg po Daily which can alter Thyroid Assay results and can falsely elevate T4 and in some cases make TSH look lower in lab settings  -Patient has also been taking Amiodarone (recently stopped 2 days ago)  which decreases Free T3 Conversion from Free T4 -Patient's Thyroid Studies therefore are inaccurate and patient needs to be off of Biotin at least 3 days prior to Rechecking TSH, Free T3, and Free T4 -Will not check again this hospitalization and will continue her Levothyroxine 150 mcg po Daily  -Case was discussed with Dr. Buddy Duty of Endocrinology and Patient to follow up with Dr. Buddy Duty at D/C and he will check her labs and adjust medications as necessary  Acute on Chronic Diastolic CHF -Felt more dyspneic today and slightly volume overloade -Strict I's/O's, Daily Weights -Held Torsemide for AKI on CKD but Cardiology gave her one dose of po 40 mg Today ; Per Cardiology will restart Home Diuretics after Catha -IVF Rehydration stopped and oral rehydration -Patient is -3.463 L;   Dyslipidemia/HLD -C/w Rosuvastatin 40 mg po Daily; Weight is up 7 lbs since admission   SSS s/p PPM -C/w Telemetry -Cardiology Following and appreciate further recommendations   COPD -Currently do not feel she is in Exacerbation -C/w prn Xopenex Nebs -Cardiology checked a High Resolution CT Scan and as above  Hypokalemia -Patient's K+ was 3.0 and improved to 3.5 -Continue to Monitor and Replete as Necessary -  Repeat CMP in AM  Abnormal LFT's -? Related to Hypoperfusion in the Setting of A Fib. -AST was 46 and ALT was 91; Repeat Normalized and now AST this AM was 39, and ALT was 59 -Obtained RUQ U/S and showed previous cholecystectomy and no acute findings. There was mild hepatic steatosis without focal mass -Checked Acute Hepatitis Panel and was Negative     -Repeat CMP in AM  Alk Phos -Unclear Etiology for Elevation  -Alk Phos went from 387 -> 365 -> 378  -Repeat CMP in AM   DVT prophylaxis: Anticoagulated with Apixaban Code Status: FULL CODE Family Communication: No family present at bedside  Disposition Plan: D/C Home with Home Health PT/OT when Cleared by Cardiology  Consultants:  Cardiology   Procedures:  ECHOCARDIOGRAM ------------------------------------------------------------------- Study Conclusions  - Procedure narrative: Transthoracic echocardiography. Technically   difficult study with reduced echo windows. - Left ventricle: The cavity size was normal. Wall thickness was   increased in a pattern of mild LVH. Systolic function was normal.   The estimated ejection fraction was in the range of 50% to 55%.   Doppler parameters are consistent with abnormal left ventricular   relaxation (grade 1 diastolic dysfunction). The E/e&' ratio is   between 8-15, suggesting indeterminate LV filling pressure. - Aortic valve: Trileaflet. Sclerosis without stenosis. There was   trivial regurgitation. - Mitral valve: Mildly thickened leaflets . There was trivial   regurgitation. - Left atrium: The atrium was normal in size. - Inferior vena cava: The vessel was normal in size. The   respirophasic diameter changes were in the normal range (>= 50%),   consistent with normal central venous pressure.  Impressions:  - Compared to a prior study in 10/2016, there has been no   significant change.   Antimicrobials:  Anti-infectives (From admission, onward)   None     Subjective: Seen and examined today at bedside and was a little more dyspneic and felt as if she was wheezing slightly more. No Nausea or vomiting. States yesterday was a better day. No Chest Pain. No lightheadedness. Ready for Cardiac Catheterization   Objective: Vitals:   05/01/17 1130 05/01/17 2015 05/02/17 0634 05/02/17 1133  BP: (!) 147/74 (!) 103/50 (!)  103/94 (!) 110/92  Pulse: 80 60 68 70  Resp: _0 Temp: 98.4 F (36.9 C) 98.2 F (36.8 C) 97.7 F (36.5 C) 98 F (36.7 C)  TempSrc: Oral Oral Oral Oral  SpO2: 99% 98% 98% 97%  Weight:   93 kg (205 lb 1.6 oz)   Height:        Intake/Output Summary (Last 24 hours) at 05/02/2017 1256 Last data filed at 05/02/2017 1253 Gross per 24 hour  Intake 1320 ml  Output 3500 ml  Net -2180 ml   Filed Weights   04/30/17 0430 05/01/17 0646 05/02/17 0634  Weight: 90.5 kg (199 lb 8 oz) 91.9 kg (202 lb 11.2 oz) 93 kg (205 lb 1.6 oz)   Examination: Physical Exam:  Constitutional: WN/WD obese Caucasian female in NAD; Appears in NAD Eyes: Scleare anicteric. Lids normal ENMT: External Ears and and Nose appear normal. Grossly normal hearing Neck: Supple with no JVD Respiratory: Diminished to auscultation with mild crackles. Unlabored breathing and not wearing any supplemental O2 Cardiovascular: RRR; No appreciable m/r/g. Bowel Sounds present  Abdomen: Soft, NT, Distended due to Body Habitus. Bowel sounds present GU: Deferred Musculoskeletal: No contractures; No cyanosis Skin: Warm and Dry; No appreciable rashes or lesions on a limited  skin eval Neurologic: CN 2-12 grossly intact. No appreciable focal deficits Psychiatric: Normal mood and affect. Intact judgement and insight  Data Reviewed: I have personally reviewed following labs and imaging studies  CBC: Recent Labs  Lab 04/28/17 1107 04/29/17 0429 04/30/17 0736 05/01/17 0559 05/02/17 0433  WBC 10.1 8.1 7.2 6.0 6.4  NEUTROABS  --  5.2 4.6 3.4 3.9  HGB 14.9 13.9 13.5 12.5 12.1  HCT 44.5 42.4 41.2 38.6 37.0  MCV 86.1 85.7 86.2 87.5 86.2  PLT 193 192 197 183 818   Basic Metabolic Panel: Recent Labs  Lab 04/28/17 1107 04/29/17 0429 04/30/17 0736 05/01/17 0559 05/02/17 0433  NA 134* 135 137 141 138  K 3.5 3.0* 3.7 4.1 3.5  CL 93* 95* 104 108 108  CO2 23 23 21* 23 21*  GLUCOSE 418* 197* 203* 171* 138*  BUN 23* 25* 25*  21* 15  CREATININE 2.02* 2.13* 1.78* 1.61* 1.41*  CALCIUM 9.6 8.9 9.4 9.3 9.1  MG  --  2.2 2.2 2.0 1.9  PHOS  --  4.2 3.4 3.5 3.8   GFR: Estimated Creatinine Clearance: 36.8 mL/min (A) (by C-G formula based on SCr of 1.41 mg/dL (H)). Liver Function Tests: Recent Labs  Lab 04/29/17 0429 04/30/17 0736 05/01/17 0559 05/02/17 0433  AST 46* 39 36 39  ALT 91* 66* 56* 54  ALKPHOS 361* 387* 365* 378*  BILITOT 1.1 0.9 0.5 0.9  PROT 6.4* 5.9* 5.7* 5.8*  ALBUMIN 3.2* 2.8* 2.8* 2.7*   No results for input(s): LIPASE, AMYLASE in the last 168 hours. No results for input(s): AMMONIA in the last 168 hours. Coagulation Profile: No results for input(s): INR, PROTIME in the last 168 hours. Cardiac Enzymes: Recent Labs  Lab 04/28/17 1949 04/28/17 2228 04/29/17 0051 04/29/17 0429 04/29/17 0928  TROPONINI <0.03 0.03* 0.03* 0.03* <0.03   BNP (last 3 results) No results for input(s): PROBNP in the last 8760 hours. HbA1C: No results for input(s): HGBA1C in the last 72 hours. CBG: Recent Labs  Lab 05/01/17 1124 05/01/17 1618 05/01/17 2137 05/02/17 0731 05/02/17 1131  GLUCAP 253* 110* 222* 137* 203*   Lipid Profile: No results for input(s): CHOL, HDL, LDLCALC, TRIG, CHOLHDL, LDLDIRECT in the last 72 hours. Thyroid Function Tests: No results for input(s): TSH, T4TOTAL, FREET4, T3FREE, THYROIDAB in the last 72 hours. Anemia Panel: No results for input(s): VITAMINB12, FOLATE, FERRITIN, TIBC, IRON, RETICCTPCT in the last 72 hours. Sepsis Labs: No results for input(s): PROCALCITON, LATICACIDVEN in the last 168 hours.  Recent Results (from the past 240 hour(s))  Respiratory Panel by PCR     Status: None   Collection Time: 04/28/17  6:25 PM  Result Value Ref Range Status   Adenovirus NOT DETECTED NOT DETECTED Final   Coronavirus 229E NOT DETECTED NOT DETECTED Final   Coronavirus HKU1 NOT DETECTED NOT DETECTED Final   Coronavirus NL63 NOT DETECTED NOT DETECTED Final   Coronavirus OC43  NOT DETECTED NOT DETECTED Final   Metapneumovirus NOT DETECTED NOT DETECTED Final   Rhinovirus / Enterovirus NOT DETECTED NOT DETECTED Final   Influenza A NOT DETECTED NOT DETECTED Final   Influenza B NOT DETECTED NOT DETECTED Final   Parainfluenza Virus 1 NOT DETECTED NOT DETECTED Final   Parainfluenza Virus 2 NOT DETECTED NOT DETECTED Final   Parainfluenza Virus 3 NOT DETECTED NOT DETECTED Final   Parainfluenza Virus 4 NOT DETECTED NOT DETECTED Final   Respiratory Syncytial Virus NOT DETECTED NOT DETECTED Final   Bordetella pertussis NOT DETECTED  NOT DETECTED Final   Chlamydophila pneumoniae NOT DETECTED NOT DETECTED Final   Mycoplasma pneumoniae NOT DETECTED NOT DETECTED Final    Comment: Performed at [facility], [place] [address] [place], [place] [address]    Radiology Studies: Ct Chest High Resolution  Result Date: 04/30/2017 CLINICAL DATA:  Dyspnea.  Inpatient. EXAM: CT CHEST WITHOUT CONTRAST TECHNIQUE: Multidetector CT imaging of the chest was performed following the standard protocol without intravenous contrast. High resolution imaging of the lungs, as well as inspiratory and expiratory imaging, was performed. COMPARISON:  04/27/2017 chest radiograph.  12/23/2015 chest CT. FINDINGS: Cardiovascular: Stable top-normal heart size. Stable 2 lead left subclavian pacemaker with lead tips in the right atrium and right ventricular apex. Stable trace pericardial effusion/thickening. Three-vessel coronary atherosclerosis. Atherosclerotic nonaneurysmal thoracic aorta. Stable borderline prominent main pulmonary artery (3.3 cm diameter). Mediastinum/Nodes: Apparent thyroidectomy. Unremarkable esophagus. No pathologically enlarged axillary, mediastinal or gross hilar lymph nodes, noting limited sensitivity for the detection of hilar adenopathy on this noncontrast study. Lungs/Pleura: No pneumothorax. No pleural effusion. No acute consolidative airspace disease, lung masses or significant pulmonary  nodules. Stable small parenchymal bands in the medial segment right middle lobe, dependent left lower lobe and medial lower lobes bilaterally, compatible with mild postinfectious/postinflammatory scarring. No significant regions of subpleural reticulation, ground-glass attenuation, traction bronchiectasis, architectural distortion or frank honeycombing. No significant air trapping on the expiration sequence. Upper abdomen: No acute abnormality. Musculoskeletal: No aggressive appearing focal osseous lesions. Moderate to marked thoracic spondylosis. Partially visualized right total shoulder arthroplasty. IMPRESSION: 1. No acute cardiopulmonary disease. No evidence of interstitial lung disease. 2. Mild postinfectious/postinflammatory scarring in the mid to lower lungs, stable. 3. Three-vessel coronary atherosclerosis. 4. Stable borderline prominent main pulmonary artery. Aortic Atherosclerosis (ICD10-I70.0). Electronically Signed   By: Ilona Sorrel M.D.   On: 04/30/2017 16:02   Scheduled Meds: . B-complex with vitamin C  1 tablet Oral Daily  . cholecalciferol  5,000 Units Oral q morning - 10a  . docusate sodium  100 mg Oral Daily  . guaiFENesin  600 mg Oral BID  . insulin aspart  0-15 Units Subcutaneous TID WC  . insulin aspart  4 Units Subcutaneous TID WC  . insulin glargine  20 Units Subcutaneous QHS  . levothyroxine  150 mcg Oral QAC breakfast  . pantoprazole  40 mg Oral Daily  . potassium chloride  20 mEq Oral BID  . rosuvastatin  40 mg Oral Daily  . vitamin B-12  500 mcg Oral Daily   Continuous Infusions:   LOS: 3 days   Kerney Elbe, DO [facility] Pager [phone]  If 7PM-7AM, please contact night-coverage [email or web address] Password [email or web address] 05/02/2017, 12:56 PM

## 2017-05-02 NOTE — Consult Note (Signed)
   [facility] CM Inpatient Consult   05/02/2017  Dawn Garcia [birth year] [id number]  Referral received and acknowledgment of consult . Patient in the Medicare ACO. Will follow up on request for Diabetes Management needs.    Natividad Brood, RN BSN [facility] Liaison  [phone] business mobile phone Toll free office [phone]

## 2017-05-02 NOTE — Progress Notes (Signed)
Pt placed self on home CPAP. Will monitor ?

## 2017-05-03 ENCOUNTER — Inpatient Hospital Stay (HOSPITAL_COMMUNITY)
Admission: EM | Disposition: A | Discharge status: Home Health | Payer: Self-pay | Source: Home / Self Care | Attending: Internal Medicine

## 2017-05-03 DIAGNOSIS — R0602 Shortness of breath: Secondary | ICD-10-CM

## 2017-05-03 DIAGNOSIS — I5031 Acute diastolic (congestive) heart failure: Secondary | ICD-10-CM

## 2017-05-03 HISTORY — PX: RIGHT/LEFT HEART CATH AND CORONARY ANGIOGRAPHY: CATH118266

## 2017-05-03 LAB — POCT I-STAT 3, VENOUS BLOOD GAS (G3P V)
ACID-BASE DEFICIT: 3 mmol/L — AB (ref 0.0–2.0)
ACID-BASE DEFICIT: 3 mmol/L — AB (ref 0.0–2.0)
Acid-base deficit: 5 mmol/L — ABNORMAL HIGH (ref 0.0–2.0)
Bicarbonate: 21.6 mmol/L (ref 20.0–28.0)
Bicarbonate: 22.4 mmol/L (ref 20.0–28.0)
Bicarbonate: 22.5 mmol/L (ref 20.0–28.0)
O2 SAT: 64 %
O2 SAT: 68 %
O2 Saturation: 61 %
PCO2 VEN: 41.4 mmHg — AB (ref 44.0–60.0)
PH VEN: 7.311 (ref 7.250–7.430)
PH VEN: 7.333 (ref 7.250–7.430)
PO2 VEN: 35 mmHg (ref 32.0–45.0)
TCO2: 23 mmol/L (ref 22–32)
TCO2: 24 mmol/L (ref 22–32)
TCO2: 24 mmol/L (ref 22–32)
pCO2, Ven: 42.4 mmHg — ABNORMAL LOW (ref 44.0–60.0)
pCO2, Ven: 42.8 mmHg — ABNORMAL LOW (ref 44.0–60.0)
pH, Ven: 7.341 (ref 7.250–7.430)
pO2, Ven: 35 mmHg (ref 32.0–45.0)
pO2, Ven: 37 mmHg (ref 32.0–45.0)

## 2017-05-03 LAB — CBC WITH DIFFERENTIAL/PLATELET
BASOS ABS: 0 10*3/uL (ref 0.0–0.1)
Basophils Relative: 0 %
EOS ABS: 0.4 10*3/uL (ref 0.0–0.7)
EOS PCT: 5 %
HCT: 41 % (ref 36.0–46.0)
Hemoglobin: 13.5 g/dL (ref 12.0–15.0)
Lymphocytes Relative: 29 %
Lymphs Abs: 2.1 10*3/uL (ref 0.7–4.0)
MCH: 28.5 pg (ref 26.0–34.0)
MCHC: 32.9 g/dL (ref 30.0–36.0)
MCV: 86.5 fL (ref 78.0–100.0)
Monocytes Absolute: 0.4 10*3/uL (ref 0.1–1.0)
Monocytes Relative: 6 %
NEUTROS PCT: 60 %
Neutro Abs: 4.2 10*3/uL (ref 1.7–7.7)
PLATELETS: 210 10*3/uL (ref 150–400)
RBC: 4.74 MIL/uL (ref 3.87–5.11)
RDW: 15.7 % — ABNORMAL HIGH (ref 11.5–15.5)
WBC: 7.1 10*3/uL (ref 4.0–10.5)

## 2017-05-03 LAB — GLUCOSE, CAPILLARY
GLUCOSE-CAPILLARY: 178 mg/dL — AB (ref 65–99)
GLUCOSE-CAPILLARY: 143 mg/dL — AB (ref 65–99)
GLUCOSE-CAPILLARY: 164 mg/dL — AB (ref 65–99)
Glucose-Capillary: 149 mg/dL — ABNORMAL HIGH (ref 65–99)

## 2017-05-03 LAB — COMPREHENSIVE METABOLIC PANEL
ALBUMIN: 2.9 g/dL — AB (ref 3.5–5.0)
ALT: 50 U/L (ref 14–54)
AST: 38 U/L (ref 15–41)
Alkaline Phosphatase: 392 U/L — ABNORMAL HIGH (ref 38–126)
Anion gap: 13 (ref 5–15)
BUN: 16 mg/dL (ref 6–20)
CO2: 21 mmol/L — AB (ref 22–32)
CREATININE: 1.56 mg/dL — AB (ref 0.44–1.00)
Calcium: 9 mg/dL (ref 8.9–10.3)
Chloride: 105 mmol/L (ref 101–111)
GFR calc Af Amer: 36 mL/min — ABNORMAL LOW (ref >60)
GFR calc non Af Amer: 31 mL/min — ABNORMAL LOW (ref >60)
GLUCOSE: 162 mg/dL — AB (ref 65–99)
Potassium: 3.2 mmol/L — ABNORMAL LOW (ref 3.5–5.1)
SODIUM: 139 mmol/L (ref 135–145)
Total Bilirubin: 0.8 mg/dL (ref 0.3–1.2)
Total Protein: 6 g/dL — ABNORMAL LOW (ref 6.5–8.1)

## 2017-05-03 LAB — POCT I-STAT 3, ART BLOOD GAS (G3+)
ACID-BASE DEFICIT: 4 mmol/L — AB (ref 0.0–2.0)
Bicarbonate: 21 mmol/L (ref 20.0–28.0)
O2 Saturation: 96 %
PH ART: 7.353 (ref 7.350–7.450)
TCO2: 22 mmol/L (ref 22–32)
pCO2 arterial: 37.7 mmHg (ref 32.0–48.0)
pO2, Arterial: 88 mmHg (ref 83.0–108.0)

## 2017-05-03 LAB — MAGNESIUM: Magnesium: 1.9 mg/dL (ref 1.7–2.4)

## 2017-05-03 LAB — PHOSPHORUS: PHOSPHORUS: 4.1 mg/dL (ref 2.5–4.6)

## 2017-05-03 SURGERY — RIGHT/LEFT HEART CATH AND CORONARY ANGIOGRAPHY
Anesthesia: LOCAL

## 2017-05-03 MED ORDER — NITROGLYCERIN 1 MG/10 ML FOR IR/CATH LAB
INTRA_ARTERIAL | Status: AC
  Filled 2017-05-03: qty 10

## 2017-05-03 MED ORDER — APIXABAN 5 MG PO TABS
5.0000 mg | ORAL_TABLET | Freq: Two times a day (BID) | ORAL | Status: DC
  Administered 2017-05-04: 5 mg via ORAL
  Filled 2017-05-03: qty 1

## 2017-05-03 MED ORDER — VERAPAMIL HCL 2.5 MG/ML IV SOLN
INTRAVENOUS | Status: AC
  Filled 2017-05-03: qty 2

## 2017-05-03 MED ORDER — SODIUM CHLORIDE 0.9 % WEIGHT BASED INFUSION
3.0000 mL/kg/h | INTRAVENOUS | Status: DC
  Administered 2017-05-03: 3 mL/kg/h via INTRAVENOUS

## 2017-05-03 MED ORDER — SODIUM CHLORIDE 0.9 % IV SOLN
INTRAVENOUS | Status: AC
  Administered 2017-05-03: 18:00:00 via INTRAVENOUS

## 2017-05-03 MED ORDER — IOPAMIDOL (ISOVUE-370) INJECTION 76%
INTRAVENOUS | Status: DC | PRN
  Administered 2017-05-03: 53 mL via INTRA_ARTERIAL

## 2017-05-03 MED ORDER — MIDAZOLAM HCL 2 MG/2ML IJ SOLN
INTRAMUSCULAR | Status: AC
  Filled 2017-05-03: qty 2

## 2017-05-03 MED ORDER — MIDAZOLAM HCL 2 MG/2ML IJ SOLN
INTRAMUSCULAR | Status: DC | PRN
  Administered 2017-05-03: 1 mg via INTRAVENOUS

## 2017-05-03 MED ORDER — POTASSIUM CHLORIDE CRYS ER 20 MEQ PO TBCR
40.0000 meq | EXTENDED_RELEASE_TABLET | Freq: Once | ORAL | Status: AC
  Administered 2017-05-03: 40 meq via ORAL
  Filled 2017-05-03: qty 2

## 2017-05-03 MED ORDER — LIDOCAINE HCL (PF) 1 % IJ SOLN
INTRAMUSCULAR | Status: DC | PRN
  Administered 2017-05-03 (×2): 2 mL

## 2017-05-03 MED ORDER — FENTANYL CITRATE (PF) 100 MCG/2ML IJ SOLN
INTRAMUSCULAR | Status: AC
  Filled 2017-05-03: qty 2

## 2017-05-03 MED ORDER — HEPARIN SODIUM (PORCINE) 1000 UNIT/ML IJ SOLN
INTRAMUSCULAR | Status: AC
  Filled 2017-05-03: qty 1

## 2017-05-03 MED ORDER — SODIUM CHLORIDE 0.9 % WEIGHT BASED INFUSION
1.0000 mL/kg/h | INTRAVENOUS | Status: DC
  Administered 2017-05-03: 1 mL/kg/h via INTRAVENOUS

## 2017-05-03 MED ORDER — TORSEMIDE 20 MG PO TABS
40.0000 mg | ORAL_TABLET | Freq: Every day | ORAL | Status: DC
  Administered 2017-05-04: 40 mg via ORAL
  Filled 2017-05-03: qty 2

## 2017-05-03 MED ORDER — SODIUM CHLORIDE 0.9% FLUSH: 3.0000 mL | INTRAVENOUS | Status: DC | PRN

## 2017-05-03 MED ORDER — HEPARIN (PORCINE) IN NACL 2-0.9 UNIT/ML-% IJ SOLN
INTRAMUSCULAR | Status: AC | PRN
  Administered 2017-05-03: 500 mL

## 2017-05-03 MED ORDER — MORPHINE SULFATE (PF) 2 MG/ML IV SOLN: 1.0000 mg | INTRAVENOUS | Status: DC | PRN

## 2017-05-03 MED ORDER — HEPARIN SODIUM (PORCINE) 1000 UNIT/ML IJ SOLN
INTRAMUSCULAR | Status: DC | PRN
  Administered 2017-05-03: 4500 [IU] via INTRAVENOUS

## 2017-05-03 MED ORDER — SODIUM CHLORIDE 0.9% FLUSH: 3.0000 mL | Freq: Two times a day (BID) | INTRAVENOUS | Status: DC

## 2017-05-03 MED ORDER — VERAPAMIL HCL 2.5 MG/ML IV SOLN
INTRAVENOUS | Status: DC | PRN
  Administered 2017-05-03: 15:00:00 via INTRA_ARTERIAL

## 2017-05-03 MED ORDER — SODIUM CHLORIDE 0.9 % IV SOLN: 250.0000 mL | INTRAVENOUS | Status: DC | PRN

## 2017-05-03 MED ORDER — FENTANYL CITRATE (PF) 100 MCG/2ML IJ SOLN
INTRAMUSCULAR | Status: DC | PRN
  Administered 2017-05-03: 25 ug via INTRAVENOUS

## 2017-05-03 MED ORDER — LIDOCAINE HCL (PF) 1 % IJ SOLN
INTRAMUSCULAR | Status: AC
  Filled 2017-05-03: qty 30

## 2017-05-03 MED ORDER — HEPARIN (PORCINE) IN NACL 2-0.9 UNIT/ML-% IJ SOLN
INTRAMUSCULAR | Status: AC
Start: 2017-05-03 — End: ?
  Filled 2017-05-03: qty 1000

## 2017-05-03 SURGICAL SUPPLY — 18 items
CATH BALLN WEDGE 5F 110CM (CATHETERS) ×2 IMPLANT
CATH INFINITI 5 FR JL3.5 (CATHETERS) ×2 IMPLANT
CATH OPTITORQUE TIG 4.0 5F (CATHETERS) ×2 IMPLANT
COVER PRB 48X5XTLSCP FOLD TPE (BAG) ×1 IMPLANT
COVER PROBE 5X48 (BAG) ×1
DEVICE RAD COMP TR BAND LRG (VASCULAR PRODUCTS) ×2 IMPLANT
GLIDESHEATH SLEND A-KIT 6F 22G (SHEATH) ×2 IMPLANT
GUIDEWIRE .025 260CM (WIRE) ×2 IMPLANT
GUIDEWIRE INQWIRE 1.5J.035X260 (WIRE) ×1 IMPLANT
INQWIRE 1.5J .035X260CM (WIRE) ×2
KIT HEART LEFT (KITS) ×2 IMPLANT
KIT PREMIUM HAND CONTROLLER (KITS) ×2 IMPLANT
KIT SINGLE USE MANIFOLD (KITS) ×2 IMPLANT
PACK CARDIAC CATHETERIZATION (CUSTOM PROCEDURE TRAY) ×2 IMPLANT
PROTECTION STATION PRESSURIZED (MISCELLANEOUS) ×2
SHEATH GLIDE SLENDER 4/5FR (SHEATH) ×4 IMPLANT
STATION PROTECTION PRESSURIZED (MISCELLANEOUS) ×1 IMPLANT
TRANSDUCER W/STOPCOCK (MISCELLANEOUS) ×2 IMPLANT

## 2017-05-03 NOTE — Progress Notes (Addendum)
Progress Note  Patient Name: Dawn Garcia Date of Encounter: 05/03/2017  Primary Cardiologist: Dr. Caryl Comes  Subjective   No active complaints, no CP, palpitations or SOB  Inpatient Medications    Scheduled Meds: . [MAR Hold] B-complex with vitamin C  1 tablet Oral Daily  . [MAR Hold] cholecalciferol  5,000 Units Oral q morning - 10a  . [MAR Hold] docusate sodium  100 mg Oral Daily  . [MAR Hold] guaiFENesin  600 mg Oral BID  . [MAR Hold] insulin aspart  0-15 Units Subcutaneous TID WC  . [MAR Hold] insulin aspart  4 Units Subcutaneous TID WC  . [MAR Hold] insulin glargine  20 Units Subcutaneous QHS  . [MAR Hold] levothyroxine  150 mcg Oral QAC breakfast  . [MAR Hold] pantoprazole  40 mg Oral Daily  . [MAR Hold] rosuvastatin  40 mg Oral Daily  . sodium chloride flush  3 mL Intravenous Q12H  . [MAR Hold] torsemide  40 mg Oral Daily  . [MAR Hold] vitamin B-12  500 mcg Oral Daily   Continuous Infusions: . sodium chloride    . sodium chloride 1 mL/kg/hr (05/03/17 0704)  . heparin     PRN Meds: sodium chloride, [MAR Hold] acetaminophen **OR** [MAR Hold] acetaminophen, [MAR Hold] diclofenac sodium, fentaNYL, heparin, [MAR Hold] levalbuterol, lidocaine (PF), midazolam, [MAR Hold] nitroGLYCERIN, [MAR Hold] ondansetron **OR** [MAR Hold] ondansetron (ZOFRAN) IV, [MAR Hold] polyethylene glycol, Radial Cocktail/Verapamil only, sodium chloride flush, [MAR Hold] traMADol   Vital Signs    Vitals:   05/02/17 0634 05/02/17 1133 05/02/17 1957 05/03/17 0539  BP: (!) 103/94 (!) 110/92 (!) 114/54 (!) 112/48  Pulse: 68 70 70 (!) 117  Resp: 18 18 18 18   Temp: 97.7 F (36.5 C) 98 F (36.7 C) 98.2 F (36.8 C) 97.9 F (36.6 C)  TempSrc: Oral Oral Oral Oral  SpO2: 98% 97% 98% 99%  Weight: 205 lb 1.6 oz (93 kg)   198 lb 12.8 oz (90.2 kg)  Height:        Intake/Output Summary (Last 24 hours) at 05/03/2017 1516 Last data filed at 05/03/2017 6010 Gross per 24 hour  Intake 765.1 ml  Output  2400 ml  Net -1634.9 ml   Filed Weights   05/01/17 0646 05/02/17 0634 05/03/17 0539  Weight: 202 lb 11.2 oz (91.9 kg) 205 lb 1.6 oz (93 kg) 198 lb 12.8 oz (90.2 kg)    Telemetry    AV paced, has had some AFib, rate controlled last couple days, paroxysmal - Personally Reviewed  ECG    No new tracings - Personally Reviewed  Physical Exam    GEN: No acute distress.   Neck: No JVD Cardiac:  RRR, no murmurs, rubs, or gallops.  Respiratory: CTA b/l. GI: Soft, nontender, non-distended  MS: No edema; No deformity. Neuro:  Nonfocal  Psych: Normal affect   Labs    Chemistry Recent Labs  Lab 05/01/17 0559 05/02/17 0433 05/03/17 0723  NA 141 138 139  K 4.1 3.5 3.2*  CL 108 108 105  CO2 23 21* 21*  GLUCOSE 171* 138* 162*  BUN 21* 15 16  CREATININE 1.61* 1.41* 1.56*  CALCIUM 9.3 9.1 9.0  PROT 5.7* 5.8* 6.0*  ALBUMIN 2.8* 2.7* 2.9*  AST 36 39 38  ALT 56* 54 50  ALKPHOS 365* 378* 392*  BILITOT 0.5 0.9 0.8  GFRNONAA 30* 35* 31*  GFRAA 35* 41* 36*  ANIONGAP 10 9 13      Hematology Recent Labs  Lab 05/01/17  0559 05/02/17 0433 05/03/17 0723  WBC 6.0 6.4 7.1  RBC 4.41 4.29 4.74  HGB 12.5 12.1 13.5  HCT 38.6 37.0 41.0  MCV 87.5 86.2 86.5  MCH 28.3 28.2 28.5  MCHC 32.4 32.7 32.9  RDW 15.6* 15.3 15.7*  PLT 183 188 210    Cardiac Enzymes Recent Labs  Lab 04/28/17 2228 04/29/17 0051 04/29/17 0429 04/29/17 0928  TROPONINI 0.03* 0.03* 0.03* <0.03    Recent Labs  Lab 04/28/17 1128  TROPIPOC 0.00     BNPNo results for input(s): BNP, PROBNP in the last 168 hours.   DDimer  Recent Labs  Lab 04/28/17 1637  DDIMER <0.27     Radiology    No results found.  Cardiac Studies   04/29/17: TTE Study Conclusions - Procedure narrative: Transthoracic echocardiography. Technically   difficult study with reduced echo windows. - Left ventricle: The cavity size was normal. Wall thickness was   increased in a pattern of mild LVH. Systolic function was normal.    The estimated ejection fraction was in the range of 50% to 55%.   Doppler parameters are consistent with abnormal left ventricular   relaxation (grade 1 diastolic dysfunction). The E/e&' ratio is   between 8-15, suggesting indeterminate LV filling pressure. - Aortic valve: Trileaflet. Sclerosis without stenosis. There was   trivial regurgitation. - Mitral valve: Mildly thickened leaflets . There was trivial   regurgitation. - Left atrium: The atrium was normal in size. - Inferior vena cava: The vessel was normal in size. The   respirophasic diameter changes were in the normal range (>= 50%),   consistent with normal central venous pressure. Impressions: - Compared to a prior study in 10/2016, there has been no   significant change.  Patient Profile     77 y.o. female 77 y.o. female with a history of remote stenting (date/vessel unknown), PAF with reported ablation previously, SSS s/p PPM, COPD, DM, HLD, CKD III, hypothyroidism (thyroid removal).   Admitted with persistent and significant SOB of unclear etiology.   AF/AAD Hx: Previously on dronedarone but stopped due to diarrhea.  She had been started on amiodarone 03/2017 for recurrent AF, stopped Apr 27, 2017 with recurrent AFib (felt to have failed drug)  Device information: SJM dual chamber PPM, implanted 11/10/15 Device interrogated 04/18/17 Battery and lead measurements were good, 19% AF burden  Assessment & Plan    1.  Coronary artery disease       Stented in 2014.         Patient has been having on and off chest discomfort with her shortness of breath.                    Planned for right and left heart catheterization today.   2.  Acute on chronic diastolic congestive heart failure       In review of notes, on admission not felt to be overtly overloaded       High resolution CT was without acute findings       During stay, felt to have component of diastolic CHF       She is cumulatively fluid negative -6,443ml, (-4,011ml  yesterday) single dose of torsemide?       weight is down 6lbs from yesterday       diuretics held with renal insuffiency       Planned for resuming post cath   Deferred to primary cardiology team  3.  Paroxysmal atrial fibrillation  CHA2DS2Vasc is 4, on Eliquis (held for cath)      Suspected to be at least in-part some of her SOB, apparently felt better with conversion to SR here      Dr. Olin Pia thoughts were to consider Dofetilide next  Amiodarone level 04/30/17 was 0.6, needs to be <0.3 to start Tikosyn, so far is mostly maintaining SR here.  In d//w pharmacy, uncertain, though recommended to recheck amio level in 2 weeks.  I will arrange AFib clinic follow up for this.  Dr. Lovena Le will see.  4.  AKI      Primary team is following      Max this admission 2.13, down to 1.56 today (baseline looks about 1.1-1.2)  5. Hyperthyroid     On synthroid s/p thyroidectomy historically     TSH 0.198     Patient has not wanted medicine adjustment     This will make AFib more difficult to control  6. Abn LFTs     Medicine following, improving, no clear etiology is noted    For questions or updates, please contact [place] Please consult [email or web address] for contact info under Cardiology/STEMI.      Signed, Baldwin Jamaica, PA-C  05/03/2017, 3:16 PM    EP Attending  Patient seen and examined. Agree with the findings as noted above. The patient has presented with sob and atrial fib with a controlled VR and ventricular pacing. She is unable to take amiodarone and it was continued. Dofetilide is a reasonable option but can only be started once her amiodarone levels have fallen and this will be at least 2 weeks, possibly longer. Currently we have no good way to maintain the patient in NSR and rate control alone our only option. She will need to be euthyroid and it looks like her thyroid is too high.   Mikle Bosworth.D.

## 2017-05-03 NOTE — Plan of Care (Signed)
  Activity: Ability to return to baseline activity level will improve 05/03/2017 0339 - Progressing by Tristan Schroeder, RN   Cardiovascular: Ability to achieve and maintain adequate cardiovascular perfusion will improve 05/03/2017 0339 - Progressing by Tristan Schroeder, RN

## 2017-05-03 NOTE — Progress Notes (Addendum)
Progress Note  Patient Name: Dawn Garcia Date of Encounter: 05/03/2017  Primary Cardiologist: No primary care provider on file.   Subjective   Reports improvement in breathing.  Denies chest pain or discomfort.  Inpatient Medications    Scheduled Meds: . B-complex with vitamin C  1 tablet Oral Daily  . cholecalciferol  5,000 Units Oral q morning - 10a  . docusate sodium  100 mg Oral Daily  . guaiFENesin  600 mg Oral BID  . insulin aspart  0-15 Units Subcutaneous TID WC  . insulin aspart  4 Units Subcutaneous TID WC  . insulin glargine  20 Units Subcutaneous QHS  . levothyroxine  150 mcg Oral QAC breakfast  . pantoprazole  40 mg Oral Daily  . rosuvastatin  40 mg Oral Daily  . sodium chloride flush  3 mL Intravenous Q12H  . vitamin B-12  500 mcg Oral Daily   Continuous Infusions: . sodium chloride    . sodium chloride 1 mL/kg/hr (05/03/17 0704)   PRN Meds: sodium chloride, acetaminophen **OR** acetaminophen, diclofenac sodium, levalbuterol, nitroGLYCERIN, ondansetron **OR** ondansetron (ZOFRAN) IV, polyethylene glycol, sodium chloride flush, traMADol   Vital Signs    Vitals:   05/02/17 0634 05/02/17 1133 05/02/17 1957 05/03/17 0539  BP: (!) 103/94 (!) 110/92 (!) 114/54 (!) 112/48  Pulse: 68 70 70 (!) 117  Resp: 18 18 18 18   Temp: 97.7 F (36.5 C) 98 F (36.7 C) 98.2 F (36.8 C) 97.9 F (36.6 C)  TempSrc: Oral Oral Oral Oral  SpO2: 98% 97% 98% 99%  Weight: 93 kg (205 lb 1.6 oz)   90.2 kg (198 lb 12.8 oz)  Height:        Intake/Output Summary (Last 24 hours) at 05/03/2017 0842 Last data filed at 05/03/2017 9233 Gross per 24 hour  Intake 1125.1 ml  Output 5020 ml  Net -3894.9 ml   Filed Weights   05/01/17 0646 05/02/17 0634 05/03/17 0539  Weight: 91.9 kg (202 lb 11.2 oz) 93 kg (205 lb 1.6 oz) 90.2 kg (198 lb 12.8 oz)    Telemetry    AV paced - Personally Reviewed  ECG    No new tracings - Personally Reviewed  Physical Exam   GEN: No acute  distress.   Neck: No JVD Cardiac: RRR, no murmurs, rubs, or gallops.  Respiratory: Clear to auscultation bilaterally. GI: Soft, nontender, non-distended  MS: No edema; No deformity. Neuro:  Nonfocal  Psych: Normal affect   Labs    Chemistry Recent Labs  Lab 05/01/17 0559 05/02/17 0433 05/03/17 0723  NA 141 138 139  K 4.1 3.5 3.2*  CL 108 108 105  CO2 23 21* 21*  GLUCOSE 171* 138* 162*  BUN 21* 15 16  CREATININE 1.61* 1.41* 1.56*  CALCIUM 9.3 9.1 9.0  PROT 5.7* 5.8* 6.0*  ALBUMIN 2.8* 2.7* 2.9*  AST 36 39 38  ALT 56* 54 50  ALKPHOS 365* 378* 392*  BILITOT 0.5 0.9 0.8  GFRNONAA 30* 35* 31*  GFRAA 35* 41* 36*  ANIONGAP 10 9 13      Hematology Recent Labs  Lab 05/01/17 0559 05/02/17 0433 05/03/17 0723  WBC 6.0 6.4 7.1  RBC 4.41 4.29 4.74  HGB 12.5 12.1 13.5  HCT 38.6 37.0 41.0  MCV 87.5 86.2 86.5  MCH 28.3 28.2 28.5  MCHC 32.4 32.7 32.9  RDW 15.6* 15.3 15.7*  PLT 183 188 210    Cardiac Enzymes Recent Labs  Lab 04/28/17 2228 04/29/17 0051 04/29/17 0429 04/29/17  0928  TROPONINI 0.03* 0.03* 0.03* <0.03    Recent Labs  Lab 04/28/17 1128  TROPIPOC 0.00     BNPNo results for input(s): BNP, PROBNP in the last 168 hours.   DDimer  Recent Labs  Lab 04/28/17 1637  DDIMER <0.27     Radiology    No results found.  Cardiac Studies     Patient Profile     77 y.o. female 77 y.o. female with a history of coronary artery disease and acute on chronic diastolic congestive heart failure.  She presented with worsening shortness of breath.   Assessment & Plan    1.  Coronary artery disease Stented in 2014.   Patient has been having on and off chest discomfort with her shortness of breath. Plan is for right and left heart catheterization today.   2.  Acute on chronic diastolic congestive heart failure Restarting her diuretics as soon as the heart cath is done.  Had one dose of torsemide with -3.5 L output and 7lb decrease yesterday.  Reports  continued improvement in breathing  3.  Paroxysmal atrial fibrillation Is maintaining NSR   4. AKI Patient's creatinine is 1.56, improved during admission.  Baseline appears 1.2. Continue to follow renal function post cath    For questions or updates, please contact [place] Please consult [email or web address] for contact info under Cardiology/STEMI.      Signed, Boyd Kerbs, DO  05/03/2017, 8:42 AM    Attending Note:   The patient was seen and examined.  Agree with assessment and plan as noted above.  Changes made to the above note as needed.  Patient seen and independently examined with Kalman Shan, DO .   We discussed all aspects of the encounter. I agree with the assessment and plan as stated above.  1.   Chest pain/history of coronary artery disease: Patient has had some episodes of chest pain.  Her renal function is improved.  She will be getting a heart cath today.  2.  Acute on chronic diastolic congestive heart failure: Continue therapy.  Will resume diuretics after the heart cath.  3.  Paroxysmal atrial fibrillation: Stable, containing sinus rhythm currently.    I have spent a total of 40 minutes with patient reviewing hospital  notes , telemetry, EKGs, labs and examining patient as well as establishing an assessment and plan that was discussed with the patient. > 50% of time was spent in direct patient care.    Thayer Headings, Brooke Bonito., MD, [facility] 05/03/2017, 3:07 PM [address]  [place] Pager [phone]

## 2017-05-03 NOTE — Progress Notes (Signed)
PROGRESS NOTE    Dawn Garcia  ZHY:[id number] DOB: 1940/08/13 DOA: 04/28/2017 PCP: Glean Hess, MD   Chief Complaint  Patient presents with  . Shortness of Breath    Brief Narrative:  HPI on 04/28/2017 by Dr. Raiford Noble Dawn Garcia is a 77 y.o. 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 States she feels like this when she goes into Atrial Fibrillation and has had an ablation in the past. Was recently evaluated by EP Cardiology by Dr. Caryl Comes yesterday and Dr. Curt Bears today and was sent to the ED for evaluation given concern that SOB maybe something else besides PAF. She endorses no leg swelling and states she was recently taken off of Amiodarone 2 days ago. Has been having intermittent Chest Pressure that is left side associated with this SOB. Admits to being Lightheaded and sometimes dizzy with these episodes and also has tremendous fatigue and weakness. No Nausea or vomting. [facility] was called to evaluate and admit patient for Acute Dyspnea/Dyspnea on Exertion and CP r/o ACS. Cardiology had already evaluated the patient.   Interim history Shortness of breath has appear to be improving slowly.  Cardiology consulted and appreciated, plan for right and left heart catheterization today.  Assessment & Plan   Acute dyspnea/dyspnea on exertion -Patient has had progressively worsening dyspnea over the last several weeks, question of is related to PAF versus COPD -Respiratory viral panel and  influenza PCR negative -Cardiology check d-dimer which was less than 0.27, VQ scan not obtained -CT chest showed no acute cardiopulmonary disease.  No evidence of interstitial lung disease. Mild postinfectious postinflammatory scarring in the mid to lower lungs which is stable.  Three-vessel coronary arthrosclerosis. Stable borderline prominent main pulmonary artery. -Continue breathing treatments and Mucinex. -Echocardiogram listed below -Cardiology consulted and appreciated, plan for right and left heart catheterization later today  Acute on chronic diastolic heart failure -Patient was given some diuresis however held due to worsening renal function -Continue to monitor intake and output, daily weights  Chest pain -With history of coronary artery disease -Has been ongoing and intermittent -Possibly secondary to atrial fibrillation -Troponins were cycled and found to be flat x3 sets EKG reviewed and found to be in sinus rhythm -Echocardiogram showed an EF of 2952%, grade 1 diastolic dysfunction  -Cardiology consulted and appreciated, Plan for left and right heart catheterization later today  Acute kidney injury on CKD, stage III -Creatinine today 1.56 -Baseline creatinine 1.1-1.3 (creatinine had spiked up to 2.13 on 04/29/2017) -Continue to monitor BMP  Paroxysmal atrial fibrillation -Patient was noted to have atrial fibrillation when device was interrogated as an outpatient however she converted to normal sinus rhythm -Continue Eliquis for anticoagulation -Amiodarone discontinued -Cardiology investigating whether patient would be a candidate for Dofetilide  Diabetes mellitus, type II with hyperglycemia -Metformin and Jardiance held -Hemoglobin A1c 9.4 -Continue Lantus, insulin sliding scale, pre-meal insulin with CBG monitoring -Diabetes coordinator following  Hypothyroidism -Status post radioactive thyroid ablation -Prior to admission, TSH 0.198, T3 1.8, free T4 3.54 -Patient was also taking biotin as an outpatient along with amiodarone therefore possibly displacing patient's thyroid hormone -Patient should have repeat thyroid function studies as an outpatient -Continue Synthroid and follow-up with Dr. Buddy Duty  Obstructive sleep apnea -Continue  CPAP  Hyperlipidemia -Continue statin  Sick sinus syndrome -Status post pacemaker placement  COPD -  Appears to be stable, no wheezing noted on examination -Continue nebulizer treatments as needed -CT chest as stated above  Hypokalemia -K 3.2, will replace (magnesium 1.9) -Possibly secondary to diuresis -continue to monitor BMP  Abnormal LFTs -Possibly due to hypoperfusion in setting of atrial fibrillation -Right upper quadrant ultrasound showed no acute findings.  Mild hepatic steatosis without focal mass. -Hepatitis panel unremarkable LFTs do appear to be improving -continue to monitor CMP  DVT Prophylaxis  Eliquis  Code Status: Full  Family Communication: None at bedside  Disposition Plan: Admitted. Pending catheterization and further recommendations from cardiology.  Suspect home with home health PT and OT when stable  Consultants Cardiology  Procedures  Echocardiogram Right upper quadrant ultrasound  Antibiotics   Anti-infectives (From admission, onward)   None      Subjective:   Dawn Garcia seen and examined today.  Feels breathing is mildly improved but not back to baseline.  Denies current chest pain.  Denies abdominal pain, nausea or vomiting, diarrhea constipation, dizziness or headache. Objective:   Vitals:   05/02/17 0634 05/02/17 1133 05/02/17 1957 05/03/17 0539  BP: (!) 103/94 (!) 110/92 (!) 114/54 (!) 112/48  Pulse: 68 70 70 (!) 117  Resp: 18 18 18 18   Temp: 97.7 F (36.5 C) 98 F (36.7 C) 98.2 F (36.8 C) 97.9 F (36.6 C)  TempSrc: Oral Oral Oral Oral  SpO2: 98% 97% 98% 99%  Weight: 93 kg (205 lb 1.6 oz)   90.2 kg (198 lb 12.8 oz)  Height:        Intake/Output Summary (Last 24 hours) at 05/03/2017 1418 Last data filed at 05/03/2017 9371 Gross per 24 hour  Intake 765.1 ml  Output 2400 ml  Net -1634.9 ml   Filed Weights   05/01/17 0646 05/02/17 0634 05/03/17 0539  Weight: 91.9 kg (202 lb 11.2 oz) 93 kg (205 lb 1.6 oz) 90.2 kg  (198 lb 12.8 oz)    Exam  General: Well developed, well nourished, NAD, appears stated age  45: NCAT,mucous membranes moist.   Neck: Supple, no JVD  Cardiovascular: S1 S2 auscultated, Tachycardiac, no murmur  Respiratory: Diminished breath sounds, no wheezing   Abdomen: Soft, nontender, nondistended, + bowel sounds  Extremities: warm dry without cyanosis clubbing or edema  Neuro: AAOx3, nonfocal   Psych: Normal affect and demeanor with intact judgement and insight   Data Reviewed: I have personally reviewed following labs and imaging studies  CBC: Recent Labs  Lab 04/29/17 0429 04/30/17 0736 05/01/17 0559 05/02/17 0433 05/03/17 0723  WBC 8.1 7.2 6.0 6.4 7.1  NEUTROABS 5.2 4.6 3.4 3.9 4.2  HGB 13.9 13.5 12.5 12.1 13.5  HCT 42.4 41.2 38.6 37.0 41.0  MCV 85.7 86.2 87.5 86.2 86.5  PLT 192 197 183 188 696   Basic Metabolic Panel: Recent Labs  Lab 04/29/17 0429 04/30/17 0736 05/01/17 0559 05/02/17 0433 05/03/17 0723  NA 135 137 141 138 139  K 3.0* 3.7 4.1 3.5 3.2*  CL 95* 104 108 108 105  CO2 23 21* 23 21* 21*  GLUCOSE 197* 203* 171* 138* 162*  BUN 25* 25* 21* 15 16  CREATININE 2.13* 1.78* 1.61* 1.41* 1.56*  CALCIUM 8.9 9.4 9.3 9.1 9.0  MG 2.2 2.2 2.0 1.9 1.9  PHOS 4.2 3.4 3.5 3.8 4.1   GFR: Estimated Creatinine Clearance: 32.7 mL/min (A) (by C-G formula based on SCr of 1.56 mg/dL (H)). Liver Function Tests: Recent Labs  Lab 04/29/17 0429 04/30/17 0736 05/01/17 0559 05/02/17 0433 05/03/17  0723  AST 46* 39 36 39 38  ALT 91* 66* 56* 54 50  ALKPHOS 361* 387* 365* 378* 392*  BILITOT 1.1 0.9 0.5 0.9 0.8  PROT 6.4* 5.9* 5.7* 5.8* 6.0*  ALBUMIN 3.2* 2.8* 2.8* 2.7* 2.9*   No results for input(s): LIPASE, AMYLASE in the last 168 hours. No results for input(s): AMMONIA in the last 168 hours. Coagulation Profile: Recent Labs  Lab 05/02/17 1503  INR 1.37   Cardiac Enzymes: Recent Labs  Lab 04/28/17 1949 04/28/17 2228 04/29/17 0051  04/29/17 0429 04/29/17 0928  TROPONINI <0.03 0.03* 0.03* 0.03* <0.03   BNP (last 3 results) No results for input(s): PROBNP in the last 8760 hours. HbA1C: No results for input(s): HGBA1C in the last 72 hours. CBG: Recent Labs  Lab 05/02/17 1131 05/02/17 1621 05/02/17 2109 05/03/17 0735 05/03/17 1110  GLUCAP 203* 147* 164* 149* 178*   Lipid Profile: No results for input(s): CHOL, HDL, LDLCALC, TRIG, CHOLHDL, LDLDIRECT in the last 72 hours. Thyroid Function Tests: No results for input(s): TSH, T4TOTAL, FREET4, T3FREE, THYROIDAB in the last 72 hours. Anemia Panel: No results for input(s): VITAMINB12, FOLATE, FERRITIN, TIBC, IRON, RETICCTPCT in the last 72 hours. Urine analysis:    Component Value Date/Time   COLORURINE YELLOW 04/28/2017 1914   APPEARANCEUR CLEAR 04/28/2017 1914   LABSPEC 1.012 04/28/2017 1914   PHURINE 7.0 04/28/2017 1914   GLUCOSEU >=500 (A) 04/28/2017 1914   HGBUR NEGATIVE 04/28/2017 1914   BILIRUBINUR NEGATIVE 04/28/2017 1914   BILIRUBINUR neg 05/02/2016 1217   [place] 04/28/2017 1914   PROTEINUR 30 (A) 04/28/2017 1914   UROBILINOGEN 0.2 05/02/2016 1217   NITRITE NEGATIVE 04/28/2017 1914   LEUKOCYTESUR NEGATIVE 04/28/2017 1914   Sepsis Labs: @LABRCNTIP (procalcitonin:4,lacticidven:4)  ) Recent Results (from the past 240 hour(s))  Respiratory Panel by PCR     Status: None   Collection Time: 04/28/17  6:25 PM  Result Value Ref Range Status   Adenovirus NOT DETECTED NOT DETECTED Final   Coronavirus 229E NOT DETECTED NOT DETECTED Final   Coronavirus HKU1 NOT DETECTED NOT DETECTED Final   Coronavirus NL63 NOT DETECTED NOT DETECTED Final   Coronavirus OC43 NOT DETECTED NOT DETECTED Final   Metapneumovirus NOT DETECTED NOT DETECTED Final   Rhinovirus / Enterovirus NOT DETECTED NOT DETECTED Final   Influenza A NOT DETECTED NOT DETECTED Final   Influenza B NOT DETECTED NOT DETECTED Final   Parainfluenza Virus 1 NOT DETECTED NOT DETECTED  Final   Parainfluenza Virus 2 NOT DETECTED NOT DETECTED Final   Parainfluenza Virus 3 NOT DETECTED NOT DETECTED Final   Parainfluenza Virus 4 NOT DETECTED NOT DETECTED Final   Respiratory Syncytial Virus NOT DETECTED NOT DETECTED Final   Bordetella pertussis NOT DETECTED NOT DETECTED Final   Chlamydophila pneumoniae NOT DETECTED NOT DETECTED Final   Mycoplasma pneumoniae NOT DETECTED NOT DETECTED Final    Comment: Performed at [facility], [place] [address] [place], [place] [address]      Radiology Studies: No results found.   Scheduled Meds: . B-complex with vitamin C  1 tablet Oral Daily  . cholecalciferol  5,000 Units Oral q morning - 10a  . docusate sodium  100 mg Oral Daily  . guaiFENesin  600 mg Oral BID  . insulin aspart  0-15 Units Subcutaneous TID WC  . insulin aspart  4 Units Subcutaneous TID WC  . insulin glargine  20 Units Subcutaneous QHS  . levothyroxine  150 mcg Oral QAC breakfast  . pantoprazole  40 mg Oral Daily  . rosuvastatin  40 mg Oral Daily  . sodium chloride flush  3 mL Intravenous Q12H  . torsemide  40 mg Oral Daily  . vitamin B-12  500 mcg Oral Daily   Continuous Infusions: . sodium chloride    . sodium chloride 1 mL/kg/hr (05/03/17 0704)     LOS: 4 days   Time Spent in minutes   30 minutes  Veronika Heard D.O. on 05/03/2017 at 2:18 PM  Between 7am to 7pm - Pager - [phone]  After 7pm go to [email or web address] - password [email or web address]  And look for the night coverage person covering for me after hours  [facility] Office  [phone]

## 2017-05-03 NOTE — Care Management Important Message (Signed)
Important Message  Patient Details  Name: Dawn Garcia MRN: [id number] Date of Birth: 10-02-1940   Medicare Important Message Given:  Yes    Khamani Daniely 05/03/2017, 1:23 PM

## 2017-05-03 NOTE — Interval H&P Note (Signed)
History and Physical Interval Note:  05/03/2017 2:47 PM  Dawn Garcia  has presented today for surgery, with the diagnosis of recurrent heart failure, atrial fibrillation, and concerning symptoms for progressive angina with dyspnea.    The various methods of treatment have been discussed with the patient and family. After consideration of risks, benefits and other options for treatment, the patient has consented to  Procedure(s): RIGHT/LEFT HEART CATH AND CORONARY ANGIOGRAPHY (N/A) with possible PERCUTANEOUS CORONARY INTERVENTION as a surgical intervention .  The patient's history has been reviewed, patient examined, no change in status, stable for surgery.  I have reviewed the patient's chart and labs.  Questions were answered to the patient's satisfaction.     Cath Lab Visit (complete for each Cath Lab visit)  Clinical Evaluation Leading to the Procedure:   ACS: No. -Acute exacerbation, but not true ACS  Non-ACS:    Anginal Classification: CCS IV  Anti-ischemic medical therapy: Minimal Therapy (1 class of medications)  Non-Invasive Test Results: No non-invasive testing performed  Prior CABG: No previous CABG  Glenetta Hew

## 2017-05-03 NOTE — Consult Note (Signed)
   [facility] CM Inpatient Consult   05/03/2017  Dawn Garcia March 20, 1940 [id number]   Consult received for [facility] Care Management services for Diabetes Medication needs. Patient evaluated for community based chronic disease management services with [place] [facility] as a benefit of patient's [employer]. Spoke with patient at bedside to explain [facility] services.  Patient endorses [name], MD as her primary care provider. Pharmacy:  [facility]   Patient will receive post hospital discharge call and will be evaluated by the pharmacist for needs with medications.  She states she needs assistance with keeping out of the donut with her medications, if possible.  Left contact information and [facility] literature at bedside. Made Inpatient Case Manager aware that [facility] following. Of note, [facility] Care Management services does not replace or interfere with any services that are arranged by inpatient case management or social work.  For additional questions or referrals please contact:    Natividad Brood, RN BSN [facility] Liaison  [phone] business mobile phone Toll free office [phone]

## 2017-05-04 ENCOUNTER — Encounter (HOSPITAL_COMMUNITY): Payer: Self-pay | Admitting: Cardiology

## 2017-05-04 ENCOUNTER — Ambulatory Visit: Payer: Medicare Other

## 2017-05-04 DIAGNOSIS — I2510 Atherosclerotic heart disease of native coronary artery without angina pectoris: Secondary | ICD-10-CM

## 2017-05-04 DIAGNOSIS — R0600 Dyspnea, unspecified: Secondary | ICD-10-CM

## 2017-05-04 DIAGNOSIS — I4891 Unspecified atrial fibrillation: Secondary | ICD-10-CM

## 2017-05-04 DIAGNOSIS — I5032 Chronic diastolic (congestive) heart failure: Secondary | ICD-10-CM

## 2017-05-04 LAB — COMPREHENSIVE METABOLIC PANEL
ALK PHOS: 361 U/L — AB (ref 38–126)
ALT: 50 U/L (ref 14–54)
AST: 48 U/L — AB (ref 15–41)
Albumin: 2.8 g/dL — ABNORMAL LOW (ref 3.5–5.0)
Anion gap: 11 (ref 5–15)
BILIRUBIN TOTAL: 0.9 mg/dL (ref 0.3–1.2)
BUN: 13 mg/dL (ref 6–20)
CHLORIDE: 109 mmol/L (ref 101–111)
CO2: 19 mmol/L — ABNORMAL LOW (ref 22–32)
CREATININE: 1.32 mg/dL — AB (ref 0.44–1.00)
Calcium: 9 mg/dL (ref 8.9–10.3)
GFR calc Af Amer: 44 mL/min — ABNORMAL LOW (ref >60)
GFR, EST NON AFRICAN AMERICAN: 38 mL/min — AB (ref >60)
Glucose, Bld: 168 mg/dL — ABNORMAL HIGH (ref 65–99)
Potassium: 4 mmol/L (ref 3.5–5.1)
Sodium: 139 mmol/L (ref 135–145)
TOTAL PROTEIN: 5.7 g/dL — AB (ref 6.5–8.1)

## 2017-05-04 LAB — CBC
HCT: 38.4 % (ref 36.0–46.0)
Hemoglobin: 12.3 g/dL (ref 12.0–15.0)
MCH: 27.8 pg (ref 26.0–34.0)
MCHC: 32 g/dL (ref 30.0–36.0)
MCV: 86.7 fL (ref 78.0–100.0)
PLATELETS: 211 10*3/uL (ref 150–400)
RBC: 4.43 MIL/uL (ref 3.87–5.11)
RDW: 15.7 % — AB (ref 11.5–15.5)
WBC: 5.9 10*3/uL (ref 4.0–10.5)

## 2017-05-04 LAB — GLUCOSE, CAPILLARY
GLUCOSE-CAPILLARY: 299 mg/dL — AB (ref 65–99)
Glucose-Capillary: 154 mg/dL — ABNORMAL HIGH (ref 65–99)

## 2017-05-04 MED ORDER — TORSEMIDE 20 MG PO TABS: 40.0000 mg | ORAL_TABLET | Freq: Every day | ORAL | 0 refills | Status: DC

## 2017-05-04 MED FILL — Heparin Sodium (Porcine) 2 Unit/ML in Sodium Chloride 0.9%: INTRAMUSCULAR | Qty: 500 | Status: AC

## 2017-05-04 MED FILL — Nitroglycerin IV Soln 100 MCG/ML in D5W: INTRA_ARTERIAL | Qty: 10 | Status: AC

## 2017-05-04 NOTE — Progress Notes (Addendum)
Progress Note  Patient Name: Dawn Garcia Date of Encounter: 05/04/2017  Primary Cardiologist: [place] she feels well.  Denies shortness of breath or chest pain.  Inpatient Medications    Scheduled Meds: . apixaban  5 mg Oral BID  . B-complex with vitamin C  1 tablet Oral Daily  . cholecalciferol  5,000 Units Oral q morning - 10a  . docusate sodium  100 mg Oral Daily  . guaiFENesin  600 mg Oral BID  . insulin aspart  0-15 Units Subcutaneous TID WC  . insulin aspart  4 Units Subcutaneous TID WC  . insulin glargine  20 Units Subcutaneous QHS  . levothyroxine  150 mcg Oral QAC breakfast  . pantoprazole  40 mg Oral Daily  . rosuvastatin  40 mg Oral Daily  . sodium chloride flush  3 mL Intravenous Q12H  . torsemide  40 mg Oral Daily  . vitamin B-12  500 mcg Oral Daily   Continuous Infusions: . sodium chloride     PRN Meds: sodium chloride, acetaminophen **OR** acetaminophen, diclofenac sodium, levalbuterol, morphine injection, nitroGLYCERIN, ondansetron **OR** ondansetron (ZOFRAN) IV, polyethylene glycol, sodium chloride flush, traMADol   Vital Signs    Vitals:   05/03/17 1654 05/03/17 1731 05/03/17 2102 05/04/17 0631  BP: (!) 125/54 120/60 112/61 133/64  Pulse: (!) 59 60 60 75  Resp: 18 18 18 18   Temp:   (!) 97.5 F (36.4 C) (!) 97.5 F (36.4 C)  TempSrc:   Oral Oral  SpO2:   98% 100%  Weight:    91.4 kg (201 lb 6.4 oz)  Height:        Intake/Output Summary (Last 24 hours) at 05/04/2017 0825 Last data filed at 05/04/2017 6734 Gross per 24 hour  Intake 480 ml  Output 1150 ml  Net -670 ml   Filed Weights   05/02/17 0634 05/03/17 0539 05/04/17 0631  Weight: 93 kg (205 lb 1.6 oz) 90.2 kg (198 lb 12.8 oz) 91.4 kg (201 lb 6.4 oz)    Telemetry    AV paced - Personally Reviewed  ECG    No new tracings - Personally Reviewed  Physical Exam   GEN: No acute distress.   Neck: No JVD Cardiac: RRR, no murmurs, rubs, or gallops.    Respiratory: Clear to auscultation bilaterally. GI: Soft, nontender, non-distended  MS: No edema; No deformity. Neuro:  Nonfocal  Psych: Normal affect  Skin:  Right wrist cath site appears well healing  Labs    Chemistry Recent Labs  Lab 05/02/17 0433 05/03/17 0723 05/04/17 0519  NA 138 139 139  K 3.5 3.2* 4.0  CL 108 105 109  CO2 21* 21* 19*  GLUCOSE 138* 162* 168*  BUN 15 16 13   CREATININE 1.41* 1.56* 1.32*  CALCIUM 9.1 9.0 9.0  PROT 5.8* 6.0* 5.7*  ALBUMIN 2.7* 2.9* 2.8*  AST 39 38 48*  ALT 54 50 50  ALKPHOS 378* 392* 361*  BILITOT 0.9 0.8 0.9  GFRNONAA 35* 31* 38*  GFRAA 41* 36* 44*  ANIONGAP 9 13 11      Hematology Recent Labs  Lab 05/02/17 0433 05/03/17 0723 05/04/17 0519  WBC 6.4 7.1 5.9  RBC 4.29 4.74 4.43  HGB 12.1 13.5 12.3  HCT 37.0 41.0 38.4  MCV 86.2 86.5 86.7  MCH 28.2 28.5 27.8  MCHC 32.7 32.9 32.0  RDW 15.3 15.7* 15.7*  PLT 188 210 211    Cardiac Enzymes Recent Labs  Lab 04/28/17 2228  04/29/17 0051 04/29/17 0429 04/29/17 0928  TROPONINI 0.03* 0.03* 0.03* <0.03    Recent Labs  Lab 04/28/17 1128  TROPIPOC 0.00     BNPNo results for input(s): BNP, PROBNP in the last 168 hours.   DDimer  Recent Labs  Lab 04/28/17 1637  DDIMER <0.27     Radiology    No results found.  Cardiac Studies   TTE 2/16 Study Conclusions  - Procedure narrative: Transthoracic echocardiography. Technically   difficult study with reduced echo windows. - Left ventricle: The cavity size was normal. Wall thickness was   increased in a pattern of mild LVH. Systolic function was normal.   The estimated ejection fraction was in the range of 50% to 55%.   Doppler parameters are consistent with abnormal left ventricular   relaxation (grade 1 diastolic dysfunction). The E/e&' ratio is   between 8-15, suggesting indeterminate LV filling pressure. - Aortic valve: Trileaflet. Sclerosis without stenosis. There was   trivial regurgitation. - Mitral  valve: Mildly thickened leaflets . There was trivial   regurgitation. - Left atrium: The atrium was normal in size. - Inferior vena cava: The vessel was normal in size. The   respirophasic diameter changes were in the normal range (>= 50%),   consistent with normal central venous pressure.  Impressions:  - Compared to a prior study in 10/2016, there has been no   significant change.  RIGHT/LEFT HEART CATH AND CORONARY ANGIOGRAPHY 2/20  Conclusion:  Relatively normal right heart cath pressures with only mildly elevated LVEDP/PCP.  Prox LAD to Mid LAD lesion is 45% stenosed. Mid LAD lesion is 40% stenosed. Neither lesion was flow-limiting  Lat 2nd Diag lesion is 70% stenosed. Vessel is way too small for PCI  Previously placed Ost 1st Mrg stent (unknown type) is widely patent.   Angiographically moderate disease in the mid and distal LAD with patent stent to the circumflex.  Otherwise no significant disease. Mildly elevated LVEDP and PCWP with essentially normal pulmonary pressures. Adequately diuresed   Patient Profile     77 y.o. female 77 y.o. female with a history of coronary artery disease and acute on chronic diastolic congestive heart failure.  She presented with worsening shortness of breath.   Assessment & Plan    1.  Coronary artery disease Stented in 2014.   Patient had a right/left heart cath yesterday that noted prox LAD to Mid LAD lesion 45% stenoses neither lesion flow limiting.  Lat 2nd diag lesion 70% stenosed but too small for PCI.  Previous stent placed in Ost 1st Mrg was widely patent.  Also patent stent to the circumflex.   2.  Acute on chronic diastolic congestive heart failure Reports continued improvement in breathing.  Right/left heart cath showed mildly elevated LVEP and PCWP with essentially normal pulmonary pressures.  Will continue home diureses.   3.  Paroxysmal atrial fibrillation Patient presented in a.fib with a controlled VR and ventricular  pacing.  Currently rate controlled. Amiodarone was discontinued due to intolerance.  EP following.  Dofetilide may be an option however amiodarone levels need to be lower and may take up to 2 weeks before dofetilide can be started.  Eliquis has been restarted.    4.  History of Hypothyroidism, now Hyperthyroid TSH 0.198.  Medication will need to be titrated.  Patient states she will follow up with an endocrinologist.      4. AKI Patient's creatinine is 1.3, improved during admission.  Baseline appears 1.2.    For questions  or updates, please contact [place] Please consult [email or web address] for contact info under Cardiology/STEMI.      Signed, Boyd Kerbs, DO  05/04/2017, 8:25 AM     Attending Note:   The patient was seen and examined.  Agree with assessment and plan as noted above.  Changes made to the above note as needed.  Patient seen and independently examined with  Kalman Shan , DO .   We discussed all aspects of the encounter. I agree with the assessment and plan as stated above.  1.  Chest discomfort: Shanyia had cardiac catheterization yesterday which revealed mild to moderate coronary artery irregularities.  She had a tight stenosis and a very small branch that is way too small for PCI.  She is feeling better.  2.  History of chronic diastolic congestive heart failure: The patient was volume depleted on admission.  Her creatinine and BUN were elevated.  We allowed her to rehydrate and give her some IV fluids prior to her cath.  At the time of cath she was only very minimally volume overloaded. She had previously been placed on torsemide 60 mg with an increase to 80 mg.  I think that she will do well on torsemide 40 mg a day.  She will need an appointment with Dr. Caryl Comes or his physician's assistant/nurse practitioner in about 1 week. We should draw a basic metabolic profile at that time.   3.  Atrial fibrillation: The amiodarone has been stopped.  The plan is to  start her on T consent.  She will need to have her amiodarone continue to wash out further.  This is being followed by  EP     I have spent a total of 40 minutes with patient reviewing hospital  notes , telemetry, EKGs, labs and examining patient as well as establishing an assessment and plan that was discussed with the patient. > 50% of time was spent in direct patient care.    Thayer Headings, Brooke Bonito., MD, [facility] 05/04/2017, 12:23 PM [address]  [place] Pager [phone]

## 2017-05-04 NOTE — Discharge Summary (Signed)
Physician Discharge Summary  Dawn Garcia VEL:[id number] DOB: 06/06/1940 DOA: 04/28/2017  PCP: Glean Hess, MD  Admit date: 04/28/2017 Discharge date: 05/04/2017  Time spent: 45 minutes  Recommendations for Outpatient Follow-up:  Patient will be discharged to home.  Patient will need to follow up with primary care provider within one week of discharge, repeat CMP and CBC.  Follow up with cardiology. Patient should continue medications as prescribed.  Patient should follow a heart healthy diet.   Discharge Diagnoses:  Acute dyspnea/dyspnea on exertion Acute on chronic diastolic heart failure Chest pain Acute kidney injury on CKD, stage III Paroxysmal atrial fibrillation Diabetes mellitus, type II with hyperglycemia Hypothyroidism Obstructive sleep apnea Hyperlipidemia Sick sinus syndrome COPD Hypokalemia Abnormal LFTs  Discharge Condition: stable  Diet recommendation: heart healthy  Filed Weights   05/02/17 0634 05/03/17 0539 05/04/17 0631  Weight: 93 kg (205 lb 1.6 oz) 90.2 kg (198 lb 12.8 oz) 91.4 kg (201 lb 6.4 oz)    History of present illness:  on 04/28/2017 by Dr. Garth Bigness Hissongis a 78 y.o.femalewith medical history significant ofPAF, CAD, Diastolic CHF, GERD, COPD, HLD, SSS s/p Pacer, Iatrogenic Hypothyroidism from Thyroid Ablation and other comorbidities who presents with a 2 week progressive worsening dyspnea and especially with exertion. States she feels like this when she goes into Atrial Fibrillation and has had an ablation in the past. Was recently evaluated by EP Cardiology by Dr. Caryl Comes yesterday and Dr. Curt Bears today and was sent to the ED for evaluation given concern that SOB maybe something else besides PAF. She endorses no leg swelling and states she was recently taken off of Amiodarone 2 days ago. Has been having intermittent Chest Pressure that is left side associated with this SOB. Admits to being Lightheaded and sometimes dizzy  with these episodes and also has tremendous fatigue and weakness. No Nausea or vomting. [facility] was called to evaluate and admit patient for Acute Dyspnea/Dyspnea on Exertion and CP r/o ACS. Cardiology had already evaluated the patient.  Hospital Course:  Acute dyspnea/dyspnea on exertion -Patient has had progressively worsening dyspnea over the last several weeks, question of is related to PAF versus COPD -Respiratory viral panel and  influenza PCR negative -Cardiology check d-dimer which was less than 0.27, VQ scan not obtained -CT chest showed no acute cardiopulmonary disease.  No evidence of interstitial lung disease. Mild postinfectious postinflammatory scarring in the mid to lower lungs which is stable. Three-vessel coronary arthrosclerosis. Stable borderline prominent main pulmonary artery. -Continue breathing treatments and Mucinex. -Echocardiogram listed below -Cardiology consulted and appreciated, plan for right and left heart catheterization: Moderate disease in the mid to distal LAD with patent stent to the circumflex.  Otherwise no significant disease.  Mildly elevated LVEDP and PCWP with essentially normal pulmonary pressures.  Adequately diuresed.  Acute on chronic diastolic heart failure -Patient was given some diuresis however held due to worsening renal function -Continue to monitor intake and output, daily weights -Cardiology recommended torsemide 40 mg daily with outpatient follow-up in 1 week  Chest pain -With history of coronary artery disease -Has been ongoing and intermittent -Possibly secondary to atrial fibrillation -Troponins were cycled and found to be flat x3 sets EKG reviewed and found to be in sinus rhythm -Echocardiogram showed an EF of 2585%, grade 1 diastolic dysfunction  -Cardiology consulted and appreciated, Plan for left and right heart catheterization later today  Acute kidney injury on CKD, stage III -Creatinine today 1.3 -Baseline creatinine 1.1-1.3  (creatinine had spiked up to  2.13 on 04/29/2017) -Continue to monitor BMP  Paroxysmal atrial fibrillation -Patient was noted to have atrial fibrillation when device was interrogated as an outpatient however she converted to normal sinus rhythm -Continue Eliquis for anticoagulation -Amiodarone discontinued -Cardiology planning to use Tikosyn however waiting for amiodarone to washout.  Will follow-up with patient as an outpatient.  Diabetes mellitus, type II with hyperglycemia -Metformin and Jardiance held -Hemoglobin A1c 9.4 -Continue Lantus, insulin sliding scale, pre-meal insulin with CBG monitoring -Diabetes coordinator following  Hypothyroidism -Status post radioactive thyroid ablation -Prior to admission, TSH 0.198, T3 1.8, free T4 3.54 -Patient was also taking biotin as an outpatient along with amiodarone therefore possibly displacing patient's thyroid hormone -Patient should have repeat thyroid function studies as an outpatient -Continue Synthroid and follow-up with Dr. Buddy Duty  Obstructive sleep apnea -Continue CPAP  Hyperlipidemia -Continue statin  Sick sinus syndrome -Status post pacemaker placement  COPD -Appears to be stable, no wheezing noted on examination -Continue nebulizer treatments as needed -CT chest as stated above  Hypokalemia -resolved -Possibly secondary to diuresis  Abnormal LFTs -Possibly due to hypoperfusion in setting of atrial fibrillation -Right upper quadrant ultrasound showed no acute findings.  Mild hepatic steatosis without focal mass. -Hepatitis panel unremarkable, LFTs do appear to be improving  Consultants Cardiology  Procedures  Echocardiogram Right upper quadrant ultrasound  Discharge Exam: Vitals:   05/03/17 2102 05/04/17 0631  BP: 112/61 133/64  Pulse: 60 75  Resp: 18 18  Temp: (!) 97.5 F (36.4 C) (!) 97.5 F (36.4 C)  SpO2: 98% 100%   Patient examined on day of discharge.  Feels breathing has improved and  back to baseline.  Currently denies any chest pain, abdominal pain, nausea or vomiting, diarrhea constipation, dizziness or headache.   General: Well developed, well nourished, NAD, appears stated age  86: NCAT, mucous membranes moist.  Neck: Supple, no JVD  Cardiovascular: S1 S2 auscultated, no murmur, RRR  Respiratory: Clear to auscultation bilaterally with equal chest rise  Abdomen: Soft, nontender, nondistended, + bowel sounds  Extremities: warm dry without cyanosis clubbing or edema  Neuro: AAOx3, nonfocal  Psych: appropriate mood and affect  Discharge Instructions Discharge Instructions    AMB Referral to [facility]   Complete by:  As directed    Please assign patient to pharmacy for post hospital medication management needs for diabetes.  Patient's A1C went up to 9.4%. Patient did not feel she needed a Health Coach at this time.     For questions, please contact:  Natividad Brood, RN BSN [facility] Liaison  [phone] business mobile phone Toll free office [phone]   Reason for consult:  Patient wants phamacy assistance for Medication management to keep out of donut, referred by Diabetes Coord as well   Diagnoses of:  Diabetes   Expected date of contact:  1-3 days (reserved for hospital discharges)   Ambulatory referral to Nutrition and Diabetic Education   Complete by:  As directed    Discharge instructions   Complete by:  As directed    Patient will be discharged to home.  Patient will need to follow up with primary care provider within one week of discharge, repeat CMP and CBC.  Follow up with cardiology. Patient should continue medications as prescribed.  Patient should follow a heart healthy diet.     Allergies as of 05/04/2017      Reactions   Demerol [meperidine] Anaphylaxis   Tolerated Fentanyl 11/10/15   Multaq [dronedarone] Diarrhea  Sulfa Antibiotics Anaphylaxis   Tetracyclines & Related Other (See Comments),  Swelling   Made nose, lips,  And tongue itchy Made nose, lips,  And tongue itchy   Metformin And Related Diarrhea      Medication List    STOP taking these medications   Biotin 1000 MCG Chew     TAKE these medications   albuterol 108 (90 Base) MCG/ACT inhaler Commonly known as:  PROVENTIL HFA;VENTOLIN HFA Inhale 2 puffs into the lungs every 6 (six) hours as needed for wheezing or shortness of breath.   apixaban 5 MG Tabs tablet Commonly known as:  ELIQUIS Take 1 tablet (5 mg total) 2 (two) times daily by mouth.   b complex vitamins tablet Take 1 tablet by mouth daily.   docusate sodium 100 MG capsule Commonly known as:  COLACE Take 100 mg by mouth daily.   empagliflozin 10 MG Tabs tablet Commonly known as:  JARDIANCE Take 10 mg by mouth daily.   ICAPS AREDS 2 Caps Take 1 capsule by mouth 2 (two) times daily.   levothyroxine 150 MCG tablet Commonly known as:  SYNTHROID, LEVOTHROID Take 1 tablet (150 mcg total) by mouth daily before breakfast.   metFORMIN 500 MG tablet Commonly known as:  GLUCOPHAGE Take 500 mg by mouth 2 (two) times daily with a meal.   NITROSTAT 0.4 MG SL tablet Generic drug:  nitroGLYCERIN Place 0.4 mg under the tongue every 5 (five) minutes as needed for chest pain.   pantoprazole 40 MG tablet Commonly known as:  PROTONIX Take 1 tablet (40 mg total) by mouth daily.   potassium chloride 8 MEQ tablet Commonly known as:  KLOR-CON TAKE ONE TABLET BY MOUTH TWICE DAILY   rosuvastatin 40 MG tablet Commonly known as:  CRESTOR Take 1 tablet (40 mg total) by mouth daily.   torsemide 20 MG tablet Commonly known as:  DEMADEX Take 60 mg by mouth daily.   vitamin B-12 500 MCG tablet Commonly known as:  CYANOCOBALAMIN Take 500 mcg by mouth daily.   Vitamin D3 5000 units Tabs Take 1 tablet by mouth every morning.      Allergies  Allergen Reactions  . Demerol [Meperidine] Anaphylaxis    Tolerated Fentanyl 11/10/15  . Multaq [Dronedarone]  Diarrhea  . Sulfa Antibiotics Anaphylaxis  . Tetracyclines & Related Other (See Comments) and Swelling    Made nose, lips,  And tongue itchy Made nose, lips,  And tongue itchy  . Metformin And Related Diarrhea   Follow-up Information    [facility] Follow up on 05/18/2017.   Specialty:  Cardiology Why:  11:30AM Contact information: [address] [id number] Danice Goltz [place]       Glean Hess, MD. Schedule an appointment as soon as possible for a visit in 1 week(s).   Specialty:  Internal Medicine Why:  Hospital follow up Contact information: [place] [address] [phone]            The results of significant diagnostics from this hospitalization (including imaging, microbiology, ancillary and laboratory) are listed below for reference.    Significant Diagnostic Studies: Dg Chest 2 View  Result Date: 04/27/2017 CLINICAL DATA:  Shortness of Breath EXAM: CHEST  2 VIEW COMPARISON:  March 25, 2017 FINDINGS: There is scarring in the left base region. Lungs elsewhere are clear. Heart size and pulmonary vascularity are normal. Pacemaker leads are attached to the right atrium and right ventricle. There is  aortic atherosclerosis. No adenopathy. Patient is status post total shoulder replacement on the right. IMPRESSION: Scarring left base. No edema or consolidation. Heart size normal. Pacemaker leads attached to right atrium and right ventricle. Aortic atherosclerosis present. No adenopathy evident. Aortic Atherosclerosis (ICD10-I70.0). Electronically Signed   By: Lowella Grip III M.D.   On: 04/27/2017 15:22   Ct Chest High Resolution  Result Date: 04/30/2017 CLINICAL DATA:  Dyspnea.  Inpatient. EXAM: CT CHEST WITHOUT CONTRAST TECHNIQUE: Multidetector CT imaging of the chest was performed following the standard protocol without intravenous contrast. High resolution imaging of the  lungs, as well as inspiratory and expiratory imaging, was performed. COMPARISON:  04/27/2017 chest radiograph.  12/23/2015 chest CT. FINDINGS: Cardiovascular: Stable top-normal heart size. Stable 2 lead left subclavian pacemaker with lead tips in the right atrium and right ventricular apex. Stable trace pericardial effusion/thickening. Three-vessel coronary atherosclerosis. Atherosclerotic nonaneurysmal thoracic aorta. Stable borderline prominent main pulmonary artery (3.3 cm diameter). Mediastinum/Nodes: Apparent thyroidectomy. Unremarkable esophagus. No pathologically enlarged axillary, mediastinal or gross hilar lymph nodes, noting limited sensitivity for the detection of hilar adenopathy on this noncontrast study. Lungs/Pleura: No pneumothorax. No pleural effusion. No acute consolidative airspace disease, lung masses or significant pulmonary nodules. Stable small parenchymal bands in the medial segment right middle lobe, dependent left lower lobe and medial lower lobes bilaterally, compatible with mild postinfectious/postinflammatory scarring. No significant regions of subpleural reticulation, ground-glass attenuation, traction bronchiectasis, architectural distortion or frank honeycombing. No significant air trapping on the expiration sequence. Upper abdomen: No acute abnormality. Musculoskeletal: No aggressive appearing focal osseous lesions. Moderate to marked thoracic spondylosis. Partially visualized right total shoulder arthroplasty. IMPRESSION: 1. No acute cardiopulmonary disease. No evidence of interstitial lung disease. 2. Mild postinfectious/postinflammatory scarring in the mid to lower lungs, stable. 3. Three-vessel coronary atherosclerosis. 4. Stable borderline prominent main pulmonary artery. Aortic Atherosclerosis (ICD10-I70.0). Electronically Signed   By: Ilona Sorrel M.D.   On: 04/30/2017 16:02   US Abdomen Limited Ruq  Result Date: 04/29/2017 CLINICAL DATA:  Abnormal LFTs.  Previous  cholecystectomy. EXAM: ULTRASOUND ABDOMEN LIMITED RIGHT UPPER QUADRANT COMPARISON:  None. FINDINGS: Gallbladder: Previous cholecystectomy. Common bile duct: Diameter: 8.0 mm. Liver: Findings suggesting mild hepatic steatosis without focal mass. Portal vein is patent on color Doppler imaging with normal direction of blood flow towards the liver. IMPRESSION: Previous cholecystectomy.  No acute findings. Mild hepatic steatosis without focal mass. Electronically Signed   By: Marin Olp M.D.   On: 04/29/2017 13:55    Microbiology: Recent Results (from the past 240 hour(s))  Respiratory Panel by PCR     Status: None   Collection Time: 04/28/17  6:25 PM  Result Value Ref Range Status   Adenovirus NOT DETECTED NOT DETECTED Final   Coronavirus 229E NOT DETECTED NOT DETECTED Final   Coronavirus HKU1 NOT DETECTED NOT DETECTED Final   Coronavirus NL63 NOT DETECTED NOT DETECTED Final   Coronavirus OC43 NOT DETECTED NOT DETECTED Final   Metapneumovirus NOT DETECTED NOT DETECTED Final   Rhinovirus / Enterovirus NOT DETECTED NOT DETECTED Final   Influenza A NOT DETECTED NOT DETECTED Final   Influenza B NOT DETECTED NOT DETECTED Final   Parainfluenza Virus 1 NOT DETECTED NOT DETECTED Final   Parainfluenza Virus 2 NOT DETECTED NOT DETECTED Final   Parainfluenza Virus 3 NOT DETECTED NOT DETECTED Final   Parainfluenza Virus 4 NOT DETECTED NOT DETECTED Final   Respiratory Syncytial Virus NOT DETECTED NOT DETECTED Final   Bordetella pertussis NOT DETECTED NOT DETECTED Final   Chlamydophila  pneumoniae NOT DETECTED NOT DETECTED Final   Mycoplasma pneumoniae NOT DETECTED NOT DETECTED Final    Comment: Performed at [facility], [place] [address] [place], [place] [address]     Labs: Basic Metabolic Panel: Recent Labs  Lab 04/29/17 0429 04/30/17 0736 05/01/17 0559 05/02/17 0433 05/03/17 0723 05/04/17 0519  NA 135 137 141 138 139 139  K 3.0* 3.7 4.1 3.5 3.2* 4.0  CL 95* 104 108 108 105 109  CO2  23 21* 23 21* 21* 19*  GLUCOSE 197* 203* 171* 138* 162* 168*  BUN 25* 25* 21* 15 16 13   CREATININE 2.13* 1.78* 1.61* 1.41* 1.56* 1.32*  CALCIUM 8.9 9.4 9.3 9.1 9.0 9.0  MG 2.2 2.2 2.0 1.9 1.9  --   PHOS 4.2 3.4 3.5 3.8 4.1  --    Liver Function Tests: Recent Labs  Lab 04/30/17 0736 05/01/17 0559 05/02/17 0433 05/03/17 0723 05/04/17 0519  AST 39 36 39 38 48*  ALT 66* 56* 54 50 50  ALKPHOS 387* 365* 378* 392* 361*  BILITOT 0.9 0.5 0.9 0.8 0.9  PROT 5.9* 5.7* 5.8* 6.0* 5.7*  ALBUMIN 2.8* 2.8* 2.7* 2.9* 2.8*   No results for input(s): LIPASE, AMYLASE in the last 168 hours. No results for input(s): AMMONIA in the last 168 hours. CBC: Recent Labs  Lab 04/29/17 0429 04/30/17 0736 05/01/17 0559 05/02/17 0433 05/03/17 0723 05/04/17 0519  WBC 8.1 7.2 6.0 6.4 7.1 5.9  NEUTROABS 5.2 4.6 3.4 3.9 4.2  --   HGB 13.9 13.5 12.5 12.1 13.5 12.3  HCT 42.4 41.2 38.6 37.0 41.0 38.4  MCV 85.7 86.2 87.5 86.2 86.5 86.7  PLT 192 197 183 188 210 211   Cardiac Enzymes: Recent Labs  Lab 04/28/17 1949 04/28/17 2228 04/29/17 0051 04/29/17 0429 04/29/17 0928  TROPONINI <0.03 0.03* 0.03* 0.03* <0.03   BNP: BNP (last 3 results) Recent Labs    04/25/17 1149  BNP 57.0    ProBNP (last 3 results) No results for input(s): PROBNP in the last 8760 hours.  CBG: Recent Labs  Lab 05/03/17 1110 05/03/17 1635 05/03/17 2146 05/04/17 0736 05/04/17 1122  GLUCAP 178* 143* 164* 154* 299*       Signed:  Clarion Mooneyhan  [facility] 05/04/2017, 12:48 PM

## 2017-05-04 NOTE — Progress Notes (Signed)
Progress Note  Patient Name: Dawn Garcia Date of Encounter: 05/04/2017  Primary Cardiologist: Dr. Caryl Comes  Subjective   Without chest pain or shortness of breath,  Breathing doing quite well Cath results reviewed, and not surprisingly her LVEDP was low, although she has diuresed about 6 lbs.      Inpatient Medications    Scheduled Meds: . apixaban  5 mg Oral BID  . B-complex with vitamin C  1 tablet Oral Daily  . cholecalciferol  5,000 Units Oral q morning - 10a  . docusate sodium  100 mg Oral Daily  . guaiFENesin  600 mg Oral BID  . insulin aspart  0-15 Units Subcutaneous TID WC  . insulin aspart  4 Units Subcutaneous TID WC  . insulin glargine  20 Units Subcutaneous QHS  . levothyroxine  150 mcg Oral QAC breakfast  . pantoprazole  40 mg Oral Daily  . rosuvastatin  40 mg Oral Daily  . sodium chloride flush  3 mL Intravenous Q12H  . torsemide  40 mg Oral Daily  . vitamin B-12  500 mcg Oral Daily   Continuous Infusions: . sodium chloride     PRN Meds: sodium chloride, acetaminophen **OR** acetaminophen, diclofenac sodium, levalbuterol, morphine injection, nitroGLYCERIN, ondansetron **OR** ondansetron (ZOFRAN) IV, polyethylene glycol, sodium chloride flush, traMADol   Vital Signs    Vitals:   05/03/17 1654 05/03/17 1731 05/03/17 2102 05/04/17 0631  BP: (!) 125/54 120/60 112/61 133/64  Pulse: (!) 59 60 60 75  Resp: 18 18 18 18   Temp:   (!) 97.5 F (36.4 C) (!) 97.5 F (36.4 C)  TempSrc:   Oral Oral  SpO2:   98% 100%  Weight:    201 lb 6.4 oz (91.4 kg)  Height:        Intake/Output Summary (Last 24 hours) at 05/04/2017 1101 Last data filed at 05/04/2017 0946 Gross per 24 hour  Intake 840 ml  Output 1150 ml  Net -310 ml   Filed Weights   05/02/17 0634 05/03/17 0539 05/04/17 0631  Weight: 205 lb 1.6 oz (93 kg) 198 lb 12.8 oz (90.2 kg) 201 lb 6.4 oz (91.4 kg)    Telemetry    AV pacing wth some afib  ECG       Physical Exam   Well developed and  nourished in no acute distress HENT normal Neck supple with JVP-flat Clear Regular rate and rhythm, no murmurs or gallops Abd-soft with active BS No Clubbing cyanosis edema Skin-warm and dry A & Oriented  Grossly normal sensory and motor function   Labs    Chemistry Recent Labs  Lab 05/02/17 0433 05/03/17 0723 05/04/17 0519  NA 138 139 139  K 3.5 3.2* 4.0  CL 108 105 109  CO2 21* 21* 19*  GLUCOSE 138* 162* 168*  BUN 15 16 13   CREATININE 1.41* 1.56* 1.32*  CALCIUM 9.1 9.0 9.0  PROT 5.8* 6.0* 5.7*  ALBUMIN 2.7* 2.9* 2.8*  AST 39 38 48*  ALT 54 50 50  ALKPHOS 378* 392* 361*  BILITOT 0.9 0.8 0.9  GFRNONAA 35* 31* 38*  GFRAA 41* 36* 44*  ANIONGAP 9 13 11      Hematology Recent Labs  Lab 05/02/17 0433 05/03/17 0723 05/04/17 0519  WBC 6.4 7.1 5.9  RBC 4.29 4.74 4.43  HGB 12.1 13.5 12.3  HCT 37.0 41.0 38.4  MCV 86.2 86.5 86.7  MCH 28.2 28.5 27.8  MCHC 32.7 32.9 32.0  RDW 15.3 15.7* 15.7*  PLT 188 210  211    Cardiac Enzymes Recent Labs  Lab 04/28/17 2228 04/29/17 0051 04/29/17 0429 04/29/17 0928  TROPONINI 0.03* 0.03* 0.03* <0.03    Recent Labs  Lab 04/28/17 1128  TROPIPOC 0.00     BNPNo results for input(s): BNP, PROBNP in the last 168 hours.   DDimer  Recent Labs  Lab 04/28/17 1637  DDIMER <0.27     Radiology    No results found.  Cardiac Studies   04/29/17: TTE Study Conclusions - Procedure narrative: Transthoracic echocardiography. Technically   difficult study with reduced echo windows. - Left ventricle: The cavity size was normal. Wall thickness was   increased in a pattern of mild LVH. Systolic function was normal.   The estimated ejection fraction was in the range of 50% to 55%.   Doppler parameters are consistent with abnormal left ventricular   relaxation (grade 1 diastolic dysfunction). The E/e&' ratio is   between 8-15, suggesting indeterminate LV filling pressure. - Aortic valve: Trileaflet. Sclerosis without stenosis.  There was   trivial regurgitation. - Mitral valve: Mildly thickened leaflets . There was trivial   regurgitation. - Left atrium: The atrium was normal in size. - Inferior vena cava: The vessel was normal in size. The   respirophasic diameter changes were in the normal range (>= 50%),   consistent with normal central venous pressure. Impressions: - Compared to a prior study in 10/2016, there has been no   significant change.  Patient Profile     77 y.o. female 77 y.o. female with a history of remote stenting (date/vessel unknown), PAF with reported ablation previously, SSS s/p PPM, COPD, DM, HLD, CKD III, hypothyroidism (thyroid removal).   Admitted with persistent and significant SOB of unclear etiology.   AF/AAD Hx: Previously on dronedarone but stopped due to diarrhea.  She had been started on amiodarone 03/2017 for recurrent AF, stopped Apr 27, 2017 with recurrent AFib (felt to have failed drug)  Device information: SJM dual chamber PPM, implanted 11/10/15 Device interrogated 04/18/17 Battery and lead measurements were good, 19% AF burden  Assessment & Plan    1.  Coronary artery disease       Stented in 2014.         2.  Acute on chronic diastolic congestive heart failure      3.  Paroxysmal atrial fibrillation   4.  AKI      5. Hyperthyroid    6. Abn LFTs     Medicine following, improving, no clear etiology is noted   Have reviewed thryoid labs with pt ( who is averse to any changes) and dr Forde Dandy, who is in agreement with pt.  So we would leave it there  Her dyspnea remains a challenge.  I suspect the atrial fib with loss of atrial contractility seems to be the culprit  I am not sanguine that she will do well without rhythm control.  We will have her amiodarone level checked in a couple of weeks and hopefully will clear rapidly we can try her on dofetilide.  We will also plan referral for catheter ablation

## 2017-05-04 NOTE — Progress Notes (Signed)
Pt with home CPAP. RT will assist if needed.

## 2017-05-04 NOTE — Discharge Instructions (Signed)
You have an appointment set up with the [facility].  Multiple studies have shown that being followed by a dedicated atrial fibrillation clinic in addition to the standard care you receive from your other physicians improves health. We believe that enrollment in the atrial fibrillation clinic will allow [place] to better care for you.   The phone number to the [facility] is [phone]. The clinic is staffed Monday through Friday from 8:30am to 5pm.  Parking Directions: The clinic is located in the Heart and Vascular Building connected to [facility]. 1)From [address] turn on to [address] and go to the 3rd entrance  (Heart and Vascular entrance) on the right. 2)Look to the right for Heart &Vascular Parking Garage. 3)A code for the entrance for March is 9002   4)Take the elevators to the 1st floor. Registration is in the room with the glass walls at the end of the hallway.  If you have any trouble parking or locating the clinic, please dont hesitate to call [phone].

## 2017-05-04 NOTE — Progress Notes (Addendum)
Reviewed with Dr. Caryl Comes. OK from EP perspective for discharge when ready from general cardiology and medicine services.    AFib clinic follow up has been arranged for repeat amiodarone level and plans/timing for Tikosyn initiation.  Tommye Standard, PA-C  See separate note

## 2017-05-04 NOTE — Care Management Note (Signed)
Case Management Note  Patient Details  Name: Dawn Garcia MRN: [id number] Date of Birth: 26-Jun-1940  Subjective/Objective:   Chest Pain                Action/Plan: Patient lives at home with spouse; PCP: Glean Hess, MD; has private insurance with Medicare; pharmacy of choice is [facility]; No DME; [place] choices offered, pt chose [facility]; [place] with [facility] called for arrangements.  Expected Discharge Date:  05/04/17               Expected Discharge Plan:  [place]  Discharge planning Services  CM Consult  Choice offered to:  Patient  HH Arranged:  PT, OT [place] Agency:  [facility] (formerly [facility])  Status of Service:  In process, will continue to follow  Dawn Garcia [phone] 05/04/2017, 2:42 PM

## 2017-05-04 NOTE — Progress Notes (Signed)
Physical Therapy Treatment Patient Details Name: Dawn Garcia MRN: [id number] DOB: [birth year] Today's Date: 05/04/2017    History of Present Illness Pt is a 77 y.o. female admitted secondary to SOB and weakness. PMH including but not limited to a-fib, CAD, CHF, COPD, DM, hx of MI and pacemaker placement in 2017.    PT Comments    Pt performing bed mobility and transfers without assistance.  Pt performed short bouts of gait training to toilet and back.  Pt refused further gait training as she reports she feels to full after eating and unsure if she will be tolerant to stair training.  Pt with plans to d/c home this afternoon.  Will f/u in pm if patient is still here for stair training as able.  Pt reports she does not have stairs to enter home so this would not be a huge priority.    Follow Up Recommendations  Home health PT     Equipment Recommendations  None recommended by PT    Recommendations for Other Services       Precautions / Restrictions Precautions Precautions: Fall Restrictions Weight Bearing Restrictions: No    Mobility  Bed Mobility Overal bed mobility: Modified Independent             General bed mobility comments: Pt supine in bed but able to pull herself to [facility] without assistance.  HOB elevated.   Transfers Overall transfer level: Modified independent   Transfers: Sit to/from Stand Sit to Stand: Modified independent (Device/Increase time)         General transfer comment: No assistance needed good technique performed.  Pt performed from bed and from commode.    Ambulation/Gait Ambulation/Gait assistance: Supervision Ambulation Distance (Feet): 10 Feet(x2) Assistive device: None Gait Pattern/deviations: Step-through pattern;Decreased stride length Gait velocity: decreased Gait velocity interpretation: Below normal speed for age/gender General Gait Details: Pt holding to counter in room.  Able to perform short trial to bathroom and back  before reporting she feels to full to progress gait and perform stair training.     Stairs            Wheelchair Mobility    Modified Rankin (Stroke Patients Only)       Balance Overall balance assessment: Needs assistance   Sitting balance-Leahy Scale: Good       Standing balance-Leahy Scale: Fair                              Cognition Arousal/Alertness: Awake/alert Behavior During Therapy: WFL for tasks assessed/performed Overall Cognitive Status: Within Functional Limits for tasks assessed                                        Exercises      General Comments        Pertinent Vitals/Pain Pain Assessment: No/denies pain    Home Living                      Prior Function            PT Goals (current goals can now be found in the care plan section) Acute Rehab PT Goals Patient Stated Goal: build up activity tolerance Potential to Achieve Goals: Good Progress towards PT goals: Progressing toward goals    Frequency    Min 3X/week  PT Plan Current plan remains appropriate    Co-evaluation              AM-PAC PT "6 Clicks" Daily Activity  Outcome Measure  Difficulty turning over in bed (including adjusting bedclothes, sheets and blankets)?: None Difficulty moving from lying on back to sitting on the side of the bed? : None Difficulty sitting down on and standing up from a chair with arms (e.g., wheelchair, bedside commode, etc,.)?: None Help needed moving to and from a bed to chair (including a wheelchair)?: None Help needed walking in hospital room?: A Little Help needed climbing 3-5 steps with a railing? : A Little 6 Click Score: 22    End of Session   Activity Tolerance: Patient limited by fatigue Patient left: with call bell/phone within reach;with family/visitor present;in chair Nurse Communication: Mobility status PT Visit Diagnosis: Other abnormalities of gait and mobility (R26.89)      Time: 2229-7989 PT Time Calculation (min) (ACUTE ONLY): 9 min  Charges:  $Therapeutic Activity: 8-22 mins                    G CodesGovernor Rooks, PTA pager [phone]    Cristela Blue 05/04/2017, 1:01 PM

## 2017-05-05 ENCOUNTER — Telehealth: Payer: Self-pay

## 2017-05-05 NOTE — Telephone Encounter (Signed)
TOC #1. Called pt to f/u after d/c from [facility] on 05/04/17. Also wanted to confirm her hosp f/u appt w/ [facility] on 05/16/17 @ 2:30pm. Discharge planning includes the following:  - Repeat CMP and CBC - Monitor daily weights and I&O - Continue home meds - Change dosage of Demadex - Discontinue Biotin - Continue heart healthy, diabetic diet -  Continue HH As part of the TOC f/u call, I am also wanting to discuss/review the above information with the pt to ensure all of the above have been taken care of. LVM requesting returned call.

## 2017-05-08 ENCOUNTER — Telehealth: Payer: Self-pay | Admitting: *Deleted

## 2017-05-08 ENCOUNTER — Other Ambulatory Visit: Payer: Self-pay | Admitting: Pharmacist

## 2017-05-08 NOTE — Telephone Encounter (Signed)
-----   Message from Blain Pais sent at 05/08/2017 12:43 PM EST ----- Regarding: TCM/PH 9:15 3/5 DR. Caryl Comes

## 2017-05-08 NOTE — Telephone Encounter (Signed)
Patient contacted regarding discharge from [facility] on 05/07/17.   Patient understands to follow up with provider ? On 05/16/17 at 0900 at [facility].  Patient understands discharge instructions? Yes   Patient understands medications and regiment? Yes  Patient understands to bring all medications to this visit? Yes

## 2017-05-08 NOTE — Telephone Encounter (Signed)
TCM....  No answer. Left message to call back.

## 2017-05-08 NOTE — Telephone Encounter (Signed)
Pt returning our call °Please call back ° °

## 2017-05-08 NOTE — Patient Outreach (Signed)
[facility]) Care Management  05/08/2017  Dawn Garcia September 16, 1940 [id number]   77 year old female referred to [place] [facility] by [facility] inpatient liaison for transition of care medication reconciliation and diabetes management.  PMHx includes, but not limited to, PAF on chronic anticoagulation with reported ablation previously, CAD s/p prior stent to circumflex, diastolic HFpEF (EF 18-56% 2/'19), COPD, chronic kidney disease stage III, diabetes type II, SSS s/p pacer, hypothyroidism s/p ablation, HLD, and GERD.  Patient recently hospitalized for 2 weeks of progressive worsening dyspnea and intermittent chest pressure / pain of unclear etiology.   Noted amiodarone recently discontinued with plan for Tikosyn after washout period.   Insurance: [employer]  Successful call placed to [name] today. HIPAA identifiers verified. [name] is agreeable to [facility] pharmacy services.    Subjective:  "My atrial fibrillation has gotten worse over the last couple of months." "I have extreme weakness and SOB, seems like I have relapsed after I got home" "I was on insulin 4 years ago, and I think that I will be put back on it."   Objective:  Hemoglobin A1C 9.4 SCr 1.32 (baseline 1.1-1.3) -05/04/2017, CrCl 52 ml/min, eGFR = 38 ml/min  Medications Reviewed Today    Reviewed by Rudean Haskell, RPH (Pharmacist) on 05/08/17 at 1213  Med List Status: <None>  Medication Order Taking? Sig Documenting Provider Last Dose Status Informant  apixaban (ELIQUIS) 5 MG TABS tablet [id number] Yes Take 1 tablet (5 mg total) 2 (two) times daily by mouth. Deboraha Sprang, MD Taking Active Self  b complex vitamins tablet [id number] Yes Take 1 tablet by mouth daily. [provider] Taking Active Self  Cholecalciferol (VITAMIN D3) 5000 units TABS [id number] Yes Take 1 tablet by mouth every morning. [provider] Taking Active Self  docusate sodium (COLACE) 100 MG capsule [id number] Yes  Take 100 mg by mouth daily as needed.  [provider] Taking Active Self  empagliflozin (JARDIANCE) 10 MG TABS tablet [id number] Yes Take 10 mg by mouth daily. Glean Hess, MD Taking Active Self  levothyroxine (SYNTHROID, LEVOTHROID) 150 MCG tablet [id number] Yes Take 1 tablet (150 mcg total) by mouth daily before breakfast. Deboraha Sprang, MD Taking Active Self  metFORMIN (GLUCOPHAGE) 500 MG tablet [id number] Yes Take 500 mg by mouth 2 (two) times daily with a meal.  [provider] Taking Active Self  Multiple Vitamins-Minerals (ICAPS AREDS 2) CAPS [id number] Yes Take 1 capsule by mouth 2 (two) times daily. [provider] Taking Active Self  nitroGLYCERIN (NITROSTAT) 0.4 MG SL tablet [id number] No Place 0.4 mg under the tongue every 5 (five) minutes as needed for chest pain. [provider] Not Taking Active Self  pantoprazole (PROTONIX) 40 MG tablet [id number] Yes Take 1 tablet (40 mg total) by mouth daily. Glean Hess, MD Taking Active Self  potassium chloride (KLOR-CON) 8 MEQ tablet [id number] Yes TAKE ONE TABLET BY MOUTH TWICE DAILY Glean Hess, MD Taking Active Self  rosuvastatin (CRESTOR) 40 MG tablet [id number] Yes Take 1 tablet (40 mg total) by mouth daily. Minna Merritts, MD Taking Active Self  torsemide (DEMADEX) 20 MG tablet [id number] Yes Take 2 tablets (40 mg total) by mouth daily. Cristal Ford, DO Taking Active   vitamin B-12 (CYANOCOBALAMIN) 500 MCG tablet [id number] Yes Take 500 mcg by mouth daily. [provider] Taking Active Self         ASSESSMENT: Date Discharged from Hospital: 05/04/2017 Date Medication Reconciliation Performed: 05/08/2017  Medications Discontinued at Discharge:   Biotin  Patient was recently discharged from hospital and all medications have been reviewed  Drugs sorted by system:  Neurologic/Psychologic: none  Cardiovascular: apixaban, PRN NTG SL, rosuvastatin,  torsemide  Pulmonary/Allergy: none  Gastrointestinal: docusate, pantoprazole  Endocrine: empagliflozin, levothyroxine, metformin  Renal: none  Topical: none  Pain: none  Vitamins/Minerals: b complex, cholecalciferol, MVI, potassium, vitamin B12  Infectious Diseases: none  Miscellaneous: none  Duplications in therapy:  none Gaps in therapy:  HFpEF: Not on beta blocker due to fatigue, has pacemaker, no RASS agents possible due to AKI  COPD: No current treatment.  Per patient, she has not needed albuterol inhaler in > 1 year.      Medications to avoid in the elderly:  none Drug interactions: none Other issues noted:  Empagliflozin with eGFR = 38 ml/min:  Per PI, eGFR 30 - 45 ml/min: Discontinue therapy when eGFR persistently < 45 ml/min.    -Per patient, she will likely be changing to insulin therapy when she has office visit with PCP next week.  She was told that she should not be on empagliflozin any longer because of her renal function.    Medication assistance:  Per patient, estimated monthly income for household of 2 = $4200.  She reports that apixaban and empagliflozin are expensive and she recently paid $700 for 3 month supply of all her medications.  -Extra Help / LIS: Not eligible based on income -Empagliflozin PAP ([employer]): Not eligible based on income -Apixaban PAP ([employer]):  Not eligible based on income, also TROOP = 3% of estimated annual income (~$1500 for patient).  We reviewed that she may try to apply after meeting TROOP as income is borderline but no guarantee that application will be approved.  -Insulin PAPs: Reviewed possible PAPs but may not be eligible based on income.  Reviewed that [facility] has [employer] NPH insulin for $25/vial as an option if insulin started by PCP.    Patient stated she is going to make appointments to follow-up with her cardiologist.  She has an appointment with a new PCP, Dr. Khan, next week on March 5th and will  ave.  She may request a referral for an endocrinologist to help manage her diabetes.     Plan: [facility] pharmacy will close patient case at this time.  I provided patient with my phone number if she needs to reach out to me in the future regarding patient assistance or other medication related questions.  I will send patient and MD closure letter and alert [facility].    , PharmD, BCPS Clinical Pharmacist [facility] [phone]       

## 2017-05-10 ENCOUNTER — Telehealth: Payer: Self-pay

## 2017-05-10 NOTE — Telephone Encounter (Signed)
Called pt to sched AWV w/ [facility]. LVM requesting returned call.  

## 2017-05-14 DIAGNOSIS — I2510 Atherosclerotic heart disease of native coronary artery without angina pectoris: Secondary | ICD-10-CM | POA: Diagnosis not present

## 2017-05-14 DIAGNOSIS — E1122 Type 2 diabetes mellitus with diabetic chronic kidney disease: Secondary | ICD-10-CM | POA: Diagnosis not present

## 2017-05-14 DIAGNOSIS — I130 Hypertensive heart and chronic kidney disease with heart failure and stage 1 through stage 4 chronic kidney disease, or unspecified chronic kidney disease: Secondary | ICD-10-CM | POA: Diagnosis not present

## 2017-05-14 DIAGNOSIS — I480 Paroxysmal atrial fibrillation: Secondary | ICD-10-CM | POA: Diagnosis not present

## 2017-05-14 DIAGNOSIS — N183 Chronic kidney disease, stage 3 (moderate): Secondary | ICD-10-CM | POA: Diagnosis not present

## 2017-05-14 DIAGNOSIS — I5030 Unspecified diastolic (congestive) heart failure: Secondary | ICD-10-CM | POA: Diagnosis not present

## 2017-05-16 ENCOUNTER — Encounter: Payer: Self-pay | Admitting: Internal Medicine

## 2017-05-16 ENCOUNTER — Telehealth: Payer: Self-pay | Admitting: Internal Medicine

## 2017-05-16 ENCOUNTER — Ambulatory Visit (INDEPENDENT_AMBULATORY_CARE_PROVIDER_SITE_OTHER): Payer: Medicare Other | Admitting: Internal Medicine

## 2017-05-16 ENCOUNTER — Other Ambulatory Visit
Admission: RE | Admit: 2017-05-16 | Discharge: 2017-05-16 | Disposition: A | Discharge status: Home / Self Care | Payer: Medicare Other | Source: Ambulatory Visit | Attending: Internal Medicine | Admitting: Internal Medicine

## 2017-05-16 ENCOUNTER — Inpatient Hospital Stay: Payer: Medicare Other | Admitting: Internal Medicine

## 2017-05-16 VITALS — BP 118/64 | HR 60 | Ht 63.0 in | Wt 198.1 lb

## 2017-05-16 DIAGNOSIS — I495 Sick sinus syndrome: Secondary | ICD-10-CM

## 2017-05-16 DIAGNOSIS — E1122 Type 2 diabetes mellitus with diabetic chronic kidney disease: Secondary | ICD-10-CM | POA: Diagnosis not present

## 2017-05-16 DIAGNOSIS — E890 Postprocedural hypothyroidism: Secondary | ICD-10-CM | POA: Diagnosis not present

## 2017-05-16 DIAGNOSIS — E782 Mixed hyperlipidemia: Secondary | ICD-10-CM | POA: Diagnosis not present

## 2017-05-16 DIAGNOSIS — E876 Hypokalemia: Secondary | ICD-10-CM

## 2017-05-16 DIAGNOSIS — I480 Paroxysmal atrial fibrillation: Secondary | ICD-10-CM

## 2017-05-16 DIAGNOSIS — I2510 Atherosclerotic heart disease of native coronary artery without angina pectoris: Secondary | ICD-10-CM | POA: Diagnosis not present

## 2017-05-16 DIAGNOSIS — I4891 Unspecified atrial fibrillation: Secondary | ICD-10-CM | POA: Diagnosis not present

## 2017-05-16 DIAGNOSIS — Z950 Presence of cardiac pacemaker: Secondary | ICD-10-CM | POA: Diagnosis not present

## 2017-05-16 LAB — BASIC METABOLIC PANEL
ANION GAP: 12 (ref 5–15)
BUN: 18 mg/dL (ref 6–20)
CALCIUM: 8.9 mg/dL (ref 8.9–10.3)
CO2: 27 mmol/L (ref 22–32)
Chloride: 100 mmol/L — ABNORMAL LOW (ref 101–111)
Creatinine, Ser: 1.55 mg/dL — ABNORMAL HIGH (ref 0.44–1.00)
GFR, EST AFRICAN AMERICAN: 36 mL/min — AB (ref >60)
GFR, EST NON AFRICAN AMERICAN: 31 mL/min — AB (ref >60)
Glucose, Bld: 251 mg/dL — ABNORMAL HIGH (ref 65–99)
Potassium: 3.1 mmol/L — ABNORMAL LOW (ref 3.5–5.1)
Sodium: 139 mmol/L (ref 135–145)

## 2017-05-16 LAB — MAGNESIUM: Magnesium: 2.4 mg/dL (ref 1.7–2.4)

## 2017-05-16 NOTE — Progress Notes (Signed)
ELECTROPHYSIOLOGY OFFICE  NOTE  Patient ID: Dawn Garcia, MRN: [id number], DOB/AGE: 06-09-1940 77 y.o. Admit date: (Not on file) Date of Consult: 05/16/2017  Primary Physician: Dawn Hess, MD Primary Cardiologist: tg       HPI Dawn Garcia is a 77 y.o. female  Seen in followup for pacing by Dr. 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 This was complicated by right atrial lead dislodgment requiring revision.  Because of recurrent atrial fibrillation he started her on dronaderone in the fall which was complicated by diarrhea. It was her impression however that prior to its discontinuation it had been associated with a marked improvement in exercise tolerance, allowing her to walk a flight of stairs without stopping for example.  She has a history of a prior PVI.     DATE TEST    6/17    Echo   EF 65 %   11/17    Myoview   EF 54 % Anteroapical defect  8/18 Echo  EF 50-55%  possible wall motion abnormality-reviewed by TG no further workup necessary  2/19 Cath    moderate mid-to distal LAD with patent circumflex stent//LVEDP 8//RA 11-notably patient was in sinus rhythm  2/19` Echo   EF 50-55%    She has had recurrent atrial fibrillation which prompted an evaluation in the emergency room 1/19; other significant symptoms with her atrial fibrillation include listlessness and fatigue.   She is having problems with abdominal swelling and progressive shortness of breath.  She was dyspneic at less than about 10 feet.  She was admitted 2/19.  Cath as above; chest CT without evidence of interstitial lung disease.  D-dimer was negative.   Her amiodarone has been discontinued.  Plan was to dofetilide and catheter ablation.  Amiodarone level was 0.6   Date Cr K TSH LFTs Hgb  12/17   0.034    12/18 1.19 3.9      1/19 1.69 3.1   16.4  2/19 1.69 4.3 0.198 (T4 3.54)              Thromboembolic risk factors ( age  -2, DM-1, Vasc disease -1, CHF-1, Gender-1) for a CHADSVASc Score of  6   Since hospital discharge, she has had good days and bad days.  She has been feeling better than she was prior to hospitalization.  Her SOB she thinks tracks with her afib.  No edema.  No chest pain   Past Medical History:  Diagnosis Date  . Atrial fibrillation ([place])   . CAD (coronary artery disease)   . Cancer ([place])    skin  . CHF (congestive heart failure) ([facility])     A-Fib  . COPD (chronic obstructive pulmonary disease) ([place])   . Diabetes mellitus without complication ([place])   . GERD (gastroesophageal reflux disease)   . Hyperlipidemia   . Hypocalcemia   . Hypokalemia   . Macular degeneration   . Myocardial infarction ([place])   . 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 Sinus node dysfunction ([facility])   . Thyroid disease   . Vitamin B12 deficiency   . Vitamin D deficiency       Surgical History:  Past Surgical History:  Procedure Laterality Date  . CHOLECYSTECTOMY    . COLONOSCOPY  2015   1 polyp removed- repeat 3 years  . CORONARY ANGIOPLASTY WITH STENT PLACEMENT    . EP IMPLANTABLE DEVICE N/A 11/10/2015   Procedure: Pacemaker Implant;  Surgeon: Will [name], MD;  Location: [facility];  Service: Cardiovascular;  Laterality: N/A;  . EP IMPLANTABLE DEVICE N/A 11/11/2015   Procedure: Lead Revision/Repair;  Surgeon: Evans Lance, MD;  Location: [facility];  Service: Cardiovascular;  Laterality: N/A;  . parathyroid surgery     x 2  . RIGHT/LEFT HEART CATH AND CORONARY ANGIOGRAPHY N/A 05/03/2017   Procedure: RIGHT/LEFT HEART CATH AND CORONARY ANGIOGRAPHY;  Surgeon: Leonie Man, MD;  Location: [facility];  Service: Cardiovascular;  Laterality: N/A;  . THYROID SURGERY    . TOTAL SHOULDER ARTHROPLASTY Right    x 2  . VAGINAL HYSTERECTOMY       Home Meds: Prior to Admission medications   Medication Sig Start Date End Date Taking? Authorizing Provider  albuterol (PROVENTIL HFA;VENTOLIN HFA) 108 (90 Base) MCG/ACT inhaler Inhale 2  puffs into the lungs every 6 (six) hours as needed for wheezing or shortness of breath.   Yes Historical Provider, MD  ARTIFICIAL TEAR OP Place 1 drop into both eyes as needed (for dry eyes).   Yes Historical Provider, MD  Cholecalciferol (VITAMIN D3) 5000 units TABS Take 1 tablet by mouth every morning.   Yes Historical Provider, MD  furosemide (LASIX) 20 MG tablet Take 60 mg by mouth every morning.   Yes Historical Provider, MD  isosorbide mononitrate (IMDUR) 30 MG 24 hr tablet Take 1 tablet (30 mg total) by mouth daily. 02/11/16  Yes Will [name], MD  levothyroxine (SYNTHROID, LEVOTHROID) 150 MCG tablet Take 1 tablet (150 mcg total) by mouth daily. 12/08/15  Yes Dawn Hess, MD  Melatonin 5 MG TABS Take 1 tablet by mouth at bedtime.   Yes Historical Provider, MD  metFORMIN (GLUCOPHAGE) 500 MG tablet Take 1 tablet (500 mg total) by mouth 2 (two) times daily with a meal. 09/16/15  Yes Adline Potter, MD  metoprolol succinate (TOPROL XL) 25 MG 24 hr tablet Take 1 tablet (25 mg total) by mouth daily. 02/11/16  Yes Will [name], MD  nitroGLYCERIN (NITROSTAT) 0.4 MG SL tablet Place 0.4 mg under the tongue every 5 (five) minutes as needed for chest pain.   Yes Historical Provider, MD  pantoprazole (PROTONIX) 40 MG tablet Take 1 tablet (40 mg total) by mouth daily. 10/16/15  Yes Juline Patch, MD  potassium chloride (KLOR-CON) 8 MEQ tablet Take 1 tablet (8 mEq total) by mouth 2 (two) times daily. 12/21/15  Yes Dawn Hess, MD  rosuvastatin (CRESTOR) 40 MG tablet Take 1 tablet (40 mg total) by mouth daily. 09/18/15  Yes Minna Merritts, MD  sitaGLIPtin (JANUVIA) 50 MG tablet Take 50 mg by mouth daily.   Yes Historical Provider, MD  vitamin B-12 (CYANOCOBALAMIN) 500 MCG tablet Take 500 mcg by mouth daily.   Yes Historical Provider, MD  warfarin (COUMADIN) 7.5 MG tablet Take 1 tablet (7.5 mg total) by mouth daily. 01/20/16  Yes Minna Merritts, MD    Allergies:  Allergies  Allergen  Reactions  . Demerol [Meperidine] Anaphylaxis    Tolerated Fentanyl 11/10/15  . Multaq [Dronedarone] Diarrhea  . Sulfa Antibiotics Anaphylaxis  . Tetracyclines & Related Other (See Comments) and Swelling    Made nose, lips,  And tongue itchy Made nose, lips,  And tongue itchy    Social History   Socioeconomic History  . Marital status: Married    Spouse name: Not on file  . Number of children: Not on file  . Years of education: Not on file  .  Highest education level: Not on file  Social Needs  . Financial resource strain: Not on file  . Food insecurity - worry: Not on file  . Food insecurity - inability: Not on file  . Transportation needs - medical: Not on file  . Transportation needs - non-medical: Not on file  Occupational History  . Occupation: Retired  Tobacco Use  . Smoking status: Never Smoker  . Smokeless tobacco: Never Used  Substance and Sexual Activity  . Alcohol use: Yes    Alcohol/week: 0.0 oz  . Drug use: No  . Sexual activity: Not on file  Other Topics Concern  . Not on file  Social History Narrative   Lives in [place] w/ husband.     Family History  Problem Relation Age of Onset  . Thyroid disease Mother 2       3 days after surgery  . Heart defect Father 43       ?ascending aortic aneurysm     ROS:  Please see the history of present illness.     All other systems reviewed and negative.   BP 118/64 (BP Location: Left Arm, Patient Position: Sitting, Cuff Size: Normal)   Pulse 60   Ht 5\' 3"  (1.6 m)   Wt 198 lb 2 oz (89.9 kg)   BMI 35.10 kg/m  Well developed and nourished in no acute distress HENT normal Neck supple with JVP-flat Clear Regular rate and rhythm, no murmurs or gallops Abd-soft with active BS No Clubbing cyanosis edema Skin-warm and dry A & Oriented  Grossly normal sensory and motor function    Labs: Cardiac Enzymes No results for input(s): CKTOTAL, CKMB, TROPONINI in the last 72 hours. CBC Lab Results  Component  Value Date   WBC 5.9 05/04/2017   HGB 12.3 05/04/2017   HCT 38.4 05/04/2017   MCV 86.7 05/04/2017   PLT 211 05/04/2017   PROTIME: No results for input(s): LABPROT, INR in the last 72 hours. Chemistry  No results for input(s): NA, K, CL, CO2, BUN, CREATININE, CALCIUM, PROT, BILITOT, ALKPHOS, ALT, AST, GLUCOSE in the last 168 hours.  Invalid input(s): LABALBU Lipids Lab Results  Component Value Date   CHOL 127 04/19/2016   HDL 42 04/19/2016   LDLCALC 43 04/19/2016   TRIG 209 (H) 04/19/2016   BNP No results found for: PROBNP Thyroid Function Tests: No results for input(s): TSH, T4TOTAL, T3FREE, THYROIDAB in the last 72 hours.  Invalid input(s): FREET3 Miscellaneous Lab Results  Component Value Date   DDIMER <0.27 04/28/2017    Radiology/Studies:  Dg Chest 2 View  Result Date: 04/27/2017 CLINICAL DATA:  Shortness of Breath EXAM: CHEST  2 VIEW COMPARISON:  March 25, 2017 FINDINGS: There is scarring in the left base region. Lungs elsewhere are clear. Heart size and pulmonary vascularity are normal. Pacemaker leads are attached to the right atrium and right ventricle. There is aortic atherosclerosis. No adenopathy. Patient is status post total shoulder replacement on the right. IMPRESSION: Scarring left base. No edema or consolidation. Heart size normal. Pacemaker leads attached to right atrium and right ventricle. Aortic atherosclerosis present. No adenopathy evident. Aortic Atherosclerosis (ICD10-I70.0). Electronically Signed   By: Lowella Grip III M.D.   On: 04/27/2017 15:22   Ct Chest High Resolution  Result Date: 04/30/2017 CLINICAL DATA:  Dyspnea.  Inpatient. EXAM: CT CHEST WITHOUT CONTRAST TECHNIQUE: Multidetector CT imaging of the chest was performed following the standard protocol without intravenous contrast. High resolution imaging of the lungs, as well as  inspiratory and expiratory imaging, was performed. COMPARISON:  04/27/2017 chest radiograph.  12/23/2015 chest  CT. FINDINGS: Cardiovascular: Stable top-normal heart size. Stable 2 lead left subclavian pacemaker with lead tips in the right atrium and right ventricular apex. Stable trace pericardial effusion/thickening. Three-vessel coronary atherosclerosis. Atherosclerotic nonaneurysmal thoracic aorta. Stable borderline prominent main pulmonary artery (3.3 cm diameter). Mediastinum/Nodes: Apparent thyroidectomy. Unremarkable esophagus. No pathologically enlarged axillary, mediastinal or gross hilar lymph nodes, noting limited sensitivity for the detection of hilar adenopathy on this noncontrast study. Lungs/Pleura: No pneumothorax. No pleural effusion. No acute consolidative airspace disease, lung masses or significant pulmonary nodules. Stable small parenchymal bands in the medial segment right middle lobe, dependent left lower lobe and medial lower lobes bilaterally, compatible with mild postinfectious/postinflammatory scarring. No significant regions of subpleural reticulation, ground-glass attenuation, traction bronchiectasis, architectural distortion or frank honeycombing. No significant air trapping on the expiration sequence. Upper abdomen: No acute abnormality. Musculoskeletal: No aggressive appearing focal osseous lesions. Moderate to marked thoracic spondylosis. Partially visualized right total shoulder arthroplasty. IMPRESSION: 1. No acute cardiopulmonary disease. No evidence of interstitial lung disease. 2. Mild postinfectious/postinflammatory scarring in the mid to lower lungs, stable. 3. Three-vessel coronary atherosclerosis. 4. Stable borderline prominent main pulmonary artery. Aortic Atherosclerosis (ICD10-I70.0). Electronically Signed   By: Ilona Sorrel M.D.   On: 04/30/2017 16:02   US Abdomen Limited Ruq  Result Date: 04/29/2017 CLINICAL DATA:  Abnormal LFTs.  Previous cholecystectomy. EXAM: ULTRASOUND ABDOMEN LIMITED RIGHT UPPER QUADRANT COMPARISON:  None. FINDINGS: Gallbladder: Previous  cholecystectomy. Common bile duct: Diameter: 8.0 mm. Liver: Findings suggesting mild hepatic steatosis without focal mass. Portal vein is patent on color Doppler imaging with normal direction of blood flow towards the liver. IMPRESSION: Previous cholecystectomy.  No acute findings. Mild hepatic steatosis without focal mass. Electronically Signed   By: Marin Olp M.D.   On: 04/29/2017 13:55    EKG: AV pacing    Assessment and Plan:  Sick Sinus syndrome  Persistent Atrial Fib with prior PVI  Pacemaker St Jude   OSA  P-wave oversensing resulting false detection of atrial fibrillation  HFpEF   chronic   Coronary artery disease with prior stenting  Renal insufficiency-improved   Hyperthyroidism-iatrogenic   She is somewhat better.  I wonder whether her relatively good hemodynamics at catheterization were related to sinus rhythm.  Review of the telemetry strips from the hospital confirmed that she was in sinus rhythm on the day of her catheterization.  I wonder what they would have been like in atrial fibrillation.  This is the hypothesis although she reminds me that she has had atrial fibrillation for years and only recently has had this degree of decompensation.  It is possible that amiodarone was contributing although this is less likely given the CT scan showing no acute changes.  Patient is have chronic bilateral lower lung scarring.  Her plan with Dr. Carlyn Reichert was to proceed with catheter ablation.  The hope is been to get her some degree of sinus rhythm as it would both facilitate the procedure and improve outcomes.  In that regard we will check amiodarone levels.  Plan will be admission for dofetilide loading once the amiodarone level is less than/or equal to 0.3  We will recheck her renal function   We spent more than 50% of our >25 min visit in face to face counseling regarding the above  [place]

## 2017-05-16 NOTE — Patient Instructions (Signed)
Medication Instructions: - Your physician recommends that you continue on your current medications as directed. Please refer to the Current Medication list given to you today.   Labwork: - Your physician recommends that you have lab work today: amiodarone level/ BMP/ magnesium   Procedures/Testing: - none ordered  Follow-Up: - pending lab results  Any Additional Special Instructions Will Be Listed Below (If Applicable).     If you need a refill on your cardiac medications before your next appointment, please call your pharmacy.

## 2017-05-16 NOTE — Telephone Encounter (Signed)
Notes recorded by Emily Filbert, RN on 05/16/2017 at 5:48 PM EST Per Dr. Caryl Comes - increase potassium to 2 pills (16 meq) BID x 2 days, the repeat her BMP on Friday. I have called the patient and made her aware of her lab results.  She voices understanding to increase potassium- I advised her to go ahead and take an extra 8 meq tonight and then 2 pills (16 meq) BID on 3/6 & 3/7. She will have her BMP repeated at [facility] on Friday. She is aware her amio level is still pending at this time and I will call her with these results when received.  She is agreeable. ------  Notes recorded by Emily Filbert, RN on 05/16/2017 at 5:13 PM EST Patient currently on torsemide 40 mg daily & potassium 8 meq TWICE daily Preliminary results reviewed. Forwarded to MD desktop for review and signature.   Text message also sent to the Dr. Caryl Comes to please review.

## 2017-05-17 LAB — AMIODARONE LEVEL
AMIODARONE LVL: 0.6 ug/mL — AB (ref 1.0–2.5)
N-DESETHYL-AMIODARONE: 0.5 ug/mL — AB (ref 1.0–2.5)

## 2017-05-18 ENCOUNTER — Other Ambulatory Visit: Payer: Self-pay | Admitting: *Deleted

## 2017-05-18 ENCOUNTER — Ambulatory Visit (HOSPITAL_COMMUNITY): Payer: Medicare Other | Admitting: Nurse Practitioner

## 2017-05-18 DIAGNOSIS — I480 Paroxysmal atrial fibrillation: Secondary | ICD-10-CM | POA: Diagnosis not present

## 2017-05-18 DIAGNOSIS — E1122 Type 2 diabetes mellitus with diabetic chronic kidney disease: Secondary | ICD-10-CM | POA: Diagnosis not present

## 2017-05-18 DIAGNOSIS — I130 Hypertensive heart and chronic kidney disease with heart failure and stage 1 through stage 4 chronic kidney disease, or unspecified chronic kidney disease: Secondary | ICD-10-CM | POA: Diagnosis not present

## 2017-05-18 DIAGNOSIS — I5030 Unspecified diastolic (congestive) heart failure: Secondary | ICD-10-CM | POA: Diagnosis not present

## 2017-05-18 DIAGNOSIS — N183 Chronic kidney disease, stage 3 (moderate): Secondary | ICD-10-CM | POA: Diagnosis not present

## 2017-05-18 DIAGNOSIS — I2510 Atherosclerotic heart disease of native coronary artery without angina pectoris: Secondary | ICD-10-CM | POA: Diagnosis not present

## 2017-05-19 ENCOUNTER — Other Ambulatory Visit
Admission: RE | Admit: 2017-05-19 | Discharge: 2017-05-19 | Disposition: A | Discharge status: Home / Self Care | Payer: Medicare Other | Source: Ambulatory Visit | Attending: Internal Medicine | Admitting: Internal Medicine

## 2017-05-19 DIAGNOSIS — E876 Hypokalemia: Secondary | ICD-10-CM | POA: Diagnosis not present

## 2017-05-19 DIAGNOSIS — E1122 Type 2 diabetes mellitus with diabetic chronic kidney disease: Secondary | ICD-10-CM | POA: Diagnosis not present

## 2017-05-19 DIAGNOSIS — E559 Vitamin D deficiency, unspecified: Secondary | ICD-10-CM | POA: Diagnosis not present

## 2017-05-19 DIAGNOSIS — E782 Mixed hyperlipidemia: Secondary | ICD-10-CM | POA: Diagnosis not present

## 2017-05-19 DIAGNOSIS — I480 Paroxysmal atrial fibrillation: Secondary | ICD-10-CM | POA: Diagnosis not present

## 2017-05-19 DIAGNOSIS — E669 Obesity, unspecified: Secondary | ICD-10-CM | POA: Diagnosis not present

## 2017-05-19 LAB — BASIC METABOLIC PANEL
ANION GAP: 11 (ref 5–15)
BUN: 20 mg/dL (ref 6–20)
CHLORIDE: 103 mmol/L (ref 101–111)
CO2: 25 mmol/L (ref 22–32)
Calcium: 9.1 mg/dL (ref 8.9–10.3)
Creatinine, Ser: 1.39 mg/dL — ABNORMAL HIGH (ref 0.44–1.00)
GFR calc Af Amer: 42 mL/min — ABNORMAL LOW (ref >60)
GFR, EST NON AFRICAN AMERICAN: 36 mL/min — AB (ref >60)
GLUCOSE: 249 mg/dL — AB (ref 65–99)
POTASSIUM: 3.8 mmol/L (ref 3.5–5.1)
Sodium: 139 mmol/L (ref 135–145)

## 2017-05-23 DIAGNOSIS — E1122 Type 2 diabetes mellitus with diabetic chronic kidney disease: Secondary | ICD-10-CM | POA: Diagnosis not present

## 2017-05-23 DIAGNOSIS — N183 Chronic kidney disease, stage 3 (moderate): Secondary | ICD-10-CM | POA: Diagnosis not present

## 2017-05-23 DIAGNOSIS — I130 Hypertensive heart and chronic kidney disease with heart failure and stage 1 through stage 4 chronic kidney disease, or unspecified chronic kidney disease: Secondary | ICD-10-CM | POA: Diagnosis not present

## 2017-05-23 DIAGNOSIS — I2510 Atherosclerotic heart disease of native coronary artery without angina pectoris: Secondary | ICD-10-CM | POA: Diagnosis not present

## 2017-05-23 DIAGNOSIS — I480 Paroxysmal atrial fibrillation: Secondary | ICD-10-CM | POA: Diagnosis not present

## 2017-05-23 DIAGNOSIS — I5030 Unspecified diastolic (congestive) heart failure: Secondary | ICD-10-CM | POA: Diagnosis not present

## 2017-05-23 LAB — AMIODARONE LEVEL
Amiodarone Lvl: 0.6 ug/mL — ABNORMAL LOW (ref 1.0–2.5)
N-Desethyl-Amiodarone: 0.6 ug/mL — ABNORMAL LOW (ref 1.0–2.5)

## 2017-05-25 DIAGNOSIS — N183 Chronic kidney disease, stage 3 (moderate): Secondary | ICD-10-CM | POA: Diagnosis not present

## 2017-05-25 DIAGNOSIS — I5030 Unspecified diastolic (congestive) heart failure: Secondary | ICD-10-CM | POA: Diagnosis not present

## 2017-05-25 DIAGNOSIS — I2510 Atherosclerotic heart disease of native coronary artery without angina pectoris: Secondary | ICD-10-CM | POA: Diagnosis not present

## 2017-05-25 DIAGNOSIS — E1122 Type 2 diabetes mellitus with diabetic chronic kidney disease: Secondary | ICD-10-CM | POA: Diagnosis not present

## 2017-05-25 DIAGNOSIS — I130 Hypertensive heart and chronic kidney disease with heart failure and stage 1 through stage 4 chronic kidney disease, or unspecified chronic kidney disease: Secondary | ICD-10-CM | POA: Diagnosis not present

## 2017-05-25 DIAGNOSIS — I480 Paroxysmal atrial fibrillation: Secondary | ICD-10-CM | POA: Diagnosis not present

## 2017-05-30 DIAGNOSIS — I2510 Atherosclerotic heart disease of native coronary artery without angina pectoris: Secondary | ICD-10-CM | POA: Diagnosis not present

## 2017-05-30 DIAGNOSIS — I130 Hypertensive heart and chronic kidney disease with heart failure and stage 1 through stage 4 chronic kidney disease, or unspecified chronic kidney disease: Secondary | ICD-10-CM | POA: Diagnosis not present

## 2017-05-30 DIAGNOSIS — E1122 Type 2 diabetes mellitus with diabetic chronic kidney disease: Secondary | ICD-10-CM | POA: Diagnosis not present

## 2017-05-30 DIAGNOSIS — I5030 Unspecified diastolic (congestive) heart failure: Secondary | ICD-10-CM | POA: Diagnosis not present

## 2017-05-30 DIAGNOSIS — N183 Chronic kidney disease, stage 3 (moderate): Secondary | ICD-10-CM | POA: Diagnosis not present

## 2017-05-30 DIAGNOSIS — I480 Paroxysmal atrial fibrillation: Secondary | ICD-10-CM | POA: Diagnosis not present

## 2017-05-31 ENCOUNTER — Telehealth: Payer: Self-pay | Admitting: Internal Medicine

## 2017-05-31 ENCOUNTER — Other Ambulatory Visit: Payer: Self-pay | Admitting: *Deleted

## 2017-05-31 DIAGNOSIS — I480 Paroxysmal atrial fibrillation: Secondary | ICD-10-CM

## 2017-05-31 MED ORDER — POTASSIUM CHLORIDE ER 8 MEQ PO TBCR: EXTENDED_RELEASE_TABLET | ORAL | Status: DC

## 2017-05-31 NOTE — Telephone Encounter (Signed)
I called the patient with her most recent lab results- done 05/19/17 to recheck her BMP- specifically potassium level.  We add advised the patient on 05/16/17 to increase potassium from 1 tablet (8 meq) BID to 2 tablets (16 meq) BID x 3 days.   Repeat K+ on 3/8 was 3.8. Reviewed with Dr. Caryl Comes- orders received to have the patient increase potassium 8 meq to 2 tablets (16 meq) in the AM and 1 tablet (8 meq) in the PM.  I have notified the patient of the above and she voices understanding.

## 2017-05-31 NOTE — Progress Notes (Signed)
Amiodarone level ordered.  

## 2017-06-06 DIAGNOSIS — I2510 Atherosclerotic heart disease of native coronary artery without angina pectoris: Secondary | ICD-10-CM | POA: Diagnosis not present

## 2017-06-06 DIAGNOSIS — I130 Hypertensive heart and chronic kidney disease with heart failure and stage 1 through stage 4 chronic kidney disease, or unspecified chronic kidney disease: Secondary | ICD-10-CM | POA: Diagnosis not present

## 2017-06-06 DIAGNOSIS — I480 Paroxysmal atrial fibrillation: Secondary | ICD-10-CM | POA: Diagnosis not present

## 2017-06-06 DIAGNOSIS — E1122 Type 2 diabetes mellitus with diabetic chronic kidney disease: Secondary | ICD-10-CM | POA: Diagnosis not present

## 2017-06-06 DIAGNOSIS — N183 Chronic kidney disease, stage 3 (moderate): Secondary | ICD-10-CM | POA: Diagnosis not present

## 2017-06-06 DIAGNOSIS — I5030 Unspecified diastolic (congestive) heart failure: Secondary | ICD-10-CM | POA: Diagnosis not present

## 2017-06-08 ENCOUNTER — Other Ambulatory Visit: Payer: Self-pay | Admitting: Internal Medicine

## 2017-06-08 DIAGNOSIS — I2510 Atherosclerotic heart disease of native coronary artery without angina pectoris: Secondary | ICD-10-CM | POA: Diagnosis not present

## 2017-06-08 DIAGNOSIS — I480 Paroxysmal atrial fibrillation: Secondary | ICD-10-CM | POA: Diagnosis not present

## 2017-06-08 DIAGNOSIS — N183 Chronic kidney disease, stage 3 (moderate): Secondary | ICD-10-CM | POA: Diagnosis not present

## 2017-06-08 DIAGNOSIS — I5030 Unspecified diastolic (congestive) heart failure: Secondary | ICD-10-CM | POA: Diagnosis not present

## 2017-06-08 DIAGNOSIS — E1122 Type 2 diabetes mellitus with diabetic chronic kidney disease: Secondary | ICD-10-CM | POA: Diagnosis not present

## 2017-06-08 DIAGNOSIS — I130 Hypertensive heart and chronic kidney disease with heart failure and stage 1 through stage 4 chronic kidney disease, or unspecified chronic kidney disease: Secondary | ICD-10-CM | POA: Diagnosis not present

## 2017-06-09 ENCOUNTER — Other Ambulatory Visit: Payer: Self-pay | Admitting: *Deleted

## 2017-06-09 ENCOUNTER — Other Ambulatory Visit: Payer: Self-pay | Admitting: Cardiovascular Disease

## 2017-06-09 MED ORDER — POTASSIUM CHLORIDE ER 8 MEQ PO TBCR: EXTENDED_RELEASE_TABLET | ORAL | 3 refills | Status: DC

## 2017-06-09 NOTE — Telephone Encounter (Signed)
Yes- she is supposed to be on: Potassium 8 meq- take 2 tablets (16 meq) in the morning and 1 tablet (8 meq) in the evening.  Thanks!

## 2017-06-12 DIAGNOSIS — I2510 Atherosclerotic heart disease of native coronary artery without angina pectoris: Secondary | ICD-10-CM | POA: Diagnosis not present

## 2017-06-12 DIAGNOSIS — I5030 Unspecified diastolic (congestive) heart failure: Secondary | ICD-10-CM | POA: Diagnosis not present

## 2017-06-12 DIAGNOSIS — I480 Paroxysmal atrial fibrillation: Secondary | ICD-10-CM | POA: Diagnosis not present

## 2017-06-12 DIAGNOSIS — N183 Chronic kidney disease, stage 3 (moderate): Secondary | ICD-10-CM | POA: Diagnosis not present

## 2017-06-12 DIAGNOSIS — H43813 Vitreous degeneration, bilateral: Secondary | ICD-10-CM | POA: Diagnosis not present

## 2017-06-12 DIAGNOSIS — I130 Hypertensive heart and chronic kidney disease with heart failure and stage 1 through stage 4 chronic kidney disease, or unspecified chronic kidney disease: Secondary | ICD-10-CM | POA: Diagnosis not present

## 2017-06-12 DIAGNOSIS — H353221 Exudative age-related macular degeneration, left eye, with active choroidal neovascularization: Secondary | ICD-10-CM | POA: Diagnosis not present

## 2017-06-12 DIAGNOSIS — E1122 Type 2 diabetes mellitus with diabetic chronic kidney disease: Secondary | ICD-10-CM | POA: Diagnosis not present

## 2017-06-12 DIAGNOSIS — H35033 Hypertensive retinopathy, bilateral: Secondary | ICD-10-CM | POA: Diagnosis not present

## 2017-06-12 DIAGNOSIS — H353112 Nonexudative age-related macular degeneration, right eye, intermediate dry stage: Secondary | ICD-10-CM | POA: Diagnosis not present

## 2017-06-13 DIAGNOSIS — R29890 Loss of height: Secondary | ICD-10-CM | POA: Diagnosis not present

## 2017-06-13 DIAGNOSIS — N951 Menopausal and female climacteric states: Secondary | ICD-10-CM | POA: Diagnosis not present

## 2017-06-19 DIAGNOSIS — I10 Essential (primary) hypertension: Secondary | ICD-10-CM | POA: Diagnosis not present

## 2017-06-19 DIAGNOSIS — I2510 Atherosclerotic heart disease of native coronary artery without angina pectoris: Secondary | ICD-10-CM | POA: Diagnosis not present

## 2017-06-19 DIAGNOSIS — E039 Hypothyroidism, unspecified: Secondary | ICD-10-CM | POA: Diagnosis not present

## 2017-06-19 DIAGNOSIS — E119 Type 2 diabetes mellitus without complications: Secondary | ICD-10-CM | POA: Diagnosis not present

## 2017-06-19 DIAGNOSIS — E782 Mixed hyperlipidemia: Secondary | ICD-10-CM | POA: Diagnosis not present

## 2017-06-20 ENCOUNTER — Ambulatory Visit (INDEPENDENT_AMBULATORY_CARE_PROVIDER_SITE_OTHER): Payer: Medicare Other | Admitting: *Deleted

## 2017-06-20 DIAGNOSIS — I495 Sick sinus syndrome: Secondary | ICD-10-CM | POA: Diagnosis not present

## 2017-06-20 DIAGNOSIS — E1165 Type 2 diabetes mellitus with hyperglycemia: Secondary | ICD-10-CM | POA: Diagnosis not present

## 2017-06-20 DIAGNOSIS — N183 Chronic kidney disease, stage 3 (moderate): Secondary | ICD-10-CM | POA: Diagnosis not present

## 2017-06-20 DIAGNOSIS — E890 Postprocedural hypothyroidism: Secondary | ICD-10-CM | POA: Diagnosis not present

## 2017-06-20 NOTE — Progress Notes (Signed)
Remote pacemaker transmission.   

## 2017-06-22 ENCOUNTER — Encounter: Payer: Self-pay | Admitting: Cardiology

## 2017-06-29 ENCOUNTER — Encounter: Payer: Self-pay | Admitting: *Deleted

## 2017-06-29 ENCOUNTER — Telehealth: Payer: Self-pay | Admitting: *Deleted

## 2017-06-29 NOTE — Telephone Encounter (Signed)
Letter mailed to the patient to please have her amiodarone level rechecked at the [facility]- at her convenience.

## 2017-06-29 NOTE — Telephone Encounter (Signed)
-----   Message from Emily Filbert, RN sent at 05/31/2017  2:36 PM EDT ----- amio level due ~ 4/5

## 2017-07-02 DIAGNOSIS — I2510 Atherosclerotic heart disease of native coronary artery without angina pectoris: Secondary | ICD-10-CM

## 2017-07-02 HISTORY — DX: Atherosclerotic heart disease of native coronary artery without angina pectoris: I25.10

## 2017-07-03 ENCOUNTER — Other Ambulatory Visit
Admission: RE | Admit: 2017-07-03 | Discharge: 2017-07-03 | Disposition: A | Discharge status: Home / Self Care | Payer: Medicare Other | Source: Ambulatory Visit | Attending: Internal Medicine | Admitting: Internal Medicine

## 2017-07-03 DIAGNOSIS — I480 Paroxysmal atrial fibrillation: Secondary | ICD-10-CM | POA: Insufficient documentation

## 2017-07-04 LAB — AMIODARONE LEVEL
Amiodarone Lvl: 0.6 ug/mL — ABNORMAL LOW (ref 1.0–2.5)
N-Desethyl-Amiodarone: 0.4 ug/mL — ABNORMAL LOW (ref 1.0–2.5)

## 2017-07-12 LAB — CUP PACEART REMOTE DEVICE CHECK
Battery Voltage: 3.01 V
Brady Statistic AP VP Percent: 97 %
Brady Statistic AS VS Percent: 1 %
Brady Statistic RA Percent Paced: 85 %
Brady Statistic RV Percent Paced: 98 %
Implantable Lead Implant Date: 20170829
Implantable Lead Location: 753859
Lead Channel Impedance Value: 580 Ohm
Lead Channel Pacing Threshold Amplitude: 1.125 V
Lead Channel Pacing Threshold Pulse Width: 0.4 ms
Lead Channel Pacing Threshold Pulse Width: 0.4 ms
Lead Channel Setting Pacing Amplitude: 1.25 V
Lead Channel Setting Sensing Sensitivity: 2 mV
MDC IDC LEAD IMPLANT DT: 20170829
MDC IDC LEAD LOCATION: 753860
MDC IDC MSMT BATTERY REMAINING LONGEVITY: 116 mo
MDC IDC MSMT BATTERY REMAINING PERCENTAGE: 95.5 %
MDC IDC MSMT LEADCHNL RA IMPEDANCE VALUE: 430 Ohm
MDC IDC MSMT LEADCHNL RA SENSING INTR AMPL: 3.8 mV
MDC IDC MSMT LEADCHNL RV PACING THRESHOLD AMPLITUDE: 1 V
MDC IDC MSMT LEADCHNL RV SENSING INTR AMPL: 12 mV
MDC IDC PG IMPLANT DT: 20170829
MDC IDC SESS DTM: 20190409084328
MDC IDC SET LEADCHNL RA PACING AMPLITUDE: 2.125
MDC IDC SET LEADCHNL RV PACING PULSEWIDTH: 0.4 ms
MDC IDC STAT BRADY AP VS PERCENT: 1 %
MDC IDC STAT BRADY AS VP PERCENT: 3 %
Pulse Gen Model: 2272
Pulse Gen Serial Number: 7945290

## 2017-07-24 DIAGNOSIS — H353112 Nonexudative age-related macular degeneration, right eye, intermediate dry stage: Secondary | ICD-10-CM | POA: Diagnosis not present

## 2017-07-24 DIAGNOSIS — H43813 Vitreous degeneration, bilateral: Secondary | ICD-10-CM | POA: Diagnosis not present

## 2017-07-24 DIAGNOSIS — H353221 Exudative age-related macular degeneration, left eye, with active choroidal neovascularization: Secondary | ICD-10-CM | POA: Diagnosis not present

## 2017-07-24 DIAGNOSIS — H35033 Hypertensive retinopathy, bilateral: Secondary | ICD-10-CM | POA: Diagnosis not present

## 2017-07-25 ENCOUNTER — Ambulatory Visit (INDEPENDENT_AMBULATORY_CARE_PROVIDER_SITE_OTHER): Payer: Medicare Other | Admitting: Internal Medicine

## 2017-07-25 ENCOUNTER — Encounter: Payer: Self-pay | Admitting: Internal Medicine

## 2017-07-25 VITALS — BP 102/62 | HR 64 | Ht 63.0 in | Wt 206.8 lb

## 2017-07-25 DIAGNOSIS — Z79899 Other long term (current) drug therapy: Secondary | ICD-10-CM | POA: Diagnosis not present

## 2017-07-25 DIAGNOSIS — I481 Persistent atrial fibrillation: Secondary | ICD-10-CM

## 2017-07-25 DIAGNOSIS — Z950 Presence of cardiac pacemaker: Secondary | ICD-10-CM | POA: Diagnosis not present

## 2017-07-25 DIAGNOSIS — I2510 Atherosclerotic heart disease of native coronary artery without angina pectoris: Secondary | ICD-10-CM

## 2017-07-25 DIAGNOSIS — I495 Sick sinus syndrome: Secondary | ICD-10-CM | POA: Diagnosis not present

## 2017-07-25 DIAGNOSIS — I4819 Other persistent atrial fibrillation: Secondary | ICD-10-CM

## 2017-07-25 NOTE — Patient Instructions (Signed)
Medication Instructions: - Your physician recommends that you continue on your current medications as directed. Please refer to the Current Medication list given to you today.  Labwork: - Your physician recommends that you have lab work today: amiodarone level  Procedures/Testing: - none ordered  Follow-Up: - You have been referred to : Dr. Allegra Lai - consultation for ablation (you will get a call from our [facility] office, [place], to schedule)   Any Additional Special Instructions Will Be Listed Below (If Applicable).     If you need a refill on your cardiac medications before your next appointment, please call your pharmacy.

## 2017-07-25 NOTE — Progress Notes (Signed)
ELECTROPHYSIOLOGY OFFICE  NOTE  Patient ID: Dawn Garcia, MRN: [id number], DOB/AGE: 10/18/1940 77 y.o. Admit date: (Not on file) Date of Consult: 07/25/2017  Primary Physician: Perrin Maltese, MD Primary Cardiologist: tg       HPI Dawn Garcia is a 77 y.o. female  Seen in followup for pacing by Dr. 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 This was complicated by right atrial lead dislodgment requiring revision.  Because of recurrent atrial fibrillation he started her on dronaderone in the fall which was complicated by diarrhea. It was her impression however that prior to its discontinuation it had been associated with a marked improvement in exercise tolerance, allowing her to walk a flight of stairs without stopping for example.  She has a history of a prior PVI.     DATE TEST    6/17    Echo   EF 65 %   11/17    Myoview   EF 54 % Anteroapical defect  8/18 Echo  EF 50-55%  possible wall motion abnormality-reviewed by TG no further workup necessary  2/19 Cath    moderate mid-to distal LAD with patent circumflex stent//LVEDP 8//RA 11-notably patient was in sinus rhythm  2/19` Echo   EF 50-55%        She is having problems with abdominal swelling and progressive shortness of breath.  She was dyspneic at less than about 10 feet.  She was admitted 2/19.  Cath as above; chest CT without evidence of interstitial lung disease.  D-dimer was negative.   Her amiodarone has been discontinued.  Plan was to dofetilide and catheter ablation.  Amiodarone level has remained at 0.6 last checked 07/03/2017  Date Cr K Hgb  12/17     12/18 1.19 3.9    1/19 1.69 3.1 16.4  2/19 1.69 4.3         Thromboembolic risk factors ( age  -2, DM-1, Vasc disease -1, CHF-1, Gender-1) for a CHADSVASc Score of 6  She has been having less Afib, still about 10-15% but feels much better than a few months ago.  Weight up and down and seems to respnd to increasing doses of diuretic   Amio now off and she  reminds me taht her dyspnea worsened with increasingly rapid rates and prior to amio initiation   She    Past Medical History:  Diagnosis Date  . Atrial fibrillation ([place])   . CAD (coronary artery disease)   . Cancer ([place])    skin  . CHF (congestive heart failure) (HCC)     A-Fib  . COPD (chronic obstructive pulmonary disease) ([place])   . Diabetes mellitus without complication ([place])   . GERD (gastroesophageal reflux disease)   . Hyperlipidemia   . Hypocalcemia   . Hypokalemia   . Macular degeneration   . Myocardial infarction ([place])   . 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 Sinus node dysfunction (HCC)   . Thyroid disease   . Vitamin B12 deficiency   . Vitamin D deficiency       Surgical History:  Past Surgical History:  Procedure Laterality Date  . CHOLECYSTECTOMY    . COLONOSCOPY  2015   1 polyp removed- repeat 3 years  . CORONARY ANGIOPLASTY WITH STENT PLACEMENT    . EP IMPLANTABLE DEVICE N/A 11/10/2015   Procedure: Pacemaker Implant;  Surgeon: Will [name], MD;  Location: [facility];  Service: Cardiovascular;  Laterality: N/A;  . EP IMPLANTABLE DEVICE N/A 11/11/2015  Procedure: Lead Revision/Repair;  Surgeon: Evans Lance, MD;  Location: [facility];  Service: Cardiovascular;  Laterality: N/A;  . parathyroid surgery     x 2  . RIGHT/LEFT HEART CATH AND CORONARY ANGIOGRAPHY N/A 05/03/2017   Procedure: RIGHT/LEFT HEART CATH AND CORONARY ANGIOGRAPHY;  Surgeon: Leonie Man, MD;  Location: [facility];  Service: Cardiovascular;  Laterality: N/A;  . THYROID SURGERY    . TOTAL SHOULDER ARTHROPLASTY Right    x 2  . VAGINAL HYSTERECTOMY       Home Meds: Prior to Admission medications   Medication Sig Start Date End Date Taking? Authorizing Provider  albuterol (PROVENTIL HFA;VENTOLIN HFA) 108 (90 Base) MCG/ACT inhaler Inhale 2 puffs into the lungs every 6 (six) hours as needed for wheezing or shortness of breath.   Yes  Historical Provider, MD  ARTIFICIAL TEAR OP Place 1 drop into both eyes as needed (for dry eyes).   Yes Historical Provider, MD  Cholecalciferol (VITAMIN D3) 5000 units TABS Take 1 tablet by mouth every morning.   Yes Historical Provider, MD  furosemide (LASIX) 20 MG tablet Take 60 mg by mouth every morning.   Yes Historical Provider, MD  isosorbide mononitrate (IMDUR) 30 MG 24 hr tablet Take 1 tablet (30 mg total) by mouth daily. 02/11/16  Yes Will [name], MD  levothyroxine (SYNTHROID, LEVOTHROID) 150 MCG tablet Take 1 tablet (150 mcg total) by mouth daily. 12/08/15  Yes Glean Hess, MD  Melatonin 5 MG TABS Take 1 tablet by mouth at bedtime.   Yes Historical Provider, MD  metFORMIN (GLUCOPHAGE) 500 MG tablet Take 1 tablet (500 mg total) by mouth 2 (two) times daily with a meal. 09/16/15  Yes Adline Potter, MD  metoprolol succinate (TOPROL XL) 25 MG 24 hr tablet Take 1 tablet (25 mg total) by mouth daily. 02/11/16  Yes Will [name], MD  nitroGLYCERIN (NITROSTAT) 0.4 MG SL tablet Place 0.4 mg under the tongue every 5 (five) minutes as needed for chest pain.   Yes Historical Provider, MD  pantoprazole (PROTONIX) 40 MG tablet Take 1 tablet (40 mg total) by mouth daily. 10/16/15  Yes Juline Patch, MD  potassium chloride (KLOR-CON) 8 MEQ tablet Take 1 tablet (8 mEq total) by mouth 2 (two) times daily. 12/21/15  Yes Glean Hess, MD  rosuvastatin (CRESTOR) 40 MG tablet Take 1 tablet (40 mg total) by mouth daily. 09/18/15  Yes Minna Merritts, MD  sitaGLIPtin (JANUVIA) 50 MG tablet Take 50 mg by mouth daily.   Yes Historical Provider, MD  vitamin B-12 (CYANOCOBALAMIN) 500 MCG tablet Take 500 mcg by mouth daily.   Yes Historical Provider, MD  warfarin (COUMADIN) 7.5 MG tablet Take 1 tablet (7.5 mg total) by mouth daily. 01/20/16  Yes Minna Merritts, MD    Allergies:  Allergies  Allergen Reactions  . Demerol [Meperidine] Anaphylaxis    Tolerated Fentanyl 11/10/15  . Multaq  [Dronedarone] Diarrhea  . Sulfa Antibiotics Anaphylaxis  . Tetracyclines & Related Other (See Comments) and Swelling    Made nose, lips,  And tongue itchy Made nose, lips,  And tongue itchy    Social History   Socioeconomic History  . Marital status: Married    Spouse name: Not on file  . Number of children: Not on file  . Years of education: Not on file  . Highest education level: Not on file  Occupational History  . Occupation: Retired  Scientific laboratory technician  . Financial  resource strain: Not on file  . Food insecurity:    Worry: Not on file    Inability: Not on file  . Transportation needs:    Medical: Not on file    Non-medical: Not on file  Tobacco Use  . Smoking status: Never Smoker  . Smokeless tobacco: Never Used  Substance and Sexual Activity  . Alcohol use: Yes    Alcohol/week: 0.0 oz  . Drug use: No  . Sexual activity: Not on file  Lifestyle  . Physical activity:    Days per week: Not on file    Minutes per session: Not on file  . Stress: Not on file  Relationships  . Social connections:    Talks on phone: Not on file    Gets together: Not on file    Attends religious service: Not on file    Active member of club or organization: Not on file    Attends meetings of clubs or organizations: Not on file    Relationship status: Not on file  . Intimate partner violence:    Fear of current or ex partner: Not on file    Emotionally abused: Not on file    Physically abused: Not on file    Forced sexual activity: Not on file  Other Topics Concern  . Not on file  Social History Narrative   Lives in [place] w/ husband.     Family History  Problem Relation Age of Onset  . Thyroid disease Mother 23       3 days after surgery  . Heart defect Father 26       ?ascending aortic aneurysm     ROS:  Please see the history of present illness.     All other systems reviewed and negative.   BP 102/62 (BP Location: Left Arm, Patient Position: Sitting, Cuff Size: Large)    Pulse 64   Ht 5\' 3"  (1.6 m)   Wt 206 lb 12 oz (93.8 kg)   BMI 36.62 kg/m  Well developed and nourished in no acute distress HENT normal Neck supple with JVP-flat Carotids brisk and full without bruits Clear Regular rate and rhythm, no murmurs or gallops Abd-soft with active BS without hepatomegaly No Clubbing cyanosis edema Skin-warm and dry A & Oriented  Grossly normal sensory and motor function    Labs: Cardiac Enzymes No results for input(s): CKTOTAL, CKMB, TROPONINI in the last 72 hours. CBC Lab Results  Component Value Date   WBC 5.9 05/04/2017   HGB 12.3 05/04/2017   HCT 38.4 05/04/2017   MCV 86.7 05/04/2017   PLT 211 05/04/2017   PROTIME: No results for input(s): LABPROT, INR in the last 72 hours. Chemistry  No results for input(s): NA, K, CL, CO2, BUN, CREATININE, CALCIUM, PROT, BILITOT, ALKPHOS, ALT, AST, GLUCOSE in the last 168 hours.  Invalid input(s): LABALBU Lipids Lab Results  Component Value Date   CHOL 127 04/19/2016   HDL 42 04/19/2016   LDLCALC 43 04/19/2016   TRIG 209 (H) 04/19/2016   BNP No results found for: PROBNP Thyroid Function Tests: No results for input(s): TSH, T4TOTAL, T3FREE, THYROIDAB in the last 72 hours.  Invalid input(s): FREET3 Miscellaneous Lab Results  Component Value Date   DDIMER <0.27 04/28/2017    Radiology/Studies:  No results found.  EKG: AV pacing  Assessment and Plan:  Sick Sinus syndrome  Persistent Atrial Fib with prior PVI  Pacemaker St Jude   OSA  P-wave oversensing resulting false detection of  atrial fibrillation  HFpEF   chronic   Coronary artery disease with prior stenting  Renal insufficiency-improved   Hyperthyroidism-iatrogenic   Without symptoms of ischemia  Euvolemic continue current med  She will adjust her diuretics based on weight changes  She is having less symptomatic but still relatively frequent afib, so will have reconnect with Dr Carlyn Reichert to consider repeat  ablation  Will check amio level to see if dofetilide an alternative \ Endo now on board and will be following her TSH  We spent more than 50% of our >25 min visit in face to face counseling regarding the above      Virl Axe

## 2017-07-27 LAB — AMIODARONE LEVEL
Amiodarone, Serum: 0.3 ug/mL — ABNORMAL LOW (ref 1.0–2.5)
N-DESETHYL-AMIODARONE: NOT DETECTED ug/mL (ref 1.0–2.5)

## 2017-07-28 ENCOUNTER — Telehealth: Payer: Self-pay | Admitting: Internal Medicine

## 2017-07-28 NOTE — Telephone Encounter (Signed)
Will forward to have pt scheduled to follow up with [facility] and discuss ablation

## 2017-07-28 NOTE — Telephone Encounter (Signed)
New Message   Pt states she is calling to check on the ablation that was suppose to be schedule with Dr. Curt Bears per Dr. Caryl Comes. Please call

## 2017-07-31 DIAGNOSIS — E782 Mixed hyperlipidemia: Secondary | ICD-10-CM | POA: Diagnosis not present

## 2017-07-31 DIAGNOSIS — E890 Postprocedural hypothyroidism: Secondary | ICD-10-CM | POA: Diagnosis not present

## 2017-07-31 DIAGNOSIS — E1165 Type 2 diabetes mellitus with hyperglycemia: Secondary | ICD-10-CM | POA: Diagnosis not present

## 2017-07-31 DIAGNOSIS — N183 Chronic kidney disease, stage 3 (moderate): Secondary | ICD-10-CM | POA: Diagnosis not present

## 2017-08-01 ENCOUNTER — Inpatient Hospital Stay
Admission: RE | Admit: 2017-08-01 | Discharge: 2017-08-01 | Disposition: A | Discharge status: Home / Self Care | Payer: Self-pay | Source: Ambulatory Visit | Attending: *Deleted | Admitting: *Deleted

## 2017-08-01 ENCOUNTER — Other Ambulatory Visit: Payer: Self-pay | Admitting: Internal Medicine

## 2017-08-01 ENCOUNTER — Other Ambulatory Visit: Payer: Self-pay | Admitting: *Deleted

## 2017-08-01 ENCOUNTER — Telehealth: Payer: Self-pay | Admitting: Internal Medicine

## 2017-08-01 DIAGNOSIS — Z9289 Personal history of other medical treatment: Secondary | ICD-10-CM

## 2017-08-01 NOTE — Physician Orders (Signed)
This examination belongs to an outside facility and is stored here for comparison purposes only.  Contact the originating outside institution for any associated report or interpretation. 

## 2017-08-01 NOTE — Telephone Encounter (Signed)
Notes recorded by Emily Filbert, RN on 08/01/2017 at 4:52 PM EDT I called and spoke with the patient. She is aware of her results and that her options are now: 1) [facility] admission, or 2) See Dr. Curt Bears to talk about possible a-fib ablation  The patient has opted to talk to Dr. Curt Bears first, but I am also mailing her the information sheet on "preparing for [facility] Admission" so she will have this on hand. The patient is agreeable and verbalizes understanding. She is scheduled to see Dr. Curt Bears on 6/3. ------  Notes recorded by Deboraha Sprang, MD on 07/31/2017 at 9:11 AM EDT Please inform patient that drug levels now would permit dofetide We can initiate taht; She is scheduled to see [facility] for reconsideration of ablation If she is leaning towards that, would hold off on dofetilide, if towards drug, then would start it Thanks

## 2017-08-09 ENCOUNTER — Telehealth: Payer: Self-pay | Admitting: Internal Medicine

## 2017-08-09 NOTE — Telephone Encounter (Signed)
I called and spoke with the patient and advised her that I mailed out the [facility] information sheet to her on 08/01/17. She is aware I will put another at the front desk for her pick up. She is agreeable.

## 2017-08-09 NOTE — Telephone Encounter (Signed)
Pt calling stating she spoke to nurse last week and we were to send out [facility] pamphlet for her to read over and she is calling for she has not received anything yet   Would like to know if we mailed it to her yet or not  Please advise

## 2017-08-10 DIAGNOSIS — L570 Actinic keratosis: Secondary | ICD-10-CM | POA: Diagnosis not present

## 2017-08-14 ENCOUNTER — Encounter: Payer: Self-pay | Admitting: Cardiology

## 2017-08-14 ENCOUNTER — Ambulatory Visit (INDEPENDENT_AMBULATORY_CARE_PROVIDER_SITE_OTHER): Payer: Medicare Other | Admitting: Cardiology

## 2017-08-14 ENCOUNTER — Other Ambulatory Visit: Payer: Self-pay | Admitting: Internal Medicine

## 2017-08-14 VITALS — BP 124/78 | HR 64 | Ht 63.0 in | Wt 213.2 lb

## 2017-08-14 DIAGNOSIS — G4733 Obstructive sleep apnea (adult) (pediatric): Secondary | ICD-10-CM

## 2017-08-14 DIAGNOSIS — I2510 Atherosclerotic heart disease of native coronary artery without angina pectoris: Secondary | ICD-10-CM | POA: Diagnosis not present

## 2017-08-14 DIAGNOSIS — R001 Bradycardia, unspecified: Secondary | ICD-10-CM | POA: Diagnosis not present

## 2017-08-14 DIAGNOSIS — Z1231 Encounter for screening mammogram for malignant neoplasm of breast: Secondary | ICD-10-CM

## 2017-08-14 DIAGNOSIS — I481 Persistent atrial fibrillation: Secondary | ICD-10-CM

## 2017-08-14 DIAGNOSIS — I4819 Other persistent atrial fibrillation: Secondary | ICD-10-CM

## 2017-08-14 DIAGNOSIS — I480 Paroxysmal atrial fibrillation: Secondary | ICD-10-CM | POA: Diagnosis not present

## 2017-08-14 DIAGNOSIS — I495 Sick sinus syndrome: Secondary | ICD-10-CM | POA: Diagnosis not present

## 2017-08-14 LAB — CUP PACEART INCLINIC DEVICE CHECK
Battery Remaining Longevity: 109 mo
Brady Statistic RA Percent Paced: 83 %
Brady Statistic RV Percent Paced: 98 %
Date Time Interrogation Session: 20190603142113
Implantable Lead Implant Date: 20170829
Implantable Lead Location: 753859
Lead Channel Impedance Value: 587.5 Ohm
Lead Channel Pacing Threshold Amplitude: 1 V
Lead Channel Pacing Threshold Pulse Width: 0.4 ms
Lead Channel Sensing Intrinsic Amplitude: 12 mV
Lead Channel Sensing Intrinsic Amplitude: 3.6 mV
Lead Channel Setting Pacing Amplitude: 2 V
MDC IDC LEAD IMPLANT DT: 20170829
MDC IDC LEAD LOCATION: 753860
MDC IDC MSMT BATTERY VOLTAGE: 3.01 V
MDC IDC MSMT LEADCHNL RA IMPEDANCE VALUE: 437.5 Ohm
MDC IDC MSMT LEADCHNL RV PACING THRESHOLD AMPLITUDE: 1.125 V
MDC IDC MSMT LEADCHNL RV PACING THRESHOLD PULSEWIDTH: 0.4 ms
MDC IDC PG IMPLANT DT: 20170829
MDC IDC PG SERIAL: 7945290
MDC IDC SET LEADCHNL RV PACING AMPLITUDE: 1.375
MDC IDC SET LEADCHNL RV PACING PULSEWIDTH: 0.4 ms
MDC IDC SET LEADCHNL RV SENSING SENSITIVITY: 2 mV
Pulse Gen Model: 2272

## 2017-08-14 NOTE — Progress Notes (Signed)
Electrophysiology Office Note   Date:  08/14/2017   ID:  Fiana Gladu, DOB 1941/01/08, MRN [id number]  PCP:  Perrin Maltese, MD  Cardiologist:  Rockey Situ Primary Electrophysiologist:  Barnetta Hammersmith [name], MD    Chief Complaint  Patient presents with  . Pacemaker Check    Persistent Afib     History of Present Illness: Christl Fessenden is a 77 y.o. female who presents today for electrophysiology evaluation.   PMHx of CAD (remote stent, patent by cath 2014), PAFib reportedly with an ablation in 2013, COPD, DM, HLD, hypothyroidism, reported history of bradycardia. Was noted to have 5 second pauses on a monitor and a dual chamber pacemaker was placed 11/09/15. Had RA lead revision due to dislodgement.   Today, denies symptoms of chest pain, shortness of breath, orthopnea, PND, lower extremity edema, claudication, dizziness, presyncope, syncope, bleeding, or neurologic sequela. The patient is tolerating medications without difficulties.  She has noted more frequent episodes of atrial fibrillation.  She has had up to 3 episodes that caused her to be weak, fatigued, and short of breath since mid May.  These of lasted up to 12 hours on device interrogation.  She was initially on amiodarone but was stopped as it was thought that she would either be loaded on dofetilide or go for ablation.   Past Medical History:  Diagnosis Date  . Atrial fibrillation ([place])   . CAD (coronary artery disease)   . Cancer ([place])    skin  . CHF (congestive heart failure) (HCC)     A-Fib  . COPD (chronic obstructive pulmonary disease) ([place])   . Diabetes mellitus without complication ([place])   . GERD (gastroesophageal reflux disease)   . Hyperlipidemia   . Hypocalcemia   . Hypokalemia   . Macular degeneration   . Myocardial infarction ([place])   . 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 Sinus node dysfunction (HCC)   . Thyroid disease   . Vitamin B12 deficiency   . Vitamin D deficiency     Past Surgical History:  Procedure Laterality Date  . CHOLECYSTECTOMY    . COLONOSCOPY  2015   1 polyp removed- repeat 3 years  . CORONARY ANGIOPLASTY WITH STENT PLACEMENT    . EP IMPLANTABLE DEVICE N/A 11/10/2015   Procedure: Pacemaker Implant;  Surgeon: Laverne Hursey [name], MD;  Location: [facility];  Service: Cardiovascular;  Laterality: N/A;  . EP IMPLANTABLE DEVICE N/A 11/11/2015   Procedure: Lead Revision/Repair;  Surgeon: Evans Lance, MD;  Location: [facility];  Service: Cardiovascular;  Laterality: N/A;  . parathyroid surgery     x 2  . RIGHT/LEFT HEART CATH AND CORONARY ANGIOGRAPHY N/A 05/03/2017   Procedure: RIGHT/LEFT HEART CATH AND CORONARY ANGIOGRAPHY;  Surgeon: Leonie Man, MD;  Location: [facility];  Service: Cardiovascular;  Laterality: N/A;  . THYROID SURGERY    . TOTAL SHOULDER ARTHROPLASTY Right    x 2  . VAGINAL HYSTERECTOMY       Current Outpatient Medications  Medication Sig Dispense Refill  . apixaban (ELIQUIS) 5 MG TABS tablet Take 1 tablet (5 mg total) 2 (two) times daily by mouth. 180 tablet 3  . Cholecalciferol (VITAMIN D3) 5000 units TABS Take 1 tablet by mouth every morning.    Marland Kitchen glimepiride (AMARYL) 4 MG tablet Take 4 mg by mouth 2 (two) times daily.    . Insulin Glargine (LANTUS SOLOSTAR) 100 UNIT/ML Solostar Pen Inject 15 Units into the skin  at bedtime.     Marland Kitchen levothyroxine (SYNTHROID, LEVOTHROID) 150 MCG tablet Take 1 tablet (150 mcg total) by mouth daily before breakfast.    . Multiple Vitamins-Minerals (ICAPS AREDS 2) CAPS Take 1 capsule by mouth 2 (two) times daily.    . nitroGLYCERIN (NITROSTAT) 0.4 MG SL tablet Place 0.4 mg under the tongue every 5 (five) minutes as needed for chest pain.    . pantoprazole (PROTONIX) 40 MG tablet Take 1 tablet (40 mg total) by mouth daily. 90 tablet 3  . potassium chloride (KLOR-CON) 8 MEQ tablet Take 2 tablets (16 meq) in the morning & 1 tablet (8 meq) in the evening 270 tablet 3  .  rosuvastatin (CRESTOR) 40 MG tablet TAKE 1 TABLET BY MOUTH ONCE DAILY 90 tablet 3  . torsemide (DEMADEX) 20 MG tablet Take 2 tablets (40 mg total) by mouth daily. 60 tablet 0  . vitamin B-12 (CYANOCOBALAMIN) 500 MCG tablet Take 500 mcg by mouth daily.     No current facility-administered medications for this visit.     Allergies:   Demerol [meperidine]; Multaq [dronedarone]; Sulfa antibiotics; and Tetracyclines & related   Social History:  The patient  reports that she has never smoked. She has never used smokeless tobacco. She reports that she drinks alcohol. She reports that she does not use drugs.   Family History:  The patient's family history includes Heart defect (age of onset: 22) in her father; Thyroid disease (age of onset: 58) in her mother.   ROS:  Please see the history of present illness.   Otherwise, review of systems is positive for leg swelling, palpitations, shortness of breath, diarrhea, back pain, muscle pain, headaches.   All other systems are reviewed and negative.   PHYSICAL EXAM: VS:  BP 124/78   Pulse 64   Ht 5\' 3"  (1.6 m)   Wt 213 lb 3.2 oz (96.7 kg)   SpO2 95%   BMI 37.77 kg/m  , BMI Body mass index is 37.77 kg/m. GEN: Well nourished, well developed, in no acute distress  HEENT: normal  Neck: no JVD, carotid bruits, or masses Cardiac: RRR; no murmurs, rubs, or gallops,no edema  Respiratory:  clear to auscultation bilaterally, normal work of breathing GI: soft, nontender, nondistended, + BS MS: no deformity or atrophy  Skin: warm and dry, device site well healed Neuro:  Strength and sensation are intact Psych: euthymic mood, full affect  EKG:  EKG is not ordered today. Personal review of the ekg ordered 07/25/2017 shows AV paced  Personal review of the device interrogation today. Results in [place]: 04/25/2017: B Natriuretic Peptide 57.0 04/26/2017: TSH 0.198 05/04/2017: ALT 50; Hemoglobin 12.3; Platelets 211 05/16/2017: Magnesium  2.4 05/19/2017: BUN 20; Creatinine, Ser 1.39; Potassium 3.8; Sodium 139    Lipid Panel     Component Value Date/Time   CHOL 127 04/19/2016 0857   TRIG 209 (H) 04/19/2016 0857   HDL 42 04/19/2016 0857   CHOLHDL 3.0 04/19/2016 0857   LDLCALC 43 04/19/2016 0857     Wt Readings from Last 3 Encounters:  08/14/17 213 lb 3.2 oz (96.7 kg)  07/25/17 206 lb 12 oz (93.8 kg)  05/16/17 198 lb 2 oz (89.9 kg)      Other studies Reviewed: Additional studies/ records that were reviewed today include: TTE 04/29/17 Review of the above records today demonstrates:  - Left ventricle: The cavity size was normal. Wall thickness was   increased in a pattern of mild LVH.  Systolic function was normal.   The estimated ejection fraction was in the range of 50% to 55%.   Doppler parameters are consistent with abnormal left ventricular   relaxation (grade 1 diastolic dysfunction). The E/e&' ratio is   between 8-15, suggesting indeterminate LV filling pressure. - Aortic valve: Trileaflet. Sclerosis without stenosis. There was   trivial regurgitation. - Mitral valve: Mildly thickened leaflets . There was trivial   regurgitation. - Left atrium: The atrium was normal in size. - Inferior vena cava: The vessel was normal in size. The   respirophasic diameter changes were in the normal range (>= 50%),   consistent with normal central venous pressure.  [place] 05/03/17  Relatively normal right heart cath pressures with only mildly elevated LVEDP/PCP.  Prox LAD to Mid LAD lesion is 45% stenosed. Mid LAD lesion is 40% stenosed. Neither lesion was flow-limiting  Lat 2nd Diag lesion is 70% stenosed. Vessel is way too small for PCI  Previously placed Ost 1st Mrg stent (unknown type) is widely patent.  ASSESSMENT AND PLAN:  1.  Sick sinus syndrome: Status post Saint Jude dual-chamber pacemaker placed 2017.  Device functioning appropriately.  No changes.  2. Sleep apnea: Compliant with CPAP  3. Paroxysmal atrial  fibrillation: Device interrogation shows further episodes of atrial fibrillation.  She did have a prior ablation in [place].  I discussed with her the options of dofetilide loading versus repeat ablation.  Risks and benefits of ablation were discussed.  Risks include bleeding, tamponade, heart block, stroke, damage to surrounding organs.  She understands these risks and is agreed to the procedure.  This patients CHA2DS2-VASc Score and unadjusted Ischemic Stroke Rate (% per year) is equal to 4.8 % stroke rate/year from a score of 4  Above score calculated as 1 point each if present [CHF, HTN, DM, Vascular=MI/PAD/Aortic Plaque, Age if 65-74, or Female] Above score calculated as 2 points each if present [Age > 75, or Stroke/TIA/TE]  4.  Coronary artery disease: Stenting in 2014.  Recent heart catheterization  with stable disease.  No changes.  Current medicines are reviewed at length with the patient today.   The patient does not have concerns regarding her medicines.  The following changes were made today: None  Labs/ tests ordered today include:  Orders Placed This Encounter  Procedures  . CT CARDIAC MORPH/PULM VEIN W/CM&W/O CA SCORE  . CT CORONARY FRACTIONAL FLOW RESERVE DATA PREP  . CT CORONARY FRACTIONAL FLOW RESERVE FLUID ANALYSIS  . CUP PACEART INCLINIC DEVICE CHECK     Disposition:   FU with Shelma Eiben 3 months  Signed, Deegan Valentino [name], MD  08/14/2017 2:35 PM     [address] [place] [address] [phone] (office) [phone] (fax)

## 2017-08-14 NOTE — Patient Instructions (Addendum)
Medication Instructions:  Your physician recommends that you continue on your current medications as directed. Please refer to the Current Medication list given to you today.  Labwork: None ordered  Testing/Procedures: Your physician has requested that you have cardiac CT within 7 days PRIOR to your ablation. Cardiac computed tomography (CT) is a painless test that uses an x-ray machine to take clear, detailed pictures of your heart. For further information please visit [email or web address]. Please follow instruction sheet as given.   Your physician has recommended that you have an ablation. Catheter ablation is a medical procedure used to treat some cardiac arrhythmias (irregular heartbeats). During catheter ablation, a long, thin, flexible tube is put into a blood vessel in your groin (upper thigh), or neck. This tube is called an ablation catheter. It is then guided to your heart through the blood vessel. Radio frequency waves destroy small areas of heart tissue where abnormal heartbeats may cause an arrhythmia to start. Please see the instruction sheet given to you today.  Instructions for your ablation: 1. Please arrive at the [facility], Main Entrance "A", of [facility] at 5:30 a.m. on 10/05/2017. 2. Do not eat or drink after midnight the night prior to the procedure. 3. Do not miss any doses of ELIQUIS prior to the morning of the procedure.  4. Do not take any medications the morning of the procedure. 5. You will shaved for this procedure (if needed). Typically both groins, chest and back  are shaven. We ask that you shave these areas yourself at home 1-2 days prior  to the procedure. If you are uncomfortable/unable, then hospital staff will shave  these areas the morning of your procedure. 6. Plan for an overnight stay in the hospital. 7. You will need someone to drive you home at discharge.   Follow-Up: Keep your appointment on 09/13/17 @ 2:30 pm  Your physician recommends  that you schedule a follow-up appointment in: 4 weeks, after your procedure on 10/05/2017, with Roderic Palau in the AFib clinic.  Your physician recommends that you schedule a follow-up appointment in: 4 weeks, after your procedure on 10/05/2017, with Dr. Curt Bears.   * If you need a refill on your cardiac medications before your next appointment, please call your pharmacy.   *Please note that any paperwork needing to be filled out by the provider will need to be addressed at the front desk prior to seeing the provider. Please note that any FMLA, disability or other documents regarding health condition is subject to a $25.00 charge that must be received prior to completion of paperwork in the form of a money order or check.  Thank you for choosing [facility]!!   Trinidad Curet, RN [phone]  Any Other Special Instructions Will Be Listed Below (If Applicable).  Please arrive at the [facility] main entrance of [facility] at ______ AM (30-45 minutes prior to test start time)  [facility] [address] [place], [place] [address] [phone]  Proceed to the [facility] Radiology Department (First Floor).  Please follow these instructions carefully (unless otherwise directed):  Hold all erectile dysfunction medications at least 48 hours prior to test.  On the Night Before the Test: . Drink plenty of water. . Do not consume any caffeinated/decaffeinated beverages or chocolate 12 hours prior to your test. . Do not take any antihistamines 12 hours prior to your test. . If you take Metformin do not take 24 hours prior to test. . If the  patient has contrast allergy: ? Patient will need a prescription for Prednisone and very clear instructions (as follows): 1. Prednisone 50 mg - take 13 hours prior to test 2. Take another Prednisone 50 mg 7 hours prior to test 3. Take another Prednisone 50 mg 1 hour prior to test 4. Take Benadryl 50 mg 1 hour prior to  test . Patient must complete all four doses of above prophylactic medications. . Patient will need a ride after test due to Benadryl.  On the Day of the Test: . Drink plenty of water. Do not drink any water within one hour of the test. . Do not eat any food 4 hours prior to the test. . You may take your regular medications prior to the test. . IF NOT ON A BETA BLOCKER - Take 50 mg of lopressor (metoprolol) one hour before the test (this has been sent to your pharmacy) . HOLD Furosemide morning of the test.  After the Test: . Drink plenty of water. . After receiving IV contrast, you may experience a mild flushed feeling. This is normal. . On occasion, you may experience a mild rash up to 24 hours after the test. This is not dangerous. If this occurs, you can take Benadryl 25 mg and increase your fluid intake. . If you experience trouble breathing, this can be serious. If it is severe call 911 IMMEDIATELY. If it is mild, please call our office. . If you take any of these medications: Glipizide/Metformin, Avandament, Glucavance, please do not take 48 hours after completing test.    Cardiac Ablation Cardiac ablation is a procedure to disable (ablate) a small amount of heart tissue in very specific places. The heart has many electrical connections. Sometimes these connections are abnormal and can cause the heart to beat very fast or irregularly. Ablating some of the problem areas can improve the heart rhythm or return it to normal. Ablation may be done for people who:  Have Wolff-Parkinson-White syndrome.  Have fast heart rhythms (tachycardia).  Have taken medicines for an abnormal heart rhythm (arrhythmia) that were not effective or caused side effects.  Have a high-risk heartbeat that may be life-threatening.  During the procedure, a small incision is made in the neck or the groin, and a long, thin, flexible tube (catheter) is inserted into the incision and moved to the heart. Small  devices (electrodes) on the tip of the catheter will send out electrical currents. A type of X-ray (fluoroscopy) will be used to help guide the catheter and to provide images of the heart. Tell a health care provider about:  Any allergies you have.  All medicines you are taking, including vitamins, herbs, eye drops, creams, and over-the-counter medicines.  Any problems you or family members have had with anesthetic medicines.  Any blood disorders you have.  Any surgeries you have had.  Any medical conditions you have, such as kidney failure.  Whether you are pregnant or may be pregnant. What are the risks? Generally, this is a safe procedure. However, problems may occur, including:  Infection.  Bruising and bleeding at the catheter insertion site.  Bleeding into the chest, especially into the sac that surrounds the heart. This is a serious complication.  Stroke or blood clots.  Damage to other structures or organs.  Allergic reaction to medicines or dyes.  Need for a permanent pacemaker if the normal electrical system is damaged. A pacemaker is a small computer that sends electrical signals to the heart and helps your heart  beat normally.  The procedure not being fully effective. This may not be recognized until months later. Repeat ablation procedures are sometimes required.  What happens before the procedure?  Follow instructions from your health care provider about eating or drinking restrictions.  Ask your health care provider about: ? Changing or stopping your regular medicines. This is especially important if you are taking diabetes medicines or blood thinners. ? Taking medicines such as aspirin and ibuprofen. These medicines can thin your blood. Do not take these medicines before your procedure if your health care provider instructs you not to.  Plan to have someone take you home from the hospital or clinic.  If you will be going home right after the procedure,  plan to have someone with you for 24 hours. What happens during the procedure?  To lower your risk of infection: ? Your health care team will wash or sanitize their hands. ? Your skin will be washed with soap. ? Hair may be removed from the incision area.  An IV tube will be inserted into one of your veins.  You will be given a medicine to help you relax (sedative).  The skin on your neck or groin will be numbed.  An incision will be made in your neck or your groin.  A needle will be inserted through the incision and into a large vein in your neck or groin.  A catheter will be inserted into the needle and moved to your heart.  Dye may be injected through the catheter to help your surgeon see the area of the heart that needs treatment.  Electrical currents will be sent from the catheter to ablate heart tissue in desired areas. There are three types of energy that may be used to ablate heart tissue: ? Heat (radiofrequency energy). ? Laser energy. ? Extreme cold (cryoablation).  When the necessary tissue has been ablated, the catheter will be removed.  Pressure will be held on the catheter insertion area to prevent excessive bleeding.  A bandage (dressing) will be placed over the catheter insertion area. The procedure may vary among health care providers and hospitals. What happens after the procedure?  Your blood pressure, heart rate, breathing rate, and blood oxygen level will be monitored until the medicines you were given have worn off.  Your catheter insertion area will be monitored for bleeding. You will need to lie still for a few hours to ensure that you do not bleed from the catheter insertion area.  Do not drive for 24 hours or as long as directed by your health care provider. Summary  Cardiac ablation is a procedure to disable (ablate) a small amount of heart tissue in very specific places. Ablating some of the problem areas can improve the heart rhythm or return it  to normal.  During the procedure, electrical currents will be sent from the catheter to ablate heart tissue in desired areas. This information is not intended to replace advice given to you by your health care provider. Make sure you discuss any questions you have with your health care provider. Document Released: 07/17/2008 Document Revised: 01/18/2016 Document Reviewed: 01/18/2016 Elsevier Interactive Patient Education  [employer].

## 2017-08-17 ENCOUNTER — Encounter: Payer: Self-pay | Admitting: Internal Medicine

## 2017-08-31 DIAGNOSIS — H353221 Exudative age-related macular degeneration, left eye, with active choroidal neovascularization: Secondary | ICD-10-CM | POA: Diagnosis not present

## 2017-08-31 DIAGNOSIS — H35433 Paving stone degeneration of retina, bilateral: Secondary | ICD-10-CM | POA: Diagnosis not present

## 2017-08-31 DIAGNOSIS — H353112 Nonexudative age-related macular degeneration, right eye, intermediate dry stage: Secondary | ICD-10-CM | POA: Diagnosis not present

## 2017-08-31 DIAGNOSIS — H35033 Hypertensive retinopathy, bilateral: Secondary | ICD-10-CM | POA: Diagnosis not present

## 2017-09-06 ENCOUNTER — Ambulatory Visit
Admission: RE | Admit: 2017-09-06 | Discharge: 2017-09-06 | Disposition: A | Discharge status: Home / Self Care | Payer: Medicare Other | Source: Ambulatory Visit | Attending: Internal Medicine | Admitting: Internal Medicine

## 2017-09-06 DIAGNOSIS — Z1231 Encounter for screening mammogram for malignant neoplasm of breast: Secondary | ICD-10-CM | POA: Insufficient documentation

## 2017-09-06 LAB — CUP PACEART INCLINIC DEVICE CHECK
Implantable Lead Implant Date: 20170829
Implantable Lead Location: 753859
Implantable Pulse Generator Implant Date: 20170829
MDC IDC LEAD IMPLANT DT: 20170829
MDC IDC LEAD LOCATION: 753860
MDC IDC SESS DTM: 20190626102830
Pulse Gen Serial Number: 7945290

## 2017-09-12 NOTE — Progress Notes (Signed)
Electrophysiology Office Note   Date:  09/13/2017   ID:  Destine Zirkle, DOB 1940/06/30, MRN [id number]  PCP:  Perrin Maltese, MD  Cardiologist:  Rockey Situ Primary Electrophysiologist:  Barnetta Hammersmith [name], MD    Chief Complaint  Patient presents with  . Pacemaker Check    PAF/Sick sinus syndrome     History of Present Illness: Dawn Garcia is a 77 y.o. female who presents today for electrophysiology evaluation.   PMHx of CAD (remote stent, patent by cath 2014), PAFib reportedly with an ablation in 2013, COPD, DM, HLD, hypothyroidism, reported history of bradycardia. Was noted to have 5 second pauses on a monitor and a dual chamber pacemaker was placed 11/09/15. Had RA lead revision due to dislodgement.  She did have atrial fibrillation noted on her pacemaker and is planned for atrial fibrillation ablation 10/05/2017.  Today, denies symptoms of chest pain, shortness of breath, orthopnea, PND, lower extremity edema, claudication, dizziness, presyncope, syncope, bleeding, or neurologic sequela. The patient is tolerating medications without difficulties.  She is overall feeling well.  She noted more episodes of atrial fibrillation but of cause her fatigue, weakness, and palpitations.  Otherwise she is doing well and is ready to have the ablation performed.   Past Medical History:  Diagnosis Date  . Atrial fibrillation ([place])   . CAD (coronary artery disease)   . Cancer ([place])    skin  . CHF (congestive heart failure) (H[facility])     A-Fib  . COPD (chronic obstructive pulmonary disease) ([place])   . Diabetes mellitus without complication ([place])   . GERD (gastroesophageal reflux disease)   . Hyperlipidemia   . Hypocalcemia   . Hypokalemia   . Macular degeneration   . Myocardial infarction ([place])   . 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 Sinus node dysfunction (H[facility])   . Thyroid disease   . Vitamin B12 deficiency   . Vitamin D deficiency    Past Surgical History:    Procedure Laterality Date  . CHOLECYSTECTOMY    . COLONOSCOPY  2015   1 polyp removed- repeat 3 years  . CORONARY ANGIOPLASTY WITH STENT PLACEMENT    . EP IMPLANTABLE DEVICE N/A 11/10/2015   Procedure: Pacemaker Implant;  Surgeon: Manuel Lawhead [name], MD;  Location: [facility];  Service: Cardiovascular;  Laterality: N/A;  . EP IMPLANTABLE DEVICE N/A 11/11/2015   Procedure: Lead Revision/Repair;  Surgeon: Evans Lance, MD;  Location: [facility];  Service: Cardiovascular;  Laterality: N/A;  . parathyroid surgery     x 2  . RIGHT/LEFT HEART CATH AND CORONARY ANGIOGRAPHY N/A 05/03/2017   Procedure: RIGHT/LEFT HEART CATH AND CORONARY ANGIOGRAPHY;  Surgeon: Leonie Man, MD;  Location: [facility];  Service: Cardiovascular;  Laterality: N/A;  . THYROID SURGERY    . TOTAL SHOULDER ARTHROPLASTY Right    x 2  . VAGINAL HYSTERECTOMY       Current Outpatient Medications  Medication Sig Dispense Refill  . apixaban (ELIQUIS) 5 MG TABS tablet Take 1 tablet (5 mg total) 2 (two) times daily by mouth. 180 tablet 3  . Cholecalciferol (VITAMIN D3) 5000 units TABS Take 1 tablet by mouth every morning.    Marland Kitchen glimepiride (AMARYL) 4 MG tablet Take 4 mg by mouth 2 (two) times daily.    . Insulin Glargine (LANTUS SOLOSTAR) 100 UNIT/ML Solostar Pen Inject 15 Units into the skin at bedtime.     Marland Kitchen levothyroxine (SYNTHROID, LEVOTHROID) 150 MCG tablet  Take 1 tablet (150 mcg total) by mouth daily before breakfast.    . Multiple Vitamins-Minerals (ICAPS AREDS 2) CAPS Take 1 capsule by mouth 2 (two) times daily.    . nitroGLYCERIN (NITROSTAT) 0.4 MG SL tablet Place 0.4 mg under the tongue every 5 (five) minutes as needed for chest pain.    . pantoprazole (PROTONIX) 40 MG tablet Take 1 tablet (40 mg total) by mouth daily. 90 tablet 3  . potassium chloride (KLOR-CON) 8 MEQ tablet Take 2 tablets (16 meq) in the morning & 1 tablet (8 meq) in the evening 270 tablet 3  . rosuvastatin (CRESTOR) 40  MG tablet TAKE 1 TABLET BY MOUTH ONCE DAILY 90 tablet 3  . torsemide (DEMADEX) 20 MG tablet Take 2 tablets (40 mg total) by mouth daily. 60 tablet 0  . vitamin B-12 (CYANOCOBALAMIN) 500 MCG tablet Take 500 mcg by mouth daily.    . metoprolol tartrate (LOPRESSOR) 50 MG tablet Take 1 tablet (50 mg total) the morning of your CT test 1 tablet 0   No current facility-administered medications for this visit.     Allergies:   Demerol [meperidine]; Multaq [dronedarone]; Sulfa antibiotics; and Tetracyclines & related   Social History:  The patient  reports that she has never smoked. She has never used smokeless tobacco. She reports that she drinks alcohol. She reports that she does not use drugs.   Family History:  The patient's family history includes Breast cancer (age of onset: 76) in her sister; Heart defect (age of onset: 63) in her father; Thyroid disease (age of onset: 49) in her mother.   ROS:  Please see the history of present illness.   Otherwise, review of systems is positive for this, fatigue, palpitations.   All other systems are reviewed and negative.   PHYSICAL EXAM: VS:  BP 124/74   Pulse 68   Ht 5\' 3"  (1.6 m)   Wt 215 lb 6.4 oz (97.7 kg)   BMI 38.16 kg/m  , BMI Body mass index is 38.16 kg/m. GEN: Well nourished, well developed, in no acute distress  HEENT: normal  Neck: no JVD, carotid bruits, or masses Cardiac: RRR; no murmurs, rubs, or gallops,no edema  Respiratory:  clear to auscultation bilaterally, normal work of breathing GI: soft, nontender, nondistended, + BS MS: no deformity or atrophy  Skin: warm and dry, device site well healed Neuro:  Strength and sensation are intact Psych: euthymic mood, full affect  EKG:  EKG is not ordered today. Personal review of the ekg ordered 07/25/17 shows AV paced  Personal review of the device interrogation today. Results in [place]: 04/25/2017: B Natriuretic Peptide 57.0 04/26/2017: TSH 0.198 05/04/2017: ALT 50;  Hemoglobin 12.3; Platelets 211 05/16/2017: Magnesium 2.4 05/19/2017: BUN 20; Creatinine, Ser 1.39; Potassium 3.8; Sodium 139    Lipid Panel     Component Value Date/Time   CHOL 127 04/19/2016 0857   TRIG 209 (H) 04/19/2016 0857   HDL 42 04/19/2016 0857   CHOLHDL 3.0 04/19/2016 0857   LDLCALC 43 04/19/2016 0857     Wt Readings from Last 3 Encounters:  09/13/17 215 lb 6.4 oz (97.7 kg)  08/14/17 213 lb 3.2 oz (96.7 kg)  07/25/17 206 lb 12 oz (93.8 kg)      Other studies Reviewed: Additional studies/ records that were reviewed today include: TTE 04/29/17 Review of the above records today demonstrates:  - Left ventricle: The cavity size was normal. Wall thickness was   increased  in a pattern of mild LVH. Systolic function was normal.   The estimated ejection fraction was in the range of 50% to 55%.   Doppler parameters are consistent with abnormal left ventricular   relaxation (grade 1 diastolic dysfunction). The E/e&' ratio is   between 8-15, suggesting indeterminate LV filling pressure. - Aortic valve: Trileaflet. Sclerosis without stenosis. There was   trivial regurgitation. - Mitral valve: Mildly thickened leaflets . There was trivial   regurgitation. - Left atrium: The atrium was normal in size. - Inferior vena cava: The vessel was normal in size. The   respirophasic diameter changes were in the normal range (>= 50%),   consistent with normal central venous pressure.  [place] 05/03/17  Relatively normal right heart cath pressures with only mildly elevated LVEDP/PCP.  Prox LAD to Mid LAD lesion is 45% stenosed. Mid LAD lesion is 40% stenosed. Neither lesion was flow-limiting  Lat 2nd Diag lesion is 70% stenosed. Vessel is way too small for PCI  Previously placed Ost 1st Mrg stent (unknown type) is widely patent.  ASSESSMENT AND PLAN:  1.  Sick sinus syndrome: This post Circle dual-chamber pacemaker placed 2017.  Functioning appropriately.  No changes.    2. Sleep  apnea: Compliant with CPAP  3. Paroxysmal atrial fibrillation: Had prior ablation in [place].  Device interrogation shows 9% atrial fibrillation.  She has symptoms of weakness, fatigue, palpitations.  Plan for ablation 10/05/2017.  Risks and benefits were discussed and include bleeding, tamponade, heart block, stroke, damage to surrounding organs.  She understands risks and is agreed to the procedure.    This patients CHA2DS2-VASc Score and unadjusted Ischemic Stroke Rate (% per year) is equal to 4.8 % stroke rate/year from a score of 4  Above score calculated as 1 point each if present [CHF, HTN, DM, Vascular=MI/PAD/Aortic Plaque, Age if 65-74, or Female] Above score calculated as 2 points each if present [Age > 75, or Stroke/TIA/TE]   4.  Coronary artery disease: Tenting in 2014.  Recent cath with stable disease.  No changes.  Current medicines are reviewed at length with the patient today.   The patient does not have concerns regarding her medicines.  The following changes were made today: none  Labs/ tests ordered today include:  Orders Placed This Encounter  Procedures  . Basic Metabolic Panel (BMET)  . CBC     Disposition:   FU with Giulietta Prokop 3 months  Signed, Cynai Skeens [name], MD  09/13/2017 12:32 PM     [facility] [address] [place] [address] [phone] (office) [phone] (fax)

## 2017-09-13 ENCOUNTER — Ambulatory Visit (INDEPENDENT_AMBULATORY_CARE_PROVIDER_SITE_OTHER): Payer: Medicare Other | Admitting: Cardiology

## 2017-09-13 ENCOUNTER — Encounter: Payer: Self-pay | Admitting: Cardiology

## 2017-09-13 VITALS — BP 124/74 | HR 68 | Ht 63.0 in | Wt 215.4 lb

## 2017-09-13 DIAGNOSIS — I2510 Atherosclerotic heart disease of native coronary artery without angina pectoris: Secondary | ICD-10-CM

## 2017-09-13 DIAGNOSIS — Z01812 Encounter for preprocedural laboratory examination: Secondary | ICD-10-CM | POA: Diagnosis not present

## 2017-09-13 DIAGNOSIS — I495 Sick sinus syndrome: Secondary | ICD-10-CM

## 2017-09-13 DIAGNOSIS — G4733 Obstructive sleep apnea (adult) (pediatric): Secondary | ICD-10-CM

## 2017-09-13 DIAGNOSIS — I4819 Other persistent atrial fibrillation: Secondary | ICD-10-CM

## 2017-09-13 DIAGNOSIS — I481 Persistent atrial fibrillation: Secondary | ICD-10-CM

## 2017-09-13 MED ORDER — METOPROLOL TARTRATE 50 MG PO TABS: ORAL_TABLET | ORAL | 0 refills | Status: DC

## 2017-09-13 NOTE — Patient Instructions (Addendum)
Medication Instructions:  Your physician recommends that you continue on your current medications as directed. Please refer to the Current Medication list given to you today.  Labwork: Pre procedure labs today: BMET & CBC  Testing/Procedures: Your physician has requested that you have cardiac CT within 7 days PRIOR to your ablation. Cardiac computed tomography (CT) is a painless test that uses an x-ray machine to take clear, detailed pictures of your heart. For further information please visit [employer]. Please follow instruction sheet as given.  Keep your currently scheduled appointment on 7/19   Your physician has recommended that you have an ablation. Catheter ablation is a medical procedure used to treat some cardiac arrhythmias (irregular heartbeats). During catheter ablation, a long, thin, flexible tube is put into a blood vessel in your groin (upper thigh), or neck. This tube is called an ablation catheter. It is then guided to your heart through the blood vessel. Radio frequency waves destroy small areas of heart tissue where abnormal heartbeats may cause an arrhythmia to start. Please see the instruction sheet given to you today.  Instructions for your ablation: 1. Please arrive at the [facility], Main Entrance "A", of [facility] at 5:30 a.m. on 10/05/2017. 2. Do not eat or drink after midnight the night prior to the procedure. 3. Do not miss any doses of ELIQUIS prior to the morning of the procedure.  4. Do not take any medications the morning of the procedure. 5. You will shaved for this procedure (if needed). Typically both groins, chest and back     are shaven. We ask that you shave these areas yourself at home 1-2 days prior         to the procedure. If you are uncomfortable/unable, then hospital staff will shave  these areas the morning of your procedure. 6. Plan for an overnight stay in the hospital. 7. You will need someone to drive you home at  discharge.   Follow-Up: Your physician recommends that you schedule a follow-up appointment in: 3 months, after your procedure on 10/05/2017, with Dr. Curt Bears.   * If you need a refill on your cardiac medications before your next appointment, please call your pharmacy.   *Please note that any paperwork needing to be filled out by the provider will need to be addressed at the front desk prior to seeing the provider. Please note that any FMLA, disability or other documents regarding health condition is subject to a $25.00 charge that must be received prior to completion of paperwork in the form of a money order or check.  Thank you for choosing [facility]!!   Trinidad Curet, RN [phone]  Any Other Special Instructions Will Be Listed Below (If Applicable).  Please arrive at the [facility] main entrance of [facility] at ______ AM (30-45 minutes prior to test start time)  [facility] [address] [place], [place] [address] [phone]  Proceed to the [facility] Radiology Department (First Floor).  Please follow these instructions carefully (unless otherwise directed):  Hold all erectile dysfunction medications at least 48 hours prior to test.  On the Night Before the Test:  Drink plenty of water.  Do not consume any caffeinated/decaffeinated beverages or chocolate 12 hours prior to your test.  Do not take any antihistamines 12 hours prior to your test.  If you take Metformin do not take 24 hours prior to test.  If the patient has contrast allergy: ? Patient will need a prescription for  Prednisone and very clear instructions (as follows): 1. Prednisone 50 mg - take 13 hours prior to test 2. Take another Prednisone 50 mg 7 hours prior to test 3. Take another Prednisone 50 mg 1 hour prior to test 4. Take Benadryl 50 mg 1 hour prior to test  Patient must complete all four doses of above prophylactic medications.  Patient will  need a ride after test due to Benadryl.  On the Day of the Test:  Drink plenty of water. Do not drink any water within one hour of the test.  Do not eat any food 4 hours prior to the test.  You may take your regular medications prior to the test.  IF NOT ON A BETA BLOCKER - Take 50 mg of lopressor (metoprolol) one hour before the test (this has been sent to your pharmacy)  HOLD Furosemide morning of the test.  After the Test:  Drink plenty of water.  After receiving IV contrast, you may experience a mild flushed feeling. This is normal.  On occasion, you may experience a mild rash up to 24 hours after the test. This is not dangerous. If this occurs, you can take Benadryl 25 mg and increase your fluid intake.  If you experience trouble breathing, this can be serious. If it is severe call 911 IMMEDIATELY. If it is mild, please call our office.  If you take any of these medications: Glipizide/Metformin, Avandament, Glucavance, please do not take 48 hours after completing test.    Cardiac Ablation Cardiac ablation is a procedure to disable (ablate) a small amount of heart tissue in very specific places. The heart has many electrical connections. Sometimes these connections are abnormal and can cause the heart to beat very fast or irregularly. Ablating some of the problem areas can improve the heart rhythm or return it to normal. Ablation may be done for people who:  Have Wolff-Parkinson-White syndrome.  Have fast heart rhythms (tachycardia).  Have taken medicines for an abnormal heart rhythm (arrhythmia) that were not effective or caused side effects.  Have a high-risk heartbeat that may be life-threatening.   During the procedure, a small incision is made in the neck or the groin, and a long, thin, flexible tube (catheter) is inserted into the incision and moved to the heart. Small devices (electrodes) on the tip of the catheter will send out electrical currents. A type of  X-ray (fluoroscopy) will be used to help guide the catheter and to provide images of the heart. Tell a health care provider about:  Any allergies you have.  All medicines you are taking, including vitamins, herbs, eye drops, creams, and over-the-counter medicines.  Any problems you or family members have had with anesthetic medicines.  Any blood disorders you have.  Any surgeries you have had.  Any medical conditions you have, such as kidney failure.  Whether you are pregnant or may be pregnant. What are the risks? Generally, this is a safe procedure. However, problems may occur, including:  Infection.  Bruising and bleeding at the catheter insertion site.  Bleeding into the chest, especially into the sac that surrounds the heart. This is a serious complication.  Stroke or blood clots.  Damage to other structures or organs.  Allergic reaction to medicines or dyes.  Need for a permanent pacemaker if the normal electrical system is damaged. A pacemaker is a small computer that sends electrical signals to the heart and helps your heart beat normally.  The procedure not being fully effective. This  may not be recognized until months later. Repeat ablation procedures are sometimes required.  What happens before the procedure?  Follow instructions from your health care provider about eating or drinking restrictions.  Ask your health care provider about: ? Changing or stopping your regular medicines. This is especially important if you are taking diabetes medicines or blood thinners. ? Taking medicines such as aspirin and ibuprofen. These medicines can thin your blood. Do not take these medicines before your procedure if your health care provider instructs you not to.  Plan to have someone take you home from the hospital or clinic.  If you will be going home right after the procedure, plan to have someone with you for 24 hours. What happens during the procedure?  To lower  your risk of infection: ? Your health care team will wash or sanitize their hands. ? Your skin will be washed with soap. ? Hair may be removed from the incision area.  An IV tube will be inserted into one of your veins.  You will be given a medicine to help you relax (sedative).  The skin on your neck or groin will be numbed.  An incision will be made in your neck or your groin.  A needle will be inserted through the incision and into a large vein in your neck or groin.  A catheter will be inserted into the needle and moved to your heart.  Dye may be injected through the catheter to help your surgeon see the area of the heart that needs treatment.  Electrical currents will be sent from the catheter to ablate heart tissue in desired areas. There are three types of energy that may be used to ablate heart tissue: ? Heat (radiofrequency energy). ? Laser energy. ? Extreme cold (cryoablation).  When the necessary tissue has been ablated, the catheter will be removed.  Pressure will be held on the catheter insertion area to prevent excessive bleeding.  A bandage (dressing) will be placed over the catheter insertion area. The procedure may vary among health care providers and hospitals. What happens after the procedure?  Your blood pressure, heart rate, breathing rate, and blood oxygen level will be monitored until the medicines you were given have worn off.  Your catheter insertion area will be monitored for bleeding. You will need to lie still for a few hours to ensure that you do not bleed from the catheter insertion area.  Do not drive for 24 hours or as long as directed by your health care provider. Summary  Cardiac ablation is a procedure to disable (ablate) a small amount of heart tissue in very specific places. Ablating some of the problem areas can improve the heart rhythm or return it to normal.  During the procedure, electrical currents will be sent from the catheter to  ablate heart tissue in desired areas. This information is not intended to replace advice given to you by your health care provider. Make sure you discuss any questions you have with your health care provider. Document Released: 07/17/2008 Document Revised: 01/18/2016 Document Reviewed: 01/18/2016 Elsevier Interactive Patient Education  [employer].

## 2017-09-14 LAB — CBC
Hematocrit: 39.5 % (ref 34.0–46.6)
Hemoglobin: 12.6 g/dL (ref 11.1–15.9)
MCH: 27.2 pg (ref 26.6–33.0)
MCHC: 31.9 g/dL (ref 31.5–35.7)
MCV: 85 fL (ref 79–97)
PLATELETS: 194 10*3/uL (ref 150–450)
RBC: 4.63 x10E6/uL (ref 3.77–5.28)
RDW: 14.9 % (ref 12.3–15.4)
WBC: 6.7 10*3/uL (ref 3.4–10.8)

## 2017-09-14 LAB — BASIC METABOLIC PANEL
BUN/Creatinine Ratio: 16 (ref 12–28)
BUN: 22 mg/dL (ref 8–27)
CALCIUM: 9.1 mg/dL (ref 8.7–10.3)
CO2: 23 mmol/L (ref 20–29)
Chloride: 102 mmol/L (ref 96–106)
Creatinine, Ser: 1.36 mg/dL — ABNORMAL HIGH (ref 0.57–1.00)
GFR, EST AFRICAN AMERICAN: 44 mL/min/{1.73_m2} — AB (ref >59)
GFR, EST NON AFRICAN AMERICAN: 38 mL/min/{1.73_m2} — AB (ref >59)
Glucose: 170 mg/dL — ABNORMAL HIGH (ref 65–99)
POTASSIUM: 3.9 mmol/L (ref 3.5–5.2)
SODIUM: 140 mmol/L (ref 134–144)

## 2017-09-19 ENCOUNTER — Ambulatory Visit (INDEPENDENT_AMBULATORY_CARE_PROVIDER_SITE_OTHER): Payer: Medicare Other | Admitting: *Deleted

## 2017-09-19 DIAGNOSIS — I495 Sick sinus syndrome: Secondary | ICD-10-CM

## 2017-09-20 NOTE — Progress Notes (Signed)
Remote pacemaker transmission.   

## 2017-09-26 ENCOUNTER — Other Ambulatory Visit: Payer: Self-pay | Admitting: Cardiology

## 2017-09-29 ENCOUNTER — Ambulatory Visit (HOSPITAL_COMMUNITY): Payer: Medicare Other

## 2017-09-29 ENCOUNTER — Ambulatory Visit (HOSPITAL_COMMUNITY)
Admission: RE | Admit: 2017-09-29 | Discharge: 2017-09-29 | Disposition: A | Discharge status: Home / Self Care | Payer: Medicare Other | Source: Ambulatory Visit | Attending: Cardiology | Admitting: Cardiology

## 2017-09-29 DIAGNOSIS — Z950 Presence of cardiac pacemaker: Secondary | ICD-10-CM | POA: Diagnosis not present

## 2017-09-29 DIAGNOSIS — I700 Atherosclerosis of aorta: Secondary | ICD-10-CM | POA: Insufficient documentation

## 2017-09-29 DIAGNOSIS — I4819 Other persistent atrial fibrillation: Secondary | ICD-10-CM

## 2017-09-29 DIAGNOSIS — I481 Persistent atrial fibrillation: Secondary | ICD-10-CM | POA: Insufficient documentation

## 2017-09-29 DIAGNOSIS — I4891 Unspecified atrial fibrillation: Secondary | ICD-10-CM | POA: Diagnosis not present

## 2017-09-29 MED ORDER — IOPAMIDOL (ISOVUE-370) INJECTION 76%
INTRAVENOUS | Status: AC
Start: 2017-09-29 — End: 2017-09-29
  Administered 2017-09-29: 100 mL
  Filled 2017-09-29: qty 100

## 2017-10-02 ENCOUNTER — Ambulatory Visit (HOSPITAL_COMMUNITY): Payer: Medicare Other | Admitting: Nurse Practitioner

## 2017-10-02 DIAGNOSIS — H35033 Hypertensive retinopathy, bilateral: Secondary | ICD-10-CM | POA: Diagnosis not present

## 2017-10-02 DIAGNOSIS — H353221 Exudative age-related macular degeneration, left eye, with active choroidal neovascularization: Secondary | ICD-10-CM | POA: Diagnosis not present

## 2017-10-02 DIAGNOSIS — H353112 Nonexudative age-related macular degeneration, right eye, intermediate dry stage: Secondary | ICD-10-CM | POA: Diagnosis not present

## 2017-10-02 DIAGNOSIS — H35372 Puckering of macula, left eye: Secondary | ICD-10-CM | POA: Diagnosis not present

## 2017-10-03 LAB — CUP PACEART REMOTE DEVICE CHECK
Battery Remaining Longevity: 121 mo
Battery Remaining Percentage: 95.5 %
Battery Voltage: 3.01 V
Brady Statistic AP VS Percent: 1 %
Brady Statistic AS VS Percent: 1 %
Implantable Lead Implant Date: 20170829
Implantable Lead Location: 753860
Implantable Pulse Generator Implant Date: 20170829
Lead Channel Impedance Value: 430 Ohm
Lead Channel Pacing Threshold Amplitude: 0.75 V
Lead Channel Pacing Threshold Amplitude: 0.875 V
Lead Channel Pacing Threshold Pulse Width: 0.4 ms
Lead Channel Sensing Intrinsic Amplitude: 12 mV
Lead Channel Sensing Intrinsic Amplitude: 4.2 mV
Lead Channel Setting Pacing Amplitude: 1.125
Lead Channel Setting Pacing Pulse Width: 0.4 ms
Lead Channel Setting Sensing Sensitivity: 2 mV
MDC IDC LEAD IMPLANT DT: 20170829
MDC IDC LEAD LOCATION: 753859
MDC IDC MSMT LEADCHNL RA PACING THRESHOLD PULSEWIDTH: 0.4 ms
MDC IDC MSMT LEADCHNL RV IMPEDANCE VALUE: 630 Ohm
MDC IDC PG SERIAL: 7945290
MDC IDC SESS DTM: 20190709112605
MDC IDC SET LEADCHNL RA PACING AMPLITUDE: 1.75 V
MDC IDC STAT BRADY AP VP PERCENT: 85 %
MDC IDC STAT BRADY AS VP PERCENT: 14 %
MDC IDC STAT BRADY RA PERCENT PACED: 72 %
MDC IDC STAT BRADY RV PERCENT PACED: 96 %

## 2017-10-04 NOTE — Anesthesia Preprocedure Evaluation (Addendum)
Anesthesia Evaluation  Patient identified by MRN, date of birth, ID band Patient awake    Reviewed: Allergy & Precautions, H&P , NPO status , Patient's Chart, lab work & pertinent test results, reviewed documented beta blocker date and time   Airway Mallampati: II  TM Distance: >3 FB Neck ROM: full    Dental no notable dental hx. (+) Partial Upper, Missing, Teeth Intact   Pulmonary sleep apnea and Continuous Positive Airway Pressure Ventilation , COPD,    Pulmonary exam normal breath sounds clear to auscultation       Cardiovascular Exercise Tolerance: Good + CAD, + Past MI and +CHF  + dysrhythmias Atrial Fibrillation + pacemaker + Valvular Problems/Murmurs  Rhythm:regular Rate:Normal  ECHO 2/19 - Procedure narrative: Transthoracic echocardiography. Technically   difficult study with reduced echo windows. - Left ventricle: The cavity size was normal. Wall thickness was   increased in a pattern of mild LVH. Systolic function was normal.   The estimated ejection fraction was in the range of 50% to 55%.   Doppler parameters are consistent with abnormal left ventricular   relaxation (grade 1 diastolic dysfunction). The E/e&' ratio is   between 8-15, suggesting indeterminate LV filling pressure. - Aortic valve: Trileaflet. Sclerosis without stenosis. There was   trivial regurgitation. - Mitral valve: Mildly thickened leaflets . There was trivial   regurgitation. - Left atrium: The atrium was normal in size.   Neuro/Psych    GI/Hepatic GERD  Medicated,  Endo/Other  diabetesHypothyroidism   Renal/GU   negative genitourinary   Musculoskeletal  (+) Arthritis , Osteoarthritis,    Abdominal   Peds  Hematology negative hematology ROS (+)   Anesthesia Other Findings   Reproductive/Obstetrics negative OB ROS                          Anesthesia Physical Anesthesia Plan  ASA: III  Anesthesia Plan:  General   Post-op Pain Management:    Induction: Intravenous  PONV Risk Score and Plan: 3 and Treatment may vary due to age or medical condition, Ondansetron and Dexamethasone  Airway Management Planned: Oral ETT and LMA  Additional Equipment:   Intra-op Plan:   Post-operative Plan: Extubation in OR  Informed Consent: I have reviewed the patients History and Physical, chart, labs and discussed the procedure including the risks, benefits and alternatives for the proposed anesthesia with the patient or authorized representative who has indicated his/her understanding and acceptance.   Dental Advisory Given  Plan Discussed with: CRNA, Anesthesiologist and Surgeon  Anesthesia Plan Comments: (  )        Anesthesia Quick Evaluation

## 2017-10-05 ENCOUNTER — Other Ambulatory Visit: Payer: Self-pay

## 2017-10-05 ENCOUNTER — Encounter (HOSPITAL_COMMUNITY): Payer: Self-pay | Admitting: Certified Registered"

## 2017-10-05 ENCOUNTER — Encounter (HOSPITAL_COMMUNITY)
Admission: RE | Disposition: A | Discharge status: Home / Self Care | Payer: Self-pay | Source: Ambulatory Visit | Attending: Cardiology

## 2017-10-05 ENCOUNTER — Ambulatory Visit (HOSPITAL_COMMUNITY): Payer: Medicare Other | Admitting: Anesthesiology

## 2017-10-05 ENCOUNTER — Ambulatory Visit (HOSPITAL_COMMUNITY)
Admission: RE | Admit: 2017-10-05 | Discharge: 2017-10-06 | Disposition: A | Discharge status: Home / Self Care | Payer: Medicare Other | Source: Ambulatory Visit | Attending: Cardiology | Admitting: Cardiology

## 2017-10-05 DIAGNOSIS — J449 Chronic obstructive pulmonary disease, unspecified: Secondary | ICD-10-CM | POA: Insufficient documentation

## 2017-10-05 DIAGNOSIS — E119 Type 2 diabetes mellitus without complications: Secondary | ICD-10-CM | POA: Insufficient documentation

## 2017-10-05 DIAGNOSIS — I219 Acute myocardial infarction, unspecified: Secondary | ICD-10-CM | POA: Insufficient documentation

## 2017-10-05 DIAGNOSIS — Z79890 Hormone replacement therapy (postmenopausal): Secondary | ICD-10-CM | POA: Diagnosis not present

## 2017-10-05 DIAGNOSIS — G4733 Obstructive sleep apnea (adult) (pediatric): Secondary | ICD-10-CM | POA: Insufficient documentation

## 2017-10-05 DIAGNOSIS — Z794 Long term (current) use of insulin: Secondary | ICD-10-CM | POA: Diagnosis not present

## 2017-10-05 DIAGNOSIS — E039 Hypothyroidism, unspecified: Secondary | ICD-10-CM | POA: Diagnosis not present

## 2017-10-05 DIAGNOSIS — Z950 Presence of cardiac pacemaker: Secondary | ICD-10-CM | POA: Diagnosis not present

## 2017-10-05 DIAGNOSIS — I509 Heart failure, unspecified: Secondary | ICD-10-CM | POA: Insufficient documentation

## 2017-10-05 DIAGNOSIS — I495 Sick sinus syndrome: Secondary | ICD-10-CM | POA: Insufficient documentation

## 2017-10-05 DIAGNOSIS — Z885 Allergy status to narcotic agent status: Secondary | ICD-10-CM | POA: Insufficient documentation

## 2017-10-05 DIAGNOSIS — E785 Hyperlipidemia, unspecified: Secondary | ICD-10-CM | POA: Diagnosis not present

## 2017-10-05 DIAGNOSIS — I480 Paroxysmal atrial fibrillation: Secondary | ICD-10-CM | POA: Insufficient documentation

## 2017-10-05 DIAGNOSIS — I2510 Atherosclerotic heart disease of native coronary artery without angina pectoris: Secondary | ICD-10-CM | POA: Insufficient documentation

## 2017-10-05 DIAGNOSIS — Z79899 Other long term (current) drug therapy: Secondary | ICD-10-CM | POA: Diagnosis not present

## 2017-10-05 DIAGNOSIS — Z9889 Other specified postprocedural states: Secondary | ICD-10-CM | POA: Insufficient documentation

## 2017-10-05 DIAGNOSIS — Z882 Allergy status to sulfonamides status: Secondary | ICD-10-CM | POA: Insufficient documentation

## 2017-10-05 DIAGNOSIS — I4891 Unspecified atrial fibrillation: Secondary | ICD-10-CM | POA: Diagnosis not present

## 2017-10-05 DIAGNOSIS — M1990 Unspecified osteoarthritis, unspecified site: Secondary | ICD-10-CM | POA: Diagnosis not present

## 2017-10-05 HISTORY — DX: Essential (primary) hypertension: I10

## 2017-10-05 HISTORY — DX: Personal history of other diseases of the digestive system: Z87.19

## 2017-10-05 HISTORY — DX: Obstructive sleep apnea (adult) (pediatric): G47.33

## 2017-10-05 HISTORY — DX: Nonexudative age-related macular degeneration, right eye, stage unspecified: H35.3110

## 2017-10-05 HISTORY — DX: Obstructive sleep apnea (adult) (pediatric): Z99.89

## 2017-10-05 HISTORY — DX: Family history of other specified conditions: Z84.89

## 2017-10-05 HISTORY — DX: Exudative age-related macular degeneration, left eye, stage unspecified: H35.3220

## 2017-10-05 HISTORY — DX: Type 2 diabetes mellitus without complications: E11.9

## 2017-10-05 HISTORY — DX: Thyrotoxicosis with diffuse goiter without thyrotoxic crisis or storm: E05.00

## 2017-10-05 HISTORY — DX: Malignant melanoma of skin, unspecified: C43.9

## 2017-10-05 HISTORY — DX: Unspecified osteoarthritis, unspecified site: M19.90

## 2017-10-05 HISTORY — DX: Other disorders of lung: J98.4

## 2017-10-05 HISTORY — DX: Chronic kidney disease, unspecified: N18.9

## 2017-10-05 HISTORY — DX: Hypothyroidism, unspecified: E03.9

## 2017-10-05 HISTORY — PX: ATRIAL FIBRILLATION ABLATION: EP1191

## 2017-10-05 HISTORY — DX: Personal history of other medical treatment: Z92.89

## 2017-10-05 HISTORY — DX: Personal history of other diseases of the musculoskeletal system and connective tissue: Z87.39

## 2017-10-05 LAB — GLUCOSE, CAPILLARY
GLUCOSE-CAPILLARY: 171 mg/dL — AB (ref 70–99)
GLUCOSE-CAPILLARY: 186 mg/dL — AB (ref 70–99)
Glucose-Capillary: 229 mg/dL — ABNORMAL HIGH (ref 70–99)
Glucose-Capillary: 249 mg/dL — ABNORMAL HIGH (ref 70–99)

## 2017-10-05 LAB — POCT ACTIVATED CLOTTING TIME
ACTIVATED CLOTTING TIME: 313 s
ACTIVATED CLOTTING TIME: 290 s
ACTIVATED CLOTTING TIME: 301 s
Activated Clotting Time: 186 seconds

## 2017-10-05 SURGERY — ATRIAL FIBRILLATION ABLATION
Anesthesia: General

## 2017-10-05 MED ORDER — VITAMIN D3 125 MCG (5000 UT) PO TABS: 1.0000 | ORAL_TABLET | Freq: Every morning | ORAL | Status: DC

## 2017-10-05 MED ORDER — HEPARIN (PORCINE) IN NACL 1000-0.9 UT/500ML-% IV SOLN
INTRAVENOUS | Status: AC
  Filled 2017-10-05: qty 500

## 2017-10-05 MED ORDER — SODIUM CHLORIDE 0.9% FLUSH
3.0000 mL | Freq: Two times a day (BID) | INTRAVENOUS | Status: DC
  Administered 2017-10-05: 3 mL via INTRAVENOUS

## 2017-10-05 MED ORDER — DIPHENHYDRAMINE HCL 25 MG PO CAPS: 25.0000 mg | ORAL_CAPSULE | Freq: Every day | ORAL | Status: DC

## 2017-10-05 MED ORDER — HYDRALAZINE HCL 20 MG/ML IJ SOLN
10.0000 mg | Freq: Four times a day (QID) | INTRAMUSCULAR | Status: DC | PRN
  Filled 2017-10-05: qty 1

## 2017-10-05 MED ORDER — ACETAMINOPHEN 325 MG PO TABS
ORAL_TABLET | ORAL | Status: AC
  Filled 2017-10-05: qty 2

## 2017-10-05 MED ORDER — NITROGLYCERIN 0.4 MG SL SUBL: 0.4000 mg | SUBLINGUAL_TABLET | SUBLINGUAL | Status: DC | PRN

## 2017-10-05 MED ORDER — SODIUM CHLORIDE 0.9 % IV SOLN
INTRAVENOUS | Status: DC
  Administered 2017-10-05: 06:00:00 via INTRAVENOUS

## 2017-10-05 MED ORDER — DOBUTAMINE IN D5W 4-5 MG/ML-% IV SOLN
INTRAVENOUS | Status: AC
  Filled 2017-10-05: qty 250

## 2017-10-05 MED ORDER — LIDOCAINE 2% (20 MG/ML) 5 ML SYRINGE
INTRAMUSCULAR | Status: DC | PRN
  Administered 2017-10-05: 40 mg via INTRAVENOUS

## 2017-10-05 MED ORDER — ROSUVASTATIN CALCIUM 20 MG PO TABS
40.0000 mg | ORAL_TABLET | Freq: Every day | ORAL | Status: DC
  Administered 2017-10-05 – 2017-10-06 (×2): 40 mg via ORAL
  Filled 2017-10-05: qty 1
  Filled 2017-10-05: qty 2

## 2017-10-05 MED ORDER — PROTAMINE SULFATE 10 MG/ML IV SOLN
INTRAVENOUS | Status: DC | PRN
  Administered 2017-10-05: 40 mg via INTRAVENOUS

## 2017-10-05 MED ORDER — HEPARIN (PORCINE) IN NACL 1000-0.9 UT/500ML-% IV SOLN
INTRAVENOUS | Status: DC | PRN
  Administered 2017-10-05 (×5): 500 mL

## 2017-10-05 MED ORDER — HEPARIN SODIUM (PORCINE) 1000 UNIT/ML IJ SOLN
INTRAMUSCULAR | Status: DC | PRN
  Administered 2017-10-05: 3000 [IU] via INTRAVENOUS
  Administered 2017-10-05 (×2): 1000 [IU] via INTRAVENOUS
  Administered 2017-10-05: 14000 [IU] via INTRAVENOUS

## 2017-10-05 MED ORDER — ICAPS AREDS 2 PO CAPS: 1.0000 | ORAL_CAPSULE | Freq: Two times a day (BID) | ORAL | Status: DC

## 2017-10-05 MED ORDER — HEPARIN SODIUM (PORCINE) 1000 UNIT/ML IJ SOLN
INTRAMUSCULAR | Status: DC | PRN
  Administered 2017-10-05: 1000 [IU] via INTRAVENOUS

## 2017-10-05 MED ORDER — ESMOLOL HCL 100 MG/10ML IV SOLN
INTRAVENOUS | Status: DC | PRN
  Administered 2017-10-05: 20 mg via INTRAVENOUS

## 2017-10-05 MED ORDER — INSULIN ASPART PROT & ASPART (70-30 MIX) 100 UNIT/ML ~~LOC~~ SUSP
12.0000 [IU] | Freq: Two times a day (BID) | SUBCUTANEOUS | Status: DC
  Administered 2017-10-05: 12 [IU] via SUBCUTANEOUS
  Filled 2017-10-05: qty 10

## 2017-10-05 MED ORDER — LEVOTHYROXINE SODIUM 75 MCG PO TABS
150.0000 ug | ORAL_TABLET | Freq: Every day | ORAL | Status: DC
  Administered 2017-10-06: 150 ug via ORAL
  Filled 2017-10-05: qty 2
  Filled 2017-10-05: qty 1

## 2017-10-05 MED ORDER — BUPIVACAINE HCL (PF) 0.25 % IJ SOLN
INTRAMUSCULAR | Status: DC | PRN
  Administered 2017-10-05: 30 mL

## 2017-10-05 MED ORDER — DEXAMETHASONE SODIUM PHOSPHATE 10 MG/ML IJ SOLN
INTRAMUSCULAR | Status: DC | PRN
  Administered 2017-10-05: 5 mg via INTRAVENOUS

## 2017-10-05 MED ORDER — OFF THE BEAT BOOK
Freq: Once | Status: AC
  Administered 2017-10-05: 22:00:00
  Filled 2017-10-05: qty 1

## 2017-10-05 MED ORDER — HYDRALAZINE HCL 20 MG/ML IJ SOLN
INTRAMUSCULAR | Status: AC
  Filled 2017-10-05: qty 1

## 2017-10-05 MED ORDER — FENTANYL CITRATE (PF) 100 MCG/2ML IJ SOLN: 25.0000 ug | INTRAMUSCULAR | Status: DC | PRN

## 2017-10-05 MED ORDER — HEPARIN SODIUM (PORCINE) 1000 UNIT/ML IJ SOLN
INTRAMUSCULAR | Status: AC
  Filled 2017-10-05: qty 1

## 2017-10-05 MED ORDER — SODIUM CHLORIDE 0.9 % IV SOLN: 250.0000 mL | INTRAVENOUS | Status: DC | PRN

## 2017-10-05 MED ORDER — APIXABAN 5 MG PO TABS
5.0000 mg | ORAL_TABLET | Freq: Two times a day (BID) | ORAL | Status: DC
  Administered 2017-10-05 – 2017-10-06 (×2): 5 mg via ORAL
  Filled 2017-10-05 (×2): qty 1

## 2017-10-05 MED ORDER — PHENYLEPHRINE HCL 10 MG/ML IJ SOLN
INTRAMUSCULAR | Status: DC | PRN
  Administered 2017-10-05: 20 ug/min via INTRAVENOUS

## 2017-10-05 MED ORDER — ONDANSETRON HCL 4 MG/2ML IJ SOLN
INTRAMUSCULAR | Status: AC
  Filled 2017-10-05: qty 2

## 2017-10-05 MED ORDER — DIPHENHYDRAMINE HCL 25 MG PO CAPS
25.0000 mg | ORAL_CAPSULE | Freq: Four times a day (QID) | ORAL | Status: DC | PRN
  Administered 2017-10-05: 17:00:00 25 mg via ORAL
  Filled 2017-10-05: qty 1

## 2017-10-05 MED ORDER — ONDANSETRON HCL 4 MG/2ML IJ SOLN
INTRAMUSCULAR | Status: DC | PRN
  Administered 2017-10-05: 4 mg via INTRAVENOUS

## 2017-10-05 MED ORDER — DIPHENHYDRAMINE-APAP (SLEEP) 25-500 MG PO TABS: 2.0000 | ORAL_TABLET | Freq: Every day | ORAL | Status: DC

## 2017-10-05 MED ORDER — HYDRALAZINE HCL 20 MG/ML IJ SOLN
10.0000 mg | Freq: Once | INTRAMUSCULAR | Status: AC
  Administered 2017-10-05: 10 mg via INTRAVENOUS

## 2017-10-05 MED ORDER — PROPOFOL 10 MG/ML IV BOLUS
INTRAVENOUS | Status: DC | PRN
  Administered 2017-10-05: 100 mg via INTRAVENOUS
  Administered 2017-10-05: 30 mg via INTRAVENOUS

## 2017-10-05 MED ORDER — FENTANYL CITRATE (PF) 100 MCG/2ML IJ SOLN
INTRAMUSCULAR | Status: DC | PRN
  Administered 2017-10-05 (×2): 25 ug via INTRAVENOUS

## 2017-10-05 MED ORDER — TORSEMIDE 20 MG PO TABS
40.0000 mg | ORAL_TABLET | Freq: Every day | ORAL | Status: DC
  Administered 2017-10-06: 40 mg via ORAL
  Filled 2017-10-05: qty 2

## 2017-10-05 MED ORDER — ONDANSETRON HCL 4 MG/2ML IJ SOLN
4.0000 mg | Freq: Four times a day (QID) | INTRAMUSCULAR | Status: DC | PRN
  Administered 2017-10-05: 4 mg via INTRAVENOUS

## 2017-10-05 MED ORDER — MEPERIDINE HCL 25 MG/ML IJ SOLN: 6.2500 mg | INTRAMUSCULAR | Status: DC | PRN

## 2017-10-05 MED ORDER — ROCURONIUM BROMIDE 100 MG/10ML IV SOLN
INTRAVENOUS | Status: DC | PRN
  Administered 2017-10-05: 40 mg via INTRAVENOUS
  Administered 2017-10-05 (×2): 10 mg via INTRAVENOUS

## 2017-10-05 MED ORDER — SODIUM CHLORIDE 0.9% FLUSH: 3.0000 mL | INTRAVENOUS | Status: DC | PRN

## 2017-10-05 MED ORDER — PANTOPRAZOLE SODIUM 40 MG PO TBEC
40.0000 mg | DELAYED_RELEASE_TABLET | Freq: Every day | ORAL | Status: DC
  Administered 2017-10-05 – 2017-10-06 (×2): 40 mg via ORAL
  Filled 2017-10-05 (×2): qty 1

## 2017-10-05 MED ORDER — VITAMIN B-12 500 MCG PO TABS: 500.0000 ug | ORAL_TABLET | Freq: Every day | ORAL | Status: DC

## 2017-10-05 MED ORDER — SUGAMMADEX SODIUM 200 MG/2ML IV SOLN
INTRAVENOUS | Status: DC | PRN
  Administered 2017-10-05: 300 mg via INTRAVENOUS

## 2017-10-05 MED ORDER — ACETAMINOPHEN 325 MG PO TABS
650.0000 mg | ORAL_TABLET | ORAL | Status: DC | PRN
  Administered 2017-10-05 – 2017-10-06 (×4): 650 mg via ORAL
  Filled 2017-10-05 (×4): qty 2

## 2017-10-05 MED ORDER — BUPIVACAINE HCL (PF) 0.25 % IJ SOLN
INTRAMUSCULAR | Status: AC
  Filled 2017-10-05: qty 30

## 2017-10-05 MED ORDER — GLIMEPIRIDE 4 MG PO TABS
4.0000 mg | ORAL_TABLET | Freq: Two times a day (BID) | ORAL | Status: DC
  Filled 2017-10-05: qty 1

## 2017-10-05 SURGICAL SUPPLY — 21 items
BAG SNAP BAND KOVER 36X36 (MISCELLANEOUS) ×3 IMPLANT
BLANKET WARM UNDERBOD FULL ACC (MISCELLANEOUS) ×3 IMPLANT
CATH MAPPNG PENTARAY F 2-6-2MM (CATHETERS) ×1 IMPLANT
CATH SMTCH THERMOCOOL SF DF (CATHETERS) ×3 IMPLANT
CATH SOUNDSTAR 3D IMAGING (CATHETERS) ×3 IMPLANT
CATH WEBSTER BI DIR CS D-F CRV (CATHETERS) ×3 IMPLANT
COVER SWIFTLINK CONNECTOR (BAG) ×3 IMPLANT
PACK EP LATEX FREE (CUSTOM PROCEDURE TRAY) ×2
PACK EP LF (CUSTOM PROCEDURE TRAY) ×1 IMPLANT
PAD DEFIB LIFELINK (PAD) ×3 IMPLANT
PATCH CARTO3 (PAD) ×3 IMPLANT
PENTARAY F 2-6-2MM (CATHETERS) ×3
SHEATH AVANTI 11F 11CM (SHEATH) ×3 IMPLANT
SHEATH BAYLIS SUREFLEX  M 8.5 (SHEATH) ×4
SHEATH BAYLIS SUREFLEX M 8.5 (SHEATH) ×2 IMPLANT
SHEATH BAYLIS TRANSSEPTAL 98CM (NEEDLE) ×3 IMPLANT
SHEATH PINNACLE 7F 10CM (SHEATH) ×3 IMPLANT
SHEATH PINNACLE 8F 10CM (SHEATH) ×6 IMPLANT
SHEATH PINNACLE 9F 10CM (SHEATH) ×6 IMPLANT
SHIELD RADPAD SCOOP 12X17 (MISCELLANEOUS) ×3 IMPLANT
TUBING SMART ABLATE COOLFLOW (TUBING) ×3 IMPLANT

## 2017-10-05 NOTE — Progress Notes (Signed)
Site area: 2 rt fv sheaths Site Prior to Removal:  Level 0 Pressure Applied For: 20 minutes Manual:   yes Patient Status During Pull:  stable Post Pull Site:  Level 0 Post Pull Instructions Given:  yes Post Pull Pulses Present: rt dp palpable Dressing Applied:  Gauze and tegaderm Bedrest begins @  Comments:

## 2017-10-05 NOTE — Progress Notes (Signed)
Patient complained of pressure mid chest that later radiated to left breast. Discussion with Chanetta Marshall NP will monitor for now and evaluate in about an hour. Symptoms of pressure radiated to abdomen and chest pressure resolved about 20 minutes later. Patients abdominal pressure resolved within 30 minutes. Chanetta Marshall NP into see patient. Patient reported a rash on arms and nose. Assessed and observed petechia on right arm and nose. Denies any itching in either area. Dr. Curt Bears was on the unit and reported findings. After assessing patient, Se. Camnitz ordered benadryl and 25 mg po benadryl administered as ordered. Shortly after patient reported that numbness had resolved.  1900 Noted right arm had fading rash and appeared to be resolving.  Patient transferred to [place] bed 1 after report given to RN.

## 2017-10-05 NOTE — Anesthesia Postprocedure Evaluation (Signed)
Anesthesia Post Note  Patient: Dawn Garcia  Procedure(s) Performed: ATRIAL FIBRILLATION ABLATION (N/A )     Patient location during evaluation: PACU Anesthesia Type: General Level of consciousness: awake and alert Pain management: pain level controlled Vital Signs Assessment: post-procedure vital signs reviewed and stable Respiratory status: spontaneous breathing, nonlabored ventilation, respiratory function stable and patient connected to nasal cannula oxygen Cardiovascular status: blood pressure returned to baseline and stable Postop Assessment: no apparent nausea or vomiting Anesthetic complications: no    Last Vitals:  Vitals:   10/05/17 1155 10/05/17 1206  BP: (!) 172/68 (!) 155/64  Pulse: (!) 59 68  Resp: 11 (!) 21  Temp:  36.7 C  SpO2: 96% 95%    Last Pain:  Vitals:   10/05/17 1206  TempSrc: Oral  PainSc:                  Dawn Garcia

## 2017-10-05 NOTE — H&P (Signed)
Dawn Garcia has presented today for surgery, with the diagnosis of atrial fibrillation.  The various methods of treatment have been discussed with the patient and family. After consideration of risks, benefits and other options for treatment, the patient has consented to  Procedure(s): Catheter ablation as a surgical intervention .  Risks include but not limited to bleeding, tamponade, heart block, stroke, damage to surrounding organs, among others. The patient's history has been reviewed, patient examined, no change in status, stable for surgery.  I have reviewed the patient's chart and labs.  Questions were answered to the patient's satisfaction.    Evona Westra Curt Bears, MD 10/05/2017 7:06 AM

## 2017-10-05 NOTE — Anesthesia Procedure Notes (Signed)
Procedure Name: Intubation Date/Time: 10/05/2017 7:45 AM Performed by: Gwyndolyn Saxon, CRNA Pre-anesthesia Checklist: Patient identified, Emergency Drugs available, Suction available, Patient being monitored and Timeout performed Patient Re-evaluated:Patient Re-evaluated prior to induction Oxygen Delivery Method: Circle system utilized Preoxygenation: Pre-oxygenation with 100% oxygen Induction Type: IV induction Ventilation: Mask ventilation without difficulty and Oral airway inserted - appropriate to patient size Laryngoscope Size: Sabra Heck and 2 Grade View: Grade II Tube type: Oral Tube size: 7.0 mm Number of attempts: 1 Airway Equipment and Method: Patient positioned with wedge pillow Placement Confirmation: ETT inserted through vocal cords under direct vision,  positive ETCO2,  CO2 detector and breath sounds checked- equal and bilateral Secured at: 21 cm Tube secured with: Tape Dental Injury: Teeth and Oropharynx as per pre-operative assessment

## 2017-10-05 NOTE — Transfer of Care (Signed)
Immediate Anesthesia Transfer of Care Note  Patient: Dawn Garcia  Procedure(s) Performed: ATRIAL FIBRILLATION ABLATION (N/A )  Patient Location: Cath Lab  Anesthesia Type:General  Level of Consciousness: awake, alert  and oriented  Airway & Oxygen Therapy: Patient Spontanous Breathing and Patient connected to face mask oxygen  Post-op Assessment: Report given to RN and Post -op Vital signs reviewed and stable  Post vital signs: Reviewed and stable  Last Vitals:  Vitals Value Taken Time  BP 175/65 10/05/2017 10:41 AM  Temp 36 C 10/05/2017 10:38 AM  Pulse 65 10/05/2017 10:44 AM  Resp 17 10/05/2017 10:44 AM  SpO2 97 % 10/05/2017 10:44 AM  Vitals shown include unvalidated device data.  Last Pain:  Vitals:   10/05/17 1038  TempSrc: Temporal  PainSc: 0-No pain         Complications: No apparent anesthesia complications

## 2017-10-05 NOTE — Discharge Instructions (Signed)
No driving for 4 days. No lifting over 5 lbs for 1 week. No sexual activity for 1 week. You may return to work in 1 week. Keep procedure site clean & dry. If you notice increased pain, swelling, bleeding or pus, call/return!  You may shower, but no soaking baths/hot tubs/pools for 1 week.   You have an appointment set up with the [facility].  Multiple studies have shown that being followed by a dedicated atrial fibrillation clinic in addition to the standard care you receive from your other physicians improves health. We believe that enrollment in the atrial fibrillation clinic will allow [place] to better care for you.   The phone number to the [facility] is [phone]. The clinic is staffed Monday through Friday from 8:30am to 5pm.  Parking Directions: The clinic is located in the Heart and Vascular Building connected to [facility]. 1)From [address] turn on to [address] and go to the 3rd entrance  (Heart and Vascular entrance) on the right. 2)Look to the right for Heart &Vascular Parking Garage. 3)A code for the entrance is required please call the clinic to receive this.   4)Take the elevators to the 1st floor. Registration is in the room with the glass walls at the end of the hallway.  If you have any trouble parking or locating the clinic, please dont hesitate to call [phone].        Information on my medicine - ELIQUIS (apixaban)  This medication education was reviewed with me or my healthcare representative as part of my discharge preparation.    Why was Eliquis prescribed for you? Eliquis was prescribed for you to reduce the risk of a blood clot forming that can cause a stroke if you have a medical condition called atrial fibrillation (a type of irregular heartbeat).  What do You need to know about Eliquis ? Take your Eliquis TWICE DAILY - one tablet in the morning and one tablet in the evening with or without food. If  you have difficulty swallowing the tablet whole please discuss with your pharmacist how to take the medication safely.  Take Eliquis exactly as prescribed by your doctor and DO NOT stop taking Eliquis without talking to the doctor who prescribed the medication.  Stopping may increase your risk of developing a stroke.  Refill your prescription before you run out.  After discharge, you should have regular check-up appointments with your healthcare provider that is prescribing your Eliquis.  In the future your dose may need to be changed if your kidney function or weight changes by a significant amount or as you get older.  What do you do if you miss a dose? If you miss a dose, take it as soon as you remember on the same day and resume taking twice daily.  Do not take more than one dose of ELIQUIS at the same time to make up a missed dose.  Important Safety Information A possible side effect of Eliquis is bleeding. You should call your healthcare provider right away if you experience any of the following: ? Bleeding from an injury or your nose that does not stop. ? Unusual colored urine (red or dark brown) or unusual colored stools (red or black). ? Unusual bruising for unknown reasons. ? A serious fall or if you hit your head (even if there is no bleeding).  Some medicines may interact with Eliquis and might increase your risk of bleeding or clotting while on Eliquis. To help  avoid this, consult your healthcare provider or pharmacist prior to using any new prescription or non-prescription medications, including herbals, vitamins, non-steroidal anti-inflammatory drugs (NSAIDs) and supplements.  This website has more information on Eliquis (apixaban): http://www.eliquis.com/eliquis/home

## 2017-10-05 NOTE — Discharge Summary (Addendum)
ELECTROPHYSIOLOGY PROCEDURE DISCHARGE SUMMARY    Patient ID: Dawn Garcia,  MRN: [id number], DOB/AGE: 1940/09/02 77 y.o.  Admit date: 10/05/2017 Discharge date: 10/06/2017  Primary Care Physician: Perrin Maltese, MD Primary Cardiologist: Rockey Situ Electrophysiologist: [place]  Primary Discharge Diagnosis:  Paroxysmal atrial fibrillation status post ablation this admission  Secondary Discharge Diagnosis:  1.  SSS s/p pacemaker 2.  OSA on CPAP 3.  CAD 4.  COPD 5.  Diabetes 6.  Hyperlipidemia  Procedures This Admission:  1.  Electrophysiology study and radiofrequency catheter ablation on 10/05/17 by Dr Curt Bears.  This study demonstrated sinus rhythm upon presentation; successful electrical isolation and anatomical encircling of all four pulmonary veins with radiofrequency current; no inducible arrhythmias following ablation both on and off of dobutamine; no early apparent complications.  Brief HPI: Dawn Garcia is a 77 y.o. female with a history of paroxysmal atrial fibrillation. Risks, benefits, and alternatives to catheter ablation of atrial fibrillation were reviewed with the patient who wished to proceed.  The patient underwent cardiac CT prior to the procedure which demonstrated no LAA thrombus.    Hospital Course:  The patient was admitted and underwent EPS/RFCA of atrial fibrillation with details as outlined above.  They were monitored on telemetry overnight which demonstrated AV pacing.  Groin was without complication on the day of discharge.  The patient was examined and considered to be stable for discharge.  Wound care and restrictions were reviewed with the patient.  The patient Dawn Garcia be seen back by Roderic Palau, NP in 4 weeks and Dr Ileana Ladd in 12 weeks for post ablation follow up.   This patients CHA2DS2-VASc Score and unadjusted Ischemic Stroke Rate (% per year) is equal to 4.8 % stroke rate/year from a score of 4 Above score calculated as 1 point each if  present [CHF, HTN, DM, Vascular=MI/PAD/Aortic Plaque, Age if 56-74, or Female] Above score calculated as 2 points each if present [Age > 75, or Stroke/TIA/TE]   Physical Exam: Vitals:   10/06/17 0000 10/06/17 0215 10/06/17 0502 10/06/17 0507  BP: (!) 146/63 (!) 159/76  (!) 150/62  Pulse: 77 79  61  Resp:    17  Temp:    98.3 F (36.8 C)  TempSrc:    Oral  SpO2: 99% 95%  97%  Weight:   215 lb 9.6 oz (97.8 kg)   Height:        GEN- The patient is well appearing, alert and oriented x 3 today.   HEENT: normocephalic, atraumatic; sclera clear, conjunctiva pink; hearing intact; oropharynx clear; neck supple  Lungs- Clear to ausculation bilaterally, normal work of breathing.  No wheezes, rales, rhonchi Heart- Regular rate and rhythm  GI- soft, non-tender, non-distended, bowel sounds present  Extremities- no clubbing, cyanosis, or edema; DP/PT/radial pulses 2+ bilaterally, groin without hematoma/bruit MS- no significant deformity or atrophy Skin- warm and dry, no rash or lesion Psych- euthymic mood, full affect Neuro- strength and sensation are intact   Labs:   Lab Results  Component Value Date   WBC 6.7 09/13/2017   HGB 12.6 09/13/2017   HCT 39.5 09/13/2017   MCV 85 09/13/2017   PLT 194 09/13/2017   No results for input(s): NA, K, CL, CO2, BUN, CREATININE, CALCIUM, PROT, BILITOT, ALKPHOS, ALT, AST, GLUCOSE in the last 168 hours.  Invalid input(s): LABALBU   Discharge Medications:  Allergies as of 10/06/2017      Reactions   Demerol [meperidine] Anaphylaxis   Tolerated Fentanyl 11/10/15   Multaq [dronedarone] Diarrhea  Sulfa Antibiotics Anaphylaxis   Tetracyclines & Related Other (See Comments), Swelling   Made nose, lips,  And tongue itchy Made nose, lips,  And tongue itchy      Medication List    STOP taking these medications   LANTUS SOLOSTAR 100 UNIT/ML Solostar Pen Generic drug:  Insulin Glargine     TAKE these medications   apixaban 5 MG Tabs  tablet Commonly known as:  ELIQUIS Take 1 tablet (5 mg total) 2 (two) times daily by mouth.   carboxymethylcellulose 0.5 % Soln Commonly known as:  REFRESH PLUS Place 1 drop into both eyes 3 (three) times daily as needed.   diphenhydramine-acetaminophen 25-500 MG Tabs tablet Commonly known as:  TYLENOL PM Take 2 tablets by mouth at bedtime.   glimepiride 4 MG tablet Commonly known as:  AMARYL Take 4 mg by mouth 2 (two) times daily.   ICAPS AREDS 2 Caps Take 1 capsule by mouth 2 (two) times daily.   insulin NPH-regular Human (70-30) 100 UNIT/ML injection Commonly known as:  NOVOLIN 70/30 Inject 12 Units into the skin 2 (two) times daily.   levothyroxine 150 MCG tablet Commonly known as:  SYNTHROID, LEVOTHROID Take 1 tablet (150 mcg total) by mouth daily before breakfast.   NITROSTAT 0.4 MG SL tablet Generic drug:  nitroGLYCERIN Place 0.4 mg under the tongue every 5 (five) minutes as needed for chest pain.   pantoprazole 40 MG tablet Commonly known as:  PROTONIX Take 1 tablet (40 mg total) by mouth daily.   potassium chloride 8 MEQ tablet Commonly known as:  KLOR-CON Take 2 tablets (16 meq) in the morning & 1 tablet (8 meq) in the evening   rosuvastatin 40 MG tablet Commonly known as:  CRESTOR TAKE 1 TABLET BY MOUTH ONCE DAILY   torsemide 20 MG tablet Commonly known as:  DEMADEX Take 2 tablets (40 mg total) by mouth daily.   vitamin B-12 500 MCG tablet Commonly known as:  CYANOCOBALAMIN Take 500 mcg by mouth daily.   Vitamin D3 5000 units Tabs Take 1 tablet by mouth every morning.       Disposition:  Discharge Instructions    Diet - low sodium heart healthy   Complete by:  As directed    Increase activity slowly   Complete by:  As directed      Follow-up Information    [facility] Follow up on 11/02/2017.   Specialty:  Cardiology Why:  at [facility] information: [address] [id number] [place] [address] [phone]       Constance Haw, MD Follow up on 01/09/2018.   Specialty:  Cardiology Why:  at 10:30AM Contact information: [place] [address] [phone]           Duration of Discharge Encounter: Greater than 30 minutes including physician time.  Signed, Chanetta Marshall, NP 10/06/2017 7:17 AM   I have seen and examined this patient with Chanetta Marshall.  Agree with above, note added to reflect my findings.  On exam, RRR, no murmurs, lungs clear.  Patient had AF ablation for paroxysmal atrial fibrillation. Tolerated the procedure well without complaint this morning. Plan for discharge today with follow up in EP clinic.  Antigone Crowell M. Yazlynn Birkeland MD 10/06/2017 11:04 AM

## 2017-10-05 NOTE — Progress Notes (Signed)
Site area: 2 left fv sheaths Site Prior to Removal:  Level 0 Pressure Applied For: 20 minutes Manual:   yes Patient Status During Pull:  stable Post Pull Site:  Level 0 Post Pull Instructions Given:  yes Post Pull Pulses Present: left dp palpable Dressing Applied:  Gauze and tegaderm Bedrest begins @ 1140 Comments: IV saline locked

## 2017-10-06 DIAGNOSIS — E785 Hyperlipidemia, unspecified: Secondary | ICD-10-CM | POA: Diagnosis not present

## 2017-10-06 DIAGNOSIS — Z885 Allergy status to narcotic agent status: Secondary | ICD-10-CM | POA: Diagnosis not present

## 2017-10-06 DIAGNOSIS — J449 Chronic obstructive pulmonary disease, unspecified: Secondary | ICD-10-CM | POA: Diagnosis not present

## 2017-10-06 DIAGNOSIS — I219 Acute myocardial infarction, unspecified: Secondary | ICD-10-CM | POA: Diagnosis not present

## 2017-10-06 DIAGNOSIS — G4733 Obstructive sleep apnea (adult) (pediatric): Secondary | ICD-10-CM | POA: Diagnosis not present

## 2017-10-06 DIAGNOSIS — I509 Heart failure, unspecified: Secondary | ICD-10-CM | POA: Diagnosis not present

## 2017-10-06 DIAGNOSIS — I495 Sick sinus syndrome: Secondary | ICD-10-CM | POA: Diagnosis not present

## 2017-10-06 DIAGNOSIS — E119 Type 2 diabetes mellitus without complications: Secondary | ICD-10-CM | POA: Diagnosis not present

## 2017-10-06 DIAGNOSIS — I480 Paroxysmal atrial fibrillation: Secondary | ICD-10-CM

## 2017-10-06 DIAGNOSIS — M1990 Unspecified osteoarthritis, unspecified site: Secondary | ICD-10-CM | POA: Diagnosis not present

## 2017-10-06 DIAGNOSIS — I2510 Atherosclerotic heart disease of native coronary artery without angina pectoris: Secondary | ICD-10-CM | POA: Diagnosis not present

## 2017-10-06 DIAGNOSIS — Z882 Allergy status to sulfonamides status: Secondary | ICD-10-CM | POA: Diagnosis not present

## 2017-10-06 LAB — POCT ACTIVATED CLOTTING TIME: ACTIVATED CLOTTING TIME: 335 s

## 2017-10-06 LAB — GLUCOSE, CAPILLARY
Glucose-Capillary: 165 mg/dL — ABNORMAL HIGH (ref 70–99)
Glucose-Capillary: 124 mg/dL — ABNORMAL HIGH (ref 70–99)

## 2017-10-06 MED FILL — Dobutamine in Dextrose 5% Inj 4 MG/ML: INTRAVENOUS | Qty: 250 | Status: AC

## 2017-10-06 NOTE — Progress Notes (Signed)
Patient received discharge information and acknowledged understanding of it. Patient received femoral site care education. Patient IV was removed.

## 2017-10-11 ENCOUNTER — Other Ambulatory Visit: Payer: Self-pay

## 2017-10-16 ENCOUNTER — Telehealth: Payer: Self-pay | Admitting: Internal Medicine

## 2017-10-16 NOTE — Telephone Encounter (Signed)
Patient would like to discuss her ECG from the 25th and 26th with nurse Please call to discuss

## 2017-10-17 NOTE — Telephone Encounter (Signed)
Left a message to call back.

## 2017-10-17 NOTE — Telephone Encounter (Signed)
Patient returned the phone call. She stated that she had an ablation on 7/25. She had some questions about her EKG readings from 7/25 and 7/26 that she wanted Dr.Klein to review. She is concerned that the EKG stated that she had an MI and that ventricular pacing was no longer present. She has been informed that Dr. Caryl Comes is not in this week and she stated that she could wait until his return. She would like for Dr. Caryl Comes to review.

## 2017-10-19 ENCOUNTER — Other Ambulatory Visit: Payer: Self-pay | Admitting: Internal Medicine

## 2017-10-22 NOTE — Telephone Encounter (Signed)
The tracings are in fact better  V pacing is a programming change which is reasonable  MI was a misinterpration by the computer

## 2017-10-25 DIAGNOSIS — E890 Postprocedural hypothyroidism: Secondary | ICD-10-CM | POA: Diagnosis not present

## 2017-10-25 DIAGNOSIS — E1165 Type 2 diabetes mellitus with hyperglycemia: Secondary | ICD-10-CM | POA: Diagnosis not present

## 2017-10-25 NOTE — Telephone Encounter (Signed)
Patient made aware and verbalized her understanding. She would like to know when she should follow up with Dr. Caryl Comes. She stated that she would like to remain with him. Message routed to the nurse.

## 2017-10-26 NOTE — Telephone Encounter (Signed)
Patient s/p a-fib ablation on 10/05/17 with Dr. Curt Bears. She is scheduled to follow up with Roderic Palau, NP on 11/02/17 & Dr. Curt Bears on 01/09/18.   To Dr. Caryl Comes to review- when would you want to see her back?  She hasn't seen Dr. Rockey Situ since 03/2016.

## 2017-10-29 NOTE — Telephone Encounter (Signed)
Maybe after she sees WC

## 2017-10-30 DIAGNOSIS — H40003 Preglaucoma, unspecified, bilateral: Secondary | ICD-10-CM | POA: Diagnosis not present

## 2017-10-31 NOTE — Telephone Encounter (Signed)
I left a message on the patient's unidentified voice mail (ok per DPR). I advised her that we can have her follow up in December with Dr. Caryl Comes. I asked that she call back to schedule.

## 2017-11-01 DIAGNOSIS — E785 Hyperlipidemia, unspecified: Secondary | ICD-10-CM | POA: Diagnosis not present

## 2017-11-01 DIAGNOSIS — E890 Postprocedural hypothyroidism: Secondary | ICD-10-CM | POA: Diagnosis not present

## 2017-11-01 DIAGNOSIS — E1169 Type 2 diabetes mellitus with other specified complication: Secondary | ICD-10-CM | POA: Diagnosis not present

## 2017-11-01 DIAGNOSIS — N183 Chronic kidney disease, stage 3 (moderate): Secondary | ICD-10-CM | POA: Diagnosis not present

## 2017-11-01 DIAGNOSIS — E1165 Type 2 diabetes mellitus with hyperglycemia: Secondary | ICD-10-CM | POA: Diagnosis not present

## 2017-11-02 ENCOUNTER — Ambulatory Visit (HOSPITAL_COMMUNITY)
Admission: RE | Admit: 2017-11-02 | Discharge: 2017-11-02 | Disposition: A | Discharge status: Home / Self Care | Payer: Medicare Other | Source: Ambulatory Visit | Attending: Nurse Practitioner | Admitting: Nurse Practitioner

## 2017-11-02 ENCOUNTER — Encounter (HOSPITAL_COMMUNITY): Payer: Self-pay | Admitting: Nurse Practitioner

## 2017-11-02 VITALS — BP 124/80 | HR 66 | Ht 63.0 in | Wt 217.0 lb

## 2017-11-02 DIAGNOSIS — Z794 Long term (current) use of insulin: Secondary | ICD-10-CM | POA: Diagnosis not present

## 2017-11-02 DIAGNOSIS — I509 Heart failure, unspecified: Secondary | ICD-10-CM | POA: Diagnosis not present

## 2017-11-02 DIAGNOSIS — Z79890 Hormone replacement therapy (postmenopausal): Secondary | ICD-10-CM | POA: Diagnosis not present

## 2017-11-02 DIAGNOSIS — Z950 Presence of cardiac pacemaker: Secondary | ICD-10-CM | POA: Insufficient documentation

## 2017-11-02 DIAGNOSIS — K219 Gastro-esophageal reflux disease without esophagitis: Secondary | ICD-10-CM | POA: Diagnosis not present

## 2017-11-02 DIAGNOSIS — E039 Hypothyroidism, unspecified: Secondary | ICD-10-CM | POA: Diagnosis not present

## 2017-11-02 DIAGNOSIS — Z885 Allergy status to narcotic agent status: Secondary | ICD-10-CM | POA: Insufficient documentation

## 2017-11-02 DIAGNOSIS — E785 Hyperlipidemia, unspecified: Secondary | ICD-10-CM | POA: Insufficient documentation

## 2017-11-02 DIAGNOSIS — Z79899 Other long term (current) drug therapy: Secondary | ICD-10-CM | POA: Diagnosis not present

## 2017-11-02 DIAGNOSIS — E538 Deficiency of other specified B group vitamins: Secondary | ICD-10-CM | POA: Diagnosis not present

## 2017-11-02 DIAGNOSIS — E559 Vitamin D deficiency, unspecified: Secondary | ICD-10-CM | POA: Insufficient documentation

## 2017-11-02 DIAGNOSIS — I4891 Unspecified atrial fibrillation: Secondary | ICD-10-CM | POA: Insufficient documentation

## 2017-11-02 DIAGNOSIS — H353110 Nonexudative age-related macular degeneration, right eye, stage unspecified: Secondary | ICD-10-CM | POA: Insufficient documentation

## 2017-11-02 DIAGNOSIS — M1990 Unspecified osteoarthritis, unspecified site: Secondary | ICD-10-CM | POA: Diagnosis not present

## 2017-11-02 DIAGNOSIS — I252 Old myocardial infarction: Secondary | ICD-10-CM | POA: Insufficient documentation

## 2017-11-02 DIAGNOSIS — J449 Chronic obstructive pulmonary disease, unspecified: Secondary | ICD-10-CM | POA: Diagnosis not present

## 2017-11-02 DIAGNOSIS — E1122 Type 2 diabetes mellitus with diabetic chronic kidney disease: Secondary | ICD-10-CM | POA: Diagnosis not present

## 2017-11-02 DIAGNOSIS — Z882 Allergy status to sulfonamides status: Secondary | ICD-10-CM | POA: Insufficient documentation

## 2017-11-02 DIAGNOSIS — Z85820 Personal history of malignant melanoma of skin: Secondary | ICD-10-CM | POA: Insufficient documentation

## 2017-11-02 DIAGNOSIS — Z9889 Other specified postprocedural states: Secondary | ICD-10-CM | POA: Diagnosis not present

## 2017-11-02 DIAGNOSIS — Z96611 Presence of right artificial shoulder joint: Secondary | ICD-10-CM | POA: Insufficient documentation

## 2017-11-02 DIAGNOSIS — I130 Hypertensive heart and chronic kidney disease with heart failure and stage 1 through stage 4 chronic kidney disease, or unspecified chronic kidney disease: Secondary | ICD-10-CM | POA: Diagnosis not present

## 2017-11-02 DIAGNOSIS — N189 Chronic kidney disease, unspecified: Secondary | ICD-10-CM | POA: Insufficient documentation

## 2017-11-02 DIAGNOSIS — Z888 Allergy status to other drugs, medicaments and biological substances status: Secondary | ICD-10-CM | POA: Insufficient documentation

## 2017-11-02 DIAGNOSIS — Z7901 Long term (current) use of anticoagulants: Secondary | ICD-10-CM | POA: Insufficient documentation

## 2017-11-02 DIAGNOSIS — G4733 Obstructive sleep apnea (adult) (pediatric): Secondary | ICD-10-CM | POA: Diagnosis not present

## 2017-11-02 DIAGNOSIS — I495 Sick sinus syndrome: Secondary | ICD-10-CM | POA: Diagnosis not present

## 2017-11-02 DIAGNOSIS — I480 Paroxysmal atrial fibrillation: Secondary | ICD-10-CM

## 2017-11-02 DIAGNOSIS — Z8249 Family history of ischemic heart disease and other diseases of the circulatory system: Secondary | ICD-10-CM | POA: Insufficient documentation

## 2017-11-02 DIAGNOSIS — Z90710 Acquired absence of both cervix and uterus: Secondary | ICD-10-CM | POA: Insufficient documentation

## 2017-11-02 DIAGNOSIS — Z9049 Acquired absence of other specified parts of digestive tract: Secondary | ICD-10-CM | POA: Insufficient documentation

## 2017-11-02 DIAGNOSIS — Z87891 Personal history of nicotine dependence: Secondary | ICD-10-CM | POA: Insufficient documentation

## 2017-11-02 DIAGNOSIS — Z803 Family history of malignant neoplasm of breast: Secondary | ICD-10-CM | POA: Insufficient documentation

## 2017-11-02 DIAGNOSIS — I2510 Atherosclerotic heart disease of native coronary artery without angina pectoris: Secondary | ICD-10-CM | POA: Insufficient documentation

## 2017-11-02 DIAGNOSIS — Z955 Presence of coronary angioplasty implant and graft: Secondary | ICD-10-CM | POA: Insufficient documentation

## 2017-11-02 DIAGNOSIS — Z881 Allergy status to other antibiotic agents status: Secondary | ICD-10-CM | POA: Insufficient documentation

## 2017-11-02 DIAGNOSIS — Z8349 Family history of other endocrine, nutritional and metabolic diseases: Secondary | ICD-10-CM | POA: Insufficient documentation

## 2017-11-02 NOTE — Progress Notes (Signed)
Primary Care Physician: Perrin Maltese, MD Referring Physician: Dr. Chelsea Primus Nevel is a 77 y.o. female with a h/o afib s/p ablation x one month. She reports that she is doing well. She noticed some afib right after surgery but has not noted anything recently. No swallowing or groin issues.  Today, she denies symptoms of palpitations, chest pain, shortness of breath, orthopnea, PND, lower extremity edema, dizziness, presyncope, syncope, or neurologic sequela. The patient is tolerating medications without difficulties and is otherwise without complaint today.   Past Medical History:  Diagnosis Date  . Age-related macular degeneration, dry, right eye   . Age-related macular degeneration, wet, left eye ([place])   . Arthritis    "all over; particularly in hands/fingers; all my joints" (10/05/2017)  . Atrial fibrillation ([place])   . CAD (coronary artery disease)   . CHF (congestive heart failure) (H[facility])     A-Fib  . Chronic kidney disease   . COPD (chronic obstructive pulmonary disease) ([place])    "very mild" (10/05/2017)  . Family history of adverse reaction to anesthesia    "daughter w/PONV & woke up during endoscopy" (10/05/2017)  . GERD (gastroesophageal reflux disease)   . Graves' disease    S/P "radioactive tx"  . History of blood transfusion    "related to shoulder replacement"   . History of gout    "no longer on daily RX" (10/05/2017)  . History of hiatal hernia   . Hyperlipidemia   . Hypertension   . Hypocalcemia   . Hypokalemia   . Hypothyroidism   . Macular degeneration   . Melanoma ([place]) ~ 2012   "off my back" (10/05/2017)  . Myocardial infarction [facility])    "was told I'd had one; don't really know when" (10/05/2017)  . OSA on CPAP   . 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 Sinus node dysfunction (H[facility])   . Type II diabetes mellitus ([place])   . Vitamin B12 deficiency   . Vitamin D deficiency     Past Surgical History:  Procedure Laterality Date  . APPENDECTOMY    . ATRIAL FIBRILLATION ABLATION N/A 10/05/2017   Procedure: ATRIAL FIBRILLATION ABLATION;  Surgeon: Constance Haw, MD;  Location: [facility];  Service: Cardiovascular;  Laterality: N/A;  . ATRIAL FIBRILLATION ABLATION  ~ 2014  . CARDIAC CATHETERIZATION  04/2017  . COLONOSCOPY W/ BIOPSIES AND POLYPECTOMY  2015   1 polyp removed- repeat 3 years  . CORONARY ANGIOPLASTY WITH STENT PLACEMENT  ~ 2014  . EP IMPLANTABLE DEVICE N/A 11/10/2015   Procedure: Pacemaker Implant;  Surgeon: Will [name], MD;  Location: [facility];  Service: Cardiovascular;  Laterality: N/A;  . EP IMPLANTABLE DEVICE N/A 11/11/2015   Procedure: Lead Revision/Repair;  Surgeon: Evans Lance, MD;  Location: [facility];  Service: Cardiovascular;  Laterality: N/A;  . INGUINAL HERNIA REPAIR Left 1951  . JOINT REPLACEMENT    . LAPAROSCOPIC CHOLECYSTECTOMY    . MELANOMA EXCISION  ~ 2012   "off my back" (10/05/2017)  . PARATHYROIDECTOMY     "removed 3 glands; still have 1 gland" (10/05/2017)  . RIGHT/LEFT HEART CATH AND CORONARY ANGIOGRAPHY N/A 05/03/2017   Procedure: RIGHT/LEFT HEART CATH AND CORONARY ANGIOGRAPHY;  Surgeon: Leonie Man, MD;  Location: [facility];  Service: Cardiovascular;  Laterality: N/A;  . TONSILLECTOMY    . TOTAL SHOULDER ARTHROPLASTY Right  x 2  . VAGINAL HYSTERECTOMY     "total"    Current Outpatient Medications  Medication Sig Dispense Refill  . apixaban (ELIQUIS) 5 MG TABS tablet Take 1 tablet (5 mg total) 2 (two) times daily by mouth. 180 tablet 3  . carboxymethylcellulose (REFRESH PLUS) 0.5 % SOLN Place 1 drop into both eyes 3 (three) times daily as needed.    . Cholecalciferol (VITAMIN D3) 5000 units TABS Take 1 tablet by mouth every morning.    Marland Kitchen glimepiride (AMARYL) 4 MG tablet Take 4 mg by mouth 2 (two) times daily.    . insulin NPH-regular Human (NOVOLIN 70/30) (70-30) 100  UNIT/ML injection Inject 12 Units into the skin 2 (two) times daily.    Marland Kitchen levothyroxine (SYNTHROID, LEVOTHROID) 150 MCG tablet Take 1 tablet (150 mcg total) by mouth daily before breakfast.    . Multiple Vitamins-Minerals (ICAPS AREDS 2) CAPS Take 1 capsule by mouth 2 (two) times daily.    . nitroGLYCERIN (NITROSTAT) 0.4 MG SL tablet Place 0.4 mg under the tongue every 5 (five) minutes as needed for chest pain.    . pantoprazole (PROTONIX) 40 MG tablet Take 1 tablet (40 mg total) by mouth daily. 90 tablet 3  . potassium chloride (KLOR-CON) 8 MEQ tablet Take 2 tablets (16 meq) in the morning & 1 tablet (8 meq) in the evening 270 tablet 3  . rosuvastatin (CRESTOR) 40 MG tablet TAKE 1 TABLET BY MOUTH ONCE DAILY 90 tablet 3  . torsemide (DEMADEX) 20 MG tablet Take 2 tablets (40 mg total) by mouth daily. 60 tablet 0  . vitamin B-12 (CYANOCOBALAMIN) 500 MCG tablet Take 500 mcg by mouth daily.    . diphenhydramine-acetaminophen (TYLENOL PM) 25-500 MG TABS tablet Take 2 tablets by mouth at bedtime.      No current facility-administered medications for this encounter.     Allergies  Allergen Reactions  . Demerol [Meperidine] Anaphylaxis    Tolerated Fentanyl 11/10/15  . Multaq [Dronedarone] Diarrhea  . Sulfa Antibiotics Anaphylaxis  . Tetracyclines & Related Other (See Comments) and Swelling    Made nose, lips,  And tongue itchy Made nose, lips,  And tongue itchy    Social History   Socioeconomic History  . Marital status: Married    Spouse name: Not on file  . Number of children: Not on file  . Years of education: Not on file  . Highest education level: Not on file  Occupational History  . Occupation: Retired  Scientific laboratory technician  . Financial resource strain: Not on file  . Food insecurity:    Worry: Not on file    Inability: Not on file  . Transportation needs:    Medical: Not on file    Non-medical: Not on file  Tobacco Use  . Smoking status: Former Smoker    Packs/day: 2.00    Years:  3.00    Pack years: 6.00    Types: Cigarettes  . Smokeless tobacco: Never Used  . Tobacco comment: 10/05/2017 "smoked in my 20's"  Substance and Sexual Activity  . Alcohol use: Yes    Comment: 10/05/2017 "1 mixed drink q 2-3 months"  . Drug use: Never  . Sexual activity: Not on file  Lifestyle  . Physical activity:    Days per week: Not on file    Minutes per session: Not on file  . Stress: Not on file  Relationships  . Social connections:    Talks on phone: Not on file  Gets together: Not on file    Attends religious service: Not on file    Active member of club or organization: Not on file    Attends meetings of clubs or organizations: Not on file    Relationship status: Not on file  . Intimate partner violence:    Fear of current or ex partner: Not on file    Emotionally abused: Not on file    Physically abused: Not on file    Forced sexual activity: Not on file  Other Topics Concern  . Not on file  Social History Narrative   Lives in [place] w/ husband.    Family History  Problem Relation Age of Onset  . Thyroid disease Mother 24       3 days after surgery  . Heart defect Father 23       ?ascending aortic aneurysm  . Breast cancer Sister 21       half sister    ROS- All systems are reviewed and negative except as per the HPI above  Physical Exam: Vitals:   11/02/17 1347  BP: 124/80  Pulse: 66  Weight: 98.4 kg  Height: 5\' 3"  (1.6 m)   Wt Readings from Last 3 Encounters:  11/02/17 98.4 kg  10/06/17 97.8 kg  09/13/17 97.7 kg    Labs: Lab Results  Component Value Date   NA 140 09/13/2017   K 3.9 09/13/2017   CL 102 09/13/2017   CO2 23 09/13/2017   GLUCOSE 170 (H) 09/13/2017   BUN 22 09/13/2017   CREATININE 1.36 (H) 09/13/2017   CALCIUM 9.1 09/13/2017   PHOS 4.1 05/03/2017   MG 2.4 05/16/2017   Lab Results  Component Value Date   INR 1.37 05/02/2017   Lab Results  Component Value Date   CHOL 127 04/19/2016   HDL 42 04/19/2016    LDLCALC 43 04/19/2016   TRIG 209 (H) 04/19/2016     GEN- The patient is well appearing, alert and oriented x 3 today.   Head- normocephalic, atraumatic Eyes-  Sclera clear, conjunctiva pink Ears- hearing intact Oropharynx- clear Neck- supple, no JVP Lymph- no cervical lymphadenopathy Lungs- Clear to ausculation bilaterally, normal work of breathing Heart- Regular rate and rhythm, no murmurs, rubs or gallops, PMI not laterally displaced GI- soft, NT, ND, + BS Extremities- no clubbing, cyanosis, or edema MS- no significant deformity or atrophy Skin- no rash or lesion Psych- euthymic mood, full affect Neuro- strength and sensation are intact  EKG-SR with first degree AV block, qtc 523 ms(present on previous EKG's)    Assessment and Plan: 1. afib S/p  ablation  Maintaining  SR Feels well No changes  Continue anticoagulin without interruption  F/u with Dr. Curt Bears in October as scheduled  Geroge Baseman. Jusitn Salsgiver, [facility] [address] [place], [place] [address] [phone]

## 2017-11-06 ENCOUNTER — Telehealth (HOSPITAL_COMMUNITY): Payer: Self-pay | Admitting: *Deleted

## 2017-11-06 NOTE — Telephone Encounter (Signed)
Patient called in stating over the last week she has started having chest pain with exertion. Shes not sure if she is going in and out of Afib or not b/c she cannot tell. She does feel some palpitations in her neck at times. I have asked her to send a remote transmission in so we can see what her rhythm is over the last week and then we can better formulate plan of next steps for her symptoms. Pt states she will send transmission this evening when she returns home.

## 2017-11-07 NOTE — Telephone Encounter (Signed)
Remote transmission was not received. Called patient to see if she attempted to send the transmission. Patient states that she attempted to send it last night. Verbal instructions provided. Patient states that she will try to send the transmission again around 1200 today.

## 2017-11-07 NOTE — Telephone Encounter (Signed)
Remote transmission reviewed. Presenting rhythm: ApVp @ 96bpm (SIR). 8.1% AT/AF burden, max dur. 3hrs 29mins (10-28-17) - brief AT/AFL competitive pacing d/t sensor per recent EGMs. No ventricular high rate episodes. Stable lead measurements. Normal device function.

## 2017-11-07 NOTE — Telephone Encounter (Signed)
Patient returned my call. Informed patient that I wanted to schedule her an appointment with the device clinic to see if her sensor was causing her sx's. Patient verbalized understanding. Appt scheduled for 11/08/17 @ 1630 - patient aware that the appt is at the [facility].

## 2017-11-07 NOTE — Telephone Encounter (Signed)
LVM/sss  Called to inform patient of remote results and to schedule appt with DC to ?adjust sensor.

## 2017-11-08 ENCOUNTER — Ambulatory Visit (INDEPENDENT_AMBULATORY_CARE_PROVIDER_SITE_OTHER): Payer: Medicare Other | Admitting: *Deleted

## 2017-11-08 DIAGNOSIS — I471 Supraventricular tachycardia: Secondary | ICD-10-CM

## 2017-11-08 LAB — CUP PACEART INCLINIC DEVICE CHECK
Brady Statistic RA Percent Paced: 51 %
Brady Statistic RV Percent Paced: 46 %
Date Time Interrogation Session: 20190828202756
Implantable Lead Implant Date: 20170829
Implantable Lead Location: 753859
Lead Channel Impedance Value: 600 Ohm
Lead Channel Pacing Threshold Amplitude: 1 V
Lead Channel Pacing Threshold Pulse Width: 0.4 ms
Lead Channel Sensing Intrinsic Amplitude: 12 mV
Lead Channel Sensing Intrinsic Amplitude: 4.4 mV
Lead Channel Setting Pacing Amplitude: 2 V
Lead Channel Setting Pacing Pulse Width: 0.4 ms
Lead Channel Setting Sensing Sensitivity: 2 mV
MDC IDC LEAD IMPLANT DT: 20170829
MDC IDC LEAD LOCATION: 753860
MDC IDC MSMT BATTERY REMAINING LONGEVITY: 116 mo
MDC IDC MSMT BATTERY VOLTAGE: 3.01 V
MDC IDC MSMT LEADCHNL RA IMPEDANCE VALUE: 437.5 Ohm
MDC IDC MSMT LEADCHNL RA PACING THRESHOLD PULSEWIDTH: 0.4 ms
MDC IDC MSMT LEADCHNL RV PACING THRESHOLD AMPLITUDE: 0.875 V
MDC IDC PG IMPLANT DT: 20170829
MDC IDC SET LEADCHNL RV PACING AMPLITUDE: 1.125
Pulse Gen Model: 2272
Pulse Gen Serial Number: 7945290

## 2017-11-08 MED ORDER — DILTIAZEM HCL ER COATED BEADS 120 MG PO CP24: 120.0000 mg | ORAL_CAPSULE | Freq: Every day | ORAL | 3 refills | Status: DC

## 2017-11-08 NOTE — Progress Notes (Signed)
Pacemaker check in clinic. Normal device function. Thresholds, sensing, impedances consistent with previous measurements. Device programmed to maximize longevity. 5,058 mode switches (7.8%)-- EMGs appear AT, competitive pacing, and some 2:1 block. Dr. Curt Bears ordered Diltiazem 120 mg daily. Industry rep reprogrammed device  per Dr. Curt Bears-- AMS base rate to 60 bpm from 70 bpm, atrial tachycardia detection rate 130 bpm from 150 bpm, max sensor rate 110 bpm from 120 bpm, max track rate 110 bpm from 120bpm, sensor to passive from ON, Slope to 8 from 10, threshold to 2.0 from auto (-0.5), VIP Extension to 170 ms from 150 ms. No high ventricular rates noted. Histogram distribution appropriate for patient activity level. Device programmed to optimize intrinsic conduction. Estimated longevity 9.7-10.5 years. Patient education completed. ROV with WC 01/09/18, [place] 12/19/17

## 2017-11-09 DIAGNOSIS — H35433 Paving stone degeneration of retina, bilateral: Secondary | ICD-10-CM | POA: Diagnosis not present

## 2017-11-09 DIAGNOSIS — H43813 Vitreous degeneration, bilateral: Secondary | ICD-10-CM | POA: Diagnosis not present

## 2017-11-09 DIAGNOSIS — H353221 Exudative age-related macular degeneration, left eye, with active choroidal neovascularization: Secondary | ICD-10-CM | POA: Diagnosis not present

## 2017-11-09 DIAGNOSIS — H353112 Nonexudative age-related macular degeneration, right eye, intermediate dry stage: Secondary | ICD-10-CM | POA: Diagnosis not present

## 2017-11-17 DIAGNOSIS — I10 Essential (primary) hypertension: Secondary | ICD-10-CM | POA: Diagnosis not present

## 2017-11-17 DIAGNOSIS — E039 Hypothyroidism, unspecified: Secondary | ICD-10-CM | POA: Diagnosis not present

## 2017-11-17 DIAGNOSIS — Z23 Encounter for immunization: Secondary | ICD-10-CM | POA: Diagnosis not present

## 2017-11-17 DIAGNOSIS — E1129 Type 2 diabetes mellitus with other diabetic kidney complication: Secondary | ICD-10-CM | POA: Diagnosis not present

## 2017-11-17 DIAGNOSIS — E782 Mixed hyperlipidemia: Secondary | ICD-10-CM | POA: Diagnosis not present

## 2017-12-01 ENCOUNTER — Telehealth: Payer: Self-pay | Admitting: Internal Medicine

## 2017-12-01 DIAGNOSIS — R1310 Dysphagia, unspecified: Secondary | ICD-10-CM | POA: Diagnosis not present

## 2017-12-01 DIAGNOSIS — Z86010 Personal history of colonic polyps: Secondary | ICD-10-CM | POA: Diagnosis not present

## 2017-12-01 DIAGNOSIS — Z800 Family history of malignant neoplasm of digestive organs: Secondary | ICD-10-CM | POA: Diagnosis not present

## 2017-12-01 NOTE — Telephone Encounter (Signed)
° °  [facility] Pre-operative Risk Assessment    Request for surgical clearance:  1. What type of surgery is being performed? Colon egd    2. When is this surgery scheduled? 02/27/18  3. What type of clearance is required (medical clearance vs. Pharmacy clearance to hold med vs. Both)? both   4. Are there any medications that need to be held prior to surgery and how long?  Eliquis and plavix   5. Practice name and name of physician performing surgery?  [facility]   6. What is your office phone number [phone]   7.   What is your office fax number [phone]   8.   Anesthesia type (None, local, MAC, general) ? Monitored    Clarisse Gouge 12/01/2017, 4:05 PM  _________________________________________________________________   (provider comments below)

## 2017-12-01 NOTE — Telephone Encounter (Signed)
Patient is s/p a-fib ablation on 10/05/17 with Dr. Curt Bears- on eliquis 5 mg BID  The patient has a history of coronary stenting 03/2008 per Dr. Donivan Scull last office note dated 10/03/16. I do not see plavix on her medication list.   To Dr. Caryl Comes to review for clearance.

## 2017-12-05 ENCOUNTER — Ambulatory Visit (INDEPENDENT_AMBULATORY_CARE_PROVIDER_SITE_OTHER): Payer: Medicare Other | Admitting: *Deleted

## 2017-12-05 ENCOUNTER — Telehealth: Payer: Self-pay | Admitting: Cardiology

## 2017-12-05 VITALS — BP 130/78 | Ht 63.0 in | Wt 214.2 lb

## 2017-12-05 DIAGNOSIS — R0602 Shortness of breath: Secondary | ICD-10-CM

## 2017-12-05 DIAGNOSIS — R079 Chest pain, unspecified: Secondary | ICD-10-CM

## 2017-12-05 NOTE — Patient Instructions (Signed)
Medication Instructions: - Your physician recommends that you continue on your current medications as directed. Please refer to the Current Medication list given to you today.  Labwork: - none ordered  Procedures/Testing: - none ordered  Follow-Up: - Thursday 12/07/17 at 8:00 am with Dr. Caryl Comes   Any Additional Special Instructions Will Be Listed Below (If Applicable).     If you need a refill on your cardiac medications before your next appointment, please call your pharmacy.

## 2017-12-05 NOTE — Telephone Encounter (Signed)
Pt c/o Shortness Of Breath: STAT if SOB developed within the last 24 hours or pt is noticeably SOB on the phone  1. Are you currently SOB (can you hear that pt is SOB on the phone)? Not at the moment-but uncomfortable- really bad earlier this morningi 2. How long have you been experiencing SOB? This have been going on for a while   3. Are you SOB when sitting or when up moving around? When moving around  4. Are you currently experiencing any other symptoms? Chest discomfort, throat discomfort

## 2017-12-05 NOTE — Telephone Encounter (Signed)
Reviewed with Dr. Curt Bears who recommended patient come for EKG. Since patient lives in [place] I contacted [facility] triage nurse.  Pt sees Dr. Caryl Comes also so she will discuss with him if patient can come for EKG/to be seen if needed. Triage nurse will follow up with patient.

## 2017-12-05 NOTE — Telephone Encounter (Signed)
Left a message for the patient to call back concerning the chest pain.

## 2017-12-05 NOTE — Progress Notes (Addendum)
1.) Reason for visit: EKG  2.) Name of MD requesting visit: Klein/ Camnitz  3.) H&P: The patient presents to the office today for increased episodes of SOB with minimal exertion. She called the [facility] office this morning and triage discussed with Dr. Curt Bears. She was instructed to come in for an EKG in the office. Prior to coming in, the patient was called by Dr. Caryl Comes to evaluate her symptoms. Dr. Caryl Comes did not feel that her symptoms were ischemic in nature, therefore she was instructed to come in for a nurse visit EKG. She has previously had SOB that has been more intermittent with walking long distances. She did have an episode of throat discomfort this morning. She states she has had this before, but it typically resolves quickly. The throat discomfort this morning was the first she has felt in "a while." She did have some chest discomfort as well. The patient is s/p A-fib ablation on 10/05/17 with Dr. Curt Bears. She was in the office on 11/08/17 for a device check. She states her device was reprogrammed at that time. She feels her SOB may be a little worse since that time.   4.) ROS related to problem: The patient states that she is not experiencing any discomfort in her throat at this time. She did feel more SOB walking down the hall way to the exam room. I checked the patient's 02 sat at rest- 96% and with ambulation 02 sat- at it's lowest was 95% - max HR 81 bpm. The patient did complain of some slight dizziness as of late that is intermittent. Formal orthostatics were not obtained, but there was not change from her lying to sitting BP post EKG.   5.) Assessment and plan per MD: Per Dr. Caryl Comes, forward to Dr. Curt Bears for further review and recommendations. Per Dr. Caryl Comes, he will make Dr. Curt Bears aware. Forwarding to MD for further review.    Reviewed the patient nurse visit with Dr. Curt Bears Dr. Caryl Comes- per Dr. Caryl Comes he will add the patient on to the [facility] schedule for possible device  reprogramming on Thursday 12/07/17 at 8:00 am.

## 2017-12-05 NOTE — Telephone Encounter (Signed)
Tried to call pt >>VM  SOB with activity post ablation Known CAD  Chest discomfort and throat comfort antedates the cath 2019 and is not new Recently device reprogrammed and dilt ordered.  Will arrange for ECG and will fax to [facility]

## 2017-12-05 NOTE — Telephone Encounter (Signed)
Pt states ablation 2 months ago Adjustment on pacer 2 weeks ago SOB, chest discomfort worse this am with shower. Sent transmission. Device clinic is unable to access the transmission due to "remote has timed out" Pt made aware of this and is advised that with chest and throat discomfort and SOB that she should be seen in ER. She is not going to ER because she has discussed this with Dr. Curt Bears in the past and "he agrees that this is a waste of time." Advised Dr. Curt Bears and his nurse area in clinic today and I would forward the message to them for further recommendations.  Pt is appreciative and in agreement with this plan.

## 2017-12-05 NOTE — Telephone Encounter (Signed)
Dr. Caryl Comes called the patient- see 12/01/17 phone encounter. She came in for an EKG to be done today.

## 2017-12-07 ENCOUNTER — Encounter: Payer: Self-pay | Admitting: Internal Medicine

## 2017-12-07 ENCOUNTER — Ambulatory Visit (INDEPENDENT_AMBULATORY_CARE_PROVIDER_SITE_OTHER): Payer: Medicare Other | Admitting: Internal Medicine

## 2017-12-07 VITALS — BP 140/80 | HR 60 | Ht 63.0 in | Wt 216.8 lb

## 2017-12-07 DIAGNOSIS — Z950 Presence of cardiac pacemaker: Secondary | ICD-10-CM

## 2017-12-07 DIAGNOSIS — I481 Persistent atrial fibrillation: Secondary | ICD-10-CM

## 2017-12-07 DIAGNOSIS — I4819 Other persistent atrial fibrillation: Secondary | ICD-10-CM

## 2017-12-07 DIAGNOSIS — I495 Sick sinus syndrome: Secondary | ICD-10-CM

## 2017-12-07 DIAGNOSIS — I2510 Atherosclerotic heart disease of native coronary artery without angina pectoris: Secondary | ICD-10-CM | POA: Diagnosis not present

## 2017-12-07 NOTE — Progress Notes (Signed)
ELECTROPHYSIOLOGY OFFICE  NOTE  Patient ID: Dawn Garcia, MRN: [id number], DOB/AGE: 77-Aug-1942 77 y.o. Admit date: (Not on file) Date of Consult: 12/07/2017  Primary Physician: Perrin Maltese, MD Primary Cardiologist: tg       HPI Dawn Garcia is a 77 y.o. female  Seen in followup for pacing by Dr. 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 This was complicated by right atrial lead dislodgment requiring revision.  Because of recurrent atrial fibrillation he started her on dronaderone in the fall which was complicated by diarrhea. It was her impression however that prior to its discontinuation it had been associated with a marked improvement in exercise tolerance, allowing her to walk a flight of stairs without stopping for example.  She has a history of a prior PVI.     DATE TEST    6/17    Echo   EF 65 %   11/17    Myoview   EF 54 % Anteroapical defect  8/18 Echo  EF 50-55%  possible wall motion abnormality-reviewed by TG no further workup necessary  2/19 Cath    moderate mid-to distal LAD with patent circumflex stent//LVEDP 8//RA 11-notably patient was in sinus rhythm  2/19` Echo   EF 50-55%           Her amiodarone has been discontinued.  Plan was to dofetilide and catheter ablation.  Amiodarone level has remained at 0.6 last checked 07/03/2017  Date Cr K Hgb  12/17     12/18 1.19 3.9    1/19 1.69 3.1 16.4  2/19 1.69 4.3   7/19 1.36 3.9 12.6    Seen by DC NP 8/19 with no SOB noted;   Device interrrogation had suggested come competitive pacing and device reprogrammed--  More SOB and with chest throat discomfort--antedated cath 2/19   No change since reprogramming  Felt terrible earlier this week prompting the call re sob and CP>> assoc with palps  Device transmission showed little See Below     Thromboembolic risk factors ( age  -2, DM-1, Vasc disease -1, CHF-1, Gender-1) for a CHADSVASc Score of 6      Past Medical History:  Diagnosis Date  .  Age-related macular degeneration, dry, right eye   . Age-related macular degeneration, wet, left eye ([place])   . Arthritis    "all over; particularly in hands/fingers; all my joints" (10/05/2017)  . Atrial fibrillation ([place])   . CAD (coronary artery disease)   . CHF (congestive heart failure) (HCC)     A-Fib  . Chronic kidney disease   . COPD (chronic obstructive pulmonary disease) ([place])    "very mild" (10/05/2017)  . Family history of adverse reaction to anesthesia    "daughter w/PONV & woke up during endoscopy" (10/05/2017)  . GERD (gastroesophageal reflux disease)   . Graves' disease    S/P "radioactive tx"  . History of blood transfusion    "related to shoulder replacement"   . History of gout    "no longer on daily RX" (10/05/2017)  . History of hiatal hernia   . Hyperlipidemia   . Hypertension   . Hypocalcemia   . Hypokalemia   . Hypothyroidism   . Macular degeneration   . Melanoma ([place]) ~ 2012   "off my back" (10/05/2017)  . Myocardial infarction [facility])    "was told I'd had one; don't really know when" (10/05/2017)  . OSA on CPAP   . 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 Sinus node dysfunction (HCC)   . Type II diabetes mellitus ([place])   . Vitamin B12 deficiency   . Vitamin D deficiency       Surgical History:  Past Surgical History:  Procedure Laterality Date  . APPENDECTOMY    . ATRIAL FIBRILLATION ABLATION N/A 10/05/2017   Procedure: ATRIAL FIBRILLATION ABLATION;  Surgeon: Constance Haw, MD;  Location: [place] [facility];  Service: Cardiovascular;  Laterality: N/A;  . ATRIAL FIBRILLATION ABLATION  ~ 2014  . CARDIAC CATHETERIZATION  04/2017  . COLONOSCOPY W/ BIOPSIES AND POLYPECTOMY  2015   1 polyp removed- repeat 3 years  . CORONARY ANGIOPLASTY WITH STENT PLACEMENT  ~ 2014  . EP IMPLANTABLE DEVICE N/A 11/10/2015   Procedure: Pacemaker Implant;  Surgeon: Will [name], MD;   Location: [place];  Service: Cardiovascular;  Laterality: N/A;  . EP IMPLANTABLE DEVICE N/A 11/11/2015   Procedure: Lead Revision/Repair;  Surgeon: Evans Lance, MD;  Location: [place] [facility];  Service: Cardiovascular;  Laterality: N/A;  . INGUINAL HERNIA REPAIR Left 1951  . JOINT REPLACEMENT    . LAPAROSCOPIC CHOLECYSTECTOMY    . MELANOMA EXCISION  ~ 2012   "off my back" (10/05/2017)  . PARATHYROIDECTOMY     "removed 3 glands; still have 1 gland" (10/05/2017)  . RIGHT/LEFT HEART CATH AND CORONARY ANGIOGRAPHY N/A 05/03/2017   Procedure: RIGHT/LEFT HEART CATH AND CORONARY ANGIOGRAPHY;  Surgeon: Leonie Man, MD;  Location: [place] [facility];  Service: Cardiovascular;  Laterality: N/A;  . TONSILLECTOMY    . TOTAL SHOULDER ARTHROPLASTY Right x 2  . VAGINAL HYSTERECTOMY     "total"     Home Meds: Prior to Admission medications   Medication Sig Start Date End Date Taking? Authorizing Provider  albuterol (PROVENTIL HFA;VENTOLIN HFA) 108 (90 Base) MCG/ACT inhaler Inhale 2 puffs into the lungs every 6 (six) hours as needed for wheezing or shortness of breath.   Yes Historical Provider, MD  ARTIFICIAL TEAR OP Place 1 drop into both eyes as needed (for dry eyes).   Yes Historical Provider, MD  Cholecalciferol (VITAMIN D3) 5000 units TABS Take 1 tablet by mouth every morning.   Yes Historical Provider, MD  furosemide (LASIX) 20 MG tablet Take 60 mg by mouth every morning.   Yes Historical Provider, MD  isosorbide mononitrate (IMDUR) 30 MG 24 hr tablet Take 1 tablet (30 mg total) by mouth daily. 02/11/16  Yes Will [name], MD  levothyroxine (SYNTHROID, LEVOTHROID) 150 MCG tablet Take 1 tablet (150 mcg total) by mouth daily. 12/08/15  Yes Glean Hess, MD  Melatonin 5 MG TABS Take 1 tablet by mouth at bedtime.   Yes Historical Provider, MD  metFORMIN (GLUCOPHAGE) 500 MG tablet Take 1 tablet (500 mg total) by mouth 2 (two) times daily with a meal. 09/16/15  Yes Adline Potter,  MD  metoprolol succinate (TOPROL XL) 25 MG 24 hr tablet Take 1 tablet (25 mg total) by mouth daily. 02/11/16  Yes Will [name], MD  nitroGLYCERIN (NITROSTAT) 0.4 MG SL tablet Place 0.4 mg under the tongue every 5 (five) minutes as needed for chest pain.   Yes Historical Provider, MD  pantoprazole (PROTONIX) 40 MG tablet Take 1 tablet (40 mg total) by mouth daily. 10/16/15  Yes Juline Patch, MD  potassium chloride (KLOR-CON) 8 MEQ tablet Take 1 tablet (8 mEq total) by mouth 2 (two) times daily. 12/21/15  Yes Glean Hess, MD  rosuvastatin (CRESTOR) 40 MG tablet Take 1 tablet (40 mg total) by mouth daily. 09/18/15  Yes Minna Merritts, MD  sitaGLIPtin (JANUVIA) 50 MG tablet Take 50 mg by mouth daily.   Yes Historical Provider, MD  vitamin B-12 (CYANOCOBALAMIN) 500 MCG tablet Take 500 mcg by mouth daily.   Yes Historical Provider, MD  warfarin (COUMADIN) 7.5 MG tablet Take 1 tablet (7.5 mg total) by mouth daily. 01/20/16  Yes Minna Merritts, MD    Allergies:  Allergies  Allergen Reactions  . Demerol [Meperidine] Anaphylaxis    Tolerated Fentanyl 11/10/15  . Multaq [Dronedarone] Diarrhea  . Sulfa Antibiotics Anaphylaxis  . Tetracyclines & Related Other (See Comments) and Swelling    Made nose, lips,  And tongue itchy Made nose, lips,  And tongue itchy    Social History   Socioeconomic History  . Marital status: Married    Spouse name: Not on file  . Number of children: Not on file  . Years of education: Not on file  . Highest education level: Not on file  Occupational History  . Occupation: Retired  Scientific laboratory technician  . Financial resource strain: Not on file  . Food insecurity:    Worry: Not on file    Inability: Not on file  . Transportation needs:    Medical: Not on file    Non-medical: Not on file  Tobacco Use  . Smoking status: Former Smoker    Packs/day: 2.00    Years: 3.00    Pack years: 6.00    Types: Cigarettes  . Smokeless tobacco: Never Used  . Tobacco  comment: 10/05/2017 "smoked in my 20's"  Substance and Sexual Activity  . Alcohol use: Yes    Comment: 10/05/2017 "1 mixed drink q 2-3 months"  . Drug use: Never  . Sexual activity: Not on file  Lifestyle  . Physical activity:    Days per week: Not on file    Minutes per session: Not on file  . Stress: Not on file  Relationships  . Social connections:    Talks on phone: Not on file    Gets together: Not on file    Attends religious service: Not on file    Active member of club or organization: Not on file    Attends meetings of clubs or organizations: Not on file    Relationship status: Not on file  . Intimate partner violence:    Fear of current or ex partner: Not on file    Emotionally abused: Not on file    Physically abused: Not on file    Forced sexual activity: Not on file  Other Topics Concern  . Not on file  Social History Narrative   Lives in [place] w/ husband.     Family History  Problem Relation Age of Onset  . Thyroid disease Mother 74       3 days after surgery  . Heart defect Father 49       ?ascending aortic aneurysm  . Breast cancer Sister 31       half sister     ROS:  Please see the history of present illness.     All other systems reviewed and negative.   BP 140/80 (BP Location: Left Arm, Patient Position: Sitting, Cuff Size: Normal)   Pulse 60   Ht 5\' 3"  (1.6 m)   Wt 216 lb 12 oz (98.3 kg)   BMI 38.40 kg/m  Well developed  and nourished in no acute distress HENT normal Neck supple with JVP-7/8 Clear Regular rate and rhythm, no murmurs or gallops Abd-soft with active BS No Clubbing cyanosis edema Skin-warm and dry A & Oriented  Grossly normal sensory and motor function  Labs: Cardiac Enzymes No results for input(s): CKTOTAL, CKMB, TROPONINI in the last 72 hours. CBC Lab Results  Component Value Date   WBC 6.7 09/13/2017   HGB 12.6 09/13/2017   HCT 39.5 09/13/2017   MCV 85 09/13/2017   PLT 194 09/13/2017   PROTIME: No results  for input(s): LABPROT, INR in the last 72 hours. Chemistry  No results for input(s): NA, K, CL, CO2, BUN, CREATININE, CALCIUM, PROT, BILITOT, ALKPHOS, ALT, AST, GLUCOSE in the last 168 hours.  Invalid input(s): LABALBU Lipids Lab Results  Component Value Date   CHOL 127 04/19/2016   HDL 42 04/19/2016   LDLCALC 43 04/19/2016   TRIG 209 (H) 04/19/2016   BNP No results found for: PROBNP Thyroid Function Tests: No results for input(s): TSH, T4TOTAL, T3FREE, THYROIDAB in the last 72 hours.  Invalid input(s): FREET3 Miscellaneous Lab Results  Component Value Date   DDIMER <0.27 04/28/2017    Radiology/Studies:  No results found.  EKG: AV pacing  Assessment and Plan:  Sick Sinus syndrome  Persistent Atrial Fib with prior PVI and redo 7/19  Pacemaker St Jude   OSA  P-wave oversensing resulting false detection of atrial fibrillation  HFpEF   chronic   Coronary artery disease with prior stenting  Renal insufficiency-improved   Chest pain  SOB   The patient's symptoms may correlate with tachycardia.  Her device had a lower detection rate for atrial fibrillation mode switch of 130.  We decrease it to 110.  This may give [place] a better correlation of her symptoms with arrhythmia.  There is on device interrogation overall trend towards lesser burdens of atrial arrhythmia  She is to follow-up with Dr. Carlyn Reichert as scheduled.  We will see her following his release  We spent more than 50% of our >25 min visit in face to face counseling regarding the above       Virl Axe

## 2017-12-07 NOTE — Patient Instructions (Signed)
Medication Instructions: - Your physician recommends that you continue on your current medications as directed. Please refer to the Current Medication list given to you today.  Labwork: - none ordered  Procedures/Testing: - none ordered  Follow-Up: - with Dr. Curt Bears as scheduled   Any Additional Special Instructions Will Be Listed Below (If Applicable).     If you need a refill on your cardiac medications before your next appointment, please call your pharmacy.

## 2017-12-18 DIAGNOSIS — H353112 Nonexudative age-related macular degeneration, right eye, intermediate dry stage: Secondary | ICD-10-CM | POA: Diagnosis not present

## 2017-12-18 DIAGNOSIS — H35033 Hypertensive retinopathy, bilateral: Secondary | ICD-10-CM | POA: Diagnosis not present

## 2017-12-18 DIAGNOSIS — H35433 Paving stone degeneration of retina, bilateral: Secondary | ICD-10-CM | POA: Diagnosis not present

## 2017-12-18 DIAGNOSIS — H353221 Exudative age-related macular degeneration, left eye, with active choroidal neovascularization: Secondary | ICD-10-CM | POA: Diagnosis not present

## 2017-12-19 ENCOUNTER — Ambulatory Visit (INDEPENDENT_AMBULATORY_CARE_PROVIDER_SITE_OTHER): Payer: Medicare Other | Admitting: *Deleted

## 2017-12-19 DIAGNOSIS — I495 Sick sinus syndrome: Secondary | ICD-10-CM | POA: Diagnosis not present

## 2017-12-19 DIAGNOSIS — I471 Supraventricular tachycardia: Secondary | ICD-10-CM

## 2017-12-20 NOTE — Progress Notes (Signed)
Remote pacemaker transmission.   

## 2017-12-27 ENCOUNTER — Encounter: Payer: Self-pay | Admitting: Cardiology

## 2018-01-02 DIAGNOSIS — J449 Chronic obstructive pulmonary disease, unspecified: Secondary | ICD-10-CM | POA: Diagnosis not present

## 2018-01-02 DIAGNOSIS — J9811 Atelectasis: Secondary | ICD-10-CM | POA: Diagnosis not present

## 2018-01-02 DIAGNOSIS — R0609 Other forms of dyspnea: Secondary | ICD-10-CM | POA: Diagnosis not present

## 2018-01-03 ENCOUNTER — Telehealth (HOSPITAL_COMMUNITY): Payer: Self-pay | Admitting: *Deleted

## 2018-01-03 NOTE — Telephone Encounter (Signed)
Patient called in stating she woke up this morning in AF just doesn't feel well. Intermittent chest pain which pt endorses is normal for her when in AF, fatigue. HR in the 70-80s BP 148/84 which is high for her she states. Instructed pt to take her cardizem now and we will see in the AM unless she converted overnight.

## 2018-01-04 ENCOUNTER — Other Ambulatory Visit: Payer: Self-pay | Admitting: Internal Medicine

## 2018-01-04 ENCOUNTER — Ambulatory Visit (HOSPITAL_COMMUNITY): Payer: Medicare Other | Admitting: Nurse Practitioner

## 2018-01-09 ENCOUNTER — Ambulatory Visit (INDEPENDENT_AMBULATORY_CARE_PROVIDER_SITE_OTHER): Payer: Medicare Other | Admitting: Cardiology

## 2018-01-09 ENCOUNTER — Encounter: Payer: Self-pay | Admitting: Cardiology

## 2018-01-09 VITALS — BP 134/70 | HR 61 | Ht 63.0 in | Wt 215.2 lb

## 2018-01-09 DIAGNOSIS — I480 Paroxysmal atrial fibrillation: Secondary | ICD-10-CM

## 2018-01-09 DIAGNOSIS — I2510 Atherosclerotic heart disease of native coronary artery without angina pectoris: Secondary | ICD-10-CM | POA: Diagnosis not present

## 2018-01-09 DIAGNOSIS — I495 Sick sinus syndrome: Secondary | ICD-10-CM

## 2018-01-09 DIAGNOSIS — G4733 Obstructive sleep apnea (adult) (pediatric): Secondary | ICD-10-CM

## 2018-01-09 MED ORDER — DILTIAZEM HCL ER COATED BEADS 180 MG PO CP24: 180.0000 mg | ORAL_CAPSULE | Freq: Every day | ORAL | 3 refills | Status: DC

## 2018-01-09 NOTE — Patient Instructions (Addendum)
Medication Instructions:  Your physician has recommended you make the following change in your medication:  1. INCREASE Diltiazem to 180 mg once daily  If you need a refill on your cardiac medications before your next appointment, please call your pharmacy.   Lab work: None ordered  Testing/Procedures: None ordered  Follow-Up: Remote monitoring is used to monitor your Pacemaker from home. This monitoring reduces the number of office visits required to check your device to one time per year. It allows [place] to keep an eye on the functioning of your device to ensure it is working properly. You are scheduled for a device check from home on 03/20/2018. You may send your transmission at any time that day. If you have a wireless device, the transmission will be sent automatically. After your physician reviews your transmission, you will receive a postcard with your next transmission date.  Your physician recommends that you schedule a follow-up appointment in: 3 months with Dr. Caryl Comes in [place].  No follow up is needed at this time with Dr. Curt Bears.  He will see you on an as needed basis.  Thank you for choosing [facility]!!

## 2018-01-09 NOTE — Progress Notes (Signed)
Electrophysiology Office Note   Date:  01/09/2018   ID:  Dawn Garcia, DOB 25-Jun-1940, MRN [id number]  PCP:  Perrin Maltese, MD  Cardiologist:  Rockey Situ Primary Electrophysiologist:  Barnetta Hammersmith [name], MD    No chief complaint on file.    History of Present Illness: Dawn Garcia is a 77 y.o. female who presents today for electrophysiology evaluation.   PMHx of CAD (remote stent, patent by cath 2014), PAFib reportedly with an ablation in 2013, COPD, DM, HLD, hypothyroidism, reported history of bradycardia. Was noted to have 5 second pauses on a monitor and a dual chamber pacemaker was placed 11/09/15. Had RA lead revision due to dislodgement.  She is status post repeat atrial fibrillation ablation on 10/05/2017.  Today, denies symptoms of palpitations, chest pain, shortness of breath, orthopnea, PND, lower extremity edema, claudication, dizziness, presyncope, syncope, bleeding, or neurologic sequela. The patient is tolerating medications without difficulties.  Overall she is feeling well.  She has had much less atrial fibrillation since her ablation.  She does continue to have occasional episodes of palpitations.  She otherwise is felt much improved.   Past Medical History:  Diagnosis Date  . Age-related macular degeneration, dry, right eye   . Age-related macular degeneration, wet, left eye ([place])   . Arthritis    "all over; particularly in hands/fingers; all my joints" (10/05/2017)  . Atrial fibrillation ([place])   . CAD (coronary artery disease)   . CHF (congestive heart failure) (HCC)     A-Fib  . Chronic kidney disease   . COPD (chronic obstructive pulmonary disease) ([place])    "very mild" (10/05/2017)  . Family history of adverse reaction to anesthesia    "daughter w/PONV & woke up during endoscopy" (10/05/2017)  . GERD (gastroesophageal reflux disease)   . Graves' disease    S/P "radioactive tx"  . History of blood transfusion    "related to shoulder replacement"    . History of gout    "no longer on daily RX" (10/05/2017)  . History of hiatal hernia   . Hyperlipidemia   . Hypertension   . Hypocalcemia   . Hypokalemia   . Hypothyroidism   . Macular degeneration   . Melanoma ([place]) ~ 2012   "off my back" (10/05/2017)  . Myocardial infarction [facility])    "was told I'd had one; don't really know when" (10/05/2017)  . OSA on CPAP   . 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 Sinus node dysfunction (HCC)   . Type II diabetes mellitus ([place])   . Vitamin B12 deficiency   . Vitamin D deficiency    Past Surgical History:  Procedure Laterality Date  . APPENDECTOMY    . ATRIAL FIBRILLATION ABLATION N/A 10/05/2017   Procedure: ATRIAL FIBRILLATION ABLATION;  Surgeon: Constance Haw, MD;  Location: [place] [facility];  Service: Cardiovascular;  Laterality: N/A;  . ATRIAL FIBRILLATION ABLATION  ~ 2014  . CARDIAC CATHETERIZATION  04/2017  . COLONOSCOPY W/ BIOPSIES AND POLYPECTOMY  2015   1 polyp removed- repeat 3 years  . CORONARY ANGIOPLASTY WITH STENT PLACEMENT  ~ 2014  . EP IMPLANTABLE DEVICE N/A 11/10/2015   Procedure: Pacemaker Implant;  Surgeon: Joe Tanney [name], MD;  Location: [place] [facility];  Service: Cardiovascular;  Laterality: N/A;  . EP IMPLANTABLE DEVICE N/A 11/11/2015   Procedure: Lead Revision/Repair;  Surgeon: Evans Lance, MD;  Location: [place] [facility]  [facility];  Service: Cardiovascular;  Laterality: N/A;  . INGUINAL HERNIA REPAIR Left 1951  . JOINT REPLACEMENT    . LAPAROSCOPIC CHOLECYSTECTOMY    . MELANOMA EXCISION  ~ 2012   "off my back" (10/05/2017)  . PARATHYROIDECTOMY     "removed 3 glands; still have 1 gland" (10/05/2017)  . RIGHT/LEFT HEART CATH AND CORONARY ANGIOGRAPHY N/A 05/03/2017   Procedure: RIGHT/LEFT HEART CATH AND CORONARY ANGIOGRAPHY;  Surgeon: Leonie Man, MD;  Location: [place] [facility];  Service: Cardiovascular;  Laterality: N/A;  .  TONSILLECTOMY    . TOTAL SHOULDER ARTHROPLASTY Right x 2  . VAGINAL HYSTERECTOMY     "total"     Current Outpatient Medications  Medication Sig Dispense Refill  . carboxymethylcellulose (REFRESH PLUS) 0.5 % SOLN Place 1 drop into both eyes 3 (three) times daily as needed.    . Cholecalciferol (VITAMIN D3) 5000 units TABS Take 1 tablet by mouth every morning.    . diphenhydramine-acetaminophen (TYLENOL PM) 25-500 MG TABS tablet Take 2 tablets by mouth at bedtime.     Marland Kitchen ELIQUIS 5 MG TABS tablet TAKE ONE TABLET BY MOUTH TWICE DAILY 180 tablet 1  . insulin NPH-regular Human (NOVOLIN 70/30) (70-30) 100 UNIT/ML injection Inject into the skin. Inject 16 units in the morning and inject 20 units in the evening    . levothyroxine (SYNTHROID, LEVOTHROID) 150 MCG tablet Take 1 tablet (150 mcg total) by mouth daily before breakfast.    . Multiple Vitamins-Minerals (ICAPS AREDS 2) CAPS Take 1 capsule by mouth 2 (two) times daily.    . nitroGLYCERIN (NITROSTAT) 0.4 MG SL tablet Place 0.4 mg under the tongue every 5 (five) minutes as needed for chest pain.    . pantoprazole (PROTONIX) 40 MG tablet Take 1 tablet (40 mg total) by mouth daily. 90 tablet 3  . potassium chloride (KLOR-CON) 8 MEQ tablet Take 2 tablets (16 meq) in the morning & 1 tablet (8 meq) in the evening 270 tablet 3  . rosuvastatin (CRESTOR) 40 MG tablet TAKE 1 TABLET BY MOUTH ONCE DAILY 90 tablet 3  . torsemide (DEMADEX) 20 MG tablet Take 2 tablets (40 mg total) by mouth daily. 60 tablet 0  . vitamin B-12 (CYANOCOBALAMIN) 500 MCG tablet Take 500 mcg by mouth daily.    Marland Kitchen diltiazem (CARDIZEM CD) 180 MG 24 hr capsule Take 1 capsule (180 mg total) by mouth daily. 30 capsule 3   No current facility-administered medications for this visit.     Allergies:   Demerol [meperidine]; Multaq [dronedarone]; Sulfa antibiotics; and Tetracyclines & related   Social History:  The patient  reports that she has quit smoking. Her smoking use included  cigarettes. She has a 6.00 pack-year smoking history. She has never used smokeless tobacco. She reports that she drinks alcohol. She reports that she does not use drugs.   Family History:  The patient's family history includes Breast cancer (age of onset: 10) in her sister; Heart defect (age of onset: 52) in her father; Thyroid disease (age of onset: 67) in her mother.   ROS:  Please see the history of present illness.   Otherwise, review of systems is positive for fatigue, chest pain, leg swelling, leg pain, palpitations, cough, shortness of breath, wheezing, back pain.   All other systems are reviewed and negative.   PHYSICAL EXAM: VS:  BP 134/70   Pulse 61   Ht 5\' 3"  (1.6 m)   Wt 215 lb 3.2 oz (97.6 kg)  SpO2 98%   BMI 38.12 kg/m  , BMI Body mass index is 38.12 kg/m. GEN: Well nourished, well developed, in no acute distress  HEENT: normal  Neck: no JVD, carotid bruits, or masses Cardiac: RRR; no murmurs, rubs, or gallops,no edema  Respiratory:  clear to auscultation bilaterally, normal work of breathing GI: soft, nontender, nondistended, + BS MS: no deformity or atrophy  Skin: warm and dry, device site well healed Neuro:  Strength and sensation are intact Psych: euthymic mood, full affect  EKG:  EKG is ordered today. Personal review of the ekg ordered shows atrial paced with intermittent ventricular pacing, prolonged QTC  Personal review of the device interrogation today. Results in [place]: 04/25/2017: B Natriuretic Peptide 57.0 04/26/2017: TSH 0.198 05/04/2017: ALT 50 05/16/2017: Magnesium 2.4 09/13/2017: BUN 22; Creatinine, Ser 1.36; Hemoglobin 12.6; Platelets 194; Potassium 3.9; Sodium 140    Lipid Panel     Component Value Date/Time   CHOL 127 04/19/2016 0857   TRIG 209 (H) 04/19/2016 0857   HDL 42 04/19/2016 0857   CHOLHDL 3.0 04/19/2016 0857   LDLCALC 43 04/19/2016 0857     Wt Readings from Last 3 Encounters:  01/09/18 215 lb 3.2 oz (97.6 kg)    12/07/17 216 lb 12 oz (98.3 kg)  12/05/17 214 lb 4 oz (97.2 kg)      Other studies Reviewed: Additional studies/ records that were reviewed today include: TTE 04/29/17 Review of the above records today demonstrates:  - Left ventricle: The cavity size was normal. Wall thickness was   increased in a pattern of mild LVH. Systolic function was normal.   The estimated ejection fraction was in the range of 50% to 55%.   Doppler parameters are consistent with abnormal left ventricular   relaxation (grade 1 diastolic dysfunction). The E/e&' ratio is   between 8-15, suggesting indeterminate LV filling pressure. - Aortic valve: Trileaflet. Sclerosis without stenosis. There was   trivial regurgitation. - Mitral valve: Mildly thickened leaflets . There was trivial   regurgitation. - Left atrium: The atrium was normal in size. - Inferior vena cava: The vessel was normal in size. The   respirophasic diameter changes were in the normal range (>= 50%),   consistent with normal central venous pressure.  [place] 05/03/17  Relatively normal right heart cath pressures with only mildly elevated LVEDP/PCP.  Prox LAD to Mid LAD lesion is 45% stenosed. Mid LAD lesion is 40% stenosed. Neither lesion was flow-limiting  Lat 2nd Diag lesion is 70% stenosed. Vessel is way too small for PCI  Previously placed Ost 1st Mrg stent (unknown type) is widely patent.  ASSESSMENT AND PLAN:  1.  Sick sinus syndrome: This post [place] dual-chamber pacemaker implanted 2017.  Functioning appropriately.  No changes at this time.  2. Sleep apnea: Compliant with CPAP  3. Paroxysmal atrial fibrillation: Currently on Eliquis.  Prior ablation in [place].  Device interrogation showed 9% atrial fibrillation.  She had repeat ablation 10/05/2017.  She has short episodes of atrial fibrillation since ablation.  We Sabella Traore thus increase her diltiazem to 180 mg.  She wishes to follow-up with Dr. Caryl Comes in [place].  This patients  CHA2DS2-VASc Score and unadjusted Ischemic Stroke Rate (% per year) is equal to 4.8 % stroke rate/year from a score of 4  Above score calculated as 1 point each if present [CHF, HTN, DM, Vascular=MI/PAD/Aortic Plaque, Age if 65-74, or Female] Above score calculated as 2 points each if present [Age >  75, or Stroke/TIA/TE]   4.  Coronary artery disease: Stenting in 2014.  Recent stress test without major abnormality.  Current medicines are reviewed at length with the patient today.   The patient does not have concerns regarding her medicines.  The following changes were made today: Increase diltiazem  Labs/ tests ordered today include:  Orders Placed This Encounter  Procedures  . EKG 12-Lead     Disposition:   FU with Jolyn Nap 3 months  Signed, Malikye Reppond [name], MD  01/09/2018 11:14 AM     [facility] [address] [place] [address] [phone] (office) [phone] (fax)

## 2018-01-12 LAB — CUP PACEART INCLINIC DEVICE CHECK
Date Time Interrogation Session: 20191101095546
Implantable Lead Implant Date: 20170829
Implantable Lead Location: 753859
Implantable Lead Location: 753860
Implantable Pulse Generator Implant Date: 20170829
MDC IDC LEAD IMPLANT DT: 20170829
Pulse Gen Serial Number: 7945290

## 2018-01-16 DIAGNOSIS — L578 Other skin changes due to chronic exposure to nonionizing radiation: Secondary | ICD-10-CM | POA: Diagnosis not present

## 2018-01-16 DIAGNOSIS — Z872 Personal history of diseases of the skin and subcutaneous tissue: Secondary | ICD-10-CM | POA: Diagnosis not present

## 2018-01-16 DIAGNOSIS — L821 Other seborrheic keratosis: Secondary | ICD-10-CM | POA: Diagnosis not present

## 2018-01-16 DIAGNOSIS — D170 Benign lipomatous neoplasm of skin and subcutaneous tissue of head, face and neck: Secondary | ICD-10-CM | POA: Diagnosis not present

## 2018-01-16 DIAGNOSIS — Z1283 Encounter for screening for malignant neoplasm of skin: Secondary | ICD-10-CM | POA: Diagnosis not present

## 2018-01-29 DIAGNOSIS — H353112 Nonexudative age-related macular degeneration, right eye, intermediate dry stage: Secondary | ICD-10-CM | POA: Diagnosis not present

## 2018-01-29 DIAGNOSIS — H353221 Exudative age-related macular degeneration, left eye, with active choroidal neovascularization: Secondary | ICD-10-CM | POA: Diagnosis not present

## 2018-01-29 DIAGNOSIS — E1169 Type 2 diabetes mellitus with other specified complication: Secondary | ICD-10-CM | POA: Diagnosis not present

## 2018-01-29 DIAGNOSIS — M19042 Primary osteoarthritis, left hand: Secondary | ICD-10-CM | POA: Diagnosis not present

## 2018-01-29 DIAGNOSIS — I10 Essential (primary) hypertension: Secondary | ICD-10-CM | POA: Diagnosis not present

## 2018-01-29 DIAGNOSIS — J069 Acute upper respiratory infection, unspecified: Secondary | ICD-10-CM | POA: Diagnosis not present

## 2018-01-29 DIAGNOSIS — H35033 Hypertensive retinopathy, bilateral: Secondary | ICD-10-CM | POA: Diagnosis not present

## 2018-01-29 DIAGNOSIS — E782 Mixed hyperlipidemia: Secondary | ICD-10-CM | POA: Diagnosis not present

## 2018-01-29 DIAGNOSIS — H35372 Puckering of macula, left eye: Secondary | ICD-10-CM | POA: Diagnosis not present

## 2018-02-05 ENCOUNTER — Encounter: Payer: Self-pay | Admitting: Internal Medicine

## 2018-02-05 ENCOUNTER — Ambulatory Visit (INDEPENDENT_AMBULATORY_CARE_PROVIDER_SITE_OTHER): Payer: Medicare Other | Admitting: Internal Medicine

## 2018-02-05 VITALS — BP 130/80 | HR 75 | Ht 63.0 in | Wt 218.0 lb

## 2018-02-05 DIAGNOSIS — E1159 Type 2 diabetes mellitus with other circulatory complications: Secondary | ICD-10-CM

## 2018-02-05 DIAGNOSIS — I2510 Atherosclerotic heart disease of native coronary artery without angina pectoris: Secondary | ICD-10-CM | POA: Diagnosis not present

## 2018-02-05 DIAGNOSIS — E1165 Type 2 diabetes mellitus with hyperglycemia: Secondary | ICD-10-CM

## 2018-02-05 DIAGNOSIS — Z9889 Other specified postprocedural states: Secondary | ICD-10-CM

## 2018-02-05 DIAGNOSIS — Z9009 Acquired absence of other part of head and neck: Secondary | ICD-10-CM

## 2018-02-05 DIAGNOSIS — E890 Postprocedural hypothyroidism: Secondary | ICD-10-CM | POA: Diagnosis not present

## 2018-02-05 DIAGNOSIS — E213 Hyperparathyroidism, unspecified: Secondary | ICD-10-CM | POA: Diagnosis not present

## 2018-02-05 DIAGNOSIS — E892 Postprocedural hypoparathyroidism: Secondary | ICD-10-CM

## 2018-02-05 LAB — POCT GLYCOSYLATED HEMOGLOBIN (HGB A1C): HEMOGLOBIN A1C: 8.4 % — AB (ref 4.0–5.6)

## 2018-02-05 LAB — T3, FREE: T3 FREE: 3.4 pg/mL (ref 2.3–4.2)

## 2018-02-05 LAB — T4, FREE: FREE T4: 1.4 ng/dL (ref 0.60–1.60)

## 2018-02-05 LAB — TSH: TSH: 0.04 u[IU]/mL — ABNORMAL LOW (ref 0.35–4.50)

## 2018-02-05 MED ORDER — LEVOTHYROXINE SODIUM 137 MCG PO TABS: 137.0000 ug | ORAL_TABLET | ORAL | 3 refills | Status: DC

## 2018-02-05 MED ORDER — LEVOTHYROXINE SODIUM 150 MCG PO TABS: 150.0000 ug | ORAL_TABLET | ORAL | 3 refills | Status: DC

## 2018-02-05 NOTE — Progress Notes (Addendum)
Patient ID: Dawn Garcia, female   DOB: Feb 07, 1941, 77 y.o.   MRN: [id number]    HPI  Dawn Garcia is a 77 y.o.-year-old female, referred by her PCP, Dr. Humphrey Rolls, for management of uncontrolled post ablative hypothyroidism.  She is here with offers part of the history, especially about her past medical history.  Pt. has been dx with Graves ds. At 77 y/o. She had RAI Tx in 1960 >> postablative hypothyroidism  >> started on levothyroxine, currently 150 mcg daily..  She takes the thyroid hormone: - fasting - with water - decaf + coffee - separated by >1h from b'fast  - + Protonix at night (moved from am approximately 2 months ago) - no calcium, iron, multivitamins  Off Biotin.  Of note, she was on amiodarone in 03/2017 >> stopped 04/2017 b/c AKI.  I reviewed pt's thyroid tests: 10/25/2017: TSH 0.021 (0.45-5.33), free T4 1.7 (0.66-1.14) Lab Results  Component Value Date   TSH 0.198 (L) 04/26/2017   TSH 0.174 (L) 04/18/2017   TSH 0.034 (L) 03/10/2016   TSH 0.050 (L) 12/08/2015   TSH 0.061 (L) 11/08/2015   TSH 0.079 (L) 09/09/2015   TSH 0.229 (L) 08/19/2015   FREET4 3.54 (H) 04/26/2017   FREET4 2.08 (H) 11/08/2015   FREET4 2.18 (H) 09/09/2015   FREET4 0.93 08/19/2015   T3FREE 1.8 (L) 04/26/2017   T3FREE 3.0 11/08/2015   T3FREE 2.8 09/09/2015   T3FREE 3.1 08/19/2015   Antithyroid antibodies: No results found for: TSI  No results found for: THGAB No components found for: TPOAB  Pt describes: weight gain fatigue, poor sleep No heat or cold intolerance No anxiety or depression Diarrhea/constipation Hair is thinning Headaches Generalized joint pain and swelling  Pt denies: - feeling nodules in anterior neck - dysphagia - choking - SOB with lying down However, she does have hoarseness.  She has + FH of thyroid disorders in: mother (died after sx for toxic goiter) and sister (goiter). No FH of thyroid cancer.  No h/o radiation tx to head or neck except RAI tx and  eye RAI Tx.  She had GO >> had increased IO pressure and did not respond to IV steroids >> had RxTx and then eyelid sx.  Pt. also has a history of uncontrolled diabetes.  This is currently managed by PCP.  Her latest HbA1c: 10/25/2017: HbA1c 8.4% Lab Results  Component Value Date   HGBA1C 9.4 (H) 04/28/2017   HGBA1C 7.8 (H) 11/29/2016   HGBA1C 8.1 (H) 09/05/2016   HGBA1C 8.2 (H) 05/02/2016   HGBA1C 7.5 (H) 03/10/2016   HGBA1C 7.8 (H) 12/08/2015   HGBA1C 6.9 (H) 08/19/2015   She is on Novolin insulin 70/30 - started insulin in 2013. - 16 units (and if CBG in the am >130), add 20 units at bedtime Tried Jardiance >> diarrhea.  CBGs: - am: 124, 130-168, 180, 196 - at bedtime: 137-205, 265  She has a history of CKD, with the latest kidney function:  10/25/2017: BUN/creatinine: 17/1.4, GFR 37 Lab Results  Component Value Date   BUN 22 09/13/2017   BUN 20 05/19/2017   Lab Results  Component Value Date   CREATININE 1.36 (H) 09/13/2017   CREATININE 1.39 (H) 05/19/2017   She has a h/o hyperparathyroidism  - h/o first sx: 1978 (2 glands excised) - h/o second sx: 2010 (1 gland excised - 3.2 g)  She is not on any calcium.   Lab Results  Component Value Date   CALCIUM 9.1 09/13/2017  CALCIUM 9.1 05/19/2017   CALCIUM 8.9 05/16/2017   CALCIUM 9.0 05/04/2017   CALCIUM 9.0 05/03/2017   She also has a history of sleep apnea-on CPAP, osteoarthritis, history of melanoma, macular degeneration.  ROS: Constitutional:  + see HPI Eyes: + Blurry vision, no xerophthalmia ENT: no sore throat, + see HPI, + tinnitus, + hypoacusis Cardiovascular: + All: CP/SOB/palpitations/leg swelling Respiratory: + All: Cough/SOB/wheezing Gastrointestinal: + Heartburn/diarrhea/constipation, no nausea/vomiting Musculoskeletal: no muscle/+ joint aches Skin: no rashes, + hair thinning Neurological: no tremors/numbness/tingling/dizziness, + headaches Psychiatric: no depression/anxiety  Past Medical  History:  Diagnosis Date  . Age-related macular degeneration, dry, right eye   . Age-related macular degeneration, wet, left eye ([place])   . Arthritis    "all over; particularly in hands/fingers; all my joints" (10/05/2017)  . Atrial fibrillation ([place])   . CAD (coronary artery disease)   . CHF (congestive heart failure) (H[facility])     A-Fib  . Chronic kidney disease   . COPD (chronic obstructive pulmonary disease) ([place])    "very mild" (10/05/2017)  . Family history of adverse reaction to anesthesia    "daughter w/PONV & woke up during endoscopy" (10/05/2017)  . GERD (gastroesophageal reflux disease)   . Graves' disease    S/P "radioactive tx"  . History of blood transfusion    "related to shoulder replacement"   . History of gout    "no longer on daily RX" (10/05/2017)  . History of hiatal hernia   . Hyperlipidemia   . Hypertension   . Hypocalcemia   . Hypokalemia   . Hypothyroidism   . Macular degeneration   . Melanoma ([place]) ~ 2012   "off my back" (10/05/2017)  . Myocardial infarction [facility])    "was told I'd had one; don't really know when" (10/05/2017)  . OSA on CPAP   . 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 Sinus node dysfunction (H[facility])   . Type II diabetes mellitus ([place])   . Vitamin B12 deficiency   . Vitamin D deficiency    Past Surgical History:  Procedure Laterality Date  . APPENDECTOMY    . ATRIAL FIBRILLATION ABLATION N/A 10/05/2017   Procedure: ATRIAL FIBRILLATION ABLATION;  Surgeon: Constance Haw, MD;  Location: [place] [facility];  Service: Cardiovascular;  Laterality: N/A;  . ATRIAL FIBRILLATION ABLATION  ~ 2014  . CARDIAC CATHETERIZATION  04/2017  . COLONOSCOPY W/ BIOPSIES AND POLYPECTOMY  2015   1 polyp removed- repeat 3 years  . CORONARY ANGIOPLASTY WITH STENT PLACEMENT  ~ 2014  . EP IMPLANTABLE DEVICE N/A 11/10/2015   Procedure: Pacemaker Implant;  Surgeon: Will [name], MD;   Location: [place] [facility];  Service: Cardiovascular;  Laterality: N/A;  . EP IMPLANTABLE DEVICE N/A 11/11/2015   Procedure: Lead Revision/Repair;  Surgeon: Evans Lance, MD;  Location: [place] [facility];  Service: Cardiovascular;  Laterality: N/A;  . INGUINAL HERNIA REPAIR Left 1951  . JOINT REPLACEMENT    . LAPAROSCOPIC CHOLECYSTECTOMY    . MELANOMA EXCISION  ~ 2012   "off my back" (10/05/2017)  . PARATHYROIDECTOMY     "removed 3 glands; still have 1 gland" (10/05/2017)  . RIGHT/LEFT HEART CATH AND CORONARY ANGIOGRAPHY N/A 05/03/2017   Procedure: RIGHT/LEFT HEART CATH AND CORONARY ANGIOGRAPHY;  Surgeon: Leonie Man, MD;  Location: [place] [facility];  Service: Cardiovascular;  Laterality: N/A;  . TONSILLECTOMY    . TOTAL SHOULDER ARTHROPLASTY  Right x 2  . VAGINAL HYSTERECTOMY     "total"   Social History   Socioeconomic History  . Marital status: Married    Spouse name: Not on file  . Number of children: 3  . Years of education: Not on file  . Highest education level: Not on file  Occupational History  . Occupation: Retired Animal nutritionist  . Financial resource strain: Not on file  . Food insecurity:    Worry: Not on file    Inability: Not on file  . Transportation needs:    Medical: Not on file    Non-medical: Not on file  Tobacco Use  . Smoking status: Former Smoker, quit in 1967    Packs/day: 2.00    Years: 3.00    Pack years: 6.00    Types: Cigarettes  . Smokeless tobacco: Never Used  . Tobacco comment: 10/05/2017 "smoked in my 20's"  Substance and Sexual Activity  . Alcohol use: Yes    Comment: 1-2 mixed drinks per month  . Drug use: Never  Lifestyle  Relationships  Social History Narrative   Lives in [place] w/ husband.  She walks her dog every day.  Current Outpatient Medications on File Prior to Visit  Medication Sig Dispense Refill  . carboxymethylcellulose (REFRESH PLUS) 0.5 % SOLN Place 1 drop into both eyes 3 (three) times daily as needed.     . Cholecalciferol (VITAMIN D3) 5000 units TABS Take 1 tablet by mouth every morning.    . diltiazem (CARDIZEM CD) 180 MG 24 hr capsule Take 1 capsule (180 mg total) by mouth daily. 30 capsule 3  . diphenhydramine-acetaminophen (TYLENOL PM) 25-500 MG TABS tablet Take 2 tablets by mouth at bedtime.     Marland Kitchen ELIQUIS 5 MG TABS tablet TAKE ONE TABLET BY MOUTH TWICE DAILY 180 tablet 1  . insulin NPH-regular Human (NOVOLIN 70/30) (70-30) 100 UNIT/ML injection Inject into the skin. Inject 16 units in the morning and inject 20 units in the evening    . Multiple Vitamins-Minerals (ICAPS AREDS 2) CAPS Take 1 capsule by mouth 2 (two) times daily.    . nitroGLYCERIN (NITROSTAT) 0.4 MG SL tablet Place 0.4 mg under the tongue every 5 (five) minutes as needed for chest pain.    . pantoprazole (PROTONIX) 40 MG tablet Take 1 tablet (40 mg total) by mouth daily. 90 tablet 3  . potassium chloride (KLOR-CON) 8 MEQ tablet Take 2 tablets (16 meq) in the morning & 1 tablet (8 meq) in the evening 270 tablet 3  . rosuvastatin (CRESTOR) 40 MG tablet TAKE 1 TABLET BY MOUTH ONCE DAILY 90 tablet 3  . torsemide (DEMADEX) 20 MG tablet Take 2 tablets (40 mg total) by mouth daily. 60 tablet 0  . vitamin B-12 (CYANOCOBALAMIN) 500 MCG tablet Take 500 mcg by mouth daily.     No current facility-administered medications on file prior to visit.    Allergies  Allergen Reactions  . Demerol [Meperidine] Anaphylaxis    Tolerated Fentanyl 11/10/15  . Multaq [Dronedarone] Diarrhea  . Sulfa Antibiotics Anaphylaxis  . Tetracyclines & Related Other (See Comments) and Swelling    Made nose, lips,  And tongue itchy Made nose, lips,  And tongue itchy   Family History  Problem Relation Age of Onset  . Thyroid disease Mother 46       3 days after surgery  . Heart defect Father 21       ?ascending aortic aneurysm  . Breast cancer Sister  31       half sister  Also: -Diabetes in maternal grandparents -Hypertension and  uncle-hyperlipidemia in the whole family -Heart disease in father, mother, uncle-thyroid problems in mother and sister-cancer in son, grandmother, half sister  PE: BP 130/80   Pulse 75   Ht 5\' 3"  (1.6 m) Comment: measured  Wt 218 lb (98.9 kg)   SpO2 98%   BMI 38.62 kg/m  Wt Readings from Last 3 Encounters:  02/05/18 218 lb (98.9 kg)  01/09/18 215 lb 3.2 oz (97.6 kg)  12/07/17 216 lb 12 oz (98.3 kg)   Constitutional: overweight, in NAD Eyes: PERRLA, EOMI, no exophthalmos, + mild proptosis ENT: moist mucous membranes, no thyromegaly, no cervical lymphadenopathy Cardiovascular: RRR, No MRG Respiratory: CTA B Gastrointestinal: abdomen soft, NT, ND, BS+ Musculoskeletal: no deformities, strength intact in all 4 Skin: moist, warm, no rashes Neurological: no tremor with outstretched hands, DTR normal in all 4  ASSESSMENT: 1. Postablative Hypothyroidism  PLAN:  1. Patient with very long-standing uncontrolled hypothyroidism, developed after RAI treatment.  She is on levothyroxine, with long-standing suppressed TSH.  We reviewed together the physiology of the pituitary-thyroid axis with positive and negative feedback from the thyroid hormones.  We also discussed about different formulations of thyroid medications to include desiccated thyroid extract and brand-name Synthroid.  I explained benefits and shortcomings of each. - we discussed with patient that her daughter about possible effects of over replacement with thyroid hormones to include cardiac arrhythmia including atrial fibrillation (she does have a history of this), CHF (she also has a history of this), hypercoagulability, and in the long-term, osteoporosis and increased mortality.  She is very afraid that she will start being able more tired when we try to reduce the doses.  We discussed that we absolutely need to bring her TFTs to normal range to avoid the above complications.  However, we have to do this very slowly, since her body  has gotten used to higher doses of levothyroxine.  We will start by having her alternate levothyroxine 137 with 150 mcg every other day.   I would love to bring her TFTs to normal as soon as possible, however, in the long run it is more important to to do this slowly so her body can get used to the new dose before we decrease it further.  She agrees with this plan. - she appears euthyroid, but has multiple complaints including fatigue, weight gain, constipation, hair thinning.  We discussed that these may not be related to her thyroid. - she does not appear to havea goiter, thyroid nodules, or neck compression symptoms.  She does have occasional hoarseness, but I do not think this is related to thyroid nodularity. - We discussed about correct intake of levothyroxine, fasting, with water, separated by at least 30 minutes from breakfast, and separated by more than 4 hours from calcium, iron, multivitamins, acid reflux medications (PPIs).  She is taking it correctly. - will check thyroid tests today: TSH, free T4, free T3 - If labs today are abnormal, we will recheck them at next visit Patient Instructions  Please stop at the lab.  Please reduce the Levothyroxine to 150 alternating with 137 every other day.  Take the thyroid hormone every day, with water, at least 30 minutes before breakfast, separated by at least 4 hours from: - acid reflux medications - calcium - iron - multivitamins  2. DM2 -insulin-dependent - Patient with long-standing, uncontrolled diabetes, on premixed insulin regimen, which became insufficient.  Latest HbA1c was reviewed and this was 8.4% 3 months ago.  At today's visit, we repeated her HbA1c and this was stable, higher than target, at 8.4%. - We reviewed her meter downloads.  Her sugars are mostly higher than target at both checks: A.m., fasting and at bedtime.  We decided to continue the premixed insulin regimen but I advised her to take the doses 30 minutes before a meal  (since she is on Novolin 70/30).  She was taking the second dose at bedtime but she moved it at the start of the meal as her sugar started to increase.  However, we discussed that the doses should ideally be taken 30 minutes before the meals.  I also advised her to increase both doses by 25%. - I suggested to:  Patient Instructions  Please increase: Novolin 70/30 to: - 20 units in am before b'fast - 24 units before dinner  Please come back for labs in 1.5 months.  - Strongly advised her to start checking sugars at different times of the day - check 2x a day, rotating checks - discussed about CBG targets for treatment: 80-130 mg/dL before meals and <180 mg/dL after meals; target HbA1c <7%. - given sugar log and advised how to fill it and to bring it at next appt  - given foot care handout and explained the principles  - given instructions for hypoglycemia management "15-15 rule"  - Return to clinic in 1.5 mo with sugar log   3. H/o hyperparathyroidism -Now s/p 3/4 parathyroidectomy -Reviewed calcium levels and these were normal -She had one episode last night in which she felt that maybe her calcium was low -We will recheck an ionized calcium now  - time spent with the patient: 1 hour, of which >50% was spent in obtaining information about her symptoms, reviewing her previous labs, evaluations, and treatments, counseling her about her condition (please see the discussed topics above), and developing a plan to further investigate and treat it; she had a number of questions which I addressed.  Office Visit on 02/05/2018  Component Date Value Ref Range Status  . TSH 02/05/2018 0.04* 0.35 - 4.50 uIU/mL Final  . Free T4 02/05/2018 1.40  0.60 - 1.60 ng/dL Final   Comment: Specimens from patients who are undergoing biotin therapy and /or ingesting biotin supplements may contain high levels of biotin.  The higher biotin concentration in these specimens interferes with this Free T4 assay.   Specimens that contain high levels  of biotin may cause false high results for this Free T4 assay.  Please interpret results in light of the total clinical presentation of the patient.    . T3, Free 02/05/2018 3.4  2.3 - 4.2 pg/mL Final  . Calcium, Ion 02/05/2018 4.58* 4.8 - 5.6 mg/dL Final  . Hemoglobin A1C 02/05/2018 8.4* 4.0 - 5.6 % Final   Message sent: Dear Ms. Padmanabhan, Your ionized calcium was a little low, so it is okay to take a tablet of calcium (Tums) if you have any more of the symptoms that you experience before our last visit.  I would like to recheck your calcium at next visit. The thyroid tests show a very low TSH and normal free T4 and free T3.  Again, the free T4 and free T3 are directly related to when you took your thyroid medication.  The TSH is what shows [place] how your hypothyroidism is controlled.  I would suggest to continue with the plan to alternate 150 with 137 mcg  of levothyroxine for now. Let's have you back for a repeat set of labs in 5 weeks, as we discussed. Sincerely, Philemon Kingdom MD  Component     Latest Ref Rng & Units 02/05/2018  TSI     <140 % baseline 102   TSIs normal.  Philemon Kingdom, MD PhD [facility]

## 2018-02-05 NOTE — Patient Instructions (Addendum)
Please stop at the lab.  Please reduce the Levothyroxine to 150 alternating with 137 every other day.  Take the thyroid hormone every day, with water, at least 30 minutes before breakfast, separated by at least 4 hours from: - acid reflux medications - calcium - iron - multivitamins  Please increase: Novolin 70/30 to: - 20 units in am before b'fast - 24 units before dinner  Please come back for labs in 1.5 months.  PATIENT INSTRUCTIONS FOR TYPE 2 DIABETES:  **Please join MyChart!** - see attached instructions about how to join if you have not done so already.  DIET AND EXERCISE Diet and exercise is an important part of diabetic treatment.  We recommended aerobic exercise in the form of brisk walking (working between 40-60% of maximal aerobic capacity, similar to brisk walking) for 150 minutes per week (such as 30 minutes five days per week) along with 3 times per week performing 'resistance' training (using various gauge rubber tubes with handles) 5-10 exercises involving the major muscle groups (upper body, lower body and core) performing 10-15 repetitions (or near fatigue) each exercise. Start at half the above goal but build slowly to reach the above goals. If limited by weight, joint pain, or disability, we recommend daily walking in a swimming pool with water up to waist to reduce pressure from joints while allow for adequate exercise.    BLOOD GLUCOSES Monitoring your blood glucoses is important for continued management of your diabetes. Please check your blood glucoses 2-4 times a day: fasting, before meals and at bedtime (you can rotate these measurements - e.g. one day check before the 3 meals, the next day check before 2 of the meals and before bedtime, etc.).   HYPOGLYCEMIA (low blood sugar) Hypoglycemia is usually a reaction to not eating, exercising, or taking too much insulin/ other diabetes drugs.  Symptoms include tremors, sweating, hunger, confusion, headache,  etc. Treat IMMEDIATELY with 15 grams of Carbs: . 4 glucose tablets .  cup regular juice/soda . 2 tablespoons raisins . 4 teaspoons sugar . 1 tablespoon honey Recheck blood glucose in 15 mins and repeat above if still symptomatic/blood glucose <100.  RECOMMENDATIONS TO REDUCE YOUR RISK OF DIABETIC COMPLICATIONS: * Take your prescribed MEDICATION(S) * Follow a DIABETIC diet: Complex carbs, fiber rich foods, (monounsaturated and polyunsaturated) fats * AVOID saturated/trans fats, high fat foods, >2,300 mg salt per day. * EXERCISE at least 5 times a week for 30 minutes or preferably daily.  * DO NOT SMOKE OR DRINK more than 1 drink a day. * Check your FEET every day. Do not wear tightfitting shoes. Contact us if you develop an ulcer * See your EYE doctor once a year or more if needed * Get a FLU shot once a year * Get a PNEUMONIA vaccine once before and once after age 26 years  GOALS:  * Your Hemoglobin A1c of <7%  * fasting sugars need to be <130 * after meals sugars need to be <180 (2h after you start eating) * Your Systolic BP should be 532 or lower  * Your Diastolic BP should be 80 or lower  * Your HDL (Good Cholesterol) should be 40 or higher  * Your LDL (Bad Cholesterol) should be 100 or lower. * Your Triglycerides should be 150 or lower  * Your Urine microalbumin (kidney function) should be <30 * Your Body Mass Index should be 25 or lower    Please consider the following ways to cut down carbs and fat and  increase fiber and micronutrients in your diet: - substitute whole grain for white bread or pasta - substitute brown rice for white rice - substitute 90-calorie flat bread pieces for slices of bread when possible - substitute sweet potatoes or yams for white potatoes - substitute humus for margarine - substitute tofu for cheese when possible - substitute almond or rice milk for regular milk (would not drink soy milk daily due to concern for soy estrogen influence on  breast cancer risk) - substitute dark chocolate for other sweets when possible - substitute water - can add lemon or orange slices for taste - for diet sodas (artificial sweeteners will trick your body that you can eat sweets without getting calories and will lead you to overeating and weight gain in the long run) - do not skip breakfast or other meals (this will slow down the metabolism and will result in more weight gain over time)  - can try smoothies made from fruit and almond/rice milk in am instead of regular breakfast - can also try old-fashioned (not instant) oatmeal made with almond/rice milk in am - order the dressing on the side when eating salad at a restaurant (pour less than half of the dressing on the salad) - eat as little meat as possible - can try juicing, but should not forget that juicing will get rid of the fiber, so would alternate with eating raw veg./fruits or drinking smoothies - use as little oil as possible, even when using olive oil - can dress a salad with a mix of balsamic vinegar and lemon juice, for e.g. - use agave nectar, stevia sugar, or regular sugar rather than artificial sweateners - steam or broil/roast veggies  - snack on veggies/fruit/nuts (unsalted, preferably) when possible, rather than processed foods - reduce or eliminate aspartame in diet (it is in diet sodas, chewing gum, etc) Read the labels!  Try to read Dr. Janene Harvey book: "Program for Reversing Diabetes" for other ideas for healthy eating.

## 2018-02-06 ENCOUNTER — Other Ambulatory Visit: Payer: Self-pay | Admitting: Cardiology

## 2018-02-06 DIAGNOSIS — M19041 Primary osteoarthritis, right hand: Secondary | ICD-10-CM | POA: Diagnosis not present

## 2018-02-06 DIAGNOSIS — M180 Bilateral primary osteoarthritis of first carpometacarpal joints: Secondary | ICD-10-CM | POA: Diagnosis not present

## 2018-02-06 DIAGNOSIS — M19042 Primary osteoarthritis, left hand: Secondary | ICD-10-CM | POA: Diagnosis not present

## 2018-02-10 LAB — CALCIUM, IONIZED: CALCIUM ION: 4.58 mg/dL — AB (ref 4.8–5.6)

## 2018-02-10 LAB — THYROID STIMULATING IMMUNOGLOBULIN: TSI: 102 % baseline (ref <140)

## 2018-02-13 ENCOUNTER — Other Ambulatory Visit: Payer: Self-pay

## 2018-02-13 MED ORDER — INSULIN NPH ISOPHANE & REGULAR (70-30) 100 UNIT/ML ~~LOC~~ SUSP: SUBCUTANEOUS | 2 refills | Status: DC

## 2018-02-27 ENCOUNTER — Telehealth: Payer: Self-pay | Admitting: Internal Medicine

## 2018-02-27 ENCOUNTER — Other Ambulatory Visit: Payer: Self-pay

## 2018-02-27 MED ORDER — "INSULIN SYRINGE 31G X 5/16"" 0.3 ML MISC": 3 refills | Status: AC

## 2018-02-27 NOTE — Telephone Encounter (Signed)
This has been sent

## 2018-02-27 NOTE — Telephone Encounter (Signed)
MEDICATION: 31 gauge needles  PHARMACY:  [facility] on [address]  IS THIS A 90 DAY SUPPLY : 30 day (patient request a box)  IS PATIENT OUT OF MEDICATION: no  IF NOT; HOW MUCH IS LEFT: 3 days worth  LAST APPOINTMENT DATE: @12 /05/2017  NEXT APPOINTMENT DATE:@1 /14/2020  DO WE HAVE YOUR PERMISSION TO LEAVE A DETAILED MESSAGE:  OTHER COMMENTS:    **Let patient know to contact pharmacy at the end of the day to make sure medication is ready. **  ** Please notify patient to allow 48-72 hours to process**  **Encourage patient to contact the pharmacy for refills or they can request refills through [facility]**

## 2018-03-02 LAB — CUP PACEART REMOTE DEVICE CHECK
Brady Statistic AP VP Percent: 35 %
Brady Statistic AS VP Percent: 1 %
Brady Statistic RA Percent Paced: 62 %
Brady Statistic RV Percent Paced: 37 %
Implantable Lead Implant Date: 20170829
Lead Channel Impedance Value: 560 Ohm
Lead Channel Pacing Threshold Pulse Width: 0.4 ms
Lead Channel Pacing Threshold Pulse Width: 0.4 ms
Lead Channel Sensing Intrinsic Amplitude: 12 mV
Lead Channel Setting Pacing Amplitude: 1.125
Lead Channel Setting Pacing Amplitude: 2 V
Lead Channel Setting Sensing Sensitivity: 2 mV
MDC IDC LEAD IMPLANT DT: 20170829
MDC IDC LEAD LOCATION: 753859
MDC IDC LEAD LOCATION: 753860
MDC IDC MSMT BATTERY REMAINING LONGEVITY: 126 mo
MDC IDC MSMT BATTERY REMAINING PERCENTAGE: 95.5 %
MDC IDC MSMT BATTERY VOLTAGE: 3.01 V
MDC IDC MSMT LEADCHNL RA IMPEDANCE VALUE: 430 Ohm
MDC IDC MSMT LEADCHNL RA PACING THRESHOLD AMPLITUDE: 1 V
MDC IDC MSMT LEADCHNL RA SENSING INTR AMPL: 4.2 mV
MDC IDC MSMT LEADCHNL RV PACING THRESHOLD AMPLITUDE: 0.875 V
MDC IDC PG IMPLANT DT: 20170829
MDC IDC PG SERIAL: 7945290
MDC IDC SESS DTM: 20191008060510
MDC IDC SET LEADCHNL RV PACING PULSEWIDTH: 0.4 ms
MDC IDC STAT BRADY AP VS PERCENT: 30 %
MDC IDC STAT BRADY AS VS PERCENT: 34 %
Pulse Gen Model: 2272

## 2018-03-05 ENCOUNTER — Other Ambulatory Visit: Payer: Self-pay | Admitting: Internal Medicine

## 2018-03-05 ENCOUNTER — Other Ambulatory Visit: Payer: Self-pay | Admitting: Cardiovascular Disease

## 2018-03-06 NOTE — Telephone Encounter (Signed)
Please review for refill.  

## 2018-03-06 NOTE — Telephone Encounter (Signed)
This is a [place] pt 

## 2018-03-12 DIAGNOSIS — J209 Acute bronchitis, unspecified: Secondary | ICD-10-CM | POA: Diagnosis not present

## 2018-03-14 DIAGNOSIS — I951 Orthostatic hypotension: Secondary | ICD-10-CM

## 2018-03-14 HISTORY — DX: Orthostatic hypotension: I95.1

## 2018-03-15 DIAGNOSIS — E1122 Type 2 diabetes mellitus with diabetic chronic kidney disease: Secondary | ICD-10-CM | POA: Diagnosis not present

## 2018-03-15 DIAGNOSIS — J018 Other acute sinusitis: Secondary | ICD-10-CM | POA: Diagnosis not present

## 2018-03-15 DIAGNOSIS — J069 Acute upper respiratory infection, unspecified: Secondary | ICD-10-CM | POA: Diagnosis not present

## 2018-03-15 DIAGNOSIS — J029 Acute pharyngitis, unspecified: Secondary | ICD-10-CM | POA: Diagnosis not present

## 2018-03-15 DIAGNOSIS — I10 Essential (primary) hypertension: Secondary | ICD-10-CM | POA: Diagnosis not present

## 2018-03-20 ENCOUNTER — Ambulatory Visit (INDEPENDENT_AMBULATORY_CARE_PROVIDER_SITE_OTHER): Payer: Medicare Other

## 2018-03-20 DIAGNOSIS — I442 Atrioventricular block, complete: Secondary | ICD-10-CM | POA: Diagnosis not present

## 2018-03-21 LAB — CUP PACEART REMOTE DEVICE CHECK
Battery Remaining Longevity: 124 mo
Battery Remaining Percentage: 95.5 %
Brady Statistic AP VP Percent: 44 %
Brady Statistic AP VS Percent: 13 %
Brady Statistic AS VP Percent: 3.2 %
Brady Statistic AS VS Percent: 40 %
Brady Statistic RA Percent Paced: 53 %
Brady Statistic RV Percent Paced: 46 %
Date Time Interrogation Session: 20200108172536
Implantable Lead Implant Date: 20170829
Implantable Lead Implant Date: 20170829
Implantable Lead Location: 753859
Implantable Lead Location: 753860
Implantable Pulse Generator Implant Date: 20170829
Lead Channel Impedance Value: 440 Ohm
Lead Channel Impedance Value: 600 Ohm
Lead Channel Pacing Threshold Amplitude: 0.875 V
Lead Channel Pacing Threshold Pulse Width: 0.4 ms
Lead Channel Pacing Threshold Pulse Width: 0.4 ms
Lead Channel Sensing Intrinsic Amplitude: 12 mV
Lead Channel Sensing Intrinsic Amplitude: 5 mV
Lead Channel Setting Pacing Amplitude: 2.125
Lead Channel Setting Pacing Pulse Width: 0.4 ms
Lead Channel Setting Sensing Sensitivity: 2 mV
MDC IDC MSMT BATTERY VOLTAGE: 3.01 V
MDC IDC MSMT LEADCHNL RA PACING THRESHOLD AMPLITUDE: 1.125 V
MDC IDC SET LEADCHNL RV PACING AMPLITUDE: 1.125
Pulse Gen Model: 2272
Pulse Gen Serial Number: 7945290

## 2018-03-22 NOTE — Progress Notes (Signed)
Remote pacemaker transmission.   

## 2018-03-23 DIAGNOSIS — R109 Unspecified abdominal pain: Secondary | ICD-10-CM | POA: Diagnosis not present

## 2018-03-23 DIAGNOSIS — E1122 Type 2 diabetes mellitus with diabetic chronic kidney disease: Secondary | ICD-10-CM | POA: Diagnosis not present

## 2018-03-23 DIAGNOSIS — R42 Dizziness and giddiness: Secondary | ICD-10-CM | POA: Diagnosis not present

## 2018-03-23 DIAGNOSIS — E860 Dehydration: Secondary | ICD-10-CM | POA: Diagnosis not present

## 2018-03-23 DIAGNOSIS — R110 Nausea: Secondary | ICD-10-CM | POA: Diagnosis not present

## 2018-03-23 DIAGNOSIS — R531 Weakness: Secondary | ICD-10-CM | POA: Diagnosis not present

## 2018-03-27 ENCOUNTER — Encounter: Payer: Self-pay | Admitting: Internal Medicine

## 2018-03-27 ENCOUNTER — Ambulatory Visit (INDEPENDENT_AMBULATORY_CARE_PROVIDER_SITE_OTHER): Payer: Medicare Other | Admitting: Internal Medicine

## 2018-03-27 VITALS — BP 130/80 | HR 82 | Ht 63.0 in | Wt 212.0 lb

## 2018-03-27 DIAGNOSIS — E1165 Type 2 diabetes mellitus with hyperglycemia: Secondary | ICD-10-CM | POA: Diagnosis not present

## 2018-03-27 DIAGNOSIS — E890 Postprocedural hypothyroidism: Secondary | ICD-10-CM | POA: Diagnosis not present

## 2018-03-27 DIAGNOSIS — E1159 Type 2 diabetes mellitus with other circulatory complications: Secondary | ICD-10-CM | POA: Diagnosis not present

## 2018-03-27 DIAGNOSIS — E213 Hyperparathyroidism, unspecified: Secondary | ICD-10-CM | POA: Diagnosis not present

## 2018-03-27 LAB — T4, FREE: Free T4: 1.55 ng/dL (ref 0.60–1.60)

## 2018-03-27 LAB — VITAMIN D 25 HYDROXY (VIT D DEFICIENCY, FRACTURES): VITD: 80.87 ng/mL (ref 30.00–100.00)

## 2018-03-27 LAB — TSH: TSH: 0.34 u[IU]/mL — ABNORMAL LOW (ref 0.35–4.50)

## 2018-03-27 MED ORDER — INSULIN NPH ISOPHANE & REGULAR (70-30) 100 UNIT/ML ~~LOC~~ SUSP: SUBCUTANEOUS | 2 refills | Status: DC

## 2018-03-27 NOTE — Patient Instructions (Addendum)
Please Increase: Novolin 70/30: - 28 units in am before b'fast - 24 units before dinner  Please stop at the lab.  Please continue to alternate Levothyroxine 150 alternating with 137 mcg every other day.  Take the thyroid hormone every day, with water, at least 30 minutes before breakfast, separated by at least 4 hours from: - acid reflux medications - calcium - iron - multivitamins  Please come back for labs in 3 months.

## 2018-03-27 NOTE — Progress Notes (Signed)
Patient ID: Dawn Garcia, female   DOB: May 01, 1940, 78 y.o.   MRN: [id number]    HPI  Dawn Garcia is a 78 y.o.-year-old female, referred by her PCP, Dr. Humphrey Rolls, for management of uncontrolled post ablative hypothyroidism and DM2, insulin-dependent since 2013, with long-term complications (atrial fibrillation, CHF, CKD).    She has a URI.  On Levaquin.  Post ablative hypothyroidism: Pt. has been dx with Graves ds. At 78 y/o. She had RAI Tx in 1960 >> postablative hypothyroidism  >> started on levothyroxine.  Patient had consistently suppressed TSH levels over the years so we are now decreasing her doses very slowly due to fatigue.  At last visit I recommended to decrease the dose to 150 alternating with 137 mcg every other day.  Pt takes the levothyroxine - in am - fasting - with water - at least 30 min from b'fast - no Ca, Fe, MVI - + Protonix at night - not on Biotin anymore  Of note, she was on amiodarone in 03/2017 >> stopped 04/2017 b/c AKI.  Reviewed patient's TFTs: Lab Results  Component Value Date   TSH 0.04 (L) 02/05/2018   TSH 0.198 (L) 04/26/2017   TSH 0.174 (L) 04/18/2017   TSH 0.034 (L) 03/10/2016   TSH 0.050 (L) 12/08/2015   TSH 0.061 (L) 11/08/2015   TSH 0.079 (L) 09/09/2015   TSH 0.229 (L) 08/19/2015   FREET4 1.40 02/05/2018   FREET4 3.54 (H) 04/26/2017   FREET4 2.08 (H) 11/08/2015   FREET4 2.18 (H) 09/09/2015   FREET4 0.93 08/19/2015   T3FREE 3.4 02/05/2018   T3FREE 1.8 (L) 04/26/2017   T3FREE 3.0 11/08/2015   T3FREE 2.8 09/09/2015   T3FREE 3.1 08/19/2015  10/25/2017: TSH 0.021 (0.45-5.33), free T4 1.7 (0.66-1.14)  Graves' antibodies were not elevated: Lab Results  Component Value Date   TSI 102 02/05/2018    No results found for: THGAB No components found for: TPOAB  She continues to describe: weight gain Fatigue No heat or cold intolerance No anxiety or depression Diarrhea/constipation Hair thinning Headaches Generalized joint  pain and swelling  Pt denies: - feeling nodules in neck - hoarseness - dysphagia - choking - SOB with lying down  She has + FH of thyroid disorders in: mother (died after sx for toxic goiter) and sister (goiter).  No family history of thyroid cancer. No h/o radiation tx to head or neck except RAI tx and eye RAI Tx. No herbal supplements.  No extra iodine supplements.  She had GO >> had increased IO pressure and did not respond to IV steroids >> she had radiation therapy and then eyelid surgery.  DM2: -Started insulin 2013  Reviewed HbA1c levels: Lab Results  Component Value Date   HGBA1C 8.4 (A) 02/05/2018   HGBA1C 9.4 (H) 04/28/2017   HGBA1C 7.8 (H) 11/29/2016   HGBA1C 8.1 (H) 09/05/2016   HGBA1C 8.2 (H) 05/02/2016   HGBA1C 7.5 (H) 03/10/2016   HGBA1C 7.8 (H) 12/08/2015   HGBA1C 6.9 (H) 08/19/2015  10/25/2017: HbA1c 8.4%  Last visit she was on: Novolin insulin 70/30: - 16 units (and if CBG in the am >130), add 20 units at bedtime Tried Jardiance >> diarrhea.  We changed to: Novolin 70/30: - 20 units in am before b'fast - 24 units before dinner  She checks CBGs twice a day: - am: 124, 130-168, 180, 196 >> 110-160, 175, 206 - lunch: 201 - 2h after lunch: 149 - before dinner: 141-328 (sick) - at bedtime: 137-205, 265 >>  269-485  She has a history of CKD-latest kidney function: 10/25/2017: BUN/creatinine: 17/1.4, GFR 37 Lab Results  Component Value Date   BUN 22 09/13/2017   BUN 20 05/19/2017   Lab Results  Component Value Date   CREATININE 1.36 (H) 09/13/2017   CREATININE 1.39 (H) 05/19/2017   She had last eye exam  - 01/2018 - gets IO inj's.  She has a history of hyperparathyroidism: - h/o first sx: 1978 (2 glands excised) - h/o second sx: 2010 (1 gland excised - 3.2 g)  She is only taking Tums as needed.  Reviewed calcium levels: Component     Latest Ref Rng & Units 02/05/2018  Calcium Ionized     4.8 - 5.6 mg/dL 4.58 (L)   Lab Results   Component Value Date   CALCIUM 9.1 09/13/2017   CALCIUM 9.1 05/19/2017   CALCIUM 8.9 05/16/2017   CALCIUM 9.0 05/04/2017   CALCIUM 9.0 05/03/2017   Vit D: Lab Results  Component Value Date   VD25OH 59.0 08/19/2015   She also has a history of OSA-on CPAP, osteoarthritis, history of melanoma, macular degeneration.  ROS: Constitutional: no weight gain/no weight loss, no fatigue, no subjective hyperthermia, no subjective hypothermia Eyes: no blurry vision, no xerophthalmia ENT: no sore throat, + see HPI Cardiovascular: no CP/no SOB/no palpitations/no leg swelling Respiratory: no cough/no SOB/no wheezing Gastrointestinal: no N/no V/no D/no C/no acid reflux Musculoskeletal: no muscle aches/no joint aches Skin: no rashes, no hair loss Neurological: no tremors/no numbness/no tingling/no dizziness  I reviewed pt's medications, allergies, PMH, social hx, family hx, and changes were documented in the history of present illness. Otherwise, unchanged from my initial visit note.  Past Medical History:  Diagnosis Date  . Age-related macular degeneration, dry, right eye   . Age-related macular degeneration, wet, left eye ([place])   . Arthritis    "all over; particularly in hands/fingers; all my joints" (10/05/2017)  . Atrial fibrillation ([place])   . CAD (coronary artery disease)   . CHF (congestive heart failure) ([facility])     A-Fib  . Chronic kidney disease   . COPD (chronic obstructive pulmonary disease) ([place])    "very mild" (10/05/2017)  . Family history of adverse reaction to anesthesia    "daughter w/PONV & woke up during endoscopy" (10/05/2017)  . GERD (gastroesophageal reflux disease)   . Graves' disease    S/P "radioactive tx"  . History of blood transfusion    "related to shoulder replacement"   . History of gout    "no longer on daily RX" (10/05/2017)  . History of hiatal hernia   . Hyperlipidemia   . Hypertension   . Hypocalcemia   . Hypokalemia   . Hypothyroidism   . Macular  degeneration   . Melanoma ([place]) ~ 2012   "off my back" (10/05/2017)  . Myocardial infarction [facility])    "was told I'd had one; don't really know when" (10/05/2017)  . OSA on CPAP   . 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 Sinus node dysfunction ([facility])   . Type II diabetes mellitus ([place])   . Vitamin B12 deficiency   . Vitamin D deficiency    Past Surgical History:  Procedure Laterality Date  . APPENDECTOMY    . ATRIAL FIBRILLATION ABLATION N/A 10/05/2017   Procedure: ATRIAL FIBRILLATION ABLATION;  Surgeon: Constance Haw, MD;  Location: [place] [facility];  Service: Cardiovascular;  Laterality: N/A;  .  ATRIAL FIBRILLATION ABLATION  ~ 2014  . CARDIAC CATHETERIZATION  04/2017  . COLONOSCOPY W/ BIOPSIES AND POLYPECTOMY  2015   1 polyp removed- repeat 3 years  . CORONARY ANGIOPLASTY WITH STENT PLACEMENT  ~ 2014  . EP IMPLANTABLE DEVICE N/A 11/10/2015   Procedure: Pacemaker Implant;  Surgeon: Will [name], MD;  Location: [place];  Service: Cardiovascular;  Laterality: N/A;  . EP IMPLANTABLE DEVICE N/A 11/11/2015   Procedure: Lead Revision/Repair;  Surgeon: Evans Lance, MD;  Location: [place] [facility];  Service: Cardiovascular;  Laterality: N/A;  . INGUINAL HERNIA REPAIR Left 1951  . JOINT REPLACEMENT    . LAPAROSCOPIC CHOLECYSTECTOMY    . MELANOMA EXCISION  ~ 2012   "off my back" (10/05/2017)  . PARATHYROIDECTOMY     "removed 3 glands; still have 1 gland" (10/05/2017)  . RIGHT/LEFT HEART CATH AND CORONARY ANGIOGRAPHY N/A 05/03/2017   Procedure: RIGHT/LEFT HEART CATH AND CORONARY ANGIOGRAPHY;  Surgeon: Leonie Man, MD;  Location: [facility];  Service: Cardiovascular;  Laterality: N/A;  . TONSILLECTOMY    . TOTAL SHOULDER ARTHROPLASTY Right x 2  . VAGINAL HYSTERECTOMY     "total"   Social History   Socioeconomic History  . Marital status: Married    Spouse name: Not on file   . Number of children: 3  . Years of education: Not on file  . Highest education level: Not on file  Occupational History  . Occupation: Retired Animal nutritionist  . Financial resource strain: Not on file  . Food insecurity:    Worry: Not on file    Inability: Not on file  . Transportation needs:    Medical: Not on file    Non-medical: Not on file  Tobacco Use  . Smoking status: Former Smoker, quit in 1967    Packs/day: 2.00    Years: 3.00    Pack years: 6.00    Types: Cigarettes  . Smokeless tobacco: Never Used  . Tobacco comment: 10/05/2017 "smoked in my 20's"  Substance and Sexual Activity  . Alcohol use: Yes    Comment: 1-2 mixed drinks per month  . Drug use: Never  Lifestyle  Relationships  Social History Narrative   Lives in [place] w/ husband.  She walks her dog every day.  Current Outpatient Medications on File Prior to Visit  Medication Sig Dispense Refill  . carboxymethylcellulose (REFRESH PLUS) 0.5 % SOLN Place 1 drop into both eyes 3 (three) times daily as needed.    . Cholecalciferol (VITAMIN D3) 5000 units TABS Take 1 tablet by mouth every morning.    . diltiazem (CARDIZEM CD) 180 MG 24 hr capsule TAKE 1 CAPSULE EVERY DAY 30 capsule 11  . diphenhydramine-acetaminophen (TYLENOL PM) 25-500 MG TABS tablet Take 2 tablets by mouth at bedtime.     Marland Kitchen ELIQUIS 5 MG TABS tablet TAKE ONE TABLET BY MOUTH TWICE DAILY 180 tablet 1  . insulin NPH-regular Human (70-30) 100 UNIT/ML injection Inject 20 units in the morning before breakfast and inject 24 units in the evening before dinner 10 mL 2  . Insulin Syringe-Needle U-100 (INSULIN SYRINGE .3CC/31GX5/16") 31G X 5/16" 0.3 ML MISC Use to inject insulin twice daily 200 each 3  . levothyroxine (SYNTHROID, LEVOTHROID) 137 MCG tablet Take 1 tablet (137 mcg total) by mouth every other day. 30 tablet 3  . levothyroxine (SYNTHROID, LEVOTHROID) 150 MCG tablet Take 1 tablet (150 mcg total) by mouth every other day. 30 tablet  3  .  Multiple Vitamins-Minerals (ICAPS AREDS 2) CAPS Take 1 capsule by mouth 2 (two) times daily.    . nitroGLYCERIN (NITROSTAT) 0.4 MG SL tablet Place 0.4 mg under the tongue every 5 (five) minutes as needed for chest pain.    . pantoprazole (PROTONIX) 40 MG tablet Take 1 tablet (40 mg total) by mouth daily. 90 tablet 3  . potassium chloride (KLOR-CON) 8 MEQ tablet Take 2 tablets (16 meq) in the morning & 1 tablet (8 meq) in the evening 270 tablet 3  . rosuvastatin (CRESTOR) 40 MG tablet TAKE ONE TABLET BY MOUTH EVERY DAY 90 tablet 1  . torsemide (DEMADEX) 20 MG tablet TAKE TWO TABLETS BY MOUTH EVERY DAY 180 tablet 2  . vitamin B-12 (CYANOCOBALAMIN) 500 MCG tablet Take 500 mcg by mouth daily.     No current facility-administered medications on file prior to visit.    Allergies  Allergen Reactions  . Demerol [Meperidine] Anaphylaxis    Tolerated Fentanyl 11/10/15  . Multaq [Dronedarone] Diarrhea  . Sulfa Antibiotics Anaphylaxis  . Tetracyclines & Related Other (See Comments) and Swelling    Made nose, lips,  And tongue itchy Made nose, lips,  And tongue itchy   Family History  Problem Relation Age of Onset  . Thyroid disease Mother 38       3 days after surgery  . Heart defect Father 69       ?ascending aortic aneurysm  . Breast cancer Sister 33       half sister  Also: -Diabetes in maternal grandparents -Hypertension and uncle-hyperlipidemia in the whole family -Heart disease in father, mother, uncle-thyroid problems in mother and sister-cancer in son, grandmother, half sister  PE: BP 130/80   Pulse 82   Ht 5\' 3"  (1.6 m)   Wt 212 lb (96.2 kg)   SpO2 98%   BMI 37.55 kg/m  Wt Readings from Last 3 Encounters:  03/27/18 212 lb (96.2 kg)  02/05/18 218 lb (98.9 kg)  01/09/18 215 lb 3.2 oz (97.6 kg)   Constitutional: overweight, in NAD Eyes: PERRLA, EOMI,+ mild proptosis ENT: moist mucous membranes, no thyromegaly, no cervical lymphadenopathy Cardiovascular: RRR, No  MRG Respiratory: CTA B Gastrointestinal: abdomen soft, NT, ND, BS+ Musculoskeletal: no deformities, strength intact in all 4 Skin: moist, warm, no rashes Neurological: + mild tremor with outstretched hands, DTR normal in all 4  ASSESSMENT: 1. Postablative Hypothyroidism  PLAN:  1. Patient with very longstanding uncontrolled hypothyroidism, developed after RAI treatment.  She is on levothyroxine and her latest TSH was suppressed on 150 mcg daily.  We decreased the dose to 150 alternating with 137 mcg every other day.  She has a long history of suppressed TSH and we discussed about side effects of over replacement with thyroid hormones to include cardiac arrhythmia (she already has a history of this-atrial fibrillation), CHF (she also has a history of this), hypercoagulability (including stroke, heart attack, and pulmonary embolism) and in the long run osteoporosis and increased mortality.  At last visit she was telling me that she was very afraid of not feeling tired and we discussed that we will just need to decrease the doses as slowly as possible.  Therefore, we only decreased the dose to alternating 150 with 137 mcg every other day.  She continues this dose today.  She does not feel more tired. -She does continue to have multiple complaints including fatigue, weight gain, constipation, hair thinning.  We discussed at last visit that this may  not be related to her thyroid. - we discussed about taking the thyroid hormone every day, with water, >30 minutes before breakfast, separated by >4 hours from acid reflux medications, calcium, iron, multivitamins. Pt. is taking it correctly. - will check thyroid tests today: TSH and fT4 - If labs are abnormal, she will need to return for repeat TFTs in 1.5 months Patient Instructions  Please stop at the lab.  Please continue to alternate Levothyroxine 150 alternating with 137 mcg every other day.  Take the thyroid hormone every day, with water, at least  30 minutes before breakfast, separated by at least 4 hours from: - acid reflux medications - calcium - iron - multivitamins  2. DM2 -insulin-dependent - Patient with longstanding, uncontrolled, type 2 diabetes, on premixed insulin regimen, which was insufficient-HbA1c at last visit higher than target, at 8.4%.  We continue the premixed insulin regimen then but increased the doses. - at this visit, sugars are high, especially as the day goes by >> will increase am 70/30 insulin dose - at next visit, we can change the regimen to a more flexible  after the Holidays and also when she feels better - I suggested to:  Patient Instructions  Please Increase: Novolin 70/30: - 28 units in am before b'fast - 24 units before dinner  Please stop at the lab.  Please continue to alternate Levothyroxine 150 alternating with 137 mcg every other day.  Take the thyroid hormone every day, with water, at least 30 minutes before breakfast, separated by at least 4 hours from: - acid reflux medications - calcium - iron - multivitamins  Please come back for labs in 3 months.  - continue checking sugars at different times of the day - check 2-3x a day, rotating checks - advised for yearly eye exams >> she is UTD - Return to clinic in 3 mo with sugar log   3. H/o hyperparathyroidism -Now status post 3/4 parathyroidectomy -Reviewed ionized calcium level from last visit and this was slightly low. -I advised her that she can take as needed Tums, as she was doing before -I will recheck an ionized calcium and a vit D  now.  Needs refills LT4.  Component     Latest Ref Rng & Units 03/27/2018  TSH     0.35 - 4.50 uIU/mL 0.34 (L)  T4,Free(Direct)     0.60 - 1.60 ng/dL 1.55  Calcium Ionized     4.8 - 5.6 mg/dL 4.55 (L)  VITD     30.00 - 100.00 ng/mL 80.87   At this point, we can decrease the dose of levothyroxine to only 137 mcg daily.  We will recheck the test when she comes back.  Her ionized  calcium is still slightly low.  I will advised her to start taking 1 Tums tablet a day, making sure that she separated from levothyroxine by at least 4 hours.  Vitamin D level is normal.  Philemon Kingdom, MD PhD [facility] Endocrin[facility]

## 2018-03-28 ENCOUNTER — Encounter: Payer: Self-pay | Admitting: Internal Medicine

## 2018-03-28 LAB — CALCIUM, IONIZED: Calcium, Ion: 4.55 mg/dL — ABNORMAL LOW (ref 4.8–5.6)

## 2018-03-28 MED ORDER — LEVOTHYROXINE SODIUM 137 MCG PO TABS: 137.0000 ug | ORAL_TABLET | ORAL | 3 refills | Status: DC

## 2018-03-30 DIAGNOSIS — H35433 Paving stone degeneration of retina, bilateral: Secondary | ICD-10-CM | POA: Diagnosis not present

## 2018-03-30 DIAGNOSIS — H35033 Hypertensive retinopathy, bilateral: Secondary | ICD-10-CM | POA: Diagnosis not present

## 2018-03-30 DIAGNOSIS — H353221 Exudative age-related macular degeneration, left eye, with active choroidal neovascularization: Secondary | ICD-10-CM | POA: Diagnosis not present

## 2018-03-30 DIAGNOSIS — H353112 Nonexudative age-related macular degeneration, right eye, intermediate dry stage: Secondary | ICD-10-CM | POA: Diagnosis not present

## 2018-04-02 DIAGNOSIS — J449 Chronic obstructive pulmonary disease, unspecified: Secondary | ICD-10-CM | POA: Diagnosis not present

## 2018-04-03 ENCOUNTER — Ambulatory Visit (INDEPENDENT_AMBULATORY_CARE_PROVIDER_SITE_OTHER): Payer: Medicare Other | Admitting: Internal Medicine

## 2018-04-03 ENCOUNTER — Encounter: Payer: Self-pay | Admitting: Internal Medicine

## 2018-04-03 VITALS — BP 114/68 | Ht 63.0 in | Wt 216.0 lb

## 2018-04-03 DIAGNOSIS — Z79899 Other long term (current) drug therapy: Secondary | ICD-10-CM

## 2018-04-03 DIAGNOSIS — I495 Sick sinus syndrome: Secondary | ICD-10-CM | POA: Diagnosis not present

## 2018-04-03 DIAGNOSIS — I480 Paroxysmal atrial fibrillation: Secondary | ICD-10-CM

## 2018-04-03 DIAGNOSIS — Z950 Presence of cardiac pacemaker: Secondary | ICD-10-CM | POA: Diagnosis not present

## 2018-04-03 MED ORDER — TORSEMIDE 20 MG PO TABS: ORAL_TABLET | ORAL | Status: DC

## 2018-04-03 MED ORDER — TORSEMIDE 20 MG PO TABS: ORAL_TABLET | ORAL | 3 refills | Status: DC

## 2018-04-03 MED ORDER — POTASSIUM CHLORIDE ER 8 MEQ PO TBCR: EXTENDED_RELEASE_TABLET | ORAL | 3 refills | Status: DC

## 2018-04-03 MED ORDER — POTASSIUM CHLORIDE ER 8 MEQ PO TBCR: EXTENDED_RELEASE_TABLET | ORAL | Status: DC

## 2018-04-03 NOTE — Progress Notes (Signed)
ELECTROPHYSIOLOGY OFFICE  NOTE  Patient ID: Dawn Garcia, MRN: [id number], DOB/AGE: 04-13-1940 78 y.o. Admit date: (Not on file) Date of Consult: 04/03/2018  Primary Physician: Perrin Maltese, MD Primary Cardiologist: tg       HPI Dawn Garcia is a 78 y.o. female  Seen in followup for pacing by Dr. 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 This was complicated by right atrial lead dislodgment requiring revision.    She has a history of a  PVI 2013 and repeat7/19.     DATE TEST    6/17    Echo   EF 65 %   11/17    Myoview   EF 54 % Anteroapical defect  8/18 Echo  EF 50-55%  possible wall motion abnormality-reviewed by TG no further workup necessary  2/19 Cath    moderate mid-to distal LAD with patent circumflex stent//LVEDP 8//RA 11-notably patient was in sinus rhythm  2/19` Echo   EF 50-55%       Date Cr K TSH Hgb  12/17      12/18 1.19 3.9     1/19 1.69 3.1  16.4  2/19 1.69 4.3    7/19 1.36 3.9  12.6  1/20   0.34          She has been having some shortness of breath and abdominal swelling.  It is here that she accumulates her edema.  Does not really respond vigorously to torsemide 40.  She feels overall she is doing considerably better and thinks she is having less atrial fibrillation      Thromboembolic risk factors ( age  -2, DM-1, Vasc disease -1, CHF-1, Gender-1) for a CHADSVASc Score of 6      Past Medical History:  Diagnosis Date  . Age-related macular degeneration, dry, right eye   . Age-related macular degeneration, wet, left eye ([place])   . Arthritis    "all over; particularly in hands/fingers; all my joints" (10/05/2017)  . Atrial fibrillation ([place])   . CAD (coronary artery disease)   . CHF (congestive heart failure) (HCC)     A-Fib  . Chronic kidney disease   . COPD (chronic obstructive pulmonary disease) ([place])    "very mild" (10/05/2017)  . Family history of adverse reaction to anesthesia    "daughter w/PONV & woke up during  endoscopy" (10/05/2017)  . GERD (gastroesophageal reflux disease)   . Graves' disease    S/P "radioactive tx"  . History of blood transfusion    "related to shoulder replacement"   . History of gout    "no longer on daily RX" (10/05/2017)  . History of hiatal hernia   . Hyperlipidemia   . Hypertension   . Hypocalcemia   . Hypokalemia   . Hypothyroidism   . Macular degeneration   . Melanoma ([place]) ~ 2012   "off my back" (10/05/2017)  . Myocardial infarction [facility])    "was told I'd had one; don't really know when" (10/05/2017)  . OSA on CPAP   . 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 Sinus node dysfunction (HCC)   . Type II diabetes mellitus ([place])   . Vitamin B12 deficiency   . Vitamin D deficiency       Surgical History:  Past Surgical History:  Procedure Laterality Date  . APPENDECTOMY    . ATRIAL FIBRILLATION ABLATION N/A 10/05/2017   Procedure: ATRIAL FIBRILLATION ABLATION;  Surgeon: Curt Bears,  Ocie Doyne, MD;  Location: [place] [facility];  Service: Cardiovascular;  Laterality: N/A;  . ATRIAL FIBRILLATION ABLATION  ~ 2014  . CARDIAC CATHETERIZATION  04/2017  . COLONOSCOPY W/ BIOPSIES AND POLYPECTOMY  2015   1 polyp removed- repeat 3 years  . CORONARY ANGIOPLASTY WITH STENT PLACEMENT  ~ 2014  . EP IMPLANTABLE DEVICE N/A 11/10/2015   Procedure: Pacemaker Implant;  Surgeon: Will [name], MD;  Location: [place] [facility];  Service: Cardiovascular;  Laterality: N/A;  . EP IMPLANTABLE DEVICE N/A 11/11/2015   Procedure: Lead Revision/Repair;  Surgeon: Evans Lance, MD;  Location: [place] [facility];  Service: Cardiovascular;  Laterality: N/A;  . INGUINAL HERNIA REPAIR Left 1951  . JOINT REPLACEMENT    . LAPAROSCOPIC CHOLECYSTECTOMY    . MELANOMA EXCISION  ~ 2012   "off my back" (10/05/2017)  . PARATHYROIDECTOMY     "removed 3 glands; still have 1 gland" (10/05/2017)  . RIGHT/LEFT HEART CATH AND  CORONARY ANGIOGRAPHY N/A 05/03/2017   Procedure: RIGHT/LEFT HEART CATH AND CORONARY ANGIOGRAPHY;  Surgeon: Leonie Man, MD;  Location: [place] [facility];  Service: Cardiovascular;  Laterality: N/A;  . TONSILLECTOMY    . TOTAL SHOULDER ARTHROPLASTY Right x 2  . VAGINAL HYSTERECTOMY     "total"     Home Meds: Prior to Admission medications   Medication Sig Start Date End Date Taking? Authorizing Provider  albuterol (PROVENTIL HFA;VENTOLIN HFA) 108 (90 Base) MCG/ACT inhaler Inhale 2 puffs into the lungs every 6 (six) hours as needed for wheezing or shortness of breath.   Yes Historical Provider, MD  ARTIFICIAL TEAR OP Place 1 drop into both eyes as needed (for dry eyes).   Yes Historical Provider, MD  Cholecalciferol (VITAMIN D3) 5000 units TABS Take 1 tablet by mouth every morning.   Yes Historical Provider, MD  furosemide (LASIX) 20 MG tablet Take 60 mg by mouth every morning.   Yes Historical Provider, MD  isosorbide mononitrate (IMDUR) 30 MG 24 hr tablet Take 1 tablet (30 mg total) by mouth daily. 02/11/16  Yes Will [name], MD  levothyroxine (SYNTHROID, LEVOTHROID) 150 MCG tablet Take 1 tablet (150 mcg total) by mouth daily. 12/08/15  Yes Glean Hess, MD  Melatonin 5 MG TABS Take 1 tablet by mouth at bedtime.   Yes Historical Provider, MD  metFORMIN (GLUCOPHAGE) 500 MG tablet Take 1 tablet (500 mg total) by mouth 2 (two) times daily with a meal. 09/16/15  Yes Adline Potter, MD  metoprolol succinate (TOPROL XL) 25 MG 24 hr tablet Take 1 tablet (25 mg total) by mouth daily. 02/11/16  Yes Will [name], MD  nitroGLYCERIN (NITROSTAT) 0.4 MG SL tablet Place 0.4 mg under the tongue every 5 (five) minutes as needed for chest pain.   Yes Historical Provider, MD  pantoprazole (PROTONIX) 40 MG tablet Take 1 tablet (40 mg total) by mouth daily. 10/16/15  Yes Juline Patch, MD  potassium chloride (KLOR-CON) 8 MEQ tablet Take 1 tablet (8 mEq total) by mouth 2 (two) times daily. 12/21/15   Yes Glean Hess, MD  rosuvastatin (CRESTOR) 40 MG tablet Take 1 tablet (40 mg total) by mouth daily. 09/18/15  Yes Minna Merritts, MD  sitaGLIPtin (JANUVIA) 50 MG tablet Take 50 mg by mouth daily.   Yes Historical Provider, MD  vitamin B-12 (CYANOCOBALAMIN) 500 MCG tablet Take 500 mcg by mouth daily.   Yes Historical Provider, MD  warfarin (COUMADIN) 7.5 MG tablet Take  1 tablet (7.5 mg total) by mouth daily. 01/20/16  Yes Minna Merritts, MD    Allergies:  Allergies  Allergen Reactions  . Demerol [Meperidine] Anaphylaxis    Tolerated Fentanyl 11/10/15  . Multaq [Dronedarone] Diarrhea  . Sulfa Antibiotics Anaphylaxis  . Tetracyclines & Related Other (See Comments) and Swelling    Made nose, lips,  And tongue itchy Made nose, lips,  And tongue itchy    Social History   Socioeconomic History  . Marital status: Married    Spouse name: Not on file  . Number of children: Not on file  . Years of education: Not on file  . Highest education level: Not on file  Occupational History  . Occupation: Retired  Scientific laboratory technician  . Financial resource strain: Not on file  . Food insecurity:    Worry: Not on file    Inability: Not on file  . Transportation needs:    Medical: Not on file    Non-medical: Not on file  Tobacco Use  . Smoking status: Former Smoker    Packs/day: 2.00    Years: 3.00    Pack years: 6.00    Types: Cigarettes  . Smokeless tobacco: Never Used  . Tobacco comment: 10/05/2017 "smoked in my 20's"  Substance and Sexual Activity  . Alcohol use: Yes    Comment: 10/05/2017 "1 mixed drink q 2-3 months"  . Drug use: Never  . Sexual activity: Not on file  Lifestyle  . Physical activity:    Days per week: Not on file    Minutes per session: Not on file  . Stress: Not on file  Relationships  . Social connections:    Talks on phone: Not on file    Gets together: Not on file    Attends religious service: Not on file    Active member of club or organization: Not on file      Attends meetings of clubs or organizations: Not on file    Relationship status: Not on file  . Intimate partner violence:    Fear of current or ex partner: Not on file    Emotionally abused: Not on file    Physically abused: Not on file    Forced sexual activity: Not on file  Other Topics Concern  . Not on file  Social History Narrative   Lives in [place] w/ husband.     Family History  Problem Relation Age of Onset  . Thyroid disease Mother 85       3 days after surgery  . Heart defect Father 21       ?ascending aortic aneurysm  . Breast cancer Sister 24       half sister     ROS:  Please see the history of present illness.     All other systems reviewed and negative.   BP 114/68 (BP Location: Left Arm, Patient Position: Sitting, Cuff Size: Large)   Ht 5\' 3"  (1.6 m)   Wt 216 lb (98 kg)   BMI 38.26 kg/m  Well developed and nourished in no acute distress HENT normal Neck supple with JVP 8-10 Clear Regular rate and rhythm, no murmurs or gallops Abd-soft with active BS No Clubbing cyanosis edema Skin-warm and dry A & Oriented  Grossly normal sensory and motor function  Labs: Cardiac Enzymes No results for input(s): CKTOTAL, CKMB, TROPONINI in the last 72 hours. CBC Lab Results  Component Value Date   WBC 6.7 09/13/2017   HGB 12.6 09/13/2017  HCT 39.5 09/13/2017   MCV 85 09/13/2017   PLT 194 09/13/2017   PROTIME: No results for input(s): LABPROT, INR in the last 72 hours. Chemistry  No results for input(s): NA, K, CL, CO2, BUN, CREATININE, CALCIUM, PROT, BILITOT, ALKPHOS, ALT, AST, GLUCOSE in the last 168 hours.  Invalid input(s): LABALBU Lipids Lab Results  Component Value Date   CHOL 127 04/19/2016   HDL 42 04/19/2016   LDLCALC 43 04/19/2016   TRIG 209 (H) 04/19/2016   BNP No results found for: PROBNP Thyroid Function Tests: No results for input(s): TSH, T4TOTAL, T3FREE, THYROIDAB in the last 72 hours.  Invalid input(s):  FREET3 Miscellaneous Lab Results  Component Value Date   DDIMER <0.27 04/28/2017    Radiology/Studies:  No results found.  ECG demonstrates P synchronous pacing  Assessment and Plan:  Sick Sinus syndrome  Persistent Atrial Fib with prior PVI and redo 7/19  Pacemaker St Jude   OSA  P-wave oversensing resulting false detection of atrial fibrillation  HFpEF   chronic   Coronary artery disease with prior stenting    Volume status is still not euvolemic.  We will increase her torsemide by having her concentrate her medications on 1 day going from 40 daily--80 q. OD.  We will likewise concentrate her potassium. We will check a metabolic profile 2 weeks following this change.  She has had a history of hypokalemia.  Treated hypothyroidism is proved with a TSH of 0.34<---0.04  On Anticoagulation;  No bleeding issues   willcheck Hgb as > 6 months  Renal function to be reassessed  Was stable   Ventricular pacing is at about 45%.  We will reprogram her resting rate from 60--55 and activate a rest rate to 50.  Does not have nocturnal lightheadedness.  Hopefully we can decrease ventricular pacing significantly   Virl Axe

## 2018-04-03 NOTE — Patient Instructions (Signed)
Medication Instructions:  - Your physician has recommended you make the following change in your medication:   1) Demadex (torsemide) 20 mg - take 4 tablets (80 mg) by mouth once every other day  2) Potassium 8 meq- take 2 tablets (16 meq) twice daily on the days you take torsemide)  If you need a refill on your cardiac medications before your next appointment, please call your pharmacy.   Lab work: - Your physician recommends that you have lab work today: BMP/ CBC  - Your physician recommends that you return for lab work in: 2 weeks- BMP  If you have labs (blood work) drawn today and your tests are completely normal, you will receive your results only by: Marland Kitchen MyChart Message (if you have MyChart) OR . A paper copy in the mail If you have any lab test that is abnormal or we need to change your treatment, we will call you to review the results.  Testing/Procedures: - none ordered  Follow-Up: At [facility], you and your health needs are our priority.  As part of our continuing mission to provide you with exceptional heart care, we have created designated Provider Care Teams.  These Care Teams include your primary Cardiologist (physician) and Advanced Practice Providers (APPs -  Physician Assistants and Nurse Practitioners) who all work together to provide you with the care you need, when you need it. . You will need a follow up appointment in 6 months with Dr. Caryl Comes.  Please call our office 2 months in advance to schedule this appointment.    Remote monitoring is used to monitor your Pacemaker of ICD from home. This monitoring reduces the number of office visits required to check your device to one time per year. It allows [place] to keep an eye on the functioning of your device to ensure it is working properly. You are scheduled for a device check from home on 06/19/2018. You may send your transmission at any time that day. If you have a wireless device, the transmission will be sent  automatically. After your physician reviews your transmission, you will receive a postcard with your next transmission date.   Any Other Special Instructions Will Be Listed Below (If Applicable). - N/A

## 2018-04-03 NOTE — Addendum Note (Signed)
Addended byAlvis Lemmings C on: 04/03/2018 05:09 PM   Modules accepted: Orders

## 2018-04-04 ENCOUNTER — Encounter: Payer: Medicare Other | Admitting: Internal Medicine

## 2018-04-04 LAB — CBC WITH DIFFERENTIAL/PLATELET
Basophils Absolute: 0.1 10*3/uL (ref 0.0–0.2)
Basos: 1 %
EOS (ABSOLUTE): 0.2 10*3/uL (ref 0.0–0.4)
Eos: 2 %
Hematocrit: 39.2 % (ref 34.0–46.6)
Hemoglobin: 12.9 g/dL (ref 11.1–15.9)
IMMATURE GRANULOCYTES: 0 %
Immature Grans (Abs): 0 10*3/uL (ref 0.0–0.1)
Lymphocytes Absolute: 2 10*3/uL (ref 0.7–3.1)
Lymphs: 25 %
MCH: 26.8 pg (ref 26.6–33.0)
MCHC: 32.9 g/dL (ref 31.5–35.7)
MCV: 82 fL (ref 79–97)
MONOS ABS: 0.6 10*3/uL (ref 0.1–0.9)
Monocytes: 7 %
Neutrophils Absolute: 5.1 10*3/uL (ref 1.4–7.0)
Neutrophils: 65 %
Platelets: 351 10*3/uL (ref 150–450)
RBC: 4.81 x10E6/uL (ref 3.77–5.28)
RDW: 14.7 % (ref 11.7–15.4)
WBC: 7.9 10*3/uL (ref 3.4–10.8)

## 2018-04-04 LAB — BASIC METABOLIC PANEL
BUN / CREAT RATIO: 8 — AB (ref 12–28)
BUN: 10 mg/dL (ref 8–27)
CO2: 26 mmol/L (ref 20–29)
Calcium: 8.7 mg/dL (ref 8.7–10.3)
Chloride: 101 mmol/L (ref 96–106)
Creatinine, Ser: 1.25 mg/dL — ABNORMAL HIGH (ref 0.57–1.00)
GFR calc Af Amer: 48 mL/min/{1.73_m2} — ABNORMAL LOW (ref >59)
GFR calc non Af Amer: 42 mL/min/{1.73_m2} — ABNORMAL LOW (ref >59)
Glucose: 169 mg/dL — ABNORMAL HIGH (ref 65–99)
Potassium: 3.7 mmol/L (ref 3.5–5.2)
SODIUM: 140 mmol/L (ref 134–144)

## 2018-04-04 LAB — CUP PACEART INCLINIC DEVICE CHECK
Battery Voltage: 3.01 V
Brady Statistic RA Percent Paced: 50 %
Brady Statistic RV Percent Paced: 46 %
Date Time Interrogation Session: 20200121175429
Implantable Lead Implant Date: 20170829
Implantable Lead Location: 753859
Implantable Lead Location: 753860
Implantable Pulse Generator Implant Date: 20170829
Lead Channel Impedance Value: 437.5 Ohm
Lead Channel Impedance Value: 587.5 Ohm
Lead Channel Pacing Threshold Amplitude: 1 V
Lead Channel Pacing Threshold Amplitude: 1.125 V
Lead Channel Pacing Threshold Pulse Width: 0.4 ms
Lead Channel Sensing Intrinsic Amplitude: 12 mV
Lead Channel Sensing Intrinsic Amplitude: 4.4 mV
Lead Channel Setting Pacing Amplitude: 2.125
Lead Channel Setting Pacing Pulse Width: 0.4 ms
Lead Channel Setting Sensing Sensitivity: 2 mV
MDC IDC LEAD IMPLANT DT: 20170829
MDC IDC MSMT BATTERY REMAINING LONGEVITY: 120 mo
MDC IDC MSMT LEADCHNL RV PACING THRESHOLD PULSEWIDTH: 0.4 ms
MDC IDC SET LEADCHNL RV PACING AMPLITUDE: 2.5 V
Pulse Gen Model: 2272
Pulse Gen Serial Number: 7945290

## 2018-04-05 ENCOUNTER — Other Ambulatory Visit: Payer: Self-pay

## 2018-04-05 MED ORDER — LEVOTHYROXINE SODIUM 137 MCG PO TABS: ORAL_TABLET | ORAL | 3 refills | Status: DC

## 2018-04-09 DIAGNOSIS — R7989 Other specified abnormal findings of blood chemistry: Secondary | ICD-10-CM | POA: Diagnosis not present

## 2018-04-09 DIAGNOSIS — E1129 Type 2 diabetes mellitus with other diabetic kidney complication: Secondary | ICD-10-CM | POA: Diagnosis not present

## 2018-04-09 DIAGNOSIS — I10 Essential (primary) hypertension: Secondary | ICD-10-CM | POA: Diagnosis not present

## 2018-04-09 DIAGNOSIS — I509 Heart failure, unspecified: Secondary | ICD-10-CM | POA: Diagnosis not present

## 2018-04-09 DIAGNOSIS — E782 Mixed hyperlipidemia: Secondary | ICD-10-CM | POA: Diagnosis not present

## 2018-04-09 DIAGNOSIS — E890 Postprocedural hypothyroidism: Secondary | ICD-10-CM | POA: Diagnosis not present

## 2018-04-10 ENCOUNTER — Other Ambulatory Visit: Payer: Self-pay | Admitting: Family

## 2018-04-10 ENCOUNTER — Other Ambulatory Visit (HOSPITAL_COMMUNITY): Payer: Self-pay | Admitting: Family

## 2018-04-10 DIAGNOSIS — R7989 Other specified abnormal findings of blood chemistry: Secondary | ICD-10-CM

## 2018-04-17 ENCOUNTER — Ambulatory Visit
Admission: RE | Admit: 2018-04-17 | Discharge: 2018-04-17 | Disposition: A | Discharge status: Home / Self Care | Payer: Medicare Other | Source: Ambulatory Visit | Attending: Family | Admitting: Family

## 2018-04-17 DIAGNOSIS — R7989 Other specified abnormal findings of blood chemistry: Secondary | ICD-10-CM | POA: Diagnosis not present

## 2018-04-17 DIAGNOSIS — K7689 Other specified diseases of liver: Secondary | ICD-10-CM | POA: Diagnosis not present

## 2018-04-18 ENCOUNTER — Other Ambulatory Visit (INDEPENDENT_AMBULATORY_CARE_PROVIDER_SITE_OTHER): Payer: Medicare Other

## 2018-04-18 DIAGNOSIS — Z79899 Other long term (current) drug therapy: Secondary | ICD-10-CM | POA: Diagnosis not present

## 2018-04-18 DIAGNOSIS — I480 Paroxysmal atrial fibrillation: Secondary | ICD-10-CM | POA: Diagnosis not present

## 2018-04-19 LAB — BASIC METABOLIC PANEL
BUN/Creatinine Ratio: 13 (ref 12–28)
BUN: 16 mg/dL (ref 8–27)
CALCIUM: 8.9 mg/dL (ref 8.7–10.3)
CO2: 23 mmol/L (ref 20–29)
CREATININE: 1.2 mg/dL — AB (ref 0.57–1.00)
Chloride: 102 mmol/L (ref 96–106)
GFR calc Af Amer: 50 mL/min/{1.73_m2} — ABNORMAL LOW (ref >59)
GFR calc non Af Amer: 44 mL/min/{1.73_m2} — ABNORMAL LOW (ref >59)
Glucose: 166 mg/dL — ABNORMAL HIGH (ref 65–99)
Potassium: 3.6 mmol/L (ref 3.5–5.2)
Sodium: 142 mmol/L (ref 134–144)

## 2018-04-20 DIAGNOSIS — R945 Abnormal results of liver function studies: Secondary | ICD-10-CM | POA: Diagnosis not present

## 2018-04-24 ENCOUNTER — Telehealth: Payer: Self-pay | Admitting: Internal Medicine

## 2018-04-24 MED ORDER — INSULIN NPH ISOPHANE & REGULAR (70-30) 100 UNIT/ML ~~LOC~~ SUSP: SUBCUTANEOUS | 2 refills | Status: DC

## 2018-04-24 NOTE — Telephone Encounter (Signed)
RX sent

## 2018-04-24 NOTE — Telephone Encounter (Signed)
Let's send what she is actually taking

## 2018-04-24 NOTE — Telephone Encounter (Signed)
RX states 28 AM 24 PM  Patient states she takes 29 AM and 24 PM.  Please advise.

## 2018-04-24 NOTE — Telephone Encounter (Signed)
Patient called re: she is out of insulin NPH-regular Human (70-30) 100 UNIT/ML injection-. Please call patient at ph# [phone] to advise. Patient states she is using 29 units in the a.m. and 24 units in the p.m. ALSO Patient requests a new RX for the insulin listed above vfor 60 units per day so she can adjust dosage called into [facility] on [address] asap.

## 2018-04-24 NOTE — Telephone Encounter (Signed)
[facility] ph# [phone] called to request new RX for insulin NPH-regular Human (70-30) 100 UNIT/ML injection Also with directions. Pharmacy has conflicting directions. Please call pharmacy at the ph# listed above.

## 2018-04-26 NOTE — Telephone Encounter (Signed)
This has been called in

## 2018-04-30 DIAGNOSIS — R945 Abnormal results of liver function studies: Secondary | ICD-10-CM | POA: Diagnosis not present

## 2018-04-30 DIAGNOSIS — R932 Abnormal findings on diagnostic imaging of liver and biliary tract: Secondary | ICD-10-CM | POA: Diagnosis not present

## 2018-04-30 DIAGNOSIS — H40003 Preglaucoma, unspecified, bilateral: Secondary | ICD-10-CM | POA: Diagnosis not present

## 2018-04-30 DIAGNOSIS — R748 Abnormal levels of other serum enzymes: Secondary | ICD-10-CM | POA: Diagnosis not present

## 2018-04-30 LAB — HM DIABETES EYE EXAM

## 2018-05-04 ENCOUNTER — Other Ambulatory Visit: Payer: Self-pay | Admitting: Internal Medicine

## 2018-05-04 NOTE — Telephone Encounter (Signed)
Please review for refill, Thanks !  

## 2018-05-04 NOTE — Telephone Encounter (Signed)
This is a [place] pt 

## 2018-05-14 DIAGNOSIS — H353112 Nonexudative age-related macular degeneration, right eye, intermediate dry stage: Secondary | ICD-10-CM | POA: Diagnosis not present

## 2018-05-14 DIAGNOSIS — H35033 Hypertensive retinopathy, bilateral: Secondary | ICD-10-CM | POA: Diagnosis not present

## 2018-05-14 DIAGNOSIS — H353221 Exudative age-related macular degeneration, left eye, with active choroidal neovascularization: Secondary | ICD-10-CM | POA: Diagnosis not present

## 2018-05-14 DIAGNOSIS — H35433 Paving stone degeneration of retina, bilateral: Secondary | ICD-10-CM | POA: Diagnosis not present

## 2018-05-16 ENCOUNTER — Encounter: Admission: RE | Payer: Self-pay | Source: Home / Self Care

## 2018-05-16 ENCOUNTER — Ambulatory Visit: Admission: RE | Admit: 2018-05-16 | Payer: Medicare Other | Source: Home / Self Care | Admitting: Internal Medicine

## 2018-05-16 SURGERY — ESOPHAGOGASTRODUODENOSCOPY (EGD) WITH PROPOFOL
Anesthesia: General

## 2018-06-19 ENCOUNTER — Other Ambulatory Visit: Payer: Self-pay

## 2018-06-19 ENCOUNTER — Ambulatory Visit (INDEPENDENT_AMBULATORY_CARE_PROVIDER_SITE_OTHER): Payer: Medicare Other | Admitting: *Deleted

## 2018-06-19 DIAGNOSIS — I495 Sick sinus syndrome: Secondary | ICD-10-CM | POA: Diagnosis not present

## 2018-06-19 LAB — CUP PACEART REMOTE DEVICE CHECK
Battery Remaining Longevity: 131 mo
Battery Remaining Percentage: 95.5 %
Battery Voltage: 3.02 V
Brady Statistic AP VP Percent: 15 %
Brady Statistic AP VS Percent: 6.9 %
Brady Statistic AS VP Percent: 4.1 %
Brady Statistic AS VS Percent: 74 %
Brady Statistic RA Percent Paced: 21 %
Brady Statistic RV Percent Paced: 19 %
Date Time Interrogation Session: 20200407073002
Implantable Lead Implant Date: 20170829
Implantable Lead Implant Date: 20170829
Implantable Lead Location: 753859
Implantable Lead Location: 753860
Implantable Pulse Generator Implant Date: 20170829
Lead Channel Impedance Value: 430 Ohm
Lead Channel Impedance Value: 560 Ohm
Lead Channel Pacing Threshold Amplitude: 1 V
Lead Channel Pacing Threshold Amplitude: 1 V
Lead Channel Pacing Threshold Pulse Width: 0.4 ms
Lead Channel Pacing Threshold Pulse Width: 0.4 ms
Lead Channel Sensing Intrinsic Amplitude: 12 mV
Lead Channel Sensing Intrinsic Amplitude: 4.5 mV
Lead Channel Setting Pacing Amplitude: 2 V
Lead Channel Setting Pacing Amplitude: 2.5 V
Lead Channel Setting Pacing Pulse Width: 0.4 ms
Lead Channel Setting Sensing Sensitivity: 2 mV
Pulse Gen Model: 2272
Pulse Gen Serial Number: 7945290

## 2018-06-21 ENCOUNTER — Telehealth: Payer: Self-pay | Admitting: Internal Medicine

## 2018-06-21 NOTE — Telephone Encounter (Signed)
Patient c/o Palpitations:  High priority if patient c/o lightheadedness, shortness of breath, or chest pain  1) How long have you had palpitations/irregular HR/ Afib? Are you having the symptoms now? SINCE 9:30 AM , YES  2) Are you currently experiencing lightheadedness, SOB or CP? SOB  3) Do you have a history of afib (atrial fibrillation) or irregular heart rhythm? YES  4) Have you checked your BP or HR? (document readings if available): HR SITTING IS IN THE 60S, IF SHE GETS UP IT GOES TO OVER 100  5) Are you experiencing any other symptoms? CHEST DISCOMFORT BUT NOT SEVERE, EXTREME DIZZINESS

## 2018-06-21 NOTE — Telephone Encounter (Signed)
I spoke with the pt and she states when she stands she feels extremely short of breath and dizzy. She states her heart rhythm been all over the place today. I had her send a manual transmission with her home monitor. I told her if the nurse will try to call her back today but if not she will call first thing in the morning.

## 2018-06-21 NOTE — Telephone Encounter (Addendum)
Per presenting rhythm from transmission, pt is in and out of A-fib. AT/AF burden is about stable with previous trends.

## 2018-06-21 NOTE — Telephone Encounter (Signed)
Discussed with Dr. Caryl Comes via phone. Plan to start amiodarone 400mg  BID x2 weeks, then decrease to 400mg  daily x2 weeks, then decrease to 200mg  day. Change levothyroxine dosing to 0.5 tablet every day except Wed & Sun. Labs and f/u via virtual visit in 3-4 weeks.  Spoke with patient. She reports that symptoms have improved and she feels fine right now. Feels she is back in SR.  Discussed Dr. Olin Pia recommendations to start amiodarone. Pt declines, reports "I think they told me not to take that, isn't that what caused my lung scarring?" Advised I don't clearly see this in the notes, but pt reports that she was told that at some point during her 04/2017 hospitalization for ShOB. Pt asked about Tikosyn as this was discussed with her previously, but advised we are not doing voluntary hospital admissions right now.   Pt reports she will wait to discuss options with Dr. Caryl Comes. She is agreeable to virtual visit and has a smart phone. Gave [facility] phone number if she feels her ventricular rate is elevated. Pt verbalizes understanding of information and thanked me for my call.  Routed to [place], Therapist, sports, and Administrator, sports, Therapist, sports, for assistance scheduling virtual visit with Dr. Caryl Comes.

## 2018-06-26 ENCOUNTER — Telehealth: Payer: Self-pay | Admitting: Internal Medicine

## 2018-06-26 NOTE — Telephone Encounter (Signed)
I spoke with the patient. She is agreeable with a VIDEO VISIT ([email or web address]) on 07/03/2018 @ 12:00 pm with Dr. Caryl Comes.      Virtual Visit Pre-Appointment Phone Call  Steps For Call:  1. Confirm consent - "In the setting of the current Covid19 crisis, you are scheduled for a (phone or video) visit with your provider on (date) at (time).  Just as we do with many in-office visits, in order for you to participate in this visit, we must obtain consent.  If you'd like, I can send this to your mychart (if signed up) or email for you to review.  Otherwise, I can obtain your verbal consent now.  All virtual visits are billed to your insurance company just like a normal visit would be.  By agreeing to a virtual visit, we'd like you to understand that the technology does not allow for your provider to perform an examination, and thus may limit your provider's ability to fully assess your condition.  Finally, though the technology is pretty good, we cannot assure that it will always work on either your or our end, and in the setting of a video visit, we may have to convert it to a phone-only visit.  In either situation, we cannot ensure that we have a secure connection.  Are you willing to proceed?" STAFF: Did the patient verbally acknowledge consent to telehealth visit? Document YES/NO here: Yes       TELEPHONE CALL NOTE  Dawn Garcia has been deemed a candidate for a follow-up tele-health visit to limit community exposure during the Covid-19 pandemic. I spoke with the patient via phone to ensure availability of phone/video source, confirm preferred email & phone number, and discuss instructions and expectations.  I reminded Dawn Garcia to be prepared with any vital sign and/or heart rhythm information that could potentially be obtained via home monitoring, at the time of her visit. I reminded Dawn Garcia to expect a phone call at the time of her visit if her visit.  [name], RN  06/26/2018 4:37 PM    IF USING DOXIMITY or DOXY.ME - The patient will receive a link just prior to their visit, either by text or email (to be determined day of appointment depending on if it's doxy.me or Doximity).

## 2018-06-26 NOTE — Telephone Encounter (Signed)
I spoke with the patient. She states she is doing ok, but is still having some SOB. I advised I was calling to schedule her for an E-visit with Dr. Caryl Comes. The patient states she was agreeable with a Video visit on 07/03/18 @ 12 pm.   Consent obtained- see separate phone encounter.

## 2018-06-27 ENCOUNTER — Ambulatory Visit (INDEPENDENT_AMBULATORY_CARE_PROVIDER_SITE_OTHER): Payer: Medicare Other

## 2018-06-27 ENCOUNTER — Other Ambulatory Visit: Payer: Self-pay

## 2018-06-27 DIAGNOSIS — E1159 Type 2 diabetes mellitus with other circulatory complications: Secondary | ICD-10-CM

## 2018-06-27 DIAGNOSIS — H353221 Exudative age-related macular degeneration, left eye, with active choroidal neovascularization: Secondary | ICD-10-CM | POA: Diagnosis not present

## 2018-06-27 DIAGNOSIS — E1165 Type 2 diabetes mellitus with hyperglycemia: Secondary | ICD-10-CM

## 2018-06-27 LAB — POCT GLYCOSYLATED HEMOGLOBIN (HGB A1C): Hemoglobin A1C: 8.5 % — AB (ref 4.0–5.6)

## 2018-06-27 NOTE — Progress Notes (Signed)
Patient presents to clinic to drop off blood sugar log, is concerned because they have been going up and is requesting A1c check today. Patient also wanted to relay to Dr. Cruzita Lederer that she is having issues with her Afib (following up with her cardiologist) and wonders if blood sugar can affect Afib or if Afib can affect her blood sugar?  Lab Results  Component Value Date   HGBA1C 8.4 (A) 02/05/2018    A1c checked today 8.5.  Copies of patient's recent blood sugar readings placed on Dr. Cruzita Lederer desk for review.   Patient has OV scheduled for 4/27

## 2018-06-28 ENCOUNTER — Encounter: Payer: Self-pay | Admitting: Cardiology

## 2018-06-28 NOTE — Progress Notes (Signed)
Remote pacemaker transmission.   

## 2018-06-29 ENCOUNTER — Telehealth: Payer: Self-pay

## 2018-06-29 NOTE — Telephone Encounter (Signed)
Great! Ty! 

## 2018-06-29 NOTE — Telephone Encounter (Signed)
-----   Message from Philemon Kingdom, MD sent at 06/28/2018  3:59 PM EDT ----- Joellyn Rued, I got this pt's sugar logs >> need to discuss changes in tx - can we schedule her (Doxy) sooner than 04/27/) Ty! C

## 2018-06-29 NOTE — Telephone Encounter (Signed)
Dawn Garcia has scheduled her for 07/02/18

## 2018-07-02 ENCOUNTER — Ambulatory Visit (INDEPENDENT_AMBULATORY_CARE_PROVIDER_SITE_OTHER): Payer: Medicare Other | Admitting: Internal Medicine

## 2018-07-02 ENCOUNTER — Encounter: Payer: Self-pay | Admitting: Internal Medicine

## 2018-07-02 DIAGNOSIS — E1165 Type 2 diabetes mellitus with hyperglycemia: Secondary | ICD-10-CM | POA: Diagnosis not present

## 2018-07-02 DIAGNOSIS — E1159 Type 2 diabetes mellitus with other circulatory complications: Secondary | ICD-10-CM | POA: Diagnosis not present

## 2018-07-02 DIAGNOSIS — E890 Postprocedural hypothyroidism: Secondary | ICD-10-CM

## 2018-07-02 DIAGNOSIS — E213 Hyperparathyroidism, unspecified: Secondary | ICD-10-CM

## 2018-07-02 MED ORDER — SEMAGLUTIDE(0.25 OR 0.5MG/DOS) 2 MG/1.5ML ~~LOC~~ SOPN: 0.5000 mg | PEN_INJECTOR | SUBCUTANEOUS | 5 refills | Status: DC

## 2018-07-02 NOTE — Progress Notes (Addendum)
Patient ID: Deisy Ozbun [facility], female   DOB: 1940/05/28, 78 y.o.   MRN: [id number]   Patient location: Home My location: Office  Referring Provider: Perrin Maltese, MD  I connected with the patient on 07/02/2018  at 10:00 AM EDT by a video enabled telemedicine application and verified that I am speaking with the correct person.   I discussed the limitations of evaluation and management by telemedicine and the availability of in person appointments. The patient expressed understanding and agreed to proceed.   Details of the encounter are shown below.  HPI  Markeita Alicia Balthazor is a 78 y.o.-year-old female, initially referred by her PCP, Dr. Humphrey Rolls, presenting for follow-up for uncontrolled post ablative hypothyroidism and DM2, insulin-dependent since 2013, with long-term complications (atrial fibrillation, CHF, CKD).  Last visit 3 months ago.  She has more Afib.  Post ablative hypothyroidism: Pt. has been dx with Graves ds. At 78 y/o. She had RAI Tx in 1960 >> postablative hypothyroidism  >> started on levothyroxine.  Patient had consistently suppressed TSH levels over the years so we are now decreasing her doses very slowly due to fatigue.  At last visit we decreased the dose to 137 mcg daily.  She is taking the levothyroxine: - in am - fasting - at least 30 min from b'fast - no Fe, MVI - + calcium at night (1-2 tabs) - + PPIs at night - not on Biotin anymore  Of note, she was on amiodarone in 03/2017 >> stopped 04/2017 b/c AKI.  Reviewed patient's TFTs: Lab Results  Component Value Date   TSH 0.34 (L) 03/27/2018   TSH 0.04 (L) 02/05/2018   TSH 0.198 (L) 04/26/2017   TSH 0.174 (L) 04/18/2017   TSH 0.034 (L) 03/10/2016   TSH 0.050 (L) 12/08/2015   TSH 0.061 (L) 11/08/2015   TSH 0.079 (L) 09/09/2015   TSH 0.229 (L) 08/19/2015   FREET4 1.55 03/27/2018   FREET4 1.40 02/05/2018   FREET4 3.54 (H) 04/26/2017   FREET4 2.08 (H) 11/08/2015   FREET4 2.18 (H) 09/09/2015   FREET4  0.93 08/19/2015   T3FREE 3.4 02/05/2018   T3FREE 1.8 (L) 04/26/2017   T3FREE 3.0 11/08/2015   T3FREE 2.8 09/09/2015   T3FREE 3.1 08/19/2015  10/25/2017: TSH 0.021 (0.45-5.33), free T4 1.7 (0.66-1.14)  Graves' antibodies were not elevated: Lab Results  Component Value Date   TSI 102 02/05/2018    No results found for: THGAB No components found for: TPOAB  She continues to describe: weight gain Fatigue No heat or cold intolerance No anxiety or depression Diarrhea/constipation Hair thinning Headaches Generalized joint pain and swelling  Pt denies: - feeling nodules in neck - hoarseness - dysphagia - choking - SOB with lying down  She has + FH of thyroid disorders in: mother (died after sx for toxic goiter) and sister (goiter).  No family history of thyroid cancer. No history of radiation therapy to head or neck except RAI treatment and eye radiotherapy. No herbal supplements.  No iodine supplements.  She has Graves' ophthalmopathy>> had increased IO pressure and did not respond to IV steroids >> she had radiation therapy and then eyelid surgery.  DM2: -Insulin-dependent since 2013:  Reviewed HbA1c levels: Lab Results  Component Value Date   HGBA1C 8.5 (A) 06/27/2018   HGBA1C 8.4 (A) 02/05/2018   HGBA1C 9.4 (H) 04/28/2017   HGBA1C 7.8 (H) 11/29/2016   HGBA1C 8.1 (H) 09/05/2016   HGBA1C 8.2 (H) 05/02/2016   HGBA1C 7.5 (H) 03/10/2016  HGBA1C 7.8 (H) 12/08/2015   HGBA1C 6.9 (H) 08/19/2015  10/25/2017: HbA1c 8.4%  Last visit she was on: Novolin insulin 70/30: - 16 units (and if CBG in the am >130), add 20 units at bedtime Tried Jardiance >> diarrhea.  We changed to: Novolin 70/30: - 20 >> 29 units in am before b'fast - 24 >> 24 units before dinner  She checks CBGs twice a day: - am: 124, 130-168, 180, 196 >> 110-160, 175, 206 >> [phone], 245 - lunch: 201 >> n/c - 2h after lunch: 149 >> 151, 271 - before dinner: 141-328 (sick) >> 157-302 - at bedtime:  137-205, 265 >> 126-246 >> 225-338  She has a history of CKD: Lab Results  Component Value Date   BUN 16 04/18/2018   BUN 10 04/03/2018   Lab Results  Component Value Date   CREATININE 1.20 (H) 04/18/2018   CREATININE 1.25 (H) 04/03/2018  10/25/2017: BUN/creatinine: 17/1.4, GFR 37  Latest eye exam 01/2018: Gets intraocular injections.  She has a history of hyperparathyroidism: - h/o first sx: 1978 (2 glands excised) - h/o second sx: 2010 (1 gland excised - 3.2 g)  At last visit we started 1 tablet of times a day.  She takes 1 to 2 tablets a day.  She tells me that she takes this in the afternoon, but she does develop a headache before taking it, which is relieved after taking the calcium.  Her ionized calcium levels were slightly low: Component     Latest Ref Rng & Units 02/05/2018 03/27/2018  Calcium Ionized     4.8 - 5.6 mg/dL 4.58 (L) 4.55 (L)   While total calcium was normal: Lab Results  Component Value Date   CALCIUM 8.9 04/18/2018   CALCIUM 8.7 04/03/2018   CALCIUM 9.1 09/13/2017   CALCIUM 9.1 05/19/2017   CALCIUM 8.9 05/16/2017   Her vitamin D level was normal: Lab Results  Component Value Date   VD25OH 80.87 03/27/2018   VD25OH 59.0 08/19/2015   On vitamin D 1000 IU daily.  She also has a history of OSA-on CPAP, osteoarthritis, history of melanoma, macular degeneration.  ROS: Constitutional: + weight gain/no weight loss, no fatigue, no subjective hyperthermia, no subjective hypothermia, + insomnia Eyes: no blurry vision, no xerophthalmia ENT: no sore throat, + see HPI Cardiovascular: no CP/no SOB/no palpitations/no leg swelling Respiratory: no cough/no SOB/no wheezing Gastrointestinal: + N/no V/no D/no C/no acid reflux Musculoskeletal: no muscle aches/no joint aches Skin: no rashes, no hair loss Neurological: no tremors/no numbness/no tingling/no dizziness, + headaches  I reviewed pt's medications, allergies, PMH, social hx, family hx, and changes were  documented in the history of present illness. Otherwise, unchanged from my initial visit note.  Past Medical History:  Diagnosis Date  . Age-related macular degeneration, dry, right eye   . Age-related macular degeneration, wet, left eye ([place])   . Arthritis    "all over; particularly in hands/fingers; all my joints" (10/05/2017)  . Atrial fibrillation ([place])   . CAD (coronary artery disease)   . CHF (congestive heart failure) (HCC)     A-Fib  . Chronic kidney disease   . COPD (chronic obstructive pulmonary disease) ([place])    "very mild" (10/05/2017)  . Family history of adverse reaction to anesthesia    "daughter w/PONV & woke up during endoscopy" (10/05/2017)  . GERD (gastroesophageal reflux disease)   . Graves' disease    S/P "radioactive tx"  . History of blood transfusion    "related to shoulder  replacement"   . History of gout    "no longer on daily RX" (10/05/2017)  . History of hiatal hernia   . Hyperlipidemia   . Hypertension   . Hypocalcemia   . Hypokalemia   . Hypothyroidism   . Macular degeneration   . Melanoma ([place]) ~ 2012   "off my back" (10/05/2017)  . Myocardial infarction [facility])    "was told I'd had one; don't really know when" (10/05/2017)  . OSA on CPAP   . 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 Sinus node dysfunction (HCC)   . Type II diabetes mellitus ([place])   . Vitamin B12 deficiency   . Vitamin D deficiency    Past Surgical History:  Procedure Laterality Date  . APPENDECTOMY    . ATRIAL FIBRILLATION ABLATION N/A 10/05/2017   Procedure: ATRIAL FIBRILLATION ABLATION;  Surgeon: Constance Haw, MD;  Location: [place] [facility];  Service: Cardiovascular;  Laterality: N/A;  . ATRIAL FIBRILLATION ABLATION  ~ 2014  . CARDIAC CATHETERIZATION  04/2017  . COLONOSCOPY W/ BIOPSIES AND POLYPECTOMY  2015   1 polyp removed- repeat 3 years  . CORONARY ANGIOPLASTY WITH STENT PLACEMENT  ~  2014  . EP IMPLANTABLE DEVICE N/A 11/10/2015   Procedure: Pacemaker Implant;  Surgeon: Will [name], MD;  Location: [place] [facility];  Service: Cardiovascular;  Laterality: N/A;  . EP IMPLANTABLE DEVICE N/A 11/11/2015   Procedure: Lead Revision/Repair;  Surgeon: Evans Lance, MD;  Location: [place] [facility];  Service: Cardiovascular;  Laterality: N/A;  . INGUINAL HERNIA REPAIR Left 1951  . JOINT REPLACEMENT    . LAPAROSCOPIC CHOLECYSTECTOMY    . MELANOMA EXCISION  ~ 2012   "off my back" (10/05/2017)  . PARATHYROIDECTOMY     "removed 3 glands; still have 1 gland" (10/05/2017)  . RIGHT/LEFT HEART CATH AND CORONARY ANGIOGRAPHY N/A 05/03/2017   Procedure: RIGHT/LEFT HEART CATH AND CORONARY ANGIOGRAPHY;  Surgeon: Leonie Man, MD;  Location: [place] [facility];  Service: Cardiovascular;  Laterality: N/A;  . TONSILLECTOMY    . TOTAL SHOULDER ARTHROPLASTY Right x 2  . VAGINAL HYSTERECTOMY     "total"   Social History   Socioeconomic History  . Marital status: Married    Spouse name: Not on file  . Number of children: 3  . Years of education: Not on file  . Highest education level: Not on file  Occupational History  . Occupation: Retired Animal nutritionist  . Financial resource strain: Not on file  . Food insecurity:    Worry: Not on file    Inability: Not on file  . Transportation needs:    Medical: Not on file    Non-medical: Not on file  Tobacco Use  . Smoking status: Former Smoker, quit in 1967    Packs/day: 2.00    Years: 3.00    Pack years: 6.00    Types: Cigarettes  . Smokeless tobacco: Never Used  . Tobacco comment: 10/05/2017 "smoked in my 20's"  Substance and Sexual Activity  . Alcohol use: Yes    Comment: 1-2 mixed drinks per month  . Drug use: Never  Lifestyle  Relationships  Social History Narrative   Lives in [place] w/ husband.  She walks her dog every day.  Current Outpatient Medications on File Prior to Visit  Medication Sig Dispense Refill   . carboxymethylcellulose (REFRESH PLUS) 0.5 % SOLN Place  1 drop into both eyes 3 (three) times daily as needed.    . Cholecalciferol (VITAMIN D3) 5000 units TABS Take 1 tablet by mouth every morning.    . diltiazem (CARDIZEM CD) 180 MG 24 hr capsule TAKE 1 CAPSULE EVERY DAY 30 capsule 11  . diphenhydramine-acetaminophen (TYLENOL PM) 25-500 MG TABS tablet Take 2 tablets by mouth at bedtime.     Marland Kitchen ELIQUIS 5 MG TABS tablet TAKE ONE TABLET TWICE DAILY 180 tablet 1  . insulin NPH-regular Human (70-30) 100 UNIT/ML injection Inject 29 units in the morning before breakfast and inject 24 units in the evening before dinner 20 mL 2  . Insulin Syringe-Needle U-100 (INSULIN SYRINGE .3CC/31GX5/16") 31G X 5/16" 0.3 ML MISC Use to inject insulin twice daily 200 each 3  . levothyroxine (SYNTHROID, LEVOTHROID) 137 MCG tablet TAKE ONE TABLET EVERY OTHER DAY 30 tablet 2  . Multiple Vitamins-Minerals (ICAPS AREDS 2) CAPS Take 1 capsule by mouth 2 (two) times daily.    . nitroGLYCERIN (NITROSTAT) 0.4 MG SL tablet Place 0.4 mg under the tongue every 5 (five) minutes as needed for chest pain.    . pantoprazole (PROTONIX) 40 MG tablet Take 1 tablet (40 mg total) by mouth daily. 90 tablet 3  . potassium chloride (KLOR-CON) 8 MEQ tablet Take 2 tablets (16 meq) by mouth twice daily on the days you take torsemide 180 tablet 3  . rosuvastatin (CRESTOR) 40 MG tablet TAKE ONE TABLET BY MOUTH EVERY DAY 90 tablet 1  . torsemide (DEMADEX) 20 MG tablet Take 4 tablets (80 mg) by mouth once every other day 180 tablet 3  . vitamin B-12 (CYANOCOBALAMIN) 500 MCG tablet Take 500 mcg by mouth daily.     No current facility-administered medications on file prior to visit.    Allergies  Allergen Reactions  . Demerol [Meperidine] Anaphylaxis    Tolerated Fentanyl 11/10/15  . Multaq [Dronedarone] Diarrhea  . Sulfa Antibiotics Anaphylaxis  . Tetracyclines & Related Other (See Comments) and Swelling    Made nose, lips,  And tongue  itchy Made nose, lips,  And tongue itchy   Family History  Problem Relation Age of Onset  . Thyroid disease Mother 40       3 days after surgery  . Heart defect Father 16       ?ascending aortic aneurysm  . Breast cancer Sister 55       half sister  Also: -Diabetes in maternal grandparents -Hypertension and uncle-hyperlipidemia in the whole family -Heart disease in father, mother, uncle-thyroid problems in mother and sister-cancer in son, grandmother, half sister  PE: There were no vitals taken for this visit. Wt Readings from Last 3 Encounters:  04/03/18 216 lb (98 kg)  03/27/18 212 lb (96.2 kg)  02/05/18 218 lb (98.9 kg)   Constitutional:  in NAD  The physical exam was not performed (virtual visit).  ASSESSMENT: 1. Postablative Hypothyroidism  PLAN:  1. Patient with very longstanding, uncontrolled, hypothyroidism, developed after RAI treatment. She has a long history of suppressed TSH and we discussed about side effects of over replacement with thyroid hormones to include cardiac arrhythmia (she already has a history of this-atrial fibrillation), CHF (she also has a history of this), hypercoagulability (including stroke, heart attack, and pulmonary embolism) and in the long run osteoporosis and increased mortality.   - latest thyroid labs reviewed with pt >> TSH only slightly lower than normal at last check, after which we decreased her levothyroxine further. - she continues on  LT4 135 Mcg daily - pt feels good on this dose. - we discussed about taking the thyroid hormone every day, with water, >30 minutes before breakfast, separated by >4 hours from acid reflux medications, calcium, iron, multivitamins. Pt. is taking it correctly. - will check thyroid tests at next visit: TSH and fT4  2. DM2 -insulin-dependent -Patient with longstanding uncontrolled type 2 diabetes, on premixed insulin regimen, which is insufficient.  Her sugars started to increase at the beginning of March  and they are quite high now, especially in the second half of the day.  She had an HbA1c checked several days ago which was 8.5%, slightly higher than before.  At last visit, we discussed about the fact that her premixed insulin regimen is not very flexible but we decided to wait until after the holidays to change it.  At this visit, we discussed about potentially continuing with more flexible doses of the premixed insulin while adding the GLP-1 receptor agonist.  She agrees with this.  We discussed about benefits and possible side effects.  We will start at a low dose and advance as tolerated.  We also discussed about a backup plan, if she cannot afford Ozempic, in which case, we will need to switch to basal-bolus insulin regimen.  She has previously been on Lantus. - I suggested to:  Patient Instructions  Please change: Novolin 70/30: - 26-30 units in am before b'fast - 24-28 units before dinner  Please start Ozempic 0.25 mg weekly in a.m. (for example on Sunday morning) x 4 weeks, then increase to 0.5 mg weekly in a.m. if no nausea or hypoglycemia.  Please move calcium (1-2 Tums) to lunchtime.  Please continue levothyroxine 137 mcg every other day.  Take the thyroid hormone every day, with water, at least 30 minutes before breakfast, separated by at least 4 hours from: - acid reflux medications - calcium - iron - multivitamins  Please return in 3 months with your sugar log.   - We will check her HbA1c when she returns to the clinic - continue checking sugars at different times of the day - check 2x a day, rotating checks - advised for yearly eye exams >> she is UTD - Return to clinic in 3 mo with sugar log   3. H/o hyperparathyroidism -Now status post 3/4 parathyroidectomy -Ionized calcium levels were slightly low in the past, however, her total calcium was normal at last check 04/2018 -At last visit I advised her to add 1 tablet of Tums a day.  She takes 1-2 a day, in the afternoon.   However, she develops a headache before she takes Tums and this is relieved after taking the calcium.  I advised her to move the calcium with lunch. -We will recheck her ionized calcium when she returns to the clinic -Her vitamin D level was normal in 03/2018  Philemon Kingdom, MD PhD [facility]

## 2018-07-02 NOTE — Patient Instructions (Addendum)
Please change: Novolin 70/30: - 26-30 units in am before b'fast - 24-28 units before dinner  Please start Ozempic 0.25 mg weekly in a.m. (for example on Sunday morning) x 4 weeks, then increase to 0.5 mg weekly in a.m. if no nausea or hypoglycemia.  Please move calcium (1-2 Tums) to lunchtime.  Please continue levothyroxine 137 mcg every other day.  Take the thyroid hormone every day, with water, at least 30 minutes before breakfast, separated by at least 4 hours from: - acid reflux medications - calcium - iron - multivitamins  Please return in 3 months with your sugar log.

## 2018-07-03 ENCOUNTER — Telehealth (INDEPENDENT_AMBULATORY_CARE_PROVIDER_SITE_OTHER): Payer: Medicare Other | Admitting: Internal Medicine

## 2018-07-03 ENCOUNTER — Other Ambulatory Visit: Payer: Self-pay

## 2018-07-03 VITALS — BP 148/88 | HR 82 | Ht 63.0 in | Wt 215.0 lb

## 2018-07-03 DIAGNOSIS — I480 Paroxysmal atrial fibrillation: Secondary | ICD-10-CM | POA: Diagnosis not present

## 2018-07-03 DIAGNOSIS — G4733 Obstructive sleep apnea (adult) (pediatric): Secondary | ICD-10-CM | POA: Diagnosis not present

## 2018-07-03 DIAGNOSIS — Z950 Presence of cardiac pacemaker: Secondary | ICD-10-CM

## 2018-07-03 DIAGNOSIS — R42 Dizziness and giddiness: Secondary | ICD-10-CM

## 2018-07-03 DIAGNOSIS — I495 Sick sinus syndrome: Secondary | ICD-10-CM | POA: Diagnosis not present

## 2018-07-03 DIAGNOSIS — I2510 Atherosclerotic heart disease of native coronary artery without angina pectoris: Secondary | ICD-10-CM

## 2018-07-03 DIAGNOSIS — I5032 Chronic diastolic (congestive) heart failure: Secondary | ICD-10-CM

## 2018-07-03 DIAGNOSIS — I4819 Other persistent atrial fibrillation: Secondary | ICD-10-CM

## 2018-07-03 MED ORDER — METOLAZONE 2.5 MG PO TABS: ORAL_TABLET | ORAL | 0 refills | Status: DC

## 2018-07-03 NOTE — Patient Instructions (Signed)
Medication Instructions:  - Your physician has recommended you make the following change in your medication:   1) Start zaroxalyn (metolazone) 2.5 mg- take 1 tablet (2.5 mg) by mouth twice weekly, take 30 minutes prior to torsemide  If you need a refill on your cardiac medications before your next appointment, please call your pharmacy.   Lab work: - none ordered  If you have labs (blood work) drawn today and your tests are completely normal, you will receive your results only by: Marland Kitchen MyChart Message (if you have MyChart) OR . A paper copy in the mail If you have any lab test that is abnormal or we need to change your treatment, we will call you to review the results.  Testing/Procedures: - none ordered  Follow-Up: At [facility], you and your health needs are our priority.  As part of our continuing mission to provide you with exceptional heart care, we have created designated Provider Care Teams.  These Care Teams include your primary Cardiologist (physician) and Advanced Practice Providers (APPs -  Physician Assistants and Nurse Practitioners) who all work together to provide you with the care you need, when you need it. on thrusday 4/23 with Dr. Caryl Comes - at 8:30 am  Any Other Special Instructions Will Be Listed Below (If Applicable). - send a transmission today (already done)

## 2018-07-03 NOTE — Progress Notes (Signed)
Electrophysiology TeleHealth Note   Due to national recommendations of social distancing due to COVID 19, an audio/video telehealth visit is felt to be most appropriate for this patient at this time.  See MyChart message from today for the patient's consent to telehealth for [facility].   Date:  07/03/2018   ID:  Dawn Garcia, DOB Jul 14, 1940, MRN [id number]  Location: patient's home  Provider location: [address], [place]  Evaluation Performed: Follow-up visit  PCP:  Perrin Maltese, MD  Cardiologist:     Electrophysiologist:  SK   Chief Complaint:  Dyspnea   History of Present Illness:    Dawn Garcia is a 78 y.o. female who presents via audio/video conferencing for a telehealth visit today.  Since last being seen in our clinic for HFpEF in the setting of morbid obesity, ischemic heart disease with prior stenting and recurrent atrial fibrillation the patient reports continuing to feel mildly with tachypalpitations and significant dyspnea on exertion at less than 20-30 feet.   Because of ongoing complaints of palpitations per mode switch detection rate has been reprogrammed as low as 110 bpm; this has identified short nonsustained runs of PACs with rates in the 120-40 range.  These are relatively brief.   She is noted some abdominal swelling.  Some peripheral edema.  Dietary intake of fluid is replete, salt intake is somewhat moderately restricted  She has treated sleep apnea.  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  No evidence of pulmonary hypertension.  Elevated right and left ventricular end-diastolic pressures with an elevated wedge and right atrial pressure.  Antiarrhythmics Date Reason stopped  amiodarone   Lung?  Dronaderone  Diarrhea         The patient denies symptoms of fevers, chills, cough, or new SOB worrisome for COVID 19.    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[place])    Arthritis    "all over; particularly in hands/fingers; all my joints" (10/05/2017)   Atrial fibrillation (HCC)    CAD (coronary artery disease)    CHF (congestive heart failure) ([place])     A-Fib   Chronic kidney disease    COPD (chronic obstructive pulmonary disease) ([place])    "very mild" (10/05/2017)   Family history of adverse reaction to anesthesia    "daughter w/PONV & woke up during endoscopy" (10/05/2017)   GERD (gastroesophageal reflux disease)  Graves' disease    S/P "radioactive tx"   History of blood transfusion    "related to shoulder replacement"    History of gout    "no longer on daily RX" (10/05/2017)   History of hiatal hernia    Hyperlipidemia    Hypertension    Hypocalcemia    Hypokalemia    Hypothyroidism    Macular degeneration    Melanoma ([place]) ~ 2012   "off my back" (10/05/2017)   Myocardial infarction [facility])    "was told I'd had one; don't really know when" (10/05/2017)   OSA on CPAP    Presence of permanent cardiac 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   Scarring of lung 04/2017   "found by radiology" (10/05/2017)   Sinus node dysfunction (HCC)    Type II diabetes mellitus ([place])    Vitamin B12 deficiency    Vitamin D deficiency      Past Surgical History:  Procedure Laterality Date   APPENDECTOMY     ATRIAL FIBRILLATION ABLATION N/A 10/05/2017   Procedure: ATRIAL FIBRILLATION ABLATION;  Surgeon: Constance Haw, MD;  Location: [place] [facility];  Service: Cardiovascular;  Laterality: N/A;   ATRIAL FIBRILLATION ABLATION  ~ 2014   CARDIAC CATHETERIZATION  04/2017   COLONOSCOPY W/ BIOPSIES AND POLYPECTOMY  2015   1 polyp removed- repeat 3 years   CORONARY ANGIOPLASTY WITH STENT PLACEMENT  ~ 2014   EP IMPLANTABLE DEVICE N/A 11/10/2015   Procedure: Pacemaker Implant;  Surgeon: Will [name], MD;  Location: [place] [facility];  Service: Cardiovascular;  Laterality: N/A;   EP IMPLANTABLE DEVICE N/A 11/11/2015   Procedure: Lead Revision/Repair;  Surgeon: Evans Lance, MD;  Location: [place] [facility];  Service: Cardiovascular;  Laterality: N/A;   INGUINAL HERNIA REPAIR Left 1951   JOINT REPLACEMENT     LAPAROSCOPIC CHOLECYSTECTOMY     MELANOMA EXCISION  ~ 2012   "off my back" (10/05/2017)   PARATHYROIDECTOMY     "removed 3 glands; still have 1 gland" (10/05/2017)   RIGHT/LEFT HEART CATH AND CORONARY ANGIOGRAPHY N/A 05/03/2017   Procedure: RIGHT/LEFT HEART CATH AND CORONARY ANGIOGRAPHY;  Surgeon: Leonie Man, MD;  Location: [place] [facility];  Service: Cardiovascular;  Laterality: N/A;   TONSILLECTOMY     TOTAL SHOULDER ARTHROPLASTY Right x 2   VAGINAL HYSTERECTOMY     "total"    Current Outpatient Medications  Medication Sig Dispense Refill   carboxymethylcellulose (REFRESH PLUS) 0.5 % SOLN Place 1 drop into both eyes 3 (three) times daily as needed.     Cholecalciferol (VITAMIN D3) 5000 units TABS Take 1 tablet by mouth every morning.     diltiazem (CARDIZEM CD) 180 MG 24 hr capsule TAKE 1 CAPSULE EVERY DAY 30 capsule 11   ELIQUIS 5 MG TABS tablet TAKE ONE TABLET TWICE DAILY 180 tablet 1   insulin NPH-regular Human (70-30) 100 UNIT/ML injection Inject 29 units in the morning  before breakfast and inject 24 units in the evening before dinner 20 mL 2   Insulin Syringe-Needle U-100 (INSULIN SYRINGE .3CC/31GX5/16") 31G X 5/16" 0.3 ML MISC Use to inject insulin twice daily 200 each 3   levothyroxine (SYNTHROID, LEVOTHROID) 137 MCG tablet TAKE ONE TABLET EVERY OTHER DAY 30 tablet 2   Multiple Vitamins-Minerals (ICAPS AREDS 2) CAPS Take 1 capsule by mouth 2 (two) times daily.     nitroGLYCERIN (NITROSTAT) 0.4 MG SL tablet Place 0.4 mg under the tongue every 5 (five) minutes as needed for chest pain.  pantoprazole (PROTONIX) 40 MG tablet Take 1 tablet (40 mg total) by mouth daily. 90 tablet 3   potassium chloride (KLOR-CON) 8 MEQ tablet Take 2 tablets (16 meq) by mouth twice daily on the days you take torsemide 180 tablet 3   rosuvastatin (CRESTOR) 40 MG tablet TAKE ONE TABLET BY MOUTH EVERY DAY 90 tablet 1   Semaglutide,0.25 or 0.5MG /DOS, (OZEMPIC, 0.25 OR 0.5 MG/DOSE,) 2 MG/1.5ML SOPN Inject 0.5 mg into the skin once a week. 2 pen 5   torsemide (DEMADEX) 20 MG tablet Take 4 tablets (80 mg) by mouth once every other day 180 tablet 3   vitamin B-12 (CYANOCOBALAMIN) 500 MCG tablet Take 500 mcg by mouth daily.     No current facility-administered medications for this visit.     Allergies:   Demerol [meperidine]; Multaq [dronedarone]; Sulfa antibiotics; and Tetracyclines & related   Social History:  The patient  reports that she has quit smoking. Her smoking use included cigarettes. She has a 6.00 pack-year smoking history. She has never used smokeless tobacco. She reports current alcohol use. She reports that she does not use drugs.   Family History:  The patient's   family history includes Breast cancer (age of onset: 20) in her sister; Heart defect (age of onset: 73) in her father; Thyroid disease (age of onset: 80) in her mother.   ROS:  Please see the history of present illness.   All other systems are personally reviewed and negative.    Exam:    Vital  Signs:  BP (!) 148/88 (BP Location: Left Arm, Patient Position: Sitting, Cuff Size: Normal)    Pulse 82    Ht 5\' 3"  (1.6 m)    Wt 215 lb (97.5 kg)    BMI 38.09 kg/m     Well appearing, alert and conversant, regular work of breathing,  good skin color Eyes- anicteric, neuro- grossly intact, skin- no apparent rash or lesions or cyanosis, mouth- oral mucosa is pink   Labs/Other Tests and Data Reviewed:    Recent Labs: 03/27/2018: TSH 0.34 04/03/2018: Hemoglobin 12.9; Platelets 351 04/18/2018: BUN 16; Creatinine, Ser 1.20; Potassium 3.6; Sodium 142   Wt Readings from Last 3 Encounters:  07/03/18 215 lb (97.5 kg)  04/03/18 216 lb (98 kg)  03/27/18 212 lb (96.2 kg)     Other studies personally reviewed: Additional studies/ records that were reviewed today include: As above\  Review of the above records today demonstrates: As above  Prior radiographs:    Device interrogation 4/20 demonstrated normal device function.  Some atrial fibrillation, measured at 4.3% unlikely lower because of over sensing.  Longest episode 14 hours.  ASSESSMENT & PLAN:    Sick Sinus syndrome  Persistent Atrial Fib with prior PVI and redo 7/19  Pacemaker St Jude   OSA  P-wave oversensing resulting false detection of atrial fibrillation  HFpEF   chronic   Coronary artery disease with prior stenting   Ongoing complaints of atrial fibrillation.  Reviewing the data through 4/7 there was no significant change in arrhythmia burden over the last months.  There was indeed only one episode of atrial fibrillation lasting more than 25 minutes; other tachycardia stored under AMS were atrial tachycardia and PACs with rates from 115-250  We will arrange for another interrogation today to try to understand what is the cause of her symptoms over the last couple of weeks.  Some part of this I suspect is HFpEF  We have discussed the physiology of heart failure  including the importance of salt restriction and fluid  restriction and have reviewed sources of dietary salt and water.  We will begin her on low-dose Zaroxolyn 2.5 mg 2 times per week to augment her torsemide  Strategies for managing her atrial fibrillation I think are limited to drug.  With her weight, I do not think she is a candidate for repeat ablation.  I will need to review these data.  She has been intolerant of amiodarone and notes suggest also dronaderone.    Would anticipate using dofetilide post COVID  COVID 19 screen The patient denies symptoms of COVID 19 at this time.  The importance of social distancing was discussed today.  Follow-up:  Thursday 4/23     Current medicines are reviewed at length with the patient today.   The patient does not have concerns regarding her medicines.  The following changes were made today:  As above Zaroxolyn 2.5 mg 2 times per week as needed  Labs/ tests ordered today include:   No orders of the defined types were placed in this encounter.   Future tests ( post COVID )    in    months  Patient Risk:  after full review of this patients clinical status, I feel that they are at moderate  risk at this time.  Today, I have spent 27  minutes with the patient with telehealth technology discussing the above.  Signed, Virl Axe, MD  07/03/2018 12:02 PM     [facility] [address] [place] [address] [phone] (office) [phone] (fax)

## 2018-07-04 ENCOUNTER — Encounter: Payer: Self-pay | Admitting: Internal Medicine

## 2018-07-04 NOTE — Progress Notes (Signed)
Manual PPM transmission received on 07/03/18 and reviewed as requested by Dr. Caryl Comes. AT/AF burden 5.2%, available EGMs since last processed transmission on 06/19/18 mostly show AT/ST and PACs. Most recent true AF episodes occurred on 06/21/18 (example EGM below).

## 2018-07-05 ENCOUNTER — Telehealth: Payer: Self-pay

## 2018-07-05 ENCOUNTER — Other Ambulatory Visit: Payer: Self-pay

## 2018-07-05 ENCOUNTER — Telehealth (INDEPENDENT_AMBULATORY_CARE_PROVIDER_SITE_OTHER): Payer: Medicare Other | Admitting: Internal Medicine

## 2018-07-05 VITALS — HR 60 | Ht 63.0 in | Wt 211.0 lb

## 2018-07-05 DIAGNOSIS — R0602 Shortness of breath: Secondary | ICD-10-CM

## 2018-07-05 DIAGNOSIS — R079 Chest pain, unspecified: Secondary | ICD-10-CM

## 2018-07-05 DIAGNOSIS — I951 Orthostatic hypotension: Secondary | ICD-10-CM

## 2018-07-05 DIAGNOSIS — R002 Palpitations: Secondary | ICD-10-CM

## 2018-07-05 NOTE — Progress Notes (Signed)
Electrophysiology TeleHealth Note   Due to national recommendations of social distancing due to COVID 19, an audio/video telehealth visit is felt to be most appropriate for this patient at this time.  See MyChart message from today for the patient's consent to telehealth for [facility].   Date:  07/05/2018   ID:  Dawn Garcia, DOB 1940-11-01, MRN [id number]  Location: patient's home  Provider location: [address], [place]  Evaluation Performed: Follow-up visit  PCP:  Perrin Maltese, MD  Cardiologist:     Electrophysiologist:  SK   Chief Complaint:  tachypalpitations  History of Present Illness:    Dawn Garcia is a 78 y.o. female who presents via audio/video conferencing for a telehealth visit today.  Since last being seen in our clinic for *palpitations and dyspnea  the patient reports having had a good day yesterday but a bad day today.  She describes this as orthostatic tachycardia with lightheadedness; shortness of breath and chest discomfort accompanying the palpitations.  When she sits the symptoms abate.  She diuresed vigorously yesterday with Zaroxolyn losing 5 pounds.  This morning she feels terrible.  Interval device interrogation demonstrates a persistent low level of atrial arrhythmia, less than 10%; repeat interrogation 4/21 again demonstrates a low level of atrial arrhythmia in the 5% range.  There are PACs.  Much of her atrial arrhythmia is in the 110-130 range consistent with what we had seen previously I presume to be a slow flutter      The patient denies symptoms of fevers, chills, cough, or new SOB worrisome for COVID 19.   Past Medical History:  Diagnosis Date  . Age-related macular degeneration, dry, right eye   . Age-related macular degeneration, wet, left eye ([place])   . Arthritis    "all over; particularly in hands/fingers; all my joints" (10/05/2017)  . Atrial fibrillation ([place])   . CAD (coronary artery disease)   .  CHF (congestive heart failure) (HCC)     A-Fib  . Chronic kidney disease   . COPD (chronic obstructive pulmonary disease) ([place])    "very mild" (10/05/2017)  . Family history of adverse reaction to anesthesia    "daughter w/PONV & woke up during endoscopy" (10/05/2017)  . GERD (gastroesophageal reflux disease)   . Graves' disease    S/P "radioactive tx"  . History of blood transfusion    "related to shoulder replacement"   . History of gout    "no longer on daily RX" (10/05/2017)  . History of hiatal hernia   . Hyperlipidemia   . Hypertension   . Hypocalcemia   . Hypokalemia   . Hypothyroidism   . Macular degeneration   . Melanoma ([place]) ~ 2012   "off my back" (10/05/2017)  . Myocardial infarction [facility])    "was told I'd had one; don't really know when" (10/05/2017)  . OSA on CPAP   . 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 Sinus node dysfunction (HCC)   . Type II diabetes mellitus ([place])   . Vitamin B12 deficiency   . Vitamin D deficiency     Past Surgical History:  Procedure Laterality Date  . APPENDECTOMY    . ATRIAL FIBRILLATION ABLATION N/A 10/05/2017   Procedure: ATRIAL FIBRILLATION ABLATION;  Surgeon: Constance Haw, MD;  Location: [place] [facility];  Service: Cardiovascular;  Laterality: N/A;  . ATRIAL FIBRILLATION ABLATION  ~  2014  . CARDIAC CATHETERIZATION  04/2017  . COLONOSCOPY W/ BIOPSIES AND POLYPECTOMY  2015   1 polyp removed- repeat 3 years  . CORONARY ANGIOPLASTY WITH STENT PLACEMENT  ~ 2014  . EP IMPLANTABLE DEVICE N/A 11/10/2015   Procedure: Pacemaker Implant;  Surgeon: Will [name], MD;  Location: [place] [facility];  Service: Cardiovascular;  Laterality: N/A;  . EP IMPLANTABLE DEVICE N/A 11/11/2015   Procedure: Lead Revision/Repair;  Surgeon: Evans Lance, MD;  Location: [place] [facility];  Service: Cardiovascular;  Laterality: N/A;  . INGUINAL HERNIA REPAIR Left 1951   . JOINT REPLACEMENT    . LAPAROSCOPIC CHOLECYSTECTOMY    . MELANOMA EXCISION  ~ 2012   "off my back" (10/05/2017)  . PARATHYROIDECTOMY     "removed 3 glands; still have 1 gland" (10/05/2017)  . RIGHT/LEFT HEART CATH AND CORONARY ANGIOGRAPHY N/A 05/03/2017   Procedure: RIGHT/LEFT HEART CATH AND CORONARY ANGIOGRAPHY;  Surgeon: Leonie Man, MD;  Location: [place] [facility];  Service: Cardiovascular;  Laterality: N/A;  . TONSILLECTOMY    . TOTAL SHOULDER ARTHROPLASTY Right x 2  . VAGINAL HYSTERECTOMY     "total"    Current Outpatient Medications  Medication Sig Dispense Refill  . carboxymethylcellulose (REFRESH PLUS) 0.5 % SOLN Place 1 drop into both eyes 3 (three) times daily as needed.    . Cholecalciferol (VITAMIN D3) 5000 units TABS Take 1 tablet by mouth every morning.    . diltiazem (CARDIZEM CD) 180 MG 24 hr capsule TAKE 1 CAPSULE EVERY DAY 30 capsule 11  . ELIQUIS 5 MG TABS tablet TAKE ONE TABLET TWICE DAILY 180 tablet 1  . insulin NPH-regular Human (70-30) 100 UNIT/ML injection Inject 29 units in the morning before breakfast and inject 24 units in the evening before dinner 20 mL 2  . Insulin Syringe-Needle U-100 (INSULIN SYRINGE .3CC/31GX5/16") 31G X 5/16" 0.3 ML MISC Use to inject insulin twice daily 200 each 3  . levothyroxine (SYNTHROID, LEVOTHROID) 137 MCG tablet TAKE ONE TABLET EVERY OTHER DAY 30 tablet 2  . metolazone (ZAROXOLYN) 2.5 MG tablet Take 1 tablet (2.5 mg) by mouth twice a week, take 30 minutes prior to torsemide 10 tablet 0  . Multiple Vitamins-Minerals (ICAPS AREDS 2) CAPS Take 1 capsule by mouth 2 (two) times daily.    . nitroGLYCERIN (NITROSTAT) 0.4 MG SL tablet Place 0.4 mg under the tongue every 5 (five) minutes as needed for chest pain.    . pantoprazole (PROTONIX) 40 MG tablet Take 1 tablet (40 mg total) by mouth daily. 90 tablet 3  . potassium chloride (KLOR-CON) 8 MEQ tablet Take 2 tablets (16 meq) by mouth twice daily on the days you take torsemide 180  tablet 3  . rosuvastatin (CRESTOR) 40 MG tablet TAKE ONE TABLET BY MOUTH EVERY DAY 90 tablet 1  . Semaglutide,0.25 or 0.5MG /DOS, (OZEMPIC, 0.25 OR 0.5 MG/DOSE,) 2 MG/1.5ML SOPN Inject 0.5 mg into the skin once a week. 2 pen 5  . torsemide (DEMADEX) 20 MG tablet Take 4 tablets (80 mg) by mouth once every other day 180 tablet 3  . vitamin B-12 (CYANOCOBALAMIN) 500 MCG tablet Take 500 mcg by mouth daily.     No current facility-administered medications for this visit.     Allergies:   Demerol [meperidine]; Multaq [dronedarone]; Sulfa antibiotics; and Tetracyclines & related   Social History:  The patient  reports that she has quit smoking. Her smoking use included cigarettes. She has a 6.00 pack-year smoking history.  She has never used smokeless tobacco. She reports current alcohol use. She reports that she does not use drugs.   Family History:  The patient's   family history includes Breast cancer (age of onset: 34) in her sister; Heart defect (age of onset: 12) in her father; Thyroid disease (age of onset: 48) in her mother.   ROS:  Please see the history of present illness.   All other systems are personally reviewed and negative.    Exam:    Vital Signs:  Pulse 60   Ht 5\' 3"  (1.6 m)   Wt 211 lb (95.7 kg)   BMI 37.38 kg/m      BP HR  sitting 122/76 72  standing 90/67 95  Standing x 62min 118/85 95  Walking to kitchen 104/76 110     Well appearing, alert and conversant, regular work of breathing,  good skin color Eyes- anicteric, neuro- grossly intact, skin- no apparent rash or lesions or cyanosis, mouth- oral mucosa is pink   Labs/Other Tests and Data Reviewed:    Recent Labs: 03/27/2018: TSH 0.34 04/03/2018: Hemoglobin 12.9; Platelets 351 04/18/2018: BUN 16; Creatinine, Ser 1.20; Potassium 3.6; Sodium 142   Wt Readings from Last 3 Encounters:  07/05/18 211 lb (95.7 kg)  07/03/18 215 lb (97.5 kg)  04/03/18 216 lb (98 kg)     Other studies personally reviewed:      ASSESSMENT & PLAN:    Sick Sinus syndrome  Persistent Atrial Fib with prior PVI and redo 7/19  Pacemaker St Jude   OSA  Tachypalpitations  Chest Pain  HFpEF   chronic /Dyspne  Orthostatic hypotension  Coronary artery disease with prior stenting  Worsening symptoms today in the wake of vigorous diuresis prompts the question as to whether her upright tachypalpitations are sinus versus her known atrial flutter.  Certainly, from the orthostatic vital signs listed above this would seem to be the case.  We reviewed the physiology.  We will have her discontinue her diltiazem for rate now to allow for augmentation of her blood pressure  I will have her take no diuretics today and begin her torsemide back tomorrow but every other day.  We will touch base early next week to see how she is doing with an improved volume status.  Inclined towards thinking about using a beta-blocker if she has orthostatic tachycardia in the absence of hypotension, clearly presently not the case.  By description, not volume overloaded.  Suspect that her dyspnea is multifactorial, partly volume, probably morbid obesity, and possibly orthostatic tachycardia.  Chest pains with standing are unlikely ischemic; suspect it relates to the tachypalpitations.   COVID 19 screen The patient denies symptoms of COVID 19 at this time.  The importance of social distancing was discussed today.  Follow-up: Phone call next week    Current medicines are reviewed at length with the patient today.   The patient does not have concerns regarding her medicines.  The following changes were made today:   1 stop diltiazem 2-no diuretics today 3-change torsemide to take it every other day   Labs/ tests ordered today include:   No orders of the defined types were placed in this encounter.   Future tests ( post COVID )     Patient Risk:  after full review of this patients clinical status, I feel that they are at  moderate* * risk at this time.  Today, I have spent  17 minutes on two separate calls with the patient with  telehealth technology discussing the above.  Signed, Virl Axe, MD  07/05/2018 8:34 AM     [place] [address] [phone] (office) [phone] (fax)

## 2018-07-05 NOTE — Telephone Encounter (Signed)
I spoke with pt and explained to her that Dr. Caryl Comes have requested a transmission from her. The pt agreed to send a transmission with her home monitor today.

## 2018-07-05 NOTE — Patient Instructions (Addendum)
Dawn Garcia,  I clarified again with Dr. Caryl Comes about the diltiazem after I spoke with you by phone- he did want you to stop it until I speak with you on Monday!  Medication Instructions:  - Your physician has recommended you make the following change in your medication:   1) Stop diltiazem- until the nurse calls you on monday 2) Do not take any fluid pills today 3) Tomorrow resume torsemide 20 mg- 4 tablets (80 mg) once every other day  If you need a refill on your cardiac medications before your next appointment, please call your pharmacy.   Lab work: - none ordered  If you have labs (blood work) drawn today and your tests are completely normal, you will receive your results only by: Marland Kitchen MyChart Message (if you have MyChart) OR . A paper copy in the mail If you have any lab test that is abnormal or we need to change your treatment, we will call you to review the results.  Testing/Procedures: - none ordered  Follow-Up: At [facility], you and your health needs are our priority.  As part of our continuing mission to provide you with exceptional heart care, we have created designated Provider Care Teams.  These Care Teams include your primary Cardiologist (physician) and Advanced Practice Providers (APPs -  Physician Assistants and Nurse Practitioners) who all work together to provide you with the care you need, when you need it. Marland Kitchen Pending a phone call from the next on Monday (4/27) to follow up on how you are doing  Any Other Special Instructions Will Be Listed Below (If Applicable). - N/A

## 2018-07-09 ENCOUNTER — Ambulatory Visit: Payer: Medicare Other | Admitting: Internal Medicine

## 2018-07-09 ENCOUNTER — Telehealth: Payer: Self-pay | Admitting: *Deleted

## 2018-07-09 NOTE — Telephone Encounter (Signed)
Noted- I spoke with the patient earlier today regarding her BP readings/ weights.  She is scheduled for a follow up E-visit w/ Dr. Caryl Comes on 07/12/18.

## 2018-07-09 NOTE — Telephone Encounter (Signed)
Lets continue to hold on her diuretics At this point I suspect she is intravascularly dry and maybe will feel better w a weight about 214 Continue off dilt  Thanks

## 2018-07-09 NOTE — Telephone Encounter (Signed)
I spoke with the patient to follow up on her symptoms per her e-visit with Dr. Caryl Comes on 07/05/18.  She confirms she has been off diltiazem since 4/23, she took torsemide on Friday & Sunday. Her last dose of metolazone was Friday- 4/24  Orthostatic BP's are reported as: Sitting: 114/84 Standing: would not register, after sitting back down- 95/69  Sitting: 111/81 (80's) Standing: 81/66 (113)- 18 minutes after sitting back down- 128/89  She reported several BP's while standing: 94/74 (95) 96/65 Her lowest SBP with standing; 71  Her highest sitting BP: 142/101 standing: 79/57  Sitting HR's are typically 80-90's & standing are low 100-110's.  She complains of feelings of nausea, weakness (generalized & legs),& sweaty. She states she is (pre-COVID) was playing table top games, leading 2 bible studies, and taking care of the meds of some friends.   I asked when she felt like things really got worse for her. Per her report, when her PPM was adjusted last, she had some lightheadedness, that finally dissapated, then she developed periods of what she thought were intermittent AF associated with chest pain/ SOB.  When she called and transmitted on 4/22, this is when she felt like things got really bad for her.   Weights over the last 2 weeks have trended from 218 lbs to 209 lbs on Saturday (lowest weight), she is 210 lbs today.   She states prior to having any issues, her weight would run ~208-210 lbs.   She states she does not feel well and is really concerned. She wants Dr. Caryl Comes to tell her if she is in CHF and if he feels like she needs to go to the hospital. She states "if he <Dr. Klein>can't be my doctor in [place], I don't want to go there" She was very dissatisfied with the hospitalist that cared for her. She states the cardiologist that saw her was very arrogant.   I advised the patient I will review the above information with Dr. Caryl Comes and call her back.   She voices  understanding and is agreeable.

## 2018-07-09 NOTE — Telephone Encounter (Signed)
I spoke with Dr. Caryl Comes directly about the patient. Recommendations per MD: 1) continue to hold diuretics (no torsemide or metolazone) 2) continue to hold diltiazem 3) if weight goes to 214 lbs or above, she may take torsemide 40 mg x 1 dose 4) f/u with an E-visit on 07/12/18  The patient has been made aware of the above recommendations. She voices understanding and is agreeable. I have advised her to call back prior to Thursday if needed.   E-visit appt- 07/12/18 at 2:00 pm.

## 2018-07-09 NOTE — Telephone Encounter (Signed)
-----   Message from Emily Filbert, RN sent at 07/05/2018 11:01 AM EDT ----- Call her Monday early afternoon to see how she is feeling. F/u w/ SK is pending

## 2018-07-09 NOTE — Telephone Encounter (Signed)
Normal device function, around 5% AF burden, stable. Presenting rhythm SR.   Chanetta Marshall, NP 07/09/2018 3:57 PM

## 2018-07-09 NOTE — Telephone Encounter (Signed)
Transmission sent 07/05/2018 at 2:11 pm

## 2018-07-12 ENCOUNTER — Other Ambulatory Visit: Payer: Self-pay

## 2018-07-12 ENCOUNTER — Telehealth (INDEPENDENT_AMBULATORY_CARE_PROVIDER_SITE_OTHER): Payer: Medicare Other | Admitting: Internal Medicine

## 2018-07-12 VITALS — Ht 63.0 in | Wt 216.0 lb

## 2018-07-12 DIAGNOSIS — Z950 Presence of cardiac pacemaker: Secondary | ICD-10-CM

## 2018-07-12 DIAGNOSIS — I4819 Other persistent atrial fibrillation: Secondary | ICD-10-CM

## 2018-07-12 DIAGNOSIS — I480 Paroxysmal atrial fibrillation: Secondary | ICD-10-CM

## 2018-07-12 DIAGNOSIS — I495 Sick sinus syndrome: Secondary | ICD-10-CM

## 2018-07-12 DIAGNOSIS — I442 Atrioventricular block, complete: Secondary | ICD-10-CM

## 2018-07-12 DIAGNOSIS — I951 Orthostatic hypotension: Secondary | ICD-10-CM

## 2018-07-12 NOTE — Patient Instructions (Addendum)
Hello Dawn Garcia! I will call you in about 10 days to follow up with how you are doing.  Other instructions as below, if you have any questions, please let me know- thank you! Heather,RN  Medication Instructions:  - Your physician recommends that you continue on your current medications as directed. Please refer to the Current Medication list given to you today.  If you need a refill on your cardiac medications before your next appointment, please call your pharmacy.   Lab work: - none ordered  If you have labs (blood work) drawn today and your tests are completely normal, you will receive your results only by: Marland Kitchen MyChart Message (if you have MyChart) OR . A paper copy in the mail If you have any lab test that is abnormal or we need to change your treatment, we will call you to review the results.  Testing/Procedures: - none ordered  Follow-Up: At [facility], you and your health needs are our priority.  As part of our continuing mission to provide you with exceptional heart care, we have created designated Provider Care Teams.  These Care Teams include your primary Cardiologist (physician) and Advanced Practice Providers (APPs -  Physician Assistants and Nurse Practitioners) who all work together to provide you with the care you need, when you need it.  . in 10 days with a phone call from the nurse  Any Other Special Instructions Will Be Listed Below (If Applicable).  - New target weight is 216 lbs - please wear an abdominal binder/ thigh sleeves during your waking hours to try to help with blood pressure support (these can be found on [employer])

## 2018-07-12 NOTE — Progress Notes (Signed)
Electrophysiology TeleHealth Note   Due to national recommendations of social distancing due to COVID 19, an audio/video telehealth visit is felt to be most appropriate for this patient at this time.  See MyChart message from today for the patient's consent to telehealth for [facility].   Date:  07/12/2018   ID:  Dawn Garcia, DOB 05-31-1940, MRN [id number]  Location: patient's home  Provider location: [address], [place]  Evaluation Performed: Follow-up visit  PCP:  Perrin Maltese, MD    Electrophysiologist:  SK   Chief Complaint:  Orthostatic tachycardia   History of Present Illness:    Dawn Garcia is a 78 y.o. female who presents via audio/video conferencing for a telehealth visit today.  Since last being seen in our clinic for orthostatic dyspnea and lightheadedness finally identified as OI  the patient reports feeling much better with less dyspnea but still fatigued  Weight is 214 and took first dose of diuretic in few days    No new meds; has long standing diabetes with peripheral neuropathy   The patient denies symptoms of fevers, chills, cough, or new SOB worrisome for COVID 19.   Past Medical History:  Diagnosis Date  . Age-related macular degeneration, dry, right eye   . Age-related macular degeneration, wet, left eye ([place])   . Arthritis    "all over; particularly in hands/fingers; all my joints" (10/05/2017)  . Atrial fibrillation ([place])   . CAD (coronary artery disease)   . CHF (congestive heart failure) (HCC)     A-Fib  . Chronic kidney disease   . COPD (chronic obstructive pulmonary disease) ([place])    "very mild" (10/05/2017)  . Family history of adverse reaction to anesthesia    "daughter w/PONV & woke up during endoscopy" (10/05/2017)  . GERD (gastroesophageal reflux disease)   . Graves' disease    S/P "radioactive tx"  . History of blood transfusion    "related to shoulder replacement"   . History of gout    "no  longer on daily RX" (10/05/2017)  . History of hiatal hernia   . Hyperlipidemia   . Hypertension   . Hypocalcemia   . Hypokalemia   . Hypothyroidism   . Macular degeneration   . Melanoma ([place]) ~ 2012   "off my back" (10/05/2017)  . Myocardial infarction [facility])    "was told I'd had one; don't really know when" (10/05/2017)  . OSA on CPAP   . 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 Sinus node dysfunction (HCC)   . Type II diabetes mellitus ([place])   . Vitamin B12 deficiency   . Vitamin D deficiency     Past Surgical History:  Procedure Laterality Date  . APPENDECTOMY    . ATRIAL FIBRILLATION ABLATION N/A 10/05/2017   Procedure: ATRIAL FIBRILLATION ABLATION;  Surgeon: Constance Haw, MD;  Location: [facility];  Service: Cardiovascular;  Laterality: N/A;  . ATRIAL FIBRILLATION ABLATION  ~ 2014  . CARDIAC CATHETERIZATION  04/2017  . COLONOSCOPY W/ BIOPSIES AND POLYPECTOMY  2015   1 polyp removed- repeat 3 years  . CORONARY ANGIOPLASTY WITH STENT PLACEMENT  ~ 2014  . EP IMPLANTABLE DEVICE N/A 11/10/2015   Procedure: Pacemaker Implant;  Surgeon: Will [name], MD;  Location: [facility];  Service: Cardiovascular;  Laterality: N/A;  . EP IMPLANTABLE DEVICE N/A 11/11/2015   Procedure: Lead  Revision/Repair;  Surgeon: Evans Lance, MD;  Location: [facility];  Service: Cardiovascular;  Laterality: N/A;  . INGUINAL HERNIA REPAIR Left 1951  . JOINT REPLACEMENT    . LAPAROSCOPIC CHOLECYSTECTOMY    . MELANOMA EXCISION  ~ 2012   "off my back" (10/05/2017)  . PARATHYROIDECTOMY     "removed 3 glands; still have 1 gland" (10/05/2017)  . RIGHT/LEFT HEART CATH AND CORONARY ANGIOGRAPHY N/A 05/03/2017   Procedure: RIGHT/LEFT HEART CATH AND CORONARY ANGIOGRAPHY;  Surgeon: Leonie Man, MD;  Location: [facility];  Service: Cardiovascular;  Laterality: N/A;  . TONSILLECTOMY    . TOTAL  SHOULDER ARTHROPLASTY Right x 2  . VAGINAL HYSTERECTOMY     "total"    Current Outpatient Medications  Medication Sig Dispense Refill  . carboxymethylcellulose (REFRESH PLUS) 0.5 % SOLN Place 1 drop into both eyes 3 (three) times daily as needed.    . Cholecalciferol (VITAMIN D3) 5000 units TABS Take 1 tablet by mouth every morning.    . diltiazem (CARDIZEM CD) 180 MG 24 hr capsule TAKE 1 CAPSULE EVERY DAY 30 capsule 11  . ELIQUIS 5 MG TABS tablet TAKE ONE TABLET TWICE DAILY 180 tablet 1  . insulin NPH-regular Human (70-30) 100 UNIT/ML injection Inject 29 units in the morning before breakfast and inject 24 units in the evening before dinner 20 mL 2  . Insulin Syringe-Needle U-100 (INSULIN SYRINGE .3CC/31GX5/16") 31G X 5/16" 0.3 ML MISC Use to inject insulin twice daily 200 each 3  . levothyroxine (SYNTHROID, LEVOTHROID) 137 MCG tablet TAKE ONE TABLET EVERY OTHER DAY 30 tablet 2  . metolazone (ZAROXOLYN) 2.5 MG tablet Take 1 tablet (2.5 mg) by mouth twice a week, take 30 minutes prior to torsemide 10 tablet 0  . Multiple Vitamins-Minerals (ICAPS AREDS 2) CAPS Take 1 capsule by mouth 2 (two) times daily.    . nitroGLYCERIN (NITROSTAT) 0.4 MG SL tablet Place 0.4 mg under the tongue every 5 (five) minutes as needed for chest pain.    . pantoprazole (PROTONIX) 40 MG tablet Take 1 tablet (40 mg total) by mouth daily. 90 tablet 3  . potassium chloride (KLOR-CON) 8 MEQ tablet Take 2 tablets (16 meq) by mouth twice daily on the days you take torsemide 180 tablet 3  . rosuvastatin (CRESTOR) 40 MG tablet TAKE ONE TABLET BY MOUTH EVERY DAY 90 tablet 1  . Semaglutide,0.25 or 0.5MG /DOS, (OZEMPIC, 0.25 OR 0.5 MG/DOSE,) 2 MG/1.5ML SOPN Inject 0.5 mg into the skin once a week. 2 pen 5  . torsemide (DEMADEX) 20 MG tablet Take 4 tablets (80 mg) by mouth once every other day 180 tablet 3  . vitamin B-12 (CYANOCOBALAMIN) 500 MCG tablet Take 500 mcg by mouth daily.     No current facility-administered medications  for this visit.     Allergies:   Demerol [meperidine]; Multaq [dronedarone]; Sulfa antibiotics; and Tetracyclines & related   Social History:  The patient  reports that she has quit smoking. Her smoking use included cigarettes. She has a 6.00 pack-year smoking history. She has never used smokeless tobacco. She reports current alcohol use. She reports that she does not use drugs.   Family History:  The patient's   family history includes Breast cancer (age of onset: 73) in her sister; Heart defect (age of onset: 3) in her father; Thyroid disease (age of onset: 62) in her mother.   ROS:  Please see the history of present illness.   All other systems are  personally reviewed and negative.    Exam:    Vital Signs:  BP 123/81   Pulse 81   Ht 5\' 3"  (1.6 m)   Wt 216 lb (98 kg)   BMI 38.26 kg/m      BP HR  Sitting  123/81 81  Stood 86/65 98  Stand x 21m 123/80 93    Well appearing, alert and conversant, regular work of breathing,  good skin color Eyes- anicteric, neuro- grossly intact, skin- no apparent rash or lesions or cyanosis, mouth- oral mucosa is pink   Labs/Other Tests and Data Reviewed:    Recent Labs: 03/27/2018: TSH 0.34 04/03/2018: Hemoglobin 12.9; Platelets 351 04/18/2018: BUN 16; Creatinine, Ser 1.20; Potassium 3.6; Sodium 142   Wt Readings from Last 3 Encounters:  07/12/18 216 lb (98 kg)  07/05/18 211 lb (95.7 kg)  07/03/18 215 lb (97.5 kg)     Other studies personally reviewed: Additional studies/ records that were reviewed today include:        ASSESSMENT & PLAN:    Sick Sinus syndrome  Persistent Atrial Fib with prior PVI and redo 7/19  Pacemaker St [employer]   OSA  HFpEF  Orthostatic hypotension  Coronary artery disease with prior stenting*   She is significantly improved following discontinuation of her diuretics and diltiazem.  She still has notable fall and systolic blood pressure with standing although this may be aggravated by the fact she  took a diuretic today.  We will repeat target her dry weight at 216 and allow her weight to come up a little bit more.  I discussed with her the challenge of competing with propensity towards HFpEF and dyspnea.  To try to navigate that space between Scylla and Charybdis will try compressive wear, suggesting abdominal binder and thigh sleeves.  If need be, will try pharmacological therapy with ProAmatine.  Would like to be able to avoid more drugs in this lady with polypharmacy already.  Without symptoms of ischemia  No intercurrent atrial fibrillation or flutter   COVID 19 screen The patient denies symptoms of COVID 19 at this time.  The importance of social distancing was discussed today.  Follow-up: by phone in 10 days    Current medicines are reviewed at length with the patient today.   The patient has concerns regarding her medicines.  The following changes were made today:  As above   Labs/ tests ordered today include: none No orders of the defined types were placed in this encounter.      Patient Risk:  after full review of this patients clinical status, I feel that they are at moderate  risk at this time.  Today, I have spent 13* minutes with the patient with telehealth technology discussing the above.  Signed, Virl Axe, MD  07/12/2018 2:20 PM     [place] [address] [phone] (office) [phone] (fax)

## 2018-07-19 ENCOUNTER — Telehealth: Payer: Self-pay | Admitting: Cardiovascular Disease

## 2018-07-19 NOTE — Telephone Encounter (Signed)
Patient states she only sees Dawn Garcia now.  Per request deleting recall

## 2018-07-26 ENCOUNTER — Telehealth: Payer: Self-pay | Admitting: Internal Medicine

## 2018-07-26 NOTE — Telephone Encounter (Signed)
Pt c/o BP issue: STAT if pt c/o blurred vision, one-sided weakness or slurred speech  1. What are your last 5 BP readings? 162/77 HR 58 sitting       108/69 HR 62 standing   2. Are you having any other symptoms (ex. Dizziness, headache, blurred vision, passed out)? See previous note patient says med change has not helped  3. What is your BP issue? Patient reporting bp log and no changes from last call no improvement seen

## 2018-07-27 MED ORDER — MIDODRINE HCL 2.5 MG PO TABS: 2.5000 mg | ORAL_TABLET | Freq: Three times a day (TID) | ORAL | 6 refills | Status: DC

## 2018-07-27 MED ORDER — MIDODRINE HCL 2.5 MG PO TABS: ORAL_TABLET | ORAL | 6 refills | Status: DC

## 2018-07-27 NOTE — Telephone Encounter (Signed)
Call from Dr. Caryl Comes. Verbal order for Midodrine 2.5 mg every 4 hours during waking hours. Increase po fluids as needed for dizziness.   Call to patient to advise patient per instructions from Dr. Caryl Comes.  She verbalized understanding and had no further questions at this time.   Advised pt to call for any further questions or concerns.

## 2018-07-27 NOTE — Telephone Encounter (Signed)
Attempted to call patient. LMTCB 07/27/2018

## 2018-07-27 NOTE — Telephone Encounter (Signed)
Call to pt for information requested by Dr. Caryl Comes.  Pt reports she is taking Torsemide as indicated for weight gain as prescribed. Last taken yesterday. Pt usually takes 1 every 3-4 days PRN.  She is still hold diltiazem and metolazone per Dr. Aquilla Hacker request.  She is trying to stay hydrated.  Is having some swelling in ankles but improves after Torsemide.   She reports inability to perform normal daily tasks d/t sx.   Pt inquiring about medication that would help with hypotension that Dr. Caryl Comes had mentioned at last e visit.   Routed to Dr. Caryl Comes to further advise.

## 2018-07-27 NOTE — Telephone Encounter (Signed)
Dawn Garcia,  Thanks for your help  She remains orthostatic; what is she doing   with her diuretics.and other meds thsn

## 2018-07-27 NOTE — Telephone Encounter (Signed)
Incoming call returned from patient. Pt had e visit on 4/30 for orthostatic hypotension. Dr.Klein recommended she purchase abdominal and thigh binders which did from [employer].  She received them last week and wore one day. She felt they did not work, so she returned them to [place].   BP today sitting 152/77, HR 56 Standing 108/59. HR 62 HR increases as she is standing to 100, 97, 94. Pt reports that HR stays elevated as long as she is standing. She reports chest pain, SOB, lightheadedness and weakness while standing. She is scared to shower d/t weakness.  Routed to provider to advise.

## 2018-07-29 ENCOUNTER — Other Ambulatory Visit: Payer: Self-pay | Admitting: Cardiovascular Disease

## 2018-07-29 ENCOUNTER — Other Ambulatory Visit: Payer: Self-pay | Admitting: Internal Medicine

## 2018-07-30 ENCOUNTER — Telehealth: Payer: Self-pay | Admitting: Internal Medicine

## 2018-07-30 NOTE — Telephone Encounter (Signed)
Patient calling with an update on midodrine States she is doing better, BP has been running higher 159/81 150/82 145/78 If it drops it is not too low Pulse has been ranging from ~50s to 70s The only side effect noticed is head itching Please call to discuss

## 2018-07-30 NOTE — Telephone Encounter (Signed)
Please advise if ok to refill Crestor 40 mg qd. Originally filled by [facility] pt request to be seen by Dr.Klein only.

## 2018-07-30 NOTE — Telephone Encounter (Signed)
Call to patient, she called to give updape on vs as requested.  Pt reports sx are much improved since she started midodrine. She is able to shower w/o difficulty and even able to take her dog for a walk.   5/16 sitting 150/82, HR 55 Standing 125/70 HR 56 After shower 134/79, HR 77  5/17 sitting159/81, standing 146/73   She does find that some lightheaded and SOB occur near the 4 hr mark when medication is wearing off. For the most part, sx are much improved.  She did suggest scalp itchiness, but felt she could tolerate that.   Advised pt to call for any further questions or concerns.   Routed to provider as Juluis Rainier.

## 2018-07-30 NOTE — Telephone Encounter (Signed)
Good job and again your thinking through things last week was very IMPRESSIVE  Maybe you need the CCCs

## 2018-08-08 ENCOUNTER — Telehealth: Payer: Self-pay | Admitting: Cardiovascular Disease

## 2018-08-08 MED ORDER — MIDODRINE HCL 2.5 MG PO TABS: ORAL_TABLET | ORAL | 6 refills | Status: DC

## 2018-08-08 NOTE — Telephone Encounter (Signed)
Quantity for midodrine 2.5 mg every 4 hours daily while awake, updated and sent to the pt pharmacy #[id number] R-6.

## 2018-08-08 NOTE — Telephone Encounter (Signed)
Please call pharmacy regarding Midodrine. States rx is written for quantity of 30.

## 2018-08-09 ENCOUNTER — Telehealth: Payer: Self-pay

## 2018-08-09 NOTE — Telephone Encounter (Signed)
LOV 07/02/18. Per Dr. Cruzita Lederer, return in 3 months with your sugar log. Called pt to schedule appt. LVM requesting returned call.

## 2018-08-13 DIAGNOSIS — H353221 Exudative age-related macular degeneration, left eye, with active choroidal neovascularization: Secondary | ICD-10-CM | POA: Diagnosis not present

## 2018-08-13 DIAGNOSIS — H353112 Nonexudative age-related macular degeneration, right eye, intermediate dry stage: Secondary | ICD-10-CM | POA: Diagnosis not present

## 2018-08-14 ENCOUNTER — Other Ambulatory Visit: Payer: Self-pay | Admitting: Family

## 2018-08-14 DIAGNOSIS — G4733 Obstructive sleep apnea (adult) (pediatric): Secondary | ICD-10-CM | POA: Diagnosis not present

## 2018-08-14 DIAGNOSIS — E559 Vitamin D deficiency, unspecified: Secondary | ICD-10-CM | POA: Diagnosis not present

## 2018-08-14 DIAGNOSIS — E039 Hypothyroidism, unspecified: Secondary | ICD-10-CM | POA: Diagnosis not present

## 2018-08-14 DIAGNOSIS — G3184 Mild cognitive impairment, so stated: Secondary | ICD-10-CM | POA: Diagnosis not present

## 2018-08-14 DIAGNOSIS — E782 Mixed hyperlipidemia: Secondary | ICD-10-CM | POA: Diagnosis not present

## 2018-08-14 DIAGNOSIS — I509 Heart failure, unspecified: Secondary | ICD-10-CM | POA: Diagnosis not present

## 2018-08-14 DIAGNOSIS — I4891 Unspecified atrial fibrillation: Secondary | ICD-10-CM | POA: Diagnosis not present

## 2018-08-14 DIAGNOSIS — G2581 Restless legs syndrome: Secondary | ICD-10-CM | POA: Diagnosis not present

## 2018-08-14 DIAGNOSIS — R7302 Impaired glucose tolerance (oral): Secondary | ICD-10-CM | POA: Diagnosis not present

## 2018-08-14 DIAGNOSIS — E1165 Type 2 diabetes mellitus with hyperglycemia: Secondary | ICD-10-CM | POA: Diagnosis not present

## 2018-08-14 DIAGNOSIS — Z1231 Encounter for screening mammogram for malignant neoplasm of breast: Secondary | ICD-10-CM

## 2018-08-14 DIAGNOSIS — Z1159 Encounter for screening for other viral diseases: Secondary | ICD-10-CM | POA: Diagnosis not present

## 2018-08-20 ENCOUNTER — Encounter: Payer: Self-pay | Admitting: Internal Medicine

## 2018-08-20 DIAGNOSIS — Z1212 Encounter for screening for malignant neoplasm of rectum: Secondary | ICD-10-CM | POA: Diagnosis not present

## 2018-08-20 DIAGNOSIS — Z1211 Encounter for screening for malignant neoplasm of colon: Secondary | ICD-10-CM | POA: Diagnosis not present

## 2018-08-22 ENCOUNTER — Encounter: Payer: Self-pay | Admitting: Internal Medicine

## 2018-08-22 NOTE — Progress Notes (Signed)
Received labs from [facility] from 08/14/2018: HbA1c 8.0%, improved from: Lab Results  Component Value Date   HGBA1C 8.5 (A) 06/27/2018  CMP normal with the following exceptions: glucose of 141, BUN/creatinine 11/1.0, GFR 54, alkaline phosphatase 208 (39-117) CBC with differential normal Vitamin D level 80.7 Vitamin B12 level >2000 SARS-CoV-2 antibody, IgM negative, IgG negative

## 2018-08-27 ENCOUNTER — Other Ambulatory Visit: Payer: Self-pay | Admitting: Internal Medicine

## 2018-08-28 ENCOUNTER — Other Ambulatory Visit: Payer: Self-pay | Admitting: Internal Medicine

## 2018-08-28 ENCOUNTER — Other Ambulatory Visit: Payer: Self-pay

## 2018-08-28 ENCOUNTER — Telehealth: Payer: Self-pay | Admitting: Internal Medicine

## 2018-08-28 MED ORDER — LEVOTHYROXINE SODIUM 137 MCG PO TABS: ORAL_TABLET | ORAL | 2 refills | Status: DC

## 2018-08-28 NOTE — Telephone Encounter (Signed)
I spoke with pt this am she mentioned that she is currently not taking metolazone 2.5 mg tablet at this time due to hydration issues. Previous telephone notes on file from where she was told to hold metolazone. Please advise if ok to refuse Rx.

## 2018-08-28 NOTE — Telephone Encounter (Signed)
I would be happy to take this patient.  She needs an office visit because she takes a blood thinner.  I have an opening Thursday I believe if she wants that.

## 2018-08-28 NOTE — Telephone Encounter (Signed)
Dr. Carlean Purl, pt requested to transfer care from [facility] because pt prefers Miralax prep soln.  Pt was referred due to positive Cologuard; pt has a son with colon cancer--diagnosed at age 78 and grandmother died of colon cancer in her late 16s.  Records are in Epic for review.  Will you accept this pt?

## 2018-08-29 NOTE — Telephone Encounter (Signed)
Message left on VM for pt to call back to schedule.

## 2018-09-03 ENCOUNTER — Encounter: Payer: Self-pay | Admitting: Internal Medicine

## 2018-09-03 DIAGNOSIS — J439 Emphysema, unspecified: Secondary | ICD-10-CM | POA: Diagnosis not present

## 2018-09-03 DIAGNOSIS — G4733 Obstructive sleep apnea (adult) (pediatric): Secondary | ICD-10-CM | POA: Diagnosis not present

## 2018-09-03 NOTE — Telephone Encounter (Signed)
Please review and advise.

## 2018-09-04 ENCOUNTER — Telehealth (HOSPITAL_COMMUNITY): Payer: Self-pay | Admitting: *Deleted

## 2018-09-04 MED ORDER — DILTIAZEM HCL 30 MG PO TABS: ORAL_TABLET | ORAL | 1 refills | Status: DC

## 2018-09-04 NOTE — Telephone Encounter (Signed)
Transmission received 09-04-2018

## 2018-09-04 NOTE — Telephone Encounter (Signed)
Dawn seiler Np looked at device report confirms afib. Discussed with Dawn Palau Np will try 30mg  of cardizem PRN for afib and see if this will break her. She is NOT currently on cardizem 180mg  a day this was taken off her med list by Dr. Caryl Comes - if still in AF tomorrow she will call in for appt in clinic.

## 2018-09-04 NOTE — Telephone Encounter (Signed)
Patient called in stating this morning when she woke up she was not feeling well. Her HR has been more elevated and "all over the place"  Short of breath, chest tightness which she normally has when her HR is up, lightheadedness. BP with sitting 146/81 HR 89 with standing 109/63 HR 87 She is going to send a remote transmission to see what rhythm she is in.

## 2018-09-17 ENCOUNTER — Encounter: Payer: Self-pay | Admitting: General Surgery

## 2018-09-17 DIAGNOSIS — G2581 Restless legs syndrome: Secondary | ICD-10-CM | POA: Diagnosis not present

## 2018-09-17 DIAGNOSIS — E119 Type 2 diabetes mellitus without complications: Secondary | ICD-10-CM | POA: Diagnosis not present

## 2018-09-17 DIAGNOSIS — G4733 Obstructive sleep apnea (adult) (pediatric): Secondary | ICD-10-CM | POA: Diagnosis not present

## 2018-09-17 DIAGNOSIS — I2510 Atherosclerotic heart disease of native coronary artery without angina pectoris: Secondary | ICD-10-CM | POA: Diagnosis not present

## 2018-09-17 DIAGNOSIS — I10 Essential (primary) hypertension: Secondary | ICD-10-CM | POA: Diagnosis not present

## 2018-09-17 DIAGNOSIS — E782 Mixed hyperlipidemia: Secondary | ICD-10-CM | POA: Diagnosis not present

## 2018-09-17 NOTE — Progress Notes (Signed)
CC: +cologuard-fm hx of gi cancer

## 2018-09-18 ENCOUNTER — Ambulatory Visit: Payer: Medicare Other | Admitting: Internal Medicine

## 2018-09-18 ENCOUNTER — Ambulatory Visit (INDEPENDENT_AMBULATORY_CARE_PROVIDER_SITE_OTHER): Payer: Medicare Other | Admitting: Internal Medicine

## 2018-09-18 ENCOUNTER — Other Ambulatory Visit: Payer: Self-pay

## 2018-09-18 ENCOUNTER — Encounter: Payer: Self-pay | Admitting: Internal Medicine

## 2018-09-18 ENCOUNTER — Ambulatory Visit (INDEPENDENT_AMBULATORY_CARE_PROVIDER_SITE_OTHER): Payer: Medicare Other | Admitting: *Deleted

## 2018-09-18 VITALS — Ht 63.0 in | Wt 214.0 lb

## 2018-09-18 DIAGNOSIS — Z7901 Long term (current) use of anticoagulants: Secondary | ICD-10-CM | POA: Diagnosis not present

## 2018-09-18 DIAGNOSIS — I951 Orthostatic hypotension: Secondary | ICD-10-CM

## 2018-09-18 DIAGNOSIS — Z800 Family history of malignant neoplasm of digestive organs: Secondary | ICD-10-CM | POA: Diagnosis not present

## 2018-09-18 DIAGNOSIS — R195 Other fecal abnormalities: Secondary | ICD-10-CM | POA: Diagnosis not present

## 2018-09-18 DIAGNOSIS — I495 Sick sinus syndrome: Secondary | ICD-10-CM | POA: Diagnosis not present

## 2018-09-18 DIAGNOSIS — I4891 Unspecified atrial fibrillation: Secondary | ICD-10-CM | POA: Diagnosis not present

## 2018-09-18 LAB — CUP PACEART REMOTE DEVICE CHECK
Battery Remaining Longevity: 132 mo
Battery Remaining Percentage: 95.5 %
Battery Voltage: 3.02 V
Brady Statistic AP VP Percent: 10 %
Brady Statistic AP VS Percent: 6.8 %
Brady Statistic AS VP Percent: 3.4 %
Brady Statistic AS VS Percent: 79 %
Brady Statistic RA Percent Paced: 16 %
Brady Statistic RV Percent Paced: 14 %
Date Time Interrogation Session: 20200707081054
Implantable Lead Implant Date: 20170829
Implantable Lead Implant Date: 20170829
Implantable Lead Location: 753859
Implantable Lead Location: 753860
Implantable Pulse Generator Implant Date: 20170829
Lead Channel Impedance Value: 430 Ohm
Lead Channel Impedance Value: 610 Ohm
Lead Channel Pacing Threshold Amplitude: 1 V
Lead Channel Pacing Threshold Amplitude: 1 V
Lead Channel Pacing Threshold Pulse Width: 0.4 ms
Lead Channel Pacing Threshold Pulse Width: 0.4 ms
Lead Channel Sensing Intrinsic Amplitude: 12 mV
Lead Channel Sensing Intrinsic Amplitude: 5 mV
Lead Channel Setting Pacing Amplitude: 2 V
Lead Channel Setting Pacing Amplitude: 2.5 V
Lead Channel Setting Pacing Pulse Width: 0.4 ms
Lead Channel Setting Sensing Sensitivity: 2 mV
Pulse Gen Model: 2272
Pulse Gen Serial Number: 7945290

## 2018-09-18 NOTE — Progress Notes (Signed)
TELEHEALTH ENCOUNTER IN SETTING OF COVID-19 PANDEMIC - REQUESTED BY PATIENT SERVICE PROVIDED BY TELEMEDECINE - TYPE: telephone PATIENT LOCATION: home PATIENT HAS CONSENTED TO TELEHEALTH VISIT PROVIDER LOCATION: OFFICE REFERRING PROVIDER:Khan, Neelam S, MD and self PARTICIPANTS OTHER THAN PATIENT:none TIME SPENT ON CALL:17 mins   Dawn Garcia 78 y.o. [birth year] [id number]  Assessment & Plan:   Encounter Diagnoses  Name Primary?  . Positive colorectal cancer screening using Cologuard test Yes  . Long term (current) use of anticoagulants - Eliquis   . Atrial fibrillation, unspecified type ([name])   . Orthostatic hypotension   . Family history of rectal cancer    Colonoscopy is necessary to investigate the positive Cologuard test.  She actually has a history of colon polyps in the past though I do not have the exact pathology.  Family history of rectal cancer in her son.  Family history of colon cancer in a paternal grandmother who was elderly.  However I need to clarify holding her anticoagulation, I think it is reasonable just to hold her Eliquis 2 days before and restart the day of or the day after the colonoscopy.  She has a little bit of concern because she knows some people that had perioperative strokes off their anticoagulation.  I do not think she meets criteria to need a Lovenox bridge.  In addition she has this orthostatic hypotension issue and I suppose it could be possible she requires IV hydration during her prep to avoid problems though I do not believe she has had frank syncope.  I will ask Dr. Caryl Comes of cardiology his thoughts on this and appreciate his help.  Once I have this information and guidance we will finalize an appointment and location of service and periprocedural management.  The risks and benefits as well as alternatives of endoscopic procedure(s) have been discussed and reviewed. All questions answered. The patient agrees to proceed. She is aware that  there is a chance of contracting coronavirus ours to through medical procedures and interactions with healthcare personnel.  She is also aware of the rare but real risk of stroke off her anticoagulation.  I appreciate the opportunity to care for this patient. 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  She had a colonoscopy in 2015 in [place] and had a polyp apparently, she was trying to avoid having another colonoscopy if she could, and ended up having a positive Cologuard.  She takes Eliquis because of a history of A. fib.  There is a remote history of a stroke on imaging she says but not a clinical syndrome associated with that it was picked up after the fact.  She is having problems with orthostatic hypotension which is a lot better with modification of her diuretic regimen and treatment with Midrin.  She denies any active GI symptoms though she occasionally has hemorrhoidal bleeding which she has had for years and that is stable with some bright red blood. Allergies  Allergen Reactions  . Demerol [Meperidine] Anaphylaxis    Tolerated Fentanyl 11/10/15  . Multaq [Dronedarone] Diarrhea  . Sulfa Antibiotics Anaphylaxis  . Tetracyclines & Related Other (See Comments) and Swelling    Made nose, lips,  And tongue itchy Made nose, lips,  And tongue itchy   Current Meds  Medication Sig  . carboxymethylcellulose (REFRESH PLUS) 0.5 %  SOLN Place 1 drop into both eyes 3 (three) times daily as needed.  . Cholecalciferol (VITAMIN D3) 5000 units TABS Take 1 tablet by mouth every morning.  . diltiazem (CARDIZEM CD) 180 MG 24 hr capsule TAKE 1 CAPSULE EVERY DAY  . diltiazem (CARDIZEM) 30 MG tablet Take 1 tablet every 4-6 hours AS NEEDED for AFIB  . ELIQUIS 5 MG TABS tablet TAKE ONE TABLET TWICE DAILY  . insulin NPH-regular Human  (70-30) 100 UNIT/ML injection Inject 29 units in the morning before breakfast and inject 24 units in the evening before dinner  . Insulin Syringe-Needle U-100 (INSULIN SYRINGE .3CC/31GX5/16") 31G X 5/16" 0.3 ML MISC Use to inject insulin twice daily  . levothyroxine (SYNTHROID) 137 MCG tablet TAKE ONE TABLET EVERY OTHER DAY  . metolazone (ZAROXOLYN) 2.5 MG tablet TAKE ONE TABLET TWICE A WEEK (TAKE 30 MINUTES PRIOR TO TORSEMIDE)  . midodrine (PROAMATINE) 2.5 MG tablet Take 1 tablet (2.5 mg total) every four hours during waking hours.  . Multiple Vitamins-Minerals (ICAPS AREDS 2) CAPS Take 1 capsule by mouth 2 (two) times daily.  . nitroGLYCERIN (NITROSTAT) 0.4 MG SL tablet Place 0.4 mg under the tongue every 5 (five) minutes as needed for chest pain.  . pantoprazole (PROTONIX) 40 MG tablet Take 1 tablet (40 mg total) by mouth daily.  . potassium chloride (KLOR-CON) 8 MEQ tablet TAKE TWO TABLETS TWICE A DAY ON THE DAYSYOU TAKE TORSEMIDE  . rosuvastatin (CRESTOR) 40 MG tablet TAKE ONE TABLET BY MOUTH EVERY DAY  . Semaglutide,0.25 or 0.5MG /DOS, (OZEMPIC, 0.25 OR 0.5 MG/DOSE,) 2 MG/1.5ML SOPN Inject 0.5 mg into the skin once a week.  . torsemide (DEMADEX) 20 MG tablet Take 4 tablets (80 mg) by mouth once every other day  . vitamin B-12 (CYANOCOBALAMIN) 500 MCG tablet Take 500 mcg by mouth daily.   Past Medical History:  Diagnosis Date  . Age-related macular degeneration, dry, right eye   . Age-related macular degeneration, wet, left eye ([place])   . Arthritis    "all over; particularly in hands/fingers; all my joints" (10/05/2017)  . Atrial fibrillation ([place])   . CAD (coronary artery disease)   . CHF (congestive heart failure) (H[facility])     A-Fib  . Chronic kidney disease   . COPD (chronic obstructive pulmonary disease) ([place])    "very mild" (10/05/2017)  . Family history of adverse reaction to anesthesia    "daughter w/PONV & woke up during endoscopy" (10/05/2017)  . GERD (gastroesophageal reflux  disease)   . Graves' disease    S/P "radioactive tx"  . History of blood transfusion    "related to shoulder replacement"   . History of gout    "no longer on daily RX" (10/05/2017)  . History of hiatal hernia   . Hyperlipidemia   . Hypertension   . Hypocalcemia   . Hypokalemia   . Hypothyroidism   . Macular degeneration   . Melanoma ([place]) ~ 2012   "off my back" (10/05/2017)  . Myocardial infarction [facility])    "was told I'd had one; don't really know when" (10/05/2017)  . OSA on CPAP   . 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 Sinus node dysfunction (H[facility])   . Type II diabetes mellitus ([place])   . Vitamin B12 deficiency   . Vitamin D deficiency    Past Surgical History:  Procedure Laterality Date  . APPENDECTOMY    . ATRIAL FIBRILLATION ABLATION  N/A 10/05/2017   Procedure: ATRIAL FIBRILLATION ABLATION;  Surgeon: Constance Haw, MD;  [facility];  Service: Cardiovascular;  Laterality: N/A;  . ATRIAL FIBRILLATION ABLATION  ~ 2014  . CARDIAC CATHETERIZATION  04/2017  . COLONOSCOPY W/ BIOPSIES AND POLYPECTOMY  2015   1 polyp removed- repeat 3 years  . CORONARY ANGIOPLASTY WITH STENT PLACEMENT  ~ 2014  . EP IMPLANTABLE DEVICE N/A 11/10/2015   Procedure: Pacemaker Implant;  Surgeon: Will [name], MD;  Location: [facility];  Service: Cardiovascular;  Laterality: N/A;  . EP IMPLANTABLE DEVICE N/A 11/11/2015   Procedure: Lead Revision/Repair;  Surgeon: Evans Lance, MD;  Location: [facility];  Service: Cardiovascular;  Laterality: N/A;  . INGUINAL HERNIA REPAIR Left 1951  . JOINT REPLACEMENT    . LAPAROSCOPIC CHOLECYSTECTOMY    . MELANOMA EXCISION  ~ 2012   "off my back" (10/05/2017)  . PARATHYROIDECTOMY     "removed 3 glands; still have 1 gland" (10/05/2017)  . RIGHT/LEFT HEART CATH AND CORONARY ANGIOGRAPHY N/A 05/03/2017   Procedure: RIGHT/LEFT HEART CATH AND CORONARY  ANGIOGRAPHY;  Surgeon: Leonie Man, MD;  Location: [facility];  Service: Cardiovascular;  Laterality: N/A;  . TONSILLECTOMY    . TOTAL SHOULDER ARTHROPLASTY Right x 2  . VAGINAL HYSTERECTOMY     "total"   Social History   Social History Narrative   Moved from [facility] - originally from [place] - daughter lives in [place] in [place] w/ husband - he is demented- fairly functional   Retired Therapist, sports, Scientist, physiological also - variety of experiences   2 sons 1 daughter   family history includes Breast cancer in her half-sister; Colon cancer in her paternal grandmother; Heart defect (age of onset: 69) in her father; Hepatitis in her brother; Rectal cancer (age of onset: 43) in her son; Thyroid disease (age of onset: 40) in her mother.   Review of Systems As per HPI.  All other review of systems appear negative at this time.

## 2018-09-18 NOTE — Patient Instructions (Signed)
I enjoyed meeting you over the phone today and discussing your health concerns regarding the positive Cologuard test.   As we discussed, you should have a colonoscopy and we can do that using the MiraLAX prep which should be better tolerated.  Before this is scheduled I want to clarify some things with Dr. Caryl Comes regarding holding the Eliquis and your orthostatic hypotension.  I need to find out if he thinks you should be on IV fluids during your preparation.  If so we would need to arrange for observation hospital admission.  Otherwise we will plan to do your procedure in the [facility].  Once I have this  sorted out I will have my staff contact you and schedule your procedure.  I appreciate the opportunity to care for you. Gatha Mayer, MD, [facility]

## 2018-09-19 ENCOUNTER — Ambulatory Visit
Admission: RE | Admit: 2018-09-19 | Discharge: 2018-09-19 | Disposition: A | Discharge status: Home / Self Care | Payer: Medicare Other | Source: Ambulatory Visit | Attending: Family | Admitting: Family

## 2018-09-19 DIAGNOSIS — Z1231 Encounter for screening mammogram for malignant neoplasm of breast: Secondary | ICD-10-CM | POA: Insufficient documentation

## 2018-09-25 ENCOUNTER — Telehealth: Payer: Self-pay | Admitting: *Deleted

## 2018-09-25 ENCOUNTER — Other Ambulatory Visit: Payer: Self-pay | Admitting: *Deleted

## 2018-09-25 ENCOUNTER — Encounter: Payer: Self-pay | Admitting: *Deleted

## 2018-09-25 NOTE — Telephone Encounter (Signed)
Per Dr Leta Baptist and Dr Jaynee Eagles, called patient and asked if she would mind switching providers to be seen by Dr Tish Frederickson tomorrow. She stated Dr Jaynee Eagles was recommended, asked if both drs were good with dementia patients. I assured her they both are. Dr Jaynee Eagles specializes in headaches, migraines but does see dementia patients. I advised her Dr Leta Baptist sees quite a few dementia patients, and he would be happy to see her. I told her we didn't want her appointment to be reschedule for a later date. She agreed; rescheduled with Dr Leta Baptist. She is aware to arrive at 2 pm for 2:30 appointment.

## 2018-09-26 ENCOUNTER — Other Ambulatory Visit: Payer: Self-pay

## 2018-09-26 ENCOUNTER — Ambulatory Visit (INDEPENDENT_AMBULATORY_CARE_PROVIDER_SITE_OTHER): Payer: Medicare Other | Admitting: Diagnostic Neuroimaging

## 2018-09-26 ENCOUNTER — Encounter: Payer: Self-pay | Admitting: Diagnostic Neuroimaging

## 2018-09-26 VITALS — BP 180/92 | HR 73 | Temp 98.4°F | Ht 63.0 in | Wt 219.6 lb

## 2018-09-26 DIAGNOSIS — R413 Other amnesia: Secondary | ICD-10-CM

## 2018-09-26 DIAGNOSIS — G3184 Mild cognitive impairment, so stated: Secondary | ICD-10-CM | POA: Diagnosis not present

## 2018-09-26 NOTE — Progress Notes (Signed)
[facility]  PATIENT: Dawn Garcia DOB: 1940-09-15  REFERRING CLINICIAN: Lamonte Sakai HISTORY FROM: patient  REASON FOR VISIT: new consult    HISTORICAL  CHIEF COMPLAINT:  Chief Complaint  Patient presents with  . Dementia    rm 7 New pt, dgtrStanton Kidney  MMSE 25 "evaluatewhether there is any progression of ischemic white matter noted in 2012 in MRI in brain. Memory changes have been noted to a degree, also developed orthostatic hypotension"    HISTORY OF PRESENT ILLNESS:   78 year old female here for evaluation of memory loss.  History of hypertension, diabetes, hypercholesterolemia, atrial fibrillation, sleep apnea.  For past 3 years patient has had mild short-term forgetfulness, losing items, forgetting recent conversations.  No major change in ADLs.  She is primary caregiver for her husband who has mild Alzheimer's disease.  Patient moved here to [place] from [place] 3 years ago.  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  Patient able to maintain all personal ADLs, hygiene, bathing, dressing, as well as function independently outside of the home.  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  She is having more word finding difficulties lately.   REVIEW OF SYSTEMS: Full 14 system review of systems performed and negative with exception of: As per HPI.  ALLERGIES: Allergies  Allergen Reactions  . Demerol [Meperidine] Anaphylaxis    Tolerated Fentanyl 11/10/15  . Multaq [Dronedarone] Diarrhea  . Sulfa Antibiotics Anaphylaxis  . Tetracyclines & Related Other (See Comments) and Swelling    Made nose, lips,  And tongue itchy Made nose, lips,  And tongue itchy  . Epinephrine     Powder- SOB, palpitations    HOME MEDICATIONS: Outpatient Medications Prior to Visit  Medication Sig Dispense Refill  . carboxymethylcellulose (REFRESH PLUS) 0.5 % SOLN Place 1 drop into both eyes 3 (three) times daily as needed.    .  Cholecalciferol (VITAMIN D3) 5000 units TABS Take 1 tablet by mouth every morning.    . clonazePAM (KLONOPIN) 0.5 MG tablet     . diltiazem (CARDIZEM CD) 180 MG 24 hr capsule TAKE 1 CAPSULE EVERY DAY 30 capsule 11  . diltiazem (CARDIZEM) 30 MG tablet Take 1 tablet every 4-6 hours AS NEEDED for AFIB 45 tablet 1  . ELIQUIS 5 MG TABS tablet TAKE ONE TABLET TWICE DAILY 180 tablet 1  . insulin NPH-regular Human (70-30) 100 UNIT/ML injection Inject 29 units in the morning before breakfast and inject 24 units in the evening before dinner 20 mL 2  . Insulin Syringe-Needle U-100 (INSULIN SYRINGE .3CC/31GX5/16") 31G X 5/16" 0.3 ML MISC Use to inject insulin twice daily 200 each 3  . levothyroxine (SYNTHROID) 137 MCG tablet TAKE ONE TABLET EVERY OTHER DAY 30 tablet 2  . levothyroxine (SYNTHROID) 150 MCG tablet Takes every other day    . metolazone (ZAROXOLYN) 2.5 MG tablet TAKE ONE TABLET TWICE A WEEK (TAKE 30 MINUTES PRIOR TO TORSEMIDE) 10 tablet 0  . midodrine (PROAMATINE) 2.5 MG tablet Take 1 tablet (2.5 mg total) every four hours during waking hours. 120 tablet 6  . Multiple Vitamins-Minerals (ICAPS AREDS 2) CAPS Take 1 capsule by mouth 2 (two) times daily.    . nitroGLYCERIN (NITROSTAT) 0.4 MG SL tablet Place 0.4 mg under the tongue every 5 (five) minutes as needed for chest pain.    . pantoprazole (PROTONIX) 40 MG tablet Take 1 tablet (40 mg total) by mouth daily. 90 tablet 3  .  potassium chloride (KLOR-CON) 8 MEQ tablet TAKE TWO TABLETS TWICE A DAY ON THE DAYSYOU TAKE TORSEMIDE 180 tablet 3  . rosuvastatin (CRESTOR) 40 MG tablet TAKE ONE TABLET BY MOUTH EVERY DAY 90 tablet 1  . Semaglutide,0.25 or 0.5MG /DOS, (OZEMPIC, 0.25 OR 0.5 MG/DOSE,) 2 MG/1.5ML SOPN Inject 0.5 mg into the skin once a week. 2 pen 5  . torsemide (DEMADEX) 20 MG tablet Take 4 tablets (80 mg) by mouth once every other day 180 tablet 3  . vitamin B-12 (CYANOCOBALAMIN) 500 MCG tablet Take 500 mcg by mouth daily.     No  facility-administered medications prior to visit.     PAST MEDICAL HISTORY: Past Medical History:  Diagnosis Date  . Age-related macular degeneration, dry, right eye   . Age-related macular degeneration, wet, left eye ([place])   . Arthritis    "all over; particularly in hands/fingers; all my joints" (10/05/2017)  . Atrial fibrillation ([place])   . CAD (coronary artery disease)   . CHF (congestive heart failure) (HCC)     A-Fib  . Chronic kidney disease   . COPD (chronic obstructive pulmonary disease) ([place])    "very mild" (10/05/2017)  . Family history of adverse reaction to anesthesia    "daughter w/PONV & woke up during endoscopy" (10/05/2017)  . GERD (gastroesophageal reflux disease)   . Gout   . Graves' disease    S/P "radioactive tx"  . Heart disease   . History of blood transfusion    "related to shoulder replacement"   . History of gout    "no longer on daily RX" (10/05/2017)  . History of hiatal hernia   . Hyperlipidemia   . Hypertension   . Hypocalcemia   . Hypokalemia   . Hypothyroidism   . Macular degeneration   . Melanoma ([place]) ~ 2012   "off my back" (10/05/2017)  . Myocardial infarction [facility])    "was told I'd had one; don't really know when" (10/05/2017)  . Orthostatic hypotension 2020  . OSA on CPAP   . Pacemaker   . 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 Renal disorder   . 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 Sinus node dysfunction (HCC)   . Type II diabetes mellitus ([place])   . Vitamin B12 deficiency   . Vitamin D deficiency     PAST SURGICAL HISTORY: Past Surgical History:  Procedure Laterality Date  . APPENDECTOMY    . ATRIAL FIBRILLATION ABLATION N/A 10/05/2017   Procedure: ATRIAL FIBRILLATION ABLATION;  Surgeon: Constance Haw, MD;  Location: [facility];  Service: Cardiovascular;  Laterality: N/A;  . ATRIAL FIBRILLATION ABLATION  ~ 2014  . CARDIAC CATHETERIZATION  04/2017  . COLONOSCOPY W/ BIOPSIES AND  POLYPECTOMY  2015   1 polyp removed- repeat 3 years  . CORONARY ANGIOPLASTY WITH STENT PLACEMENT  ~ 2014  . EP IMPLANTABLE DEVICE N/A 11/10/2015   Procedure: Pacemaker Implant;  Surgeon: Will [name], MD;  Location: [place] [facility];  Service: Cardiovascular;  Laterality: N/A;  . EP IMPLANTABLE DEVICE N/A 11/11/2015   Procedure: Lead Revision/Repair;  Surgeon: Evans Lance, MD;  Location: [facility];  Service: Cardiovascular;  Laterality: N/A;  . INGUINAL HERNIA REPAIR Left 1951  . JOINT REPLACEMENT    . LAPAROSCOPIC CHOLECYSTECTOMY    . MELANOMA EXCISION  ~ 2012   "off my back" (10/05/2017)  . PARATHYROIDECTOMY     "removed 3 glands; still have 1 gland" (  10/05/2017)  . RIGHT/LEFT HEART CATH AND CORONARY ANGIOGRAPHY N/A 05/03/2017   Procedure: RIGHT/LEFT HEART CATH AND CORONARY ANGIOGRAPHY;  Surgeon: Leonie Man, MD;  Location: [facility];  Service: Cardiovascular;  Laterality: N/A;  . TONSILLECTOMY    . TOTAL SHOULDER ARTHROPLASTY Right x 2  . VAGINAL HYSTERECTOMY     "total"    FAMILY HISTORY: Family History  Problem Relation Age of Onset  . Thyroid disease Mother 42       3 days after surgery  . Heart defect Father 34       ?ascending aortic aneurysm  . Breast cancer Sister   . Hepatitis Brother   . Colon cancer Maternal Grandmother   . Diabetes Maternal Grandmother   . Diabetes Maternal Grandfather   . Colon cancer Paternal Grandmother        47's  . Rectal cancer Son 63  . Breast cancer Half-Sister   . Diabetes Paternal Uncle   . Dementia Paternal Uncle   . Colon polyps Neg Hx   . Stomach cancer Neg Hx   . Liver cancer Neg Hx   . Pancreatic cancer Neg Hx     SOCIAL HISTORY: Social History   Socioeconomic History  . Marital status: Married    Spouse name: Barbarann Ehlers  . Number of children: 3  . Years of education: Not on file  . Highest education level: Master's degree (e.g., MA, MS, MEng, MEd, MSW, MBA)  Occupational History  .  Occupation: Retired    Comment: Animal nutritionist  . Financial resource strain: Not on file  . Food insecurity    Worry: Not on file    Inability: Not on file  . Transportation needs    Medical: Not on file    Non-medical: Not on file  Tobacco Use  . Smoking status: Former Smoker    Packs/day: 2.00    Years: 3.00    Pack years: 6.00    Types: Cigarettes    Quit date: 09/25/1968    Years since quitting: 50.0  . Smokeless tobacco: Never Used  . Tobacco comment: 10/05/2017 "smoked in my 20's"  Substance and Sexual Activity  . Alcohol use: Yes    Comment: 10/05/2017 "1 mixed drink q 2-3 months"  . Drug use: Never  . Sexual activity: Not on file  Lifestyle  . Physical activity    Days per week: Not on file    Minutes per session: Not on file  . Stress: Not on file  Relationships  . Social Herbalist on phone: Not on file    Gets together: Not on file    Attends religious service: Not on file    Active member of club or organization: Not on file    Attends meetings of clubs or organizations: Not on file    Relationship status: Not on file  . Intimate partner violence    Fear of current or ex partner: Not on file    Emotionally abused: Not on file    Physically abused: Not on file    Forced sexual activity: Not on file  Other Topics Concern  . Not on file  Social History Narrative   Moved from [facility] - originally from [place] - daughter lives in [place] in [place] w/ husband - he is demented- fairly functional   Retired Therapist, sports, Scientist, physiological also - variety of experiences   2 sons 1 daughter     [place]  GENERAL EXAM/CONSTITUTIONAL: Vitals:  Vitals:   09/26/18 1403  BP: (!) 180/92  Pulse: 73  Temp: 98.4 F (36.9 C)  Weight: 219 lb 9.6 oz (99.6 kg)  Height: 5\' 3"  (1.6 m)     Body mass index is 38.9 kg/m. Wt Readings from Last 3 Encounters:  09/26/18 219 lb 9.6 oz (99.6 kg)  09/18/18 214 lb (97.1 kg)  07/12/18 216 lb (98 kg)      Patient is in no distress; well developed, nourished and groomed; neck is supple  CARDIOVASCULAR:  Examination of carotid arteries is normal; no carotid bruits  Regular rate and rhythm, no murmurs  Examination of peripheral vascular system by observation and palpation is normal  EYES:  Ophthalmoscopic exam of optic discs and posterior segments is normal; no papilledema or hemorrhages  No exam data present  MUSCULOSKELETAL:  Gait, strength, tone, movements noted in Neurologic exam below  NEUROLOGIC: MENTAL STATUS:  MMSE - Mini Mental State Exam 09/26/2018  Orientation to time 5  Orientation to Place 2  Registration 3  Attention/ Calculation 3  Recall 3  Language- name 2 objects 2  Language- repeat 1  Language- follow 3 step command 3  Language- read & follow direction 1  Write a sentence 1  Copy design 1  Total score 25    awake, alert, oriented to person, place and time  recent and remote memory intact  normal attention and concentration  language fluent, comprehension intact, naming intact  fund of knowledge appropriate  CRANIAL NERVE:   2nd - no papilledema on fundoscopic exam  2nd, 3rd, 4th, 6th - pupils equal and reactive to light, visual fields full to confrontation, extraocular muscles intact, no nystagmus  5th - facial sensation symmetric  7th - facial strength symmetric  8th - hearing intact  9th - palate elevates symmetrically, uvula midline  11th - shoulder shrug symmetric  12th - tongue protrusion midline  MOTOR:   normal bulk and tone, full strength in the BUE, BLE  SENSORY:   normal and symmetric to light touch, temperature, vibration; EXCEPT DECR IN LEGS  COORDINATION:   finger-nose-finger, fine finger movements normal  REFLEXES:   deep tendon reflexes TRACE and symmetric  GAIT/STATION:   narrow based gait     DIAGNOSTIC DATA (LABS, IMAGING, TESTING) - I reviewed patient records, labs, notes, testing and imaging  myself where available.  Lab Results  Component Value Date   WBC 7.9 04/03/2018   HGB 12.9 04/03/2018   HCT 39.2 04/03/2018   MCV 82 04/03/2018   PLT 351 04/03/2018      Component Value Date/Time   NA 142 04/18/2018 0950   K 3.6 04/18/2018 0950   CL 102 04/18/2018 0950   CO2 23 04/18/2018 0950   GLUCOSE 166 (H) 04/18/2018 0950   GLUCOSE 249 (H) 05/19/2017 0926   BUN 16 04/18/2018 0950   CREATININE 1.20 (H) 04/18/2018 0950   CALCIUM 8.9 04/18/2018 0950   PROT 5.7 (L) 05/04/2017 0519   PROT 7.1 04/18/2017 0959   ALBUMIN 2.8 (L) 05/04/2017 0519   ALBUMIN 4.5 04/18/2017 0959   AST 48 (H) 05/04/2017 0519   ALT 50 05/04/2017 0519   ALKPHOS 361 (H) 05/04/2017 0519   BILITOT 0.9 05/04/2017 0519   BILITOT 0.5 04/18/2017 0959   GFRNONAA 44 (L) 04/18/2018 0950   GFRAA 50 (L) 04/18/2018 0950   Lab Results  Component Value Date   CHOL 127 04/19/2016   HDL 42 04/19/2016   LDLCALC 43 04/19/2016  TRIG 209 (H) 04/19/2016   CHOLHDL 3.0 04/19/2016   Lab Results  Component Value Date   HGBA1C 8.5 (A) 06/27/2018   Lab Results  Component Value Date   VITAMINB12 >2000 (H) 08/19/2015   Lab Results  Component Value Date   TSH 0.34 (L) 03/27/2018    11/23/10 MRI brain  - mild chronic small vessel ischemic disease  11/23/10 MRA head - no stenosis    ASSESSMENT AND PLAN  78 y.o. year old female here with mild memory loss for past 3 years, with no major changes in ADLs.  Dx:  1. MCI (mild cognitive impairment)   2. Memory loss     PLAN:  - MRI brain - safety / supervision issues reviewed - caregiver resources provided - caution with driving and finances   Orders Placed This Encounter  Procedures  . MR BRAIN WO CONTRAST    Return in about 6 months (around 03/29/2019).    Penni Bombard, MD 6/81/1572, 6:20 PM Certified in Neurology, Neurophysiology and Neuroimaging  [facility] [address] [place], [place] [address] [phone]

## 2018-09-27 ENCOUNTER — Other Ambulatory Visit: Payer: Self-pay | Admitting: Internal Medicine

## 2018-09-27 ENCOUNTER — Telehealth: Payer: Self-pay | Admitting: Diagnostic Neuroimaging

## 2018-09-27 NOTE — Telephone Encounter (Signed)
Medicare/humana supp auth: npr Ref # Amanda A on 09/27/18. They will reach out to the patient to schedule.

## 2018-09-28 ENCOUNTER — Telehealth: Payer: Self-pay | Admitting: Internal Medicine

## 2018-09-28 ENCOUNTER — Other Ambulatory Visit: Payer: Self-pay

## 2018-09-28 NOTE — Telephone Encounter (Signed)
Patient called to find out if Dr. Carlean Purl was able to talk to her Cardiologist about needing to find out if he thinks she should be on IV fluids during your preparation

## 2018-09-28 NOTE — Telephone Encounter (Signed)
Dr. Carlean Purl have you heard back from Dr. Caryl Comes?

## 2018-09-28 NOTE — Telephone Encounter (Signed)
RE: Colonoscopy - Eliquis and orthostasis ? - peri-procedural management Received: 1 week ago Message Contents  Deboraha Sprang, MD  Gatha Mayer, MD        I would not anticoagulate her for her friends hx  Holding the apixoban the day before and day of is proba reasonable, and if you wanted to hold a toltal of 4 doses she should do fine  She also has CHF so fluids prophylactically are not probably helpful, but she may benefit from post procedure hydratation esp if she were orthostatic by VS  Hope this helps  SK        As above  2 d Eliquis hold  Colonoscopy in [place]  Thank her for reminding me - I just had not gotten around to this

## 2018-09-28 NOTE — Telephone Encounter (Signed)
Patient has been scheduled for colonoscopy and prev-visit in August

## 2018-09-29 ENCOUNTER — Encounter: Payer: Self-pay | Admitting: Cardiology

## 2018-09-29 NOTE — Progress Notes (Signed)
Remote pacemaker transmission.   

## 2018-10-01 DIAGNOSIS — H35433 Paving stone degeneration of retina, bilateral: Secondary | ICD-10-CM | POA: Diagnosis not present

## 2018-10-01 DIAGNOSIS — H43813 Vitreous degeneration, bilateral: Secondary | ICD-10-CM | POA: Diagnosis not present

## 2018-10-01 DIAGNOSIS — H353221 Exudative age-related macular degeneration, left eye, with active choroidal neovascularization: Secondary | ICD-10-CM | POA: Diagnosis not present

## 2018-10-01 DIAGNOSIS — H353113 Nonexudative age-related macular degeneration, right eye, advanced atrophic without subfoveal involvement: Secondary | ICD-10-CM | POA: Diagnosis not present

## 2018-10-02 ENCOUNTER — Encounter: Payer: Self-pay | Admitting: Internal Medicine

## 2018-10-02 ENCOUNTER — Ambulatory Visit (INDEPENDENT_AMBULATORY_CARE_PROVIDER_SITE_OTHER): Payer: Medicare Other | Admitting: Internal Medicine

## 2018-10-02 ENCOUNTER — Other Ambulatory Visit: Payer: Self-pay

## 2018-10-02 VITALS — BP 160/100 | HR 71 | Ht 63.0 in | Wt 217.0 lb

## 2018-10-02 DIAGNOSIS — E890 Postprocedural hypothyroidism: Secondary | ICD-10-CM

## 2018-10-02 DIAGNOSIS — E1165 Type 2 diabetes mellitus with hyperglycemia: Secondary | ICD-10-CM

## 2018-10-02 DIAGNOSIS — E538 Deficiency of other specified B group vitamins: Secondary | ICD-10-CM

## 2018-10-02 DIAGNOSIS — E1159 Type 2 diabetes mellitus with other circulatory complications: Secondary | ICD-10-CM

## 2018-10-02 DIAGNOSIS — E892 Postprocedural hypoparathyroidism: Secondary | ICD-10-CM

## 2018-10-02 DIAGNOSIS — Z9009 Acquired absence of other part of head and neck: Secondary | ICD-10-CM

## 2018-10-02 LAB — VITAMIN D 25 HYDROXY (VIT D DEFICIENCY, FRACTURES): VITD: 57.76 ng/mL (ref 30.00–100.00)

## 2018-10-02 LAB — VITAMIN B12: Vitamin B-12: 633 pg/mL (ref 211–911)

## 2018-10-02 LAB — T4, FREE: Free T4: 1.35 ng/dL (ref 0.60–1.60)

## 2018-10-02 LAB — POCT GLYCOSYLATED HEMOGLOBIN (HGB A1C): Hemoglobin A1C: 6.8 % — AB (ref 4.0–5.6)

## 2018-10-02 LAB — TSH: TSH: 0.03 u[IU]/mL — ABNORMAL LOW (ref 0.35–4.50)

## 2018-10-02 MED ORDER — INSULIN NPH ISOPHANE & REGULAR (70-30) 100 UNIT/ML ~~LOC~~ SUSP: SUBCUTANEOUS | 2 refills | Status: DC

## 2018-10-02 MED ORDER — OZEMPIC (1 MG/DOSE) 2 MG/1.5ML ~~LOC~~ SOPN: 1.0000 mg | PEN_INJECTOR | SUBCUTANEOUS | 3 refills | Status: DC

## 2018-10-02 NOTE — Progress Notes (Signed)
Patient ID: Dawn Garcia [facility], female   DOB: [birth year], 78 y.o.   MRN: [id number]   HPI  Dawn Garcia is a 78 y.o.-year-old female, initially referred by her PCP, Dr. Humphrey Rolls, presenting for follow-up for uncontrolled post ablative hypothyroidism and DM2, insulin-dependent since 2013, with long-term complications (atrial fibrillation, CHF, CKD).  Last visit 3 months ago (virtual).  Since last visit, she contacted me through my chart asking about intermittent fasting.  She was recommended gastric bypass surgery by her pulmonologist but she wanted to try diet first.  Post ablative hypothyroidism: Pt. has been dx with Graves ds. At 78 y/o. She had RAI Tx in 1960 >> postablative hypothyroidism  >> started on levothyroxine.  Patient had consistently suppressed TSH levels over the years so we are not decreasing her doses very slowly due to fatigue.  She is on levothyroxine 137 mcg alternating with 150 mcg qod.  She is taking the levothyroxine: - in am - fasting - at least 30 min from b'fast - no Fe, MVI - + PPIs at night - + calcium in the p.m. - not on Biotin  Of note, she was on amiodarone in 03/2017 >> stopped 04/2017 due to AKI.  Reviewed her TFTs: Lab Results  Component Value Date   TSH 0.34 (L) 03/27/2018   TSH 0.04 (L) 02/05/2018   TSH 0.198 (L) 04/26/2017   TSH 0.174 (L) 04/18/2017   TSH 0.034 (L) 03/10/2016   TSH 0.050 (L) 12/08/2015   TSH 0.061 (L) 11/08/2015   TSH 0.079 (L) 09/09/2015   TSH 0.229 (L) 08/19/2015   FREET4 1.55 03/27/2018   FREET4 1.40 02/05/2018   FREET4 3.54 (H) 04/26/2017   FREET4 2.08 (H) 11/08/2015   FREET4 2.18 (H) 09/09/2015   FREET4 0.93 08/19/2015   T3FREE 3.4 02/05/2018   T3FREE 1.8 (L) 04/26/2017   T3FREE 3.0 11/08/2015   T3FREE 2.8 09/09/2015   T3FREE 3.1 08/19/2015  10/25/2017: TSH 0.021 (0.45-5.33), free T4 1.7 (0.66-1.14)  Graves' antibodies were not elevated Lab Results  Component Value Date   TSI 102 02/05/2018    No  results found for: THGAB No components found for: TPOAB  Pt denies: - feeling nodules in neck - hoarseness - dysphagia - choking - SOB with lying down  She has + FH of thyroid disorders in: mother (died after sx for toxic goiter) and sister (goiter).  No FH of thyroid cancer. + h/o radiation tx to head or neck: RAI tx and eye RxTx.  No herbal supplements. No Biotin use. No recent steroids use.   She has Graves' ophthalmopathy >> had increased IO pressure and did not respond to IV steroids >> she had radiation therapy and then eyelid surgery.  DM2: -Insulin-dependent since 2013  Reviewed HbA1c levels: 08/14/2018: HbA1c 8.0%  Lab Results  Component Value Date   HGBA1C 8.5 (A) 06/27/2018   HGBA1C 8.4 (A) 02/05/2018   HGBA1C 9.4 (H) 04/28/2017   HGBA1C 7.8 (H) 11/29/2016   HGBA1C 8.1 (H) 09/05/2016   HGBA1C 8.2 (H) 05/02/2016   HGBA1C 7.5 (H) 03/10/2016   HGBA1C 7.8 (H) 12/08/2015   HGBA1C 6.9 (H) 08/19/2015  10/25/2017: HbA1c 8.4%  She is on: - Novolin 70/30: -26-30 units in am before b'fast >> 27 units -24-28 units before dinner >> 23 units - Ozempic 0.5 mg weekly in a.m. - added 06/2018 >> tolerated this well but $$$ Tried Jardiance >> diarrhea.  She checks her sugars twice a day: - am: 110-160, 175, 206 >> 124-245 >>  92-151 - lunch: 201 >> n/c - 2h after lunch: 149 >> 151, 271 >> 110-155 - before dinner: 141-328 (sick) >> 157-302 >> 150 - at bedtime: 126-246 >> 225-338 >> 116-169  She has a history of CKD: Lab Results  Component Value Date   BUN 16 04/18/2018   BUN 10 04/03/2018   Lab Results  Component Value Date   CREATININE 1.20 (H) 04/18/2018   CREATININE 1.25 (H) 04/03/2018  10/25/2017: BUN/creatinine: 17/1.4, GFR 37  Latest eye exam 01/2018: Getting intraocular injections.  She had one today.  She has a history of hyperparathyroidism - h/o first sx: 1978 (2 glands excised) - h/o second sx: 2010 (1 gland excised - 3.2 g)  She takes 1-2 calcium  tablets a day.  Headaches occasionally relieved by calcium.  Her ionized calcium was slightly low: Component     Latest Ref Rng & Units 02/05/2018 03/27/2018  Calcium Ionized     4.8 - 5.6 mg/dL 4.58 (L) 4.55 (L)   Her total calcium was normal: Lab Results  Component Value Date   CALCIUM 8.9 04/18/2018   CALCIUM 8.7 04/03/2018   CALCIUM 9.1 09/13/2017   CALCIUM 9.1 05/19/2017   CALCIUM 8.9 05/16/2017   Vitamin D level was normal: Lab Results  Component Value Date   VD25OH 80.87 03/27/2018   VD25OH 59.0 08/19/2015   She was On 1000 units vitamin D daily >> stopped by PCP as her vit D was 80.  She also has a h/o B12 def-this is managed by PCP: Lab Results  Component Value Date   VITAMINB12 >2000 (H) 08/19/2015  She was not vitamin B12 supplement but this was stopped recently. She would want me to recheck her level today.  She also has a history of OSA-on CPAP, OA, history of melanoma, macular degeneration.  Also, recent dx of MCI.  ROS: Constitutional: + Weight gain, no heat or cold intolerance Eyes: no blurry vision, no xerophthalmia ENT: no sore throat, no nodules palpated in neck, no dysphagia, no odynophagia, no hoarseness Cardiovascular: no CP/+ SOB/no palpitations/no leg swelling Respiratory: no cough/+ SOB/no wheezing Gastrointestinal: no N/no V/no D/no C/no acid reflux Musculoskeletal: no muscle aches/no joint aches Skin: no rashes, no hair loss Neurological: no tremors/no numbness/no tingling/no dizziness  I reviewed pt's medications, allergies, PMH, social hx, family hx, and changes were documented in the history of present illness. Otherwise, unchanged from my initial visit note.  Past Medical History:  Diagnosis Date  . Age-related macular degeneration, dry, right eye   . Age-related macular degeneration, wet, left eye ([place])   . Arthritis    "all over; particularly in hands/fingers; all my joints" (10/05/2017)  . Atrial fibrillation ([place])   . CAD  (coronary artery disease)   . CHF (congestive heart failure) (HCC)     A-Fib  . Chronic kidney disease   . COPD (chronic obstructive pulmonary disease) ([place])    "very mild" (10/05/2017)  . Family history of adverse reaction to anesthesia    "daughter w/PONV & woke up during endoscopy" (10/05/2017)  . GERD (gastroesophageal reflux disease)   . Gout   . Graves' disease    S/P "radioactive tx"  . Heart disease   . History of blood transfusion    "related to shoulder replacement"   . History of gout    "no longer on daily RX" (10/05/2017)  . History of hiatal hernia   . Hyperlipidemia   . Hypertension   . Hypocalcemia   . Hypokalemia   .  Hypothyroidism   . Macular degeneration   . Melanoma ([place]) ~ 2012   "off my back" (10/05/2017)  . Myocardial infarction [facility])    "was told I'd had one; don't really know when" (10/05/2017)  . Orthostatic hypotension 2020  . OSA on CPAP   . Pacemaker   . 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 Renal disorder   . 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 Sinus node dysfunction (HCC)   . Type II diabetes mellitus ([place])   . Vitamin B12 deficiency   . Vitamin D deficiency    Past Surgical History:  Procedure Laterality Date  . APPENDECTOMY    . ATRIAL FIBRILLATION ABLATION N/A 10/05/2017   Procedure: ATRIAL FIBRILLATION ABLATION;  Surgeon: Constance Haw, MD;  Location: [place] [facility];  Service: Cardiovascular;  Laterality: N/A;  . ATRIAL FIBRILLATION ABLATION  ~ 2014  . CARDIAC CATHETERIZATION  04/2017  . COLONOSCOPY W/ BIOPSIES AND POLYPECTOMY  2015   1 polyp removed- repeat 3 years  . CORONARY ANGIOPLASTY WITH STENT PLACEMENT  ~ 2014  . EP IMPLANTABLE DEVICE N/A 11/10/2015   Procedure: Pacemaker Implant;  Surgeon: Will [name], MD;  Location: [place] [facility];  Service: Cardiovascular;  Laterality: N/A;  . EP IMPLANTABLE DEVICE N/A 11/11/2015   Procedure: Lead Revision/Repair;  Surgeon: Evans Lance, MD;  Location: [place] [facility];  Service: Cardiovascular;  Laterality: N/A;  . INGUINAL HERNIA REPAIR Left 1951  . JOINT REPLACEMENT    . LAPAROSCOPIC CHOLECYSTECTOMY    . MELANOMA EXCISION  ~ 2012   "off my back" (10/05/2017)  . PARATHYROIDECTOMY     "removed 3 glands; still have 1 gland" (10/05/2017)  . RIGHT/LEFT HEART CATH AND CORONARY ANGIOGRAPHY N/A 05/03/2017   Procedure: RIGHT/LEFT HEART CATH AND CORONARY ANGIOGRAPHY;  Surgeon: Leonie Man, MD;  Location: [place] [facility];  Service: Cardiovascular;  Laterality: N/A;  . TONSILLECTOMY    . TOTAL SHOULDER ARTHROPLASTY Right x 2  . VAGINAL HYSTERECTOMY     "total"   Social History   Socioeconomic History  . Marital status: Married    Spouse name: Not on file  . Number of children: 3  . Years of education: Not on file  . Highest education level: Not on file  Occupational History  . Occupation: Retired Animal nutritionist  . Financial resource strain: Not on file  . Food insecurity:    Worry: Not on file    Inability: Not on file  . Transportation needs:    Medical: Not on file    Non-medical: Not on file  Tobacco Use  . Smoking status: Former Smoker, quit in 1967    Packs/day: 2.00    Years: 3.00    Pack years: 6.00    Types: Cigarettes  . Smokeless tobacco: Never Used  . Tobacco comment: 10/05/2017 "smoked in my 20's"  Substance and Sexual Activity  . Alcohol use: Yes    Comment: 1-2 mixed drinks per month  . Drug use: Never  Lifestyle  Relationships  Social History Narrative   Lives in [place] w/ husband.  She walks her dog every day.  Current Outpatient Medications on File Prior to Visit  Medication Sig Dispense Refill  . carboxymethylcellulose (REFRESH PLUS) 0.5 % SOLN Place 1 drop into both eyes 3 (three) times daily as needed.    . Cholecalciferol (VITAMIN D3) 5000 units TABS Take 1 tablet by mouth every morning.    Marland Kitchen  clonazePAM (KLONOPIN) 0.5 MG tablet     . diltiazem (CARDIZEM CD) 180  MG 24 hr capsule TAKE 1 CAPSULE EVERY DAY 30 capsule 11  . diltiazem (CARDIZEM) 30 MG tablet Take 1 tablet every 4-6 hours AS NEEDED for AFIB 45 tablet 1  . ELIQUIS 5 MG TABS tablet TAKE ONE TABLET TWICE DAILY 180 tablet 1  . insulin NPH-regular Human (70-30) 100 UNIT/ML injection Inject 29 units in the morning before breakfast and inject 24 units in the evening before dinner 20 mL 2  . Insulin Syringe-Needle U-100 (INSULIN SYRINGE .3CC/31GX5/16") 31G X 5/16" 0.3 ML MISC Use to inject insulin twice daily 200 each 3  . levothyroxine (SYNTHROID) 137 MCG tablet TAKE ONE TABLET EVERY OTHER DAY 30 tablet 2  . levothyroxine (SYNTHROID) 150 MCG tablet TAKE ONE TABLET EVERY OTHER DAY 30 tablet 0  . metolazone (ZAROXOLYN) 2.5 MG tablet TAKE ONE TABLET TWICE A WEEK (TAKE 30 MINUTES PRIOR TO TORSEMIDE) 10 tablet 0  . midodrine (PROAMATINE) 2.5 MG tablet Take 1 tablet (2.5 mg total) every four hours during waking hours. 120 tablet 6  . Multiple Vitamins-Minerals (ICAPS AREDS 2) CAPS Take 1 capsule by mouth 2 (two) times daily.    . nitroGLYCERIN (NITROSTAT) 0.4 MG SL tablet Place 0.4 mg under the tongue every 5 (five) minutes as needed for chest pain.    . pantoprazole (PROTONIX) 40 MG tablet Take 1 tablet (40 mg total) by mouth daily. 90 tablet 3  . potassium chloride (KLOR-CON) 8 MEQ tablet TAKE TWO TABLETS TWICE A DAY ON THE DAYSYOU TAKE TORSEMIDE 180 tablet 3  . rosuvastatin (CRESTOR) 40 MG tablet TAKE ONE TABLET BY MOUTH EVERY DAY 90 tablet 1  . Semaglutide,0.25 or 0.5MG /DOS, (OZEMPIC, 0.25 OR 0.5 MG/DOSE,) 2 MG/1.5ML SOPN Inject 0.5 mg into the skin once a week. 2 pen 5  . torsemide (DEMADEX) 20 MG tablet Take 4 tablets (80 mg) by mouth once every other day 180 tablet 3  . vitamin B-12 (CYANOCOBALAMIN) 500 MCG tablet Take 500 mcg by mouth daily.     No current facility-administered medications on file prior to visit.    Allergies  Allergen Reactions  . Demerol [Meperidine] Anaphylaxis    Tolerated  Fentanyl 11/10/15  . Multaq [Dronedarone] Diarrhea  . Sulfa Antibiotics Anaphylaxis  . Tetracyclines & Related Other (See Comments) and Swelling    Made nose, lips,  And tongue itchy Made nose, lips,  And tongue itchy  . Epinephrine     Powder- SOB, palpitations   Family History  Problem Relation Age of Onset  . Thyroid disease Mother 24       3 days after surgery  . Heart defect Father 36       ?ascending aortic aneurysm  . Breast cancer Sister   . Hepatitis Brother   . Colon cancer Maternal Grandmother   . Diabetes Maternal Grandmother   . Diabetes Maternal Grandfather   . Colon cancer Paternal Grandmother        55's  . Rectal cancer Son 69  . Breast cancer Half-Sister   . Diabetes Paternal Uncle   . Dementia Paternal Uncle   . Colon polyps Neg Hx   . Stomach cancer Neg Hx   . Liver cancer Neg Hx   . Pancreatic cancer Neg Hx   Also: -Diabetes in maternal grandparents -Hypertension and uncle-hyperlipidemia in the whole family -Heart disease in father, mother, uncle-thyroid problems in mother and sister-cancer in son, grandmother, half sister  PE: BP Marland Kitchen)  160/100   Pulse 71   Ht 5\' 3"  (1.6 m)   Wt 217 lb (98.4 kg)   SpO2 97%   BMI 38.44 kg/m  Wt Readings from Last 3 Encounters:  10/02/18 217 lb (98.4 kg)  09/26/18 219 lb 9.6 oz (99.6 kg)  09/18/18 214 lb (97.1 kg)   Constitutional: overweight, in NAD Eyes: PERRLA, EOMI, no exophthalmos ENT: moist mucous membranes, no thyromegaly, no cervical lymphadenopathy Cardiovascular: RRR, No MRG Respiratory: CTA B Gastrointestinal: abdomen soft, NT, ND, BS+ Musculoskeletal: no deformities, strength intact in all 4 Skin: moist, warm, no rashes Neurological: no tremor with outstretched hands, DTR normal in all 4  ASSESSMENT: 1. Postablative Hypothyroidism  2.  Insulin-dependent DM2  3.  History of hyperparathyroidism  PLAN:  1. Patient with very longstanding, uncontrolled, post ablative hypothyroidism, with a long  history of suppressed TSH, now in the process of correcting this to the normal range.  We discussed in the past about the side effects of over replacement with thyroid hormones to include cardiac arrhythmia (she also has a history of atrial fibrillation), she has history of this), hypercoagulability, including stroke, heart attack, pulmonary embolism and in the long run osteoporosis and increased mortality.  Last TSH was much improved, now close to the normal range, at 0.34 in 03/2018, so we did not change her levothyroxine dose at that time. - she continues on LT4 137 alternating with 150 mcg every other day - pt feels good on this dose. - we discussed about taking the thyroid hormone every day, with water, >30 minutes before breakfast, separated by >4 hours from acid reflux medications, calcium, iron, multivitamins. Pt. is taking it correctly. - will check thyroid tests today: TSH and fT4 - If labs are abnormal, she will need to return for repeat TFTs in 1.5 months  2. DM2 -insulin-dependent -Patient with longstanding, uncontrolled, type 2 diabetes, on a premixed insulin regimen, with still fluctuating, abnormal sugars.  At last visit we started the GLP-1 receptor agonist.  We discussed that if this is not affordable, we will need to switch to basal-bolus insulin regimen.  However, she tells me that she is contemplating gastric bypass surgery, but she first wanted to try intermittent fasting.  She would like to try fasting for 1 day at that time.  We discussed about how to use insulin around the time of the fasting, and I basically advised her not to use her insulin doses if she does fast for an entire day.  However, if she does time restricted feeding, I advised her to establish the eating window in the first, rather than the second half of the day -This visit, sugars have significantly improved, and she feels that this is due to [place].  She would like to continue, even though this is expensive.  I  gave her a sample pen today.  She tells me that she will be in the catastrophic phase with her insurance in approximately 1 month, and at that time she is hoping that she would again afford her Ozempic -For now, we will go ahead and increase the dose of Ozempic and decrease the dose of her insulin - I suggested to:  Patient Instructions  Please decrease: - Novolin 70/30: -27 >> 20 units before b'fast -23 >> 16 units before dinner  Please increase: - Ozempic 1 mg weekly in a.m.  If you are fasting for 1 day, do not take insulin that day.  Please continue levothyroxine 137 mcg every other day.  Take the thyroid hormone every day, with water, at least 30 minutes before breakfast, separated by at least 4 hours from: - acid reflux medications - calcium - iron - multivitamins  Please return in 3 months with your sugar log.   - we checked her HbA1c: 6.8% (better!) - advised to check sugars at different times of the day - 2x a day, rotating check times - advised for yearly eye exams >> she is UTD - return to clinic in 3-4 months   3. H/o hyperparathyroidism -Now status post 3/4 parathyroidectomy -Ionized calcium levels were slightly low in the past, however, total calcium levels were normal, last check 04/2018 -She now takes 1 to 2 tablets of Tums in the afternoon.  I advised her to move the first tablet with lunch. -I will recheck her ionized calcium today -Her vitamin D level was normal in 03/2018 and we will recheck this today.  Per her request, we will also check her B12 and will send the results to PCP.  Component     Latest Ref Rng & Units 10/02/2018  TSH     0.35 - 4.50 uIU/mL 0.03 (L)  T4,Free(Direct)     0.60 - 1.60 ng/dL 1.35  Vitamin B12     211 - 911 pg/mL 633  Hemoglobin A1C     4.0 - 5.6 % 6.8 (A)  Calcium Ionized     4.8 - 5.6 mg/dL 4.89  VITD     30.00 - 100.00 ng/mL 57.76   Vitamin D and vitamin B12 levels are normal.  Ionized calcium also normal.  TSH is  suppressed.  We will need to decrease the dose of levothyroxine to only 137 mcg daily and have her back for recheck in 1.5 months.  Philemon Kingdom, MD PhD [facility] Endocrinology

## 2018-10-02 NOTE — Patient Instructions (Addendum)
Please decrease: - Novolin 70/30: -27 >> 20 units before b'fast -23 >> 16 units before dinner  Please increase: - Ozempic 1 mg weekly in a.m.  If you are fasting for 1 day, do not take insulin that day.  Please continue levothyroxine 137 mcg every other day.  Take the thyroid hormone every day, with water, at least 30 minutes before breakfast, separated by at least 4 hours from: - acid reflux medications - calcium - iron - multivitamins  Please return in 3 months with your sugar log.

## 2018-10-03 LAB — CALCIUM, IONIZED: Calcium, Ion: 4.89 mg/dL (ref 4.8–5.6)

## 2018-10-03 MED ORDER — LEVOTHYROXINE SODIUM 137 MCG PO TABS: ORAL_TABLET | ORAL | 2 refills | Status: DC

## 2018-10-08 ENCOUNTER — Telehealth: Payer: Self-pay | Admitting: Internal Medicine

## 2018-10-08 NOTE — Telephone Encounter (Signed)

## 2018-10-09 ENCOUNTER — Other Ambulatory Visit: Payer: Self-pay

## 2018-10-09 ENCOUNTER — Encounter: Payer: Self-pay | Admitting: Internal Medicine

## 2018-10-09 ENCOUNTER — Ambulatory Visit (INDEPENDENT_AMBULATORY_CARE_PROVIDER_SITE_OTHER): Payer: Medicare Other | Admitting: Internal Medicine

## 2018-10-09 VITALS — BP 150/82 | HR 78 | Ht 63.0 in | Wt 212.8 lb

## 2018-10-09 DIAGNOSIS — I480 Paroxysmal atrial fibrillation: Secondary | ICD-10-CM | POA: Diagnosis not present

## 2018-10-09 DIAGNOSIS — I442 Atrioventricular block, complete: Secondary | ICD-10-CM | POA: Diagnosis not present

## 2018-10-09 DIAGNOSIS — I4819 Other persistent atrial fibrillation: Secondary | ICD-10-CM

## 2018-10-09 DIAGNOSIS — I495 Sick sinus syndrome: Secondary | ICD-10-CM | POA: Diagnosis not present

## 2018-10-09 DIAGNOSIS — Z950 Presence of cardiac pacemaker: Secondary | ICD-10-CM | POA: Diagnosis not present

## 2018-10-09 LAB — CUP PACEART INCLINIC DEVICE CHECK
Battery Remaining Longevity: 142 mo
Battery Voltage: 3.02 V
Brady Statistic RA Percent Paced: 15 %
Brady Statistic RV Percent Paced: 13 %
Date Time Interrogation Session: 20200728113211
Implantable Lead Implant Date: 20170829
Implantable Lead Implant Date: 20170829
Implantable Lead Location: 753859
Implantable Lead Location: 753860
Implantable Pulse Generator Implant Date: 20170829
Lead Channel Impedance Value: 412.5 Ohm
Lead Channel Impedance Value: 600 Ohm
Lead Channel Pacing Threshold Amplitude: 0.75 V
Lead Channel Pacing Threshold Amplitude: 1 V
Lead Channel Pacing Threshold Pulse Width: 0.4 ms
Lead Channel Pacing Threshold Pulse Width: 0.4 ms
Lead Channel Sensing Intrinsic Amplitude: 12 mV
Lead Channel Sensing Intrinsic Amplitude: 5 mV
Lead Channel Setting Pacing Amplitude: 2 V
Lead Channel Setting Pacing Amplitude: 2.5 V
Lead Channel Setting Pacing Pulse Width: 0.4 ms
Lead Channel Setting Sensing Sensitivity: 2 mV
Pulse Gen Model: 2272
Pulse Gen Serial Number: 7945290

## 2018-10-09 MED ORDER — PYRIDOSTIGMINE BROMIDE 60 MG PO TABS: 30.0000 mg | ORAL_TABLET | Freq: Two times a day (BID) | ORAL | 6 refills | Status: DC

## 2018-10-09 MED ORDER — MIDODRINE HCL 2.5 MG PO TABS: ORAL_TABLET | ORAL | 6 refills | Status: DC

## 2018-10-09 NOTE — Progress Notes (Signed)
ELECTROPHYSIOLOGY OFFICE  NOTE  Patient ID: Dawn Garcia, MRN: [id number], DOB/AGE: [birth year] 78 y.o. Admit date: (Not on file) Date of Consult: 10/09/2018  Primary Physician: Perrin Maltese, MD Primary Cardiologist: tg       HPI Dawn Garcia is a 78 y.o. female  Seen in followup for pacing by Dr. 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 This was complicated by right atrial lead dislodgment requiring revision.  She has recurrent atrial fib; LH and orthostatic tachycardia assoc with Orthostatic hypotension  Recently started on midodrine with interval improvement,following reduction of her diuretics She was able for a while to shower without difficulty; more recently these issues have recurred.  She measured her blood pressure 1 day after walking to the bathroom and taking a shower.  150--110--80 sys, notably she showers in the mornings  She was unable to make by sleeves work; she bought Velcro ones and they kept falling down.  She did not think the abdominal binder was helpful.  She is mostly sedentary.  She is trying to lose weight on an intermittent fasting diet.  She has a history of a  PVI 2013 and repeat7/19.     DATE TEST    6/17    Echo   EF 65 %   11/17    Myoview   EF 54 % Anteroapical defect  8/18 Echo  EF 50-55%  possible wall motion abnormality-reviewed by TG no further workup necessary  2/19 Cath    moderate mid-to distal LAD with patent circumflex stent//LVEDP 8//RA 11-notably patient was in sinus rhythm  2/19` Echo   EF 50-55%       Date Cr K TSH Hgb  12/17      12/18 1.19 3.9     1/19 1.69 3.1  16.4  2/19 1.69 4.3    7/19 1.36 3.9  12.6  1/20   0.34          She has been having some shortness of breath and abdominal swelling.  It is here that she accumulates her edema.  Does not really respond vigorously to torsemide 40.  She feels overall she is doing considerably better and thinks she is having less atrial fibrillation       Thromboembolic risk factors ( age  -2, DM-1, Vasc disease -1, CHF-1, Gender-1) for a CHADSVASc Score of 6      Past Medical History:  Diagnosis Date  . Age-related macular degeneration, dry, right eye   . Age-related macular degeneration, wet, left eye ([place])   . Arthritis    "all over; particularly in hands/fingers; all my joints" (10/05/2017)  . Atrial fibrillation ([place])   . CAD (coronary artery disease)   . CHF (congestive heart failure) ([facility])     A-Fib  . Chronic kidney disease   . COPD (chronic obstructive pulmonary disease) ([place])    "very mild" (10/05/2017)  . Family history of adverse reaction to anesthesia    "daughter w/PONV & woke up during endoscopy" (10/05/2017)  . GERD (gastroesophageal reflux disease)   . Gout   . Graves' disease    S/P "radioactive tx"  . Heart disease   . History of blood transfusion    "related to shoulder replacement"   . History of gout    "no longer on daily RX" (10/05/2017)  . History of hiatal hernia   . Hyperlipidemia   . Hypertension   . Hypocalcemia   . Hypokalemia   . Hypothyroidism   .  Macular degeneration   . Melanoma ([place]) ~ 2012   "off my back" (10/05/2017)  . Myocardial infarction [facility])    "was told I'd had one; don't really know when" (10/05/2017)  . Orthostatic hypotension 2020  . OSA on CPAP   . Pacemaker   . 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 Renal disorder   . 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 Sinus node dysfunction ([facility])   . Type II diabetes mellitus ([place])   . Vitamin B12 deficiency   . Vitamin D deficiency       Surgical History:  Past Surgical History:  Procedure Laterality Date  . APPENDECTOMY    . ATRIAL FIBRILLATION ABLATION N/A 10/05/2017   Procedure: ATRIAL FIBRILLATION ABLATION;  Surgeon: Constance Haw, MD;  Location: [place] [facility];  Service: Cardiovascular;  Laterality: N/A;  . ATRIAL FIBRILLATION ABLATION  ~ 2014  . CARDIAC CATHETERIZATION   04/2017  . COLONOSCOPY W/ BIOPSIES AND POLYPECTOMY  2015   1 polyp removed- repeat 3 years  . CORONARY ANGIOPLASTY WITH STENT PLACEMENT  ~ 2014  . EP IMPLANTABLE DEVICE N/A 11/10/2015   Procedure: Pacemaker Implant;  Surgeon: Will [name], MD;  Location: [place] [facility];  Service: Cardiovascular;  Laterality: N/A;  . EP IMPLANTABLE DEVICE N/A 11/11/2015   Procedure: Lead Revision/Repair;  Surgeon: Evans Lance, MD;  Location: [place] [facility];  Service: Cardiovascular;  Laterality: N/A;  . INGUINAL HERNIA REPAIR Left 1951  . JOINT REPLACEMENT    . LAPAROSCOPIC CHOLECYSTECTOMY    . MELANOMA EXCISION  ~ 2012   "off my back" (10/05/2017)  . PARATHYROIDECTOMY     "removed 3 glands; still have 1 gland" (10/05/2017)  . RIGHT/LEFT HEART CATH AND CORONARY ANGIOGRAPHY N/A 05/03/2017   Procedure: RIGHT/LEFT HEART CATH AND CORONARY ANGIOGRAPHY;  Surgeon: Leonie Man, MD;  Location: [place] [facility];  Service: Cardiovascular;  Laterality: N/A;  . TONSILLECTOMY    . TOTAL SHOULDER ARTHROPLASTY Right x 2  . VAGINAL HYSTERECTOMY     "total"     Home Meds: Prior to Admission medications   Medication Sig Start Date End Date Taking? Authorizing Provider  albuterol (PROVENTIL HFA;VENTOLIN HFA) 108 (90 Base) MCG/ACT inhaler Inhale 2 puffs into the lungs every 6 (six) hours as needed for wheezing or shortness of breath.   Yes Historical Provider, MD  ARTIFICIAL TEAR OP Place 1 drop into both eyes as needed (for dry eyes).   Yes Historical Provider, MD  Cholecalciferol (VITAMIN D3) 5000 units TABS Take 1 tablet by mouth every morning.   Yes Historical Provider, MD  furosemide (LASIX) 20 MG tablet Take 60 mg by mouth every morning.   Yes Historical Provider, MD  isosorbide mononitrate (IMDUR) 30 MG 24 hr tablet Take 1 tablet (30 mg total) by mouth daily. 02/11/16  Yes Will [name], MD  levothyroxine (SYNTHROID, LEVOTHROID) 150 MCG tablet Take 1 tablet (150 mcg total) by mouth daily.  12/08/15  Yes Glean Hess, MD  Melatonin 5 MG TABS Take 1 tablet by mouth at bedtime.   Yes Historical Provider, MD  metFORMIN (GLUCOPHAGE) 500 MG tablet Take 1 tablet (500 mg total) by mouth 2 (two) times daily with a meal. 09/16/15  Yes Adline Potter, MD  metoprolol succinate (TOPROL XL) 25 MG 24 hr tablet Take 1 tablet (25 mg total) by mouth daily. 02/11/16  Yes Will [name], MD  nitroGLYCERIN (NITROSTAT) 0.4 MG SL  tablet Place 0.4 mg under the tongue every 5 (five) minutes as needed for chest pain.   Yes Historical Provider, MD  pantoprazole (PROTONIX) 40 MG tablet Take 1 tablet (40 mg total) by mouth daily. 10/16/15  Yes Juline Patch, MD  potassium chloride (KLOR-CON) 8 MEQ tablet Take 1 tablet (8 mEq total) by mouth 2 (two) times daily. 12/21/15  Yes Glean Hess, MD  rosuvastatin (CRESTOR) 40 MG tablet Take 1 tablet (40 mg total) by mouth daily. 09/18/15  Yes Minna Merritts, MD  sitaGLIPtin (JANUVIA) 50 MG tablet Take 50 mg by mouth daily.   Yes Historical Provider, MD  vitamin B-12 (CYANOCOBALAMIN) 500 MCG tablet Take 500 mcg by mouth daily.   Yes Historical Provider, MD  warfarin (COUMADIN) 7.5 MG tablet Take 1 tablet (7.5 mg total) by mouth daily. 01/20/16  Yes Minna Merritts, MD    Allergies:  Allergies  Allergen Reactions  . Demerol [Meperidine] Anaphylaxis    Tolerated Fentanyl 11/10/15  . Multaq [Dronedarone] Diarrhea  . Sulfa Antibiotics Anaphylaxis  . Tetracyclines & Related Other (See Comments) and Swelling    Made nose, lips,  And tongue itchy Made nose, lips,  And tongue itchy  . Epinephrine     Powder- SOB, palpitations    Social History   Socioeconomic History  . Marital status: Married    Spouse name: Barbarann Ehlers  . Number of children: 3  . Years of education: Not on file  . Highest education level: Master's degree (e.g., MA, MS, MEng, MEd, MSW, MBA)  Occupational History  . Occupation: Retired    Comment: Animal nutritionist  . Financial resource  strain: Not on file  . Food insecurity    Worry: Not on file    Inability: Not on file  . Transportation needs    Medical: Not on file    Non-medical: Not on file  Tobacco Use  . Smoking status: Former Smoker    Packs/day: 2.00    Years: 3.00    Pack years: 6.00    Types: Cigarettes    Quit date: 09/25/1968    Years since quitting: 50.0  . Smokeless tobacco: Never Used  . Tobacco comment: 10/05/2017 "smoked in my 20's"  Substance and Sexual Activity  . Alcohol use: Yes    Comment: 10/05/2017 "1 mixed drink q 2-3 months"  . Drug use: Never  . Sexual activity: Not on file  Lifestyle  . Physical activity    Days per week: Not on file    Minutes per session: Not on file  . Stress: Not on file  Relationships  . Social Herbalist on phone: Not on file    Gets together: Not on file    Attends religious service: Not on file    Active member of club or organization: Not on file    Attends meetings of clubs or organizations: Not on file    Relationship status: Not on file  . Intimate partner violence    Fear of current or ex partner: Not on file    Emotionally abused: Not on file    Physically abused: Not on file    Forced sexual activity: Not on file  Other Topics Concern  . Not on file  Social History Narrative   Moved from [facility] - originally from [place] - daughter lives in [place] in [place] w/ husband - he is demented- fairly functional   Retired Therapist, sports, Scientist, physiological also -  variety of experiences   2 sons 1 daughter     Family History  Problem Relation Age of Onset  . Thyroid disease Mother 68       3 days after surgery  . Heart defect Father 61       ?ascending aortic aneurysm  . Breast cancer Sister   . Hepatitis Brother   . Colon cancer Maternal Grandmother   . Diabetes Maternal Grandmother   . Diabetes Maternal Grandfather   . Colon cancer Paternal Grandmother        64's  . Rectal cancer Son 68  . Breast cancer Half-Sister   . Diabetes  Paternal Uncle   . Dementia Paternal Uncle   . Colon polyps Neg Hx   . Stomach cancer Neg Hx   . Liver cancer Neg Hx   . Pancreatic cancer Neg Hx      ROS:  Please see the history of present illness.     All other systems reviewed and negative.  BP (!) 150/82 (BP Location: Left Arm, Patient Position: Sitting, Cuff Size: Normal)   Pulse 78   Ht 5\' 3"  (1.6 m)   Wt 212 lb 12.8 oz (96.5 kg)   SpO2 98%   BMI 37.70 kg/m   Well developed and nourished in no acute distress HENT normal Neck supple with JVP-   Clear Regular rate and rhythm, no murmurs or gallops Abd-soft with active BS No Clubbing cyanosis edema Skin-warm and dry A & Oriented  Grossly normal sensory and motor function  ECG *sinus at 71 Interval 25/08/44*   Labs: Cardiac Enzymes No results for input(s): CKTOTAL, CKMB, TROPONINI in the last 72 hours. CBC Lab Results  Component Value Date   WBC 7.9 04/03/2018   HGB 12.9 04/03/2018   HCT 39.2 04/03/2018   MCV 82 04/03/2018   PLT 351 04/03/2018   PROTIME: No results for input(s): LABPROT, INR in the last 72 hours. Chemistry  No results for input(s): NA, K, CL, CO2, BUN, CREATININE, CALCIUM, PROT, BILITOT, ALKPHOS, ALT, AST, GLUCOSE in the last 168 hours.  Invalid input(s): LABALBU Lipids Lab Results  Component Value Date   CHOL 127 04/19/2016   HDL 42 04/19/2016   LDLCALC 43 04/19/2016   TRIG 209 (H) 04/19/2016   BNP No results found for: PROBNP Thyroid Function Tests: No results for input(s): TSH, T4TOTAL, T3FREE, THYROIDAB in the last 72 hours.  Invalid input(s): FREET3 Miscellaneous Lab Results  Component Value Date   DDIMER <0.27 04/28/2017    Radiology/Studies:  Mm 3d Screen Breast Bilateral  Result Date: 09/19/2018 CLINICAL DATA:  Screening. EXAM: DIGITAL SCREENING BILATERAL MAMMOGRAM WITH TOMO AND CAD COMPARISON:  Previous exam(s). ACR Breast Density Category b: There are scattered areas of fibroglandular density. FINDINGS: There are no  findings suspicious for malignancy. Images were processed with CAD. IMPRESSION: No mammographic evidence of malignancy. A result letter of this screening mammogram will be mailed directly to the patient. RECOMMENDATION: Screening mammogram in one year. (Code:SM-B-01Y) BI-RADS CATEGORY  1: Negative. Electronically Signed   By: Fidela Salisbury M.D.   On: 09/19/2018 17:10       Assessment and Plan:  Sick Sinus syndrome  Persistent Atrial Fib with prior PVI and redo 7/19  Pacemaker St Jude   OSA  P-wave oversensing resulting false detection of atrial fibrillation  HFpEF   chronic   Coronary artery disease with prior stenting   Orthostatic intolerance    Continues to have problems with orthostatic intolerance and shower  intolerance.  We will add Mestinon to her low-dose ProAmatine so as to try to avoid too much systolic hypertension.  I reviewed the physiology again.  Encouraged her to take her showers at night.  Continue to try and track a weight of about 216; she is trying to lose weight but I suspect this will be relatively gradual.  Stressed the importance of being vertical and not recumbent or reclined; she is extremely deconditioned emphasized the need for exercise standing and walking to do it intentionally.  Elevate the [facility]  Some chest pain; this can occur with showers and walking; suspect is more related to the tachycardia and to ischemia.  On Anticoagulation;  No bleeding issues   Some afib   We spent more than 50% of our >25 min visit in face to face counseling regarding the above     Virl Axe

## 2018-10-09 NOTE — Patient Instructions (Addendum)
Medication Instructions:  - Your physician has recommended you make the following change in your medication:   1) Midodrine 2.5 mg- take 1 tablet by mouth twice daily (once in the morning when you wake up & then 1 tablet 6 hours later)  2) Mestinon 60 mg- take 0.5 tablet (30 mg) by mouth twice daily (once in the morning when wake up & 0.5 tablet 6 hours later)  If you need a refill on your cardiac medications before your next appointment, please call your pharmacy.   Lab work: - none ordered  If you have labs (blood work) drawn today and your tests are completely normal, you will receive your results only by: Marland Kitchen MyChart Message (if you have MyChart) OR . A paper copy in the mail If you have any lab test that is abnormal or we need to change your treatment, we will call you to review the results.  Testing/Procedures: - none ordered  Follow-Up: At [facility], you and your health needs are our priority.  As part of our continuing mission to provide you with exceptional heart care, we have created designated Provider Care Teams.  These Care Teams include your primary Cardiologist (physician) and Advanced Practice Providers (APPs -  Physician Assistants and Nurse Practitioners) who all work together to provide you with the care you need, when you need it.  You will need a follow up appointment in 12 weeks (October) with Dr. Caryl Comes.   Marland Kitchen Please call our office 2 months in advance to schedule this appointment.  (call in mid August to schedule)  - Remote monitoring is used to monitor your Pacemaker of ICD from home. This monitoring reduces the number of office visits required to check your device to one time per year. It allows [place] to keep an eye on the functioning of your device to ensure it is working properly. You are scheduled for a device check from home on 12/18/2018. You may send your transmission at any time that day. If you have a wireless device, the transmission will be sent  automatically. After your physician reviews your transmission, you will receive a postcard with your next transmission date.     Any Other Special Instructions Will Be Listed Below (If Applicable). - N/A

## 2018-10-21 ENCOUNTER — Emergency Department (HOSPITAL_COMMUNITY): Payer: Medicare Other

## 2018-10-21 ENCOUNTER — Emergency Department (HOSPITAL_COMMUNITY)
Admission: EM | Admit: 2018-10-21 | Discharge: 2018-10-21 | Disposition: A | Discharge status: Home / Self Care | Payer: Medicare Other | Attending: Emergency Medicine | Admitting: Emergency Medicine

## 2018-10-21 ENCOUNTER — Telehealth: Payer: Self-pay | Admitting: Student

## 2018-10-21 ENCOUNTER — Encounter (HOSPITAL_COMMUNITY): Payer: Self-pay | Admitting: Emergency Medicine

## 2018-10-21 ENCOUNTER — Other Ambulatory Visit: Payer: Self-pay

## 2018-10-21 DIAGNOSIS — J449 Chronic obstructive pulmonary disease, unspecified: Secondary | ICD-10-CM | POA: Insufficient documentation

## 2018-10-21 DIAGNOSIS — R42 Dizziness and giddiness: Secondary | ICD-10-CM | POA: Diagnosis not present

## 2018-10-21 DIAGNOSIS — I10 Essential (primary) hypertension: Secondary | ICD-10-CM | POA: Diagnosis not present

## 2018-10-21 DIAGNOSIS — Z794 Long term (current) use of insulin: Secondary | ICD-10-CM | POA: Diagnosis not present

## 2018-10-21 DIAGNOSIS — N183 Chronic kidney disease, stage 3 (moderate): Secondary | ICD-10-CM | POA: Insufficient documentation

## 2018-10-21 DIAGNOSIS — I2510 Atherosclerotic heart disease of native coronary artery without angina pectoris: Secondary | ICD-10-CM | POA: Insufficient documentation

## 2018-10-21 DIAGNOSIS — I252 Old myocardial infarction: Secondary | ICD-10-CM | POA: Diagnosis not present

## 2018-10-21 DIAGNOSIS — I130 Hypertensive heart and chronic kidney disease with heart failure and stage 1 through stage 4 chronic kidney disease, or unspecified chronic kidney disease: Secondary | ICD-10-CM | POA: Insufficient documentation

## 2018-10-21 DIAGNOSIS — I5032 Chronic diastolic (congestive) heart failure: Secondary | ICD-10-CM | POA: Insufficient documentation

## 2018-10-21 DIAGNOSIS — E039 Hypothyroidism, unspecified: Secondary | ICD-10-CM | POA: Insufficient documentation

## 2018-10-21 DIAGNOSIS — E1122 Type 2 diabetes mellitus with diabetic chronic kidney disease: Secondary | ICD-10-CM | POA: Diagnosis not present

## 2018-10-21 DIAGNOSIS — Z79899 Other long term (current) drug therapy: Secondary | ICD-10-CM | POA: Insufficient documentation

## 2018-10-21 DIAGNOSIS — Z87891 Personal history of nicotine dependence: Secondary | ICD-10-CM | POA: Diagnosis not present

## 2018-10-21 DIAGNOSIS — Z7901 Long term (current) use of anticoagulants: Secondary | ICD-10-CM | POA: Insufficient documentation

## 2018-10-21 DIAGNOSIS — Z950 Presence of cardiac pacemaker: Secondary | ICD-10-CM | POA: Insufficient documentation

## 2018-10-21 DIAGNOSIS — R51 Headache: Secondary | ICD-10-CM | POA: Diagnosis not present

## 2018-10-21 LAB — PROTIME-INR
INR: 1.4 — ABNORMAL HIGH (ref 0.8–1.2)
Prothrombin Time: 17.2 seconds — ABNORMAL HIGH (ref 11.4–15.2)

## 2018-10-21 LAB — I-STAT CHEM 8, ED
BUN: 15 mg/dL (ref 8–23)
Calcium, Ion: 1.15 mmol/L (ref 1.15–1.40)
Chloride: 106 mmol/L (ref 98–111)
Creatinine, Ser: 0.9 mg/dL (ref 0.44–1.00)
Glucose, Bld: 153 mg/dL — ABNORMAL HIGH (ref 70–99)
HCT: 39 % (ref 36.0–46.0)
Hemoglobin: 13.3 g/dL (ref 12.0–15.0)
Potassium: 3.5 mmol/L (ref 3.5–5.1)
Sodium: 142 mmol/L (ref 135–145)
TCO2: 25 mmol/L (ref 22–32)

## 2018-10-21 LAB — CBC
HCT: 39.4 % (ref 36.0–46.0)
Hemoglobin: 12.7 g/dL (ref 12.0–15.0)
MCH: 27 pg (ref 26.0–34.0)
MCHC: 32.2 g/dL (ref 30.0–36.0)
MCV: 83.8 fL (ref 80.0–100.0)
Platelets: 183 10*3/uL (ref 150–400)
RBC: 4.7 MIL/uL (ref 3.87–5.11)
RDW: 15.2 % (ref 11.5–15.5)
WBC: 6.4 10*3/uL (ref 4.0–10.5)
nRBC: 0 % (ref 0.0–0.2)

## 2018-10-21 LAB — DIFFERENTIAL
Abs Immature Granulocytes: 0.01 10*3/uL (ref 0.00–0.07)
Basophils Absolute: 0 10*3/uL (ref 0.0–0.1)
Basophils Relative: 1 %
Eosinophils Absolute: 0.3 10*3/uL (ref 0.0–0.5)
Eosinophils Relative: 5 %
Immature Granulocytes: 0 %
Lymphocytes Relative: 31 %
Lymphs Abs: 2 10*3/uL (ref 0.7–4.0)
Monocytes Absolute: 0.5 10*3/uL (ref 0.1–1.0)
Monocytes Relative: 8 %
Neutro Abs: 3.6 10*3/uL (ref 1.7–7.7)
Neutrophils Relative %: 55 %

## 2018-10-21 LAB — COMPREHENSIVE METABOLIC PANEL
ALT: 14 U/L (ref 0–44)
AST: 23 U/L (ref 15–41)
Albumin: 3.7 g/dL (ref 3.5–5.0)
Alkaline Phosphatase: 172 U/L — ABNORMAL HIGH (ref 38–126)
Anion gap: 9 (ref 5–15)
BUN: 12 mg/dL (ref 8–23)
CO2: 24 mmol/L (ref 22–32)
Calcium: 8.9 mg/dL (ref 8.9–10.3)
Chloride: 107 mmol/L (ref 98–111)
Creatinine, Ser: 0.96 mg/dL (ref 0.44–1.00)
GFR calc Af Amer: >60 mL/min (ref >60)
GFR calc non Af Amer: 57 mL/min — ABNORMAL LOW (ref >60)
Glucose, Bld: 161 mg/dL — ABNORMAL HIGH (ref 70–99)
Potassium: 3.5 mmol/L (ref 3.5–5.1)
Sodium: 140 mmol/L (ref 135–145)
Total Bilirubin: 0.6 mg/dL (ref 0.3–1.2)
Total Protein: 6.7 g/dL (ref 6.5–8.1)

## 2018-10-21 LAB — APTT: aPTT: 40 seconds — ABNORMAL HIGH (ref 24–36)

## 2018-10-21 MED ORDER — SODIUM CHLORIDE 0.9% FLUSH: 3.0000 mL | Freq: Once | INTRAVENOUS | Status: DC

## 2018-10-21 NOTE — ED Triage Notes (Signed)
Pt c/o headache x 2 weeks, dizziness and blurred vision x 1 day, and hypertension. States she is taking midodrine for hypotension. BPs at home have been higher than her normal. A&O x 4, no weakness noted at this time.

## 2018-10-21 NOTE — Telephone Encounter (Signed)
   Patient called answering service. She has been feeling very unwell since last night with dizziness (which she states she does not normally have), extreme fatigue, intermittent chest discomfort (none now), and headache. BP earlier was 180/90-100s. Patient states she is planning on going to the Emergency Department for further evaluation because she feels so bad but wanted to know whether Dr. Caryl Comes would rather her go to the ED in [place] or [place]. Recommend that [facility] given that is where EP team is located in case they are needed. Patient voiced understanding and thanked me for calling. She is aware that she should not drive herself to the ED and has someone to take her.  Darreld Mclean, PA-C 10/21/2018 2:18 PM

## 2018-10-21 NOTE — ED Provider Notes (Signed)
[facility] EMERGENCY DEPARTMENT Provider Note   CSN: [id number] Arrival date & time: 10/21/18  1532  History   Chief Complaint Chief Complaint  Patient presents with  . Dizziness    HPI Dawn Garcia is a 78 y.o. female who presents after waking up with dizziness this a.m.  She describes the dizziness as "the room spinning".  The dizziness gets worse when she stands up and is active.  She states that it is still intermittently there when she is at rest.  The 2 days preceding her dizziness patient complains of extreme fatigue.  She states that she has been sleeping a lot recently and had to get a bed "very early" last night.  She does note some mild abdominal pain which she has at baseline.  Patient was recently started on midodrine and pyridostigmine 2 weeks ago.  She has been checking her blood pressure daily since then and notes it has been "very high" last 2 to 3 days.  States it has been in the 962 systolic at home.  Patient also states that her heart rate at rest has been in the 45-50 range which is at her pacemaker threshold.  She notes that when standing up it jumps to the 80s to 90s and when she goes to rest it lowers.  Past Medical History:  Diagnosis Date  . Age-related macular degeneration, dry, right eye   . Age-related macular degeneration, wet, left eye ([place])   . Arthritis    "all over; particularly in hands/fingers; all my joints" (10/05/2017)  . Atrial fibrillation ([place])   . CAD (coronary artery disease)   . CHF (congestive heart failure) (HCC)     A-Fib  . Chronic kidney disease   . COPD (chronic obstructive pulmonary disease) ([place])    "very mild" (10/05/2017)  . Family history of adverse reaction to anesthesia    "daughter w/PONV & woke up during endoscopy" (10/05/2017)  . GERD (gastroesophageal reflux disease)   . Gout   . Graves' disease    S/P "radioactive tx"  . Heart disease   . History of blood transfusion    "related to shoulder  replacement"   . History of gout    "no longer on daily RX" (10/05/2017)  . History of hiatal hernia   . Hyperlipidemia   . Hypertension   . Hypocalcemia   . Hypokalemia   . Hypothyroidism   . Macular degeneration   . Melanoma ([place]) ~ 2012   "off my back" (10/05/2017)  . Myocardial infarction [facility])    "was told I'd had one; don't really know when" (10/05/2017)  . Orthostatic hypotension 2020  . OSA on CPAP   . Pacemaker   . 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 Renal disorder   . 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 Sinus node dysfunction (HCC)   . Type II diabetes mellitus ([place])   . Vitamin B12 deficiency   . Vitamin D deficiency     Patient Active Problem List   Diagnosis Date Noted  . Cardiac pacemaker in situ 10/09/2018  . Chest pain 04/28/2017  . SSS (sick sinus syndrome) ([place]) 04/28/2017  . Uncontrolled type 2 diabetes mellitus with hyperglycemia, without long-term current use of insulin ([place]) 04/28/2017  . OSA (obstructive sleep apnea) 04/28/2017  . Lipoma of neck 09/05/2016  . 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 Bunion of great toe 09/05/2016  . Hyperlipidemia  09/18/2015  . Symptomatic bradycardia 09/18/2015  . Coronary artery disease 09/10/2015  . B12 deficiency 09/10/2015  . History of melanoma 09/09/2015  . COPD (chronic obstructive pulmonary disease) ([place]) 09/09/2015  . Hypothyroidism following radioiodine therapy 09/09/2015  . Obesity, Class II, BMI 35-39.9 08/20/2015  . GERD with stricture 08/20/2015  . Macular degeneration 08/20/2015  . Diastolic CHF ([place]) 67/67/2094  . History of parathyroidectomy 08/20/2015  . CKD (chronic kidney disease) stage 3, GFR 30-59 ml/min (HCC) 08/20/2015  . Paroxysmal atrial fibrillation ([place]) 08/19/2015  . Poorly controlled type 2 diabetes mellitus with circulatory disorder ([place]) 08/19/2015  . Primary gout 08/19/2015    Past Surgical History:   Procedure Laterality Date  . APPENDECTOMY    . ATRIAL FIBRILLATION ABLATION N/A 10/05/2017   Procedure: ATRIAL FIBRILLATION ABLATION;  Surgeon: Constance Haw, MD;  Location: [facility];  Service: Cardiovascular;  Laterality: N/A;  . ATRIAL FIBRILLATION ABLATION  ~ 2014  . CARDIAC CATHETERIZATION  04/2017  . COLONOSCOPY W/ BIOPSIES AND POLYPECTOMY  2015   1 polyp removed- repeat 3 years  . CORONARY ANGIOPLASTY WITH STENT PLACEMENT  ~ 2014  . EP IMPLANTABLE DEVICE N/A 11/10/2015   Procedure: Pacemaker Implant;  Surgeon: Will [name], MD;  Location: [facility];  Service: Cardiovascular;  Laterality: N/A;  . EP IMPLANTABLE DEVICE N/A 11/11/2015   Procedure: Lead Revision/Repair;  Surgeon: Evans Lance, MD;  Location: [facility];  Service: Cardiovascular;  Laterality: N/A;  . INGUINAL HERNIA REPAIR Left 1951  . JOINT REPLACEMENT    . LAPAROSCOPIC CHOLECYSTECTOMY    . MELANOMA EXCISION  ~ 2012   "off my back" (10/05/2017)  . PARATHYROIDECTOMY     "removed 3 glands; still have 1 gland" (10/05/2017)  . RIGHT/LEFT HEART CATH AND CORONARY ANGIOGRAPHY N/A 05/03/2017   Procedure: RIGHT/LEFT HEART CATH AND CORONARY ANGIOGRAPHY;  Surgeon: Leonie Man, MD;  Location: [facility];  Service: Cardiovascular;  Laterality: N/A;  . TONSILLECTOMY    . TOTAL SHOULDER ARTHROPLASTY Right x 2  . VAGINAL HYSTERECTOMY     "total"     OB History   No obstetric history on file.      Home Medications    Prior to Admission medications   Medication Sig Start Date End Date Taking? Authorizing Provider  carboxymethylcellulose (REFRESH PLUS) 0.5 % SOLN Place 1 drop into both eyes 3 (three) times daily as needed.    [provider]  Cholecalciferol (VITAMIN D3) 5000 units TABS Take 1 tablet by mouth every morning.    [provider]  clonazePAM Bobbye [place]) 0.5 MG tablet  09/17/18   [provider]  diltiazem (CARDIZEM) 30 MG tablet Take 1  tablet every 4-6 hours AS NEEDED for AFIB 09/04/18   Sherran Needs, NP  ELIQUIS 5 MG TABS tablet TAKE ONE TABLET TWICE DAILY 05/07/18   Deboraha Sprang, MD  insulin NPH-regular Human (70-30) 100 UNIT/ML injection Inject 20 units in the morning before breakfast and inject 16 units in the evening before dinner 10/02/18   Philemon Kingdom, MD  Insulin Syringe-Needle U-100 (INSULIN SYRINGE .3CC/31GX5/16") 31G X 5/16" 0.3 ML MISC Use to inject insulin twice daily 02/27/18   Philemon Kingdom, MD  levothyroxine (SYNTHROID) 137 MCG tablet TAKE ONE TABLET EVERY DAY 10/03/18   Philemon Kingdom, MD  metolazone (ZAROXOLYN) 2.5 MG tablet TAKE ONE TABLET TWICE A WEEK (TAKE 30 MINUTES PRIOR TO TORSEMIDE) 07/30/18   Deboraha Sprang, MD  midodrine (  PROAMATINE) 2.5 MG tablet Take 1 tablet (2.5 mg) by mouth twice daily 10/09/18   Deboraha Sprang, MD  Multiple Vitamins-Minerals (ICAPS AREDS 2) CAPS Take 1 capsule by mouth 2 (two) times daily.    [provider]  nitroGLYCERIN (NITROSTAT) 0.4 MG SL tablet Place 0.4 mg under the tongue every 5 (five) minutes as needed for chest pain.    [provider]  pantoprazole (PROTONIX) 40 MG tablet Take 1 tablet (40 mg total) by mouth daily. 09/05/16   Glean Hess, MD  potassium chloride (KLOR-CON) 8 MEQ tablet TAKE TWO TABLETS TWICE A DAY ON THE DAYSYOU TAKE TORSEMIDE 07/30/18   Deboraha Sprang, MD  pyridostigmine (MESTINON) 60 MG tablet Take 0.5 tablets (30 mg total) by mouth 2 (two) times a day. 10/09/18   Deboraha Sprang, MD  rosuvastatin (CRESTOR) 40 MG tablet TAKE 1 TABLET BY MOUTH DAILY 10/10/18   Minna Merritts, MD  Semaglutide, 1 MG/DOSE, (OZEMPIC, 1 MG/DOSE,) 2 MG/1.5ML SOPN Inject 1 mg into the skin once a week. 10/02/18   Philemon Kingdom, MD  torsemide (DEMADEX) 20 MG tablet Take 4 tablets (80 mg) by mouth once every other day 04/03/18   Deboraha Sprang, MD  vitamin B-12 (CYANOCOBALAMIN) 500 MCG tablet Take 500 mcg by mouth daily.    [provider]    Family History Family History  Problem Relation Age of Onset  . Thyroid disease Mother 55       3 days after surgery  . Heart defect Father 12       ?ascending aortic aneurysm  . Breast cancer Sister   . Hepatitis Brother   . Colon cancer Maternal Grandmother   . Diabetes Maternal Grandmother   . Diabetes Maternal Grandfather   . Colon cancer Paternal Grandmother        39's  . Rectal cancer Son 58  . Breast cancer Half-Sister   . Diabetes Paternal Uncle   . Dementia Paternal Uncle   . Colon polyps Neg Hx   . Stomach cancer Neg Hx   . Liver cancer Neg Hx   . Pancreatic cancer Neg Hx     Social History Social History   Tobacco Use  . Smoking status: Former Smoker    Packs/day: 2.00    Years: 3.00    Pack years: 6.00    Types: Cigarettes    Quit date: 09/25/1968    Years since quitting: 50.1  . Smokeless tobacco: Never Used  . Tobacco comment: 10/05/2017 "smoked in my 20's"  Substance Use Topics  . Alcohol use: Yes    Comment: 10/05/2017 "1 mixed drink q 2-3 months"  . Drug use: Never     Allergies   Demerol [meperidine], Multaq [dronedarone], Sulfa antibiotics, Tetracyclines & related, and Epinephrine   Review of Systems Review of Systems  Constitutional: Positive for activity change and fatigue. Negative for appetite change, chills and fever.  Respiratory: Negative for apnea, cough, chest tightness, shortness of breath and wheezing.   Cardiovascular: Negative for chest pain, palpitations and leg swelling.  Gastrointestinal: Positive for abdominal pain. Negative for constipation, diarrhea, nausea and vomiting.  Musculoskeletal: Negative for arthralgias.  Skin: Negative for color change and pallor.  Neurological: Negative for dizziness and facial asymmetry.     Physical Exam Updated Vital Signs BP (!) 169/75   Pulse (!) 55   Temp 98.6 F (37 C) (Oral)   Resp 15   Ht 5\' 3"  (1.6 m)   Wt  95.3 kg   SpO2 97%   BMI 37.20 kg/m    Physical Exam Constitutional:      General: She is not in acute distress.    Appearance: Normal appearance. She is not ill-appearing.  HENT:     Head: Normocephalic.  Eyes:     General:        Right eye: No discharge.        Left eye: No discharge.     Extraocular Movements: Extraocular movements intact.     Pupils: Pupils are equal, round, and reactive to light.  Neck:     Musculoskeletal: Normal range of motion and neck supple.  Cardiovascular:     Rate and Rhythm: Normal rate and regular rhythm.     Pulses: Normal pulses.     Heart sounds: Normal heart sounds. No murmur. No friction rub.  Pulmonary:     Effort: Pulmonary effort is normal. No respiratory distress.     Breath sounds: Normal breath sounds.  Abdominal:     General: Abdomen is flat. There is no distension.     Palpations: There is no mass.     Tenderness: There is no abdominal tenderness.  Musculoskeletal: Normal range of motion.        General: No swelling or tenderness.  Skin:    General: Skin is warm and dry.     Capillary Refill: Capillary refill takes less than 2 seconds.  Neurological:     General: No focal deficit present.     Mental Status: She is alert and oriented to person, place, and time.     Cranial Nerves: No cranial nerve deficit.  Psychiatric:        Mood and Affect: Mood normal.      ED Treatments / Results  Labs (all labs ordered are listed, but only abnormal results are displayed) Labs Reviewed  PROTIME-INR - Abnormal; Notable for the following components:      Result Value   Prothrombin Time 17.2 (*)    INR 1.4 (*)    All other components within normal limits  APTT - Abnormal; Notable for the following components:   aPTT 40 (*)    All other components within normal limits  COMPREHENSIVE METABOLIC PANEL - Abnormal; Notable for the following components:   Glucose, Bld 161 (*)    Alkaline Phosphatase 172 (*)    GFR calc non Af Amer 57 (*)    All other components within normal  limits  I-STAT CHEM 8, ED - Abnormal; Notable for the following components:   Glucose, Bld 153 (*)    All other components within normal limits  CBC  DIFFERENTIAL  CBG MONITORING, ED    EKG EKG Interpretation  Date/Time:  Sunday October 21 2018 15:55:44 EDT Ventricular Rate:  56 PR Interval:  292 QRS Duration: 88 QT Interval:  460 QTC Calculation: 443 R Axis:   77 Text Interpretation:  Sinus bradycardia with 1st degree A-V block Nonspecific T wave abnormality Abnormal ECG Confirmed by Quintella Reichert [phone]) on 10/21/2018 5:13:47 PM   Radiology Ct Head Wo Contrast  Result Date: 10/21/2018 CLINICAL DATA:  Severe headache.  Possible stroke. EXAM: CT HEAD WITHOUT CONTRAST TECHNIQUE: Contiguous axial images were obtained from the base of the skull through the vertex without intravenous contrast. COMPARISON:  MRI brain dated 02/21/2017. FINDINGS: Brain: No evidence of acute infarction, hemorrhage, hydrocephalus, extra-axial collection or mass lesion/mass effect. Chronic appearing microvascular ischemic changes are noted. There is some mild age related volume loss.  Vascular: No hyperdense vessel or unexpected calcification. Skull: Normal. Negative for fracture or focal lesion. Sinuses/Orbits: There is complete opacification of the right sphenoid sinus which appears chronic. The remaining paranasal sinuses and mastoid air cells are essentially clear. The patient is status post bilateral cataract surgery. Other: None. IMPRESSION: No acute intracranial abnormality. Electronically Signed   By: Constance Holster M.D.   On: 10/21/2018 17:07    Procedures Procedures (including critical care time)  Medications Ordered in ED Medications  sodium chloride flush (NS) 0.9 % injection 3 mL (has no administration in time range)     Initial Impression / Assessment and Plan / ED Course  I have reviewed the triage vital signs and the nursing notes.  Pertinent labs & imaging results that were available  during my care of the patient were reviewed by me and considered in my medical decision making (see chart for details).  78 year old who presents with dizziness and fatigue over the last 1 to 2 days.  CT head negative for abnormality.  CMP, CBC with differential without abnormality.  Coags elevated.  BP over 165 systolic while in room.  Likely cause of patient's visual issues and her fatigue.  Unclear if midodrine and pyridostigmine are driving his high blood pressure, or exacerbating her symptoms.  To complicate matters patient cannot get MRI secondary to pacemaker to rule out stroke.  We will get orthostatics and ambulate patient.  Orthostatics negative.  Patient able ambulate without any difficulty.  States that she is feeling much better, blood pressure 165/91 while in room.  Will interrogate pacemaker.  Discussed with physician from [facility].  Patient has been having one-to-one rhythm, and pacemaker has been firing more frequently since 7/28.  Seems to coincide with the midodrine and pyridostigmine initiation.  Cardiology will set up follow-up with patient.  Felt she was okay with discharge with close follow-up.  Final Clinical Impressions(s) / ED Diagnoses   Final diagnoses:  Dizziness  Hypertension, unspecified type    ED Discharge Orders    None    Guadalupe Dawn MD PGY-3 Family Medicine Resident   Guadalupe Dawn, MD 10/21/18 2154    Quintella Reichert, MD 10/24/18 1116

## 2018-10-21 NOTE — Discharge Instructions (Signed)
I am glad you are feeling better.  Your dizziness could be related to your medication, or it could be related to these times when your pacemaker is going off.  Your cardiologist will be in touch tomorrow to set up follow-up for you.  If you have any worsening symptoms or new symptoms, please come in and see [place].

## 2018-10-21 NOTE — ED Notes (Signed)
Patient Alert and oriented to baseline. Stable and ambulatory to baseline. Patient verbalized understanding of the discharge instructions.  Patient belongings were taken by the patient.   

## 2018-10-22 ENCOUNTER — Other Ambulatory Visit: Payer: Self-pay | Admitting: Internal Medicine

## 2018-10-26 ENCOUNTER — Other Ambulatory Visit: Payer: Self-pay | Admitting: Cardiovascular Disease

## 2018-10-26 ENCOUNTER — Telehealth: Payer: Self-pay | Admitting: Internal Medicine

## 2018-10-26 NOTE — Telephone Encounter (Signed)
See also mychart msg from patient   Patient says she was recently seen at ED and has fu questions about bp meds, erratic bp , and need for appt.   Please call asap  advise   Patient also wants to know if she needs to have a primary cards dr. To address non  EP issues .

## 2018-10-26 NOTE — Telephone Encounter (Signed)
Reviewed the patient's MyChart message/ ER notes with Dr. Caryl Comes by phone as it is unclear to me what the ER directed her to do with her meds and how to assist her at this time.  I advised Dr. Caryl Comes that the patient had restarted mestinon- he advised he would like her to continue this over the weekend and do a telehealth visit with her on Tuesday at 12:00 pm.   I called and spoke with the patient and apologized for the delay in getting back with her.  She states that her BP had been erratic and she was having increased dizziness which led her to the ER.  She has been back on mestinon x 2 days. AM BP's have been 70 systolic & 80 systolic.  Her BP got up to 401 systolic today and she felt better with this.  I have advised her of Dr. Olin Pia recommendations to stay on what she is currently taking with her meds at this time and do an e-visit with her on tueday 8/18 at 12:00 pm. She is agreeable with the above.   She is advised to check orthostatic BP readings the morning of her visit and we will call 15 minutes prior to go over her meds/ get her vitals.

## 2018-10-29 ENCOUNTER — Telehealth: Payer: Self-pay | Admitting: Internal Medicine

## 2018-10-29 ENCOUNTER — Other Ambulatory Visit: Payer: Self-pay

## 2018-10-29 MED ORDER — LEVOTHYROXINE SODIUM 137 MCG PO TABS: ORAL_TABLET | ORAL | 2 refills | Status: DC

## 2018-10-29 NOTE — Telephone Encounter (Signed)
Patient calling States that Dr Caryl Comes might be referring her to another cardiologist Patient states she would prefer Dr Clayborn Bigness

## 2018-10-30 ENCOUNTER — Encounter: Payer: Self-pay | Admitting: Internal Medicine

## 2018-10-30 ENCOUNTER — Telehealth (INDEPENDENT_AMBULATORY_CARE_PROVIDER_SITE_OTHER): Payer: Medicare Other | Admitting: Internal Medicine

## 2018-10-30 ENCOUNTER — Other Ambulatory Visit: Payer: Self-pay

## 2018-10-30 VITALS — Ht 63.0 in | Wt 203.3 lb

## 2018-10-30 DIAGNOSIS — I951 Orthostatic hypotension: Secondary | ICD-10-CM

## 2018-10-30 DIAGNOSIS — Z950 Presence of cardiac pacemaker: Secondary | ICD-10-CM | POA: Diagnosis not present

## 2018-10-30 DIAGNOSIS — I4819 Other persistent atrial fibrillation: Secondary | ICD-10-CM

## 2018-10-30 DIAGNOSIS — I495 Sick sinus syndrome: Secondary | ICD-10-CM

## 2018-10-30 DIAGNOSIS — I5032 Chronic diastolic (congestive) heart failure: Secondary | ICD-10-CM

## 2018-10-30 DIAGNOSIS — G4733 Obstructive sleep apnea (adult) (pediatric): Secondary | ICD-10-CM

## 2018-10-30 DIAGNOSIS — I2510 Atherosclerotic heart disease of native coronary artery without angina pectoris: Secondary | ICD-10-CM

## 2018-10-30 DIAGNOSIS — H40003 Preglaucoma, unspecified, bilateral: Secondary | ICD-10-CM | POA: Diagnosis not present

## 2018-10-30 NOTE — Telephone Encounter (Signed)
Patient is scheduled for a virtual visit with Dr. Caryl Comes today. I advised her last week she can discuss this with Dr. Caryl Comes today at her visit.

## 2018-10-30 NOTE — Telephone Encounter (Signed)
Routing to Dr Caryl Comes and Nira Conn for further advice.

## 2018-10-30 NOTE — Patient Instructions (Signed)
Medication Instructions:  - Your physician recommends that you continue on your current medications as directed. Please refer to the Current Medication list given to you today.   If you need a refill on your cardiac medications before your next appointment, please call your pharmacy.   Lab work: - none ordered  If you have labs (blood work) drawn today and your tests are completely normal, you will receive your results only by: Marland Kitchen MyChart Message (if you have MyChart) OR . A paper copy in the mail If you have any lab test that is abnormal or we need to change your treatment, we will call you to review the results.  Testing/Procedures: - none ordered  Follow-Up: At [facility], you and your health needs are our priority.  As part of our continuing mission to provide you with exceptional heart care, we have created designated Provider Care Teams.  These Care Teams include your primary Cardiologist (physician) and Advanced Practice Providers (APPs -  Physician Assistants and Nurse Practitioners) who all work together to provide you with the care you need, when you need it. . in 8 weeks with an E-visit with Dr. Caryl Comes.  Any Other Special Instructions Will Be Listed Below (If Applicable). -N/A

## 2018-10-30 NOTE — Progress Notes (Signed)
Electrophysiology TeleHealth Note   Due to national recommendations of social distancing due to COVID 19, an audio/video telehealth visit is felt to be most appropriate for this patient at this time.  See MyChart message from today for the patient's consent to telehealth for [facility].   Date:  10/30/2018   ID:  Dawn Garcia, DOB [birth year], MRN [id number]  Location: patient's home  Provider location: [address], [place]  Evaluation Performed: Follow-up visit  PCP:  Perrin Maltese, MD  Cardiologist:   TG Electrophysiologist:  SK   Chief Complaint:  Dizziness   History of Present Illness:    Dawn Garcia is a 78 y.o. female who presents via audio/video conferencing for a telehealth visit today.  Since last being seen in our clinic for orthostatic intolerance  the patient reports feeling better, notably she has been in the ER  See Below   She has a history of a  PVI 2013 and repeat7/19.   She has hx of HTN and have sought to manage OI with proamatine and mestinon  She was in ER 10/21/18 for orthostatic symptoms>> BP at home systolic up to 7829 In ER BP lying >>standing 179>>164   Taking pyridostigmine 30 bid -- and using thigh wraps  Walking 2 x days  203 lbs   HOB elevation   DATE TEST    6/17    Echo   EF 65 %   11/17    Myoview   EF 54 % Anteroapical defect  8/18 Echo  EF 50-55%  possible wall motion abnormality-reviewed by TG no further workup necessary  2/19 Cath    moderate mid-to distal LAD with patent circumflex stent//LVEDP 8//RA 11-notably patient was in sinus rhythm  2/19` Echo   EF 50-55%       Date Cr K TSH Hgb  12/17      12/18 1.19 3.9     1/19 1.69 3.1  16.4  2/19 1.69 4.3    7/19 1.36 3.9  12.6  1/20   0.34           Thromboembolic risk factors ( age  -2, DM-1, Vasc disease -1, CHF-1, Gender-1) for a CHADSVASc Score of 6  The patient denies symptoms of fevers, chills, cough, or new  SOB worrisome for COVID 19.    Past Medical History:  Diagnosis Date  . Age-related macular degeneration, dry, right eye   . Age-related macular degeneration, wet, left eye ([place])   . Arthritis    "all over; particularly in hands/fingers; all my joints" (10/05/2017)  . Atrial fibrillation ([place])   . CAD (coronary artery disease)   . CHF (congestive heart failure) (HCC)     A-Fib  . Chronic kidney disease   . COPD (chronic obstructive pulmonary disease) ([place])    "very mild" (10/05/2017)  . Family history of adverse reaction to anesthesia    "daughter w/PONV & woke up during endoscopy" (10/05/2017)  . GERD (gastroesophageal reflux disease)   . Gout   . Graves' disease    S/P "radioactive tx"  . Heart disease   . History of blood transfusion    "related to shoulder replacement"   . History of gout    "no longer on daily RX" (10/05/2017)  . History of hiatal hernia   . Hyperlipidemia   . Hypertension   . Hypocalcemia   . Hypokalemia   . Hypothyroidism   . Macular degeneration   . Melanoma (  [place]) ~ 2012   "off my back" (10/05/2017)  . Myocardial infarction [facility])    "was told I'd had one; don't really know when" (10/05/2017)  . Orthostatic hypotension 2020  . OSA on CPAP   . Pacemaker   . 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 Renal disorder   . 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 Sinus node dysfunction (H[facility])   . Type II diabetes mellitus ([place])   . Vitamin B12 deficiency   . Vitamin D deficiency     Past Surgical History:  Procedure Laterality Date  . APPENDECTOMY    . ATRIAL FIBRILLATION ABLATION N/A 10/05/2017   Procedure: ATRIAL FIBRILLATION ABLATION;  Surgeon: Constance Haw, MD;  Location: [place] [facility];  Service: Cardiovascular;  Laterality: N/A;  . ATRIAL FIBRILLATION ABLATION  ~ 2014  . CARDIAC CATHETERIZATION  04/2017  . COLONOSCOPY W/ BIOPSIES AND POLYPECTOMY  2015   1 polyp removed- repeat 3 years  . CORONARY  ANGIOPLASTY WITH STENT PLACEMENT  ~ 2014  . EP IMPLANTABLE DEVICE N/A 11/10/2015   Procedure: Pacemaker Implant;  Surgeon: Will [name], MD;  Location: [place] [facility];  Service: Cardiovascular;  Laterality: N/A;  . EP IMPLANTABLE DEVICE N/A 11/11/2015   Procedure: Lead Revision/Repair;  Surgeon: Evans Lance, MD;  Location: [place] [facility];  Service: Cardiovascular;  Laterality: N/A;  . INGUINAL HERNIA REPAIR Left 1951  . JOINT REPLACEMENT    . LAPAROSCOPIC CHOLECYSTECTOMY    . MELANOMA EXCISION  ~ 2012   "off my back" (10/05/2017)  . PARATHYROIDECTOMY     "removed 3 glands; still have 1 gland" (10/05/2017)  . RIGHT/LEFT HEART CATH AND CORONARY ANGIOGRAPHY N/A 05/03/2017   Procedure: RIGHT/LEFT HEART CATH AND CORONARY ANGIOGRAPHY;  Surgeon: Leonie Man, MD;  Location: [place] [facility];  Service: Cardiovascular;  Laterality: N/A;  . TONSILLECTOMY    . TOTAL SHOULDER ARTHROPLASTY Right x 2  . VAGINAL HYSTERECTOMY     "total"    Current Outpatient Medications  Medication Sig Dispense Refill  . carboxymethylcellulose (REFRESH PLUS) 0.5 % SOLN Place 1 drop into both eyes 3 (three) times daily as needed.    . Cholecalciferol (VITAMIN D3) 5000 units TABS Take 1 tablet by mouth every morning.    . diltiazem (CARDIZEM) 30 MG tablet Take 1 tablet every 4-6 hours AS NEEDED for AFIB 45 tablet 1  . ELIQUIS 5 MG TABS tablet TAKE ONE TABLET TWICE DAILY 180 tablet 1  . insulin NPH-regular Human (70-30) 100 UNIT/ML injection Inject 20 units in the morning before breakfast and inject 16 units in the evening before dinner 20 mL 2  . Insulin Syringe-Needle U-100 (INSULIN SYRINGE .3CC/31GX5/16") 31G X 5/16" 0.3 ML MISC Use to inject insulin twice daily 200 each 3  . levothyroxine (SYNTHROID) 137 MCG tablet TAKE 1 TABLET BY MOUTH EVERY DAY 90 tablet 2  . metolazone (ZAROXOLYN) 2.5 MG tablet TAKE ONE TABLET TWICE A WEEK (TAKE 30 MINUTES PRIOR TO TORSEMIDE) 10 tablet 0  . midodrine  (PROAMATINE) 2.5 MG tablet Take 1 tablet (2.5 mg) by mouth twice daily 60 tablet 6  . Multiple Vitamins-Minerals (ICAPS AREDS 2) CAPS Take 1 capsule by mouth 2 (two) times daily.    . nitroGLYCERIN (NITROSTAT) 0.4 MG SL tablet Place 0.4 mg under the tongue every 5 (five) minutes as needed for chest pain.    . pantoprazole (PROTONIX) 40 MG tablet Take 1 tablet (40 mg  total) by mouth daily. 90 tablet 3  . potassium chloride (KLOR-CON) 8 MEQ tablet TAKE TWO TABLETS TWICE A DAY ON THE DAYSYOU TAKE TORSEMIDE 180 tablet 3  . pyridostigmine (MESTINON) 60 MG tablet Take 0.5 tablets (30 mg total) by mouth 2 (two) times a day. 30 tablet 6  . rosuvastatin (CRESTOR) 40 MG tablet TAKE ONE TABLET BY MOUTH EVERY DAY 90 tablet 0  . Semaglutide, 1 MG/DOSE, (OZEMPIC, 1 MG/DOSE,) 2 MG/1.5ML SOPN Inject 1 mg into the skin once a week. 3 pen 3  . torsemide (DEMADEX) 20 MG tablet Take 4 tablets (80 mg) by mouth once every other day 180 tablet 3  . vitamin B-12 (CYANOCOBALAMIN) 500 MCG tablet Take 500 mcg by mouth daily.     No current facility-administered medications for this visit.     Allergies:   Demerol [meperidine], Multaq [dronedarone], Sulfa antibiotics, Tetracyclines & related, and Epinephrine   Social History:  The patient  reports that she quit smoking about 50 years ago. Her smoking use included cigarettes. She has a 6.00 pack-year smoking history. She has never used smokeless tobacco. She reports current alcohol use. She reports that she does not use drugs.   Family History:  The patient's   family history includes Breast cancer in her half-sister and sister; Colon cancer in her maternal grandmother and paternal grandmother; Dementia in her paternal uncle; Diabetes in her maternal grandfather, maternal grandmother, and paternal uncle; Heart defect (age of onset: 71) in her father; Hepatitis in her brother; Rectal cancer (age of onset: 39) in her son; Thyroid disease (age of onset: 3) in her mother.    ROS:  Please see the history of present illness.   All other systems are personally reviewed and negative.    Exam:    Vital Signs:  Ht 5\' 3"  (1.6 m)   Wt 203 lb 4.8 oz (92.2 kg)   BMI 36.01 kg/m     Well appearing, alert and conversant, regular work of breathing,  good skin color Eyes- anicteric, neuro- grossly intact, skin- no apparent rash or lesions or cyanosis, mouth- oral mucosa is pink   Labs/Other Tests and Data Reviewed:    Recent Labs: 10/02/2018: TSH 0.03 10/21/2018: ALT 14; BUN 15; Creatinine, Ser 0.90; Hemoglobin 13.3; Platelets 183; Potassium 3.5; Sodium 142   Wt Readings from Last 3 Encounters:  10/30/18 203 lb 4.8 oz (92.2 kg)  10/21/18 210 lb (95.3 kg)  10/09/18 212 lb 12.8 oz (96.5 kg)     Other studies personally reviewed: Additional studies/ records that were reviewed today include:ER visits   ASSESSMENT & PLAN:    Sick Sinus syndrome  Persistent Atrial Fib with prior PVI and redo 7/19  Pacemaker [employer]   OSA  P-wave oversensing resulting false detection of atrial fibrillation  HFpEF   chronic   Coronary artery disease with prior stenting   Orthostatic intolerance    Will continue pyridostigmine  She is using her thigh wraps depending on her blood pressure.  ProAmatine was associated with more significant elevation in her blood pressure.  The episode of dizziness associate with systolic hypertension prompted the question of a stroke; ER notes commented on the presence of a pacemaker precluding an MRI.  If it is felt by neurology that an MRI would be informative and therapy changing we can certainly arrange to have that done.  No ischemia.    COVID 19 screen The patient denies symptoms of COVID 19 at this time.  The importance of  social distancing was discussed today.  Follow-up:  8 weeks  telehealth visit      Current medicines are reviewed at length with the patient today.   The patient  concerns regarding her medicines.   The following changes were made today:    Labs/ tests ordered today include:  No orders of the defined types were placed in this encounter.   Future tests ( post COVID )   in    months  Patient Risk:  after full review of this patients clinical status, I feel that they are at moderate risk at this time.  Today, I have spent 13 minutes with the patient with telehealth technology discussing the above.  Signed, Virl Axe, MD  10/30/2018 12:34 PM     [place] [address] [place] [address] [phone] (office) [phone] (fax)

## 2018-10-31 ENCOUNTER — Encounter: Payer: Medicare Other | Admitting: Internal Medicine

## 2018-11-05 DIAGNOSIS — H40003 Preglaucoma, unspecified, bilateral: Secondary | ICD-10-CM | POA: Diagnosis not present

## 2018-11-15 DIAGNOSIS — H353221 Exudative age-related macular degeneration, left eye, with active choroidal neovascularization: Secondary | ICD-10-CM | POA: Diagnosis not present

## 2018-11-15 DIAGNOSIS — H43391 Other vitreous opacities, right eye: Secondary | ICD-10-CM | POA: Diagnosis not present

## 2018-11-15 DIAGNOSIS — H35372 Puckering of macula, left eye: Secondary | ICD-10-CM | POA: Diagnosis not present

## 2018-11-15 DIAGNOSIS — H353112 Nonexudative age-related macular degeneration, right eye, intermediate dry stage: Secondary | ICD-10-CM | POA: Diagnosis not present

## 2018-12-11 ENCOUNTER — Encounter: Payer: Medicare Other | Admitting: Internal Medicine

## 2018-12-18 ENCOUNTER — Ambulatory Visit (INDEPENDENT_AMBULATORY_CARE_PROVIDER_SITE_OTHER): Payer: Medicare Other | Admitting: *Deleted

## 2018-12-18 DIAGNOSIS — I480 Paroxysmal atrial fibrillation: Secondary | ICD-10-CM

## 2018-12-18 DIAGNOSIS — I495 Sick sinus syndrome: Secondary | ICD-10-CM | POA: Diagnosis not present

## 2018-12-19 DIAGNOSIS — L821 Other seborrheic keratosis: Secondary | ICD-10-CM | POA: Diagnosis not present

## 2018-12-19 DIAGNOSIS — Z1283 Encounter for screening for malignant neoplasm of skin: Secondary | ICD-10-CM | POA: Diagnosis not present

## 2018-12-19 DIAGNOSIS — Z85820 Personal history of malignant melanoma of skin: Secondary | ICD-10-CM | POA: Diagnosis not present

## 2018-12-19 LAB — CUP PACEART REMOTE DEVICE CHECK
Battery Remaining Longevity: 131 mo
Battery Remaining Percentage: 95.5 %
Battery Voltage: 3.01 V
Brady Statistic AP VP Percent: 8.4 %
Brady Statistic AP VS Percent: 4.4 %
Brady Statistic AS VP Percent: 3.9 %
Brady Statistic AS VS Percent: 82 %
Brady Statistic RA Percent Paced: 12 %
Brady Statistic RV Percent Paced: 14 %
Date Time Interrogation Session: 20201006105547
Implantable Lead Implant Date: 20170829
Implantable Lead Implant Date: 20170829
Implantable Lead Location: 753859
Implantable Lead Location: 753860
Implantable Pulse Generator Implant Date: 20170829
Lead Channel Impedance Value: 390 Ohm
Lead Channel Impedance Value: 600 Ohm
Lead Channel Pacing Threshold Amplitude: 0.75 V
Lead Channel Pacing Threshold Amplitude: 1 V
Lead Channel Pacing Threshold Pulse Width: 0.4 ms
Lead Channel Pacing Threshold Pulse Width: 0.4 ms
Lead Channel Sensing Intrinsic Amplitude: 12 mV
Lead Channel Sensing Intrinsic Amplitude: 5 mV
Lead Channel Setting Pacing Amplitude: 2 V
Lead Channel Setting Pacing Amplitude: 2.5 V
Lead Channel Setting Pacing Pulse Width: 0.4 ms
Lead Channel Setting Sensing Sensitivity: 2 mV
Pulse Gen Model: 2272
Pulse Gen Serial Number: 7945290

## 2018-12-25 ENCOUNTER — Other Ambulatory Visit: Payer: Self-pay

## 2018-12-26 DIAGNOSIS — H353112 Nonexudative age-related macular degeneration, right eye, intermediate dry stage: Secondary | ICD-10-CM | POA: Diagnosis not present

## 2018-12-26 DIAGNOSIS — H353221 Exudative age-related macular degeneration, left eye, with active choroidal neovascularization: Secondary | ICD-10-CM | POA: Diagnosis not present

## 2018-12-26 DIAGNOSIS — H35033 Hypertensive retinopathy, bilateral: Secondary | ICD-10-CM | POA: Diagnosis not present

## 2018-12-26 DIAGNOSIS — H35372 Puckering of macula, left eye: Secondary | ICD-10-CM | POA: Diagnosis not present

## 2018-12-27 ENCOUNTER — Ambulatory Visit (INDEPENDENT_AMBULATORY_CARE_PROVIDER_SITE_OTHER): Payer: Medicare Other | Admitting: Internal Medicine

## 2018-12-27 ENCOUNTER — Other Ambulatory Visit: Payer: Self-pay

## 2018-12-27 ENCOUNTER — Encounter: Payer: Self-pay | Admitting: Internal Medicine

## 2018-12-27 VITALS — BP 128/80 | HR 57 | Temp 97.7°F | Ht 62.13 in | Wt 195.4 lb

## 2018-12-27 DIAGNOSIS — E890 Postprocedural hypothyroidism: Secondary | ICD-10-CM | POA: Diagnosis not present

## 2018-12-27 DIAGNOSIS — E1159 Type 2 diabetes mellitus with other circulatory complications: Secondary | ICD-10-CM | POA: Diagnosis not present

## 2018-12-27 DIAGNOSIS — Z23 Encounter for immunization: Secondary | ICD-10-CM | POA: Diagnosis not present

## 2018-12-27 DIAGNOSIS — E1165 Type 2 diabetes mellitus with hyperglycemia: Secondary | ICD-10-CM | POA: Diagnosis not present

## 2018-12-27 LAB — POCT GLYCOSYLATED HEMOGLOBIN (HGB A1C): Hemoglobin A1C: 6.5 % — AB (ref 4.0–5.6)

## 2018-12-27 MED ORDER — TRESIBA FLEXTOUCH 200 UNIT/ML ~~LOC~~ SOPN: 18.0000 [IU] | PEN_INJECTOR | Freq: Every day | SUBCUTANEOUS | 5 refills | Status: DC

## 2018-12-27 MED ORDER — INSULIN PEN NEEDLE 32G X 4 MM MISC: 3 refills | Status: DC

## 2018-12-27 NOTE — Progress Notes (Signed)
Patient ID: Dawn Garcia [facility], female   DOB: Nov 15, 1940, 78 y.o.   MRN: [id number]   HPI  Dawn Garcia is a 78 y.o.-year-old female, initially referred by her PCP, Dr. Humphrey Rolls, presenting for follow-up for uncontrolled post ablative hypothyroidism and DM2, insulin-dependent since 2013, with long-term complications (atrial fibrillation, CHF, CKD).  Last visit 3 months ago.  Since last visit, she started intermittent fasting.  She is due a 36-hour fasting twice a week.  She feels good in the fasting periods,  Without dizziness or hypoglycemia.  We did discuss at last visit about how to adjust her insulin doses while fasting.  Overall, she did reduce her insulin since last visit.  Overall, she lost 22 pounds since last visit.  In 10/2018, BP increased to >200/105 >> went to the ED >> thought he had a stroke. Could not have an MRI b/c of pacemaker.   Post ablative hypothyroidism: Pt. has been dx with Graves ds. At 78 y/o. She had RAI Tx in 1960 >> postablative hypothyroidism  >> started on levothyroxine.  Patient had consistently suppressed TSH levels over the years so we are not decreasing her doses very slowly due to fatigue.  At last visit she was on levothyroxine 137 mcg alternating with 150 mcg qod.  Since TSH was still suppressed, we decrease the dose to 137 mcg daily.  She is taking the levothyroxine: - in am - fasting - at least 30 min from b'fast - no Fe, MVI - + PPIs at night and calcium in the p.m. - not on Biotin  Of note, she was on amiodarone in 03/2017 >> stopped 04/2017 due to AKI.  Reviewed her TFTs: Lab Results  Component Value Date   TSH 0.03 (L) 10/02/2018   TSH 0.34 (L) 03/27/2018   TSH 0.04 (L) 02/05/2018   TSH 0.198 (L) 04/26/2017   TSH 0.174 (L) 04/18/2017   TSH 0.034 (L) 03/10/2016   TSH 0.050 (L) 12/08/2015   TSH 0.061 (L) 11/08/2015   TSH 0.079 (L) 09/09/2015   TSH 0.229 (L) 08/19/2015   FREET4 1.35 10/02/2018   FREET4 1.55 03/27/2018   FREET4 1.40  02/05/2018   FREET4 3.54 (H) 04/26/2017   FREET4 2.08 (H) 11/08/2015   FREET4 2.18 (H) 09/09/2015   FREET4 0.93 08/19/2015   T3FREE 3.4 02/05/2018   T3FREE 1.8 (L) 04/26/2017   T3FREE 3.0 11/08/2015   T3FREE 2.8 09/09/2015   T3FREE 3.1 08/19/2015  10/25/2017: TSH 0.021 (0.45-5.33), free T4 1.7 (0.66-1.14)  Her Graves' antibodies were not elevated: Lab Results  Component Value Date   TSI 102 02/05/2018    No results found for: THGAB No components found for: TPOAB  Pt denies: - feeling nodules in neck - hoarseness - dysphagia - choking - SOB with lying down  She has + FH of thyroid disorders in: mother (died after sx for toxic goiter) and sister (goiter).  No family history of thyroid cancer.. + h/o radiation tx to head or neck: RAI tx and eye RxTx.  No herbal supplements, no biotin use, no recent steroid use.  She has Graves' ophthalmopathy >> had increased IO pressure and did not respond to IV steroids >> she had radiation therapy and then eyelid surgery.  DM2: -Insulin-dependent since 2013  Reviewed HbA1c levels: Lab Results  Component Value Date   HGBA1C 6.5 (A) 12/27/2018   HGBA1C 6.8 (A) 10/02/2018   HGBA1C 8.5 (A) 06/27/2018   HGBA1C 8.4 (A) 02/05/2018   HGBA1C 9.4 (H) 04/28/2017  HGBA1C 7.8 (H) 11/29/2016   HGBA1C 8.1 (H) 09/05/2016   HGBA1C 8.2 (H) 05/02/2016   HGBA1C 7.5 (H) 03/10/2016   HGBA1C 7.8 (H) 12/08/2015  08/14/2018: HbA1c 8.0%  10/25/2017: HbA1c 8.4%  She is on: - Novolin 70/30 -not taken in the day she is fasting: - 20 >> 15 units before b'fast - 16 >> 10 units before dinner - Ozempic 1 mg weekly in a.m. Tried Jardiance >> diarrhea.  She checks her sugars twice a day: - am:  124-245 >> 92-151 >> 104-157, 171, 192  - lunch: 201 >> n/c >> 131 - 2h after lunch: 149 >> 151, 271 >> 110-155 >> 132 - before dinner: 141-328 (sick) >> 157-302 >> 150 >> n/c - at bedtime: 126-246 >> 225-338 >> 116-169 >> 95-117  She has a history of CKD: Lab  Results  Component Value Date   BUN 15 10/21/2018   BUN 12 10/21/2018   Lab Results  Component Value Date   CREATININE 0.90 10/21/2018   CREATININE 0.96 10/21/2018  10/25/2017: BUN/creatinine: 17/1.4, GFR 37  Latest eye exam 01/2018: Getting intraocular injections.  She has a history of hyperparathyroidism: - h/o first sx: 1978 (2 glands excised) - h/o second sx: 2010 (1 gland excised - 3.2 g)  She takes 1-2 calcium tablets a day and headaches are occasionally relieved by this.  Her ionized calcium was slightly low in the past, but normalized at last check: Component     Latest Ref Rng & Units 10/02/2018  Calcium Ionized     4.8 - 5.6 mg/dL 4.89   Component     Latest Ref Rng & Units 02/05/2018 03/27/2018  Calcium Ionized     4.8 - 5.6 mg/dL 4.58 (L) 4.55 (L)   Her total calcium level was normal Lab Results  Component Value Date   CALCIUM 8.9 10/21/2018   CALCIUM 8.9 04/18/2018   CALCIUM 8.7 04/03/2018   CALCIUM 9.1 09/13/2017   CALCIUM 9.1 05/19/2017   At last visit, vitamin D level was normal: Lab Results  Component Value Date   VD25OH 57.76 10/02/2018   VD25OH 80.87 03/27/2018   VD25OH 59.0 08/19/2015   She was on 1000 units vitamin D daily but this was stopped by PCP as her vitamin D was 80.  She would like me to manage her vitamin D.  She also has B12 deficiency-managed by PCP: Lab Results  Component Value Date   [id number] 10/02/2018   VITAMINB12 >2000 (H) 08/19/2015   She also has a history of OSA-on CPAP, OA, history of melanoma, macular degeneration.  Also, recent dx of MCI.  ROS: Constitutional: no weight gain/+ weight loss, no fatigue, no subjective hyperthermia, no subjective hypothermia Eyes: no blurry vision, no xerophthalmia ENT: no sore throat, no nodules palpated in neck, no dysphagia, no odynophagia, no hoarseness Cardiovascular: + Resolved CP/no SOB/no palpitations/no leg swelling Respiratory: no cough/no SOB/no  wheezing Gastrointestinal: no N/no V/no D/no C/no acid reflux Musculoskeletal: no muscle aches/no joint aches Skin: no rashes, no hair loss Neurological: no tremors/no numbness/no tingling/no dizziness  I reviewed pt's medications, allergies, PMH, social hx, family hx, and changes were documented in the history of present illness. Otherwise, unchanged from my initial visit note.  Past Medical History:  Diagnosis Date  . Age-related macular degeneration, dry, right eye   . Age-related macular degeneration, wet, left eye ([place])   . Arthritis    "all over; particularly in hands/fingers; all my joints" (10/05/2017)  . Atrial fibrillation ([place])   .  CAD (coronary artery disease)   . CHF (congestive heart failure) (HCC)     A-Fib  . Chronic kidney disease   . COPD (chronic obstructive pulmonary disease) ([place])    "very mild" (10/05/2017)  . Family history of adverse reaction to anesthesia    "daughter w/PONV & woke up during endoscopy" (10/05/2017)  . GERD (gastroesophageal reflux disease)   . Gout   . Graves' disease    S/P "radioactive tx"  . Heart disease   . History of blood transfusion    "related to shoulder replacement"   . History of gout    "no longer on daily RX" (10/05/2017)  . History of hiatal hernia   . Hyperlipidemia   . Hypertension   . Hypocalcemia   . Hypokalemia   . Hypothyroidism   . Macular degeneration   . Melanoma ([place]) ~ 2012   "off my back" (10/05/2017)  . Myocardial infarction [facility])    "was told I'd had one; don't really know when" (10/05/2017)  . Orthostatic hypotension 2020  . OSA on CPAP   . Pacemaker   . 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 Renal disorder   . 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 Sinus node dysfunction (HCC)   . Type II diabetes mellitus ([place])   . Vitamin B12 deficiency   . Vitamin D deficiency    Past Surgical History:  Procedure Laterality Date  . APPENDECTOMY    . ATRIAL FIBRILLATION  ABLATION N/A 10/05/2017   Procedure: ATRIAL FIBRILLATION ABLATION;  Surgeon: Constance Haw, MD;  Location: [place] [facility];  Service: Cardiovascular;  Laterality: N/A;  . ATRIAL FIBRILLATION ABLATION  ~ 2014  . CARDIAC CATHETERIZATION  04/2017  . COLONOSCOPY W/ BIOPSIES AND POLYPECTOMY  2015   1 polyp removed- repeat 3 years  . CORONARY ANGIOPLASTY WITH STENT PLACEMENT  ~ 2014  . EP IMPLANTABLE DEVICE N/A 11/10/2015   Procedure: Pacemaker Implant;  Surgeon: Will [name], MD;  Location: [place] [facility];  Service: Cardiovascular;  Laterality: N/A;  . EP IMPLANTABLE DEVICE N/A 11/11/2015   Procedure: Lead Revision/Repair;  Surgeon: Evans Lance, MD;  Location: [place] [facility];  Service: Cardiovascular;  Laterality: N/A;  . INGUINAL HERNIA REPAIR Left 1951  . JOINT REPLACEMENT    . LAPAROSCOPIC CHOLECYSTECTOMY    . MELANOMA EXCISION  ~ 2012   "off my back" (10/05/2017)  . PARATHYROIDECTOMY     "removed 3 glands; still have 1 gland" (10/05/2017)  . RIGHT/LEFT HEART CATH AND CORONARY ANGIOGRAPHY N/A 05/03/2017   Procedure: RIGHT/LEFT HEART CATH AND CORONARY ANGIOGRAPHY;  Surgeon: Leonie Man, MD;  Location: [place] [facility];  Service: Cardiovascular;  Laterality: N/A;  . TONSILLECTOMY    . TOTAL SHOULDER ARTHROPLASTY Right x 2  . VAGINAL HYSTERECTOMY     "total"   Social History   Socioeconomic History  . Marital status: Married    Spouse name: Not on file  . Number of children: 3  . Years of education: Not on file  . Highest education level: Not on file  Occupational History  . Occupation: Retired Animal nutritionist  . Financial resource strain: Not on file  . Food insecurity:    Worry: Not on file    Inability: Not on file  . Transportation needs:    Medical: Not on file    Non-medical: Not on file  Tobacco Use  . Smoking status: Former Research scientist (life sciences),  quit in 1967    Packs/day: 2.00    Years: 3.00    Pack years: 6.00    Types: Cigarettes  . Smokeless  tobacco: Never Used  . Tobacco comment: 10/05/2017 "smoked in my 20's"  Substance and Sexual Activity  . Alcohol use: Yes    Comment: 1-2 mixed drinks per month  . Drug use: Never  Lifestyle  Relationships  Social History Narrative   Lives in [place] w/ husband.  She walks her dog every day.  Current Outpatient Medications on File Prior to Visit  Medication Sig Dispense Refill  . carboxymethylcellulose (REFRESH PLUS) 0.5 % SOLN Place 1 drop into both eyes 3 (three) times daily as needed.    . Cholecalciferol (VITAMIN D3) 5000 units TABS Take 1 tablet by mouth every morning.    . diltiazem (CARDIZEM) 30 MG tablet Take 1 tablet every 4-6 hours AS NEEDED for AFIB 45 tablet 1  . ELIQUIS 5 MG TABS tablet TAKE ONE TABLET TWICE DAILY 180 tablet 1  . insulin NPH-regular Human (70-30) 100 UNIT/ML injection Inject 20 units in the morning before breakfast and inject 16 units in the evening before dinner (Patient taking differently: Inject 15 units in the morning before breakfast and inject 10 units in the evening before dinner) 20 mL 2  . Insulin Syringe-Needle U-100 (INSULIN SYRINGE .3CC/31GX5/16") 31G X 5/16" 0.3 ML MISC Use to inject insulin twice daily 200 each 3  . levothyroxine (SYNTHROID) 137 MCG tablet TAKE 1 TABLET BY MOUTH EVERY DAY 90 tablet 2  . midodrine (PROAMATINE) 2.5 MG tablet Take 1 tablet (2.5 mg) by mouth twice daily 60 tablet 6  . Multiple Vitamins-Minerals (ICAPS AREDS 2) CAPS Take 1 capsule by mouth 2 (two) times daily.    . nitroGLYCERIN (NITROSTAT) 0.4 MG SL tablet Place 0.4 mg under the tongue every 5 (five) minutes as needed for chest pain.    . pantoprazole (PROTONIX) 40 MG tablet Take 1 tablet (40 mg total) by mouth daily. 90 tablet 3  . potassium chloride (KLOR-CON) 8 MEQ tablet TAKE TWO TABLETS TWICE A DAY ON THE DAYSYOU TAKE TORSEMIDE 180 tablet 3  . pyridostigmine (MESTINON) 60 MG tablet Take 0.5 tablets (30 mg total) by mouth 2 (two) times a day. 30 tablet 6  .  rosuvastatin (CRESTOR) 40 MG tablet TAKE ONE TABLET BY MOUTH EVERY DAY 90 tablet 0  . Semaglutide, 1 MG/DOSE, (OZEMPIC, 1 MG/DOSE,) 2 MG/1.5ML SOPN Inject 1 mg into the skin once a week. 3 pen 3  . torsemide (DEMADEX) 20 MG tablet Take 4 tablets (80 mg) by mouth once every other day 180 tablet 3  . vitamin B-12 (CYANOCOBALAMIN) 500 MCG tablet Take 500 mcg by mouth daily.    . metolazone (ZAROXOLYN) 2.5 MG tablet TAKE ONE TABLET TWICE A WEEK (TAKE 30 MINUTES PRIOR TO TORSEMIDE) 10 tablet 0   No current facility-administered medications on file prior to visit.    Allergies  Allergen Reactions  . Demerol [Meperidine] Anaphylaxis    Tolerated Fentanyl 11/10/15  . Multaq [Dronedarone] Diarrhea  . Sulfa Antibiotics Anaphylaxis  . Tetracyclines & Related Other (See Comments) and Swelling    Made nose, lips,  And tongue itchy Made nose, lips,  And tongue itchy  . Epinephrine     Powder- SOB, palpitations   Family History  Problem Relation Age of Onset  . Thyroid disease Mother 11       3 days after surgery  . Heart defect Father 16       ?  ascending aortic aneurysm  . Breast cancer Sister   . Hepatitis Brother   . Colon cancer Maternal Grandmother   . Diabetes Maternal Grandmother   . Diabetes Maternal Grandfather   . Colon cancer Paternal Grandmother        47's  . Rectal cancer Son 27  . Breast cancer Half-Sister   . Diabetes Paternal Uncle   . Dementia Paternal Uncle   . Colon polyps Neg Hx   . Stomach cancer Neg Hx   . Liver cancer Neg Hx   . Pancreatic cancer Neg Hx   Also: -Diabetes in maternal grandparents -Hypertension and uncle-hyperlipidemia in the whole family -Heart disease in father, mother, uncle-thyroid problems in mother and sister-cancer in son, grandmother, half sister  PE: BP 128/80   Pulse (!) 57   Temp 97.7 F (36.5 C) (Temporal)   Ht 5' 2.13" (1.578 m)   Wt 195 lb 6.4 oz (88.6 kg)   SpO2 98%   BMI 35.60 kg/m  Wt Readings from Last 3 Encounters:   12/27/18 195 lb 6.4 oz (88.6 kg)  10/30/18 203 lb 4.8 oz (92.2 kg)  10/21/18 210 lb (95.3 kg)   Constitutional: overweight, in NAD Eyes: PERRLA, EOMI, no exophthalmos ENT: moist mucous membranes, no thyromegaly, no cervical lymphadenopathy Cardiovascular: RRR, No MRG Respiratory: CTA B Gastrointestinal: abdomen soft, NT, ND, BS+ Musculoskeletal: no deformities, strength intact in all 4 Skin: moist, warm, no rashes Neurological: no tremor with outstretched hands, DTR normal in all 4  ASSESSMENT: 1. Postablative Hypothyroidism  2.  Insulin-dependent DM2  3.  History of hyperparathyroidism  PLAN:  1. Patient with very longstanding, uncontrolled, post ablative hypothyroidism, with a long history of suppressed TSH, now in the process of correcting this to the normal range.  We discussed in the past about the side effects of over replacement with thyroid hormones to include cardiac arrhythmia (she also has a history of atrial fibrillation), she has history of this), hypercoagulability, including stroke, heart attack, pulmonary embolism and in the long run osteoporosis and increased mortality.  The TSH in 03/2018 was close to the normal range, and 0.34 so we did not change the levothyroxine then.  However, at last visit, this was lower, at 0.03 so I advised her to decrease the dose to 137 mcg daily then - she continues on LT4 137  mcg daily - pt feels good on this dose, but still has fatigue. - we discussed about taking the thyroid hormone every day, with water, >30 minutes before breakfast, separated by >4 hours from acid reflux medications, calcium, iron, multivitamins. Pt. is taking it correctly. - will check thyroid tests today: TSH and fT4 - If labs are abnormal, she will need to return for repeat TFTs in 1.5 months  2. DM2 -insulin-dependent -Patient with longstanding, uncontrolled, type 2 diabetes, on a premixed insulin regimen, with significantly improved blood sugars at last visit  due to adding Ozempic.  Since sugars were still above target, I advised her to increase Ozempic at that time and we decreased her 70/30 insulin doses. - lost 22 lbs since last OV with intermittent fasting and her sugars improved despite reducing insulin doses.  She is not taking insulin at all during the days of the fasting, as advised at last visit. -At this visit, sugars are higher than goal in the morning and we discussed about switching from 70/30 started to basal insulin.  She agrees.  We will try to start Tresiba at the lower dose, 18 units  daily, come back to her current 25 unit daily dose.  We discussed about how to manage Tresiba dose before, during, and after the fasting days.  We will continue Ozempic for now - I suggested to:  Patient Instructions  Please continue: - Ozempic 1 mg weekly in a.m.  Stop 70/30 insulin and start: - Tresiba 18 units daily  Decrease the dose of Tresiba the day before fasting to ~10 units.  Please continue levothyroxine 137 mcg.  Take the thyroid hormone every day, with water, at least 30 minutes before breakfast, separated by at least 4 hours from: - acid reflux medications - calcium - iron - multivitamins  Please return in 3-4 months with your sugar log.   - we checked her HbA1c: 6.5% (excellent, improved) - advised to check sugars at different times of the day - 2x a day, rotating check times - advised for yearly eye exams >> she is UTD - given flu shot today - return to clinic in 3-4 months   3. H/o hyperparathyroidism -Now status post 3/4 parathyroidectomy -Review latest ionized calcium from last visit and this was normal, previous levels were slightly low -She takes 1 to 2 tablets of Tums, one with lunch and 1 later in the afternoon -Her vitamin D level was normal at last check.  We will not repeat this today.  Component     Latest Ref Rng & Units 10/02/2018 12/27/2018  TSH     0.35 - 4.50 uIU/mL 0.03 (L) 0.05 (L)  T4,Free(Direct)      0.60 - 1.60 ng/dL 1.35 1.70 (H)   TSH is still quite suppressed.  We will need to decrease the dose of levothyroxine to 125 mcg daily and repeat her TFTs in 1.5 months.Philemon Kingdom, MD PhD [facility] Endocrinology

## 2018-12-27 NOTE — Patient Instructions (Addendum)
Please continue: - Ozempic 1 mg weekly in a.m.  Stop 70/30 insulin and start: - Tresiba 18 units daily  Decrease the dose of Tresiba the day before fasting to ~10 units.  Please continue levothyroxine 137 mcg.  Take the thyroid hormone every day, with water, at least 30 minutes before breakfast, separated by at least 4 hours from: - acid reflux medications - calcium - iron - multivitamins  Please return in 3-4 months with your sugar log.

## 2018-12-27 NOTE — Progress Notes (Signed)
Remote pacemaker transmission.   

## 2018-12-28 ENCOUNTER — Other Ambulatory Visit: Payer: Self-pay | Admitting: Internal Medicine

## 2018-12-28 LAB — T4, FREE: Free T4: 1.7 ng/dL — ABNORMAL HIGH (ref 0.60–1.60)

## 2018-12-28 LAB — TSH: TSH: 0.05 u[IU]/mL — ABNORMAL LOW (ref 0.35–4.50)

## 2018-12-28 MED ORDER — LEVOTHYROXINE SODIUM 125 MCG PO TABS: 125.0000 ug | ORAL_TABLET | Freq: Every day | ORAL | 3 refills | Status: DC

## 2019-01-01 ENCOUNTER — Encounter: Payer: Self-pay | Admitting: Internal Medicine

## 2019-01-01 ENCOUNTER — Telehealth: Payer: Medicare Other | Admitting: Internal Medicine

## 2019-01-01 ENCOUNTER — Other Ambulatory Visit: Payer: Self-pay

## 2019-01-01 VITALS — BP 147/88 | HR 95 | Ht 62.0 in | Wt 188.0 lb

## 2019-01-01 NOTE — Progress Notes (Signed)
Pt not seen.

## 2019-01-30 ENCOUNTER — Telehealth: Payer: Self-pay | Admitting: Internal Medicine

## 2019-01-30 NOTE — Telephone Encounter (Signed)
Pt c/o medication issue:  1. Name of Medication: Pyrodostigmine  2. How are you currently taking this medication (dosage and times per day)? 1/2 tablet twice a day  3. Are you having a reaction (difficulty breathing--STAT)? Muscle cramps in back x day 4  4. What is your medication issue? Reaction

## 2019-01-30 NOTE — Telephone Encounter (Signed)
Attempted to call patient. LMTCB 01/30/2019   

## 2019-02-18 ENCOUNTER — Other Ambulatory Visit: Payer: Self-pay | Admitting: Internal Medicine

## 2019-02-18 NOTE — Telephone Encounter (Signed)
Age 78, weight 85kg, SCr 0.96 on 10/21/18, last OV 12/2018, afib indication

## 2019-02-20 DIAGNOSIS — H35372 Puckering of macula, left eye: Secondary | ICD-10-CM | POA: Diagnosis not present

## 2019-02-20 DIAGNOSIS — H353112 Nonexudative age-related macular degeneration, right eye, intermediate dry stage: Secondary | ICD-10-CM | POA: Diagnosis not present

## 2019-02-20 DIAGNOSIS — H353221 Exudative age-related macular degeneration, left eye, with active choroidal neovascularization: Secondary | ICD-10-CM | POA: Diagnosis not present

## 2019-02-20 DIAGNOSIS — H43813 Vitreous degeneration, bilateral: Secondary | ICD-10-CM | POA: Diagnosis not present

## 2019-03-19 ENCOUNTER — Ambulatory Visit (INDEPENDENT_AMBULATORY_CARE_PROVIDER_SITE_OTHER): Payer: Medicare Other | Admitting: *Deleted

## 2019-03-19 DIAGNOSIS — I495 Sick sinus syndrome: Secondary | ICD-10-CM

## 2019-03-19 LAB — CUP PACEART REMOTE DEVICE CHECK
Battery Remaining Longevity: 130 mo
Battery Remaining Percentage: 95.5 %
Battery Voltage: 3.01 V
Brady Statistic AP VP Percent: 13 %
Brady Statistic AP VS Percent: 8.9 %
Brady Statistic AS VP Percent: 3.9 %
Brady Statistic AS VS Percent: 73 %
Brady Statistic RA Percent Paced: 21 %
Brady Statistic RV Percent Paced: 19 %
Date Time Interrogation Session: 20210105023819
Implantable Lead Implant Date: 20170829
Implantable Lead Implant Date: 20170829
Implantable Lead Location: 753859
Implantable Lead Location: 753860
Implantable Pulse Generator Implant Date: 20170829
Lead Channel Impedance Value: 390 Ohm
Lead Channel Impedance Value: 600 Ohm
Lead Channel Pacing Threshold Amplitude: 0.75 V
Lead Channel Pacing Threshold Amplitude: 1 V
Lead Channel Pacing Threshold Pulse Width: 0.4 ms
Lead Channel Pacing Threshold Pulse Width: 0.4 ms
Lead Channel Sensing Intrinsic Amplitude: 12 mV
Lead Channel Sensing Intrinsic Amplitude: 5 mV
Lead Channel Setting Pacing Amplitude: 2 V
Lead Channel Setting Pacing Amplitude: 2.5 V
Lead Channel Setting Pacing Pulse Width: 0.4 ms
Lead Channel Setting Sensing Sensitivity: 2 mV
Pulse Gen Model: 2272
Pulse Gen Serial Number: 7945290

## 2019-03-20 ENCOUNTER — Other Ambulatory Visit: Payer: Self-pay

## 2019-03-20 ENCOUNTER — Encounter: Payer: Self-pay | Admitting: Diagnostic Neuroimaging

## 2019-03-20 ENCOUNTER — Ambulatory Visit (INDEPENDENT_AMBULATORY_CARE_PROVIDER_SITE_OTHER): Payer: Medicare Other | Admitting: Diagnostic Neuroimaging

## 2019-03-20 ENCOUNTER — Other Ambulatory Visit (INDEPENDENT_AMBULATORY_CARE_PROVIDER_SITE_OTHER): Payer: Medicare Other

## 2019-03-20 VITALS — BP 169/83 | HR 55 | Temp 97.5°F | Ht 63.0 in | Wt 185.0 lb

## 2019-03-20 DIAGNOSIS — E890 Postprocedural hypothyroidism: Secondary | ICD-10-CM | POA: Diagnosis not present

## 2019-03-20 DIAGNOSIS — G3184 Mild cognitive impairment, so stated: Secondary | ICD-10-CM

## 2019-03-20 DIAGNOSIS — R413 Other amnesia: Secondary | ICD-10-CM | POA: Diagnosis not present

## 2019-03-20 NOTE — Progress Notes (Signed)
[facility]  PATIENT: Dawn Garcia DOB: Oct 12, 1940  REFERRING CLINICIAN: Lamonte Sakai HISTORY FROM: patient  REASON FOR VISIT: follow up   HISTORICAL  CHIEF COMPLAINT:  Chief Complaint  Patient presents with  . MCI    rm 7  MMSE 29 "I forget details, forget what I was going to do the next day, forget family members names for 15-20 seconds"    HISTORY OF PRESENT ILLNESS:   UPDATE (03/20/19, VRP): Since last visit, doing sl worse with memory loss. More stress during covid. Taking care of husband at home (who has mod dementia). Symptoms are progressive. No alleviating or aggravating factors. Also with orthostatic hypotension (on pyridostigmine).     PRIOR HPI (09/26/18): 79 year old female here for evaluation of memory loss.  History of hypertension, diabetes, hypercholesterolemia, atrial fibrillation, sleep apnea.  For past 3 years patient has had mild short-term forgetfulness, losing items, forgetting recent conversations.  No major change in ADLs.  She is primary caregiver for her husband who has mild Alzheimer's disease.  Patient moved here to [place] from [place] 3 years ago.  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  Patient able to maintain all personal ADLs, hygiene, bathing, dressing, as well as function independently outside of the home.  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  She is having more word finding difficulties lately.   REVIEW OF SYSTEMS: Full 14 system review of systems performed and negative with exception of: As per HPI.  ALLERGIES: Allergies  Allergen Reactions  . Demerol [Meperidine] Anaphylaxis    Tolerated Fentanyl 11/10/15  . Multaq [Dronedarone] Diarrhea  . Sulfa Antibiotics Anaphylaxis  . Tetracyclines & Related Other (See Comments) and Swelling    Made nose, lips,  And tongue itchy Made nose, lips,  And tongue itchy  . Epinephrine     Powder- SOB, palpitations    HOME MEDICATIONS:  Outpatient Medications Prior to Visit  Medication Sig Dispense Refill  . carboxymethylcellulose (REFRESH PLUS) 0.5 % SOLN Place 1 drop into both eyes 3 (three) times daily as needed.    . Cholecalciferol (VITAMIN D3) 5000 units TABS Take 1 tablet by mouth every morning.    . diltiazem (CARDIZEM) 30 MG tablet Take 1 tablet every 4-6 hours AS NEEDED for AFIB 45 tablet 1  . ELIQUIS 5 MG TABS tablet TAKE ONE TABLET TWICE DAILY 180 tablet 1  . Insulin Degludec (TRESIBA FLEXTOUCH) 200 UNIT/ML SOPN Inject 18 Units into the skin daily. (Patient taking differently: Inject 10-18 Units into the skin daily. ) 3 pen 5  . Insulin Pen Needle 32G X 4 MM MISC Use 1x a day 100 each 3  . Insulin Syringe-Needle U-100 (INSULIN SYRINGE .3CC/31GX5/16") 31G X 5/16" 0.3 ML MISC Use to inject insulin twice daily 200 each 3  . levothyroxine (SYNTHROID) 125 MCG tablet Take 1 tablet (125 mcg total) by mouth daily. 45 tablet 3  . metolazone (ZAROXOLYN) 2.5 MG tablet TAKE ONE TABLET TWICE A WEEK (TAKE 30 MINUTES PRIOR TO TORSEMIDE) 10 tablet 0  . midodrine (PROAMATINE) 2.5 MG tablet Take 1 tablet (2.5 mg) by mouth twice daily 60 tablet 6  . Multiple Vitamins-Minerals (ICAPS AREDS 2) CAPS Take 1 capsule by mouth 2 (two) times daily.    . nitroGLYCERIN (NITROSTAT) 0.4 MG SL tablet Place 0.4 mg under the tongue every 5 (five) minutes as needed for chest pain.    Marland Kitchen OZEMPIC, 1 MG/DOSE, 2 MG/1.5ML SOPN INJECT 1MG   INTO THE SKIN ONCE A WEEK 3 mL 2  . pantoprazole (PROTONIX) 40 MG tablet Take 1 tablet (40 mg total) by mouth daily. 90 tablet 3  . potassium chloride (KLOR-CON) 8 MEQ tablet TAKE TWO TABLETS TWICE A DAY ON THE DAYSYOU TAKE TORSEMIDE 180 tablet 3  . pyridostigmine (MESTINON) 60 MG tablet Take 0.5 tablets (30 mg total) by mouth 2 (two) times a day. 30 tablet 6  . rosuvastatin (CRESTOR) 40 MG tablet TAKE ONE TABLET BY MOUTH EVERY DAY 90 tablet 0  . vitamin B-12 (CYANOCOBALAMIN) 500 MCG tablet Take 500 mcg by mouth daily.     Marland Kitchen torsemide (DEMADEX) 20 MG tablet Take 4 tablets (80 mg) by mouth once every other day (Patient not taking: Reported on 03/20/2019) 180 tablet 3  . insulin NPH-regular Human (70-30) 100 UNIT/ML injection Inject 20 units in the morning before breakfast and inject 16 units in the evening before dinner (Patient taking differently: Inject 15 units in the morning before breakfast and inject 10 units in the evening before dinner) 20 mL 2   No facility-administered medications prior to visit.    PAST MEDICAL HISTORY: Past Medical History:  Diagnosis Date  . Age-related macular degeneration, dry, right eye   . Age-related macular degeneration, wet, left eye ([place])   . Arthritis    "all over; particularly in hands/fingers; all my joints" (10/05/2017)  . Atrial fibrillation ([place])   . CAD (coronary artery disease)   . CHF (congestive heart failure) ([facility])     A-Fib  . Chronic kidney disease   . COPD (chronic obstructive pulmonary disease) ([place])    "very mild" (10/05/2017)  . Family history of adverse reaction to anesthesia    "daughter w/PONV & woke up during endoscopy" (10/05/2017)  . GERD (gastroesophageal reflux disease)   . Gout   . Graves' disease    S/P "radioactive tx"  . Heart disease   . History of blood transfusion    "related to shoulder replacement"   . History of gout    "no longer on daily RX" (10/05/2017)  . History of hiatal hernia   . Hyperlipidemia   . Hypertension   . Hypocalcemia   . Hypokalemia   . Hypothyroidism   . Macular degeneration   . Melanoma ([place]) ~ 2012   "off my back" (10/05/2017)  . Myocardial infarction [facility])    "was told I'd had one; don't really know when" (10/05/2017)  . Orthostatic hypotension 2020  . OSA on CPAP   . Pacemaker   . 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 Renal disorder   . 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 Sinus node dysfunction ([facility])   . Type II diabetes mellitus ([place])   . Vitamin B12  deficiency   . Vitamin D deficiency     PAST SURGICAL HISTORY: Past Surgical History:  Procedure Laterality Date  . APPENDECTOMY    . ATRIAL FIBRILLATION ABLATION N/A 10/05/2017   Procedure: ATRIAL FIBRILLATION ABLATION;  Surgeon: Constance Haw, MD;  Location: [facility];  Service: Cardiovascular;  Laterality: N/A;  . ATRIAL FIBRILLATION ABLATION  ~ 2014  . CARDIAC CATHETERIZATION  04/2017  . COLONOSCOPY W/ BIOPSIES AND POLYPECTOMY  2015   1 polyp removed- repeat 3 years  . CORONARY ANGIOPLASTY WITH STENT PLACEMENT  ~ 2014  . EP IMPLANTABLE DEVICE N/A 11/10/2015   Procedure: Pacemaker Implant;  Surgeon: Will [name], MD;  Location: [facility];  Service: Cardiovascular;  Laterality: N/A;  . EP IMPLANTABLE DEVICE N/A 11/11/2015   Procedure: Lead Revision/Repair;  Surgeon: Evans Lance, MD;  Location: [facility];  Service: Cardiovascular;  Laterality: N/A;  . INGUINAL HERNIA REPAIR Left 1951  . JOINT REPLACEMENT    . LAPAROSCOPIC CHOLECYSTECTOMY    . MELANOMA EXCISION  ~ 2012   "off my back" (10/05/2017)  . PARATHYROIDECTOMY     "removed 3 glands; still have 1 gland" (10/05/2017)  . RIGHT/LEFT HEART CATH AND CORONARY ANGIOGRAPHY N/A 05/03/2017   Procedure: RIGHT/LEFT HEART CATH AND CORONARY ANGIOGRAPHY;  Surgeon: Leonie Man, MD;  Location: [facility];  Service: Cardiovascular;  Laterality: N/A;  . TONSILLECTOMY    . TOTAL SHOULDER ARTHROPLASTY Right x 2  . VAGINAL HYSTERECTOMY     "total"    FAMILY HISTORY: Family History  Problem Relation Age of Onset  . Thyroid disease Mother 38       3 days after surgery  . Heart defect Father 4       ?ascending aortic aneurysm  . Breast cancer Sister   . Hepatitis Brother   . Colon cancer Maternal Grandmother   . Diabetes Maternal Grandmother   . Diabetes Maternal Grandfather   . Colon cancer Paternal Grandmother        50's  . Rectal cancer Son 26  . Breast cancer Half-Sister   .  Diabetes Paternal Uncle   . Dementia Paternal Uncle   . Colon polyps Neg Hx   . Stomach cancer Neg Hx   . Liver cancer Neg Hx   . Pancreatic cancer Neg Hx     SOCIAL HISTORY: Social History   Socioeconomic History  . Marital status: Married    Spouse name: Barbarann Ehlers  . Number of children: 3  . Years of education: Not on file  . Highest education level: Master's degree (e.g., MA, MS, MEng, MEd, MSW, MBA)  Occupational History  . Occupation: Retired    Comment: Therapist, sports  Tobacco Use  . Smoking status: Former Smoker    Packs/day: 2.00    Years: 3.00    Pack years: 6.00    Types: Cigarettes    Quit date: 09/25/1968    Years since quitting: 50.5  . Smokeless tobacco: Never Used  . Tobacco comment: 10/05/2017 "smoked in my 20's"  Substance and Sexual Activity  . Alcohol use: Not Currently    Comment: 10/05/2017 "1 mixed drink q 2-3 months"  . Drug use: Never  . Sexual activity: Not on file  Other Topics Concern  . Not on file  Social History Narrative   Moved from [facility] - originally from [place] - daughter lives in [place]   03/20/19 Lives in [place] w/ husband - he is demented- fairly functional   Retired Therapist, sports, Scientist, physiological also - variety of experiences   2 sons 1 daughter   Social Determinants of Radio broadcast assistant Strain:   . Difficulty of Paying Living Expenses: Not on file  Food Insecurity:   . Worried About Charity fundraiser in the Last Year: Not on file  . Ran Out of Food in the Last Year: Not on file  Transportation Needs:   . Lack of Transportation (Medical): Not on file  . Lack of Transportation (Non-Medical): Not on file  Physical Activity:   . Days of Exercise per Week: Not on file  . Minutes of Exercise per Session: Not on file  Stress:   . Feeling of  Stress : Not on file  Social Connections:   . Frequency of Communication with Friends and Family: Not on file  . Frequency of Social Gatherings with Friends and Family: Not on file  . Attends Religious  Services: Not on file  . Active Member of Clubs or Organizations: Not on file  . Attends Archivist Meetings: Not on file  . Marital Status: Not on file  Intimate Partner Violence:   . Fear of Current or Ex-Partner: Not on file  . Emotionally Abused: Not on file  . Physically Abused: Not on file  . Sexually Abused: Not on file     PHYSICAL EXAM  GENERAL EXAM/CONSTITUTIONAL: Vitals:  Vitals:   03/20/19 1418  BP: (!) 169/83  Pulse: (!) 55  Temp: (!) 97.5 F (36.4 C)  Weight: 185 lb (83.9 kg)  Height: 5\' 3"  (1.6 m)   Body mass index is 32.77 kg/m. Wt Readings from Last 3 Encounters:  03/20/19 185 lb (83.9 kg)  01/01/19 188 lb (85.3 kg)  12/27/18 195 lb 6.4 oz (88.6 kg)    Patient is in no distress; well developed, nourished and groomed; neck is supple  CARDIOVASCULAR:  Examination of carotid arteries is normal; no carotid bruits  Regular rate and rhythm, no murmurs  Examination of peripheral vascular system by observation and palpation is normal  EYES:  Ophthalmoscopic exam of optic discs and posterior segments is normal; no papilledema or hemorrhages No exam data present  MUSCULOSKELETAL:  Gait, strength, tone, movements noted in Neurologic exam below  NEUROLOGIC: MENTAL STATUS:  MMSE - [place] Exam 03/20/2019 09/26/2018  Orientation to time 5 5  Orientation to Place 4 2  Registration 3 3  Attention/ Calculation 5 3  Recall 3 3  Language- name 2 objects 2 2  Language- repeat 1 1  Language- follow 3 step command 3 3  Language- read & follow direction 1 1  Write a sentence 1 1  Copy design 1 1  Total score 29 25    awake, alert, oriented to person, place and time  recent and remote memory intact  normal attention and concentration  language fluent, comprehension intact, naming intact  fund of knowledge appropriate  CRANIAL NERVE:   2nd - no papilledema on fundoscopic exam  2nd, 3rd, 4th, 6th - pupils equal and reactive to  light, visual fields full to confrontation, extraocular muscles intact, no nystagmus  5th - facial sensation symmetric  7th - facial strength symmetric  8th - hearing intact  9th - palate elevates symmetrically, uvula midline  11th - shoulder shrug symmetric  12th - tongue protrusion midline  MOTOR:   normal bulk and tone, full strength in the BUE, BLE  SENSORY:   normal and symmetric to light touch, temperature, vibration; EXCEPT DECR IN LEGS  COORDINATION:   finger-nose-finger, fine finger movements normal  REFLEXES:   deep tendon reflexes TRACE and symmetric  GAIT/STATION:   narrow based gait     DIAGNOSTIC DATA (LABS, IMAGING, TESTING) - I reviewed patient records, labs, notes, testing and imaging myself where available.  Lab Results  Component Value Date   WBC 6.4 10/21/2018   HGB 13.3 10/21/2018   HCT 39.0 10/21/2018   MCV 83.8 10/21/2018   PLT 183 10/21/2018      Component Value Date/Time   NA 142 10/21/2018 1624   NA 142 04/18/2018 0950   K 3.5 10/21/2018 1624   CL 106 10/21/2018 1624   CO2 24 10/21/2018 1609  GLUCOSE 153 (H) 10/21/2018 1624   BUN 15 10/21/2018 1624   BUN 16 04/18/2018 0950   CREATININE 0.90 10/21/2018 1624   CALCIUM 8.9 10/21/2018 1609   PROT 6.7 10/21/2018 1609   PROT 7.1 04/18/2017 0959   ALBUMIN 3.7 10/21/2018 1609   ALBUMIN 4.5 04/18/2017 0959   AST 23 10/21/2018 1609   ALT 14 10/21/2018 1609   ALKPHOS 172 (H) 10/21/2018 1609   BILITOT 0.6 10/21/2018 1609   BILITOT 0.5 04/18/2017 0959   GFRNONAA 57 (L) 10/21/2018 1609   GFRAA >60 10/21/2018 1609   Lab Results  Component Value Date   CHOL 127 04/19/2016   HDL 42 04/19/2016   LDLCALC 43 04/19/2016   TRIG 209 (H) 04/19/2016   CHOLHDL 3.0 04/19/2016   Lab Results  Component Value Date   HGBA1C 6.5 (A) 12/27/2018   Lab Results  Component Value Date   [id number] 10/02/2018   Lab Results  Component Value Date   TSH 0.05 (L) 12/27/2018    11/23/10  MRI brain  - mild chronic small vessel ischemic disease  11/23/10 MRA head - no stenosis  10/21/18 CT head [I reviewed images myself and agree with interpretation. -VRP]  - No acute intracranial abnormality.   ASSESSMENT AND PLAN  79 y.o. year old female here with mild memory loss for past 3 years, with no major changes in ADLs.   Dx:  1. MCI (mild cognitive impairment)   2. Memory loss     PLAN:  MILD MEMORY LOSS (likely MCI; insomnia and stress also may be factors) - safety / supervision issues reviewed - encouraged brain healthy activities - caution with driving and finances  Return for return to PCP, pending if symptoms worsen or fail to improve.  I spent 25 minutes of face-to-face and non-face-to-face time with patient.  This included previsit chart review, lab review, study review, order entry, electronic health record documentation, patient education.       Penni Bombard, MD [id number], [id number] PM Certified in Neurology, Neurophysiology and Neuroimaging  [facility] [address] [place], [place] [address] [phone]

## 2019-03-21 ENCOUNTER — Encounter: Payer: Self-pay | Admitting: Internal Medicine

## 2019-03-21 ENCOUNTER — Telehealth (INDEPENDENT_AMBULATORY_CARE_PROVIDER_SITE_OTHER): Payer: Medicare Other | Admitting: Internal Medicine

## 2019-03-21 ENCOUNTER — Other Ambulatory Visit: Payer: Self-pay

## 2019-03-21 ENCOUNTER — Other Ambulatory Visit: Payer: Self-pay | Admitting: Internal Medicine

## 2019-03-21 VITALS — BP 123/72 | HR 55 | Ht 63.0 in | Wt 185.0 lb

## 2019-03-21 DIAGNOSIS — Z7901 Long term (current) use of anticoagulants: Secondary | ICD-10-CM

## 2019-03-21 DIAGNOSIS — I5032 Chronic diastolic (congestive) heart failure: Secondary | ICD-10-CM | POA: Diagnosis not present

## 2019-03-21 DIAGNOSIS — I4819 Other persistent atrial fibrillation: Secondary | ICD-10-CM | POA: Diagnosis not present

## 2019-03-21 DIAGNOSIS — I110 Hypertensive heart disease with heart failure: Secondary | ICD-10-CM

## 2019-03-21 DIAGNOSIS — I951 Orthostatic hypotension: Secondary | ICD-10-CM

## 2019-03-21 DIAGNOSIS — G4733 Obstructive sleep apnea (adult) (pediatric): Secondary | ICD-10-CM

## 2019-03-21 DIAGNOSIS — E890 Postprocedural hypothyroidism: Secondary | ICD-10-CM

## 2019-03-21 DIAGNOSIS — I2510 Atherosclerotic heart disease of native coronary artery without angina pectoris: Secondary | ICD-10-CM

## 2019-03-21 DIAGNOSIS — Z87891 Personal history of nicotine dependence: Secondary | ICD-10-CM

## 2019-03-21 DIAGNOSIS — Z950 Presence of cardiac pacemaker: Secondary | ICD-10-CM

## 2019-03-21 DIAGNOSIS — Z955 Presence of coronary angioplasty implant and graft: Secondary | ICD-10-CM

## 2019-03-21 DIAGNOSIS — I495 Sick sinus syndrome: Secondary | ICD-10-CM

## 2019-03-21 DIAGNOSIS — Z7189 Other specified counseling: Secondary | ICD-10-CM

## 2019-03-21 LAB — TSH: TSH: 0.2 u[IU]/mL — ABNORMAL LOW (ref 0.35–4.50)

## 2019-03-21 LAB — T4, FREE: Free T4: 1.25 ng/dL (ref 0.60–1.60)

## 2019-03-21 MED ORDER — LEVOTHYROXINE SODIUM 112 MCG PO TABS: 112.0000 ug | ORAL_TABLET | Freq: Every day | ORAL | 3 refills | Status: DC

## 2019-03-21 NOTE — Progress Notes (Signed)
Electrophysiology TeleHealth Note   Due to national recommendations of social distancing due to COVID 19, an audio/video telehealth visit is felt to be most appropriate for this patient at this time.  See MyChart message from today for the patient's consent to telehealth for [facility].   Date:  03/21/2019   ID:  Dawn Garcia, DOB 04-09-1940, MRN [id number]  Location: patient's home  Provider location: [address] [place]  Evaluation Performed: Follow-up visit  PCP:  Perrin Maltese, MD  Cardiologist:     Electrophysiologist:  SK   Chief Complaint:     History of Present Illness:    Dawn Garcia is a 79 y.o. female who presents via audio/video conferencing for a telehealth visit today.  Since last being seen in our clinic for diastolic heart failure, atrial fibrillation status post PVI x2  and orthostatic hypotension the patient reports doing exceptionally well  Rare edema, not on diuretics for months--diuresis spontaneously  Rare LH or DOE; typically with prolonged standing or sitting,about once per week, resolves with sitting for 30 min or so  No chest pain, occ tachy palps assoc with above.    No bleeding      DATE TEST    6/17 Echo EF 65 %   11/17 Myoview EF 54 % Anteroapical defect  8/18 Echo EF 50-55% possible wall motion abnormality-reviewed by TG no further workup necessary  2/19 Cath   moderate mid-to distal LAD with patent circumflex stent//LVEDP 8//RA 11-notably patient was in sinus rhythm  2/19` Echo  EF 50-55%      Date Cr K TSH Hgb  12/17      12/18 1.19 3.9    1/19 1.69 3.1  16.4  2/19 1.69 4.3    7/19 1.36 3.9  12.6  1/20   0.34   6/20 1.0 4.3  12.8     The patient denies symptoms of fevers, chills, cough, or new SOB worrisome for COVID 19.    Past Medical History:  Diagnosis Date  . Age-related macular degeneration, dry, right eye   . Age-related macular degeneration, wet,  left eye ([place])   . Arthritis    "all over; particularly in hands/fingers; all my joints" (10/05/2017)  . Atrial fibrillation ([place])   . CAD (coronary artery disease)   . CHF (congestive heart failure) (H[facility])     A-Fib  . Chronic kidney disease   . COPD (chronic obstructive pulmonary disease) ([place])    "very mild" (10/05/2017)  . Family history of adverse reaction to anesthesia    "daughter w/PONV & woke up during endoscopy" (10/05/2017)  . GERD (gastroesophageal reflux disease)   . Gout   . Graves' disease    S/P "radioactive tx"  . Heart disease   . History of blood transfusion    "related to shoulder replacement"   . History of gout    "no longer on daily RX" (10/05/2017)  . History of hiatal hernia   . Hyperlipidemia   . Hypertension   . Hypocalcemia   . Hypokalemia   . Hypothyroidism   . Macular degeneration   . Melanoma ([place]) ~ 2012   "off my back" (10/05/2017)  . Myocardial infarction [facility])    "was told I'd had one; don't really know when" (10/05/2017)  . Orthostatic hypotension 2020  . OSA on CPAP   . Pacemaker   . 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 Renal disorder   . 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 Sinus node dysfunction (H[facility])   . Type II diabetes mellitus ([place])   . Vitamin B12 deficiency   . Vitamin D deficiency     Past Surgical History:  Procedure Laterality Date  . APPENDECTOMY    . ATRIAL FIBRILLATION ABLATION N/A 10/05/2017   Procedure: ATRIAL FIBRILLATION ABLATION;  Surgeon: Constance Haw, MD;  Location: [facility];  Service: Cardiovascular;  Laterality: N/A;  . ATRIAL FIBRILLATION ABLATION  ~ 2014  . CARDIAC CATHETERIZATION  04/2017  . COLONOSCOPY W/ BIOPSIES AND POLYPECTOMY  2015   1 polyp removed- repeat 3 years  . CORONARY ANGIOPLASTY WITH STENT PLACEMENT  ~ 2014  . EP IMPLANTABLE DEVICE N/A 11/10/2015   Procedure: Pacemaker Implant;  Surgeon: Will [name], MD;  Location: [facility];  Service: Cardiovascular;  Laterality: N/A;  . EP IMPLANTABLE DEVICE N/A 11/11/2015   Procedure: Lead Revision/Repair;  Surgeon: Evans Lance, MD;  Location: [facility];  Service: Cardiovascular;  Laterality: N/A;  . INGUINAL HERNIA REPAIR Left 1951  . JOINT REPLACEMENT    . LAPAROSCOPIC CHOLECYSTECTOMY    . MELANOMA EXCISION  ~ 2012   "off my back" (10/05/2017)  . PARATHYROIDECTOMY     "removed 3 glands; still have 1 gland" (10/05/2017)  . RIGHT/LEFT HEART CATH AND CORONARY ANGIOGRAPHY N/A 05/03/2017   Procedure: RIGHT/LEFT HEART CATH AND CORONARY ANGIOGRAPHY;  Surgeon: Leonie Man, MD;  Location: [facility];  Service: Cardiovascular;  Laterality: N/A;  . TONSILLECTOMY    . TOTAL SHOULDER ARTHROPLASTY Right x 2  . VAGINAL HYSTERECTOMY     "total"    Current Outpatient Medications  Medication Sig Dispense Refill  . Ascorbic Acid (VITAMIN C PO) Take by mouth daily.    . carboxymethylcellulose (REFRESH PLUS) 0.5 % SOLN Place 1 drop into both eyes 3 (three) times daily as needed.    . Cholecalciferol (VITAMIN D3) 5000 units TABS Take 1 tablet by mouth every morning.    . diltiazem (CARDIZEM) 30 MG tablet Take 1 tablet every 4-6 hours AS NEEDED for AFIB 45 tablet 1  . ELIQUIS 5 MG TABS tablet TAKE ONE TABLET TWICE DAILY 180 tablet 1  . Insulin Degludec (TRESIBA FLEXTOUCH) 200 UNIT/ML SOPN Inject 18 Units into the skin daily. (Patient taking differently: Inject 10-18 Units into the skin daily. ) 3 pen 5  . Insulin Pen Needle 32G X 4 MM MISC Use 1x a day 100 each 3  . Insulin Syringe-Needle U-100 (INSULIN SYRINGE .3CC/31GX5/16") 31G X 5/16" 0.3 ML MISC Use to inject insulin twice daily 200 each 3  . levothyroxine (SYNTHROID) 125 MCG tablet Take 1 tablet (125 mcg total) by mouth daily. 45 tablet 3  . Multiple Vitamins-Minerals (ICAPS AREDS 2) CAPS Take 1 capsule by mouth 2 (two) times daily.    . Multiple Vitamins-Minerals (ZINC PO) Take by mouth daily.    .  nitroGLYCERIN (NITROSTAT) 0.4 MG SL tablet Place 0.4 mg under the tongue every 5 (five) minutes as needed for chest pain.    Marland Kitchen OZEMPIC, 1 MG/DOSE, 2 MG/1.5ML SOPN INJECT 1MG  INTO THE SKIN ONCE A WEEK 3 mL 2  . pantoprazole (PROTONIX) 40 MG tablet Take 1 tablet (40 mg total) by mouth daily. 90 tablet 3  . potassium chloride (KLOR-CON) 8 MEQ tablet TAKE TWO TABLETS TWICE A DAY ON THE DAYSYOU TAKE TORSEMIDE (Patient taking differently: Take 8 mEq by mouth daily. ON  THE DAYS YOU TAKE TORSEMIDE) 180 tablet 3  . pyridostigmine (MESTINON) 60 MG tablet Take 0.5 tablets (30 mg total) by mouth 2 (two) times a day. 30 tablet 6  . rosuvastatin (CRESTOR) 40 MG tablet TAKE ONE TABLET BY MOUTH EVERY DAY 90 tablet 0  . vitamin B-12 (CYANOCOBALAMIN) 500 MCG tablet Take 500 mcg by mouth daily.     No current facility-administered medications for this visit.    Allergies:   Demerol [meperidine], Multaq [dronedarone], Sulfa antibiotics, Tetracyclines & related, and Epinephrine   Social History:  The patient  reports that she quit smoking about 50 years ago. Her smoking use included cigarettes. She has a 6.00 pack-year smoking history. She has never used smokeless tobacco. She reports previous alcohol use. She reports that she does not use drugs.   Family History:  The patient's   family history includes Breast cancer in her half-sister and sister; Colon cancer in her maternal grandmother and paternal grandmother; Dementia in her paternal uncle; Diabetes in her maternal grandfather, maternal grandmother, and paternal uncle; Heart defect (age of onset: 71) in her father; Hepatitis in her brother; Rectal cancer (age of onset: 53) in her son; Thyroid disease (age of onset: 34) in her mother.   ROS:  Please see the history of present illness.   All other systems are personally reviewed and negative.    Exam:    Vital Signs:  BP 123/72 (BP Location: Left Arm, Patient Position: Standing, Cuff Size: Normal)   Pulse (!)  55   Ht 5\' 3"  (1.6 m)   Wt 185 lb (83.9 kg)   BMI 32.77 kg/m     Well appearing, alert and conversant, regular work of breathing,  good skin color Eyes- anicteric, neuro- grossly intact, skin- no apparent rash or lesions or cyanosis, mouth- oral mucosa is pink   Labs/Other Tests and Data Reviewed:    Recent Labs: 10/21/2018: ALT 14; BUN 15; Creatinine, Ser 0.90; Hemoglobin 13.3; Platelets 183; Potassium 3.5; Sodium 142 12/27/2018: TSH 0.05   Wt Readings from Last 3 Encounters:  03/21/19 185 lb (83.9 kg)  03/20/19 185 lb (83.9 kg)  01/01/19 188 lb (85.3 kg)     Other studies personally reviewed: Additional studies/ records that were reviewed today include:  As above   Review of the above records today demonstrates:  Prior radiographs:  Pacemaker report 1/21 reviewed  No atrial fib, Mode switch was programmed low to help identify the mechanism of her tachycardia, now presumed sinus 2/2 orthostasis.  Device function ow normal     ASSESSMENT & PLAN:    Sick Sinus syndrome  Persistent Atrial Fib with prior PVI and redo 7/19  Pacemaker [employer]   OSA  P-wave oversensing resulting false detection of atrial fibrillation  HFpEF   chronic   Coronary artery disease with prior stenting   Orthostatic intolerance   Morbidly obese   No intercurrent atrial fibrillation or flutter  On Anticoagulation;  No bleeding issues   Without symptoms of ischemia  Device function normal  Orthostasis much improved on low dose pyridostigmine and compressive wear.  Given infrequency of symptoms, will defer uptitration of meds  Interval 20-25 ob weight loss since July 2020   COVID 19 screen The patient denies symptoms of COVID 19 at this time.  The importance of social distancing was discussed today.  Follow-up:  24 m in office    Current medicines are reviewed at length with the patient today.   The patient does not  have concerns regarding her medicines.  The following changes  were made today:  none  Labs/ tests ordered today include:   No orders of the defined types were placed in this encounter.   Future tests ( post COVID )     Patient Risk:  after full review of this patients clinical status, I feel that they are at moderate  risk at this time.  Today, I have spent 6  minutes with the patient with telehealth technology discussing the above.  Signed, Virl Axe, MD  03/21/2019 9:36 AM     [facility] [address] [place] [address] [phone] (office) [phone] (fax)

## 2019-03-21 NOTE — Patient Instructions (Signed)
Medication Instructions:  No medication changes.  *If you need a refill on your cardiac medications before your next appointment, please call your pharmacy*  Lab Work: None ordered.   If you have labs (blood work) drawn today and your tests are completely normal, you will receive your results only by: Marland Kitchen MyChart Message (if you have MyChart) OR . A paper copy in the mail If you have any lab test that is abnormal or we need to change your treatment, we will call you to review the results.  Testing/Procedures: None ordered  Follow-Up: At [facility], you and your health needs are our priority.  As part of our continuing mission to provide you with exceptional heart care, we have created designated Provider Care Teams.  These Care Teams include your primary Cardiologist (physician) and Advanced Practice Providers (APPs -  Physician Assistants and Nurse Practitioners) who all work together to provide you with the care you need, when you need it.  Your next appointment:   6 month(s)  The format for your next appointment:   In Person  Provider:   Virl Axe, MD  Other Instructions NA

## 2019-03-29 ENCOUNTER — Other Ambulatory Visit: Payer: Self-pay

## 2019-04-02 ENCOUNTER — Encounter: Payer: Self-pay | Admitting: Internal Medicine

## 2019-04-02 ENCOUNTER — Ambulatory Visit (INDEPENDENT_AMBULATORY_CARE_PROVIDER_SITE_OTHER): Payer: Medicare Other | Admitting: Internal Medicine

## 2019-04-02 VITALS — BP 140/70 | HR 57 | Ht 63.0 in | Wt 186.0 lb

## 2019-04-02 DIAGNOSIS — E890 Postprocedural hypothyroidism: Secondary | ICD-10-CM

## 2019-04-02 DIAGNOSIS — Z9009 Acquired absence of other part of head and neck: Secondary | ICD-10-CM

## 2019-04-02 DIAGNOSIS — E892 Postprocedural hypoparathyroidism: Secondary | ICD-10-CM

## 2019-04-02 DIAGNOSIS — E1121 Type 2 diabetes mellitus with diabetic nephropathy: Secondary | ICD-10-CM | POA: Diagnosis not present

## 2019-04-02 LAB — POCT GLYCOSYLATED HEMOGLOBIN (HGB A1C): Hemoglobin A1C: 7.1 % — AB (ref 4.0–5.6)

## 2019-04-02 NOTE — Progress Notes (Signed)
Patient ID: [facility], female   DOB: Feb 19, 1941, 79 y.o.   MRN: [id number]   This visit occurred during the SARS-CoV-2 public health emergency.  Safety protocols were in place, including screening questions prior to the visit, additional usage of staff PPE, and extensive cleaning of exam room while observing appropriate contact time as indicated for disinfecting solutions.   HPI  Dawn Garcia is a 79 y.o.-year-old female, initially referred by her PCP, Dr. Humphrey Rolls, presenting for follow-up for uncontrolled post ablative hypothyroidism and DM2, insulin-dependent since 2013, with long-term complications (atrial fibrillation, CHF, CKD).  Last visit 3 months ago.  At last visit, she was doing intermittent fasting with great success. She continue the intermittant fasting, but she reached a weight plateau in the last 2 months. Now 24h 2x a week.  Post ablative hypothyroidism: Pt. has been dx with Graves ds. At 79 y/o. She had RAI Tx in 1960 >> postablative hypothyroidism  >> started on levothyroxine.  Patient had consistently suppressed TSH levels over the years so we are not decreasing her doses very slowly due to fatigue.  We decreased her levothyroxine dose in 03/2019.  Pt is currently on levothyroxine 112 mcg daily, taken: - in am - fasting - at least 30 min from b'fast - no iron, multivitamins -She takes PPIs at night and calcium in the p.m. - not on Biotin  Of note, she was on amiodarone in 03/2017 >> stopped 04/2017 due to AKI.  Please  Reviewed her: Lab Results  Component Value Date   TSH 0.20 (L) 03/20/2019   TSH 0.05 (L) 12/27/2018   TSH 0.03 (L) 10/02/2018   TSH 0.34 (L) 03/27/2018   TSH 0.04 (L) 02/05/2018   TSH 0.198 (L) 04/26/2017   TSH 0.174 (L) 04/18/2017   TSH 0.034 (L) 03/10/2016   TSH 0.050 (L) 12/08/2015   TSH 0.061 (L) 11/08/2015   FREET4 1.25 03/20/2019   FREET4 1.70 (H) 12/27/2018   FREET4 1.35 10/02/2018   FREET4 1.55 03/27/2018   FREET4 1.40  02/05/2018   FREET4 3.54 (H) 04/26/2017   FREET4 2.08 (H) 11/08/2015   FREET4 2.18 (H) 09/09/2015   FREET4 0.93 08/19/2015   T3FREE 3.4 02/05/2018   T3FREE 1.8 (L) 04/26/2017   T3FREE 3.0 11/08/2015   T3FREE 2.8 09/09/2015   T3FREE 3.1 08/19/2015  10/25/2017: TSH 0.021 (0.45-5.33), free T4 1.7 (0.66-1.14)  Her Graves' antibodies were not elevated Lab Results  Component Value Date   TSI 102 02/05/2018    No results found for: THGAB No components found for: TPOAB  Pt denies: - feeling nodules in neck - hoarseness - dysphagia - choking - SOB with lying down  She has + FH of thyroid disorders in: mother (died after sx for toxic goiter) and sister (goiter).  No family history of thyroid cancer.  She has a history of radiation therapy to head and neck: Radioactive iodine treatment and high radiation therapy  She is not on biotin, no steroids, no herbal supplements.  She has Graves' ophthalmopathy >> had increased IO pressure and did not respond to IV steroids >> she had radiation therapy and then eyelid surgery.  DM2: -Insulin-dependent since 2013  Reviewed HbA1c levels: Lab Results  Component Value Date   HGBA1C 6.5 (A) 12/27/2018   HGBA1C 6.8 (A) 10/02/2018   HGBA1C 8.5 (A) 06/27/2018   HGBA1C 8.4 (A) 02/05/2018   HGBA1C 9.4 (H) 04/28/2017   HGBA1C 7.8 (H) 11/29/2016   HGBA1C 8.1 (H) 09/05/2016   HGBA1C  8.2 (H) 05/02/2016   HGBA1C 7.5 (H) 03/10/2016   HGBA1C 7.8 (H) 12/08/2015  08/14/2018: HbA1c 8.0%  10/25/2017: HbA1c 8.4%  At last visit she was on: - Novolin 70/30 -not taken in the day she is fasting: - 20 >> 15 units before b'fast - 16 >> 10 units before dinner - Ozempic 1 mg weekly in a.m.  Tried Jardiance >> diarrhea.  We changed to: - Tresiba 18 units daily - Ozempic 1 mg weekly  She checks her sugars twice a day: - am:  124-245 >> 92-151 >> 104-157, 171, 192  >> 132-171 - after b'fast: 168 - lunch: 201 >> n/c >> 131 >> 145, 149 - 2h after lunch:  151,  271 >> 110-155 >> 132 >> 115, 148 - before dinner: 141-328 (sick) >> 157-302 >> 150 >> n/c  - at bedtime: 225-338 >> 116-169 >> 95-117 >> 123-239  + History of CKD: Lab Results  Component Value Date   BUN 15 10/21/2018   BUN 12 10/21/2018   Lab Results  Component Value Date   CREATININE 0.90 10/21/2018   CREATININE 0.96 10/21/2018  10/25/2017: BUN/creatinine: 17/1.4, GFR 37  Latest eye exam 01/2018: + DR; getting intraocular injections.  She has a history of hyperparathyroidism - h/o first sx: 1978 (2 glands excised) - h/o second sx: 2010 (1 gland excised - 3.2 g)  She takes 1-2 calcium tablets a day and headaches are usually relieved by this.  Her ionized calcium was slightly low in the past but normalized at last check: Component     Latest Ref Rng & Units 10/02/2018  Calcium Ionized     4.8 - 5.6 mg/dL 4.89   Component     Latest Ref Rng & Units 02/05/2018 03/27/2018  Calcium Ionized     4.8 - 5.6 mg/dL 4.58 (L) 4.55 (L)   Total calcium level Lab Results  Component Value Date   CALCIUM 8.9 10/21/2018   CALCIUM 8.9 04/18/2018   CALCIUM 8.7 04/03/2018   CALCIUM 9.1 09/13/2017   CALCIUM 9.1 05/19/2017   Latest vitamin D levels were normal: Lab Results  Component Value Date   VD25OH 57.76 10/02/2018   VD25OH 80.87 03/27/2018   VD25OH 59.0 08/19/2015   She was on 1000 units vitamin D daily but this was stopped by PCP as her vitamin D was 80.  She would like me to manage her vitamin D deficiency. We restarted the 1000 units daily.   She has a history of B12 deficiency-managed by PCP.  Latest level normal: Lab Results  Component Value Date   VITAMINB12 633 10/02/2018   VITAMINB12 >2000 (H) 08/19/2015   She also has a history of OSA-on CPAP, OA, history of melanoma, macular degeneration.  She has a history of MCI.  ROS: Constitutional: no weight gain/+ weight loss, no fatigue, no subjective hyperthermia, no subjective hypothermia Eyes: no blurry vision, no  xerophthalmia ENT: no sore throat, + see HPI Cardiovascular: no CP/no SOB/no palpitations/no leg swelling Respiratory: no cough/no SOB/no wheezing Gastrointestinal: no N/no V/no D/no C/no acid reflux Musculoskeletal: no muscle aches/no joint aches Skin: no rashes, no hair loss Neurological: no tremors/no numbness/no tingling/no dizziness  I reviewed pt's medications, allergies, PMH, social hx, family hx, and changes were documented in the history of present illness. Otherwise, unchanged from my initial visit note.  Past Medical History:  Diagnosis Date  . Age-related macular degeneration, dry, right eye   . Age-related macular degeneration, wet, left eye ([place])   . Arthritis    "  all over; particularly in hands/fingers; all my joints" (10/05/2017)  . Atrial fibrillation ([place])   . CAD (coronary artery disease)   . CHF (congestive heart failure) (HCC)     A-Fib  . Chronic kidney disease   . COPD (chronic obstructive pulmonary disease) ([place])    "very mild" (10/05/2017)  . Family history of adverse reaction to anesthesia    "daughter w/PONV & woke up during endoscopy" (10/05/2017)  . GERD (gastroesophageal reflux disease)   . Gout   . Graves' disease    S/P "radioactive tx"  . Heart disease   . History of blood transfusion    "related to shoulder replacement"   . History of gout    "no longer on daily RX" (10/05/2017)  . History of hiatal hernia   . Hyperlipidemia   . Hypertension   . Hypocalcemia   . Hypokalemia   . Hypothyroidism   . Macular degeneration   . Melanoma ([place]) ~ 2012   "off my back" (10/05/2017)  . Myocardial infarction [facility])    "was told I'd had one; don't really know when" (10/05/2017)  . Orthostatic hypotension 2020  . OSA on CPAP   . Pacemaker   . 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 Renal disorder   . 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 Sinus node dysfunction (HCC)   . Type II diabetes mellitus ([place])   . Vitamin  B12 deficiency   . Vitamin D deficiency    Past Surgical History:  Procedure Laterality Date  . APPENDECTOMY    . ATRIAL FIBRILLATION ABLATION N/A 10/05/2017   Procedure: ATRIAL FIBRILLATION ABLATION;  Surgeon: Constance Haw, MD;  Location: [place] [facility];  Service: Cardiovascular;  Laterality: N/A;  . ATRIAL FIBRILLATION ABLATION  ~ 2014  . CARDIAC CATHETERIZATION  04/2017  . COLONOSCOPY W/ BIOPSIES AND POLYPECTOMY  2015   1 polyp removed- repeat 3 years  . CORONARY ANGIOPLASTY WITH STENT PLACEMENT  ~ 2014  . EP IMPLANTABLE DEVICE N/A 11/10/2015   Procedure: Pacemaker Implant;  Surgeon: Will [name], MD;  Location: [place] [facility];  Service: Cardiovascular;  Laterality: N/A;  . EP IMPLANTABLE DEVICE N/A 11/11/2015   Procedure: Lead Revision/Repair;  Surgeon: Evans Lance, MD;  Location: [place] [facility];  Service: Cardiovascular;  Laterality: N/A;  . INGUINAL HERNIA REPAIR Left 1951  . JOINT REPLACEMENT    . LAPAROSCOPIC CHOLECYSTECTOMY    . MELANOMA EXCISION  ~ 2012   "off my back" (10/05/2017)  . PARATHYROIDECTOMY     "removed 3 glands; still have 1 gland" (10/05/2017)  . RIGHT/LEFT HEART CATH AND CORONARY ANGIOGRAPHY N/A 05/03/2017   Procedure: RIGHT/LEFT HEART CATH AND CORONARY ANGIOGRAPHY;  Surgeon: Leonie Man, MD;  Location: [place] [facility];  Service: Cardiovascular;  Laterality: N/A;  . TONSILLECTOMY    . TOTAL SHOULDER ARTHROPLASTY Right x 2  . VAGINAL HYSTERECTOMY     "total"   Social History   Socioeconomic History  . Marital status: Married    Spouse name: Not on file  . Number of children: 3  . Years of education: Not on file  . Highest education level: Not on file  Occupational History  . Occupation: Retired Animal nutritionist  . Financial resource strain: Not on file  . Food insecurity:    Worry: Not on file    Inability: Not on file  . Transportation needs:    Medical: Not on  file    Non-medical: Not on file  Tobacco Use  .  Smoking status: Former Smoker, quit in 1967    Packs/day: 2.00    Years: 3.00    Pack years: 6.00    Types: Cigarettes  . Smokeless tobacco: Never Used  . Tobacco comment: 10/05/2017 "smoked in my 20's"  Substance and Sexual Activity  . Alcohol use: Yes    Comment: 1-2 mixed drinks per month  . Drug use: Never  Lifestyle  Relationships  Social History Narrative   Lives in [place] w/ husband.  She walks her dog every day.  Current Outpatient Medications on File Prior to Visit  Medication Sig Dispense Refill  . Ascorbic Acid (VITAMIN C PO) Take by mouth daily.    . carboxymethylcellulose (REFRESH PLUS) 0.5 % SOLN Place 1 drop into both eyes 3 (three) times daily as needed.    . Cholecalciferol (VITAMIN D3) 5000 units TABS Take 1 tablet by mouth every morning.    . diltiazem (CARDIZEM) 30 MG tablet Take 1 tablet every 4-6 hours AS NEEDED for AFIB 45 tablet 1  . ELIQUIS 5 MG TABS tablet TAKE ONE TABLET TWICE DAILY 180 tablet 1  . Insulin Degludec (TRESIBA FLEXTOUCH) 200 UNIT/ML SOPN Inject 18 Units into the skin daily. (Patient taking differently: Inject 10-18 Units into the skin daily. ) 3 pen 5  . Insulin Pen Needle 32G X 4 MM MISC Use 1x a day 100 each 3  . Insulin Syringe-Needle U-100 (INSULIN SYRINGE .3CC/31GX5/16") 31G X 5/16" 0.3 ML MISC Use to inject insulin twice daily 200 each 3  . levothyroxine (SYNTHROID) 112 MCG tablet Take 1 tablet (112 mcg total) by mouth daily. 45 tablet 3  . Multiple Vitamins-Minerals (ICAPS AREDS 2) CAPS Take 1 capsule by mouth 2 (two) times daily.    . Multiple Vitamins-Minerals (ZINC PO) Take by mouth daily.    . nitroGLYCERIN (NITROSTAT) 0.4 MG SL tablet Place 0.4 mg under the tongue every 5 (five) minutes as needed for chest pain.    Marland Kitchen OZEMPIC, 1 MG/DOSE, 2 MG/1.5ML SOPN INJECT 1MG  INTO THE SKIN ONCE A WEEK 3 mL 2  . pantoprazole (PROTONIX) 40 MG tablet Take 1 tablet (40 mg total) by mouth daily. 90 tablet 3  . potassium chloride (KLOR-CON) 8 MEQ  tablet TAKE TWO TABLETS TWICE A DAY ON THE DAYSYOU TAKE TORSEMIDE (Patient taking differently: Take 8 mEq by mouth daily. ON THE DAYS YOU TAKE TORSEMIDE) 180 tablet 3  . pyridostigmine (MESTINON) 60 MG tablet Take 0.5 tablets (30 mg total) by mouth 2 (two) times a day. 30 tablet 6  . rosuvastatin (CRESTOR) 40 MG tablet TAKE ONE TABLET BY MOUTH EVERY DAY 90 tablet 0  . vitamin B-12 (CYANOCOBALAMIN) 500 MCG tablet Take 500 mcg by mouth daily.     No current facility-administered medications on file prior to visit.   Allergies  Allergen Reactions  . Demerol [Meperidine] Anaphylaxis    Tolerated Fentanyl 11/10/15  . Multaq [Dronedarone] Diarrhea  . Sulfa Antibiotics Anaphylaxis  . Tetracyclines & Related Other (See Comments) and Swelling    Made nose, lips,  And tongue itchy Made nose, lips,  And tongue itchy  . Epinephrine     Powder- SOB, palpitations   Family History  Problem Relation Age of Onset  . Thyroid disease Mother 38       3 days after surgery  . Heart defect Father 69       ?ascending aortic aneurysm  . Breast  cancer Sister   . Hepatitis Brother   . Colon cancer Maternal Grandmother   . Diabetes Maternal Grandmother   . Diabetes Maternal Grandfather   . Colon cancer Paternal Grandmother        59's  . Rectal cancer Son 67  . Breast cancer Half-Sister   . Diabetes Paternal Uncle   . Dementia Paternal Uncle   . Colon polyps Neg Hx   . Stomach cancer Neg Hx   . Liver cancer Neg Hx   . Pancreatic cancer Neg Hx   Also: -Diabetes in maternal grandparents -Hypertension and uncle-hyperlipidemia in the whole family -Heart disease in father, mother, uncle-thyroid problems in mother and sister-cancer in son, grandmother, half sister  PE: BP 140/70   Pulse (!) 57   Ht 5\' 3"  (1.6 m)   Wt 186 lb (84.4 kg)   SpO2 97%   BMI 32.95 kg/m  Wt Readings from Last 3 Encounters:  04/02/19 186 lb (84.4 kg)  03/21/19 185 lb (83.9 kg)  03/20/19 185 lb (83.9 kg)    Constitutional: overweight, in NAD Eyes: PERRLA, EOMI, no exophthalmos ENT: moist mucous membranes, no thyromegaly, no cervical lymphadenopathy Cardiovascular: RRR, No MRG Respiratory: CTA B Gastrointestinal: abdomen soft, NT, ND, BS+ Musculoskeletal: no deformities, strength intact in all 4 Skin: moist, warm, no rashes Neurological: no tremor with outstretched hands, DTR normal in all 4  ASSESSMENT: 1. Postablative Hypothyroidism  2.  Insulin-dependent DM2  3.  History of hypocalcemia -History of hyperparathyroidism  PLAN:  1. Patient with longstanding, uncontrolled, possibly with hypothyroidism, with a long history of suppressed TSH, now in the process of correcting this to the normal range.  She is aware of possible side effects of over replacement with thyroid hormones to include cardiac arrhythmia (she has a history of atrial fibrillation), hypercoagulability and, in the long run, osteoporosis and increased mortality. - latest thyroid labs reviewed with pt >> still low at last check earlier this month: Lab Results  Component Value Date   TSH 0.20 (L) 03/20/2019   - she continues on LT4 112 mcg daily (decreased 03/20/2019) - pt feels good on this dose. - we discussed about taking the thyroid hormone every day, with water, >30 minutes before breakfast, separated by >4 hours from acid reflux medications, calcium, iron, multivitamins. Pt. is taking it correctly. - will check thyroid tests in 1 month  2. DM2 -insulin-dependent -Patient with longstanding, uncontrolled, type 2 diabetes, previously on the premixed insulin regimen, with significantly improved blood sugars after she started intermittent fasting.  At last visit, we stopped the 70/30 insulin and started [place].  I advised her to use lower Tresiba doses in the days that she is fasting.  Before last visit she lost 22 pounds with intermittent fasting and her sugars improved.  At last visit, sugars were better but still higher  than goal in the morning. -Since last visit, lost 9 lbs in last 3 months! -At this visit, sugars are higher than goal as she relaxed her diet during the holidays, despite continuing her fasting.  She ate more sweets, which she is now cutting down.  Before the holidays, sugars were at or close to goal, only slightly higher in the morning.  At this visit, I advised her to increase the dose slightly.  Since she does not have low blood sugars during fasting, we will add a low-dose of insulin.  Since she has occasional blood sugars in the 200s, will increase Tresiba slightly even a nonfasting basis.  I advised her to let me know if she has any lows. - I suggested to:  Patient Instructions  Please continue: - Ozempic 1 mg weekly in a.m.  Please change: - Tresiba 6 units daily in the fasting days - Tresiba 20 units daily in nonfasting days  Please continue levothyroxine 112 mcg daily.  Take the thyroid hormone every day, with water, at least 30 minutes before breakfast, separated by at least 4 hours from: - acid reflux medications - calcium - iron - multivitamins  Please come back for labs in 1 month.  Please return in 3-4 months with your sugar log.   - we checked her HbA1c: 7.1% (higher) - advised to check sugars at different times of the day - 1-2x a day, rotating check times - advised for yearly eye exams >> she is not UTD - return to clinic in 3-4 months   3. H/o Hypocalcemia -  h/o hyperparathyroidism, now status post 3/4 parathyroidectomy -Latest calcium level was reviewed and this was normal -She takes 1 to 2 tablets of Tums, 1 with lunch and 1 later in the afternoon -Latest vitamin D level was normal last summer >> we will recheck in 1 month when she returns to the clinic for labs - continue 1000 units vit D daily  Orders Placed This Encounter  Procedures  . TSH  . T4, free  . VITAMIN D 25 Hydroxy (Vit-D Deficiency, Fractures)  . POCT glycosylated hemoglobin (Hb A1C)     Philemon Kingdom, MD PhD [facility] Endocrinology

## 2019-04-02 NOTE — Patient Instructions (Addendum)
Please continue: - Ozempic 1 mg weekly in a.m.  Please change: - Tresiba 6 units daily in the fasting days - Tresiba 20 units daily in nonfasting days  Please continue levothyroxine 112 mcg daily.  Take the thyroid hormone every day, with water, at least 30 minutes before breakfast, separated by at least 4 hours from: - acid reflux medications - calcium - iron - multivitamins  Please come back for labs in 1 month.  Please return in 3-4 months with your sugar log.

## 2019-04-09 ENCOUNTER — Other Ambulatory Visit: Payer: Self-pay | Admitting: Internal Medicine

## 2019-04-16 NOTE — Telephone Encounter (Signed)
OK to refill Mestinon per dr Klein/ner

## 2019-04-23 NOTE — Telephone Encounter (Signed)
Per additional MyChart message from patient on 04/22/19:  April 22, 2019 Belliss[place], Netty Starring to Deboraha Sprang, MD     5:38 PM Haven't had any new episodes for last couple days. I'll wait and send if they occur again if ok.

## 2019-04-25 ENCOUNTER — Telehealth: Payer: Self-pay

## 2019-04-25 NOTE — Telephone Encounter (Signed)
Patient called wanting to see about getting something to help her sleep.  She has slept 4 hours in the last 72 hr.  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  She can not take gabapentin  Please call patient back.     [facility] [place].

## 2019-04-25 NOTE — Telephone Encounter (Signed)
Sleep pbs - per PCP. Regarding neuropathy, she can try: - alpha-lipoic acid 600 mg 2x a day (this is over-the-counter) - B complex 1 tablet once a day  However, she needs to be off B complex for at least 3 days before her next appointment in case we need to repeat her thyroid tests.

## 2019-04-29 ENCOUNTER — Encounter: Payer: Self-pay | Admitting: Internal Medicine

## 2019-05-04 ENCOUNTER — Encounter: Payer: Self-pay | Admitting: Internal Medicine

## 2019-05-06 ENCOUNTER — Other Ambulatory Visit (INDEPENDENT_AMBULATORY_CARE_PROVIDER_SITE_OTHER): Payer: Medicare Other

## 2019-05-06 DIAGNOSIS — E892 Postprocedural hypoparathyroidism: Secondary | ICD-10-CM

## 2019-05-06 DIAGNOSIS — Z9009 Acquired absence of other part of head and neck: Secondary | ICD-10-CM | POA: Diagnosis not present

## 2019-05-06 DIAGNOSIS — E890 Postprocedural hypothyroidism: Secondary | ICD-10-CM

## 2019-05-06 LAB — HM DIABETES EYE EXAM

## 2019-05-06 LAB — VITAMIN D 25 HYDROXY (VIT D DEFICIENCY, FRACTURES): VITD: 69.65 ng/mL (ref 30.00–100.00)

## 2019-05-06 LAB — T4, FREE: Free T4: 1.13 ng/dL (ref 0.60–1.60)

## 2019-05-06 LAB — TSH: TSH: 0.71 u[IU]/mL (ref 0.35–4.50)

## 2019-05-09 ENCOUNTER — Encounter: Payer: Self-pay | Admitting: Internal Medicine

## 2019-05-10 ENCOUNTER — Encounter: Payer: Self-pay | Admitting: Internal Medicine

## 2019-05-10 NOTE — Telephone Encounter (Signed)
Pt  Was seen and had her lab done. I do not know why she was marked no show

## 2019-06-11 ENCOUNTER — Encounter: Payer: Self-pay | Admitting: Internal Medicine

## 2019-06-18 ENCOUNTER — Ambulatory Visit (INDEPENDENT_AMBULATORY_CARE_PROVIDER_SITE_OTHER): Payer: Medicare Other | Admitting: *Deleted

## 2019-06-18 DIAGNOSIS — I495 Sick sinus syndrome: Secondary | ICD-10-CM | POA: Diagnosis not present

## 2019-06-18 LAB — CUP PACEART REMOTE DEVICE CHECK
Battery Remaining Longevity: 128 mo
Battery Remaining Percentage: 95.5 %
Battery Voltage: 3.01 V
Brady Statistic AP VP Percent: 20 %
Brady Statistic AP VS Percent: 9.6 %
Brady Statistic AS VP Percent: 4.6 %
Brady Statistic AS VS Percent: 65 %
Brady Statistic RA Percent Paced: 28 %
Brady Statistic RV Percent Paced: 25 %
Date Time Interrogation Session: 20210406020014
Implantable Lead Implant Date: 20170829
Implantable Lead Implant Date: 20170829
Implantable Lead Location: 753859
Implantable Lead Location: 753860
Implantable Pulse Generator Implant Date: 20170829
Lead Channel Impedance Value: 390 Ohm
Lead Channel Impedance Value: 590 Ohm
Lead Channel Pacing Threshold Amplitude: 0.75 V
Lead Channel Pacing Threshold Amplitude: 0.875 V
Lead Channel Pacing Threshold Pulse Width: 0.4 ms
Lead Channel Pacing Threshold Pulse Width: 0.4 ms
Lead Channel Sensing Intrinsic Amplitude: 12 mV
Lead Channel Sensing Intrinsic Amplitude: 5 mV
Lead Channel Setting Pacing Amplitude: 1.875
Lead Channel Setting Pacing Amplitude: 2.5 V
Lead Channel Setting Pacing Pulse Width: 0.4 ms
Lead Channel Setting Sensing Sensitivity: 2 mV
Pulse Gen Model: 2272
Pulse Gen Serial Number: 7945290

## 2019-06-19 NOTE — Progress Notes (Signed)
PPM Remote  

## 2019-07-25 ENCOUNTER — Other Ambulatory Visit: Payer: Self-pay

## 2019-07-29 ENCOUNTER — Other Ambulatory Visit: Payer: Self-pay

## 2019-07-29 ENCOUNTER — Ambulatory Visit (INDEPENDENT_AMBULATORY_CARE_PROVIDER_SITE_OTHER): Payer: Medicare Other | Admitting: Internal Medicine

## 2019-07-29 ENCOUNTER — Encounter: Payer: Self-pay | Admitting: Internal Medicine

## 2019-07-29 VITALS — BP 120/78 | HR 65 | Ht 63.0 in | Wt 178.0 lb

## 2019-07-29 DIAGNOSIS — E559 Vitamin D deficiency, unspecified: Secondary | ICD-10-CM | POA: Diagnosis not present

## 2019-07-29 DIAGNOSIS — E1159 Type 2 diabetes mellitus with other circulatory complications: Secondary | ICD-10-CM

## 2019-07-29 DIAGNOSIS — Z9009 Acquired absence of other part of head and neck: Secondary | ICD-10-CM

## 2019-07-29 DIAGNOSIS — E1165 Type 2 diabetes mellitus with hyperglycemia: Secondary | ICD-10-CM | POA: Diagnosis not present

## 2019-07-29 DIAGNOSIS — E890 Postprocedural hypothyroidism: Secondary | ICD-10-CM

## 2019-07-29 DIAGNOSIS — E892 Postprocedural hypoparathyroidism: Secondary | ICD-10-CM

## 2019-07-29 LAB — POCT GLYCOSYLATED HEMOGLOBIN (HGB A1C): Hemoglobin A1C: 6.1 % — AB (ref 4.0–5.6)

## 2019-07-29 NOTE — Progress Notes (Signed)
Patient ID: Dawn Garcia [facility], female   DOB: [birth year], 79 y.o.   MRN: [id number]   This visit occurred during the SARS-CoV-2 public health emergency.  Safety protocols were in place, including screening questions prior to the visit, additional usage of staff PPE, and extensive cleaning of exam room while observing appropriate contact time as indicated for disinfecting solutions.   HPI  Dawn Garcia is a 79 y.o.-year-old female, initially referred by her PCP, Dr. Humphrey Rolls, presenting for follow-up for uncontrolled post ablative hypothyroidism and DM2, insulin-dependent since 2013, with long-term complications (atrial fibrillation, CHF, CKD).  Last visit 3 months ago.  She continues intermittent fasting, started 09/2018 - 24h x 2 days a week. Since she started, she lost ~40 lbs.  Post ablative hypothyroidism: Pt. has been dx with Graves ds. At 79 y/o. She had RAI Tx in 1960 >> postablative hypothyroidism  >> started on levothyroxine.  Patient had consistently suppressed TSH levels over the years so we are not decreasing her doses very slowly due to fatigue.  We decreased her levothyroxine dose in 03/2019.  Pt is on levothyroxine 112 mcg daily, taken: - in am - fasting - at least 30 min from b'fast - no Fe, MVI, + PPIs and calcium in the p.m. - + on Biotin - B complex >> stopped 1 week ago Of note, she was on amiodarone in 03/2017 >> stopped 04/2017 due to AKI.   Reviewed her TFTs: Lab Results  Component Value Date   TSH 0.71 05/06/2019   TSH 0.20 (L) 03/20/2019   TSH 0.05 (L) 12/27/2018   TSH 0.03 (L) 10/02/2018   TSH 0.34 (L) 03/27/2018   TSH 0.04 (L) 02/05/2018   TSH 0.198 (L) 04/26/2017   TSH 0.174 (L) 04/18/2017   TSH 0.034 (L) 03/10/2016   TSH 0.050 (L) 12/08/2015   FREET4 1.13 05/06/2019   FREET4 1.25 03/20/2019   FREET4 1.70 (H) 12/27/2018   FREET4 1.35 10/02/2018   FREET4 1.55 03/27/2018   FREET4 1.40 02/05/2018   FREET4 3.54 (H) 04/26/2017   FREET4 2.08 (H)  11/08/2015   FREET4 2.18 (H) 09/09/2015   FREET4 0.93 08/19/2015   T3FREE 3.4 02/05/2018   T3FREE 1.8 (L) 04/26/2017   T3FREE 3.0 11/08/2015   T3FREE 2.8 09/09/2015   T3FREE 3.1 08/19/2015  10/25/2017: TSH 0.021 (0.45-5.33), free T4 1.7 (0.66-1.14)  Her Graves' antibodies were not elevated: Lab Results  Component Value Date   TSI 102 02/05/2018    No results found for: THGAB No components found for: TPOAB  Pt denies: - feeling nodules in neck - hoarseness - dysphagia - choking - SOB with lying down  She has + FH of thyroid disorders in: mother (died after sx for toxic goiter) and sister (goiter).  No family history of thyroid cancer.  She has a history of radiation therapy to head and neck: RAI treatment.  She is not taking any steroids, or herbal supplements.  She has history of Graves' ophthalmopathy>> had increased IO pressure and did not respond to IV steroids >> she had radiation therapy and then eyelid surgery.  DM2: -Insulin-dependent since 2013  Reviewed HbA1c levels: Lab Results  Component Value Date   HGBA1C 7.1 (A) 04/02/2019   HGBA1C 6.5 (A) 12/27/2018   HGBA1C 6.8 (A) 10/02/2018   HGBA1C 8.5 (A) 06/27/2018   HGBA1C 8.4 (A) 02/05/2018   HGBA1C 9.4 (H) 04/28/2017   HGBA1C 7.8 (H) 11/29/2016   HGBA1C 8.1 (H) 09/05/2016   HGBA1C 8.2 (H) 05/02/2016  HGBA1C 7.5 (H) 03/10/2016  08/14/2018: HbA1c 8.0%  10/25/2017: HbA1c 8.4%  Previously on: - Novolin 70/30 -not taken in the day she is fasting: - 20 >> 15 units before b'fast - 16 >> 10 units before dinner - Ozempic 1 mg weekly in a.m.  Tried Jardiance >> diarrhea.  We changed to: - Tresiba 18 units daily >> 6-8 units in the fasting days, 20 units in the nonfasting days - Ozempic 1 mg weekly in a.m. >> stopped at the end of 06/2019 (run out)  She checks her sugars twice a day: - am: 92-151 >> 104-157, 171, 192  >> 132-171 >> 97, 101-124 - after b'fast: 168 - lunch: 201 >> n/c >> 131 >> 145, 149 >>  n/c - 2h after lunch: 110-155 >> 132 >> 115, 148 >> 108 - before dinner: 141-328 (sick) >> 157-302 >> 150 >> n/c >> 97, 161 - at bedtime: 225-338 >> 116-169 >> 95-117 >> 123-239 >> 93-138, 173  + History of CKD, but normalized BUN/creatinine: Lab Results  Component Value Date   BUN 15 10/21/2018   BUN 12 10/21/2018   Lab Results  Component Value Date   CREATININE 0.90 10/21/2018   CREATININE 0.96 10/21/2018  10/25/2017: BUN/creatinine: 17/1.4, GFR 37  Latest eye exam 04/2019: No DR!, previously: + DR; she was getting intraocular injections.  She has a history of hyperparathyroidism: - h/o first sx: 1978 (2 glands excised) - h/o second sx: 2010 (1 gland excised - 3.2 g)  She takes 1-2 calcium tablets a day and headaches are usually relieved by these.  Her ionized calcium was slightly low in the past but normalized at last check: Component     Latest Ref Rng & Units 10/02/2018  Calcium Ionized     4.8 - 5.6 mg/dL 4.89   Component     Latest Ref Rng & Units 02/05/2018 03/27/2018  Calcium Ionized     4.8 - 5.6 mg/dL 4.58 (L) 4.55 (L)   Total calcium level was normal: Lab Results  Component Value Date   CALCIUM 8.9 10/21/2018   CALCIUM 8.9 04/18/2018   CALCIUM 8.7 04/03/2018   CALCIUM 9.1 09/13/2017   CALCIUM 9.1 05/19/2017   At last visit she was telling me that she would want me to manage her vitamin D deficiency.  She was on 1000 units vitamin D daily but this was stopped by PCP as her vitamin D was 80. We restarted at 1000 units daily.   Latest vitamin D levels were normal: Lab Results  Component Value Date   VD25OH 69.65 05/06/2019   VD25OH 57.76 10/02/2018   VD25OH 80.87 03/27/2018   VD25OH 59.0 08/19/2015   She has a history of B12 deficiency-managed by PCP.  Latest level was normal: Lab Results  Component Value Date   VITAMINB12 633 10/02/2018   VITAMINB12 >2000 (H) 08/19/2015   She also has a history of OSA-on CPAP, OA, history of melanoma, macular  degeneration. She has MCI.  ROS: Constitutional: no weight gain/+ weight loss, + fatigue, no subjective hyperthermia, no subjective hypothermia Eyes: no blurry vision, no xerophthalmia ENT: no sore throat, + see HPI Cardiovascular: no CP/no SOB/no palpitations/no leg swelling Respiratory: no cough/no SOB/no wheezing Gastrointestinal: no N/no V/no D/no C/no acid reflux Musculoskeletal: no muscle aches/no joint aches Skin: no rashes, no hair loss Neurological: no tremors/no numbness/no tingling/no dizziness  I reviewed pt's medications, allergies, PMH, social hx, family hx, and changes were documented in the history of present illness. Otherwise, unchanged  from my initial visit note.  Past Medical History:  Diagnosis Date  . Age-related macular degeneration, dry, right eye   . Age-related macular degeneration, wet, left eye ([place])   . Arthritis    "all over; particularly in hands/fingers; all my joints" (10/05/2017)  . Atrial fibrillation ([place])   . CAD (coronary artery disease)   . CHF (congestive heart failure) (HCC)     A-Fib  . Chronic kidney disease   . COPD (chronic obstructive pulmonary disease) ([place])    "very mild" (10/05/2017)  . Family history of adverse reaction to anesthesia    "daughter w/PONV & woke up during endoscopy" (10/05/2017)  . GERD (gastroesophageal reflux disease)   . Gout   . Graves' disease    S/P "radioactive tx"  . Heart disease   . History of blood transfusion    "related to shoulder replacement"   . History of gout    "no longer on daily RX" (10/05/2017)  . History of hiatal hernia   . Hyperlipidemia   . Hypertension   . Hypocalcemia   . Hypokalemia   . Hypothyroidism   . Macular degeneration   . Melanoma ([place]) ~ 2012   "off my back" (10/05/2017)  . Myocardial infarction [facility])    "was told I'd had one; don't really know when" (10/05/2017)  . Orthostatic hypotension 2020  . OSA on CPAP   . Pacemaker   . Presence of permanent cardiac pacemaker  10/2015  . Renal disorder   . 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 Sinus node dysfunction (HCC)   . Type II diabetes mellitus ([place])   . Vitamin B12 deficiency   . Vitamin D deficiency    Past Surgical History:  Procedure Laterality Date  . APPENDECTOMY    . ATRIAL FIBRILLATION ABLATION N/A 10/05/2017   Procedure: ATRIAL FIBRILLATION ABLATION;  Surgeon: Constance Haw, MD;  Location: [place] [facility];  Service: Cardiovascular;  Laterality: N/A;  . ATRIAL FIBRILLATION ABLATION  ~ 2014  . CARDIAC CATHETERIZATION  04/2017  . COLONOSCOPY W/ BIOPSIES AND POLYPECTOMY  2015   1 polyp removed- repeat 3 years  . CORONARY ANGIOPLASTY WITH STENT PLACEMENT  ~ 2014  . EP IMPLANTABLE DEVICE N/A 11/10/2015   Procedure: Pacemaker Implant;  Surgeon: Will [name], MD;  Location: [place] [facility];  Service: Cardiovascular;  Laterality: N/A;  . EP IMPLANTABLE DEVICE N/A 11/11/2015   Procedure: Lead Revision/Repair;  Surgeon: Evans Lance, MD;  Location: [place] [facility];  Service: Cardiovascular;  Laterality: N/A;  . INGUINAL HERNIA REPAIR Left 1951  . JOINT REPLACEMENT    . LAPAROSCOPIC CHOLECYSTECTOMY    . MELANOMA EXCISION  ~ 2012   "off my back" (10/05/2017)  . PARATHYROIDECTOMY     "removed 3 glands; still have 1 gland" (10/05/2017)  . RIGHT/LEFT HEART CATH AND CORONARY ANGIOGRAPHY N/A 05/03/2017   Procedure: RIGHT/LEFT HEART CATH AND CORONARY ANGIOGRAPHY;  Surgeon: Leonie Man, MD;  Location: [place] [facility];  Service: Cardiovascular;  Laterality: N/A;  . TONSILLECTOMY    . TOTAL SHOULDER ARTHROPLASTY Right x 2  . VAGINAL HYSTERECTOMY     "total"   Social History   Socioeconomic History  . Marital status: Married    Spouse name: Not on file  . Number of children: 3  . Years of education: Not on file  . Highest education level: Not on file  Occupational History  . Occupation: Retired Hospital doctor  Needs  . Financial resource strain: Not on  file  . Food insecurity:    Worry: Not on file    Inability: Not on file  . Transportation needs:    Medical: Not on file    Non-medical: Not on file  Tobacco Use  . Smoking status: Former Smoker, quit in 1967    Packs/day: 2.00    Years: 3.00    Pack years: 6.00    Types: Cigarettes  . Smokeless tobacco: Never Used  . Tobacco comment: 10/05/2017 "smoked in my 20's"  Substance and Sexual Activity  . Alcohol use: Yes    Comment: 1-2 mixed drinks per month  . Drug use: Never  Lifestyle  Relationships  Social History Narrative   Lives in [place] w/ husband.  She walks her dog every day.  Current Outpatient Medications on File Prior to Visit  Medication Sig Dispense Refill  . Ascorbic Acid (VITAMIN C PO) Take by mouth daily.    . carboxymethylcellulose (REFRESH PLUS) 0.5 % SOLN Place 1 drop into both eyes 3 (three) times daily as needed.    . Cholecalciferol (VITAMIN D3) 5000 units TABS Take 1 tablet by mouth every morning.    . diltiazem (CARDIZEM) 30 MG tablet Take 1 tablet every 4-6 hours AS NEEDED for AFIB 45 tablet 1  . ELIQUIS 5 MG TABS tablet TAKE ONE TABLET TWICE DAILY 180 tablet 1  . Insulin Degludec (TRESIBA FLEXTOUCH) 200 UNIT/ML SOPN Inject 18 Units into the skin daily. (Patient taking differently: Inject 10-18 Units into the skin daily. ) 3 pen 5  . Insulin Pen Needle 32G X 4 MM MISC Use 1x a day 100 each 3  . Insulin Syringe-Needle U-100 (INSULIN SYRINGE .3CC/31GX5/16") 31G X 5/16" 0.3 ML MISC Use to inject insulin twice daily 200 each 3  . levothyroxine (SYNTHROID) 112 MCG tablet Take 1 tablet (112 mcg total) by mouth daily. 45 tablet 3  . Multiple Vitamins-Minerals (ICAPS AREDS 2) CAPS Take 1 capsule by mouth 2 (two) times daily.    . Multiple Vitamins-Minerals (ZINC PO) Take by mouth daily.    . nitroGLYCERIN (NITROSTAT) 0.4 MG SL tablet Place 0.4 mg under the tongue every 5 (five) minutes as needed for chest pain.    Marland Kitchen OZEMPIC, 1 MG/DOSE, 2 MG/1.5ML SOPN INJECT  1MG  INTO THE SKIN ONCE A WEEK 3 mL 2  . pantoprazole (PROTONIX) 40 MG tablet Take 1 tablet (40 mg total) by mouth daily. 90 tablet 3  . potassium chloride (KLOR-CON) 8 MEQ tablet TAKE TWO TABLETS TWICE A DAY ON THE DAYSYOU TAKE TORSEMIDE (Patient taking differently: Take 8 mEq by mouth daily. ON THE DAYS YOU TAKE TORSEMIDE) 180 tablet 3  . pyridostigmine (MESTINON) 60 MG tablet TAKE ONE-HALF TABLET BY MOUTH TWICE DAILY 30 tablet 6  . rosuvastatin (CRESTOR) 40 MG tablet TAKE ONE TABLET BY MOUTH EVERY DAY 90 tablet 0  . vitamin B-12 (CYANOCOBALAMIN) 500 MCG tablet Take 500 mcg by mouth daily.     No current facility-administered medications on file prior to visit.   Allergies  Allergen Reactions  . Demerol [Meperidine] Anaphylaxis    Tolerated Fentanyl 11/10/15  . Multaq [Dronedarone] Diarrhea  . Sulfa Antibiotics Anaphylaxis  . Tetracyclines & Related Other (See Comments) and Swelling    Made nose, lips,  And tongue itchy Made nose, lips,  And tongue itchy  . Epinephrine     Powder- SOB, palpitations   Family History  Problem Relation Age of Onset  . Thyroid  disease Mother 64       3 days after surgery  . Heart defect Father 93       ?ascending aortic aneurysm  . Breast cancer Sister   . Hepatitis Brother   . Colon cancer Maternal Grandmother   . Diabetes Maternal Grandmother   . Diabetes Maternal Grandfather   . Colon cancer Paternal Grandmother        84's  . Rectal cancer Son 49  . Breast cancer Half-Sister   . Diabetes Paternal Uncle   . Dementia Paternal Uncle   . Colon polyps Neg Hx   . Stomach cancer Neg Hx   . Liver cancer Neg Hx   . Pancreatic cancer Neg Hx   Also: -Diabetes in maternal grandparents -Hypertension and uncle-hyperlipidemia in the whole family -Heart disease in father, mother, uncle-thyroid problems in mother and sister-cancer in son, grandmother, half sister  PE: BP 120/78   Pulse 65   Ht 5\' 3"  (1.6 m)   Wt 178 lb (80.7 kg)   SpO2 99%   BMI  31.53 kg/m  Wt Readings from Last 3 Encounters:  07/29/19 178 lb (80.7 kg)  04/02/19 186 lb (84.4 kg)  03/21/19 185 lb (83.9 kg)   Constitutional: overweight, in NAD Eyes: PERRLA, EOMI, no exophthalmos ENT: moist mucous membranes, no thyromegaly, no cervical lymphadenopathy Cardiovascular: RRR, No MRG Respiratory: CTA B Gastrointestinal: abdomen soft, NT, ND, BS+ Musculoskeletal: no deformities, strength intact in all 4 Skin: moist, warm, no rashes Neurological: no tremor with outstretched hands, DTR normal in all 4  ASSESSMENT: 1. Postablative Hypothyroidism  2.  Insulin-dependent DM2  3.  History of hypocalcemia -History of hyperparathyroidism  4.  Vitamin D deficiency  PLAN:  1. Patient with longstanding, uncontrolled, possibly with hypothyroidism, with a long history of suppressed TSH, now with normal TFTs.  We did discuss in the past about possible side effect of over replacement with thyroid hormones to include cardiac arrhythmia (she has a history of atrial fibrillation), hypercoagulability and, in the long run, osteoporosis and increased mortality. - latest thyroid labs reviewed with pt >> normal: Lab Results  Component Value Date   TSH 0.71 05/06/2019   - she continues on LT4 112 mcg daily - pt feels good on this dose. - we discussed about taking the thyroid hormone every day, with water, >30 minutes before breakfast, separated by >4 hours from acid reflux medications, calcium, iron, multivitamins. Pt. is taking it correctly.  2. DM2 -insulin-dependent -Patient with longstanding, previously uncontrolled, type 2 diabetes, now with much improved control after starting to do intermittent fasting.  We stopped premixed insulin and started [place] before last visit.  She is doing very well on this regimen.  Also, she continues to lose weight, losing another 8 pounds since last visit. -at this visit, she is off Ozempic - did not refill it pending the appt today. Sugars at  home are almost all at goal >> for now, we discussed to stay off Ozempic and let me know if sugars start to increase. Patient Instructions  Please continue: - Tresiba 6 units daily in the fasting days - Tresiba 20 units daily in nonfasting days  Please continue levothyroxine 112 mcg daily.  Take the thyroid hormone every day, with water, at least 30 minutes before breakfast, separated by at least 4 hours from: - acid reflux medications - calcium - iron - multivitamins  Please return in 4 months with your sugar log.   - we checked her HbA1c: 6.1% (  improved) - advised to check sugars at different times of the day - 2x a day, rotating check times - advised for yearly eye exams >> she is UTD - return to clinic in 4 months   3. H/o Hypocalcemia -She has a history of hyperparathyroidism, now status post 3/4 parathyroidectomy -Latest calcium was reviewed and this was normal: Lab Results  Component Value Date   CALCIUM 8.9 10/21/2018   PHOS 4.1 05/03/2017  -She takes 1 Tums tablet with lunch and 1 later in the afternoon  4.  Vitamin D deficiency -Latest vitamin D level was normal in 03/2019 -Continues 1000 units vitamin D daily  Office Visit on 07/29/2019  Component Date Value Ref Range Status  . TSH 07/29/2019 1.27  0.35 - 4.50 uIU/mL Final  . Free T4 07/29/2019 1.08  0.60 - 1.60 ng/dL Final   Comment: Specimens from patients who are undergoing biotin therapy and /or ingesting biotin supplements may contain high levels of biotin.  The higher biotin concentration in these specimens interferes with this Free T4 assay.  Specimens that contain high levels  of biotin may cause false high results for this Free T4 assay.  Please interpret results in light of the total clinical presentation of the patient.    . Hemoglobin A1C 07/29/2019 6.1* 4.0 - 5.6 % Final   Normal TFTs.  Philemon Kingdom, MD PhD [facility]

## 2019-07-29 NOTE — Patient Instructions (Signed)
Please continue: - Tresiba 6-8 units daily in the fasting days - Tresiba 20 units daily in nonfasting days  Please continue levothyroxine 112 mcg daily.  Take the thyroid hormone every day, with water, at least 30 minutes before breakfast, separated by at least 4 hours from: - acid reflux medications - calcium - iron - multivitamins  Please return in 4 months with your sugar log.

## 2019-07-30 ENCOUNTER — Encounter: Payer: Self-pay | Admitting: Internal Medicine

## 2019-07-30 LAB — TSH: TSH: 1.27 u[IU]/mL (ref 0.35–4.50)

## 2019-07-30 LAB — T4, FREE: Free T4: 1.08 ng/dL (ref 0.60–1.60)

## 2019-07-31 ENCOUNTER — Telehealth: Payer: Self-pay

## 2019-07-31 NOTE — Telephone Encounter (Signed)
Returned call to patient. Discussed MyChart message from earlier. Explained that we are unsure why patient's MyChart view/access has changed and why she is unable to review reports. Explained concern has been escalated to Environmental education officer. Discussed PPM transmission results from 06/18/19 and reassured pt that PPM is functioning appropriately. Pt verbalizes understanding and appreciation of call back.

## 2019-07-31 NOTE — Telephone Encounter (Signed)
The pt would like for Raquel Sarna to give her a call back to answer her questions.

## 2019-08-10 ENCOUNTER — Encounter: Payer: Self-pay | Admitting: Internal Medicine

## 2019-08-10 ENCOUNTER — Other Ambulatory Visit: Payer: Self-pay | Admitting: Internal Medicine

## 2019-08-11 ENCOUNTER — Other Ambulatory Visit: Payer: Self-pay | Admitting: Internal Medicine

## 2019-08-21 ENCOUNTER — Other Ambulatory Visit: Payer: Self-pay | Admitting: Internal Medicine

## 2019-08-21 DIAGNOSIS — Z7901 Long term (current) use of anticoagulants: Secondary | ICD-10-CM

## 2019-08-21 DIAGNOSIS — I480 Paroxysmal atrial fibrillation: Secondary | ICD-10-CM

## 2019-08-21 MED ORDER — APIXABAN 5 MG PO TABS: 5.0000 mg | ORAL_TABLET | Freq: Two times a day (BID) | ORAL | 1 refills | Status: DC

## 2019-08-21 NOTE — Telephone Encounter (Signed)
*  STAT* If patient is at the pharmacy, call can be transferred to refill team.   1. Which medications need to be refilled? (please list name of each medication and dose if known) Eliquis 5mg  bid  2. Which pharmacy/location (including street and city if local pharmacy) is medication to be sent to? [facility] [place]  3. Do they need a 30 day or 90 day supply? 30  Patient will be out on Sunday

## 2019-08-21 NOTE — Telephone Encounter (Signed)
Pt's age 79, wt 80.7 kg, last labs in 10/2018 SCr 0.96, CrCl 61.53, last ov w/ SK 03/21/19. [facility] - pt will need labs prior to next refill to verify correct dosage.

## 2019-08-21 NOTE — Telephone Encounter (Signed)
Please review for refill. Thanks!  

## 2019-09-06 ENCOUNTER — Other Ambulatory Visit: Payer: Self-pay

## 2019-09-06 MED ORDER — OZEMPIC (1 MG/DOSE) 2 MG/1.5ML ~~LOC~~ SOPN: PEN_INJECTOR | SUBCUTANEOUS | 3 refills | Status: DC

## 2019-09-06 MED ORDER — TRESIBA FLEXTOUCH 200 UNIT/ML ~~LOC~~ SOPN: 6.0000 [IU] | PEN_INJECTOR | Freq: Every day | SUBCUTANEOUS | 2 refills | Status: DC

## 2019-09-06 MED ORDER — APIXABAN 5 MG PO TABS: 5.0000 mg | ORAL_TABLET | Freq: Two times a day (BID) | ORAL | 1 refills | Status: DC

## 2019-09-06 MED ORDER — PYRIDOSTIGMINE BROMIDE 60 MG PO TABS: 30.0000 mg | ORAL_TABLET | Freq: Two times a day (BID) | ORAL | 3 refills | Status: DC

## 2019-09-06 MED ORDER — INSULIN PEN NEEDLE 32G X 4 MM MISC: 3 refills | Status: DC

## 2019-09-06 MED ORDER — LEVOTHYROXINE SODIUM 112 MCG PO TABS: 112.0000 ug | ORAL_TABLET | Freq: Every day | ORAL | 3 refills | Status: DC

## 2019-09-06 NOTE — Telephone Encounter (Signed)
Last OV 03/21/19 Scr 0.96 on 10/21/18 79 years old 44kg

## 2019-09-06 NOTE — Telephone Encounter (Signed)
Please review for refill. Thank!  

## 2019-09-13 ENCOUNTER — Other Ambulatory Visit: Payer: Self-pay

## 2019-09-13 MED ORDER — ROSUVASTATIN CALCIUM 40 MG PO TABS: 40.0000 mg | ORAL_TABLET | Freq: Every day | ORAL | 0 refills | Status: DC

## 2019-09-17 ENCOUNTER — Encounter: Payer: Self-pay | Admitting: Internal Medicine

## 2019-09-17 ENCOUNTER — Ambulatory Visit (INDEPENDENT_AMBULATORY_CARE_PROVIDER_SITE_OTHER): Payer: Medicare Other | Admitting: *Deleted

## 2019-09-17 ENCOUNTER — Other Ambulatory Visit: Payer: Self-pay

## 2019-09-17 ENCOUNTER — Ambulatory Visit (INDEPENDENT_AMBULATORY_CARE_PROVIDER_SITE_OTHER): Payer: Medicare Other | Admitting: Internal Medicine

## 2019-09-17 VITALS — BP 120/82 | HR 55 | Ht 62.0 in | Wt 179.2 lb

## 2019-09-17 DIAGNOSIS — Z79899 Other long term (current) drug therapy: Secondary | ICD-10-CM

## 2019-09-17 DIAGNOSIS — I4819 Other persistent atrial fibrillation: Secondary | ICD-10-CM | POA: Diagnosis not present

## 2019-09-17 DIAGNOSIS — R079 Chest pain, unspecified: Secondary | ICD-10-CM

## 2019-09-17 DIAGNOSIS — I495 Sick sinus syndrome: Secondary | ICD-10-CM | POA: Diagnosis not present

## 2019-09-17 DIAGNOSIS — I2510 Atherosclerotic heart disease of native coronary artery without angina pectoris: Secondary | ICD-10-CM | POA: Diagnosis not present

## 2019-09-17 DIAGNOSIS — I5032 Chronic diastolic (congestive) heart failure: Secondary | ICD-10-CM | POA: Diagnosis not present

## 2019-09-17 DIAGNOSIS — Z950 Presence of cardiac pacemaker: Secondary | ICD-10-CM | POA: Diagnosis not present

## 2019-09-17 LAB — CUP PACEART REMOTE DEVICE CHECK
Battery Remaining Longevity: 128 mo
Battery Remaining Percentage: 95.5 %
Battery Voltage: 3.01 V
Brady Statistic AP VP Percent: 21 %
Brady Statistic AP VS Percent: 13 %
Brady Statistic AS VP Percent: 4.1 %
Brady Statistic AS VS Percent: 62 %
Brady Statistic RA Percent Paced: 32 %
Brady Statistic RV Percent Paced: 25 %
Date Time Interrogation Session: 20210706030249
Implantable Lead Implant Date: 20170829
Implantable Lead Implant Date: 20170829
Implantable Lead Location: 753859
Implantable Lead Location: 753860
Implantable Pulse Generator Implant Date: 20170829
Lead Channel Impedance Value: 380 Ohm
Lead Channel Impedance Value: 550 Ohm
Lead Channel Pacing Threshold Amplitude: 0.75 V
Lead Channel Pacing Threshold Amplitude: 0.75 V
Lead Channel Pacing Threshold Pulse Width: 0.4 ms
Lead Channel Pacing Threshold Pulse Width: 0.4 ms
Lead Channel Sensing Intrinsic Amplitude: 12 mV
Lead Channel Sensing Intrinsic Amplitude: 4.3 mV
Lead Channel Setting Pacing Amplitude: 1.75 V
Lead Channel Setting Pacing Amplitude: 2.5 V
Lead Channel Setting Pacing Pulse Width: 0.4 ms
Lead Channel Setting Sensing Sensitivity: 2 mV
Pulse Gen Model: 2272
Pulse Gen Serial Number: 7945290

## 2019-09-17 MED ORDER — TRESIBA FLEXTOUCH 200 UNIT/ML ~~LOC~~ SOPN: 6.0000 [IU] | PEN_INJECTOR | Freq: Every day | SUBCUTANEOUS | 2 refills | Status: DC

## 2019-09-17 NOTE — Patient Instructions (Addendum)
Medication Instructions:  - Your physician recommends that you continue on your current medications as directed. Please refer to the Current Medication list given to you today.  *If you need a refill on your cardiac medications before your next appointment, please call your pharmacy*   Lab Work: - Your physician recommends that you have lab work today: BMP/ CBC/ Lipid/ Direct LDL  If you have labs (blood work) drawn today and your tests are completely normal, you will receive your results only by: Marland Kitchen MyChart Message (if you have MyChart) OR . A paper copy in the mail If you have any lab test that is abnormal or we need to change your treatment, we will call you to review the results.   Testing/Procedures: - Your physician has requested that you have a lexiscan myoview.  [place]  Your caregiver has ordered a Stress Test with nuclear imaging. The purpose of this test is to evaluate the blood supply to your heart muscle. This procedure is referred to as a "Non-Invasive Stress Test." This is because other than having an IV started in your vein, nothing is inserted or "invades" your body. Cardiac stress tests are done to find areas of poor blood flow to the heart by determining the extent of coronary artery disease (CAD). Some patients exercise on a treadmill, which naturally increases the blood flow to your heart, while others who are  unable to walk on a treadmill due to physical limitations have a pharmacologic/chemical stress agent called Lexiscan . This medicine will mimic walking on a treadmill by temporarily increasing your coronary blood flow.   Please note: these test may take anywhere between 2-4 hours to complete  PLEASE REPORT TO [place] AT THE FIRST DESK WILL DIRECT YOU WHERE TO GO  Date of Procedure:_____________________________________  Arrival Time for Procedure:______________________________  Instructions regarding medication:    _x___ : Hold diabetes medication/ Insulin the morning of procedure   PLEASE NOTIFY THE OFFICE AT LEAST 24 HOURS IN ADVANCE IF YOU ARE UNABLE TO KEEP YOUR APPOINTMENT.  [phone] AND  PLEASE NOTIFY NUCLEAR MEDICINE AT [facility] AT LEAST 24 HOURS IN ADVANCE IF YOU ARE UNABLE TO KEEP YOUR APPOINTMENT. [phone]  How to prepare for your Myoview test:  1. Do not eat or drink after midnight 2. No caffeine for 24 hours prior to test 3. No smoking 24 hours prior to test. 4. Your medication may be taken with water.  If your doctor stopped a medication because of this test, do not take that medication. 5. Ladies, please do not wear dresses.  Skirts or pants are appropriate. Please wear a short sleeve shirt. 6. No perfume, cologne or lotion. 7. Wear comfortable walking shoes. No heels!      Follow-Up: At [facility], you and your health needs are our priority.  As part of our continuing mission to provide you with exceptional heart care, we have created designated Provider Care Teams.  These Care Teams include your primary Cardiologist (physician) and Advanced Practice Providers (APPs -  Physician Assistants and Nurse Practitioners) who all work together to provide you with the care you need, when you need it.  We recommend signing up for the patient portal called "MyChart".  Sign up information is provided on this After Visit Summary.  MyChart is used to connect with patients for Virtual Visits (Telemedicine).  Patients are able to view lab/test results, encounter notes, upcoming appointments, etc.  Non-urgent messages can be sent to your  provider as well.   To learn more about what you can do with MyChart, go to [email or web address].    Your next appointment:   6 month(s)  The format for your next appointment:   In Person  Provider:   Virl Axe, MD   Other Instructions n/a   Cardiac Nuclear Scan A cardiac nuclear scan is a test that measures blood flow to the heart when a  person is resting and when he or she is exercising. The test looks for problems such as:  Not enough blood reaching a portion of the heart.  The heart muscle not working normally. You may need this test if:  You have heart disease.  You have had abnormal lab results.  You have had heart surgery or a balloon procedure to open up blocked arteries (angioplasty).  You have chest pain.  You have shortness of breath. In this test, a radioactive dye (tracer) is injected into your bloodstream. After the tracer has traveled to your heart, an imaging device is used to measure how much of the tracer is absorbed by or distributed to various areas of your heart. This procedure is usually done at a hospital and takes 2-4 hours. Tell a health care provider about:  Any allergies you have.  All medicines you are taking, including vitamins, herbs, eye drops, creams, and over-the-counter medicines.  Any problems you or family members have had with anesthetic medicines.  Any blood disorders you have.  Any surgeries you have had.  Any medical conditions you have.  Whether you are pregnant or may be pregnant. What are the risks? Generally, this is a safe procedure. However, problems may occur, including:  Serious chest pain and heart attack. This is only a risk if the stress portion of the test is done.  Rapid heartbeat.  Sensation of warmth in your chest. This usually passes quickly.  Allergic reaction to the tracer. What happens before the procedure?  Ask your health care provider about changing or stopping your regular medicines. This is especially important if you are taking diabetes medicines or blood thinners.  Follow instructions from your health care provider about eating or drinking restrictions.  Remove your jewelry on the day of the procedure. What happens during the procedure?  An IV will be inserted into one of your veins.  Your health care provider will inject a small  amount of radioactive tracer through the IV.  You will wait for 20-40 minutes while the tracer travels through your bloodstream.  Your heart activity will be monitored with an electrocardiogram (ECG).  You will lie down on an exam table.  Images of your heart will be taken for about 15-20 minutes.  You may also have a stress test. For this test, one of the following may be done: ? You will exercise on a treadmill or stationary bike. While you exercise, your heart's activity will be monitored with an ECG, and your blood pressure will be checked. ? You will be given medicines that will increase blood flow to parts of your heart. This is done if you are unable to exercise.  When blood flow to your heart has peaked, a tracer will again be injected through the IV.  After 20-40 minutes, you will get back on the exam table and have more images taken of your heart.  Depending on the type of tracer used, scans may need to be repeated 3-4 hours later.  Your IV line will be removed when the procedure is over.  The procedure may vary among health care providers and hospitals. What happens after the procedure?  Unless your health care provider tells you otherwise, you may return to your normal schedule, including diet, activities, and medicines.  Unless your health care provider tells you otherwise, you may increase your fluid intake. This will help to flush the contrast dye from your body. Drink enough fluid to keep your urine pale yellow.  Ask your health care provider, or the department that is doing the test: ? When will my results be ready? ? How will I get my results? Summary  A cardiac nuclear scan measures the blood flow to the heart when a person is resting and when he or she is exercising.  Tell your health care provider if you are pregnant.  Before the procedure, ask your health care provider about changing or stopping your regular medicines. This is especially important if you are  taking diabetes medicines or blood thinners.  After the procedure, unless your health care provider tells you otherwise, increase your fluid intake. This will help flush the contrast dye from your body.  After the procedure, unless your health care provider tells you otherwise, you may return to your normal schedule, including diet, activities, and medicines. This information is not intended to replace advice given to you by your health care provider. Make sure you discuss any questions you have with your health care provider. Document Revised: 08/14/2017 Document Reviewed: 08/14/2017 Elsevier Patient Education  [place].

## 2019-09-17 NOTE — Progress Notes (Signed)
ELECTROPHYSIOLOGY OFFICE  NOTE  Patient ID: Dawn Garcia, MRN: [id number], DOB/AGE: 79/11/1940 79 y.o. Admit date: (Not on file) Date of Consult: 09/17/2019  Primary Physician: Perrin Maltese, MD Primary Cardiologist: tg       HPI Dawn Garcia is a 79 y.o. female  Seen in followup for pacing by Dr. 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 This was complicated by right atrial lead dislodgment requiring revision.  She has recurrent atrial fib; LH and orthostatic tachycardia assoc with Orthostatic hypotension treated with midodrine reduction of her diuretics and adjunctive mestinon   She has a history of a  PVI 2013 and repeat7/19.   Some chest pains, lasting minutes to hours, often assoc with irregular pulse on pulse  Significant SOB DOE  Minimal LH  No edema   DATE TEST    6/17    Echo   EF 65 %   11/17    Myoview   EF 54 % Anteroapical defect  8/18 Echo  EF 50-55%  possible wall motion abnormality-reviewed by TG no further workup necessary  2/19 Cath    moderate mid-to distal LAD with patent circumflex stent//LVEDP 8//RA 11-notably patient was in sinus rhythm  2/19` Echo   EF 50-55%       Date Cr K TSH Hgb  12/17      12/18 1.19 3.9     1/19 1.69 3.1  16.4  2/19 1.69 4.3    7/19 1.36 3.9  12.6  1/20   0.34   8/20 0.9 3.5    5/21   1.27        Thromboembolic risk factors ( age  -2, DM-1, Vasc disease -1, CHF-1, Gender-1) for a CHADSVASc Score of 6      Past Medical History:  Diagnosis Date  . Age-related macular degeneration, dry, right eye   . Age-related macular degeneration, wet, left eye ([place])   . Arthritis    "all over; particularly in hands/fingers; all my joints" (10/05/2017)  . Atrial fibrillation ([place])   . CAD (coronary artery disease)   . CHF (congestive heart failure) (HCC)     A-Fib  . Chronic kidney disease   . COPD (chronic obstructive pulmonary disease) ([place])    "very mild" (10/05/2017)  . Family history of adverse reaction  to anesthesia    "daughter w/PONV & woke up during endoscopy" (10/05/2017)  . GERD (gastroesophageal reflux disease)   . Gout   . Graves' disease    S/P "radioactive tx"  . Heart disease   . History of blood transfusion    "related to shoulder replacement"   . History of gout    "no longer on daily RX" (10/05/2017)  . History of hiatal hernia   . Hyperlipidemia   . Hypertension   . Hypocalcemia   . Hypokalemia   . Hypothyroidism   . Macular degeneration   . Melanoma ([place]) ~ 2012   "off my back" (10/05/2017)  . Myocardial infarction [facility])    "was told I'd had one; don't really know when" (10/05/2017)  . Orthostatic hypotension 2020  . OSA on CPAP   . Pacemaker   . 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 Renal disorder   . 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 Sinus node dysfunction (HCC)   . Type II diabetes mellitus ([place])   . Vitamin B12 deficiency   . Vitamin D deficiency  Surgical History:  Past Surgical History:  Procedure Laterality Date  . APPENDECTOMY    . ATRIAL FIBRILLATION ABLATION N/A 10/05/2017   Procedure: ATRIAL FIBRILLATION ABLATION;  Surgeon: Constance Haw, MD;  Location: [facility];  Service: Cardiovascular;  Laterality: N/A;  . ATRIAL FIBRILLATION ABLATION  ~ 2014  . CARDIAC CATHETERIZATION  04/2017  . COLONOSCOPY W/ BIOPSIES AND POLYPECTOMY  2015   1 polyp removed- repeat 3 years  . CORONARY ANGIOPLASTY WITH STENT PLACEMENT  ~ 2014  . EP IMPLANTABLE DEVICE N/A 11/10/2015   Procedure: Pacemaker Implant;  Surgeon: Will [name], MD;  Location: [place] [facility];  Service: Cardiovascular;  Laterality: N/A;  . EP IMPLANTABLE DEVICE N/A 11/11/2015   Procedure: Lead Revision/Repair;  Surgeon: Evans Lance, MD;  Location: [facility];  Service: Cardiovascular;  Laterality: N/A;  . INGUINAL HERNIA REPAIR Left 1951  . JOINT REPLACEMENT    . LAPAROSCOPIC CHOLECYSTECTOMY    . MELANOMA EXCISION   ~ 2012   "off my back" (10/05/2017)  . PARATHYROIDECTOMY     "removed 3 glands; still have 1 gland" (10/05/2017)  . RIGHT/LEFT HEART CATH AND CORONARY ANGIOGRAPHY N/A 05/03/2017   Procedure: RIGHT/LEFT HEART CATH AND CORONARY ANGIOGRAPHY;  Surgeon: Leonie Man, MD;  Location: [facility];  Service: Cardiovascular;  Laterality: N/A;  . TONSILLECTOMY    . TOTAL SHOULDER ARTHROPLASTY Right x 2  . VAGINAL HYSTERECTOMY     "total"     Home Meds:  Current Meds  Medication Sig  . apixaban (ELIQUIS) 5 MG TABS tablet Take 1 tablet (5 mg total) by mouth 2 (two) times daily.  . Ascorbic Acid (VITAMIN C PO) Take by mouth daily.  . carboxymethylcellulose (REFRESH PLUS) 0.5 % SOLN Place 1 drop into both eyes 3 (three) times daily as needed.  . Cholecalciferol (VITAMIN D3) 5000 units TABS Take 1 tablet by mouth every morning.  . insulin degludec (TRESIBA FLEXTOUCH) 200 UNIT/ML FlexTouch Pen Inject 6-20 Units into the skin daily.  . Insulin Syringe-Needle U-100 (INSULIN SYRINGE .3CC/31GX5/16") 31G X 5/16" 0.3 ML MISC Use to inject insulin twice daily  . levothyroxine (SYNTHROID) 112 MCG tablet Take 1 tablet (112 mcg total) by mouth daily.  . Multiple Vitamins-Minerals (ICAPS AREDS 2) CAPS Take 1 capsule by mouth 2 (two) times daily.  . nitroGLYCERIN (NITROSTAT) 0.4 MG SL tablet Place 0.4 mg under the tongue every 5 (five) minutes as needed for chest pain.  . pantoprazole (PROTONIX) 40 MG tablet Take 1 tablet (40 mg total) by mouth daily.  Marland Kitchen pyridostigmine (MESTINON) 60 MG tablet Take 0.5 tablets (30 mg total) by mouth 2 (two) times daily.  . Semaglutide, 1 MG/DOSE, (OZEMPIC, 1 MG/DOSE,) 2 MG/1.5ML SOPN INJECT 1 MG INTO THE SKIN ONCE WEEKLY  . vitamin B-12 (CYANOCOBALAMIN) 500 MCG tablet Take 500 mcg by mouth daily.      Allergies:  Allergies  Allergen Reactions  . Demerol [Meperidine] Anaphylaxis    Tolerated Fentanyl 11/10/15  . Multaq [Dronedarone] Diarrhea  . Sulfa Antibiotics  Anaphylaxis  . Tetracyclines & Related Other (See Comments) and Swelling    Made nose, lips,  And tongue itchy Made nose, lips,  And tongue itchy  . Epinephrine     Powder- SOB, palpitations    Social History   Socioeconomic History  . Marital status: Married    Spouse name: Barbarann Ehlers  . Number of children: 3  . Years of education: Not on file  . Highest  education level: Master's degree (e.g., MA, MS, MEng, MEd, MSW, MBA)  Occupational History  . Occupation: Retired    Comment: Therapist, sports  Tobacco Use  . Smoking status: Former Smoker    Packs/day: 2.00    Years: 3.00    Pack years: 6.00    Types: Cigarettes    Quit date: 09/25/1968    Years since quitting: 51.0  . Smokeless tobacco: Never Used  . Tobacco comment: 10/05/2017 "smoked in my 20's"  Vaping Use  . Vaping Use: Never used  Substance and Sexual Activity  . Alcohol use: Not Currently    Comment: 10/05/2017 "1 mixed drink q 2-3 months"  . Drug use: Never  . Sexual activity: Not on file  Other Topics Concern  . Not on file  Social History Narrative   Moved from [facility] - originally from [place] - daughter lives in [place]   03/20/19 Lives in [place] w/ husband - he is demented- fairly functional   Retired Therapist, sports, Scientist, physiological also - variety of experiences   2 sons 1 daughter   Social Determinants of Radio broadcast assistant Strain:   . Difficulty of Paying Living Expenses:   Food Insecurity:   . Worried About Charity fundraiser in the Last Year:   . Arboriculturist in the Last Year:   Transportation Needs:   . Film/video editor (Medical):   Marland Kitchen Lack of Transportation (Non-Medical):   Physical Activity:   . Days of Exercise per Week:   . Minutes of Exercise per Session:   Stress:   . Feeling of Stress :   Social Connections:   . Frequency of Communication with Friends and Family:   . Frequency of Social Gatherings with Friends and Family:   . Attends Religious Services:   . Active Member of Clubs or Organizations:     . Attends Archivist Meetings:   Marland Kitchen Marital Status:   Intimate Partner Violence:   . Fear of Current or Ex-Partner:   . Emotionally Abused:   Marland Kitchen Physically Abused:   . Sexually Abused:      Family History  Problem Relation Age of Onset  . Thyroid disease Mother 68       3 days after surgery  . Heart defect Father 30       ?ascending aortic aneurysm  . Breast cancer Sister   . Hepatitis Brother   . Colon cancer Maternal Grandmother   . Diabetes Maternal Grandmother   . Diabetes Maternal Grandfather   . Colon cancer Paternal Grandmother        79's  . Rectal cancer Son 9  . Breast cancer Half-Sister   . Diabetes Paternal Uncle   . Dementia Paternal Uncle   . Colon polyps Neg Hx   . Stomach cancer Neg Hx   . Liver cancer Neg Hx   . Pancreatic cancer Neg Hx      ROS:  Please see the history of present illness.     All other systems reviewed and negative.  BP 120/82 (BP Location: Left Arm, Patient Position: Sitting, Cuff Size: Normal)   Pulse (!) 55   Ht 5\' 2"  (1.575 m)   Wt 179 lb 4 oz (81.3 kg)   SpO2 98%   BMI 32.79 kg/m  Well developed and well nourished in no acute distress HENT normal Neck supple with JVP-flat Clear Device pocket well healed; without hematoma or erythema.  There is no tethering  Regular rate and rhythm,  no murmur Abd-soft with active BS No Clubbing cyanosis   edema Skin-warm and dry A & Oriented  Grossly normal sensory and motor function  ECG a pacing 55 31/[birth year]51 TW inversionsV3-6  New    Labs: Cardiac Enzymes No results for input(s): CKTOTAL, CKMB, TROPONINI in the last 72 hours. CBC Lab Results  Component Value Date   WBC 6.4 10/21/2018   HGB 13.3 10/21/2018   HCT 39.0 10/21/2018   MCV 83.8 10/21/2018   PLT 183 10/21/2018   PROTIME: No results for input(s): LABPROT, INR in the last 72 hours. Chemistry  No results for input(s): NA, K, CL, CO2, BUN, CREATININE, CALCIUM, PROT, BILITOT, ALKPHOS, ALT, AST, GLUCOSE in  the last 168 hours.  Invalid input(s): LABALBU Lipids Lab Results  Component Value Date   CHOL 127 04/19/2016   HDL 42 04/19/2016   LDLCALC 43 04/19/2016   TRIG 209 (H) 04/19/2016   BNP No results found for: PROBNP Thyroid Function Tests: No results for input(s): TSH, T4TOTAL, T3FREE, THYROIDAB in the last 72 hours.  Invalid input(s): FREET3 Miscellaneous Lab Results  Component Value Date   DDIMER <0.27 04/28/2017    Radiology/Studies:  No results found.     Assessment and Plan:  Sick Sinus syndrome  Persistent Atrial Fib with prior PVI and redo 7/19//Atrial tach  Pacemaker St Jude   OSA  P-wave oversensing resulting false detection of atrial fibrillation  HFpEF   chronic  DOE  Coronary artery disease with prior stenting  Chest pain   Orthostatic intolerance    Continues with some Afib/tach burden aobut 6%   Chest pain is very atypical but her changed ECG is concerning for ischemia, although T wave memory would be my first Impression  Will get myoview scan  Stopped rosuvastatin 2/2 memory issues  She thinks  The got better will check LDL  OI largely controlled   Dyspnea on exertion could be anginal equivalent, she also has chronotropic incompetence with demonstrable decrease in HR >70 from 35% ( 1/20) >>15%   Activated rate response  Scant fluid using diuretics only prn        Virl Axe

## 2019-09-18 LAB — CBC WITH DIFFERENTIAL/PLATELET
Basophils Absolute: 0.1 10*3/uL (ref 0.0–0.2)
Basos: 1 %
EOS (ABSOLUTE): 0.2 10*3/uL (ref 0.0–0.4)
Eos: 5 %
Hematocrit: 41.8 % (ref 34.0–46.6)
Hemoglobin: 13.4 g/dL (ref 11.1–15.9)
Immature Grans (Abs): 0 10*3/uL (ref 0.0–0.1)
Immature Granulocytes: 0 %
Lymphocytes Absolute: 1.5 10*3/uL (ref 0.7–3.1)
Lymphs: 32 %
MCH: 28.1 pg (ref 26.6–33.0)
MCHC: 32.1 g/dL (ref 31.5–35.7)
MCV: 88 fL (ref 79–97)
Monocytes Absolute: 0.4 10*3/uL (ref 0.1–0.9)
Monocytes: 10 %
Neutrophils Absolute: 2.4 10*3/uL (ref 1.4–7.0)
Neutrophils: 52 %
Platelets: 205 10*3/uL (ref 150–450)
RBC: 4.77 x10E6/uL (ref 3.77–5.28)
RDW: 15.7 % — ABNORMAL HIGH (ref 11.7–15.4)
WBC: 4.6 10*3/uL (ref 3.4–10.8)

## 2019-09-18 LAB — BASIC METABOLIC PANEL
BUN/Creatinine Ratio: 19 (ref 12–28)
BUN: 19 mg/dL (ref 8–27)
CO2: 20 mmol/L (ref 20–29)
Calcium: 9.1 mg/dL (ref 8.7–10.3)
Chloride: 107 mmol/L — ABNORMAL HIGH (ref 96–106)
Creatinine, Ser: 1.01 mg/dL — ABNORMAL HIGH (ref 0.57–1.00)
GFR calc Af Amer: 62 mL/min/{1.73_m2} (ref >59)
GFR calc non Af Amer: 53 mL/min/{1.73_m2} — ABNORMAL LOW (ref >59)
Glucose: 115 mg/dL — ABNORMAL HIGH (ref 65–99)
Potassium: 4.1 mmol/L (ref 3.5–5.2)
Sodium: 142 mmol/L (ref 134–144)

## 2019-09-18 NOTE — Progress Notes (Signed)
Remote pacemaker transmission.   

## 2019-09-19 ENCOUNTER — Telehealth: Payer: Self-pay | Admitting: Internal Medicine

## 2019-09-19 LAB — LDL CHOLESTEROL, DIRECT: LDL Direct: 63 mg/dL (ref 0–99)

## 2019-09-19 LAB — LIPID PANEL
Chol/HDL Ratio: 7.5 ratio — ABNORMAL HIGH (ref 0.0–4.4)
Cholesterol, Total: 224 mg/dL — ABNORMAL HIGH (ref 100–199)
HDL: 30 mg/dL — ABNORMAL LOW (ref >39)
LDL Chol Calc (NIH): 119 mg/dL — ABNORMAL HIGH (ref 0–99)
Triglycerides: 426 mg/dL — ABNORMAL HIGH (ref 0–149)
VLDL Cholesterol Cal: 75 mg/dL — ABNORMAL HIGH (ref 5–40)

## 2019-09-19 NOTE — Telephone Encounter (Signed)
I spoke with the patient. She states that her PCP was placing her on fenofibrate and she just wanted Dr. Caryl Comes to know. She is waiting on the RX to come her mail order, so she isn't sure of the dose.   I advised I would forward to Dr. Caryl Comes as an Juluis Rainier.

## 2019-09-19 NOTE — Telephone Encounter (Signed)
Patient wants Dr Caryl Comes to be aware she is being treated  With medication by pcp for high triglycerides

## 2019-09-19 NOTE — Telephone Encounter (Signed)
I attempted to call the patient to confirm what drug she was placed on. No answer- I left a message to please call back.

## 2019-09-23 ENCOUNTER — Ambulatory Visit
Admission: RE | Admit: 2019-09-23 | Discharge: 2019-09-23 | Disposition: A | Discharge status: Home / Self Care | Payer: Medicare Other | Source: Ambulatory Visit | Attending: Internal Medicine | Admitting: Internal Medicine

## 2019-09-23 ENCOUNTER — Other Ambulatory Visit: Payer: Self-pay

## 2019-09-23 DIAGNOSIS — R079 Chest pain, unspecified: Secondary | ICD-10-CM | POA: Diagnosis not present

## 2019-09-23 LAB — NM MYOCAR MULTI W/SPECT W/WALL MOTION / EF
LV dias vol: 108 mL (ref 46–106)
LV sys vol: 49 mL
Peak HR: 86 {beats}/min
Percent HR: 60 %
Rest HR: 55 {beats}/min
SDS: 9
SRS: 3
SSS: 12
TID: 1.07

## 2019-09-23 MED ORDER — TECHNETIUM TC 99M TETROFOSMIN IV KIT
10.0000 | PACK | Freq: Once | INTRAVENOUS | Status: AC | PRN
  Administered 2019-09-23: 10.59 via INTRAVENOUS

## 2019-09-23 MED ORDER — TECHNETIUM TC 99M TETROFOSMIN IV KIT
29.3200 | PACK | Freq: Once | INTRAVENOUS | Status: AC | PRN
  Administered 2019-09-23: 29.32 via INTRAVENOUS

## 2019-09-23 MED ORDER — REGADENOSON 0.4 MG/5ML IV SOLN
0.4000 mg | Freq: Once | INTRAVENOUS | Status: AC
  Administered 2019-09-23: 0.4 mg via INTRAVENOUS

## 2019-09-25 ENCOUNTER — Other Ambulatory Visit: Payer: Self-pay

## 2019-09-25 ENCOUNTER — Telehealth: Payer: Self-pay

## 2019-09-25 MED ORDER — ACCU-CHEK AVIVA PLUS VI STRP: ORAL_STRIP | 12 refills | Status: DC

## 2019-09-25 NOTE — Telephone Encounter (Signed)
-----   Message from Deboraha Sprang, MD sent at 09/24/2019  9:18 PM EDT ----- Please Inform Patient that LDL is 119   is abnormal and will require a statin Begin crestor 10 mg daily  Thanks

## 2019-09-25 NOTE — Telephone Encounter (Signed)
Call to patient to make her aware of results.   She reported that she was seen by PCP who decided to start crestor 20 mg daily.   I reviewed stress test results as well.   She had a few questions about the stress test results that she has forwarded to Dr. Caryl Comes.  This encounter completed at this time. No new orders.

## 2019-09-25 NOTE — Telephone Encounter (Signed)
-----   Message from Deboraha Sprang, MD sent at 09/24/2019  9:08 PM EDT ----- Please Inform Patient that stress test showed no evidence of ischemia, stable#and near normal# heart muscle function  Good news   Thanks

## 2019-09-25 NOTE — Telephone Encounter (Signed)
Refill request fax received from [facility].

## 2019-10-08 ENCOUNTER — Other Ambulatory Visit: Payer: Self-pay | Admitting: Internal Medicine

## 2019-11-26 ENCOUNTER — Other Ambulatory Visit: Payer: Self-pay

## 2019-11-26 MED ORDER — PYRIDOSTIGMINE BROMIDE 60 MG PO TABS: ORAL_TABLET | ORAL | 0 refills | Status: DC

## 2019-11-26 NOTE — Telephone Encounter (Signed)
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 Thanks

## 2019-11-29 ENCOUNTER — Ambulatory Visit (INDEPENDENT_AMBULATORY_CARE_PROVIDER_SITE_OTHER): Payer: Medicare Other | Admitting: Internal Medicine

## 2019-11-29 ENCOUNTER — Encounter: Payer: Self-pay | Admitting: Internal Medicine

## 2019-11-29 ENCOUNTER — Other Ambulatory Visit: Payer: Self-pay

## 2019-11-29 VITALS — BP 142/86 | HR 58 | Ht 63.0 in | Wt 176.0 lb

## 2019-11-29 DIAGNOSIS — E892 Postprocedural hypoparathyroidism: Secondary | ICD-10-CM

## 2019-11-29 DIAGNOSIS — E890 Postprocedural hypothyroidism: Secondary | ICD-10-CM | POA: Diagnosis not present

## 2019-11-29 DIAGNOSIS — Z9009 Acquired absence of other part of head and neck: Secondary | ICD-10-CM

## 2019-11-29 DIAGNOSIS — E1165 Type 2 diabetes mellitus with hyperglycemia: Secondary | ICD-10-CM

## 2019-11-29 DIAGNOSIS — I2510 Atherosclerotic heart disease of native coronary artery without angina pectoris: Secondary | ICD-10-CM

## 2019-11-29 DIAGNOSIS — E1159 Type 2 diabetes mellitus with other circulatory complications: Secondary | ICD-10-CM | POA: Diagnosis not present

## 2019-11-29 LAB — POCT GLYCOSYLATED HEMOGLOBIN (HGB A1C): Hemoglobin A1C: 5.9 % — AB (ref 4.0–5.6)

## 2019-11-29 MED ORDER — LEVOTHYROXINE SODIUM 112 MCG PO TABS: 112.0000 ug | ORAL_TABLET | Freq: Every day | ORAL | 3 refills | Status: DC

## 2019-11-29 MED ORDER — TRESIBA FLEXTOUCH 200 UNIT/ML ~~LOC~~ SOPN: 8.0000 [IU] | PEN_INJECTOR | Freq: Every day | SUBCUTANEOUS | 2 refills | Status: DC

## 2019-11-29 NOTE — Progress Notes (Signed)
Patient ID: Dawn Garcia [facility], female   DOB: [birth year], 79 y.o.   MRN: [id number]   This visit occurred during the SARS-CoV-2 public health emergency.  Safety protocols were in place, including screening questions prior to the visit, additional usage of staff PPE, and extensive cleaning of exam room while observing appropriate contact time as indicated for disinfecting solutions.   HPI  Dawn Garcia is a 79 y.o.-year-old female, initially referred by her PCP, Dr. Humphrey Rolls, presenting for follow-up for uncontrolled post ablative hypothyroidism and DM2, insulin-dependent since 2013, with long-term complications (atrial fibrillation, CHF, CKD).  Last visit 4 mo ago.  She continues intermittent fasting, started 04/2018 -currently doing 16-8 windows.  She lost approximately 40 pounds total.  She reached a plateau.  Post ablative hypothyroidism: Pt. has been dx with Graves ds. At 79 y/o. She had RAI Tx in 1960 >> postablative hypothyroidism  >> started on levothyroxine.  Patient had consistently suppressed TSH levels over the years so we are not decreasing her doses very slowly due to fatigue.  We decreased her levothyroxine dose in 03/2019.  Pt is on levothyroxine 112 mcg daily, taken: - in am - fasting - at least 30 min from b'fast - no Ca, Fe, MVI, + PPIs and calcium in the p.m. - on Biotin in B complex  She was on amiodarone in 03/2017, but stopped in 04/2017 due to AKI.  Reviewed her TFTs: Lab Results  Component Value Date   TSH 1.27 07/29/2019   TSH 0.71 05/06/2019   TSH 0.20 (L) 03/20/2019   TSH 0.05 (L) 12/27/2018   TSH 0.03 (L) 10/02/2018   TSH 0.34 (L) 03/27/2018   TSH 0.04 (L) 02/05/2018   TSH 0.198 (L) 04/26/2017   TSH 0.174 (L) 04/18/2017   TSH 0.034 (L) 03/10/2016   FREET4 1.08 07/29/2019   FREET4 1.13 05/06/2019   FREET4 1.25 03/20/2019   FREET4 1.70 (H) 12/27/2018   FREET4 1.35 10/02/2018   FREET4 1.55 03/27/2018   FREET4 1.40 02/05/2018   FREET4 3.54 (H)  04/26/2017   FREET4 2.08 (H) 11/08/2015   FREET4 2.18 (H) 09/09/2015   T3FREE 3.4 02/05/2018   T3FREE 1.8 (L) 04/26/2017   T3FREE 3.0 11/08/2015   T3FREE 2.8 09/09/2015   T3FREE 3.1 08/19/2015  10/25/2017: TSH 0.021 (0.45-5.33), free T4 1.7 (0.66-1.14)  Her Graves' antibodies were not elevated: Lab Results  Component Value Date   TSI 102 02/05/2018    No results found for: THGAB No components found for: TPOAB  Pt denies: - feeling nodules in neck - hoarseness - dysphagia - choking - SOB with lying down  She has + FH of thyroid disorders in: mother (died after sx for toxic goiter) and sister (goiter).  No FH of thyroid cancer. No h/o radiation tx to head or neck other than RAI treatment.  No herbal supplements. No Biotin use. No recent steroids use.   She has a history of Graves' ophthalmopathy>> had increased IO pressure and did not respond to IV steroids >> she had radiation therapy and then eyelid surgery.  DM2: -Insulin-dependent since 2013.  Reviewed HbA1c levels: Lab Results  Component Value Date   HGBA1C 6.1 (A) 07/29/2019   HGBA1C 7.1 (A) 04/02/2019   HGBA1C 6.5 (A) 12/27/2018   HGBA1C 6.8 (A) 10/02/2018   HGBA1C 8.5 (A) 06/27/2018   HGBA1C 8.4 (A) 02/05/2018   HGBA1C 9.4 (H) 04/28/2017   HGBA1C 7.8 (H) 11/29/2016   HGBA1C 8.1 (H) 09/05/2016   HGBA1C 8.2 (H) 05/02/2016  08/14/2018: HbA1c 8.0%  10/25/2017: HbA1c 8.4%  Previously on: - Novolin 70/30 -not taken in the day she is fasting: - 20 >> 15 units before b'fast - 16 >> 10 units before dinner - Ozempic 1 mg weekly in a.m.  Tried Jardiance >> diarrhea.  We changed to: - Tresiba 18 units daily >> 6-8 units in the fasting days, 20 in nonfasting days >> 16 units daily >> 14 units daily - Ozempic 1 mg weekly in a.m. >> stopped at the end of 06/2019 (run out) >> restarted   She checks her sugars 2x a day: - am:  132-171 >> 97, 101-124 >> 83-120, 131 - after b'fast: 168 - lunch: 145, 149 >> n/c >> 95,  114 - 2h after lunch:  132 >> 115, 148 >> 108 >> n/c - before dinner:157-302 >> 150 >> n/c >> 97, 161 >>129 - at bedtime:  95-117 >> 123-239 >> 93-138, 173  + h/o CKD, latest BUN/creatinine: Lab Results  Component Value Date   BUN 19 09/17/2019   BUN 15 10/21/2018   Lab Results  Component Value Date   CREATININE 1.01 (H) 09/17/2019   CREATININE 0.90 10/21/2018  10/25/2017: BUN/creatinine: 17/1.4, GFR 37  Latest eye exam 11/2019: No DR but Macular degeneration; previously: + DR; she is getting IO injections  She has a history of hyperparathyroidism: - h/o first sx: 1978 (2 glands excised) - h/o second sx: 2010 (1 gland excised - 3.2 g)  She takes 1-2 calcium tablets a day and headaches are usually relieved by these.  Her ionized calcium is slightly low in the past but normalized at last check: Component     Latest Ref Rng & Units 10/02/2018  Calcium Ionized     4.8 - 5.6 mg/dL 4.89   Component     Latest Ref Rng & Units 02/05/2018 03/27/2018  Calcium Ionized     4.8 - 5.6 mg/dL 4.58 (L) 4.55 (L)   Total calcium level is normal: Lab Results  Component Value Date   CALCIUM 9.1 09/17/2019   CALCIUM 8.9 10/21/2018   CALCIUM 8.9 04/18/2018   CALCIUM 8.7 04/03/2018   CALCIUM 9.1 09/13/2017   Vitamin D deficiency: - she was on 1000 units vitamin D daily but this was stopped by PCP as her vitamin D was 80.  - We restarted at 1000 units daily.  She continues on this dose   Review latest vitamin D levels: Lab Results  Component Value Date   VD25OH 69.65 05/06/2019   VD25OH 57.76 10/02/2018   VD25OH 80.87 03/27/2018   VD25OH 59.0 08/19/2015   She has a history of B12 deficiency-managed by PCP.  Latest level was normal: Lab Results  Component Value Date   VITAMINB12 633 10/02/2018   VITAMINB12 >2000 (H) 08/19/2015   She also has a history of OSA-on CPAP, OA, history of melanoma, macular degeneration. She has MCI.  ROS: Constitutional: no weight gain/no weight loss,  no fatigue, no subjective hyperthermia, no subjective hypothermia Eyes: no blurry vision, no xerophthalmia ENT: no sore throat, no nodules palpated in neck, no dysphagia, no odynophagia, no hoarseness Cardiovascular: no CP/no SOB/no palpitations/no leg swelling Respiratory: no cough/no SOB/no wheezing Gastrointestinal: no N/no V/no D/no C/no acid reflux Musculoskeletal: no muscle aches/no joint aches Skin: no rashes, no hair loss Neurological: no tremors/no numbness/no tingling/no dizziness  I reviewed pt's medications, allergies, PMH, social hx, family hx, and changes were documented in the history of present illness. Otherwise, unchanged from my initial visit note.  Past Medical History:  Diagnosis Date  . Age-related macular degeneration, dry, right eye   . Age-related macular degeneration, wet, left eye ([place])   . Arthritis    "all over; particularly in hands/fingers; all my joints" (10/05/2017)  . Atrial fibrillation ([place])   . CAD (coronary artery disease)   . CHF (congestive heart failure) (HCC)     A-Fib  . Chronic kidney disease   . COPD (chronic obstructive pulmonary disease) ([place])    "very mild" (10/05/2017)  . Family history of adverse reaction to anesthesia    "daughter w/PONV & woke up during endoscopy" (10/05/2017)  . GERD (gastroesophageal reflux disease)   . Gout   . Graves' disease    S/P "radioactive tx"  . Heart disease   . History of blood transfusion    "related to shoulder replacement"   . History of gout    "no longer on daily RX" (10/05/2017)  . History of hiatal hernia   . Hyperlipidemia   . Hypertension   . Hypocalcemia   . Hypokalemia   . Hypothyroidism   . Macular degeneration   . Melanoma ([place]) ~ 2012   "off my back" (10/05/2017)  . Myocardial infarction [facility])    "was told I'd had one; don't really know when" (10/05/2017)  . Orthostatic hypotension 2020  . OSA on CPAP   . Pacemaker   . 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 Renal  disorder   . 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 Sinus node dysfunction (HCC)   . Type II diabetes mellitus ([place])   . Vitamin B12 deficiency   . Vitamin D deficiency    Past Surgical History:  Procedure Laterality Date  . APPENDECTOMY    . ATRIAL FIBRILLATION ABLATION N/A 10/05/2017   Procedure: ATRIAL FIBRILLATION ABLATION;  Surgeon: Constance Haw, MD;  Location: [place] [facility];  Service: Cardiovascular;  Laterality: N/A;  . ATRIAL FIBRILLATION ABLATION  ~ 2014  . CARDIAC CATHETERIZATION  04/2017  . COLONOSCOPY W/ BIOPSIES AND POLYPECTOMY  2015   1 polyp removed- repeat 3 years  . CORONARY ANGIOPLASTY WITH STENT PLACEMENT  ~ 2014  . EP IMPLANTABLE DEVICE N/A 11/10/2015   Procedure: Pacemaker Implant;  Surgeon: Will [name], MD;  Location: [place] [facility];  Service: Cardiovascular;  Laterality: N/A;  . EP IMPLANTABLE DEVICE N/A 11/11/2015   Procedure: Lead Revision/Repair;  Surgeon: Evans Lance, MD;  Location: [place] [facility];  Service: Cardiovascular;  Laterality: N/A;  . INGUINAL HERNIA REPAIR Left 1951  . JOINT REPLACEMENT    . LAPAROSCOPIC CHOLECYSTECTOMY    . MELANOMA EXCISION  ~ 2012   "off my back" (10/05/2017)  . PARATHYROIDECTOMY     "removed 3 glands; still have 1 gland" (10/05/2017)  . RIGHT/LEFT HEART CATH AND CORONARY ANGIOGRAPHY N/A 05/03/2017   Procedure: RIGHT/LEFT HEART CATH AND CORONARY ANGIOGRAPHY;  Surgeon: Leonie Man, MD;  Location: [place] [facility];  Service: Cardiovascular;  Laterality: N/A;  . TONSILLECTOMY    . TOTAL SHOULDER ARTHROPLASTY Right x 2  . VAGINAL HYSTERECTOMY     "total"   Social History   Socioeconomic History  . Marital status: Married    Spouse name: Not on file  . Number of children: 3  . Years of education: Not on file  . Highest education level: Not on file  Occupational History  . Occupation: Retired Animal nutritionist  . Financial resource strain:  Not on file  . Food  insecurity:    Worry: Not on file    Inability: Not on file  . Transportation needs:    Medical: Not on file    Non-medical: Not on file  Tobacco Use  . Smoking status: Former Smoker, quit in 1967    Packs/day: 2.00    Years: 3.00    Pack years: 6.00    Types: Cigarettes  . Smokeless tobacco: Never Used  . Tobacco comment: 10/05/2017 "smoked in my 20's"  Substance and Sexual Activity  . Alcohol use: Yes    Comment: 1-2 mixed drinks per month  . Drug use: Never  Lifestyle  Relationships  Social History Narrative   Lives in [place] w/ husband.  She walks her dog every day.  Current Outpatient Medications on File Prior to Visit  Medication Sig Dispense Refill  . apixaban (ELIQUIS) 5 MG TABS tablet Take 1 tablet (5 mg total) by mouth 2 (two) times daily. 180 tablet 1  . Ascorbic Acid (VITAMIN C PO) Take by mouth daily.    . carboxymethylcellulose (REFRESH PLUS) 0.5 % SOLN Place 1 drop into both eyes 3 (three) times daily as needed.    . Cholecalciferol (VITAMIN D3) 5000 units TABS Take 1 tablet by mouth every morning.    Marland Kitchen glucose blood (ACCU-CHEK AVIVA PLUS) test strip Use as instructed to check blood sugar 2 times a day 200 each 12  . insulin degludec (TRESIBA FLEXTOUCH) 200 UNIT/ML FlexTouch Pen Inject 6-20 Units into the skin daily. 9 mL 2  . Insulin Syringe-Needle U-100 (INSULIN SYRINGE .3CC/31GX5/16") 31G X 5/16" 0.3 ML MISC Use to inject insulin twice daily 200 each 3  . levothyroxine (SYNTHROID) 112 MCG tablet Take 1 tablet (112 mcg total) by mouth daily. 45 tablet 3  . Multiple Vitamins-Minerals (ICAPS AREDS 2) CAPS Take 1 capsule by mouth 2 (two) times daily.    . nitroGLYCERIN (NITROSTAT) 0.4 MG SL tablet Place 0.4 mg under the tongue every 5 (five) minutes as needed for chest pain.    . pantoprazole (PROTONIX) 40 MG tablet Take 1 tablet (40 mg total) by mouth daily. 90 tablet 3  . pyridostigmine (MESTINON) 60 MG tablet TAKE 1/2 TAB TWICE DAILY 90 tablet 0  .  rosuvastatin (CRESTOR) 20 MG tablet Take 20 mg by mouth daily.    . Semaglutide, 1 MG/DOSE, (OZEMPIC, 1 MG/DOSE,) 2 MG/1.5ML SOPN INJECT 1 MG INTO THE SKIN ONCE WEEKLY 3 pen 3  . vitamin B-12 (CYANOCOBALAMIN) 500 MCG tablet Take 500 mcg by mouth daily.     No current facility-administered medications on file prior to visit.   Allergies  Allergen Reactions  . Demerol [Meperidine] Anaphylaxis    Tolerated Fentanyl 11/10/15  . Multaq [Dronedarone] Diarrhea  . Sulfa Antibiotics Anaphylaxis  . Tetracyclines & Related Other (See Comments) and Swelling    Made nose, lips,  And tongue itchy Made nose, lips,  And tongue itchy  . Epinephrine     Powder- SOB, palpitations   Family History  Problem Relation Age of Onset  . Thyroid disease Mother 33       3 days after surgery  . Heart defect Father 32       ?ascending aortic aneurysm  . Breast cancer Sister   . Hepatitis Brother   . Colon cancer Maternal Grandmother   . Diabetes Maternal Grandmother   . Diabetes Maternal Grandfather   . Colon cancer Paternal Grandmother        37's  .  Rectal cancer Son 30  . Breast cancer Half-Sister   . Diabetes Paternal Uncle   . Dementia Paternal Uncle   . Colon polyps Neg Hx   . Stomach cancer Neg Hx   . Liver cancer Neg Hx   . Pancreatic cancer Neg Hx    Also: -Diabetes in maternal grandparents -Hypertension and uncle-hyperlipidemia in the whole family -Heart disease in father, mother, uncle-thyroid problems in mother and sister-cancer in son, grandmother, half sister  PE: BP (!) 142/86   Pulse (!) 58   Ht 5\' 3"  (1.6 m)   Wt 176 lb (79.8 kg)   SpO2 96%   BMI 31.18 kg/m  Wt Readings from Last 3 Encounters:  11/29/19 176 lb (79.8 kg)  09/17/19 179 lb 4 oz (81.3 kg)  07/29/19 178 lb (80.7 kg)   Constitutional: overweight, in NAD Eyes: PERRLA, EOMI, no exophthalmos ENT: moist mucous membranes, no thyromegaly, no cervical lymphadenopathy Cardiovascular: RRR, No MRG Respiratory: CTA  B Gastrointestinal: abdomen soft, NT, ND, BS+ Musculoskeletal: no deformities, strength intact in all 4 Skin: moist, warm, no rashes Neurological: no tremor with outstretched hands, DTR normal in all 4  ASSESSMENT: 1. Postablative Hypothyroidism  2.  Insulin-dependent DM2  3.  History of hypocalcemia -History of hyperparathyroidism  4.  Vitamin D deficiency  PLAN:  1. Patient with longstanding, uncontrolled, possibly with hypothyroidism, with a long history of suppressed TSH, now with normal TFTs.  We discussed in the past about possible side effects of over replacement with thyroid hormones  to include cardiac arrhythmia (she has a history of atrial fibrillation), hypercoagulability and, in the long run, osteoporosis and increased mortality. - latest thyroid labs reviewed with pt >> normal: Lab Results  Component Value Date   TSH 1.27 07/29/2019   - she continues on LT4 121 mcg daily - pt feels good on this dose. - we discussed about taking the thyroid hormone every day, with water, >30 minutes before breakfast, separated by >4 hours from acid reflux medications, calcium, iron, multivitamins. Pt. is taking it correctly. - will check thyroid tests at next visit  2. DM2 -insulin-dependent -Patient with longstanding, previously uncontrolled type 2 diabetes, now with drastically improved control after starting to do intermittent fasting.  We stopped premixed insulin and started [place].  We were able to decrease the doses of Tresiba, currently on 14 units daily.  Since sugars remain controlled and are almost all at goal, we discussed about reducing this to 8 units daily and, if sugars remain controlled, to stop the insulin completely.  I did advise her to check sugars consistently after she does that as we may need to restart insulin in the future. -We restarted Ozempic since last visit and she is tolerated it well.  We will continue with this -Also, continue intermittent fasting.  She is  fasting for 16 hours and eating for 8 hours daily -I advised her to: Patient Instructions  Please continue: - Ozempic 1 mg weekly  Please decrease: - Tresiba 8 units daily and can stop in 2 weeks if sugars remain well controlled  Please continue: - Levothyroxine 112 mcg daily  Take the thyroid hormone every day, with water, at least 30 minutes before breakfast, separated by at least 4 hours from: - acid reflux medications - calcium - iron - multivitamins  Please return in 4 months with your sugar log.   - we checked her HbA1c: 5.9% (excellent) - advised to check sugars at different times of the day - 1-2x  a day, rotating check times - advised for yearly eye exams >> she is UTD - return to clinic in 4 months  3. H/o Hypocalcemia -She has a history of hyperparathyroidism, now status post 3/4 parathyroidectomy -Her latest calcium level was reviewed and this was normal: Lab Results  Component Value Date   CALCIUM 9.1 09/17/2019   PHOS 4.1 05/03/2017  -She continues 1 Tums tablet with lunch and 1 later in the afternoon  4.  Vitamin D deficiency -Her latest vitamin D level was normal -She continues on 1000 units vitamin D daily -We will recheck this at next visit   Philemon Kingdom, MD PhD [facility] Endocrinology

## 2019-11-29 NOTE — Patient Instructions (Addendum)
Please continue: - Ozempic 1 mg weekly  Please decrease: - Tresiba 8 units daily and can stop in 2 weeks if sugars remain well controlled  Please continue: - Levothyroxine 112 mcg daily  Take the thyroid hormone every day, with water, at least 30 minutes before breakfast, separated by at least 4 hours from: - acid reflux medications - calcium - iron - multivitamins  Please return in 4 months with your sugar log.

## 2019-12-17 ENCOUNTER — Ambulatory Visit (INDEPENDENT_AMBULATORY_CARE_PROVIDER_SITE_OTHER): Payer: Medicare Other

## 2019-12-17 DIAGNOSIS — I442 Atrioventricular block, complete: Secondary | ICD-10-CM | POA: Diagnosis not present

## 2019-12-18 LAB — CUP PACEART REMOTE DEVICE CHECK
Battery Remaining Longevity: 121 mo
Battery Remaining Percentage: 95.5 %
Battery Voltage: 3.01 V
Brady Statistic AP VP Percent: 43 %
Brady Statistic AP VS Percent: 22 %
Brady Statistic AS VP Percent: 4.9 %
Brady Statistic AS VS Percent: 31 %
Brady Statistic RA Percent Paced: 60 %
Brady Statistic RV Percent Paced: 46 %
Date Time Interrogation Session: 20211005020016
Implantable Lead Implant Date: 20170829
Implantable Lead Implant Date: 20170829
Implantable Lead Location: 753859
Implantable Lead Location: 753860
Implantable Pulse Generator Implant Date: 20170829
Lead Channel Impedance Value: 390 Ohm
Lead Channel Impedance Value: 530 Ohm
Lead Channel Pacing Threshold Amplitude: 0.75 V
Lead Channel Pacing Threshold Amplitude: 1 V
Lead Channel Pacing Threshold Pulse Width: 0.4 ms
Lead Channel Pacing Threshold Pulse Width: 0.4 ms
Lead Channel Sensing Intrinsic Amplitude: 12 mV
Lead Channel Sensing Intrinsic Amplitude: 2.8 mV
Lead Channel Setting Pacing Amplitude: 2 V
Lead Channel Setting Pacing Amplitude: 2.5 V
Lead Channel Setting Pacing Pulse Width: 0.4 ms
Lead Channel Setting Sensing Sensitivity: 2 mV
Pulse Gen Model: 2272
Pulse Gen Serial Number: 7945290

## 2019-12-20 NOTE — Progress Notes (Signed)
Remote pacemaker transmission.   

## 2020-01-22 ENCOUNTER — Other Ambulatory Visit: Payer: Self-pay | Admitting: Internal Medicine

## 2020-01-22 NOTE — Telephone Encounter (Signed)
Prescription refill request for Eliquis received.  Last office visit: Dawn Garcia 09/17/2019 Scr: 1.01, 09/17/2019 Age: 79 yo Weight: 79.8 kg  Prescription refill sent.

## 2020-03-20 ENCOUNTER — Ambulatory Visit (INDEPENDENT_AMBULATORY_CARE_PROVIDER_SITE_OTHER): Payer: Medicare Other

## 2020-03-20 DIAGNOSIS — I442 Atrioventricular block, complete: Secondary | ICD-10-CM | POA: Diagnosis not present

## 2020-03-20 LAB — CUP PACEART REMOTE DEVICE CHECK
Battery Remaining Longevity: 119 mo
Battery Remaining Percentage: 95.5 %
Battery Voltage: 3.01 V
Brady Statistic AP VP Percent: 49 %
Brady Statistic AP VS Percent: 22 %
Brady Statistic AS VP Percent: 4.3 %
Brady Statistic AS VS Percent: 25 %
Brady Statistic RA Percent Paced: 65 %
Brady Statistic RV Percent Paced: 51 %
Date Time Interrogation Session: 20220107040015
Implantable Lead Implant Date: 20170829
Implantable Lead Implant Date: 20170829
Implantable Lead Location: 753859
Implantable Lead Location: 753860
Implantable Pulse Generator Implant Date: 20170829
Lead Channel Impedance Value: 410 Ohm
Lead Channel Impedance Value: 550 Ohm
Lead Channel Pacing Threshold Amplitude: 0.75 V
Lead Channel Pacing Threshold Amplitude: 1.125 V
Lead Channel Pacing Threshold Pulse Width: 0.4 ms
Lead Channel Pacing Threshold Pulse Width: 0.4 ms
Lead Channel Sensing Intrinsic Amplitude: 12 mV
Lead Channel Sensing Intrinsic Amplitude: 3.1 mV
Lead Channel Setting Pacing Amplitude: 2.125
Lead Channel Setting Pacing Amplitude: 2.5 V
Lead Channel Setting Pacing Pulse Width: 0.4 ms
Lead Channel Setting Sensing Sensitivity: 2 mV
Pulse Gen Model: 2272
Pulse Gen Serial Number: 7945290

## 2020-03-31 ENCOUNTER — Encounter: Payer: Self-pay | Admitting: Internal Medicine

## 2020-03-31 ENCOUNTER — Other Ambulatory Visit: Payer: Self-pay

## 2020-03-31 ENCOUNTER — Ambulatory Visit: Payer: Medicare Other | Admitting: Internal Medicine

## 2020-03-31 VITALS — BP 130/90 | HR 58 | Ht 63.0 in | Wt 172.8 lb

## 2020-03-31 DIAGNOSIS — E892 Postprocedural hypoparathyroidism: Secondary | ICD-10-CM | POA: Diagnosis not present

## 2020-03-31 DIAGNOSIS — E1121 Type 2 diabetes mellitus with diabetic nephropathy: Secondary | ICD-10-CM

## 2020-03-31 DIAGNOSIS — E559 Vitamin D deficiency, unspecified: Secondary | ICD-10-CM

## 2020-03-31 DIAGNOSIS — E890 Postprocedural hypothyroidism: Secondary | ICD-10-CM

## 2020-03-31 LAB — T4, FREE: Free T4: 1.28 ng/dL (ref 0.60–1.60)

## 2020-03-31 LAB — POCT GLYCOSYLATED HEMOGLOBIN (HGB A1C): Hemoglobin A1C: 6.9 % — AB (ref 4.0–5.6)

## 2020-03-31 LAB — TSH: TSH: 0.88 u[IU]/mL (ref 0.35–4.50)

## 2020-03-31 MED ORDER — EMPAGLIFLOZIN 10 MG PO TABS: 10.0000 mg | ORAL_TABLET | Freq: Every day | ORAL | 3 refills | Status: DC

## 2020-03-31 NOTE — Patient Instructions (Addendum)
Please continue: - Ozempic 1 mg weekly  Please start: - Jardiance 10 mg daily before b'fast  Please continue: - Levothyroxine 112 mcg daily  Take the thyroid hormone every day, with water, at least 30 minutes before breakfast, separated by at least 4 hours from: - acid reflux medications - calcium - iron - multivitamins  Please move Protonix at least 3-4 hours after Levothyroxine.  Please return in 4 months with your sugar log.

## 2020-03-31 NOTE — Progress Notes (Signed)
Patient ID: Lucee Pezzulo [facility], female   DOB: 11/15/1940, 80 y.o.   MRN: [id number]   This visit occurred during the SARS-CoV-2 public health emergency.  Safety protocols were in place, including screening questions prior to the visit, additional usage of staff PPE, and extensive cleaning of exam room while observing appropriate contact time as indicated for disinfecting solutions.   HPI  Dejanira Hanthorn Emmer is a 80 y.o.-year-old female, initially referred by her PCP, Dr. Welton Flakes, presenting for follow-up for uncontrolled post ablative hypothyroidism and DM2, insulin-dependent since 2013, with long-term complications (atrial fibrillation, CHF, CKD).  Last visit 4 months ago.  She continues intermittent fasting, started 04/2018.  Currently doing 16-8 when dose.  She lost more than 40 pounds since she started.  Post ablative hypothyroidism: Pt. has been dx with Graves ds. At 80 y/o. She had RAI Tx in 1960 >> postablative hypothyroidism  >> started on levothyroxine.  Patient had consistently suppressed TSH levels over the years so we are not decreasing her doses very slowly due to fatigue.  We decreased her levothyroxine 03/2019.  Pt is on levothyroxine 112 mcg daily, taken: - in am - fasting - at least 30 min from b'fast - + calcium more than 4 hours after - no iron - no multivitamins - + PPIs >> moved <1h after LT4 since last visit! - + B12   She was on amiodarone in 03/2017, but stopped in 04/2017 due to AKI.  Reviewed her TFTs: Lab Results  Component Value Date   TSH 1.27 07/29/2019   TSH 0.71 05/06/2019   TSH 0.20 (L) 03/20/2019   TSH 0.05 (L) 12/27/2018   TSH 0.03 (L) 10/02/2018   TSH 0.34 (L) 03/27/2018   TSH 0.04 (L) 02/05/2018   TSH 0.198 (L) 04/26/2017   TSH 0.174 (L) 04/18/2017   TSH 0.034 (L) 03/10/2016   FREET4 1.08 07/29/2019   FREET4 1.13 05/06/2019   FREET4 1.25 03/20/2019   FREET4 1.70 (H) 12/27/2018   FREET4 1.35 10/02/2018   FREET4 1.55 03/27/2018   FREET4  1.40 02/05/2018   FREET4 3.54 (H) 04/26/2017   FREET4 2.08 (H) 11/08/2015   FREET4 2.18 (H) 09/09/2015   T3FREE 3.4 02/05/2018   T3FREE 1.8 (L) 04/26/2017   T3FREE 3.0 11/08/2015   T3FREE 2.8 09/09/2015   T3FREE 3.1 08/19/2015  10/25/2017: TSH 0.021 (0.45-5.33), free T4 1.7 (0.66-1.14)  Her Graves' antibodies were not elevated: Lab Results  Component Value Date   TSI 102 02/05/2018    No results found for: THGAB No components found for: TPOAB  Pt denies: - feeling nodules in neck - hoarseness - dysphagia - choking - SOB with lying down  She has + FH of thyroid disorders in: mother (died after sx for toxic goiter) and sister (goiter).  No FH of thyroid cancer. No h/o radiation tx to head or neck other than RAI treatment.  No herbal supplements. No Biotin use. No recent steroids use.   She has a history of Graves' ophthalmopathy >> had increased IO pressure and did not respond to IV steroids >> she had radiation therapy and then eyelid surgery.  DM2: -Insulin-dependent since 2013.  Reviewed HbA1c levels: Lab Results  Component Value Date   HGBA1C 5.9 (A) 11/29/2019   HGBA1C 6.1 (A) 07/29/2019   HGBA1C 7.1 (A) 04/02/2019   HGBA1C 6.5 (A) 12/27/2018   HGBA1C 6.8 (A) 10/02/2018   HGBA1C 8.5 (A) 06/27/2018   HGBA1C 8.4 (A) 02/05/2018   HGBA1C 9.4 (H) 04/28/2017  HGBA1C 7.8 (H) 11/29/2016   HGBA1C 8.1 (H) 09/05/2016  08/14/2018: HbA1c 8.0%  10/25/2017: HbA1c 8.4%  Previously on: - Novolin 70/30 -not taken in the day she is fasting: - 20 >> 15 units before b'fast - 16 >> 10 units before dinner - Ozempic 1 mg weekly in a.m.  Tried Jardiance >> diarrhea.  We changed to: -  stopped 12/2019 - Ozempic 1 mg weekly in a.m.  She checks her sugars twice a day: - am:  132-171 >> 97, 101-124 >> 83-120, 131 >> 135-150 - after b'fast: 168 >> n/c - lunch: 145, 149 >> n/c >> 95, 114 >> 140s - 2h after lunch:  132 >> 115, 148 >> 108 >> n/c >> 150-177 - before dinner:157-302 >>  150 >> n/c >> 97, 161 >> 129 >> 140 - at bedtime: 123-239 >> 93-138, 173 >> 170-180s, 200 x1  + History of CKD, latest BUN/creatinine: Lab Results  Component Value Date   BUN 19 09/17/2019   BUN 15 10/21/2018   Lab Results  Component Value Date   CREATININE 1.01 (H) 09/17/2019   CREATININE 0.90 10/21/2018  10/25/2017: BUN/creatinine: 17/1.4, GFR 37  Latest eye exam 11/2019: No DR but macular degeneration; previously: + DR; she continues IO injections  She has a history of hyperparathyroidism: - h/o first sx: 1978 (2 glands excised) - h/o second sx: 2010 (1 gland excised - 3.2 g)  She takes 1-2 calcium tablets a day and headaches are usually relieved by these.  Her ionized calcium was slightly low in the past but normalized at last check: Component     Latest Ref Rng & Units 10/02/2018  Calcium Ionized     4.8 - 5.6 mg/dL 4.89   Component     Latest Ref Rng & Units 02/05/2018 03/27/2018  Calcium Ionized     4.8 - 5.6 mg/dL 4.58 (L) 4.55 (L)   Total calcium level was normal Lab Results  Component Value Date   CALCIUM 9.1 09/17/2019   CALCIUM 8.9 10/21/2018   CALCIUM 8.9 04/18/2018   CALCIUM 8.7 04/03/2018   CALCIUM 9.1 09/13/2017   She has vitamin D deficiency: - she was on 1000 units vitamin D daily but this was stopped by PCP as her vitamin D was 80.  - we restarted 1000 units daily that she continues on this dose.   Reviewed latest vitamin D level: Lab Results  Component Value Date   VD25OH 69.65 05/06/2019   VD25OH 57.76 10/02/2018   VD25OH 80.87 03/27/2018   VD25OH 59.0 08/19/2015   She has a history of B12 deficiency-managed by PCP.  Latest level was normal Lab Results  Component Value Date   [id number] 10/02/2018   VITAMINB12 >2000 (H) 08/19/2015   She also has a history of OSA-on CPAP, OA, history of melanoma, macular degeneration. She has MCI.  ROS: Constitutional: no weight gain/no weight loss, + fatigue, no subjective hyperthermia, no  subjective hypothermia Eyes: no blurry vision, no xerophthalmia, + periocular swelling ENT: no sore throat, + see HPI Cardiovascular: no CP/no SOB/no palpitations/no leg swelling Respiratory: no cough/no SOB/no wheezing Gastrointestinal: no N/no V/no D/no C/no acid reflux Musculoskeletal: no muscle aches/no joint aches Skin: no rashes, no hair loss Neurological: no tremors/no numbness/no tingling/no dizziness  I reviewed pt's medications, allergies, PMH, social hx, family hx, and changes were documented in the history of present illness. Otherwise, unchanged from my initial visit note.  Past Medical History:  Diagnosis Date  . Age-related macular degeneration, dry,  right eye   . Age-related macular degeneration, wet, left eye ([place])   . Arthritis    "all over; particularly in hands/fingers; all my joints" (10/05/2017)  . Atrial fibrillation ([place])   . CAD (coronary artery disease)   . CHF (congestive heart failure) (HCC)     A-Fib  . Chronic kidney disease   . COPD (chronic obstructive pulmonary disease) ([place])    "very mild" (10/05/2017)  . Family history of adverse reaction to anesthesia    "daughter w/PONV & woke up during endoscopy" (10/05/2017)  . GERD (gastroesophageal reflux disease)   . Gout   . Graves' disease    S/P "radioactive tx"  . Heart disease   . History of blood transfusion    "related to shoulder replacement"   . History of gout    "no longer on daily RX" (10/05/2017)  . History of hiatal hernia   . Hyperlipidemia   . Hypertension   . Hypocalcemia   . Hypokalemia   . Hypothyroidism   . Macular degeneration   . Melanoma ([place]) ~ 2012   "off my back" (10/05/2017)  . Myocardial infarction [facility])    "was told I'd had one; don't really know when" (10/05/2017)  . Orthostatic hypotension 2020  . OSA on CPAP   . Pacemaker   . 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 Renal disorder   . 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 Sinus  node dysfunction (HCC)   . Type II diabetes mellitus ([place])   . Vitamin B12 deficiency   . Vitamin D deficiency    Past Surgical History:  Procedure Laterality Date  . APPENDECTOMY    . ATRIAL FIBRILLATION ABLATION N/A 10/05/2017   Procedure: ATRIAL FIBRILLATION ABLATION;  Surgeon: Constance Haw, MD;  Location: [place] [facility];  Service: Cardiovascular;  Laterality: N/A;  . ATRIAL FIBRILLATION ABLATION  ~ 2014  . CARDIAC CATHETERIZATION  04/2017  . COLONOSCOPY W/ BIOPSIES AND POLYPECTOMY  2015   1 polyp removed- repeat 3 years  . CORONARY ANGIOPLASTY WITH STENT PLACEMENT  ~ 2014  . EP IMPLANTABLE DEVICE N/A 11/10/2015   Procedure: Pacemaker Implant;  Surgeon: Will [name], MD;  Location: [place] [facility];  Service: Cardiovascular;  Laterality: N/A;  . EP IMPLANTABLE DEVICE N/A 11/11/2015   Procedure: Lead Revision/Repair;  Surgeon: Evans Lance, MD;  Location: [place] [facility];  Service: Cardiovascular;  Laterality: N/A;  . INGUINAL HERNIA REPAIR Left 1951  . JOINT REPLACEMENT    . LAPAROSCOPIC CHOLECYSTECTOMY    . MELANOMA EXCISION  ~ 2012   "off my back" (10/05/2017)  . PARATHYROIDECTOMY     "removed 3 glands; still have 1 gland" (10/05/2017)  . RIGHT/LEFT HEART CATH AND CORONARY ANGIOGRAPHY N/A 05/03/2017   Procedure: RIGHT/LEFT HEART CATH AND CORONARY ANGIOGRAPHY;  Surgeon: Leonie Man, MD;  Location: [facility];  Service: Cardiovascular;  Laterality: N/A;  . TONSILLECTOMY    . TOTAL SHOULDER ARTHROPLASTY Right x 2  . VAGINAL HYSTERECTOMY     "total"   Social History   Socioeconomic History  . Marital status: Married    Spouse name: Not on file  . Number of children: 3  . Years of education: Not on file  . Highest education level: Not on file  Occupational History  . Occupation: Retired Animal nutritionist  . Financial resource strain: Not on file  . Food insecurity:    Worry: Not  on file    Inability: Not on file  . Transportation needs:     Medical: Not on file    Non-medical: Not on file  Tobacco Use  . Smoking status: Former Smoker, quit in 1967    Packs/day: 2.00    Years: 3.00    Pack years: 6.00    Types: Cigarettes  . Smokeless tobacco: Never Used  . Tobacco comment: 10/05/2017 "smoked in my 20's"  Substance and Sexual Activity  . Alcohol use: Yes    Comment: 1-2 mixed drinks per month  . Drug use: Never  Lifestyle  Relationships  Social History Narrative   Lives in [place] w/ husband.  She walks her dog every day.  Current Outpatient Medications on File Prior to Visit  Medication Sig Dispense Refill  . Ascorbic Acid (VITAMIN C PO) Take by mouth daily.    . carboxymethylcellulose (REFRESH PLUS) 0.5 % SOLN Place 1 drop into both eyes 3 (three) times daily as needed.    . Cholecalciferol (VITAMIN D3) 5000 units TABS Take 1 tablet by mouth every morning.    Marland Kitchen ELIQUIS 5 MG TABS tablet TAKE 1 TABLET (5 MG TOTAL) BY MOUTH 2 (TWO) TIMES DAILY. 180 tablet 1  . glucose blood (ACCU-CHEK AVIVA PLUS) test strip Use as instructed to check blood sugar 2 times a day 200 each 12  . insulin degludec (TRESIBA FLEXTOUCH) 200 UNIT/ML FlexTouch Pen Inject 8 Units into the skin daily. 9 mL 2  . Insulin Syringe-Needle U-100 (INSULIN SYRINGE .3CC/31GX5/16") 31G X 5/16" 0.3 ML MISC Use to inject insulin twice daily 200 each 3  . levothyroxine (SYNTHROID) 112 MCG tablet TAKE 1 TABLET (112 MCG TOTAL) BY MOUTH DAILY. 90 tablet 3  . Multiple Vitamins-Minerals (ICAPS AREDS 2) CAPS Take 1 capsule by mouth 2 (two) times daily.    . nitroGLYCERIN (NITROSTAT) 0.4 MG SL tablet Place 0.4 mg under the tongue every 5 (five) minutes as needed for chest pain.    . pantoprazole (PROTONIX) 40 MG tablet Take 1 tablet (40 mg total) by mouth daily. 90 tablet 3  . pyridostigmine (MESTINON) 60 MG tablet TAKE 1/2 TAB TWICE DAILY 90 tablet 0  . rosuvastatin (CRESTOR) 20 MG tablet Take 20 mg by mouth daily.    . Semaglutide, 1 MG/DOSE, (OZEMPIC, 1  MG/DOSE,) 2 MG/1.5ML SOPN INJECT 1 MG INTO THE SKIN ONCE WEEKLY 3 pen 3  . vitamin B-12 (CYANOCOBALAMIN) 500 MCG tablet Take 500 mcg by mouth daily.     No current facility-administered medications on file prior to visit.   Allergies  Allergen Reactions  . Demerol [Meperidine] Anaphylaxis    Tolerated Fentanyl 11/10/15  . Multaq [Dronedarone] Diarrhea  . Sulfa Antibiotics Anaphylaxis  . Tetracyclines & Related Other (See Comments) and Swelling    Made nose, lips,  And tongue itchy Made nose, lips,  And tongue itchy  . Epinephrine     Powder- SOB, palpitations   Family History  Problem Relation Age of Onset  . Thyroid disease Mother 39       3 days after surgery  . Heart defect Father 28       ?ascending aortic aneurysm  . Breast cancer Sister   . Hepatitis Brother   . Colon cancer Maternal Grandmother   . Diabetes Maternal Grandmother   . Diabetes Maternal Grandfather   . Colon cancer Paternal Grandmother        51's  . Rectal cancer Son 22  . Breast cancer Half-Sister   .  Diabetes Paternal Uncle   . Dementia Paternal Uncle   . Colon polyps Neg Hx   . Stomach cancer Neg Hx   . Liver cancer Neg Hx   . Pancreatic cancer Neg Hx    Also: -Diabetes in maternal grandparents -Hypertension and uncle-hyperlipidemia in the whole family -Heart disease in father, mother, uncle-thyroid problems in mother and sister-cancer in son, grandmother, half sister  PE: BP 130/90   Pulse (!) 58   Ht 5\' 3"  (1.6 m)   Wt 172 lb 12.8 oz (78.4 kg)   SpO2 97%   BMI 30.61 kg/m  Wt Readings from Last 3 Encounters:  03/31/20 172 lb 12.8 oz (78.4 kg)  11/29/19 176 lb (79.8 kg)  09/17/19 179 lb 4 oz (81.3 kg)   Constitutional: overweight, in NAD Eyes: PERRLA, EOMI, no exophthalmos, + mild sup. and inf. palpebral edema B ENT: moist mucous membranes, no thyromegaly, no cervical lymphadenopathy Cardiovascular: RRR, No MRG Respiratory: CTA B Gastrointestinal: abdomen soft, NT, ND,  BS+ Musculoskeletal: no deformities, strength intact in all 4 Skin: moist, warm, no rashes Neurological: no tremor with outstretched hands, DTR normal in all 4  ASSESSMENT: 1. Postablative Hypothyroidism  2.  Insulin-dependent DM2  3.  History of hypocalcemia -History of hyperparathyroidism  4.  Vitamin D deficiency  PLAN:  1. Patient with longstanding, uncontrolled, possibly with hypothyroidism, with a long history of suppressed TSH, now with normal TFTs.  We discussed in the past about possible side effects of over replacement with thyroid hormones  to include cardiac arrhythmia (she has a history of atrial fibrillation), hypercoagulability and, in the long run, osteoporosis and increased mortality. - latest thyroid labs reviewed with pt >> normal: Lab Results  Component Value Date   TSH 1.27 07/29/2019   - she continues on LT4 112 mcg daily - pt feels good on this dose, but she feels fatigued and complains of her eyes being more swollen - we discussed about taking the thyroid hormone every day, with water, >30 minutes before breakfast, separated by >4 hours from acid reflux medications, calcium, iron, multivitamins. Pt. was taking it correctly at last visit, but since then, she moved her PPI early in the day, less than 30 minutes after levothyroxine.  I believe that this is the reason for her fatigue and possibly also swelling around her eyes.  I advised about moving PPIs at least 3 to 4 hours later. - will check thyroid tests at next visit  2. DM2 -insulin-dependent -Patient with longstanding, previously uncontrolled type 2 diabetes, now with drastically improved control after she started intermittent fasting.  At last visit, HbA1c was 5.9%! -We restarted Ozempic before last visit and she tolerates it well.  In fact, we were able to decrease the dose of Tresiba at that time and I advised her to stop if sugars remain controlled.  She did stop [place] 3 months ago but she noticed that  her sugars increased afterwards. -Also, she continues intermittent fasting, fasting for 16 hours a day and eating during 8 hours a day. -At today's visit, we discussed about adding an SGLT2 inhibitor.  She had diarrhea with Jardiance, but I explained that this is a very unusual side effect of Jardiance and discussed about giving this document another try.  Explained the benefits and possible side effects.  I advised her to stay well-hydrated.  We discussed about the fact that if Vania Rea is not covered, we can try [place].  If only Invokana 300 mg tablets are covered, I  would recommend to take half a tablet daily.  We will wait for the response from the pharmacy. -I advised her to: Patient Instructions  Please continue: - Ozempic 1 mg weekly  Please start: - Jardiance 10 mg daily before b'fast  Please continue: - Levothyroxine 112 mcg daily  Take the thyroid hormone every day, with water, at least 30 minutes before breakfast, separated by at least 4 hours from: - acid reflux medications - calcium - iron - multivitamins  Please move Protonix at least 3-4 hours after Levothyroxine.  Please return in 4 months with your sugar log.   - we checked her HbA1c: 6.9% (higher) - advised to check sugars at different times of the day - 1x a day, rotating check times - advised for yearly eye exams >> she is UTD - return to clinic in 4 months  3. H/o Hypocalcemia -She has a history of Hyperparathyroidism, now status post 3/4 parathyroidectomy -Latest calcium level was reviewed and this was normal: Lab Results  Component Value Date   CALCIUM 9.1 09/17/2019   PHOS 4.1 05/03/2017  -She continues 1 Tums tablet with lunch and 1 later in the afternoon  4.  Vitamin D deficiency -Her latest vitamin D level was normal 04/2019 -She continues on 1000 units vitamin D daily -We will recheck her vitamin D level today  Component     Latest Ref Rng & Units 03/31/2020          TSH     0.35 - 4.50  uIU/mL 0.88  T4,Free(Direct)     0.60 - 1.60 ng/dL 1.28  Hemoglobin A1C     4.0 - 5.6 % 6.9 (A)  Glucose     65 - 99 mg/dL 129 (H)  Vitamin D, 25-Hydroxy     30.0 - 100.0 ng/mL 79.5  C-Peptide     0.80 - 3.85 ng/mL 4.07 (H)  Islet Cell Ab     Neg:<1:1 Negative  ZNT8 Antibodies     <15 U/mL <10  Glutamic Acid Decarb Ab     <5 IU/mL 14 (H)  She has good insulin production, but 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  We will manage this as type 2 diabetes but we have to be careful, since she may require insulin in the near future.  I will let the patient know about the risk of euglycemic DKA with SGLT2 inhibitors.  Philemon Kingdom, MD PhD [facility]

## 2020-04-01 LAB — ANTI-ISLET CELL ANTIBODY: Islet Cell Ab: NEGATIVE

## 2020-04-01 LAB — VITAMIN D 25 HYDROXY (VIT D DEFICIENCY, FRACTURES): Vit D, 25-Hydroxy: 79.5 ng/mL (ref 30.0–100.0)

## 2020-04-03 NOTE — Progress Notes (Signed)
Remote pacemaker transmission.   

## 2020-04-05 LAB — C-PEPTIDE: C-Peptide: 4.07 ng/mL — ABNORMAL HIGH (ref 0.80–3.85)

## 2020-04-05 LAB — GLUCOSE, FASTING: Glucose, Bld: 129 mg/dL — ABNORMAL HIGH (ref 65–99)

## 2020-04-05 LAB — GLUTAMIC ACID DECARBOXYLASE AUTO ABS: Glutamic Acid Decarb Ab: 14 IU/mL — ABNORMAL HIGH (ref <5)

## 2020-04-05 LAB — ZNT8 ANTIBODIES: ZNT8 Antibodies: <10 U/mL (ref <15)

## 2020-04-07 ENCOUNTER — Encounter: Payer: Self-pay | Admitting: Internal Medicine

## 2020-04-10 ENCOUNTER — Encounter: Payer: Self-pay | Admitting: Internal Medicine

## 2020-05-07 ENCOUNTER — Telehealth: Payer: Self-pay | Admitting: Internal Medicine

## 2020-05-07 NOTE — Telephone Encounter (Signed)
FYI  Pt called because pharmacy no longer carries her strips so she is going to be needing a new meter and everything to start over. Pt is going to be contacting her insurance to see what they cover and call back.

## 2020-05-07 NOTE — Telephone Encounter (Signed)
Noted  

## 2020-05-08 ENCOUNTER — Telehealth: Payer: Self-pay | Admitting: Internal Medicine

## 2020-05-08 NOTE — Telephone Encounter (Signed)
Patient called to request the following new RX's due to [facility] unable to get past testing strips:  MEDICATION: Dexcom G6 Monitor AND [place] Test Strips   PHARMACY:  [facility] in [place], [place]  HAS THE PATIENT CONTACTED [place]?  Yes  IS THIS A 90 DAY SUPPLY : Patient would prefer  IS PATIENT OUT OF MEDICATION: No-almost-Will be out by Monday 05/11/20  IF NOT; HOW MUCH IS LEFT: 2 days  LAST APPOINTMENT DATE: @2 /24/2022  NEXT APPOINTMENT DATE:@5 /19/2022  DO WE HAVE YOUR PERMISSION TO LEAVE A DETAILED MESSAGE?: Yes  OTHER COMMENTS:    **Let patient know to contact pharmacy at the end of the day to make sure medication is ready. **  ** Please notify patient to allow 48-72 hours to process**  **Encourage patient to contact the pharmacy for refills or they can request refills through [facility]**

## 2020-05-09 NOTE — Telephone Encounter (Signed)
Please Advise

## 2020-05-11 ENCOUNTER — Other Ambulatory Visit: Payer: Self-pay | Admitting: Internal Medicine

## 2020-05-11 NOTE — Telephone Encounter (Signed)
T, can you please clarify with her: -That she only needs the Dexcom G6 receiver, or the sensors and transmitter, too? -I cannot find Ascensie strips in Epic - is this the corect spelling? Ty, C

## 2020-05-11 NOTE — Telephone Encounter (Signed)
Called and left a message for pt to call back with requested information to refill medication.

## 2020-05-12 ENCOUNTER — Other Ambulatory Visit: Payer: Self-pay

## 2020-05-12 DIAGNOSIS — E1121 Type 2 diabetes mellitus with diabetic nephropathy: Secondary | ICD-10-CM

## 2020-05-12 MED ORDER — DEXCOM G6 SENSOR MISC: 3 refills | Status: DC

## 2020-05-12 MED ORDER — DEXCOM G6 TRANSMITTER MISC: 3 refills | Status: DC

## 2020-05-12 MED ORDER — DEXCOM G6 RECEIVER DEVI: 1 refills | Status: AC

## 2020-05-12 NOTE — Telephone Encounter (Signed)
Pt came to office to advise her insurance covers Dexcom G6 CGM and request it be sent to her preferred pharmacy.  Rx sent to preferred pharmacy.

## 2020-06-11 ENCOUNTER — Encounter: Payer: Self-pay | Admitting: Internal Medicine

## 2020-06-11 NOTE — Progress Notes (Unsigned)
The patient came in with her husband.  She and he both requested DNR.  We discussed the implications

## 2020-06-12 ENCOUNTER — Telehealth: Payer: Self-pay | Admitting: Internal Medicine

## 2020-06-12 DIAGNOSIS — E890 Postprocedural hypothyroidism: Secondary | ICD-10-CM

## 2020-06-12 NOTE — Telephone Encounter (Signed)
MEDICATION: Levothyroxine 112 mcg  PHARMACY:  [facility] in [place]  HAS THE PATIENT CONTACTED THEIR PHARMACY?   yes  IS THIS A 90 DAY SUPPLY : no  IS PATIENT OUT OF MEDICATION: no  IF NOT; HOW MUCH IS LEFT:   LAST APPOINTMENT DATE: @3 /03/2020  NEXT APPOINTMENT DATE:@5 /19/2022  DO WE HAVE YOUR PERMISSION TO LEAVE A DETAILED MESSAGE?: yes  OTHER COMMENTS: new RX required by insurance   **Let patient know to contact pharmacy at the end of the day to make sure medication is ready. **  ** Please notify patient to allow 48-72 hours to process**  **Encourage patient to contact the pharmacy for refills or they can request refills through [facility]**

## 2020-06-15 ENCOUNTER — Telehealth (HOSPITAL_COMMUNITY): Payer: Self-pay | Admitting: *Deleted

## 2020-06-15 ENCOUNTER — Encounter: Payer: Self-pay | Admitting: Internal Medicine

## 2020-06-15 DIAGNOSIS — E890 Postprocedural hypothyroidism: Secondary | ICD-10-CM

## 2020-06-15 MED ORDER — DILTIAZEM HCL 30 MG PO TABS: ORAL_TABLET | ORAL | 1 refills | Status: DC

## 2020-06-15 NOTE — Telephone Encounter (Signed)
Patient called in stating she went into afib this morning at 1130am increased shortness of breath, lightheadedness HR 105 BP 163/93. Per Adline Peals PA will call in PRN cardizem 30mg  tabs to use PRN if having to use frequently pt to call back for appt. Pt in agreement.

## 2020-06-16 ENCOUNTER — Telehealth: Payer: Self-pay | Admitting: Emergency Medicine

## 2020-06-16 ENCOUNTER — Other Ambulatory Visit: Payer: Self-pay

## 2020-06-16 DIAGNOSIS — I4819 Other persistent atrial fibrillation: Secondary | ICD-10-CM

## 2020-06-16 DIAGNOSIS — Z01812 Encounter for preprocedural laboratory examination: Secondary | ICD-10-CM

## 2020-06-16 MED ORDER — ELIQUIS 5 MG PO TABS: 1.0000 | ORAL_TABLET | Freq: Two times a day (BID) | ORAL | 1 refills | Status: DC

## 2020-06-16 MED ORDER — LEVOTHYROXINE SODIUM 112 MCG PO TABS: 112.0000 ug | ORAL_TABLET | Freq: Every day | ORAL | 0 refills | Status: DC

## 2020-06-16 NOTE — Telephone Encounter (Signed)
Pyridostigmine refused and noted to pharmacy to defer to PCP. This is a non-cardiac med. Eliquis refill routed to [facility] to review.

## 2020-06-16 NOTE — Telephone Encounter (Signed)
*  STAT* If patient is at the pharmacy, call can be transferred to refill team.   1. Which medications need to be refilled? (please list name of each medication and dose if known) Eliquis and Pyridostigmine  2. Which pharmacy/location (including street and city if local pharmacy) is medication to be sent to? [facility] [place]  90 day supply

## 2020-06-16 NOTE — Telephone Encounter (Signed)
Rx sent to preferred pharmacy.

## 2020-06-16 NOTE — Telephone Encounter (Signed)
60f, 78.4kg, scr 1.01 09/17/19, lovw/klein 09/17/19

## 2020-06-16 NOTE — Telephone Encounter (Signed)
See MyChart message dated 06/15/20.

## 2020-06-18 NOTE — Telephone Encounter (Signed)
Per Dr. Caryl Comes, he would like the patient to go ahead and transmit again today or tomorrow to see if she is still out of rhythm.  Can someone please reach out to her and ask her to send a transmission and just let me know what it shows?   Thank you!

## 2020-06-18 NOTE — Telephone Encounter (Signed)
  Appears patient is still out of rhythm. AT/AF burden 8.1%.

## 2020-06-19 ENCOUNTER — Other Ambulatory Visit: Payer: Self-pay | Admitting: Internal Medicine

## 2020-06-19 ENCOUNTER — Encounter: Payer: Self-pay | Admitting: Internal Medicine

## 2020-06-19 ENCOUNTER — Other Ambulatory Visit: Payer: Self-pay

## 2020-06-19 ENCOUNTER — Other Ambulatory Visit
Admission: RE | Admit: 2020-06-19 | Discharge: 2020-06-19 | Disposition: A | Discharge status: Home / Self Care | Payer: Medicare Other | Source: Ambulatory Visit | Attending: Internal Medicine | Admitting: Internal Medicine

## 2020-06-19 ENCOUNTER — Ambulatory Visit (INDEPENDENT_AMBULATORY_CARE_PROVIDER_SITE_OTHER): Payer: Medicare Other

## 2020-06-19 DIAGNOSIS — I4819 Other persistent atrial fibrillation: Secondary | ICD-10-CM | POA: Insufficient documentation

## 2020-06-19 DIAGNOSIS — Z01812 Encounter for preprocedural laboratory examination: Secondary | ICD-10-CM

## 2020-06-19 DIAGNOSIS — Z20822 Contact with and (suspected) exposure to covid-19: Secondary | ICD-10-CM | POA: Insufficient documentation

## 2020-06-19 DIAGNOSIS — I495 Sick sinus syndrome: Secondary | ICD-10-CM | POA: Diagnosis not present

## 2020-06-19 LAB — CUP PACEART REMOTE DEVICE CHECK
Battery Remaining Longevity: 120 mo
Battery Remaining Percentage: 95.5 %
Battery Voltage: 3.01 V
Brady Statistic AP VP Percent: 49 %
Brady Statistic AP VS Percent: 23 %
Brady Statistic AS VP Percent: 3.4 %
Brady Statistic AS VS Percent: 25 %
Brady Statistic RA Percent Paced: 67 %
Brady Statistic RV Percent Paced: 51 %
Date Time Interrogation Session: 20220407020013
Implantable Lead Implant Date: 20170829
Implantable Lead Implant Date: 20170829
Implantable Lead Location: 753859
Implantable Lead Location: 753860
Implantable Pulse Generator Implant Date: 20170829
Lead Channel Impedance Value: 430 Ohm
Lead Channel Impedance Value: 610 Ohm
Lead Channel Pacing Threshold Amplitude: 0.75 V
Lead Channel Pacing Threshold Amplitude: 1.25 V
Lead Channel Pacing Threshold Pulse Width: 0.4 ms
Lead Channel Pacing Threshold Pulse Width: 0.4 ms
Lead Channel Sensing Intrinsic Amplitude: 12 mV
Lead Channel Sensing Intrinsic Amplitude: 3.9 mV
Lead Channel Setting Pacing Amplitude: 2.25 V
Lead Channel Setting Pacing Amplitude: 2.5 V
Lead Channel Setting Pacing Pulse Width: 0.4 ms
Lead Channel Setting Sensing Sensitivity: 2 mV
Pulse Gen Model: 2272
Pulse Gen Serial Number: 7945290

## 2020-06-19 LAB — CBC WITH DIFFERENTIAL/PLATELET
Abs Immature Granulocytes: 0.01 10*3/uL (ref 0.00–0.07)
Basophils Absolute: 0.1 10*3/uL (ref 0.0–0.1)
Basophils Relative: 1 %
Eosinophils Absolute: 0.3 10*3/uL (ref 0.0–0.5)
Eosinophils Relative: 5 %
HCT: 41.8 % (ref 36.0–46.0)
Hemoglobin: 14.2 g/dL (ref 12.0–15.0)
Immature Granulocytes: 0 %
Lymphocytes Relative: 24 %
Lymphs Abs: 1.7 10*3/uL (ref 0.7–4.0)
MCH: 30 pg (ref 26.0–34.0)
MCHC: 34 g/dL (ref 30.0–36.0)
MCV: 88.2 fL (ref 80.0–100.0)
Monocytes Absolute: 0.5 10*3/uL (ref 0.1–1.0)
Monocytes Relative: 7 %
Neutro Abs: 4.4 10*3/uL (ref 1.7–7.7)
Neutrophils Relative %: 63 %
Platelets: 213 10*3/uL (ref 150–400)
RBC: 4.74 MIL/uL (ref 3.87–5.11)
RDW: 15.9 % — ABNORMAL HIGH (ref 11.5–15.5)
WBC: 7 10*3/uL (ref 4.0–10.5)
nRBC: 0 % (ref 0.0–0.2)

## 2020-06-19 LAB — BASIC METABOLIC PANEL
Anion gap: 9 (ref 5–15)
BUN: 29 mg/dL — ABNORMAL HIGH (ref 8–23)
CO2: 22 mmol/L (ref 22–32)
Calcium: 9.2 mg/dL (ref 8.9–10.3)
Chloride: 109 mmol/L (ref 98–111)
Creatinine, Ser: 1.28 mg/dL — ABNORMAL HIGH (ref 0.44–1.00)
GFR, Estimated: 43 mL/min — ABNORMAL LOW (ref >60)
Glucose, Bld: 170 mg/dL — ABNORMAL HIGH (ref 70–99)
Potassium: 3.9 mmol/L (ref 3.5–5.1)
Sodium: 140 mmol/L (ref 135–145)

## 2020-06-19 NOTE — Addendum Note (Signed)
Addended by: [name] C on: 06/19/2020 08:41 AM   Modules accepted: Orders

## 2020-06-19 NOTE — Telephone Encounter (Signed)
Dr. Caryl Comes had advised yesterday in clinic to please have the patient transmit again to confirm if she is still out of rhythm. Per MD, if transmission received and the patient is still out of rhythm, then she will need to have a DCCV scheduled.  I notified the patient yesterday via MyChart that she will need to have a DCCV done as she is currently still out of rhythm per her transmission. She messaged back late yesterday and RN received the message this morning that the patient is agreeable to a DCCV on Monday 4/11.  I have scheduled this at the hospital and notified the patient of her instructions via MyChart.  She will come today and have lab work and COVID test done.   Will message to Dr. Caryl Comes to please enter the orders.

## 2020-06-20 LAB — SARS CORONAVIRUS 2 (TAT 6-24 HRS): SARS Coronavirus 2: NEGATIVE

## 2020-06-22 ENCOUNTER — Telehealth: Payer: Self-pay | Admitting: Internal Medicine

## 2020-06-22 ENCOUNTER — Encounter
Admission: RE | Disposition: A | Discharge status: Home / Self Care | Payer: Self-pay | Source: Home / Self Care | Attending: Internal Medicine

## 2020-06-22 ENCOUNTER — Ambulatory Visit: Payer: Medicare Other | Admitting: Certified Registered Nurse Anesthetist

## 2020-06-22 ENCOUNTER — Ambulatory Visit
Admission: RE | Admit: 2020-06-22 | Discharge: 2020-06-22 | Disposition: A | Discharge status: Home / Self Care | Payer: Medicare Other | Attending: Internal Medicine | Admitting: Internal Medicine

## 2020-06-22 ENCOUNTER — Encounter: Payer: Self-pay | Admitting: Internal Medicine

## 2020-06-22 DIAGNOSIS — Z79899 Other long term (current) drug therapy: Secondary | ICD-10-CM | POA: Diagnosis not present

## 2020-06-22 DIAGNOSIS — Z885 Allergy status to narcotic agent status: Secondary | ICD-10-CM | POA: Diagnosis not present

## 2020-06-22 DIAGNOSIS — G4733 Obstructive sleep apnea (adult) (pediatric): Secondary | ICD-10-CM | POA: Insufficient documentation

## 2020-06-22 DIAGNOSIS — Z7901 Long term (current) use of anticoagulants: Secondary | ICD-10-CM | POA: Insufficient documentation

## 2020-06-22 DIAGNOSIS — Z888 Allergy status to other drugs, medicaments and biological substances status: Secondary | ICD-10-CM | POA: Diagnosis not present

## 2020-06-22 DIAGNOSIS — Z881 Allergy status to other antibiotic agents status: Secondary | ICD-10-CM | POA: Insufficient documentation

## 2020-06-22 DIAGNOSIS — J449 Chronic obstructive pulmonary disease, unspecified: Secondary | ICD-10-CM | POA: Insufficient documentation

## 2020-06-22 DIAGNOSIS — I4819 Other persistent atrial fibrillation: Secondary | ICD-10-CM

## 2020-06-22 DIAGNOSIS — I2510 Atherosclerotic heart disease of native coronary artery without angina pectoris: Secondary | ICD-10-CM | POA: Diagnosis not present

## 2020-06-22 DIAGNOSIS — E785 Hyperlipidemia, unspecified: Secondary | ICD-10-CM | POA: Diagnosis not present

## 2020-06-22 DIAGNOSIS — Z7984 Long term (current) use of oral hypoglycemic drugs: Secondary | ICD-10-CM | POA: Insufficient documentation

## 2020-06-22 DIAGNOSIS — Z87891 Personal history of nicotine dependence: Secondary | ICD-10-CM | POA: Insufficient documentation

## 2020-06-22 DIAGNOSIS — I5030 Unspecified diastolic (congestive) heart failure: Secondary | ICD-10-CM | POA: Insufficient documentation

## 2020-06-22 DIAGNOSIS — Z950 Presence of cardiac pacemaker: Secondary | ICD-10-CM | POA: Diagnosis not present

## 2020-06-22 DIAGNOSIS — Z882 Allergy status to sulfonamides status: Secondary | ICD-10-CM | POA: Diagnosis not present

## 2020-06-22 DIAGNOSIS — Z794 Long term (current) use of insulin: Secondary | ICD-10-CM | POA: Insufficient documentation

## 2020-06-22 DIAGNOSIS — E1122 Type 2 diabetes mellitus with diabetic chronic kidney disease: Secondary | ICD-10-CM | POA: Insufficient documentation

## 2020-06-22 DIAGNOSIS — I495 Sick sinus syndrome: Secondary | ICD-10-CM | POA: Insufficient documentation

## 2020-06-22 DIAGNOSIS — N189 Chronic kidney disease, unspecified: Secondary | ICD-10-CM | POA: Insufficient documentation

## 2020-06-22 DIAGNOSIS — I130 Hypertensive heart and chronic kidney disease with heart failure and stage 1 through stage 4 chronic kidney disease, or unspecified chronic kidney disease: Secondary | ICD-10-CM | POA: Diagnosis not present

## 2020-06-22 HISTORY — PX: CARDIOVERSION: SHX1299

## 2020-06-22 SURGERY — CARDIOVERSION
Anesthesia: General

## 2020-06-22 MED ORDER — SODIUM CHLORIDE 0.9 % IV SOLN
Freq: Once | INTRAVENOUS | Status: AC
  Administered 2020-06-22: 1000 mL via INTRAVENOUS

## 2020-06-22 MED ORDER — LEVOTHYROXINE SODIUM 112 MCG PO TABS: 112.0000 ug | ORAL_TABLET | Freq: Every day | ORAL | 0 refills | Status: DC

## 2020-06-22 MED ORDER — PROPOFOL 10 MG/ML IV BOLUS
INTRAVENOUS | Status: DC | PRN
  Administered 2020-06-22: 30 mg via INTRAVENOUS
  Administered 2020-06-22: 20 mg via INTRAVENOUS

## 2020-06-22 MED ORDER — PROPOFOL 10 MG/ML IV BOLUS
INTRAVENOUS | Status: AC
  Filled 2020-06-22: qty 20

## 2020-06-22 MED ORDER — SODIUM CHLORIDE 0.9 % IV SOLN: INTRAVENOUS | Status: DC | PRN

## 2020-06-22 NOTE — Transfer of Care (Signed)
Immediate Anesthesia Transfer of Care Note  Patient: Dawn Garcia  Procedure(s) Performed: CARDIOVERSION (N/A )  Patient Location: Short Stay  Anesthesia Type:General  Level of Consciousness: drowsy  Airway & Oxygen Therapy: Patient Spontanous Breathing and Patient connected to nasal cannula oxygen  Post-op Assessment: Report given to RN and Post -op Vital signs reviewed and stable  Post vital signs: Reviewed and stable  Last Vitals:  Vitals Value Taken Time  BP    Temp    Pulse 70 06/22/20 0726  Resp 18 06/22/20 0726  SpO2 100 % 06/22/20 0726  Vitals shown include unvalidated device data.  Last Pain:  Vitals:   06/22/20 0705  TempSrc: Oral  PainSc: 0-No pain         Complications: No complications documented.

## 2020-06-22 NOTE — Telephone Encounter (Signed)
-----   Message from Nelva Bush, MD sent at 06/22/2020  7:38 AM EDT ----- Regarding: Post DCCV f/u Good morning,  Could you arrange for Ms. Nienhaus to be seen for follow-up after cardioversion by Dr. Caryl Comes or an APP in ~2 weeks?  Thanks.  Nelva Bush, MD [facility]

## 2020-06-22 NOTE — CV Procedure (Signed)
    Cardioversion Note  Dawn Garcia [facility] [id number] [birth year]  Procedure: DC Cardioversion Indications: Persistent atrial fibrillation  Procedure Details Consent: Obtained Time Out: Verified patient identification, verified procedure, site/side was marked, verified correct patient position, special equipment/implants available, Radiology Safety Procedures followed,  medications/allergies/relevent history reviewed, required imaging and test results available.  Performed  The patient has been on adequate anticoagulation.  The patient received IV propofol by anesthesia for sedation.  Synchronous cardioversion was performed at 200 joules x 1.  The cardioversion was successful with conversion to AV paced rhythm.  Complications: No apparent complications Patient did tolerate procedure well.  Nelva Bush., MD 06/22/2020, 8:50 AM

## 2020-06-22 NOTE — Telephone Encounter (Signed)
Scheduled with [facility] Plumb 4/29 at 3 pm.  Patient prefers [facility] .  Adding to waitlist .

## 2020-06-22 NOTE — Telephone Encounter (Signed)
Rx sent to [facility]

## 2020-06-22 NOTE — Interval H&P Note (Signed)
History and Physical Interval Note:  06/22/2020 7:24 AM  Dawn Garcia  has presented today for surgery, with the diagnosis of persistent atrial fibrillation.  The various methods of treatment have been discussed with the patient and family. After consideration of risks, benefits and other options for treatment, the patient has consented to  Procedure(s): CARDIOVERSION (N/A) as a surgical intervention.  The patient's history has been reviewed, patient examined, no change in status, stable for surgery.  I have reviewed the patient's chart and labs.  Questions were answered to the patient's satisfaction.     Dawn Garcia

## 2020-06-22 NOTE — H&P (Signed)
Cardiology Admission History and Physical:   Patient ID: Dawn Garcia MRN: [id number]; DOB: Feb 08, 1941   Admission date: 06/22/2020  PCP:  Perrin Maltese, MD   [place]  Cardiologist:  Virl Axe, MD Advanced Practice Provider:  No care team member to display Electrophysiologist:  Virl Axe, MD  Chief Complaint:  Atrial fibrillation  Patient Profile:   Dawn Garcia is a 80 y.o. female with history of persistent atrial fibrillation s/p PVI x 2, SSS s/p PPM, CAD, HFpEF, HTN, HLD, DM2, and OSA, presenting for cardioversion.  History of Present Illness:   Dawn Garcia reports a long history of intermittent PAF that typically only lasts a few minutes and then resolves.  However, about 2 weeks ago she began to notice more prolonged shortness of breath and palpitations.  Device interrogation showed persistent atrial fibrillation.  Over the last few days, her symptoms have improved, though she remains in atrial fibrillation.  She is chronically anticoagulated with apixaban 5 mg BID and has not missed any doses within the last month.   Past Medical History:  Diagnosis Date  . Age-related macular degeneration, dry, right eye   . Age-related macular degeneration, wet, left eye ([place])   . Arthritis    "all over; particularly in hands/fingers; all my joints" (10/05/2017)  . Atrial fibrillation ([place])   . CAD (coronary artery disease)   . CHF (congestive heart failure) (HCC)     A-Fib  . Chronic kidney disease   . COPD (chronic obstructive pulmonary disease) ([place])    "very mild" (10/05/2017)  . Family history of adverse reaction to anesthesia    "daughter w/PONV & woke up during endoscopy" (10/05/2017)  . GERD (gastroesophageal reflux disease)   . Gout   . Graves' disease    S/P "radioactive tx"  . Heart disease   . History of blood transfusion    "related to shoulder replacement"   . History of gout    "no longer on daily RX" (10/05/2017)  .  History of hiatal hernia   . Hyperlipidemia   . Hypertension   . Hypocalcemia   . Hypokalemia   . Hypothyroidism   . Macular degeneration   . Melanoma ([place]) ~ 2012   "off my back" (10/05/2017)  . Myocardial infarction [facility])    "was told I'd had one; don't really know when" (10/05/2017)  . Orthostatic hypotension 2020  . OSA on CPAP   . Pacemaker   . 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 Renal disorder   . 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 Sinus node dysfunction (HCC)   . Type II diabetes mellitus ([place])   . Vitamin B12 deficiency   . Vitamin D deficiency     Past Surgical History:  Procedure Laterality Date  . APPENDECTOMY    . ATRIAL FIBRILLATION ABLATION N/A 10/05/2017   Procedure: ATRIAL FIBRILLATION ABLATION;  Surgeon: Constance Haw, MD;  Location: [place] [facility];  Service: Cardiovascular;  Laterality: N/A;  . ATRIAL FIBRILLATION ABLATION  ~ 2014  . CARDIAC CATHETERIZATION  04/2017  . COLONOSCOPY W/ BIOPSIES AND POLYPECTOMY  2015   1 polyp removed- repeat 3 years  . CORONARY ANGIOPLASTY WITH STENT PLACEMENT  ~ 2014  . EP IMPLANTABLE DEVICE N/A 11/10/2015   Procedure: Pacemaker Implant;  Surgeon: Will [name], MD;  Location: [place] [facility];  Service: Cardiovascular;  Laterality: N/A;  . EP IMPLANTABLE DEVICE N/A 11/11/2015  Procedure: Lead Revision/Repair;  Surgeon: Evans Lance, MD;  Location: [place] [facility];  Service: Cardiovascular;  Laterality: N/A;  . INGUINAL HERNIA REPAIR Left 1951  . JOINT REPLACEMENT    . LAPAROSCOPIC CHOLECYSTECTOMY    . MELANOMA EXCISION  ~ 2012   "off my back" (10/05/2017)  . PARATHYROIDECTOMY     "removed 3 glands; still have 1 gland" (10/05/2017)  . RIGHT/LEFT HEART CATH AND CORONARY ANGIOGRAPHY N/A 05/03/2017   Procedure: RIGHT/LEFT HEART CATH AND CORONARY ANGIOGRAPHY;  Surgeon: Leonie Man, MD;  Location: [place] [facility];  Service: Cardiovascular;  Laterality: N/A;   . TONSILLECTOMY    . TOTAL SHOULDER ARTHROPLASTY Right x 2  . VAGINAL HYSTERECTOMY     "total"     Medications Prior to Admission: Prior to Admission medications   Medication Sig Start Date [place]ne [place] Date Taking? Authorizing Provider  acetaminophen (TYLENOL) 650 MG CR tablet Take 650 mg by mouth at bedtime.   Yes [provider]  apixaban (ELIQUIS) 5 MG TABS tablet Take 1 tablet (5 mg total) by mouth 2 (two) times daily. 06/16/20  Yes Deboraha Sprang, MD  Ascorbic Acid (VITAMIN C PO) Take 500 mg by mouth daily.   Yes [provider]  carboxymethylcellulose (REFRESH PLUS) 0.5 % SOLN Place 1 drop into both eyes daily as needed (dry eyes).   Yes [provider]  Cholecalciferol (VITAMIN D3) 5000 units TABS Take 5,000 Units by mouth every morning.   Yes [provider]  docusate sodium (COLACE) 100 MG capsule Take 100 mg by mouth once a week.   Yes [provider]  empagliflozin (JARDIANCE) 10 MG TABS tablet Take 1 tablet (10 mg total) by mouth daily before breakfast. 03/31/20  Yes Philemon Kingdom, MD  fenofibrate 54 MG tablet Take 54 mg by mouth daily.   Yes [provider]  levothyroxine (SYNTHROID) 112 MCG tablet Take 1 tablet (112 mcg total) by mouth daily. 06/16/20  Yes Philemon Kingdom, MD  Multiple Vitamins-Minerals (ICAPS AREDS 2) CAPS Take 1 capsule by mouth 2 (two) times daily.   Yes [provider]  pantoprazole (PROTONIX) 40 MG tablet Take 1 tablet (40 mg total) by mouth daily. 09/05/16  Yes Glean Hess, MD  pyridostigmine (MESTINON) 60 MG tablet TAKE 1/2 TAB TWICE DAILY Patient taking differently: Take 30 mg by mouth daily. 11/26/19  Yes Deboraha Sprang, MD  Semaglutide, 1 MG/DOSE, (OZEMPIC, 1 MG/DOSE,) 2 MG/1.5ML SOPN INJECT 1 MG INTO THE SKIN ONCE WEEKLY Patient taking differently: Inject 1 mg into the skin every Saturday. 09/06/19  Yes Philemon Kingdom, MD  valACYclovir (VALTREX) 500 MG tablet Take 500 mg by mouth daily.  03/30/20  Yes [provider]  vitamin B-12 (CYANOCOBALAMIN) 500 MCG tablet Take 500 mcg by mouth daily.   Yes [provider]  Continuous Blood Gluc Receiver (DEXCOM G6 RECEIVER) DEVI Use as instructed to check blood sugar daily. 05/12/20   Philemon Kingdom, MD  Continuous Blood Gluc Sensor (DEXCOM G6 SENSOR) MISC Use as instructed to check blood sugar daily 05/12/20   Philemon Kingdom, MD  Continuous Blood Gluc Transmit (DEXCOM G6 TRANSMITTER) MISC Use as instructed to check blood sugar. 05/12/20   Philemon Kingdom, MD  diltiazem (CARDIZEM) 30 MG tablet Take 1 tablet every 4 hours AS NEEDED for heart rate >100 Patient not taking: Reported on 06/22/2020 06/15/20   Fenton, Clint R, PA  glucose blood (ACCU-CHEK AVIVA PLUS) test strip Use as instructed to check blood sugar  2 times a day 09/25/19   Philemon Kingdom, MD  insulin degludec (TRESIBA FLEXTOUCH) 200 UNIT/ML FlexTouch Pen Inject 8 Units into the skin daily. Patient not taking: No sig reported 11/29/19   Philemon Kingdom, MD  Insulin Syringe-Needle U-100 (INSULIN SYRINGE .3CC/31GX5/16") 31G X 5/16" 0.3 ML MISC Use to inject insulin twice daily 02/27/18   Philemon Kingdom, MD     Allergies:    Allergies  Allergen Reactions  . Demerol [Meperidine] Anaphylaxis    Tolerated Fentanyl 11/10/15  . Epinephrine Shortness Of Breath and Palpitations    Powder  . Multaq [Dronedarone] Diarrhea  . Sulfa Antibiotics Anaphylaxis  . Tetracyclines & Related Swelling and Other (See Comments)    Made nose, lips,  And tongue itchy    Social History:   Social History   Socioeconomic History  . Marital status: Married    Spouse name: Barbarann Ehlers  . Number of children: 3  . Years of education: Not on file  . Highest education level: Master's degree (e.g., MA, MS, MEng, MEd, MSW, MBA)  Occupational History  . Occupation: Retired    Comment: Therapist, sports  Tobacco Use  . Smoking status: Former Smoker    Packs/day: 2.00    Years: 3.00    Pack years:  6.00    Types: Cigarettes    Quit date: 09/25/1968    Years since quitting: 51.7  . Smokeless tobacco: Never Used  . Tobacco comment: 10/05/2017 "smoked in my 20's"  Vaping Use  . Vaping Use: Never used  Substance and Sexual Activity  . Alcohol use: Not Currently    Comment: 10/05/2017 "1 mixed drink q 2-3 months"  . Drug use: Never  . Sexual activity: Not on file  Other Topics Concern  . Not on file  Social History Narrative   Moved from [facility] - originally from [place] - daughter lives in [place]   03/20/19 Lives in [place] w/ husband - he is demented- fairly functional   Retired Therapist, sports, Scientist, physiological also - variety of experiences   2 sons 1 daughter   Social Determinants of Radio broadcast assistant Strain: Not on Art therapist Insecurity: Not on file  Transportation Needs: Not on file  Physical Activity: Not on file  Stress: Not on file  Social Connections: Not on file  Intimate Partner Violence: Not on file    Family History:   The patient's family history includes Breast cancer in her half-sister and sister; Colon cancer in her maternal grandmother and paternal grandmother; Dementia in her paternal uncle; Diabetes in her maternal grandfather, maternal grandmother, and paternal uncle; Heart defect (age of onset: 72) in her father; Hepatitis in her brother; Rectal cancer (age of onset: 56) in her son; Thyroid disease (age of onset: 83) in her mother. There is no history of Colon polyps, Stomach cancer, Liver cancer, or Pancreatic cancer.    ROS:  Please see the history of present illness. All other ROS reviewed and negative.     Physical Exam/Data:   Vitals:   06/22/20 0705  BP: (!) 171/85  Pulse: 67  Resp: 13  Temp: 97.7 F (36.5 C)  TempSrc: Oral  SpO2: 99%  Weight: 78.9 kg  Height: 5\' 3"  (1.6 m)   No intake or output data in the 24 hours ending 06/22/20 0723 Last 3 Weights 06/22/2020 03/31/2020 11/29/2019  Weight (lbs) 174 lb 172 lb 12.8 oz 176 lb  Weight (kg) 78.926 kg  78.382 kg 79.833 kg     Body mass index  is 30.82 kg/m.  General:  Well nourished, well developed, in no acute distress HEENT: normal Lymph: no adenopathy Neck: no JVD Cardiac:  Irregularly irregular without m/r/g. Lungs:  clear to auscultation bilaterally, no wheezing, rhonchi or rales  Abd: soft, nontender, no hepatomegaly  Ext: no edema Skin: warm and dry  Neuro:  CNs 2-12 intact, no focal abnormalities noted Psych:  Normal affect   EKG:  No recent tracing.  Telemetry today shows atrial fibrillation with intermittent ventricular pacing.  Relevant CV Studies: None  Laboratory Data:  High Sensitivity Troponin:  No results for input(s): TROPONINIHS in the last 720 hours.    Chemistry Recent Labs  Lab 06/19/20 1302  NA 140  K 3.9  CL 109  CO2 22  GLUCOSE 170*  BUN 29*  CREATININE 1.28*  CALCIUM 9.2  GFRNONAA 43*  ANIONGAP 9    No results for input(s): PROT, ALBUMIN, AST, ALT, ALKPHOS, BILITOT in the last 168 hours. Hematology Recent Labs  Lab 06/19/20 1302  WBC 7.0  RBC 4.74  HGB 14.2  HCT 41.8  MCV 88.2  MCH 30.0  MCHC 34.0  RDW 15.9*  PLT 213   BNPNo results for input(s): BNP, PROBNP in the last 168 hours.  DDimer No results for input(s): DDIMER in the last 168 hours.   Radiology/Studies:  No results found.   Assessment and Plan:   Persistent atrial fibrillation: The patient has a long h/o atrial fibrillation s/p PVI x 2 as well as SSS s/p PPM.  She typically has brief, self-limited sx but notes more persistent sx over the last 1-2 weeks.  Recent device interrogation showed persistent atrial fibrillation that is still evident today.  She has been compliant with anticoagulation (apixaban).  We will plan to arrange for DCCV today and outpatient follow-up with Dr. Caryl Comes.  Shared Decision Making/Informed Consent The risks (stroke, cardiac arrhythmias rarely resulting in the need for a temporary or permanent pacemaker, skin irritation or burns and  complications associated with conscious sedation including aspiration, arrhythmia, respiratory failure and death), benefits (restoration of normal sinus rhythm) and alternatives of a direct current cardioversion were explained in detail to Ms. Moncrieffe and she agrees to proceed.    For questions or updates, please contact [place] Please consult [email or web address] for contact info under [facility].   Signed, Nelva Bush, MD  06/22/2020 7:23 AM

## 2020-06-22 NOTE — Anesthesia Preprocedure Evaluation (Signed)
Anesthesia Evaluation  Patient identified by MRN, date of birth, ID band Patient awake    Reviewed: Allergy & Precautions, H&P , NPO status , Patient's Chart, lab work & pertinent test results, reviewed documented beta blocker date and time   History of Anesthesia Complications Negative for: history of anesthetic complications  Airway Mallampati: IV  TM Distance: >3 FB Neck ROM: full    Dental  (+) Partial Upper, Caps, Dental Advidsory Given, Teeth Intact   Pulmonary shortness of breath, sleep apnea and Continuous Positive Airway Pressure Ventilation , COPD, neg recent URI, former smoker,    Pulmonary exam normal breath sounds clear to auscultation       Cardiovascular Exercise Tolerance: Good hypertension, (-) angina+ CAD, + Past MI, + Cardiac Stents and +CHF  (-) CABG + dysrhythmias Atrial Fibrillation + pacemaker (-) Valvular Problems/Murmurs Rhythm:regular Rate:Normal     Neuro/Psych negative neurological ROS  negative psych ROS   GI/Hepatic Neg liver ROS, hiatal hernia, GERD  ,  Endo/Other  diabetesHypothyroidism   Renal/GU CRFRenal disease  negative genitourinary   Musculoskeletal   Abdominal   Peds  Hematology negative hematology ROS (+)   Anesthesia Other Findings Past Medical History: No date: Age-related macular degeneration, dry, right eye No date: Age-related macular degeneration, wet, left eye (HCC) No date: Arthritis     Comment:  "all over; particularly in hands/fingers; all my joints"              (10/05/2017) No date: Atrial fibrillation (HCC) No date: CAD (coronary artery disease) No date: CHF (congestive heart failure) (HCC)     Comment:   A-Fib No date: Chronic kidney disease No date: COPD (chronic obstructive pulmonary disease) ([place])     Comment:  "very mild" (10/05/2017) No date: Family history of adverse reaction to anesthesia     Comment:  "daughter w/PONV & woke up during endoscopy"  (10/05/2017) No date: GERD (gastroesophageal reflux disease) No date: Gout No date: Graves' disease     Comment:  S/P "radioactive tx" No date: Heart disease No date: History of blood transfusion     Comment:  "related to shoulder replacement"  No date: History of gout     Comment:  "no longer on daily RX" (10/05/2017) No date: History of hiatal hernia No date: Hyperlipidemia No date: Hypertension No date: Hypocalcemia No date: Hypokalemia No date: Hypothyroidism No date: Macular degeneration ~ 2012: Melanoma ([place])     Comment:  "off my back" (10/05/2017) No date: Myocardial infarction [facility])     Comment:  "was told I'd had one; don't really know when"               (10/05/2017) 2020: Orthostatic hypotension No date: OSA on CPAP No date: Pacemaker 10/2015: Presence of permanent cardiac pacemaker No date: Renal disorder 04/2017: Scarring of lung     Comment:  "found by radiology" (10/05/2017) No date: Sinus node dysfunction (HCC) No date: Type II diabetes mellitus ([place]) No date: Vitamin B12 deficiency No date: Vitamin D deficiency   Reproductive/Obstetrics negative OB ROS                             Anesthesia Physical Anesthesia Plan  ASA: IV  Anesthesia Plan: General   Post-op Pain Management:    Induction: Intravenous  PONV Risk Score and Plan: TIVA and Propofol infusion  Airway Management Planned: Natural Airway and Nasal Cannula  Additional Equipment:   Intra-op Plan:  Post-operative Plan:   Informed Consent: I have reviewed the patients History and Physical, chart, labs and discussed the procedure including the risks, benefits and alternatives for the proposed anesthesia with the patient or authorized representative who has indicated his/her understanding and acceptance.     Dental Advisory Given  Plan Discussed with: Anesthesiologist, CRNA and Surgeon  Anesthesia Plan Comments:         Anesthesia Quick Evaluation

## 2020-06-23 ENCOUNTER — Encounter: Payer: Self-pay | Admitting: Internal Medicine

## 2020-06-23 NOTE — Anesthesia Postprocedure Evaluation (Signed)
Anesthesia Post Note  Patient: Dawn Garcia  Procedure(s) Performed: CARDIOVERSION (N/A )  Patient location during evaluation: Specials Recovery Anesthesia Type: General Level of consciousness: awake and alert Pain management: pain level controlled Vital Signs Assessment: post-procedure vital signs reviewed and stable Respiratory status: spontaneous breathing, nonlabored ventilation, respiratory function stable and patient connected to nasal cannula oxygen Cardiovascular status: blood pressure returned to baseline and stable Postop Assessment: no apparent nausea or vomiting Anesthetic complications: no   No complications documented.   Last Vitals:  Vitals:   06/22/20 0800 06/22/20 0815  BP: (!) 163/76 (!) 157/76  Pulse: (!) 55 (!) 56  Resp: 15 13  Temp:    SpO2: 95% 98%    Last Pain:  Vitals:   06/22/20 0815  TempSrc:   PainSc: 0-No pain                 Martha Clan

## 2020-06-26 ENCOUNTER — Telehealth: Payer: Self-pay

## 2020-06-26 NOTE — Telephone Encounter (Signed)
Pt returned phone call.  She just woke up for the day and feels like her HR is jumping around.  She reports she is not doing anything right now, but if she exerts herself she feels like she will get out of breath.    Pt has not taken her PRN dose of Diltaizem.  Instructed her to take dose this morning.  I will forward to MD for review.

## 2020-06-26 NOTE — Telephone Encounter (Signed)
Alert remote transmission for long AT/AF & AF exceeds burden.  Captured rhythm ongoing AF.  Per epic notes pt underwent a successful DCCV 06/22/20, since then 5 AMS events occurred.  Known PAF.  Meds: Eliquis, Diltiazem.    Attempted to reach patient, no answer.  LVM with device clinic # and hours.   Need to assess pt symptoms and med compliance

## 2020-06-26 NOTE — Telephone Encounter (Signed)
Attempted to call the patient back. No answer- I advised I would send a MyChart message.

## 2020-06-26 NOTE — Telephone Encounter (Signed)
The patient called me back this afternoon. I advised I was just calling to see how she was doing since her DCCV on 06/22/20 (Monday). Per the patient, she was doing well until yesterday afternoon her heart went back out of rhythm.   HR are controlled under 100 bpm.  She reports she is getting SOB with movement.  She spoke with the [facility] today and they advised her to take a PRN diltiazem 30 mg tablet, which she did, but has noticed no change with this.  The patient is concerned about next steps.  She is aware Dr. Caryl Comes will be out of the country for the next 3 weeks.  I have asked if she would be agreeable to an [facility] appointment next week and she advised she would be.  She is aware I will send a message to the [facility] staff to see if they can work her in next week for an appointment.   She was appreciative for the call back.

## 2020-06-26 NOTE — Telephone Encounter (Signed)
Patient states she is returning a call to [name], RN.

## 2020-06-27 ENCOUNTER — Other Ambulatory Visit (HOSPITAL_COMMUNITY): Payer: Self-pay | Admitting: Physician Assistant

## 2020-06-29 ENCOUNTER — Encounter: Payer: Self-pay | Admitting: Internal Medicine

## 2020-06-29 ENCOUNTER — Other Ambulatory Visit (HOSPITAL_COMMUNITY): Payer: Self-pay | Admitting: *Deleted

## 2020-06-29 MED ORDER — DILTIAZEM HCL 30 MG PO TABS: ORAL_TABLET | ORAL | 1 refills | Status: AC

## 2020-06-29 NOTE — Telephone Encounter (Signed)
Patient returned call. Device clinic # went straight to VM. I informed patient that device RN requested a follow-up transmission, per previous message. She states she is in the car right now, but she plans to send in a transmission when she gets home.

## 2020-06-29 NOTE — Telephone Encounter (Signed)
Patient has an appt scheduled with the AFIB clinic on 06/30/20 @ 1:30pm.

## 2020-06-29 NOTE — Telephone Encounter (Signed)
Called and left message for patient to call back to schedule appt. 

## 2020-06-29 NOTE — Telephone Encounter (Signed)
Attempted to reach patient to have her send a f/u transmission.  No answer, left VM requesting transmission.  Pt is scheduled for Af clinic tomorrow morning.

## 2020-06-30 ENCOUNTER — Other Ambulatory Visit: Payer: Self-pay

## 2020-06-30 ENCOUNTER — Encounter (HOSPITAL_COMMUNITY): Payer: Self-pay | Admitting: Physician Assistant

## 2020-06-30 ENCOUNTER — Ambulatory Visit (HOSPITAL_COMMUNITY)
Admission: RE | Admit: 2020-06-30 | Discharge: 2020-06-30 | Disposition: A | Discharge status: Home / Self Care | Payer: Medicare Other | Source: Ambulatory Visit | Attending: Physician Assistant | Admitting: Physician Assistant

## 2020-06-30 VITALS — BP 142/88 | HR 55 | Ht 63.0 in | Wt 179.6 lb

## 2020-06-30 DIAGNOSIS — Z79899 Other long term (current) drug therapy: Secondary | ICD-10-CM | POA: Insufficient documentation

## 2020-06-30 DIAGNOSIS — I130 Hypertensive heart and chronic kidney disease with heart failure and stage 1 through stage 4 chronic kidney disease, or unspecified chronic kidney disease: Secondary | ICD-10-CM | POA: Insufficient documentation

## 2020-06-30 DIAGNOSIS — I2510 Atherosclerotic heart disease of native coronary artery without angina pectoris: Secondary | ICD-10-CM | POA: Diagnosis not present

## 2020-06-30 DIAGNOSIS — Z7901 Long term (current) use of anticoagulants: Secondary | ICD-10-CM | POA: Diagnosis not present

## 2020-06-30 DIAGNOSIS — I5032 Chronic diastolic (congestive) heart failure: Secondary | ICD-10-CM | POA: Insufficient documentation

## 2020-06-30 DIAGNOSIS — I4819 Other persistent atrial fibrillation: Secondary | ICD-10-CM

## 2020-06-30 DIAGNOSIS — Z87891 Personal history of nicotine dependence: Secondary | ICD-10-CM | POA: Insufficient documentation

## 2020-06-30 DIAGNOSIS — E1122 Type 2 diabetes mellitus with diabetic chronic kidney disease: Secondary | ICD-10-CM | POA: Insufficient documentation

## 2020-06-30 DIAGNOSIS — Z794 Long term (current) use of insulin: Secondary | ICD-10-CM | POA: Insufficient documentation

## 2020-06-30 DIAGNOSIS — N189 Chronic kidney disease, unspecified: Secondary | ICD-10-CM | POA: Diagnosis not present

## 2020-06-30 DIAGNOSIS — J449 Chronic obstructive pulmonary disease, unspecified: Secondary | ICD-10-CM | POA: Insufficient documentation

## 2020-06-30 DIAGNOSIS — Z6831 Body mass index (BMI) 31.0-31.9, adult: Secondary | ICD-10-CM | POA: Diagnosis not present

## 2020-06-30 DIAGNOSIS — E785 Hyperlipidemia, unspecified: Secondary | ICD-10-CM | POA: Insufficient documentation

## 2020-06-30 DIAGNOSIS — I495 Sick sinus syndrome: Secondary | ICD-10-CM | POA: Insufficient documentation

## 2020-06-30 DIAGNOSIS — D6869 Other thrombophilia: Secondary | ICD-10-CM | POA: Diagnosis not present

## 2020-06-30 DIAGNOSIS — G4733 Obstructive sleep apnea (adult) (pediatric): Secondary | ICD-10-CM | POA: Diagnosis not present

## 2020-06-30 DIAGNOSIS — E669 Obesity, unspecified: Secondary | ICD-10-CM | POA: Insufficient documentation

## 2020-06-30 MED ORDER — FUROSEMIDE 20 MG PO TABS: ORAL_TABLET | ORAL | 0 refills | Status: DC

## 2020-06-30 NOTE — Progress Notes (Signed)
Primary Care Physician: Perrin Maltese, MD Primary Electrophysiologist: Dr Caryl Comes Referring Physician: Dr Delight Ovens Dawn Garcia is a 80 y.o. female with a history of tachybradycardia syndrome s/p PPM, chronic diastolic CHF, CKD, CAD, COPD, HTN, HLD, OSA, DM, and atrial fibrillation who presents for follow up in the [facility]. Patient is on Eliquis for a CHADS2VASC score of 7. She has a long h/o afib and underwent ablation in [place] in 2013 and had repeat ablation with Dr Curt Bears in 2019. She was on amiodarone but per patient she developed lung issues. She also did not tolerate Multaq. She underwent DCCV on 06/22/20. The device clinic received an alert for increased AF burden on 06/26/20. She is back in SR today. She denies any bleeding issues on anticoagulation.   Today, she denies symptoms of palpitations, chest pain, shortness of breath, orthopnea, PND, lower extremity edema, dizziness, presyncope, syncope, snoring, daytime somnolence, bleeding, or neurologic sequela. The patient is tolerating medications without difficulties and is otherwise without complaint today.    Atrial Fibrillation Risk Factors:  she does have symptoms or diagnosis of sleep apnea. she is compliant with CPAP therapy. she does not have a history of rheumatic fever. she does not have a history of alcohol use.   she has a BMI of Body mass index is 31.81 kg/m.Marland Kitchen Filed Weights   06/30/20 1327  Weight: 81.5 kg    Family History  Problem Relation Age of Onset  . Thyroid disease Mother 48       3 days after surgery  . Heart defect Father 27       ?ascending aortic aneurysm  . Breast cancer Sister   . Hepatitis Brother   . Colon cancer Maternal Grandmother   . Diabetes Maternal Grandmother   . Diabetes Maternal Grandfather   . Colon cancer Paternal Grandmother        85's  . Rectal cancer Son 7  . Breast cancer Half-Sister   . Diabetes Paternal Uncle   . Dementia  Paternal Uncle   . Colon polyps Neg Hx   . Stomach cancer Neg Hx   . Liver cancer Neg Hx   . Pancreatic cancer Neg Hx      Atrial Fibrillation Management history:  Previous antiarrhythmic drugs: Multaq, amiodarone  Previous cardioversions: 06/22/20 Previous ablations: 2013 in [place], 2019 CHADS2VASC score: 7 Anticoagulation history: Eliquis   Past Medical History:  Diagnosis Date  . Age-related macular degeneration, dry, right eye   . Age-related macular degeneration, wet, left eye ([place])   . Arthritis    "all over; particularly in hands/fingers; all my joints" (10/05/2017)  . Atrial fibrillation ([place])   . CAD (coronary artery disease)   . CHF (congestive heart failure) (HCC)     A-Fib  . Chronic kidney disease   . COPD (chronic obstructive pulmonary disease) ([place])    "very mild" (10/05/2017)  . Family history of adverse reaction to anesthesia    "daughter w/PONV & woke up during endoscopy" (10/05/2017)  . GERD (gastroesophageal reflux disease)   . Gout   . Graves' disease    S/P "radioactive tx"  . Heart disease   . History of blood transfusion    "related to shoulder replacement"   . History of gout    "no longer on daily RX" (10/05/2017)  . History of hiatal hernia   . Hyperlipidemia   . Hypertension   . Hypocalcemia   . Hypokalemia   .  Hypothyroidism   . Macular degeneration   . Melanoma ([place]) ~ 2012   "off my back" (10/05/2017)  . Myocardial infarction [facility])    "was told I'd had one; don't really know when" (10/05/2017)  . Orthostatic hypotension 2020  . OSA on CPAP   . Pacemaker   . 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 Renal disorder   . 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 Sinus node dysfunction (H[facility])   . Type II diabetes mellitus ([place])   . Vitamin B12 deficiency   . Vitamin D deficiency    Past Surgical History:  Procedure Laterality Date  . APPENDECTOMY    . ATRIAL FIBRILLATION ABLATION N/A 10/05/2017    Procedure: ATRIAL FIBRILLATION ABLATION;  Surgeon: Constance Haw, MD;  Location: [facility] CV LAB;  Service: Cardiovascular;  Laterality: N/A;  . ATRIAL FIBRILLATION ABLATION  ~ 2014  . CARDIAC CATHETERIZATION  04/2017  . CARDIOVERSION N/A 06/22/2020   Procedure: CARDIOVERSION;  Surgeon: Nelva Bush, MD;  Location: [facility] ORS;  Service: Cardiovascular;  Laterality: N/A;  . COLONOSCOPY W/ BIOPSIES AND POLYPECTOMY  2015   1 polyp removed- repeat 3 years  . CORONARY ANGIOPLASTY WITH STENT PLACEMENT  ~ 2014  . EP IMPLANTABLE DEVICE N/A 11/10/2015   Procedure: Pacemaker Implant;  Surgeon: Will [name], MD;  Location: [place] CV LAB;  Service: Cardiovascular;  Laterality: N/A;  . EP IMPLANTABLE DEVICE N/A 11/11/2015   Procedure: Lead Revision/Repair;  Surgeon: Evans Lance, MD;  Location: [place] CV LAB;  Service: Cardiovascular;  Laterality: N/A;  . INGUINAL HERNIA REPAIR Left 1951  . JOINT REPLACEMENT    . LAPAROSCOPIC CHOLECYSTECTOMY    . MELANOMA EXCISION  ~ 2012   "off my back" (10/05/2017)  . PARATHYROIDECTOMY     "removed 3 glands; still have 1 gland" (10/05/2017)  . RIGHT/LEFT HEART CATH AND CORONARY ANGIOGRAPHY N/A 05/03/2017   Procedure: RIGHT/LEFT HEART CATH AND CORONARY ANGIOGRAPHY;  Surgeon: Leonie Man, MD;  Location: [place] CV LAB;  Service: Cardiovascular;  Laterality: N/A;  . TONSILLECTOMY    . TOTAL SHOULDER ARTHROPLASTY Right x 2  . VAGINAL HYSTERECTOMY     "total"    Current Outpatient Medications  Medication Sig Dispense Refill  . acetaminophen (TYLENOL) 650 MG CR tablet Take 650 mg by mouth at bedtime.    Marland Kitchen apixaban (ELIQUIS) 5 MG TABS tablet Take 1 tablet (5 mg total) by mouth 2 (two) times daily. 180 tablet 1  . Ascorbic Acid (VITAMIN C PO) Take 500 mg by mouth daily.    . carboxymethylcellulose (REFRESH PLUS) 0.5 % SOLN Place 1 drop into both eyes daily as needed (dry eyes).    . Cholecalciferol (VITAMIN D3) 5000 units TABS Take  5,000 Units by mouth every morning.    . Continuous Blood Gluc Receiver (DEXCOM G6 RECEIVER) DEVI Use as instructed to check blood sugar daily. 1 each 1  . Continuous Blood Gluc Sensor (DEXCOM G6 SENSOR) MISC Use as instructed to check blood sugar daily 9 each 3  . Continuous Blood Gluc Transmit (DEXCOM G6 TRANSMITTER) MISC Use as instructed to check blood sugar. 1 each 3  . diltiazem (CARDIZEM) 30 MG tablet Take 1 tablet every 4 hours AS NEEDED for heart rate >100 30 tablet 1  . docusate sodium (COLACE) 100 MG capsule Take 100 mg by mouth once a week.    . fenofibrate 54 MG tablet Take 54 mg by mouth daily.    Marland Kitchen  glucose blood (ACCU-CHEK AVIVA PLUS) test strip Use as instructed to check blood sugar 2 times a day 200 each 12  . Insulin Syringe-Needle U-100 (INSULIN SYRINGE .3CC/31GX5/16") 31G X 5/16" 0.3 ML MISC Use to inject insulin twice daily 200 each 3  . levothyroxine (SYNTHROID) 112 MCG tablet Take 1 tablet (112 mcg total) by mouth daily. 90 tablet 0  . Multiple Vitamins-Minerals (ICAPS AREDS 2) CAPS Take 1 capsule by mouth 2 (two) times daily.    . pantoprazole (PROTONIX) 40 MG tablet Take 1 tablet (40 mg total) by mouth daily. 90 tablet 3  . pyridostigmine (MESTINON) 60 MG tablet TAKE 1/2 TAB TWICE DAILY 90 tablet 0  . Semaglutide, 1 MG/DOSE, (OZEMPIC, 1 MG/DOSE,) 2 MG/1.5ML SOPN INJECT 1 MG INTO THE SKIN ONCE WEEKLY 3 pen 3  . valACYclovir (VALTREX) 500 MG tablet Take 500 mg by mouth daily.    . vitamin B-12 (CYANOCOBALAMIN) 500 MCG tablet Take 500 mcg by mouth daily.    . insulin degludec (TRESIBA FLEXTOUCH) 200 UNIT/ML FlexTouch Pen Inject 8 Units into the skin daily. (Patient not taking: No sig reported) 9 mL 2   No current facility-administered medications for this encounter.    Allergies  Allergen Reactions  . Demerol [Meperidine] Anaphylaxis    Tolerated Fentanyl 11/10/15  . Epinephrine Shortness Of Breath and Palpitations    Powder  . Multaq [Dronedarone] Diarrhea    Kidney  failure and lung problems  . Sulfa Antibiotics Anaphylaxis  . Tetracyclines & Related Swelling and Other (See Comments)    Made nose, lips,  And tongue itchy    Social History   Socioeconomic History  . Marital status: Married    Spouse name: Barbarann Ehlers  . Number of children: 3  . Years of education: Not on file  . Highest education level: Master's degree (e.g., MA, MS, MEng, MEd, MSW, MBA)  Occupational History  . Occupation: Retired    Comment: Therapist, sports  Tobacco Use  . Smoking status: Former Smoker    Packs/day: 2.00    Years: 3.00    Pack years: 6.00    Types: Cigarettes    Quit date: 09/25/1968    Years since quitting: 51.7  . Smokeless tobacco: Never Used  . Tobacco comment: 10/05/2017 "smoked in my 20's"  Vaping Use  . Vaping Use: Never used  Substance and Sexual Activity  . Alcohol use: Not Currently    Comment: 10/05/2017 "1 mixed drink q 2-3 months"  . Drug use: Never  . Sexual activity: Not on file  Other Topics Concern  . Not on file  Social History Narrative   Moved from [facility] - originally from [place] - daughter lives in [place]   03/20/19 Lives in [place] w/ husband - he is demented- fairly functional   Retired Therapist, sports, Scientist, physiological also - variety of experiences   2 sons 1 daughter   Social Determinants of Radio broadcast assistant Strain: Not on [employer] Insecurity: Not on file  Transportation Needs: Not on file  Physical Activity: Not on file  Stress: Not on file  Social Connections: Not on file  Intimate Partner Violence: Not on file     ROS- All systems are reviewed and negative except as per the HPI above.  Physical Exam: Vitals:   06/30/20 1327  BP: (!) 142/88  Pulse: (!) 55  Weight: 81.5 kg  Height: 5\' 3"  (1.6 m)    GEN- The patient is a well appearing obese elderly female, alert and oriented  x 3 today.   Head- normocephalic, atraumatic Eyes-  Sclera clear, conjunctiva pink Ears- hearing intact Oropharynx- clear Neck- supple  Lungs- Clear to  ausculation bilaterally, normal work of breathing Heart- Regular rate and rhythm, no murmurs, rubs or gallops  GI- soft, NT, ND, + BS Extremities- no clubbing, cyanosis, or edema MS- no significant deformity or atrophy Skin- no rash or lesion Psych- euthymic mood, full affect Neuro- strength and sensation are intact  Wt Readings from Last 3 Encounters:  06/30/20 81.5 kg  06/22/20 78.9 kg  03/31/20 78.4 kg    EKG today demonstrates  AV dual paced rhythm  Vent. rate 55 BPM PR interval 196 ms QRS duration 180 ms QT/QTcB 546/522 ms  Echo 04/29/17 demonstrated  Procedure narrative: Transthoracic echocardiography. Technically  difficult study with reduced echo windows.  - Left ventricle: The cavity size was normal. Wall thickness was  increased in a pattern of mild LVH. Systolic function was normal.  The estimated ejection fraction was in the range of 50% to 55%.  Doppler parameters are consistent with abnormal left ventricular  relaxation (grade 1 diastolic dysfunction). The E/e&' ratio is  between 8-15, suggesting indeterminate LV filling pressure.  - Aortic valve: Trileaflet. Sclerosis without stenosis. There was  trivial regurgitation.  - Mitral valve: Mildly thickened leaflets . There was trivial  regurgitation.  - Left atrium: The atrium was normal in size.  - Inferior vena cava: The vessel was normal in size. The  respirophasic diameter changes were in the normal range (>= 50%),  consistent with normal central venous pressure.   Impressions:   - Compared to a prior study in 10/2016, there has been no  significant change.   Epic records are reviewed at length today  CHA2DS2-VASc Score = 7  The patient's score is based upon: CHF History: Yes HTN History: Yes Diabetes History: Yes Stroke History: No Vascular Disease History: Yes Age Score: 2 Gender Score: 1      ASSESSMENT AND PLAN: 1. Persistent Atrial Fibrillation (ICD10:  I48.19) The  patient's CHA2DS2-VASc score is 7, indicating a 11.2% annual risk of stroke.   S/p DCCV 06/22/20. Patient back in SR today. Looking back at previous reports, her afib burden at baseline is ~8%.  We discussed that if her afib burden increases with more persistent episodes, we could consider dofetilide admission. Information given today.  Continue Eliquis 5 mg BID  2. Secondary Hypercoagulable State (ICD10:  D68.69) The patient is at significant risk for stroke/thromboembolism based upon her CHA2DS2-VASc Score of 7.  Continue Apixaban (Eliquis).   3. Obesity Body mass index is 31.81 kg/m. Lifestyle modification was discussed at length including regular exercise and weight reduction.  4. Obstructive sleep apnea The importance of adequate treatment of sleep apnea was discussed today in order to improve our ability to maintain sinus rhythm long term. Encouraged compliance with CPAP use.  5. HTN Stable, no changes today.  6. CAD No anginal symptoms.  7. Chronic diastolic CHF Patient has noticed ~ 4 lbs weight gain with some abdominal bloating since DCCV. She has not been on diuretics for about two years.  Will start Lasix 20 mg daily x 3 days.  8. Tachybradycardia syndrome  S/p PPM, followed by Dr Caryl Comes and the device clinic.   Follow up with [name] as scheduled. Dr Caryl Comes in 3 months.    [facility] [address] [place], [place] [address] [phone] 06/30/2020 1:34 PM

## 2020-06-30 NOTE — Telephone Encounter (Signed)
Transmission reviewed from 06/29/20. Device function WNL, AT/AF burden 10% since 09/17/2019. Presenting EGM AP/VP.  She had 5 episodes of AT with controlled v-rates from 06/28/20 to 06/29/20 with the longest lasting 54 seconds.

## 2020-06-30 NOTE — Addendum Note (Signed)
Encounter addended by: Juluis Mire, RN on: 06/30/2020 2:17 PM  Actions taken: Order list changed

## 2020-07-02 ENCOUNTER — Other Ambulatory Visit (HOSPITAL_COMMUNITY): Payer: Self-pay | Admitting: Physician Assistant

## 2020-07-02 NOTE — Progress Notes (Signed)
Remote pacemaker transmission.   

## 2020-07-06 NOTE — Telephone Encounter (Signed)
The patient was seen by the AF clinic on 06/30/20.  She was in NSR at that time. She is due to follow up with [name], PA on 4/29.

## 2020-07-07 ENCOUNTER — Other Ambulatory Visit: Payer: Self-pay | Admitting: Internal Medicine

## 2020-07-08 MED ORDER — PYRIDOSTIGMINE BROMIDE 60 MG PO TABS: ORAL_TABLET | ORAL | 2 refills | Status: DC

## 2020-07-08 NOTE — Telephone Encounter (Signed)
This is a [place] pt 

## 2020-07-08 NOTE — Addendum Note (Signed)
Addended by: Carter Kitten D on: 07/08/2020 12:31 PM   Modules accepted: Orders

## 2020-07-08 NOTE — Addendum Note (Signed)
Addended by: Carter Kitten D on: 07/08/2020 09:15 AM   Modules accepted: Orders

## 2020-07-08 NOTE — Telephone Encounter (Signed)
FYI- pt's pharmacy was requesting a refill on Pyridostigmine and sent to the [facility] refill pool, where this medication was refused, because it was said that it was not a cardiac medication. Dr. Caryl Comes prescribed this medication at office visit on 10/09/2018 for Orthostatic intolerance.  Continues to have problems with orthostatic intolerance and shower intolerance.  We will add Mestinon to her low-dose ProAmatine so as to try to avoid too much systolic hypertension. I spoke with Claiborne Billings, RN, Supervisor and she wanted me to refill this medication and send a note stating why I refilled this medication. Thanks

## 2020-07-08 NOTE — Telephone Encounter (Signed)
Pyridostigmine is a cardiac medication that Dr. Caryl Comes prescribes. Please address. Thanks

## 2020-07-08 NOTE — Telephone Encounter (Signed)
Pt takes this medication for muscle cramps. It is not a cardiac med. Pt has appt scheduled with [name], PA 07/10/20. Will route to her to advise if ok to fill, or defer to PCP.

## 2020-07-10 ENCOUNTER — Ambulatory Visit: Payer: Medicare Other | Admitting: Physician Assistant

## 2020-07-10 ENCOUNTER — Encounter: Payer: Self-pay | Admitting: Physician Assistant

## 2020-07-10 ENCOUNTER — Other Ambulatory Visit: Payer: Self-pay

## 2020-07-10 ENCOUNTER — Other Ambulatory Visit
Admission: RE | Admit: 2020-07-10 | Discharge: 2020-07-10 | Disposition: A | Discharge status: Home / Self Care | Payer: Medicare Other | Attending: Physician Assistant | Admitting: Physician Assistant

## 2020-07-10 VITALS — BP 120/80 | HR 55 | Ht 65.0 in | Wt 182.0 lb

## 2020-07-10 DIAGNOSIS — I5032 Chronic diastolic (congestive) heart failure: Secondary | ICD-10-CM

## 2020-07-10 DIAGNOSIS — I480 Paroxysmal atrial fibrillation: Secondary | ICD-10-CM

## 2020-07-10 DIAGNOSIS — E781 Pure hyperglyceridemia: Secondary | ICD-10-CM

## 2020-07-10 DIAGNOSIS — I2510 Atherosclerotic heart disease of native coronary artery without angina pectoris: Secondary | ICD-10-CM

## 2020-07-10 DIAGNOSIS — I951 Orthostatic hypotension: Secondary | ICD-10-CM

## 2020-07-10 DIAGNOSIS — I495 Sick sinus syndrome: Secondary | ICD-10-CM

## 2020-07-10 DIAGNOSIS — N183 Chronic kidney disease, stage 3 unspecified: Secondary | ICD-10-CM

## 2020-07-10 DIAGNOSIS — E782 Mixed hyperlipidemia: Secondary | ICD-10-CM

## 2020-07-10 DIAGNOSIS — I10 Essential (primary) hypertension: Secondary | ICD-10-CM

## 2020-07-10 DIAGNOSIS — E1165 Type 2 diabetes mellitus with hyperglycemia: Secondary | ICD-10-CM

## 2020-07-10 DIAGNOSIS — Z950 Presence of cardiac pacemaker: Secondary | ICD-10-CM

## 2020-07-10 DIAGNOSIS — Z79899 Other long term (current) drug therapy: Secondary | ICD-10-CM

## 2020-07-10 DIAGNOSIS — G4733 Obstructive sleep apnea (adult) (pediatric): Secondary | ICD-10-CM

## 2020-07-10 LAB — BASIC METABOLIC PANEL
Anion gap: 9 (ref 5–15)
BUN: 28 mg/dL — ABNORMAL HIGH (ref 8–23)
CO2: 25 mmol/L (ref 22–32)
Calcium: 8.9 mg/dL (ref 8.9–10.3)
Chloride: 107 mmol/L (ref 98–111)
Creatinine, Ser: 1.2 mg/dL — ABNORMAL HIGH (ref 0.44–1.00)
GFR, Estimated: 46 mL/min — ABNORMAL LOW (ref >60)
Glucose, Bld: 113 mg/dL — ABNORMAL HIGH (ref 70–99)
Potassium: 3.8 mmol/L (ref 3.5–5.1)
Sodium: 141 mmol/L (ref 135–145)

## 2020-07-10 MED ORDER — TORSEMIDE 10 MG PO TABS: ORAL_TABLET | ORAL | 3 refills | Status: DC

## 2020-07-10 NOTE — Progress Notes (Signed)
Office Visit    Patient Name: Dawn Garcia [facility] Date of Encounter: 07/10/2020  PCP:  Perrin Maltese, MD   [place]  Cardiologist: Dr. Rockey Situ Advanced Practice Provider:  No care team member to display Electrophysiologist: Dr. Caryl Comes  Chief Complaint    Chief Complaint  Patient presents with  . Follow-up    Cardioversion follow up and c/o increased weight. Medications verbally reviewed with patient.     80 y.o. female with history of CAD with remote stent and patent stent by catheterization in 2014, paroxysmal atrial fibrillation s/p reported ablation in 2013 and repeat atrial fibrillation ablation 4/78/2956, chronic diastolic CHF, COPD, CKD, DM2, orthostatic hypotension, hyperlipidemia, hypothyroidism, reported history of bradycardia / 5-second pauses on cardiac monitoring with dual-chamber 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 for sick sinus syndrome/tachybradycardia syndrome and RA lead revision due to dislodgment, OSA, and here today for follow-up after cardioversion  Past Medical History    Past Medical History:  Diagnosis Date  . Age-related macular degeneration, dry, right eye   . Age-related macular degeneration, wet, left eye ([place])   . Arthritis    "all over; particularly in hands/fingers; all my joints" (10/05/2017)  . Atrial fibrillation ([place])   . CAD (coronary artery disease)   . CHF (congestive heart failure) (HCC)     A-Fib  . Chronic kidney disease   . COPD (chronic obstructive pulmonary disease) ([place])    "very mild" (10/05/2017)  . Family history of adverse reaction to anesthesia    "daughter w/PONV & woke up during endoscopy" (10/05/2017)  . GERD (gastroesophageal reflux disease)   . Gout   . Graves' disease    S/P "radioactive tx"  . Heart disease   . History of blood transfusion    "related to shoulder replacement"   . History of gout    "no longer on daily RX" (10/05/2017)  . History of hiatal hernia   . Hyperlipidemia   . Hypertension   .  Hypocalcemia   . Hypokalemia   . Hypothyroidism   . Macular degeneration   . Melanoma ([place]) ~ 2012   "off my back" (10/05/2017)  . Myocardial infarction [facility])    "was told I'd had one; don't really know when" (10/05/2017)  . Orthostatic hypotension 2020  . OSA on CPAP   . Pacemaker   . 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 Renal disorder   . 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 Sinus node dysfunction (HCC)   . Type II diabetes mellitus ([place])   . Vitamin B12 deficiency   . Vitamin D deficiency    Past Surgical History:  Procedure Laterality Date  . APPENDECTOMY    . ATRIAL FIBRILLATION ABLATION N/A 10/05/2017   Procedure: ATRIAL FIBRILLATION ABLATION;  Surgeon: Constance Haw, MD;  Location: [place] [facility];  Service: Cardiovascular;  Laterality: N/A;  . ATRIAL FIBRILLATION ABLATION  ~ 2014  . CARDIAC CATHETERIZATION  04/2017  . CARDIOVERSION N/A 06/22/2020   Procedure: CARDIOVERSION;  Surgeon: Nelva Bush, MD;  Location: [facility];  Service: Cardiovascular;  Laterality: N/A;  . COLONOSCOPY W/ BIOPSIES AND POLYPECTOMY  2015   1 polyp removed- repeat 3 years  . CORONARY ANGIOPLASTY WITH STENT PLACEMENT  ~ 2014  . EP IMPLANTABLE DEVICE N/A 11/10/2015   Procedure: Pacemaker Implant;  Surgeon: Will [name], MD;  Location: [place] [facility];  Service: Cardiovascular;  Laterality: N/A;  . EP IMPLANTABLE DEVICE N/A 11/11/2015  Procedure: Lead Revision/Repair;  Surgeon: Evans Lance, MD;  Location: [place] [facility];  Service: Cardiovascular;  Laterality: N/A;  . INGUINAL HERNIA REPAIR Left 1951  . JOINT REPLACEMENT    . LAPAROSCOPIC CHOLECYSTECTOMY    . MELANOMA EXCISION  ~ 2012   "off my back" (10/05/2017)  . PARATHYROIDECTOMY     "removed 3 glands; still have 1 gland" (10/05/2017)  . RIGHT/LEFT HEART CATH AND CORONARY ANGIOGRAPHY N/A 05/03/2017   Procedure: RIGHT/LEFT HEART CATH AND CORONARY ANGIOGRAPHY;  Surgeon: Leonie Man, MD;  Location: [place] [facility];  Service: Cardiovascular;  Laterality: N/A;  . TONSILLECTOMY    . TOTAL SHOULDER ARTHROPLASTY Right x 2  . VAGINAL HYSTERECTOMY     "total"    Allergies  Allergies  Allergen Reactions  . Demerol [Meperidine] Anaphylaxis    Tolerated Fentanyl 11/10/15  . Epinephrine Shortness Of Breath and Palpitations    Powder  . Multaq [Dronedarone] Diarrhea    Kidney failure and lung problems  . Sulfa Antibiotics Anaphylaxis  . Tetracyclines & Related Swelling and Other (See Comments)    Made nose, lips,  And tongue itchy    History of Present Illness    Dawn Garcia is a 80 y.o. female with PMH as above.  She is followed by the atrial fibrillation clinic in [place], as well as Dr. Caryl Comes.  She has a long history of atrial fibrillation with ablation in [place] in 2013 and repeat ablation by Dr. Curt Bears in 2019.  She was on amiodarone in the past but reported this was discontinued due to development of lung issues.  She did not tolerate Multaq.    She underwent DCCV 06/22/2020.   The device clinic received an alert for increased atrial fibrillation burden 06/26/2020.   When seen 06/30/2020, she was back in sinus rhythm.  Lasix was prescribed for wt gain and abdominal bloating.  She has been maintained on anticoagulation with Eliquis 5 mg twice daily and denies any bleeding issues on anticoagulation.  Today, 07/10/20, she reports she is intermittent dieting for her DM2 and also following with endocrinology. She reports that she feels volume up and only received a few tablets of lasix at her previous clinic visit. She reports taking 20mg  of lasix and then 40mg  of lasix. She feels as if she is bloated and that she is holding on to fluid in her stomach. She denies significant LEE. She reports abdominal aches not necessarily connected with food. She wonders if her mestinon is contributing. No palpitations, dyspnea, pnd, orthopnea, n, v, dizziness, syncope,  edema. No recent falls or syncopal events. She reports wt gain and at times reduced appetite though with consideration of intermittent fasting. Home wt usually 170-171 lbs and most recently 176.7lbs. She is using her CPAP. She reports medication compliance. No s/sx of bleeding. She reports home SBP 120-140 and DBP 50-80s.  Home Medications    Current Outpatient Medications on File Prior to Visit  Medication Sig Dispense Refill  . acetaminophen (TYLENOL) 650 MG CR tablet Take 650 mg by mouth at bedtime.    Marland Kitchen apixaban (ELIQUIS) 5 MG TABS tablet Take 1 tablet (5 mg total) by mouth 2 (two) times daily. 180 tablet 1  . Ascorbic Acid (VITAMIN C PO) Take 500 mg by mouth daily.    . carboxymethylcellulose (REFRESH PLUS) 0.5 % SOLN Place 1 drop into both eyes daily as needed (dry eyes).    . Cholecalciferol (VITAMIN D3) 5000 units TABS Take 5,000 Units  by mouth every morning.    . Continuous Blood Gluc Receiver (DEXCOM G6 RECEIVER) DEVI Use as instructed to check blood sugar daily. 1 each 1  . Continuous Blood Gluc Sensor (DEXCOM G6 SENSOR) MISC Use as instructed to check blood sugar daily 9 each 3  . Continuous Blood Gluc Transmit (DEXCOM G6 TRANSMITTER) MISC Use as instructed to check blood sugar. 1 each 3  . diltiazem (CARDIZEM) 30 MG tablet Take 1 tablet every 4 hours AS NEEDED for heart rate >100 30 tablet 1  . docusate sodium (COLACE) 100 MG capsule Take 100 mg by mouth once a week.    . fenofibrate 54 MG tablet Take 54 mg by mouth daily.    . furosemide (LASIX) 20 MG tablet Take 1 tablet by mouth daily for 3 days then only as needed 5 tablet 0  . glucose blood (ACCU-CHEK AVIVA PLUS) test strip Use as instructed to check blood sugar 2 times a day 200 each 12  . insulin degludec (TRESIBA FLEXTOUCH) 200 UNIT/ML FlexTouch Pen Inject 8 Units into the skin daily. 9 mL 2  . Insulin Syringe-Needle U-100 (INSULIN SYRINGE .3CC/31GX5/16") 31G X 5/16" 0.3 ML MISC Use to inject insulin twice daily 200 each 3   . levothyroxine (SYNTHROID) 112 MCG tablet Take 1 tablet (112 mcg total) by mouth daily. 90 tablet 0  . Multiple Vitamins-Minerals (ICAPS AREDS 2) CAPS Take 1 capsule by mouth 2 (two) times daily.    . pantoprazole (PROTONIX) 40 MG tablet Take 1 tablet (40 mg total) by mouth daily. 90 tablet 3  . pyridostigmine (MESTINON) 60 MG tablet TAKE 1/2 TABLET BY MOUTH BID - Please keep upcoming appt in July 2022 before anymore refills. Thanks 30 tablet 2  . Semaglutide, 1 MG/DOSE, (OZEMPIC, 1 MG/DOSE,) 2 MG/1.5ML SOPN INJECT 1 MG INTO THE SKIN ONCE WEEKLY 3 pen 3  . vitamin B-12 (CYANOCOBALAMIN) 500 MCG tablet Take 500 mcg by mouth daily.     No current facility-administered medications on file prior to visit.    Review of Systems    She reports wt gain and is uncertain the effect on her appetite with consideration of intermittent fasting. She has noted bloating and cramping and uncertain if mestinon is contributing. She denies chest pain, palpitations, dyspnea, pnd, orthopnea, n, v, dizziness, syncope, edema. No recent falls of syncopal events. No s/sx of bleeding.   All other systems reviewed and are otherwise negative except as noted above.  Physical Exam    VS:  BP 120/80 (BP Location: Left Arm, Patient Position: Sitting, Cuff Size: Normal)   Pulse (!) 55   Ht 5\' 5"  (1.651 m)   Wt 182 lb (82.6 kg)   SpO2 98%   BMI 30.29 kg/m  , BMI Body mass index is 30.29 kg/m. GEN: Well nourished, well developed, in no acute distress. HEENT: normal. Neck: Supple, no JVD, carotid bruits, or masses. Cardiac: bradycardic but regular, no murmurs, rubs, or gallops. No clubbing, cyanosis, edema.  Radials/DP/PT 2+ and equal bilaterally.   Respiratory:  Respirations regular and unlabored, clear to auscultation bilaterally. GI: Soft, nontender, nondistended, BS + x 4. MS: no deformity or atrophy. Skin: warm and dry, no rash. Neuro:  Strength and sensation are intact. Psych: Normal affect.  Accessory Clinical  Findings    ECG personally reviewed by me today - AV paced rhythm, 55 bpm, PR interval 106, QRS 170- no acute changes.  VITALS Reviewed today   Temp Readings from Last 3 Encounters:  06/22/20 97.7  F (36.5 C) (Oral)  03/20/19 (!) 97.5 F (36.4 C)  12/27/18 97.7 F (36.5 C) (Temporal)   BP Readings from Last 3 Encounters:  07/10/20 120/80  06/30/20 (!) 142/88  06/22/20 (!) 157/76   Pulse Readings from Last 3 Encounters:  07/10/20 (!) 55  06/30/20 (!) 55  06/22/20 (!) 56    Wt Readings from Last 3 Encounters:  07/10/20 182 lb (82.6 kg)  06/30/20 179 lb 9.6 oz (81.5 kg)  06/22/20 174 lb (78.9 kg)     LABS  reviewed today   Lab Results  Component Value Date   WBC 7.0 06/19/2020   HGB 14.2 06/19/2020   HCT 41.8 06/19/2020   MCV 88.2 06/19/2020   PLT 213 06/19/2020   Lab Results  Component Value Date   CREATININE 1.28 (H) 06/19/2020   BUN 29 (H) 06/19/2020   NA 140 06/19/2020   K 3.9 06/19/2020   CL 109 06/19/2020   CO2 22 06/19/2020   Lab Results  Component Value Date   ALT 14 10/21/2018   AST 23 10/21/2018   ALKPHOS 172 (H) 10/21/2018   BILITOT 0.6 10/21/2018   Lab Results  Component Value Date   CHOL 224 (H) 09/17/2019   HDL 30 (L) 09/17/2019   LDLCALC 119 (H) 09/17/2019   LDLDIRECT 63 09/17/2019   TRIG 426 (H) 09/17/2019   CHOLHDL 7.5 (H) 09/17/2019    Lab Results  Component Value Date   HGBA1C 6.9 (A) 03/31/2020   Lab Results  Component Value Date   TSH 0.88 03/31/2020     STUDIES/PROCEDURES reviewed today   [facility] 05/03/2017  Relatively normal right heart cath pressures with only mildly elevated LVEDP/PCP.  Prox LAD to Mid LAD lesion is 45% stenosed. Mid LAD lesion is 40% stenosed. Neither lesion was flow-limiting  Lat 2nd Diag lesion is 70% stenosed. Vessel is way too small for PCI  Previously placed Ost 1st Mrg stent (unknown type) is widely patent. Angiographically moderate disease in the mid and distal LAD with patent stent to  the circumflex.  Otherwise no significant disease. Mildly elevated LVEDP and PCWP with essentially normal pulmonary pressures. Adequately diuresed Suspect that patient's decompensation was related to A. fib with RVR and likely diastolic heart failure. Plan: Patient will return to nursing unit for ongoing care with TR band removal. Gentle hydration post cath Defer further plans to [facility] / EP service.  Echo 04/2017 Procedure narrative: Transthoracic echocardiography. Technically  difficult study with reduced echo windows.  - Left ventricle: The cavity size was normal. Wall thickness was  increased in a pattern of mild LVH. Systolic function was normal.  The estimated ejection fraction was in the range of 50% to 55%.  Doppler parameters are consistent with abnormal left ventricular  relaxation (grade 1 diastolic dysfunction). The E/e&' ratio is  between 8-15, suggesting indeterminate LV filling pressure.  - Aortic valve: Trileaflet. Sclerosis without stenosis. There was  trivial regurgitation.  - Mitral valve: Mildly thickened leaflets . There was trivial  regurgitation.  - Left atrium: The atrium was normal in size.  - Inferior vena cava: The vessel was normal in size. The  respirophasic diameter changes were in the normal range (>= 50%),  consistent with normal central venous pressure.   Assessment & Plan    Chronic diastolic heart failure --Reports abdominal bloating and wt gain. Wt is noted to be up from 4/19 visit at the Afib clinic. On exam today, she appears relatively euvolemic. At this time, I  do not feel she needs daily diuresis and agree with the PRN diuresis prescribed 06/30/20. She reports that she does not feel the lasix prescribed at her 4/19 visit has been effective, and given her abdominal bloating, will transition her to torsemide 10mg  x2-3d pending BMET. Stressed that, after the 2-3 days (pending BMET), she should go to PRN dosing  thereafter for wt gain 3 lbs overnight or 5 lbs in 1 week. Monitor BP closely. Reassess at RTC. Consider repeat echo at RTC. Previous echo with EF 50-55% and G1DD 04/2017 as above. R/LHC performed with mildly elevated LVEDP. Until 06/30/20, she had not been on a diuretic. Fluid and salt restrictions reviewed.    Atrial fibrillation s/p DCCV 06/22/20 --SR today as noted 06/30/20. She is s/p DCCV 06/22/20 with device report of Afib after cardioversion. Per Afib clinic notes, her afib burden at baseline is ~8%, and if more persistent episodes, dofetilide admission could be considered in the future. Continue current PRN Cardizem for rates over 100bpm. Continue Eliquis 5mg  BID for anticoagulation with CHA2DS2VASc score of at least  7. No s/sx of bleeding.  Coronary artery dz --No reported sx concerning for angina with 2019 catheterization as above.  As above, consider repeat echo as above. Continue to monitor. No ASA given chronic OAC. Not on a BB with PRN Cardizem as above. Aggressive risk factor modification recommended going forward, including initiation of LDL control as below / start of statin at RTC.  Hypertension  --Continue current medications. Reports BP higher at home. Not currently on ACE/ARB with comorbid HTN/DM2 but considered is history of orthostatic hypotension and history of renal function. Will check BMET. Recommended close monitoring of BP during Torsemide 10mg  x3d given SBP 120 today though notes higher BP at home.    Orthostatic hypotension --Reportedly on mestinon per Dr. Caryl Comes. Will defer ongoing management to follow-up with Dr. Caryl Comes. She will be careful with her 3d of diuresis given history of orthostatic hypotension, discussed in detail today and as above.  Hyperlipidemia, Hypertriglyceridemia --Most recent LDL not at goal and elevated Tg noted. Recommend discussion of statin/Zetia at RTC.  SSS s/p PPM --S/p PPM. Continue to follow with EP as directed.  OSA --Continue  CPAP.  DM2 --Defer to endocrinology.   CKD --Will check BMET today.  Medication changes: Stop lasix. Trial 3d of torsemide 10mg  then stop and only use if 3lb wt gain overnight or 5lb wt gain in 1 week with associated sx.  Labs ordered: BMET now and at RTC Studies / Imaging ordered: None Future considerations: Statin/Zetia given LDL/Tg above. Defer mestinon changes to Dr. Caryl Comes.  Disposition: RTC 1 month  *Please be aware that the above documentation was completed voice recognition software and may contain dictation errors.      Arvil Chaco, PA-C 07/10/2020

## 2020-07-10 NOTE — Patient Instructions (Signed)
Medication Instructions:  Your physician has recommended you make the following change in your medication:   STOP Lasix (Furosemide)  START Torsemide 10mg  daily x 3 days THEN take 10mg  daily AS NEEDED  *If you need a refill on your cardiac medications before your next appointment, please call your pharmacy*   Lab Work:  Your physician recommends that you have lab work TODAY at the medical mall at [facility]: BMET  -  Please go to the [facility]. You will check in at the front desk to the right as you walk into the atrium. Valet Parking is offered if needed. - No appointment needed. You may go any day between 7 am and 6 pm.   Testing/Procedures: None ordered   Follow-Up: At [facility], you and your health needs are our priority.  As part of our continuing mission to provide you with exceptional heart care, we have created designated Provider Care Teams.  These Care Teams include your primary Cardiologist (physician) and Advanced Practice Providers (APPs -  Physician Assistants and Nurse Practitioners) who all work together to provide you with the care you need, when you need it.  We recommend signing up for the patient portal called "MyChart".  Sign up information is provided on this After Visit Summary.  MyChart is used to connect with patients for Virtual Visits (Telemedicine).  Patients are able to view lab/test results, encounter notes, upcoming appointments, etc.  Non-urgent messages can be sent to your provider as well.   To learn more about what you can do with MyChart, go to [email or web address].    Your next appointment:   1 month(s) with BMET at office visit  The format for your next appointment:   In Person  Provider:   You may see Dr. Rockey Situ or one of the following Advanced Practice Providers on your designated Care Team:     [name], [place]

## 2020-07-13 ENCOUNTER — Encounter: Payer: Self-pay | Admitting: Internal Medicine

## 2020-07-13 ENCOUNTER — Telehealth: Payer: Self-pay | Admitting: Internal Medicine

## 2020-07-13 ENCOUNTER — Telehealth: Payer: Self-pay | Admitting: *Deleted

## 2020-07-13 DIAGNOSIS — E1121 Type 2 diabetes mellitus with diabetic nephropathy: Secondary | ICD-10-CM

## 2020-07-13 MED ORDER — POTASSIUM CHLORIDE ER 10 MEQ PO TBCR: EXTENDED_RELEASE_TABLET | ORAL | 5 refills | Status: DC

## 2020-07-13 MED ORDER — TRESIBA FLEXTOUCH 200 UNIT/ML ~~LOC~~ SOPN: 8.0000 [IU] | PEN_INJECTOR | Freq: Every day | SUBCUTANEOUS | 0 refills | Status: DC

## 2020-07-13 NOTE — Telephone Encounter (Signed)
Rx sent to preferred pharmacy.

## 2020-07-13 NOTE — Telephone Encounter (Signed)
Pharmacy needs more refills for   insulin degludec (TRESIBA FLEXTOUCH) 200 UNIT/ML FlexTouch Pen    [id number] - [place], [place] [address] AT [address] [place]

## 2020-07-13 NOTE — Telephone Encounter (Signed)
-----   Message from Arvil Chaco, [place] sent at 07/12/2020  2:52 PM EDT ----- Labs show  --Stable renal function with plan for 3 days of torsemide 10 mg --Potassium below goal at 3.8.  Recommendations --Continue 3 days of torsemide 10 mg qd as discussed.  --Start KCl tab with initial Kcl tab 69mEq x2 for 11mEq total. Thereafter, only take KCL tab 10 M EQ x1 whenever taking PRN torsemide 10mg  for wt gain 3lb overnight or 5lb in 1 week.

## 2020-07-13 NOTE — Telephone Encounter (Signed)
Spoke to pt. Notified of lab results and provider's recc.  Pt verbalized understanding.  She has completed 3 days of Torsemide 10mg .  Does report that wt only went down 1 lbs after 3 days.  Prior to Torsemide, states she had gained 6 lbs.   She will continue per recc Torsemide 10mg  PRN for wt sliding scale detailed below.  Pt will start KCl 47mEq 2 tablets (74mEq) initially on day one. Then will continue KCl 78mEq x 1 only when she takes PRN Torsemide. KCl sent to [facility] in [place] per pt's request.  Will forward to provider to notify about pt's 1lb weight decrease for any further recc.  Pt has no further questions at this time.

## 2020-07-13 NOTE — Telephone Encounter (Signed)
See subsequent telephone note forwarded to provider re pt message.

## 2020-07-22 NOTE — Telephone Encounter (Signed)
Thank you for keeping me in the loop. Hopefully she is feeling better on this regimen.

## 2020-07-23 ENCOUNTER — Telehealth: Payer: Self-pay | Admitting: Physician Assistant

## 2020-07-23 DIAGNOSIS — I5031 Acute diastolic (congestive) heart failure: Secondary | ICD-10-CM

## 2020-07-23 DIAGNOSIS — I10 Essential (primary) hypertension: Secondary | ICD-10-CM

## 2020-07-23 DIAGNOSIS — I509 Heart failure, unspecified: Secondary | ICD-10-CM

## 2020-07-23 NOTE — Telephone Encounter (Signed)
Could you reach out to the patient and have her do torsemide 20mg  and kcl tab 20mg  for 1 week and then drop down to her sliding scale diuresis again? I ordered follow-up labs for her and told her to keep in touch with me via MyChart.

## 2020-07-24 NOTE — Telephone Encounter (Signed)
Spoke with patient and reviewed instructions from provider to see if she had any questions. She reports that she started that today and confirmed doses, repeat labs, and she was apologetic that she did not reply to provider. Reviewed that I would make provider aware of her understanding and she was appreciative for the follow up

## 2020-07-27 NOTE — Telephone Encounter (Signed)
Echo has been ordered for acute on chronic HF per Jacquelyn.  Forwarded to scheduled to call pt.

## 2020-07-29 ENCOUNTER — Encounter: Payer: Self-pay | Admitting: Internal Medicine

## 2020-07-29 ENCOUNTER — Other Ambulatory Visit: Payer: Self-pay

## 2020-07-29 DIAGNOSIS — E1121 Type 2 diabetes mellitus with diabetic nephropathy: Secondary | ICD-10-CM

## 2020-07-29 MED ORDER — PEN NEEDLES 32G X 4 MM MISC: 3 refills | Status: DC

## 2020-07-30 ENCOUNTER — Encounter: Payer: Self-pay | Admitting: Internal Medicine

## 2020-07-30 ENCOUNTER — Other Ambulatory Visit: Payer: Self-pay

## 2020-07-30 ENCOUNTER — Ambulatory Visit: Payer: Medicare Other | Admitting: Internal Medicine

## 2020-07-30 VITALS — BP 148/92 | HR 77 | Ht 65.0 in | Wt 179.0 lb

## 2020-07-30 DIAGNOSIS — E890 Postprocedural hypothyroidism: Secondary | ICD-10-CM | POA: Diagnosis not present

## 2020-07-30 DIAGNOSIS — E892 Postprocedural hypoparathyroidism: Secondary | ICD-10-CM | POA: Diagnosis not present

## 2020-07-30 DIAGNOSIS — E1322 Other specified diabetes mellitus with diabetic chronic kidney disease: Secondary | ICD-10-CM

## 2020-07-30 DIAGNOSIS — E559 Vitamin D deficiency, unspecified: Secondary | ICD-10-CM | POA: Diagnosis not present

## 2020-07-30 DIAGNOSIS — E139 Other specified diabetes mellitus without complications: Secondary | ICD-10-CM | POA: Insufficient documentation

## 2020-07-30 LAB — POCT GLYCOSYLATED HEMOGLOBIN (HGB A1C): Hemoglobin A1C: 6.5 % — AB (ref 4.0–5.6)

## 2020-07-30 MED ORDER — GLUCOSE BLOOD VI STRP: ORAL_STRIP | 3 refills | Status: AC

## 2020-07-30 MED ORDER — OZEMPIC (2 MG/DOSE) 8 MG/3ML ~~LOC~~ SOPN: 2.0000 mg | PEN_INJECTOR | SUBCUTANEOUS | 3 refills | Status: DC

## 2020-07-30 NOTE — Patient Instructions (Addendum)
Please increase: - Ozempic 1 mg weekly  Please decrease: - Tresiba to 10 units daily  Please continue: - Levothyroxine 112 mcg daily  Take the thyroid hormone every day, with water, at least 30 minutes before breakfast, separated by at least 4 hours from: - acid reflux medications - calcium - iron - multivitamins  Please return in 4 months with your sugar log.

## 2020-07-30 NOTE — Progress Notes (Signed)
Patient ID: Dawn Garcia [facility], female   DOB: 1940/08/25, 80 y.o.   MRN: [id number]   This visit occurred during the SARS-CoV-2 public health emergency.  Safety protocols were in place, including screening questions prior to the visit, additional usage of staff PPE, and extensive cleaning of exam room while observing appropriate contact time as indicated for disinfecting solutions.   HPI  Dawn Garcia is a 80 y.o.-year-old female, initially referred by her PCP, Dr. Humphrey Rolls, presenting for follow-up for uncontrolled post ablative hypothyroidism and DM2, insulin-dependent since 2013, with long-term complications (atrial fibrillation, CHF, CKD).  Last visit 4 months ago.  Interim history: She continues intermittent fasting, started 04/2018.  Currently doing 16-8 when dose.  She lost more than 40 pounds since she started! No increased urination, blurry vision, nausea.  Post ablative hypothyroidism: Pt. has been dx with Graves ds. At 80 y/o. She had RAI Tx in 1960 >> postablative hypothyroidism  >> started on levothyroxine.  Patient had consistently suppressed TSH levels over the years so we are not decreasing her doses very slowly due to fatigue.  We decreased her levothyroxine 03/2019.  Pt is on levothyroxine 112 mcg daily, taken: - in am - fasting - at least 30 min from b'fast - no calcium   - no iron - no multivitamins - + PPIs-moved 3 to 4 hours after levothyroxine at last visit. - + B12   She was on amiodarone in 03/2017, but stopped in 04/2017 due to AKI.  Reviewed her TFTs: Lab Results  Component Value Date   TSH 0.88 03/31/2020   TSH 1.27 07/29/2019   TSH 0.71 05/06/2019   TSH 0.20 (L) 03/20/2019   TSH 0.05 (L) 12/27/2018   TSH 0.03 (L) 10/02/2018   TSH 0.34 (L) 03/27/2018   TSH 0.04 (L) 02/05/2018   TSH 0.198 (L) 04/26/2017   TSH 0.174 (L) 04/18/2017   FREET4 1.28 03/31/2020   FREET4 1.08 07/29/2019   FREET4 1.13 05/06/2019   FREET4 1.25 03/20/2019   FREET4 1.70  (H) 12/27/2018   FREET4 1.35 10/02/2018   FREET4 1.55 03/27/2018   FREET4 1.40 02/05/2018   FREET4 3.54 (H) 04/26/2017   FREET4 2.08 (H) 11/08/2015   T3FREE 3.4 02/05/2018   T3FREE 1.8 (L) 04/26/2017   T3FREE 3.0 11/08/2015   T3FREE 2.8 09/09/2015   T3FREE 3.1 08/19/2015  10/25/2017: TSH 0.021 (0.45-5.33), free T4 1.7 (0.66-1.14)  Her Graves' antibodies were not elevated: Lab Results  Component Value Date   TSI 102 02/05/2018    No results found for: THGAB No components found for: TPOAB  Pt denies: - feeling nodules in neck - hoarseness - dysphagia - choking - SOB with lying down  She has + FH of thyroid disorders in: mother (died after sx for toxic goiter) and sister (goiter).  No FH of thyroid cancer. No h/o radiation tx to head or neck other than RAI treatment.  No herbal supplements. No Biotin use. No recent steroids use.   She has a history of Graves' ophthalmopathy >> had increased IO pressure and did not respond to IV steroids >> she had radiation therapy and then eyelid surgery.  DM2: -Insulin-dependent since 2013.  Reviewed HbA1c levels: Lab Results  Component Value Date   HGBA1C 6.9 (A) 03/31/2020   HGBA1C 5.9 (A) 11/29/2019   HGBA1C 6.1 (A) 07/29/2019   HGBA1C 7.1 (A) 04/02/2019   HGBA1C 6.5 (A) 12/27/2018   HGBA1C 6.8 (A) 10/02/2018   HGBA1C 8.5 (A) 06/27/2018   HGBA1C 8.4 (  A) 02/05/2018   HGBA1C 9.4 (H) 04/28/2017   HGBA1C 7.8 (H) 11/29/2016  08/14/2018: HbA1c 8.0%  10/25/2017: HbA1c 8.4%  Previously on: - Novolin 70/30 -not taken in the day she is fasting: - 20 >> 15 units before b'fast - 16 >> 10 units before dinner - Ozempic 1 mg weekly in a.m.  Tried Jardiance >> diarrhea.  We changed to: - -added 03/2020 >> but stopped 2/2 yeast infections - Ozempic 1 mg weekly in a.m. - Restarted Tresiba 12 units daily >> restarted 3 weeks ago She stopped [place] in 12/2019 due to good control.  She checks her sugars more than 4 times a day with her  Dexcom CGM.   Prev.:  - am:  [id number] >> 97, 101-124 >> 83-120, 131 >> 135-150  - after b'fast: 168 >> n/c - lunch: 145, 149 >> n/c >> 95, 114 >> 140s - 2h after lunch:  132 >> 115, 148 >> 108 >> n/c >> 150-177 - before dinner:157-302 >> 150 >> n/c >> 97, 161 >> 129 >> 140 - at bedtime: 123-239 >> 93-138, 173 >> 170-180s, 200 x1  + History of CKD, latest BUN/creatinine: Lab Results  Component Value Date   BUN 28 (H) 07/10/2020   BUN 29 (H) 06/19/2020   Lab Results  Component Value Date   CREATININE 1.20 (H) 07/10/2020   CREATININE 1.28 (H) 06/19/2020  10/25/2017: BUN/creatinine: 17/1.4, GFR 37  Latest eye exam 11/2019: No DR but macular degeneration; previously: + DR; she continues IO injections  She has a history of hyperparathyroidism: - h/o first sx: 1978 (2 glands excised) - h/o second sx: 2010 (1 gland excised - 3.2 g)  She takes 1-2 calcium tablets a day and headaches are usually relieved by these.  Her ionized calcium was slightly low in the past but normalized at last check: Component     Latest Ref Rng & Units 10/02/2018  Calcium Ionized     4.8 - 5.6 mg/dL 4.89   Component     Latest Ref Rng & Units 02/05/2018 03/27/2018  Calcium Ionized     4.8 - 5.6 mg/dL 4.58 (L) 4.55 (L)   Total calcium level was normal Lab Results  Component Value Date   CALCIUM 8.9 07/10/2020   CALCIUM 9.2 06/19/2020   CALCIUM 9.1 09/17/2019   CALCIUM 8.9 10/21/2018   CALCIUM 8.9 04/18/2018   She has vitamin D deficiency: - she was on 1000 units vitamin D daily but this was stopped by PCP as her vitamin D was 80.  - we restarted 1000 units daily that she continues on this dose.   Reviewed latest vitamin D level: Lab Results  Component Value Date   VD25OH 79.5 03/31/2020   VD25OH 69.65 05/06/2019   VD25OH 57.76 10/02/2018   VD25OH 80.87 03/27/2018   VD25OH 59.0 08/19/2015   She has a history of B12 deficiency-managed by PCP.  Latest level was normal Lab Results  Component  Value Date   [id number] 10/02/2018   VITAMINB12 >2000 (H) 08/19/2015   She also has a history of OSA-on CPAP, OA, history of melanoma, macular degeneration. She has MCI.  ROS: Constitutional: no weight gain/no weight loss, + fatigue, no subjective hyperthermia, no subjective hypothermia Eyes: no blurry vision, no xerophthalmia ENT: no sore throat, + see HPI Cardiovascular: no CP/no SOB/no palpitations/no leg swelling Respiratory: no cough/no SOB/no wheezing Gastrointestinal: no N/no V/no D/no C/no acid reflux Musculoskeletal: no muscle aches/no joint aches Skin: no rashes, no hair loss Neurological:  no tremors/no numbness/no tingling/no dizziness  I reviewed pt's medications, allergies, PMH, social hx, family hx, and changes were documented in the history of present illness. Otherwise, unchanged from my initial visit note.  Past Medical History:  Diagnosis Date  . Age-related macular degeneration, dry, right eye   . Age-related macular degeneration, wet, left eye ([place])   . Arthritis    "all over; particularly in hands/fingers; all my joints" (10/05/2017)  . Atrial fibrillation ([place])   . CAD (coronary artery disease)   . CHF (congestive heart failure) (HCC)     A-Fib  . Chronic kidney disease   . COPD (chronic obstructive pulmonary disease) ([place])    "very mild" (10/05/2017)  . Family history of adverse reaction to anesthesia    "daughter w/PONV & woke up during endoscopy" (10/05/2017)  . GERD (gastroesophageal reflux disease)   . Gout   . Graves' disease    S/P "radioactive tx"  . Heart disease   . History of blood transfusion    "related to shoulder replacement"   . History of gout    "no longer on daily RX" (10/05/2017)  . History of hiatal hernia   . Hyperlipidemia   . Hypertension   . Hypocalcemia   . Hypokalemia   . Hypothyroidism   . Macular degeneration   . Melanoma ([place]) ~ 2012   "off my back" (10/05/2017)  . Myocardial infarction [facility])    "was told I'd had  one; don't really know when" (10/05/2017)  . Orthostatic hypotension 2020  . OSA on CPAP   . Pacemaker   . 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 Renal disorder   . 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 Sinus node dysfunction (HCC)   . Type II diabetes mellitus ([place])   . Vitamin B12 deficiency   . Vitamin D deficiency    Past Surgical History:  Procedure Laterality Date  . APPENDECTOMY    . ATRIAL FIBRILLATION ABLATION N/A 10/05/2017   Procedure: ATRIAL FIBRILLATION ABLATION;  Surgeon: Constance Haw, MD;  Location: [place] [facility];  Service: Cardiovascular;  Laterality: N/A;  . ATRIAL FIBRILLATION ABLATION  ~ 2014  . CARDIAC CATHETERIZATION  04/2017  . CARDIOVERSION N/A 06/22/2020   Procedure: CARDIOVERSION;  Surgeon: Nelva Bush, MD;  Location: [facility] OR[facility];  Service: Cardiovascular;  Laterality: N/A;  . COLONOSCOPY W/ BIOPSIES AND POLYPECTOMY  2015   1 polyp removed- repeat 3 years  . CORONARY ANGIOPLASTY WITH STENT PLACEMENT  ~ 2014  . EP IMPLANTABLE DEVICE N/A 11/10/2015   Procedure: Pacemaker Implant;  Surgeon: Will [name], MD;  Location: [place] [facility];  Service: Cardiovascular;  Laterality: N/A;  . EP IMPLANTABLE DEVICE N/A 11/11/2015   Procedure: Lead Revision/Repair;  Surgeon: Evans Lance, MD;  Location: [place] [facility];  Service: Cardiovascular;  Laterality: N/A;  . INGUINAL HERNIA REPAIR Left 1951  . JOINT REPLACEMENT    . LAPAROSCOPIC CHOLECYSTECTOMY    . MELANOMA EXCISION  ~ 2012   "off my back" (10/05/2017)  . PARATHYROIDECTOMY     "removed 3 glands; still have 1 gland" (10/05/2017)  . RIGHT/LEFT HEART CATH AND CORONARY ANGIOGRAPHY N/A 05/03/2017   Procedure: RIGHT/LEFT HEART CATH AND CORONARY ANGIOGRAPHY;  Surgeon: Leonie Man, MD;  Location: [place] [facility];  Service: Cardiovascular;  Laterality: N/A;  . TONSILLECTOMY    . TOTAL SHOULDER ARTHROPLASTY Right x 2  . VAGINAL HYSTERECTOMY     "total"  Social History   Socioeconomic History  . Marital status: Married    Spouse name: Not on file  . Number of children: 3  . Years of education: Not on file  . Highest education level: Not on file  Occupational History  . Occupation: Retired Animal nutritionist  . Financial resource strain: Not on file  . Food insecurity:    Worry: Not on file    Inability: Not on file  . Transportation needs:    Medical: Not on file    Non-medical: Not on file  Tobacco Use  . Smoking status: Former Smoker, quit in 1967    Packs/day: 2.00    Years: 3.00    Pack years: 6.00    Types: Cigarettes  . Smokeless tobacco: Never Used  . Tobacco comment: 10/05/2017 "smoked in my 20's"  Substance and Sexual Activity  . Alcohol use: Yes    Comment: 1-2 mixed drinks per month  . Drug use: Never  Lifestyle  Relationships  Social History Narrative   Lives in [place] w/ husband.  She walks her dog every day.  Current Outpatient Medications on File Prior to Visit  Medication Sig Dispense Refill  . acetaminophen (TYLENOL) 650 MG CR tablet Take 650 mg by mouth at bedtime.    Marland Kitchen apixaban (ELIQUIS) 5 MG TABS tablet Take 1 tablet (5 mg total) by mouth 2 (two) times daily. 180 tablet 1  . Ascorbic Acid (VITAMIN C PO) Take 500 mg by mouth daily.    . carboxymethylcellulose (REFRESH PLUS) 0.5 % SOLN Place 1 drop into both eyes daily as needed (dry eyes).    . Cholecalciferol (VITAMIN D3) 5000 units TABS Take 5,000 Units by mouth every morning.    . Continuous Blood Gluc Receiver (DEXCOM G6 RECEIVER) DEVI Use as instructed to check blood sugar daily. 1 each 1  . Continuous Blood Gluc Sensor (DEXCOM G6 SENSOR) MISC Use as instructed to check blood sugar daily 9 each 3  . Continuous Blood Gluc Transmit (DEXCOM G6 TRANSMITTER) MISC Use as instructed to check blood sugar. 1 each 3  . diltiazem (CARDIZEM) 30 MG tablet Take 1 tablet every 4 hours AS NEEDED for heart rate >100 30 tablet 1  . docusate sodium (COLACE)  100 MG capsule Take 100 mg by mouth once a week.    . fenofibrate 54 MG tablet Take 54 mg by mouth daily.    Marland Kitchen glucose blood (ACCU-CHEK AVIVA PLUS) test strip Use as instructed to check blood sugar 2 times a day 200 each 12  . insulin degludec (TRESIBA FLEXTOUCH) 200 UNIT/ML FlexTouch Pen Inject 8 Units into the skin daily. 3 mL 0  . Insulin Pen Needle (PEN NEEDLES) 32G X 4 MM MISC Use to inject into the skin daily 100 each 3  . Insulin Syringe-Needle U-100 (INSULIN SYRINGE .3CC/31GX5/16") 31G X 5/16" 0.3 ML MISC Use to inject insulin twice daily 200 each 3  . levothyroxine (SYNTHROID) 112 MCG tablet Take 1 tablet (112 mcg total) by mouth daily. 90 tablet 0  . Multiple Vitamins-Minerals (ICAPS AREDS 2) CAPS Take 1 capsule by mouth 2 (two) times daily.    . pantoprazole (PROTONIX) 40 MG tablet Take 1 tablet (40 mg total) by mouth daily. 90 tablet 3  . potassium chloride (KLOR-CON) 10 MEQ tablet Take 2 tablets (9mEq) on day one. Then take 1 tablet (96mEq) once per day only when take Torsemide. 30 tablet 5  . pyridostigmine (MESTINON) 60 MG tablet TAKE 1/2 TABLET BY  MOUTH BID - Please keep upcoming appt in July 2022 before anymore refills. Thanks 30 tablet 2  . Semaglutide, 1 MG/DOSE, (OZEMPIC, 1 MG/DOSE,) 2 MG/1.5ML SOPN INJECT 1 MG INTO THE SKIN ONCE WEEKLY 3 pen 3  . torsemide (DEMADEX) 10 MG tablet Take 1 tablet (10mg ) daily x 3 days, then take 10mg  daily AS NEEDED. 30 tablet 3  . vitamin B-12 (CYANOCOBALAMIN) 500 MCG tablet Take 500 mcg by mouth daily.     No current facility-administered medications on file prior to visit.   Allergies  Allergen Reactions  . Demerol [Meperidine] Anaphylaxis    Tolerated Fentanyl 11/10/15  . Epinephrine Shortness Of Breath and Palpitations    Powder  . Multaq [Dronedarone] Diarrhea    Kidney failure and lung problems  . Sulfa Antibiotics Anaphylaxis  . Tetracyclines & Related Swelling and Other (See Comments)    Made nose, lips,  And tongue itchy    Family History  Problem Relation Age of Onset  . Thyroid disease Mother 70       3 days after surgery  . Heart defect Father 62       ?ascending aortic aneurysm  . Breast cancer Sister   . Hepatitis Brother   . Colon cancer Maternal Grandmother   . Diabetes Maternal Grandmother   . Diabetes Maternal Grandfather   . Colon cancer Paternal Grandmother        20's  . Rectal cancer Son 70  . Breast cancer Half-Sister   . Diabetes Paternal Uncle   . Dementia Paternal Uncle   . Colon polyps Neg Hx   . Stomach cancer Neg Hx   . Liver cancer Neg Hx   . Pancreatic cancer Neg Hx    Also: -Diabetes in maternal grandparents -Hypertension and uncle-hyperlipidemia in the whole family -Heart disease in father, mother, uncle-thyroid problems in mother and sister-cancer in son, grandmother, half sister  PE: BP (!) 148/92 (BP Location: Right Arm, Patient Position: Sitting, Cuff Size: Normal)   Pulse 77   Ht 5\' 5"  (1.651 m)   Wt 179 lb (81.2 kg)   SpO2 97%   BMI 29.79 kg/m  Wt Readings from Last 3 Encounters:  07/30/20 179 lb (81.2 kg)  07/10/20 182 lb (82.6 kg)  06/30/20 179 lb 9.6 oz (81.5 kg)   Constitutional: overweight, in NAD Eyes: PERRLA, EOMI, no exophthalmos ENT: moist mucous membranes, no thyromegaly, no cervical lymphadenopathy Cardiovascular: RRR, No MRG Respiratory: CTA B Gastrointestinal: abdomen soft, NT, ND, BS+ Musculoskeletal: no deformities, strength intact in all 4 Skin: moist, warm, no rashes Neurological: no tremor with outstretched hands, DTR normal in all 4  ASSESSMENT: 1. Postablative Hypothyroidism  2.  LADA, managed as type 2 diabetes  3.  History of hypocalcemia -History of hyperparathyroidism  4.  Vitamin D deficiency  PLAN:  1. Patient with longstanding, uncontrolled, hypothyroidism, with a long history of suppressed TSH, now with normal TFTs. We discussed in the past about possible side effects of over replacement with thyroid hormones  to  include cardiac arrhythmia (she has a history of atrial fibrillation), hypercoagulability and, in the long run, osteoporosis and increased mortality. - latest thyroid labs reviewed with pt >> normal: Lab Results  Component Value Date   TSH 0.88 03/31/2020   - she continues on LT4 112 mcg daily - pt feels good on this dose - we discussed about taking the thyroid hormone every day, with water, >30 minutes before breakfast, separated by >4 hours from acid reflux medications, calcium,  iron, multivitamins. Pt. is taking it correctly now -at last visit, we moved Protonix at least 3 to 4 hours after levothyroxine.  She was taking the PPI too close to levothyroxine and was complaining of fatigue and swelling, possibly consistent with hypothyroidism.  These have improved. - will check thyroid tests today: TSH and fT4 - If labs are abnormal, she will need to return for repeat TFTs in 1.5 months  2. LADA -Patient with longstanding, previously uncontrolled type 2 diabetes, now with drastically improved control after she started intermittent fasting.  HbA1c improved to 5.9%, but worsened at last visit to 6.9%. -She was initially on insulin, but she was able to decrease the dose after starting Ozempic and she ended up stopping it completely at the end of last year as sugars were well controlled. We added Jardiance at that time, however, per OB/GYN, she may need to stop it due to yeast infections.  We started insulin approximately 3 weeks ago.  Sugars improved afterwards. 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 CGM interpretation: -At today's visit, we reviewed her CGM downloads: It appears that 89.4% of values are in target range (goal >70%), while 10.6% are higher than 180 (goal <25%), and 0% are lower than 70 (goal <4%).  The calculated average blood sugar is 136.  The projected HbA1c for the next 3 months  (GMI) is 6.6%. -Reviewing the CGM trends, her sugars are very well controlled and actually in the lower range overnight and they increase after lunch and after dinner.  She is not eating breakfast.  At this visit, I advised him to decrease [place] to avoid low blood sugars overnight, but we will also try to increase the Ozempic dose to better help with postprandial blood sugars.  She tolerates well the lower dose. -I advised her to: Patient Instructions  Please increase: - Ozempic 1 mg weekly  Please decrease: - Tresiba to 10 units daily  Please continue: - Levothyroxine 112 mcg daily  Take the thyroid hormone every day, with water, at least 30 minutes before breakfast, separated by at least 4 hours from: - acid reflux medications - calcium - iron - multivitamins  Please return in 4 months with your sugar log.   - we checked her HbA1c: 6.5% (lower) - advised to check sugars at different times of the day - 4x a day, rotating check times - advised for yearly eye exams >> she is UTD - return to clinic in 4 months  3. H/o Hypocalcemia -She has a history of hyperparathyroidism, now status post 3/4 parathyroidectomy -Recent calcium level was normal: Lab Results  Component Value Date   CALCIUM 8.9 07/10/2020   PHOS 4.1 05/03/2017  -She continues to take 1 Tums tablet with lunch and 1 later in the afternoon  4.  Vitamin D deficiency -She continues on 1000 units vitamin D daily -Last vitamin at last visit, vitamin D was 79, at goal -We will continue the above dose  Philemon Kingdom, MD PhD [facility] Endocrinology

## 2020-07-31 ENCOUNTER — Other Ambulatory Visit
Admission: RE | Admit: 2020-07-31 | Discharge: 2020-07-31 | Disposition: A | Discharge status: Home / Self Care | Payer: Medicare Other | Attending: Physician Assistant | Admitting: Physician Assistant

## 2020-07-31 DIAGNOSIS — I5031 Acute diastolic (congestive) heart failure: Secondary | ICD-10-CM | POA: Insufficient documentation

## 2020-07-31 LAB — BASIC METABOLIC PANEL
Anion gap: 9 (ref 5–15)
BUN: 25 mg/dL — ABNORMAL HIGH (ref 8–23)
CO2: 24 mmol/L (ref 22–32)
Calcium: 8.9 mg/dL (ref 8.9–10.3)
Chloride: 106 mmol/L (ref 98–111)
Creatinine, Ser: 1.28 mg/dL — ABNORMAL HIGH (ref 0.44–1.00)
GFR, Estimated: 43 mL/min — ABNORMAL LOW (ref >60)
Glucose, Bld: 151 mg/dL — ABNORMAL HIGH (ref 70–99)
Potassium: 4.1 mmol/L (ref 3.5–5.1)
Sodium: 139 mmol/L (ref 135–145)

## 2020-07-31 LAB — BRAIN NATRIURETIC PEPTIDE: B Natriuretic Peptide: 111.6 pg/mL — ABNORMAL HIGH (ref 0.0–100.0)

## 2020-08-04 ENCOUNTER — Other Ambulatory Visit: Payer: Self-pay

## 2020-08-04 ENCOUNTER — Ambulatory Visit (INDEPENDENT_AMBULATORY_CARE_PROVIDER_SITE_OTHER): Payer: Medicare Other

## 2020-08-04 DIAGNOSIS — I509 Heart failure, unspecified: Secondary | ICD-10-CM | POA: Diagnosis not present

## 2020-08-04 MED ORDER — PERFLUTREN LIPID MICROSPHERE: 1.0000 mL | INTRAVENOUS | Status: AC | PRN

## 2020-08-05 LAB — ECHOCARDIOGRAM COMPLETE
AR max vel: 3.18 cm2
AV Area VTI: 3.6 cm2
AV Area mean vel: 3.49 cm2
AV Mean grad: 2 mmHg
AV Peak grad: 4.2 mmHg
Ao pk vel: 1.03 m/s
Area-P 1/2: 4.39 cm2
S' Lateral: 3.3 cm
Single Plane A4C EF: 52 %

## 2020-08-13 ENCOUNTER — Other Ambulatory Visit: Payer: Self-pay

## 2020-08-13 ENCOUNTER — Encounter: Payer: Self-pay | Admitting: Physician Assistant

## 2020-08-13 ENCOUNTER — Ambulatory Visit: Payer: Medicare Other | Admitting: Physician Assistant

## 2020-08-13 VITALS — BP 160/92 | HR 57 | Ht 65.0 in | Wt 180.0 lb

## 2020-08-13 DIAGNOSIS — E781 Pure hyperglyceridemia: Secondary | ICD-10-CM

## 2020-08-13 DIAGNOSIS — I951 Orthostatic hypotension: Secondary | ICD-10-CM

## 2020-08-13 DIAGNOSIS — Z79899 Other long term (current) drug therapy: Secondary | ICD-10-CM | POA: Diagnosis not present

## 2020-08-13 DIAGNOSIS — I10 Essential (primary) hypertension: Secondary | ICD-10-CM

## 2020-08-13 DIAGNOSIS — I509 Heart failure, unspecified: Secondary | ICD-10-CM

## 2020-08-13 DIAGNOSIS — Z950 Presence of cardiac pacemaker: Secondary | ICD-10-CM

## 2020-08-13 DIAGNOSIS — I25118 Atherosclerotic heart disease of native coronary artery with other forms of angina pectoris: Secondary | ICD-10-CM | POA: Diagnosis not present

## 2020-08-13 DIAGNOSIS — E782 Mixed hyperlipidemia: Secondary | ICD-10-CM

## 2020-08-13 DIAGNOSIS — R079 Chest pain, unspecified: Secondary | ICD-10-CM

## 2020-08-13 DIAGNOSIS — I2510 Atherosclerotic heart disease of native coronary artery without angina pectoris: Secondary | ICD-10-CM

## 2020-08-13 DIAGNOSIS — E785 Hyperlipidemia, unspecified: Secondary | ICD-10-CM

## 2020-08-13 DIAGNOSIS — R0600 Dyspnea, unspecified: Secondary | ICD-10-CM

## 2020-08-13 DIAGNOSIS — R0609 Other forms of dyspnea: Secondary | ICD-10-CM

## 2020-08-13 DIAGNOSIS — N183 Chronic kidney disease, stage 3 unspecified: Secondary | ICD-10-CM

## 2020-08-13 DIAGNOSIS — I480 Paroxysmal atrial fibrillation: Secondary | ICD-10-CM | POA: Diagnosis not present

## 2020-08-13 DIAGNOSIS — I495 Sick sinus syndrome: Secondary | ICD-10-CM

## 2020-08-13 MED ORDER — NITROGLYCERIN 0.4 MG SL SUBL: 0.4000 mg | SUBLINGUAL_TABLET | SUBLINGUAL | 3 refills | Status: AC | PRN

## 2020-08-13 NOTE — Patient Instructions (Signed)
Medication Instructions:  Your physician has recommended you make the following change in your medication:   STOP taking Mestinon - Please HOLD at this time  We sent in refill for your Nitroglycerin 0.4mg   For as needed Nitroglycerin, if you develop chest pain: . Sit and rest 5 minutes. If chest pain does not resolve place 1 nitroglycerin under your tongue and wait 5 minutes. . If chest pain does not resolve, place a 2nd nitroglycerin under your tongue and wait 5 more minutes. . If chest pain does not resolve, place a 3rd nitroglycerin under your tongue and seek emergency services.    *If you need a refill on your cardiac medications before your next appointment, please call your pharmacy*   Lab Work: Your physician recommends that you have lab work TODAY: CMET, Lipid panel  If you have labs (blood work) drawn today and your tests are completely normal, you will receive your results only by: Marland Kitchen MyChart Message (if you have MyChart) OR . A paper copy in the mail If you have any lab test that is abnormal or we need to change your treatment, we will call you to review the results.   Testing/Procedures: [place]  Your caregiver has ordered a Stress Test with nuclear imaging. The purpose of this test is to evaluate the blood supply to your heart muscle. This procedure is referred to as a "Non-Invasive Stress Test." This is because other than having an IV started in your vein, nothing is inserted or "invades" your body. Cardiac stress tests are done to find areas of poor blood flow to the heart by determining the extent of coronary artery disease (CAD). Some patients exercise on a treadmill, which naturally increases the blood flow to your heart, while others who are  unable to walk on a treadmill due to physical limitations have a pharmacologic/chemical stress agent called Lexiscan . This medicine will mimic walking on a treadmill by temporarily increasing your coronary blood flow.    Please note: these test may take anywhere between 2-4 hours to complete  PLEASE REPORT TO [place] AT THE FIRST DESK WILL DIRECT YOU WHERE TO GO  Date of Procedure:_____________________________________  Arrival Time for Procedure:______________________________  Instructions regarding medication:   __X__ : Hold diabetes medication morning of procedure  Take HALF dose insulin the evening prior to procedure   HOLD insulin and any diabetic medication the morning of procedure   _X__:  Hold other medications as follows:  Hold Torsemide morning of procedure   PLEASE NOTIFY THE OFFICE AT LEAST 24 HOURS IN ADVANCE IF YOU ARE UNABLE TO KEEP YOUR APPOINTMENT.  [phone] AND  PLEASE NOTIFY NUCLEAR MEDICINE AT [facility] AT LEAST 24 HOURS IN ADVANCE IF YOU ARE UNABLE TO KEEP YOUR APPOINTMENT. [phone]  How to prepare for your Myoview test:  1. Do not eat or drink after midnight 2. No caffeine for 24 hours prior to test 3. No smoking 24 hours prior to test. 4. Your medication may be taken with water.  If your doctor stopped a medication because of this test, do not take that medication. 5. Ladies, please do not wear dresses.  Skirts or pants are appropriate. Please wear a short sleeve shirt. 6. No perfume, cologne or lotion.   Follow-Up: At [facility], you and your health needs are our priority.  As part of our continuing mission to provide you with exceptional heart care, we have created designated Provider Care Teams.  These Care  Teams include your primary Cardiologist (physician) and Advanced Practice Providers (APPs -  Physician Assistants and Nurse Practitioners) who all work together to provide you with the care you need, when you need it.  We recommend signing up for the patient portal called "MyChart".  Sign up information is provided on this After Visit Summary.  MyChart is used to connect with patients for Virtual Visits (Telemedicine).   Patients are able to view lab/test results, encounter notes, upcoming appointments, etc.  Non-urgent messages can be sent to your provider as well.   To learn more about what you can do with MyChart, go to [email or web address].    Your next appointment:    Follow up after Lexiscan  The format for your next appointment:   In Person  Provider:   You may see Dr. Saunders Revel or one of the following Advanced Practice Providers on your designated Care Team:     [name], [place]

## 2020-08-13 NOTE — Progress Notes (Addendum)
Office Visit    Patient Name: Dawn Garcia [facility] Date of Encounter: 08/13/2020  PCP:  Perrin Maltese, MD   [place]  Cardiologist: Dr. Rockey Situ Advanced Practice Provider:  No care team member to display Electrophysiologist: Dr. Caryl Comes  Chief Complaint    Chief Complaint  Patient presents with  . Follow-up    1 Month follow up and discuss results of echo. Patient has some concerns about weight gain. Medications verbally reviewed with patient.    80 y.o. female with history of CAD with remote stent and patent stent by catheterization in 2014, paroxysmal atrial fibrillation s/p reported ablation in 2013 and repeat atrial fibrillation ablation 2/53/6644, chronic diastolic CHF, COPD, CKD, DM2, orthostatic hypotension, hyperlipidemia, hypothyroidism, reported history of bradycardia / 5-second pauses on cardiac monitoring with dual-chamber 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 for sick sinus syndrome/tachybradycardia syndrome and RA lead revision due to dislodgment, OSA, and here today for follow-up.  Past Medical History    Past Medical History:  Diagnosis Date  . Age-related macular degeneration, dry, right eye   . Age-related macular degeneration, wet, left eye ([place])   . Arthritis    "all over; particularly in hands/fingers; all my joints" (10/05/2017)  . Atrial fibrillation ([place])   . CAD (coronary artery disease)   . CHF (congestive heart failure) (HCC)     A-Fib  . Chronic kidney disease   . COPD (chronic obstructive pulmonary disease) ([place])    "very mild" (10/05/2017)  . Family history of adverse reaction to anesthesia    "daughter w/PONV & woke up during endoscopy" (10/05/2017)  . GERD (gastroesophageal reflux disease)   . Gout   . Graves' disease    S/P "radioactive tx"  . Heart disease   . History of blood transfusion    "related to shoulder replacement"   . History of gout    "no longer on daily RX" (10/05/2017)  . History of hiatal hernia   . Hyperlipidemia    . Hypertension   . Hypocalcemia   . Hypokalemia   . Hypothyroidism   . Macular degeneration   . Melanoma ([place]) ~ 2012   "off my back" (10/05/2017)  . Myocardial infarction [facility])    "was told I'd had one; don't really know when" (10/05/2017)  . Orthostatic hypotension 2020  . OSA on CPAP   . Pacemaker   . 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 Renal disorder   . 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 Sinus node dysfunction (HCC)   . Type II diabetes mellitus ([place])   . Vitamin B12 deficiency   . Vitamin D deficiency    Past Surgical History:  Procedure Laterality Date  . APPENDECTOMY    . ATRIAL FIBRILLATION ABLATION N/A 10/05/2017   Procedure: ATRIAL FIBRILLATION ABLATION;  Surgeon: Constance Haw, MD;  Location: [place] [facility];  Service: Cardiovascular;  Laterality: N/A;  . ATRIAL FIBRILLATION ABLATION  ~ 2014  . CARDIAC CATHETERIZATION  04/2017  . CARDIOVERSION N/A 06/22/2020   Procedure: CARDIOVERSION;  Surgeon: Nelva Bush, MD;  Location: [facility] ORS;  Service: Cardiovascular;  Laterality: N/A;  . COLONOSCOPY W/ BIOPSIES AND POLYPECTOMY  2015   1 polyp removed- repeat 3 years  . CORONARY ANGIOPLASTY WITH STENT PLACEMENT  ~ 2014  . EP IMPLANTABLE DEVICE N/A 11/10/2015   Procedure: Pacemaker Implant;  Surgeon: Will [name], MD;  Location: [place] [facility];  Service: Cardiovascular;  Laterality: N/A;  .  EP IMPLANTABLE DEVICE N/A 11/11/2015   Procedure: Lead Revision/Repair;  Surgeon: Evans Lance, MD;  Location: [place] [facility];  Service: Cardiovascular;  Laterality: N/A;  . INGUINAL HERNIA REPAIR Left 1951  . JOINT REPLACEMENT    . LAPAROSCOPIC CHOLECYSTECTOMY    . MELANOMA EXCISION  ~ 2012   "off my back" (10/05/2017)  . PARATHYROIDECTOMY     "removed 3 glands; still have 1 gland" (10/05/2017)  . RIGHT/LEFT HEART CATH AND CORONARY ANGIOGRAPHY N/A 05/03/2017   Procedure: RIGHT/LEFT HEART CATH AND CORONARY  ANGIOGRAPHY;  Surgeon: Leonie Man, MD;  Location: [place] [facility];  Service: Cardiovascular;  Laterality: N/A;  . TONSILLECTOMY    . TOTAL SHOULDER ARTHROPLASTY Right x 2  . VAGINAL HYSTERECTOMY     "total"    Allergies  Allergies  Allergen Reactions  . Demerol [Meperidine] Anaphylaxis    Tolerated Fentanyl 11/10/15  . Epinephrine Shortness Of Breath and Palpitations    Powder  . Multaq [Dronedarone] Diarrhea    Kidney failure and lung problems  . Sulfa Antibiotics Anaphylaxis  . Tetracyclines & Related Swelling and Other (See Comments)    Made nose, lips,  And tongue itchy    History of Present Illness    Dawn Garcia is a 80 y.o. female with PMH as above.  She is followed by the atrial fibrillation clinic in [place], as well as Dr. Caryl Comes.  She has a long history of atrial fibrillation with ablation in [place] in 2013 and repeat ablation by Dr. Curt Bears in 2019.  She was on amiodarone in the past but reported this was discontinued due to development of lung issues.  She did not tolerate Multaq.    She underwent DCCV 06/22/2020.   The device clinic received an alert for increased atrial fibrillation burden 06/26/2020.   When seen 06/30/2020, she was back in sinus rhythm.  Lasix was prescribed for wt gain and abdominal bloating.  She has been maintained on anticoagulation with Eliquis 5 mg twice daily and denies any bleeding issues on anticoagulation.  Seen 07/10/2020 with report of intermittent dieting for DM2 and regular follow-up with endocrinology.  She felt volume up and reported only receipt of a few tablets of Lasix at a previous clinic visit.  She reports taking 20 mg of Lasix daily then 40 mg of Lasix.  She was bloated and felt as if she is holding onto fluid in her stomach.  She reported abdominal aches that were not necessarily connected with food.  She wondered if her Mestinon was contributing.  She reported weight gain and at times reduced appetite though  with consideration of intermittent fasting.  Home weight 1 7171 pounds and most recently 176.7 pounds.  She was using her CPAP.  She reported home SBP 1 20-1 40 and DBP 50 to 80s.  Lasix was changed to torsemide.  Between visits, she reported ongoing weight gain and feelings of being volume up.  Several attempts to increase volume output were made with patient report that she was still holding onto volume and not having very much volume output.  As a result, echo obtained with results as below showing EF 50 to 55%, mild LVH, G2 DD, elevated left atrial pressure, mild LAE, mild to moderate MR with degenerative mitral valve, moderate calcification of the aortic valve with trivial AR, mild dilation of the aorta at 37 mm, mildly dilated pulmonary artery.  Today, 08/13/2020, she returns to clinic and reports that she still feels volume up.  She reports dyspnea or shortness of breath with activity.  She reports it usually takes about 15 minutes of rest before she feels back to baseline.  She usually will sit on a brick wall to rest.  When running errands, she reports her legs feel weak and she feels as if her breathing struggles.  She has only been taking 1 Mestinon per day, as 2 Mestinon per day have been aggravating her stomach.  He is uncertain if this is the reason for her change in appetite.  She usually takes her Mestinon around noon.  Over the last 2 nights leading up to her clinic visit, she checked her blood pressure with BP 177/98 and repeat BP 183/103.  She reports that she had an associated headache with this elevated blood pressure.  She reports recent chest pain, described as central chest discomfort or ache.  This occurs after exertion.  Sometimes, she feels discomfort in her throat as well.  She reports some diaphoresis with CP.  She has not checked her blood pressure at the time that this occurs.  No presyncope or syncope.  She reports previous weight 171- 172.  She has seen weight as high as 177  pounds.  She reports increasing shortness of breath with weight gain.  She has now gained 4 pounds and then another 2 pounds since that time. Wt now 174-177. Today, she noted shortness of breath after getting off the table.  BP 160/92 with HR 57 bpm and oxygen saturation 98%.  Weight 180 pounds.  She wonders what will happen if she goes down on her Mestinon, as she wonders if this is contributing to her elevated blood pressure.  She has recently learned about a family history of heart disease in her father and is trying to find out more information regarding this history.  She notes that he died at the age of 80.  She is uncertain if an AVM versus thrombus was involved.  She does report some stress regarding her husband who has Alzheimer's. She reports she is not taking her torsemide at this time though did note smaller ankles with diuresis.  She is taking as needed MiraLAX for constipation.  Home Medications    Current Outpatient Medications on File Prior to Visit  Medication Sig Dispense Refill  . acetaminophen (TYLENOL) 650 MG CR tablet Take 650 mg by mouth at bedtime.    Marland Kitchen apixaban (ELIQUIS) 5 MG TABS tablet Take 1 tablet (5 mg total) by mouth 2 (two) times daily. 180 tablet 1  . Ascorbic Acid (VITAMIN C PO) Take 500 mg by mouth daily.    . carboxymethylcellulose (REFRESH PLUS) 0.5 % SOLN Place 1 drop into both eyes daily as needed (dry eyes).    . Cholecalciferol (VITAMIN D3) 5000 units TABS Take 5,000 Units by mouth every morning.    . Continuous Blood Gluc Receiver (DEXCOM G6 RECEIVER) DEVI Use as instructed to check blood sugar daily. 1 each 1  . Continuous Blood Gluc Sensor (DEXCOM G6 SENSOR) MISC Use as instructed to check blood sugar daily 9 each 3  . Continuous Blood Gluc Transmit (DEXCOM G6 TRANSMITTER) MISC Use as instructed to check blood sugar. 1 each 3  . diltiazem (CARDIZEM) 30 MG tablet Take 1 tablet every 4 hours AS NEEDED for heart rate >100 30 tablet 1  . docusate sodium (COLACE)  100 MG capsule Take 100 mg by mouth once a week.    . fenofibrate 54 MG tablet Take 54 mg by mouth daily.    Marland Kitchen  glucose blood test strip Use as instructed to check blood sugar 2 times daily 200 each 3  . insulin degludec (TRESIBA FLEXTOUCH) 200 UNIT/ML FlexTouch Pen Inject 8 Units into the skin daily. 3 mL 0  . Insulin Pen Needle (PEN NEEDLES) 32G X 4 MM MISC Use to inject into the skin daily 100 each 3  . Insulin Syringe-Needle U-100 (INSULIN SYRINGE .3CC/31GX5/16") 31G X 5/16" 0.3 ML MISC Use to inject insulin twice daily 200 each 3  . levothyroxine (SYNTHROID) 112 MCG tablet Take 1 tablet (112 mcg total) by mouth daily. 90 tablet 0  . Multiple Vitamins-Minerals (ICAPS AREDS 2) CAPS Take 1 capsule by mouth 2 (two) times daily.    . pantoprazole (PROTONIX) 40 MG tablet Take 1 tablet (40 mg total) by mouth daily. 90 tablet 3  . polyethylene glycol (MIRALAX / GLYCOLAX) 17 g packet Take 17 g by mouth as directed.    . potassium chloride (KLOR-CON) 10 MEQ tablet Take 2 tablets (36mEq) on day one. Then take 1 tablet (67mEq) once per day only when take Torsemide. 30 tablet 5  . pyridostigmine (MESTINON) 60 MG tablet TAKE 1/2 TABLET BY MOUTH BID - Please keep upcoming appt in July 2022 before anymore refills. Thanks (Patient taking differently: TAKE 1/2 TABLET BY MOUTH ONCE A DAY - Please keep upcoming appt in July 2022 before anymore refills. Thanks) 30 tablet 2  . Semaglutide, 2 MG/DOSE, (OZEMPIC, 2 MG/DOSE,) 8 MG/3ML SOPN Inject 2 mg into the skin once a week. 9 mL 3  . torsemide (DEMADEX) 10 MG tablet Take 1 tablet (10mg ) daily x 3 days, then take 10mg  daily AS NEEDED. 30 tablet 3  . vitamin B-12 (CYANOCOBALAMIN) 500 MCG tablet Take 500 mcg by mouth daily.     No current facility-administered medications on file prior to visit.    Review of Systems    She reports wt gain, bloating, and cramping and uncertain if mestinon is contributing.  She has GI symptoms with her abdominal bloating.  She reports  chest pain, shortness of breath, dyspnea.  She reports headache with elevated blood pressure.  She reports throat pain with her chest pain.  She also has diaphoresis.  She denies presyncope or syncope.  She reports lower extremity edema, alleviated with diaphoresis. No recent falls of syncopal events. No s/sx of bleeding.   All other systems reviewed and are otherwise negative except as noted above.  Physical Exam    VS:  BP (!) 160/92 (BP Location: Left Arm, Patient Position: Sitting, Cuff Size: Normal)   Pulse (!) 57   Ht 5\' 5"  (1.651 m)   Wt 180 lb (81.6 kg)   SpO2 98%   BMI 29.95 kg/m  , BMI Body mass index is 29.95 kg/m. GEN: Well nourished, well developed, in no acute distress. HEENT: normal. Neck: Supple, no JVD, carotid bruits, or masses. Cardiac: bradycardic but regular, 1/6 systolic murmur, rubs, or gallops. No clubbing, cyanosis, nonpitting bilateral edema.  Radials/DP/PT 2+ and equal bilaterally.   Respiratory:  Respirations regular and unlabored, clear to auscultation bilaterally. GI: Soft, nontender, nondistended, BS + x 4. MS: no deformity or atrophy. Skin: warm and dry, no rash. Neuro:  Strength and sensation are intact. Psych: Normal affect.  Accessory Clinical Findings    ECG personally reviewed by me today - 57bpm, av dual paced with QTc 577ms- no acute changes.  VITALS Reviewed today   Temp Readings from Last 3 Encounters:  06/22/20 97.7 F (36.5 C) (Oral)  03/20/19 Marland Kitchen)  97.5 F (36.4 C)  12/27/18 97.7 F (36.5 C) (Temporal)   BP Readings from Last 3 Encounters:  08/13/20 (!) 160/92  07/30/20 (!) 148/92  07/10/20 120/80   Pulse Readings from Last 3 Encounters:  08/13/20 (!) 57  07/30/20 77  07/10/20 (!) 55    Wt Readings from Last 3 Encounters:  08/13/20 180 lb (81.6 kg)  07/30/20 179 lb (81.2 kg)  07/10/20 182 lb (82.6 kg)     LABS  reviewed today   Lab Results  Component Value Date   WBC 7.0 06/19/2020   HGB 14.2 06/19/2020   HCT 41.8  06/19/2020   MCV 88.2 06/19/2020   PLT 213 06/19/2020   Lab Results  Component Value Date   CREATININE 1.28 (H) 07/31/2020   BUN 25 (H) 07/31/2020   NA 139 07/31/2020   K 4.1 07/31/2020   CL 106 07/31/2020   CO2 24 07/31/2020   Lab Results  Component Value Date   ALT 14 10/21/2018   AST 23 10/21/2018   ALKPHOS 172 (H) 10/21/2018   BILITOT 0.6 10/21/2018   Lab Results  Component Value Date   CHOL 224 (H) 09/17/2019   HDL 30 (L) 09/17/2019   LDLCALC 119 (H) 09/17/2019   LDLDIRECT 63 09/17/2019   TRIG 426 (H) 09/17/2019   CHOLHDL 7.5 (H) 09/17/2019    Lab Results  Component Value Date   HGBA1C 6.5 (A) 07/30/2020   Lab Results  Component Value Date   TSH 0.88 03/31/2020     STUDIES/PROCEDURES reviewed today   Echo 08/04/20 1. Left ventricular ejection fraction, by estimation, is 50 to 55%. The  left ventricle has low normal function. Left ventricular endocardial  border not optimally defined to evaluate regional wall motion. There is  mild left ventricular hypertrophy. Left  ventricular diastolic parameters are consistent with Grade II diastolic  dysfunction (pseudonormalization). Elevated left atrial pressure.  2. Right ventricular systolic function is normal. The right ventricular  size is normal. Tricuspid regurgitation signal is inadequate for assessing  PA pressure.  3. Left atrial size was mildly dilated.  4. The mitral valve is degenerative. Mild to moderate mitral valve  regurgitation. No evidence of mitral stenosis.  5. The aortic valve is tricuspid. There is moderate calcification of the  aortic valve. There is moderate thickening of the aortic valve. Aortic  valve regurgitation is trivial.  6. Aortic dilatation noted. There is mild dilatation of the ascending  aorta, measuring 37 mm.  7. Mildly dilated pulmonary artery.   [facility] 05/03/2017  Relatively normal right heart cath pressures with only mildly elevated LVEDP/PCP.  Prox LAD to Mid  LAD lesion is 45% stenosed. Mid LAD lesion is 40% stenosed. Neither lesion was flow-limiting  Lat 2nd Diag lesion is 70% stenosed. Vessel is way too small for PCI  Previously placed Ost 1st Mrg stent (unknown type) is widely patent. Angiographically moderate disease in the mid and distal LAD with patent stent to the circumflex.  Otherwise no significant disease. Mildly elevated LVEDP and PCWP with essentially normal pulmonary pressures. Adequately diuresed Suspect that patient's decompensation was related to A. fib with RVR and likely diastolic heart failure. Plan: Patient will return to nursing unit for ongoing care with TR band removal. Gentle hydration post cath Defer further plans to [facility] / EP service.  Echo 04/2017 Procedure narrative: Transthoracic echocardiography. Technically  difficult study with reduced echo windows.  - Left ventricle: The cavity size was normal. Wall thickness was  increased in  a pattern of mild LVH. Systolic function was normal.  The estimated ejection fraction was in the range of 50% to 55%.  Doppler parameters are consistent with abnormal left ventricular  relaxation (grade 1 diastolic dysfunction). The E/e&' ratio is  between 8-15, suggesting indeterminate LV filling pressure.  - Aortic valve: Trileaflet. Sclerosis without stenosis. There was  trivial regurgitation.  - Mitral valve: Mildly thickened leaflets . There was trivial  regurgitation.  - Left atrium: The atrium was normal in size.  - Inferior vena cava: The vessel was normal in size. The  respirophasic diameter changes were in the normal range (>= 50%),  consistent with normal central venous pressure.   Assessment & Plan    Acute on chronic diastolic heart failure --Reports ongoing abdominal bloating and wt gain. Wt today is down 2 pounds from her previous clinic visit.   Most recent echo shows EF 50 to 55%, mild LVH, G2 DD, mild LAE, mild to moderate MR,  moderate thickening of the aortic valve/calcification with trivial TR, mildly dilated pulmonary artery, ascending aorta are 37 mm.  2019 R/LHC performed with mildly elevated LVEDP/PCWP. Until 06/30/20, she had not been on a diuretic.  At her previous clinic visit, as needed Lasix was switched to torsemide that has been re-titrated several times due to bumps in her renal function between clinic visits.  Despite diuresis, she reports low urine output and now is experiencing exertional dyspnea and chest discomfort.  We discussed that this could be anginal symptoms due to coronary insufficiency, rather than volume status, and with recommendations for further ischemic work-up as below.  Obtain BMET today with further recommendations regarding diuresis pending repeat labs.  Return precautions provided today.  Coronary artery dz -- She reports exertional dyspnea and chest discomfort with frequently elevated BP.  2019 catheterization as above with pLAD to mLAD 45%s, m LAD 40%s.  Lateral second diagonal 70%s, though vessel too small for PCI.  Previously placed ostial first marginal stent was widely patent.  Most recent repeat echo with ongoing low normal EF 50 to 55%, though results limited by images and unable to assess if regional wall motion.  Given her most recent symptoms, concern for dyspnea/exertional chest pain due to coronary insufficiency was discussed with patient understanding.  Unfortunately, coronary CT cannot be obtained due to patient's cardiac device interfering with any imaging.  We discussed Lexiscan Myoview, though it has been noted that previous [employer] results were limited due to artifact.  At this time, patient preference is for MPI.  If ongoing symptoms, consider cardiac catheterization with right and left heart cath over that of MPI.  She does not have sublingual nitro at home, and this prescription was provided today.  No ASA given chronic OAC. Not on a BB with PRN Cardizem as above.   Prescription provided for as needed sublingual nitro today.  We discussed start of Imdur, though as she gets headaches with nitro, this is less ideal.  She will let us know if she gets recurrent headaches on sublingual nitro.  Aggressive risk factor modification recommended going forward, including initiation of LDL control as below / start of statin.  Praluent/Repatha also discussed today with patient preference to defer and instead repeat lipid and liver function today, which will be obtained.  Return precautions provided today given her symptoms as described above.  Atrial fibrillation s/p DCCV 06/22/20 --SR today as noted 06/30/20. She is s/p DCCV 06/22/20 with device report of Afib after cardioversion. Per Afib clinic notes,  her afib burden at baseline is ~8%, and if more persistent episodes, dofetilide admission could be considered in the future. Continue current PRN Cardizem for rates over 100bpm. Continue Eliquis 5mg  BID for anticoagulation with CHA2DS2VASc score of at least  7. No s/sx of bleeding.  Hypertension  -- BP suboptimal with patient report that BP has also been elevated at home.  Renal function has been unable to tolerate diuresis.  Not currently on ACE/ARB with comorbid HTN/DM2.  ACE/ARB previously deferred due to history of orthostasis and poor renal function. Will check BMET.  Consider ACE/ARB with ongoing BP.  If renal function is relatively stable, consider restart of torsemide as well.  Discussed start of Imdur, though she does report headaches on nitro, and we will trial as needed sublingual nitro to see if she still gets headaches.  Will attempt to avoid hydralazine, given the desire to avoid reflex tachycardia/BP with history of atrial fibrillation as above.  Will attempt to avoid clonidine due to associated rebound hypertension.  Orthostatic hypotension -- Previously on mestinon twice daily with patient request to come completely off of Mestinon today, as she has reduced down to  Mestinon once daily and reports ongoing elevated BP without any dizziness/presyncope or syncope.  We will trial off of this medication with recommendation that patient be very careful over the next few days and notify the office if any dizziness.  Hyperlipidemia, Hypertriglyceridemia --Most recent LDL not at goal and elevated Tg noted. Recommend discussion of statin/Zetia at RTC.  If LDL not at goal, we will need to discuss statin versus Zetia versus PCSK9 inhibitors at that time for aggressive risk factor modification.  Recheck lipid and liver function today.  SSS s/p PPM --S/p PPM. Continue to follow with EP as directed.  OSA --Continue CPAP.  DM2 --Defer to endocrinology.   CKD --Will check BMET today.  On review of her chart, previous history also shows renal insufficiency after diuresis in the past.  Mild Ao root dilation -- Discussed the recommendation for heart rate, BP, and cholesterol control.  Will recheck lipid and liver function today.  BP suboptimal with plan to control as above.  Avoid fluoroquinolones and heavy lifting.  Recommend annual echo to monitor.  Dilated pulmonary artery -- Discussed possible pulmonary referral today -we will provide today for patient to follow up with pulmonology today or in the near future.   ---------------------------------  Shared Decision Making/Informed Consent The risks [chest pain, shortness of breath, cardiac arrhythmias, dizziness, blood pressure fluctuations, myocardial infarction, stroke/transient ischemic attack, nausea, vomiting, allergic reaction, radiation exposure, metallic taste sensation and life-threatening complications (estimated to be 1 in 10,000)], benefits (risk stratification, diagnosing coronary artery disease, treatment guidance) and alternatives of a nuclear stress test were discussed in detail with Ms. Riedlinger and she agrees to proceed.  --------------------------  Medication changes:  --Per patient request, trial  off of Mestinon.   --PRN SL nitro--we will see if she gets a headache on this medication, given Imdur would be a helpful medication for her. --Further medication recommendations pending labs.  Labs ordered:  --BMET.   --Lipid/liver.   Studies / Imaging ordered:   --Referral to pulmonology (dilated pulmonary artery). --MPI. Future considerations:  --Statin/Zetia/PCSK9 inhibitor given LDL/Tg above. --If renal function allows, restart torsemide.   --Consider ACE/ARB.  Consider Imdur if she does not get a headache on as needed sublingual nitro. -- She will let us know if any worsening CP/dyspnea, at which time [facility] should be considered over that of stress  test given previous stress test results have been suboptimal due to GI artifact. Disposition:  --RTC after MPI  *Please be aware that the above documentation was completed voice recognition software and may contain dictation errors.      Arvil Chaco, PA-C 08/13/2020

## 2020-08-14 LAB — COMPREHENSIVE METABOLIC PANEL
ALT: 11 IU/L (ref 0–32)
AST: 22 IU/L (ref 0–40)
Albumin/Globulin Ratio: 1.7 (ref 1.2–2.2)
Albumin: 4.3 g/dL (ref 3.7–4.7)
Alkaline Phosphatase: 168 IU/L — ABNORMAL HIGH (ref 44–121)
BUN/Creatinine Ratio: 16 (ref 12–28)
BUN: 21 mg/dL (ref 8–27)
Bilirubin Total: 0.4 mg/dL (ref 0.0–1.2)
CO2: 23 mmol/L (ref 20–29)
Calcium: 9.2 mg/dL (ref 8.7–10.3)
Chloride: 105 mmol/L (ref 96–106)
Creatinine, Ser: 1.3 mg/dL — ABNORMAL HIGH (ref 0.57–1.00)
Globulin, Total: 2.6 g/dL (ref 1.5–4.5)
Glucose: 105 mg/dL — ABNORMAL HIGH (ref 65–99)
Potassium: 4.4 mmol/L (ref 3.5–5.2)
Sodium: 142 mmol/L (ref 134–144)
Total Protein: 6.9 g/dL (ref 6.0–8.5)
eGFR: 42 mL/min/{1.73_m2} — ABNORMAL LOW (ref >59)

## 2020-08-14 LAB — LIPID PANEL
Chol/HDL Ratio: 4.6 ratio — ABNORMAL HIGH (ref 0.0–4.4)
Cholesterol, Total: 196 mg/dL (ref 100–199)
HDL: 43 mg/dL (ref >39)
LDL Chol Calc (NIH): 115 mg/dL — ABNORMAL HIGH (ref 0–99)
Triglycerides: 219 mg/dL — ABNORMAL HIGH (ref 0–149)
VLDL Cholesterol Cal: 38 mg/dL (ref 5–40)

## 2020-08-18 ENCOUNTER — Telehealth: Payer: Self-pay

## 2020-08-18 DIAGNOSIS — R7989 Other specified abnormal findings of blood chemistry: Secondary | ICD-10-CM

## 2020-08-18 DIAGNOSIS — J449 Chronic obstructive pulmonary disease, unspecified: Secondary | ICD-10-CM

## 2020-08-18 DIAGNOSIS — E782 Mixed hyperlipidemia: Secondary | ICD-10-CM

## 2020-08-18 DIAGNOSIS — G4733 Obstructive sleep apnea (adult) (pediatric): Secondary | ICD-10-CM

## 2020-08-18 NOTE — Telephone Encounter (Signed)
Patient calling and states repatha prior Josem Kaufmann is in progress and she does want to take this medication.    She will know soon if PA approved .

## 2020-08-18 NOTE — Telephone Encounter (Signed)
MyChart message sent in regards to Blanche East, PA-C recommendations  Results discussed with pt via MyChart over the weekend.  Please ensure she gets a BMET early this week to recheck her renal function with restart of diuretic.  We also discussed a referral to pulmonology   Repeat BMET order Referral placed to pulmonology placed

## 2020-08-18 NOTE — Telephone Encounter (Signed)
Noted. Thank you for the update.

## 2020-08-18 NOTE — Telephone Encounter (Signed)
To [name], PA to review.

## 2020-08-18 NOTE — Telephone Encounter (Signed)
Attempted to reach pt via phone, unable to make contact, LDM on VM (DPR approved) regarding her Lipid results and recommendations by Mickle Plumb, PA-C  LDL not at goal. Dr. Caryl Comes has recommended Crestor in the past. If she tolerated this in the past, please start Crestor 20mg  daily with repeat lipid and liver in 6-8 weeks. If she has not tolerated this, please start Repatha with repeat lipid and liver in 6-8 weeks.   MyChart message also sent. Will wait for pt's response or return phone call.

## 2020-08-18 NOTE — Telephone Encounter (Signed)
Able to reach pt regarding her recent lab work, Blanche East, PA-C had a chance to review her results and advised   "LDL not at goal. Dr. Caryl Comes has recommended Crestor in the past. If she tolerated this in the past, please start Crestor 20mg  daily with repeat lipid and liver in 6-8 weeks. If she has not tolerated this, please start Repatha with repeat lipid and liver in 6-8 weeks."  Mrs. Mutz reports had seen her MyChart message regarding results and recommendations, she reports "statin drugs does not do well with me, have tried several in the past they just don't sit right with me", pt reports "memory, fogginess, and head issues with statin drugs".   Reports unsure regarding Repatha, states "I'm already in the donut hole for the year, me and my husband are on fixed income, I already pay a lot out-of-pocket for my diabetes medications, and I do not qualify for any assistance or aide to help with my medications d/t social security and retirement checks" Mrs. Heyne asking about additional medication other then satin therapy or Repatha.   Does report will call her insurance company to see about cost of [place] and if they are able to help with coverage/co-pay.   Labs will will be repeated once a therapy medication can be determine.   Will forward message to [employer], PA-C for review to see her thoughts on alternative plan of care. Pt will call back once spoke with insurance.   Order for repeat lipid and liver panels already placed for 6-8 weeks out.

## 2020-08-19 ENCOUNTER — Other Ambulatory Visit: Payer: Self-pay | Admitting: Internal Medicine

## 2020-08-19 ENCOUNTER — Telehealth: Payer: Self-pay | Admitting: Physician Assistant

## 2020-08-19 DIAGNOSIS — I4819 Other persistent atrial fibrillation: Secondary | ICD-10-CM

## 2020-08-19 DIAGNOSIS — G4733 Obstructive sleep apnea (adult) (pediatric): Secondary | ICD-10-CM

## 2020-08-19 NOTE — Telephone Encounter (Signed)
Visser, Jacquelyn D, PA-C  P C[facility] [place] Triage Please place a referral for this pt to see Dr. Radford Pax in [place] for her CPAP. She has requested this referral, as she wants to make sure her device is functioning properly and she is appropriately treating her sleep apnea.   Thanks!

## 2020-08-19 NOTE — Telephone Encounter (Signed)
Referral to Dr. Radford Pax for dx OSA placed as requested.

## 2020-08-20 ENCOUNTER — Other Ambulatory Visit: Payer: Self-pay | Admitting: Physician Assistant

## 2020-08-20 ENCOUNTER — Other Ambulatory Visit
Admission: RE | Admit: 2020-08-20 | Discharge: 2020-08-20 | Disposition: A | Discharge status: Home / Self Care | Payer: Medicare Other | Attending: Physician Assistant | Admitting: Physician Assistant

## 2020-08-20 DIAGNOSIS — I10 Essential (primary) hypertension: Secondary | ICD-10-CM

## 2020-08-20 DIAGNOSIS — I5033 Acute on chronic diastolic (congestive) heart failure: Secondary | ICD-10-CM

## 2020-08-20 DIAGNOSIS — E876 Hypokalemia: Secondary | ICD-10-CM

## 2020-08-20 DIAGNOSIS — E782 Mixed hyperlipidemia: Secondary | ICD-10-CM

## 2020-08-20 DIAGNOSIS — E1159 Type 2 diabetes mellitus with other circulatory complications: Secondary | ICD-10-CM

## 2020-08-20 DIAGNOSIS — R7989 Other specified abnormal findings of blood chemistry: Secondary | ICD-10-CM | POA: Diagnosis present

## 2020-08-20 LAB — BASIC METABOLIC PANEL
Anion gap: 8 (ref 5–15)
BUN: 27 mg/dL — ABNORMAL HIGH (ref 8–23)
CO2: 24 mmol/L (ref 22–32)
Calcium: 9 mg/dL (ref 8.9–10.3)
Chloride: 108 mmol/L (ref 98–111)
Creatinine, Ser: 1.33 mg/dL — ABNORMAL HIGH (ref 0.44–1.00)
GFR, Estimated: 41 mL/min — ABNORMAL LOW (ref >60)
Glucose, Bld: 177 mg/dL — ABNORMAL HIGH (ref 70–99)
Potassium: 3.9 mmol/L (ref 3.5–5.1)
Sodium: 140 mmol/L (ref 135–145)

## 2020-08-20 LAB — HEPATIC FUNCTION PANEL
ALT: 13 U/L (ref 0–44)
AST: 26 U/L (ref 15–41)
Albumin: 3.8 g/dL (ref 3.5–5.0)
Alkaline Phosphatase: 129 U/L — ABNORMAL HIGH (ref 38–126)
Bilirubin, Direct: <0.1 mg/dL (ref 0.0–0.2)
Total Bilirubin: 1.1 mg/dL (ref 0.3–1.2)
Total Protein: 6.5 g/dL (ref 6.5–8.1)

## 2020-08-20 LAB — LIPID PANEL
Cholesterol: 219 mg/dL — ABNORMAL HIGH (ref 0–200)
HDL: 41 mg/dL (ref >40)
LDL Cholesterol: 124 mg/dL — ABNORMAL HIGH (ref 0–99)
Total CHOL/HDL Ratio: 5.3 RATIO
Triglycerides: 269 mg/dL — ABNORMAL HIGH (ref <150)
VLDL: 54 mg/dL — ABNORMAL HIGH (ref 0–40)

## 2020-08-20 MED ORDER — LOSARTAN POTASSIUM 25 MG PO TABS: 25.0000 mg | ORAL_TABLET | Freq: Every day | ORAL | 0 refills | Status: DC

## 2020-08-20 MED ORDER — POTASSIUM CHLORIDE ER 10 MEQ PO TBCR: 10.0000 meq | EXTENDED_RELEASE_TABLET | Freq: Every day | ORAL | 5 refills | Status: DC

## 2020-08-20 MED ORDER — TORSEMIDE 10 MG PO TABS: 10.0000 mg | ORAL_TABLET | Freq: Every day | ORAL | 3 refills | Status: DC

## 2020-08-20 NOTE — Telephone Encounter (Signed)
Lmovm to verify if I could assist on additional questions or information.

## 2020-08-20 NOTE — Progress Notes (Signed)
Losartan 25mg  daily prescribed for elevated BP with DM2. If Losartan not covered well with her insurance plan, we will change to olmesartan 20mg  daily.    Torsemide and KCL tab updated to reflect that she is taking these both on a daily basis.

## 2020-08-20 NOTE — Telephone Encounter (Signed)
[employer] calling to discuss PA and have additional questions.    Complete and return fax previously sent or call and discuss via phone   Key #  [id number]

## 2020-08-21 ENCOUNTER — Other Ambulatory Visit: Payer: Self-pay | Admitting: *Deleted

## 2020-08-21 ENCOUNTER — Telehealth: Payer: Self-pay | Admitting: *Deleted

## 2020-08-21 MED ORDER — REPATHA 140 MG/ML ~~LOC~~ SOSY: 140.0000 mg | PREFILLED_SYRINGE | SUBCUTANEOUS | 12 refills | Status: DC

## 2020-08-21 NOTE — Telephone Encounter (Signed)
Left voicemail message to call back for review of results and recommendations.  

## 2020-08-21 NOTE — Telephone Encounter (Signed)
Repatha prescription had not been entered so not sure how prior authorization was started before that. Order has been placed and will send over for review

## 2020-08-21 NOTE — Telephone Encounter (Signed)
-----   Message from Arvil Chaco, PA-C sent at 08/20/2020 10:05 PM EDT ----- Labs show --Renal function stable from previous lab with daily torsemide.  --Continue torsemide 10mg  qd, as labs confirm Cr will improve with diuresis.  --Start Losartan 25mg  daily for extra BP support. This is recommended in those with high BP and glucose, and it will protect her kidneys and heart from remodeling.  --She should reach out to her PCP to ensure her glucose is properly controlled, given it has been elevated on her labs (glucose 177).  --Cholesterol labs show elevated cholesterol / LDL with recommendation for start of Repatha, given her statin intolerance, and as already recommended. Please let me know if we are still having trouble getting this covered for her (chart reviewed and looks like this is still in process). Liver function wnl.

## 2020-08-21 NOTE — Telephone Encounter (Signed)
I spoke to [facility] they will be refaxing additional information request to our office. Fax pending not yet received once received needs to faxed asap.

## 2020-08-21 NOTE — Telephone Encounter (Signed)
Spoke with patient and reviewed results and recommendations. Advised that she wants her to start Losartan 25 mg once daily. Reviewed she wanted her to check with primary care provider to monitor her glucose. She reports that she has been seen by endocrinology and is currently on insulin with recent A1C of 6.2. Repatha was denied but we are working on the prior authorization and will be in touch about that. Reassured her that I will follow up with staff that works on those. She verbalized understanding of our conversation, agreement with plan, and had no further questions at this time.

## 2020-08-21 NOTE — Telephone Encounter (Signed)
[employer] returning call re PA

## 2020-08-24 ENCOUNTER — Telehealth: Payer: Self-pay

## 2020-08-24 NOTE — Telephone Encounter (Signed)
PA started through Covermymeds  Dawn Garcia (Key: [id number])  Your information has been submitted to [place]. [employer] [place] will review the request and notify you of the determination decision directly, typically within 3 business days of your submission and once all necessary information is received.  You will also receive your request decision electronically. To check for an update later, open the request again from your dashboard.  If [employer] [place] has not responded within the specified timeframe or if you have any questions about your PA submission, contact [place] directly at [facility]) [phone] or ([place]) [phone].

## 2020-08-24 NOTE — Telephone Encounter (Signed)
Per covermymeds  Dawn Garcia (Key: [id number])  This request has received a Cancelled outcome.  This may mean either your patient does not have active coverage with this plan, this authorization was processed as a duplicate request, or an authorization was not needed for this medication.  Note any additional information provided by [facility] at the bottom of this request, and contact [employer]  directly for further details.

## 2020-08-24 NOTE — Telephone Encounter (Signed)
Per Dawn Garcia at [place] [facility] [place], Repatha PA was cancelled because it was a duplicate. She states original PA initiated under Dr. Caryl Garcia was denied on 08/24/2020 because she contacted our office several times on 6/10 but was unable to speak with anyone who could provide answers to clinical questions. She states they will not accept another PA for 60 days however our office is able to appeal their decision. She then transferred me to the Appeals Department at which time I spoke with Dawn B. to initiate and expedite the appeal. Determination should be received within 72 hours of their decision.

## 2020-08-24 NOTE — Telephone Encounter (Signed)
[facility] calling to check the status of Repatha prior auth Please call [phone]

## 2020-08-24 NOTE — Telephone Encounter (Signed)
PA started through covermymeds.  Another phone note started

## 2020-08-25 NOTE — Telephone Encounter (Signed)
Spoke with patient and updated that prior authorization has been approved. She acknowledged that she had heard this information and was thankful for our efforts.

## 2020-08-25 NOTE — Telephone Encounter (Signed)
Spoke with Abigail Butts at [place] [facility] [place] who states Repatha has been approved through 08/25/2021. Approval #[id number]

## 2020-08-25 NOTE — Telephone Encounter (Signed)
Returned call to [place].  She asked if patient will be taking both Repatha and Praluent.   Made her aware that we only have Repatha on patients medication list and that's the one she is to take.  She was thankful for the return call.

## 2020-08-25 NOTE — Telephone Encounter (Signed)
Dawn Garcia with [employer] following up please call & assist asap.

## 2020-08-26 ENCOUNTER — Telehealth: Payer: Self-pay

## 2020-08-26 ENCOUNTER — Ambulatory Visit
Admission: RE | Admit: 2020-08-26 | Discharge: 2020-08-26 | Disposition: A | Discharge status: Home / Self Care | Payer: Medicare Other | Source: Ambulatory Visit | Attending: Physician Assistant | Admitting: Physician Assistant

## 2020-08-26 ENCOUNTER — Other Ambulatory Visit: Payer: Self-pay

## 2020-08-26 ENCOUNTER — Encounter (HOSPITAL_BASED_OUTPATIENT_CLINIC_OR_DEPARTMENT_OTHER)
Admission: RE | Admit: 2020-08-26 | Discharge: 2020-08-26 | Disposition: A | Discharge status: Home / Self Care | Payer: Medicare Other | Source: Ambulatory Visit | Attending: Physician Assistant | Admitting: Physician Assistant

## 2020-08-26 DIAGNOSIS — R0600 Dyspnea, unspecified: Secondary | ICD-10-CM

## 2020-08-26 DIAGNOSIS — I25118 Atherosclerotic heart disease of native coronary artery with other forms of angina pectoris: Secondary | ICD-10-CM | POA: Diagnosis not present

## 2020-08-26 DIAGNOSIS — R0609 Other forms of dyspnea: Secondary | ICD-10-CM

## 2020-08-26 MED ORDER — TECHNETIUM TC 99M TETROFOSMIN IV KIT: 10.4900 | PACK | Freq: Once | INTRAVENOUS | Status: DC | PRN

## 2020-08-26 NOTE — Telephone Encounter (Signed)
Christell Faith, PA secure chatted me regarding patient:   "Dawn Garcia an [name], can one of you call this patient to have her take her losartan this morning, get her Lexiscan rescheduled for tomorrow AM and have her take losartan prior to coming over for the test in the AM?"  Called patient and made her aware of Christell Faith, PA advice above.   Patient states as soon as she gets home she will take her Losartan.  Patients Lexi Scan is rescheduled for Tomorrow (08/27/2020) @ 9:00 and patient stated she would take her Losartan prior to heading this way.

## 2020-08-27 ENCOUNTER — Encounter
Admission: RE | Admit: 2020-08-27 | Discharge: 2020-08-27 | Disposition: A | Discharge status: Home / Self Care | Payer: Medicare Other | Source: Ambulatory Visit | Attending: Physician Assistant | Admitting: Physician Assistant

## 2020-08-27 ENCOUNTER — Other Ambulatory Visit: Payer: Medicare Other

## 2020-08-27 DIAGNOSIS — R0600 Dyspnea, unspecified: Secondary | ICD-10-CM | POA: Diagnosis not present

## 2020-08-27 LAB — NM MYOCAR MULTI W/SPECT W/WALL MOTION / EF
LV dias vol: 116 mL (ref 46–106)
LV sys vol: 43 mL
Peak HR: 77 {beats}/min
Percent HR: 54 %
Rest HR: 45 {beats}/min
SDS: 1
SRS: 10
SSS: 2
TID: 1.01

## 2020-08-27 MED ORDER — REGADENOSON 0.4 MG/5ML IV SOLN
0.4000 mg | Freq: Once | INTRAVENOUS | Status: AC
  Administered 2020-08-27: 0.4 mg via INTRAVENOUS
  Filled 2020-08-27: qty 5

## 2020-08-27 MED ORDER — TECHNETIUM TC 99M TETROFOSMIN IV KIT
30.2700 | PACK | Freq: Once | INTRAVENOUS | Status: AC | PRN
  Administered 2020-08-27: 30.27 via INTRAVENOUS

## 2020-08-31 ENCOUNTER — Ambulatory Visit: Payer: Medicare Other | Admitting: Physician Assistant

## 2020-08-31 ENCOUNTER — Encounter: Payer: Self-pay | Admitting: Physician Assistant

## 2020-08-31 ENCOUNTER — Other Ambulatory Visit: Payer: Self-pay

## 2020-08-31 VITALS — BP 168/84 | HR 54 | Ht 62.0 in | Wt 179.0 lb

## 2020-08-31 DIAGNOSIS — I77810 Thoracic aortic ectasia: Secondary | ICD-10-CM

## 2020-08-31 DIAGNOSIS — I10 Essential (primary) hypertension: Secondary | ICD-10-CM

## 2020-08-31 DIAGNOSIS — G4733 Obstructive sleep apnea (adult) (pediatric): Secondary | ICD-10-CM

## 2020-08-31 DIAGNOSIS — I340 Nonrheumatic mitral (valve) insufficiency: Secondary | ICD-10-CM

## 2020-08-31 DIAGNOSIS — I25110 Atherosclerotic heart disease of native coronary artery with unstable angina pectoris: Secondary | ICD-10-CM

## 2020-08-31 DIAGNOSIS — I951 Orthostatic hypotension: Secondary | ICD-10-CM

## 2020-08-31 DIAGNOSIS — N183 Chronic kidney disease, stage 3 unspecified: Secondary | ICD-10-CM

## 2020-08-31 DIAGNOSIS — I272 Pulmonary hypertension, unspecified: Secondary | ICD-10-CM

## 2020-08-31 DIAGNOSIS — J449 Chronic obstructive pulmonary disease, unspecified: Secondary | ICD-10-CM

## 2020-08-31 DIAGNOSIS — E785 Hyperlipidemia, unspecified: Secondary | ICD-10-CM

## 2020-08-31 DIAGNOSIS — I495 Sick sinus syndrome: Secondary | ICD-10-CM

## 2020-08-31 DIAGNOSIS — I480 Paroxysmal atrial fibrillation: Secondary | ICD-10-CM | POA: Diagnosis not present

## 2020-08-31 DIAGNOSIS — Z789 Other specified health status: Secondary | ICD-10-CM

## 2020-08-31 DIAGNOSIS — R079 Chest pain, unspecified: Secondary | ICD-10-CM

## 2020-08-31 DIAGNOSIS — I5033 Acute on chronic diastolic (congestive) heart failure: Secondary | ICD-10-CM

## 2020-08-31 DIAGNOSIS — E781 Pure hyperglyceridemia: Secondary | ICD-10-CM

## 2020-08-31 DIAGNOSIS — Z950 Presence of cardiac pacemaker: Secondary | ICD-10-CM

## 2020-08-31 DIAGNOSIS — Z79899 Other long term (current) drug therapy: Secondary | ICD-10-CM

## 2020-08-31 DIAGNOSIS — R0600 Dyspnea, unspecified: Secondary | ICD-10-CM

## 2020-08-31 NOTE — Patient Instructions (Addendum)
Medication Instructions:  Your physician recommends that you continue on your current medications as directed. Please refer to the Current Medication list given to you today.  *If you need a refill on your cardiac medications before your next appointment, please call your pharmacy*   Lab Work:  BMET, CBC today  If you have labs (blood work) drawn today and your tests are completely normal, you will receive your results only by: [place] (if you have MyChart) OR A paper copy in the mail If you have any lab test that is abnormal or we need to change your treatment, we will call you to review the results.   Testing/Procedures:   You are scheduled for a Cardiac Catheterization on Friday, July 8 with Dr. Harrell Gave End.  1. Please arrive at the [place] [facility] at 8:30 AM (This time is one hour before your procedure to ensure your preparation). Free valet parking service is available.   Special note: Every effort is made to have your procedure done on time. Please understand that emergencies sometimes delay scheduled procedures.  2. Diet: Do not eat solid foods after midnight.  The patient may have clear liquids until 5am upon the day of the procedure.  3. Labs: BMET, CBC drawn today in office.   4. Medication instructions in preparation for your procedure:   Contrast Allergy: No  -  HOLD Eliquis 2 days prior to procedure:  Take last dose 09/15/20. Do not take 09/16/20, 09/17/20 or morning of procedure. You will be instructed at discharge when to resume.   -  Take HALF dose insulin the evening prior to procedure (If you take insulin in evening)  -  DO NOT take Insulin morning of procedure  - DO NOT take Torsemide morning of procedure  On the morning of your procedure, take your morning medicines NOT listed above.  You may use sips of water.  5. Plan for one night stay--bring personal belongings. 6. Bring a current list of your medications and current insurance cards. 7.  You MUST have a responsible person to drive you home. 8. Someone MUST be with you the first 24 hours after you arrive home or your discharge will be delayed. 9. Please wear clothes that are easy to get on and off and wear slip-on shoes.  Thank you for allowing [place] to care for you!   -- [facility]    Follow-Up: At [facility], you and your health needs are our priority.  As part of our continuing mission to provide you with exceptional heart care, we have created designated Provider Care Teams.  These Care Teams include your primary Cardiologist (physician) and Advanced Practice Providers (APPs -  Physician Assistants and Nurse Practitioners) who all work together to provide you with the care you need, when you need it.  We recommend signing up for the patient portal called "MyChart".  Sign up information is provided on this After Visit Summary.  MyChart is used to connect with patients for Virtual Visits (Telemedicine).  Patients are able to view lab/test results, encounter notes, upcoming appointments, etc.  Non-urgent messages can be sent to your provider as well.   To learn more about what you can do with MyChart, go to [email or web address].    Your next appointment:    Follow up after cath procedure  The format for your next appointment:   In Person    Other Instructions  We recommend a maximum of 2g sodium per day and  2L total fluid per day. Fluids include coffee, tea, water, and juice.  In addition, we recommend you monitor both your daily weight and daily BP at the same time each day - bring this long into the office.

## 2020-08-31 NOTE — H&P (View-Only) (Signed)
Office Visit    Patient Name: [facility] Date of Encounter: 08/31/2020  PCP:  Perrin Maltese, MD   [place]  Cardiologist: Dr. Saunders Revel Advanced Practice Provider:  No care team member to display Electrophysiologist: Dr. Caryl Comes  Chief Complaint    Chief Complaint  Patient presents with   Follow-up    Follow up to review test results. Medications verbally reviewed with patient.      80 y.o. female with history of CAD with remote stent and patent stent by catheterization in 2014, paroxysmal atrial fibrillation s/p reported ablation in 2013 and repeat atrial fibrillation ablation 0/93/8182, chronic diastolic CHF, COPD, CKD, DM2, orthostatic hypotension, hyperlipidemia, hypothyroidism, reported history of bradycardia / 5-second pauses on cardiac monitoring with dual-chamber 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 for sick sinus syndrome/tachybradycardia syndrome and RA lead revision due to dislodgment, OSA, and here today for follow-up of recent stress test.  Past Medical History    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[place])    Arthritis    "all over; particularly in hands/fingers; all my joints" (10/05/2017)   Atrial fibrillation (HCC)    CAD (coronary artery disease)    CHF (congestive heart failure) ([place])     A-Fib   Chronic kidney disease    COPD (chronic obstructive pulmonary disease) ([place])    "very mild" (10/05/2017)   Family history of adverse reaction to anesthesia    "daughter w/PONV & woke up during endoscopy" (10/05/2017)   GERD (gastroesophageal reflux disease)    Gout    Graves' disease    S/P "radioactive tx"   Heart disease    History of blood transfusion    "related to shoulder replacement"    History of gout    "no longer on daily RX" (10/05/2017)   History of hiatal hernia    Hyperlipidemia    Hypertension    Hypocalcemia    Hypokalemia     Hypothyroidism    Macular degeneration    Melanoma ([place]) ~ 2012   "off my back" (10/05/2017)   Myocardial infarction [facility])    "was told I'd had one; don't really know when" (10/05/2017)   Orthostatic hypotension 2020   OSA on CPAP    Pacemaker    Presence of permanent cardiac 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   Renal disorder    Scarring of lung 04/2017   "found by radiology" (10/05/2017)   Sinus node dysfunction (HCC)    Type II diabetes mellitus ([place])    Vitamin B12 deficiency    Vitamin D deficiency    Past Surgical History:  Procedure Laterality Date   APPENDECTOMY     ATRIAL FIBRILLATION ABLATION N/A 10/05/2017   Procedure: ATRIAL FIBRILLATION ABLATION;  Surgeon: Constance Haw, MD;  Location: [facility];  Service: Cardiovascular;  Laterality: N/A;   ATRIAL FIBRILLATION ABLATION  ~ 2014   CARDIAC CATHETERIZATION  04/2017   CARDIOVERSION N/A 06/22/2020   Procedure: CARDIOVERSION;  Surgeon: Nelva Bush, MD;  Location: [facility];  Service: Cardiovascular;  Laterality: N/A;   COLONOSCOPY W/ BIOPSIES AND POLYPECTOMY  2015   1 polyp removed- repeat 3 years   CORONARY ANGIOPLASTY WITH STENT PLACEMENT  ~ 2014   EP IMPLANTABLE DEVICE N/A 11/10/2015   Procedure: Pacemaker Implant;  Surgeon: Will [name], MD;  Location: [facility];  Service: Cardiovascular;  Laterality: N/A;   EP IMPLANTABLE DEVICE N/A  11/11/2015   Procedure: Lead Revision/Repair;  Surgeon: Evans Lance, MD;  Location: [facility];  Service: Cardiovascular;  Laterality: N/A;   INGUINAL HERNIA REPAIR Left 1951   JOINT REPLACEMENT     LAPAROSCOPIC CHOLECYSTECTOMY     MELANOMA EXCISION  ~ 2012   "off my back" (10/05/2017)   PARATHYROIDECTOMY     "removed 3 glands; still have 1 gland" (10/05/2017)   RIGHT/LEFT HEART CATH AND CORONARY ANGIOGRAPHY N/A 05/03/2017   Procedure: RIGHT/LEFT HEART CATH AND CORONARY ANGIOGRAPHY;  Surgeon: Leonie Man, MD;  Location: [facility];  Service:  Cardiovascular;  Laterality: N/A;   TONSILLECTOMY     TOTAL SHOULDER ARTHROPLASTY Right x 2   VAGINAL HYSTERECTOMY     "total"    Allergies  Allergies  Allergen Reactions   Demerol [Meperidine] Anaphylaxis    Tolerated Fentanyl 11/10/15   Epinephrine Shortness Of Breath and Palpitations    Powder   Multaq [Dronedarone] Diarrhea    Kidney failure and lung problems   Sulfa Antibiotics Anaphylaxis   Tetracyclines & Related Swelling and Other (See Comments)    Made nose, lips,  And tongue itchy   Quinolones Other (See Comments)    Aortic dilation. Avoid FQ, as these are showing to increase Ao dilation.     History of Present Illness    Dawn Garcia is a 80 y.o. female with PMH as above.  She is followed by the atrial fibrillation clinic in [place], as well as Dr. Caryl Comes.  She has a long history of atrial fibrillation with ablation in [place] in 2013 and repeat ablation by Dr. Curt Bears in 2019.  She was on amiodarone in the past but reported this was discontinued due to development of lung issues.  She did not tolerate Multaq.    She underwent DCCV 06/22/2020.   The device clinic received an alert for increased atrial fibrillation burden 06/26/2020.   When seen 06/30/2020, she was back in sinus rhythm.  Lasix was prescribed for wt gain and abdominal bloating.  She has been maintained on anticoagulation with Eliquis 5 mg twice daily and denies any bleeding issues on anticoagulation.  Seen 07/10/2020 with report of intermittent dieting for DM2 and regular follow-up with endocrinology.  She felt volume up and reported only receipt of a few tablets of Lasix at a previous clinic visit.  She reports taking 20 mg of Lasix daily then 40 mg of Lasix.  She was bloated and felt as if she is holding onto fluid in her stomach.  She reported abdominal aches that were not necessarily connected with food.  She wondered if her Mestinon was contributing.  She reported weight gain and at times reduced  appetite though with consideration of intermittent fasting.  Home weight 1 7171 pounds and most recently 176.7 pounds.  She was using her CPAP.  She reported home SBP 120-140 and DBP 50 to 80s.  Lasix was changed to torsemide.  Between visits, she reported ongoing weight gain and feelings of being volume up.  Several attempts to increase volume output were made with patient report that she was still holding onto volume and not having very much volume output.  As a result, echo obtained with results as below showing EF 50 to 55%, mild LVH, G2 DD, elevated left atrial pressure, mild LAE, mild to moderate MR with degenerative mitral valve, moderate calcification of the aortic valve with trivial AR, mild dilation of the aorta at 37 mm, mildly dilated pulmonary  artery.  Seen 08/13/2020 and reported wt gain, bloating, and cramping and uncertain if mestinon was contributing.  Had has GI symptoms with her abdominal bloating.  She noted chest pain, shortness of breath, dyspnea.  She had headache with elevated blood pressure.  She had throat pain with her chest pain.  She also had diaphoresis.  She reported bilateral lower extremity edema, alleviated with diaphoresis. Home BP 177/98 and repeat BP 183/103.   She reported weight as high as 177 pounds.  In clinic, BP 160/92 with HR 57 bpm and oxygen saturation 98%.  Weight 180 pounds.  She wonders what will happen if she goes down on her Mestinon, as she wonders if this is contributing to her elevated blood pressure.  She had recently learned about a family history of heart disease in her father and is trying to find out more information regarding this history.  He died at the age of 48.  She was uncertain if this was due to AVM versus thrombus.  She reported some personal stressors, including no surrounding her husband who has Alzheimer's. She r was not taking her torsemide at time though did note smaller ankles with diuresis.  She was taking as needed MiraLAX for  constipation. Recommendation was for MPI.  Subsequent recommendation was also for Repatha and referral to sleep apnea clinic.  MPI showed no significant ischemia.  A small region of fixed defect in the mid to distal anterior septal and apical region was consistent with prior MI.  GI uptake was noted.  Hypokinesis in the mid to distal anterior septal wall was noted.  EF estimated at 57%.  Overall, this was ruled a low risk scan.  Today, 08/31/2020, she returns to clinic and notes that she overall feels improved from her previous visits.  Her abdominal bloating and headache has improved since switching to torsemide and increasing frequency to daily dosing of torsemide.  She continues to have episodes of chest pain that feels like an ache and is located in the center of the chest.  It is usually on the right side/right breast.  Chest pain can last up to an hour or more.  She has taken sublingual nitro since her last visit and if experiencing chest pain with report this nitro does help by the second dose.  With nitro, chest pain is alleviated within 10 to 15 minutes.  Since her previous visits,  her headache and breathing has improved.  Since starting torsemide, she has noted resolution of her abdominal bloating and that she is breathing easier.  She feels as if she is urinating more freely, or rather with a more steady stream and more urine output each time.  She has some periods of lightheadedness/presyncope.  No tachypalpitations.  BP at home 175/87, 177/92, 151/85, 191/117.  Rates well controlled at 72 bpm.  She reports home weight 176 to 177 pounds.  She is hopeful to find out more regarding her family history by July 4 of this year.  Is no longer taking her mestinon.  Stress test reviewed, as well as ongoing chest pain with recommendations as below.  No signs or symptoms of bleeding.  She reports medication compliance and does now have her Repatha.,  Patient request today to make Dr. Caryl Comes her primary  cardiologist.  Discussed with EP nurse with request that patient also choose a general cardiologist.  Patient requests to change her general cardiologist/primary cardiologist to Dr. Harrell Gave End today.  Home Medications    Current Outpatient Medications on  File Prior to Visit  Medication Sig Dispense Refill   acetaminophen (TYLENOL) 650 MG CR tablet Take 650 mg by mouth at bedtime.     apixaban (ELIQUIS) 5 MG TABS tablet Take 1 tablet (5 mg total) by mouth 2 (two) times daily. 180 tablet 1   Ascorbic Acid (VITAMIN C PO) Take 500 mg by mouth daily.     carboxymethylcellulose (REFRESH PLUS) 0.5 % SOLN Place 1 drop into both eyes daily as needed (dry eyes).     Cholecalciferol (VITAMIN D3) 5000 units TABS Take 5,000 Units by mouth every morning.     Continuous Blood Gluc Receiver (DEXCOM G6 RECEIVER) DEVI Use as instructed to check blood sugar daily. 1 each 1   Continuous Blood Gluc Sensor (DEXCOM G6 SENSOR) MISC Use as instructed to check blood sugar daily 9 each 3   Continuous Blood Gluc Transmit (DEXCOM G6 TRANSMITTER) MISC Use as instructed to check blood sugar. 1 each 3   diltiazem (CARDIZEM) 30 MG tablet Take 1 tablet every 4 hours AS NEEDED for heart rate >100 30 tablet 1   docusate sodium (COLACE) 100 MG capsule Take 100 mg by mouth once a week.     Evolocumab (REPATHA) 140 MG/ML SOSY Inject 140 mg into the skin every 14 (fourteen) days. 2.1 mL 12   fenofibrate 54 MG tablet Take 54 mg by mouth daily.     glucose blood test strip Use as instructed to check blood sugar 2 times daily 200 each 3   insulin degludec (TRESIBA FLEXTOUCH) 200 UNIT/ML FlexTouch Pen Inject 8 Units into the skin daily. 3 mL 0   Insulin Pen Needle (PEN NEEDLES) 32G X 4 MM MISC Use to inject into the skin daily 100 each 3   Insulin Syringe-Needle U-100 (INSULIN SYRINGE .3CC/31GX5/16") 31G X 5/16" 0.3 ML MISC Use to inject insulin twice daily 200 each 3   levothyroxine (SYNTHROID) 112 MCG tablet Take 1 tablet (112 mcg  total) by mouth daily. 90 tablet 0   losartan (COZAAR) 25 MG tablet Take 1 tablet (25 mg total) by mouth daily. 30 tablet 0   Multiple Vitamins-Minerals (ICAPS AREDS 2) CAPS Take 1 capsule by mouth 2 (two) times daily.     nitroGLYCERIN (NITROSTAT) 0.4 MG SL tablet Place 1 tablet (0.4 mg total) under the tongue every 5 (five) minutes as needed for chest pain. 25 tablet 3   OZEMPIC, 1 MG/DOSE, 4 MG/3ML SOPN INJECT 1 MG UNDER THE SKIN ONCE A WEEK 9 mL 1   pantoprazole (PROTONIX) 40 MG tablet Take 1 tablet (40 mg total) by mouth daily. 90 tablet 3   polyethylene glycol (MIRALAX / GLYCOLAX) 17 g packet Take 17 g by mouth as directed.     potassium chloride (KLOR-CON) 10 MEQ tablet Take 1 tablet (10 mEq total) by mouth daily. Take 1 tablet (31mEq) once per day when taking Torsemide. 30 tablet 5   Semaglutide, 2 MG/DOSE, (OZEMPIC, 2 MG/DOSE,) 8 MG/3ML SOPN Inject 2 mg into the skin once a week. 9 mL 3   torsemide (DEMADEX) 10 MG tablet Take 1 tablet (10 mg total) by mouth daily. Take 1 tablet (10mg ) daily. 30 tablet 3   vitamin B-12 (CYANOCOBALAMIN) 500 MCG tablet Take 500 mcg by mouth daily.     No current facility-administered medications on file prior to visit.    Review of Systems    She reports improvement in breathing, resolution of previous abdominal distention, and that she is urinating more freely or with a  more steady stream since changing to torsemide and increasing to daily frequency of dosing.  She reports ongoing episodes of chest pain, though alleviated with a second nitro and lasting 10 to 15 minutes in the setting, versus hours.  She reports weight gain when compared with months ago though stable from previous clinic visit.  She reports relief of previous headache. She denies presyncope or syncope.  Lower extremity edema is alleviated with torsemide. No recent falls of syncopal events. No s/sx of bleeding.   All other systems reviewed and are otherwise negative except as noted  above.  Physical Exam    VS:  BP (!) 168/84 (BP Location: Left Arm, Patient Position: Sitting, Cuff Size: Normal)   Pulse (!) 54   Ht 5\' 5"  (1.651 m)   Wt 179 lb (81.2 kg)   SpO2 93%   BMI 29.79 kg/m  , BMI Body mass index is 29.79 kg/m. GEN: Well nourished, well developed, in no acute distress. HEENT: normal. Neck: Supple, no JVD, carotid bruits, or masses. Cardiac: bradycardic but regular, 1/6 systolic murmur. No rubs, or gallops. No clubbing, cyanosis, nonpitting bilateral edema.  Radials/DP/PT 2+ and equal bilaterally.   Respiratory:  Respirations regular and unlabored, clear to auscultation bilaterally. GI: Soft, nontender, nondistended, BS + x 4. MS: no deformity or atrophy. Skin: warm and dry, no rash. Neuro:  Strength and sensation are intact. Psych: Normal affect.  Accessory Clinical Findings    ECG personally reviewed by me today - 54 bpm, AV dual paced with QTc  546ms- no acute changes.  VITALS Reviewed today   Temp Readings from Last 3 Encounters:  06/22/20 97.7 F (36.5 C) (Oral)  03/20/19 (!) 97.5 F (36.4 C)  12/27/18 97.7 F (36.5 C) (Temporal)   BP Readings from Last 3 Encounters:  08/13/20 (!) 160/92  07/30/20 (!) 148/92  07/10/20 120/80   Pulse Readings from Last 3 Encounters:  08/13/20 (!) 57  07/30/20 77  07/10/20 (!) 55    Wt Readings from Last 3 Encounters:  08/13/20 180 lb (81.6 kg)  07/30/20 179 lb (81.2 kg)  07/10/20 182 lb (82.6 kg)     LABS  reviewed today   Lab Results  Component Value Date   WBC 7.0 06/19/2020   HGB 14.2 06/19/2020   HCT 41.8 06/19/2020   MCV 88.2 06/19/2020   PLT 213 06/19/2020   Lab Results  Component Value Date   CREATININE 1.33 (H) 08/20/2020   BUN 27 (H) 08/20/2020   NA 140 08/20/2020   K 3.9 08/20/2020   CL 108 08/20/2020   CO2 24 08/20/2020   Lab Results  Component Value Date   ALT 13 08/20/2020   AST 26 08/20/2020   ALKPHOS 129 (H) 08/20/2020   BILITOT 1.1 08/20/2020   Lab Results   Component Value Date   CHOL 219 (H) 08/20/2020   HDL 41 08/20/2020   LDLCALC 124 (H) 08/20/2020   LDLDIRECT 63 09/17/2019   TRIG 269 (H) 08/20/2020   CHOLHDL 5.3 08/20/2020    Lab Results  Component Value Date   HGBA1C 6.5 (A) 07/30/2020   Lab Results  Component Value Date   TSH 0.88 03/31/2020     STUDIES/PROCEDURES reviewed today   MPI 08/26/2020 Pharmacological myocardial perfusion imaging study with no significant  Ischemia Small region of fixed defect in the mid to distal anteroseptal and apical region consistent with prior MI. GI uptake artifact. Hypokinesis of the mid to distal anteroseptal wall,  EF estimated at 57%  No EKG changes concerning for ischemia at peak stress or in recovery. Low risk scan  Echo 08/04/20  1. Left ventricular ejection fraction, by estimation, is 50 to 55%. The  left ventricle has low normal function. Left ventricular endocardial  border not optimally defined to evaluate regional wall motion. There is  mild left ventricular hypertrophy. Left  ventricular diastolic parameters are consistent with Grade II diastolic  dysfunction (pseudonormalization). Elevated left atrial pressure.   2. Right ventricular systolic function is normal. The right ventricular  size is normal. Tricuspid regurgitation signal is inadequate for assessing  PA pressure.   3. Left atrial size was mildly dilated.   4. The mitral valve is degenerative. Mild to moderate mitral valve  regurgitation. No evidence of mitral stenosis.   5. The aortic valve is tricuspid. There is moderate calcification of the  aortic valve. There is moderate thickening of the aortic valve. Aortic  valve regurgitation is trivial.   6. Aortic dilatation noted. There is mild dilatation of the ascending  aorta, measuring 37 mm.   7. Mildly dilated pulmonary artery.   [facility] 05/03/2017 Relatively normal right heart cath pressures with only mildly elevated LVEDP/PCP. Prox LAD to Mid LAD lesion is  45% stenosed. Mid LAD lesion is 40% stenosed. Neither lesion was flow-limiting Lat 2nd Diag lesion is 70% stenosed. Vessel is way too small for PCI Previously placed Ost 1st Mrg stent (unknown type) is widely patent. Angiographically moderate disease in the mid and distal LAD with patent stent to the circumflex.  Otherwise no significant disease. Mildly elevated LVEDP and PCWP with essentially normal pulmonary pressures. Adequately diuresed Suspect that patient's decompensation was related to A. fib with RVR and likely diastolic heart failure. Plan: Patient will return to nursing unit for ongoing care with TR band removal. Gentle hydration post cath Defer further plans to [facility] / EP service.  Echo 04/2017 Procedure narrative: Transthoracic echocardiography. Technically    difficult study with reduced echo windows.  - Left ventricle: The cavity size was normal. Wall thickness was    increased in a pattern of mild LVH. Systolic function was normal.    The estimated ejection fraction was in the range of 50% to 55%.    Doppler parameters are consistent with abnormal left ventricular    relaxation (grade 1 diastolic dysfunction). The E/e&' ratio is    between 8-15, suggesting indeterminate LV filling pressure.  - Aortic valve: Trileaflet. Sclerosis without stenosis. There was    trivial regurgitation.  - Mitral valve: Mildly thickened leaflets . There was trivial    regurgitation.  - Left atrium: The atrium was normal in size.  - Inferior vena cava: The vessel was normal in size. The    respirophasic diameter changes were in the normal range (>= 50%),    consistent with normal central venous pressure.   Assessment & Plan    Acute on chronic diastolic heart failure, PHTN, mild to moderate mitral regurgitation -- She reports improvement in breathing no ongoing dyspnea and episodes of chest pain as above.  She has had complete resolution of abdominal distention since changing to  torsemide and increasing frequency to daily dosing.  Updated echo above with EF 50 to 55%, mild LVH, G2 DD, mild LAE, mild to moderate MR, moderate thickening of the aortic valve/calcification with trivial TR, mildly dilated pulmonary artery, ascending aorta 37 mm.  2019 R/LHC with mildly elevated LVEDP/PCWP.  Diuresis has been complicated by renal function.  Diastolic heart failure complicated by  mitral regurgitation, episodes of atrial fibrillation, pulmonary hypertension, and sleep apnea as outlined above.  MPI was performed and ruled low risk, though she continues to report dyspnea and chest pain.  Given continued symptoms, recommendation today is to proceed with right and left heart catheterization for further evaluation of chest pain and to assist with diuresis/better understanding of her hemodynamics and valvular disease.  Possible referral to advanced HF clinic if needed/indicated by [place]. Continue current torsemide.  Repeat BMET and CBC today given upcoming catheterization. Instructions regarding medications to be provided with [facility] instructions.  Chest pain concerning for UA, Coronary artery dz s/p PCI (2019) -- She reports improved but ongoing exertional dyspnea and chest discomfort with exertion and at rest.  2019 LHC with pLAD to mLAD 45%s, m LAD 40%s.  Lateral second diagonal 70%s, though vessel too small for PCI.  Previously placed ostial first marginal stent was widely patent.  Recent echo with low normal EF 50 to 55%, though results limited by images and unable to assess if regional wall motion.  MPI performed for ongoing symptoms - overall ruled low risk.  MPI reviewed in detail.  She does report some improvement in her chest pain with at least 2 sublingual nitro, though she continues to have episodes of chest pain concerning for angina and improved but ongoing dyspnea.  Given her ongoing symptoms, recommendation today was to schedule right and left heart catheterization for further ischemic  work-up and better understanding of her hemodynamics/valvular disease.  Continue medical management.  No ASA given chronic OAC. Not on a BB --continue PRN Cardizem.  She is tolerating the sublingual nitro for CP despite history of headaches with Imdur.  Recommend ongoing aggressive risk factor modification.  Continue Repatha, started after her most recent visit.  Ongoing dietary and lifestyle changes.  Atrial fibrillation s/p DCCV 06/22/20 -- No reported tachypalpitations.  She does report intermittent lightheadedness but denies any syncope.  She remains in sinus rhythm today s/p DCCV 06/22/20 with device report of Afib after cardioversion. Per Afib clinic notes, her afib burden at baseline is ~8%, and if more persistent episodes, dofetilide admission could be considered in the future.  As previously noted, A. fib with is likely exacerbating her volume status.  Continue current PRN Cardizem for rates over 100bpm. Continue Eliquis 5mg  BID for anticoagulation with CHA2DS2VASc score of at least  7. No s/sx of bleeding.  Essential Hypertension, goal BP less than 130/80 -- BP suboptimal at 168/84.  As previously noted, suspect volume status is contributing to elevated BP.  Diuresis has been complicated by renal function. In the past, ACE/ARB deferred due to history of orthostasis and poor renal function, despite comorbid DM2. After her last visit, Losartan was started with plan for repeat labs today and further recommendations regarding ARB/torsemide at that time if renal function stable.  Avoiding hydralazine, given reflex tachycardia with history of A. Fib. Avoiding clonidine, given risk of rebound hypertension.  Could consider amlodipine if Cr will not allow for increase of torsemide or ARB. Avoiding Imdur given report of HA in the past - could retrial but will defer for now. Continue to monitor BP at home. Salt and fluid restrictions reviewed.  Ongoing treatment of sleep apnea recommended.  History of  orthostatic hypotension -- She remains off of Mestinon with only occasional episodes of lightheadedness.  Hyperlipidemia, Hypertriglyceridemia Statin intolerance --Most recent LDL not at goal and elevated Tg noted.  She has started Repatha since her previous clinic visit.  Recommend repeat lipid  and liver function in 6 to 8 weeks.  We will continue fenofibrate for now and pending repeat labs.  SSS s/p PPM --S/p PPM. Continue to follow with EP as directed.  OSA --Continue CPAP.  DM2 --Defer to endocrinology.  Consider addition of SGLT2 inhibitor at future clinic visits.  CKD --Will check BMET today.  Long history of renal insufficiency limiting diuresis as outlined above.  Caution with nephrotoxins.  Mild aortic dilation -- Recommend ongoing control of heart rate, BP, and cholesterol.  BP suboptimal with plan to control as above.  Started on [place].  Continue Eliquis in lieu of ASA.  Avoid fluoroquinolones and heavy lifting.  Recommend annual echo to monitor.  Dilated pulmonary artery/COPD -- Pulmonary referral discussed and provided in the past.  As above, we will schedule for right heart catheterization today for better understanding patient hemodynamics.   ---------------------------------  Shared Decision Making/Informed Consent The risks [stroke (1 in 1000), death (1 in 1000), kidney failure [usually temporary] (1 in 500), bleeding (1 in 200), allergic reaction [possibly serious] (1 in 200)], benefits (diagnostic support and management of coronary artery disease) and alternatives of a cardiac catheterization were discussed in detail with Ms. Mcfate and she is willing to proceed.   --------------------------  Medication changes:  --Pending labs, increase ARB / torsemide for BP support --Versus addition of amlodipine if renal function sub-optimal (cannot tolerate Imdur) Labs ordered:  --BMET, CBC pre catheterization   -Studies / Imaging ordered:   --R/LHC Future  considerations:  --[place] [facility] if indicated by [facility] results  --BP control optimization --Repeat lipid and liver function in 6-8w / continue fenofibrate? -Disposition:  --RTC after catheterization  *Please be aware that the above documentation was completed voice recognition software and may contain dictation errors.      Arvil Chaco, PA-C 08/31/2020

## 2020-08-31 NOTE — Progress Notes (Signed)
Office Visit    Patient Name: Dawn Garcia [facility] Date of Encounter: 08/31/2020  PCP:  Perrin Maltese, MD   [place]  Cardiologist: Dr. Saunders Revel Advanced Practice Provider:  No care team member to display Electrophysiologist: Dr. Caryl Comes  Chief Complaint    Chief Complaint  Patient presents with   Follow-up    Follow up to review test results. Medications verbally reviewed with patient.      80 y.o. female with history of CAD with remote stent and patent stent by catheterization in 2014, paroxysmal atrial fibrillation s/p reported ablation in 2013 and repeat atrial fibrillation ablation 1/66/0630, chronic diastolic CHF, COPD, CKD, DM2, orthostatic hypotension, hyperlipidemia, hypothyroidism, reported history of bradycardia / 5-second pauses on cardiac monitoring with dual-chamber 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 for sick sinus syndrome/tachybradycardia syndrome and RA lead revision due to dislodgment, OSA, and here today for follow-up of recent stress test.  Past Medical History    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[place])    Arthritis    "all over; particularly in hands/fingers; all my joints" (10/05/2017)   Atrial fibrillation (HCC)    CAD (coronary artery disease)    CHF (congestive heart failure) ([place])     A-Fib   Chronic kidney disease    COPD (chronic obstructive pulmonary disease) ([place])    "very mild" (10/05/2017)   Family history of adverse reaction to anesthesia    "daughter w/PONV & woke up during endoscopy" (10/05/2017)   GERD (gastroesophageal reflux disease)    Gout    Graves' disease    S/P "radioactive tx"   Heart disease    History of blood transfusion    "related to shoulder replacement"    History of gout    "no longer on daily RX" (10/05/2017)   History of hiatal hernia    Hyperlipidemia    Hypertension    Hypocalcemia    Hypokalemia     Hypothyroidism    Macular degeneration    Melanoma ([place]) ~ 2012   "off my back" (10/05/2017)   Myocardial infarction [facility])    "was told I'd had one; don't really know when" (10/05/2017)   Orthostatic hypotension 2020   OSA on CPAP    Pacemaker    Presence of permanent cardiac 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   Renal disorder    Scarring of lung 04/2017   "found by radiology" (10/05/2017)   Sinus node dysfunction (HCC)    Type II diabetes mellitus ([place])    Vitamin B12 deficiency    Vitamin D deficiency    Past Surgical History:  Procedure Laterality Date   APPENDECTOMY     ATRIAL FIBRILLATION ABLATION N/A 10/05/2017   Procedure: ATRIAL FIBRILLATION ABLATION;  Surgeon: Constance Haw, MD;  Location: [place] [facility];  Service: Cardiovascular;  Laterality: N/A;   ATRIAL FIBRILLATION ABLATION  ~ 2014   CARDIAC CATHETERIZATION  04/2017   CARDIOVERSION N/A 06/22/2020   Procedure: CARDIOVERSION;  Surgeon: Nelva Bush, MD;  Location: [facility] ORS;  Service: Cardiovascular;  Laterality: N/A;   COLONOSCOPY W/ BIOPSIES AND POLYPECTOMY  2015   1 polyp removed- repeat 3 years   CORONARY ANGIOPLASTY WITH STENT PLACEMENT  ~ 2014   EP IMPLANTABLE DEVICE N/A 11/10/2015   Procedure: Pacemaker Implant;  Surgeon: Will [name], MD;  Location: [place] [facility];  Service: Cardiovascular;  Laterality: N/A;   EP IMPLANTABLE DEVICE N/A  11/11/2015   Procedure: Lead Revision/Repair;  Surgeon: Evans Lance, MD;  Location: [place] [facility];  Service: Cardiovascular;  Laterality: N/A;   INGUINAL HERNIA REPAIR Left 1951   JOINT REPLACEMENT     LAPAROSCOPIC CHOLECYSTECTOMY     MELANOMA EXCISION  ~ 2012   "off my back" (10/05/2017)   PARATHYROIDECTOMY     "removed 3 glands; still have 1 gland" (10/05/2017)   RIGHT/LEFT HEART CATH AND CORONARY ANGIOGRAPHY N/A 05/03/2017   Procedure: RIGHT/LEFT HEART CATH AND CORONARY ANGIOGRAPHY;  Surgeon: Leonie Man, MD;  Location: [place] [facility];  Service:  Cardiovascular;  Laterality: N/A;   TONSILLECTOMY     TOTAL SHOULDER ARTHROPLASTY Right x 2   VAGINAL HYSTERECTOMY     "total"    Allergies  Allergies  Allergen Reactions   Demerol [Meperidine] Anaphylaxis    Tolerated Fentanyl 11/10/15   Epinephrine Shortness Of Breath and Palpitations    Powder   Multaq [Dronedarone] Diarrhea    Kidney failure and lung problems   Sulfa Antibiotics Anaphylaxis   Tetracyclines & Related Swelling and Other (See Comments)    Made nose, lips,  And tongue itchy   Quinolones Other (See Comments)    Aortic dilation. Avoid FQ, as these are showing to increase Ao dilation.     History of Present Illness    Dawn Garcia is a 80 y.o. female with PMH as above.  She is followed by the atrial fibrillation clinic in [place], as well as Dr. Caryl Comes.  She has a long history of atrial fibrillation with ablation in [place] in 2013 and repeat ablation by Dr. Curt Bears in 2019.  She was on amiodarone in the past but reported this was discontinued due to development of lung issues.  She did not tolerate Multaq.    She underwent DCCV 06/22/2020.   The device clinic received an alert for increased atrial fibrillation burden 06/26/2020.   When seen 06/30/2020, she was back in sinus rhythm.  Lasix was prescribed for wt gain and abdominal bloating.  She has been maintained on anticoagulation with Eliquis 5 mg twice daily and denies any bleeding issues on anticoagulation.  Seen 07/10/2020 with report of intermittent dieting for DM2 and regular follow-up with endocrinology.  She felt volume up and reported only receipt of a few tablets of Lasix at a previous clinic visit.  She reports taking 20 mg of Lasix daily then 40 mg of Lasix.  She was bloated and felt as if she is holding onto fluid in her stomach.  She reported abdominal aches that were not necessarily connected with food.  She wondered if her Mestinon was contributing.  She reported weight gain and at times reduced  appetite though with consideration of intermittent fasting.  Home weight 1 7171 pounds and most recently 176.7 pounds.  She was using her CPAP.  She reported home SBP 120-140 and DBP 50 to 80s.  Lasix was changed to torsemide.  Between visits, she reported ongoing weight gain and feelings of being volume up.  Several attempts to increase volume output were made with patient report that she was still holding onto volume and not having very much volume output.  As a result, echo obtained with results as below showing EF 50 to 55%, mild LVH, G2 DD, elevated left atrial pressure, mild LAE, mild to moderate MR with degenerative mitral valve, moderate calcification of the aortic valve with trivial AR, mild dilation of the aorta at 37 mm, mildly dilated pulmonary  artery.  Seen 08/13/2020 and reported wt gain, bloating, and cramping and uncertain if mestinon was contributing.  Had has GI symptoms with her abdominal bloating.  She noted chest pain, shortness of breath, dyspnea.  She had headache with elevated blood pressure.  She had throat pain with her chest pain.  She also had diaphoresis.  She reported bilateral lower extremity edema, alleviated with diaphoresis. Home BP 177/98 and repeat BP 183/103.   She reported weight as high as 177 pounds.  In clinic, BP 160/92 with HR 57 bpm and oxygen saturation 98%.  Weight 180 pounds.  She wonders what will happen if she goes down on her Mestinon, as she wonders if this is contributing to her elevated blood pressure.  She had recently learned about a family history of heart disease in her father and is trying to find out more information regarding this history.  He died at the age of 35.  She was uncertain if this was due to AVM versus thrombus.  She reported some personal stressors, including no surrounding her husband who has Alzheimer's. She r was not taking her torsemide at time though did note smaller ankles with diuresis.  She was taking as needed MiraLAX for  constipation. Recommendation was for MPI.  Subsequent recommendation was also for Repatha and referral to sleep apnea clinic.  MPI showed no significant ischemia.  A small region of fixed defect in the mid to distal anterior septal and apical region was consistent with prior MI.  GI uptake was noted.  Hypokinesis in the mid to distal anterior septal wall was noted.  EF estimated at 57%.  Overall, this was ruled a low risk scan.  Today, 08/31/2020, she returns to clinic and notes that she overall feels improved from her previous visits.  Her abdominal bloating and headache has improved since switching to torsemide and increasing frequency to daily dosing of torsemide.  She continues to have episodes of chest pain that feels like an ache and is located in the center of the chest.  It is usually on the right side/right breast.  Chest pain can last up to an hour or more.  She has taken sublingual nitro since her last visit and if experiencing chest pain with report this nitro does help by the second dose.  With nitro, chest pain is alleviated within 10 to 15 minutes.  Since her previous visits,  her headache and breathing has improved.  Since starting torsemide, she has noted resolution of her abdominal bloating and that she is breathing easier.  She feels as if she is urinating more freely, or rather with a more steady stream and more urine output each time.  She has some periods of lightheadedness/presyncope.  No tachypalpitations.  BP at home 175/87, 177/92, 151/85, 191/117.  Rates well controlled at 72 bpm.  She reports home weight 176 to 177 pounds.  She is hopeful to find out more regarding her family history by July 4 of this year.  Is no longer taking her mestinon.  Stress test reviewed, as well as ongoing chest pain with recommendations as below.  No signs or symptoms of bleeding.  She reports medication compliance and does now have her Repatha.,  Patient request today to make Dr. Caryl Comes her primary  cardiologist.  Discussed with EP nurse with request that patient also choose a general cardiologist.  Patient requests to change her general cardiologist/primary cardiologist to Dr. Harrell Gave End today.  Home Medications    Current Outpatient Medications on  File Prior to Visit  Medication Sig Dispense Refill   acetaminophen (TYLENOL) 650 MG CR tablet Take 650 mg by mouth at bedtime.     apixaban (ELIQUIS) 5 MG TABS tablet Take 1 tablet (5 mg total) by mouth 2 (two) times daily. 180 tablet 1   Ascorbic Acid (VITAMIN C PO) Take 500 mg by mouth daily.     carboxymethylcellulose (REFRESH PLUS) 0.5 % SOLN Place 1 drop into both eyes daily as needed (dry eyes).     Cholecalciferol (VITAMIN D3) 5000 units TABS Take 5,000 Units by mouth every morning.     Continuous Blood Gluc Receiver (DEXCOM G6 RECEIVER) DEVI Use as instructed to check blood sugar daily. 1 each 1   Continuous Blood Gluc Sensor (DEXCOM G6 SENSOR) MISC Use as instructed to check blood sugar daily 9 each 3   Continuous Blood Gluc Transmit (DEXCOM G6 TRANSMITTER) MISC Use as instructed to check blood sugar. 1 each 3   diltiazem (CARDIZEM) 30 MG tablet Take 1 tablet every 4 hours AS NEEDED for heart rate >100 30 tablet 1   docusate sodium (COLACE) 100 MG capsule Take 100 mg by mouth once a week.     Evolocumab (REPATHA) 140 MG/ML SOSY Inject 140 mg into the skin every 14 (fourteen) days. 2.1 mL 12   fenofibrate 54 MG tablet Take 54 mg by mouth daily.     glucose blood test strip Use as instructed to check blood sugar 2 times daily 200 each 3   insulin degludec (TRESIBA FLEXTOUCH) 200 UNIT/ML FlexTouch Pen Inject 8 Units into the skin daily. 3 mL 0   Insulin Pen Needle (PEN NEEDLES) 32G X 4 MM MISC Use to inject into the skin daily 100 each 3   Insulin Syringe-Needle U-100 (INSULIN SYRINGE .3CC/31GX5/16") 31G X 5/16" 0.3 ML MISC Use to inject insulin twice daily 200 each 3   levothyroxine (SYNTHROID) 112 MCG tablet Take 1 tablet (112 mcg  total) by mouth daily. 90 tablet 0   losartan (COZAAR) 25 MG tablet Take 1 tablet (25 mg total) by mouth daily. 30 tablet 0   Multiple Vitamins-Minerals (ICAPS AREDS 2) CAPS Take 1 capsule by mouth 2 (two) times daily.     nitroGLYCERIN (NITROSTAT) 0.4 MG SL tablet Place 1 tablet (0.4 mg total) under the tongue every 5 (five) minutes as needed for chest pain. 25 tablet 3   OZEMPIC, 1 MG/DOSE, 4 MG/3ML SOPN INJECT 1 MG UNDER THE SKIN ONCE A WEEK 9 mL 1   pantoprazole (PROTONIX) 40 MG tablet Take 1 tablet (40 mg total) by mouth daily. 90 tablet 3   polyethylene glycol (MIRALAX / GLYCOLAX) 17 g packet Take 17 g by mouth as directed.     potassium chloride (KLOR-CON) 10 MEQ tablet Take 1 tablet (10 mEq total) by mouth daily. Take 1 tablet (23mEq) once per day when taking Torsemide. 30 tablet 5   Semaglutide, 2 MG/DOSE, (OZEMPIC, 2 MG/DOSE,) 8 MG/3ML SOPN Inject 2 mg into the skin once a week. 9 mL 3   torsemide (DEMADEX) 10 MG tablet Take 1 tablet (10 mg total) by mouth daily. Take 1 tablet (10mg ) daily. 30 tablet 3   vitamin B-12 (CYANOCOBALAMIN) 500 MCG tablet Take 500 mcg by mouth daily.     No current facility-administered medications on file prior to visit.    Review of Systems    She reports improvement in breathing, resolution of previous abdominal distention, and that she is urinating more freely or with a  more steady stream since changing to torsemide and increasing to daily frequency of dosing.  She reports ongoing episodes of chest pain, though alleviated with a second nitro and lasting 10 to 15 minutes in the setting, versus hours.  She reports weight gain when compared with months ago though stable from previous clinic visit.  She reports relief of previous headache. She denies presyncope or syncope.  Lower extremity edema is alleviated with torsemide. No recent falls of syncopal events. No s/sx of bleeding.   All other systems reviewed and are otherwise negative except as noted  above.  Physical Exam    VS:  BP (!) 168/84 (BP Location: Left Arm, Patient Position: Sitting, Cuff Size: Normal)   Pulse (!) 54   Ht 5\' 5"  (1.651 m)   Wt 179 lb (81.2 kg)   SpO2 93%   BMI 29.79 kg/m  , BMI Body mass index is 29.79 kg/m. GEN: Well nourished, well developed, in no acute distress. HEENT: normal. Neck: Supple, no JVD, carotid bruits, or masses. Cardiac: bradycardic but regular, 1/6 systolic murmur. No rubs, or gallops. No clubbing, cyanosis, nonpitting bilateral edema.  Radials/DP/PT 2+ and equal bilaterally.   Respiratory:  Respirations regular and unlabored, clear to auscultation bilaterally. GI: Soft, nontender, nondistended, BS + x 4. MS: no deformity or atrophy. Skin: warm and dry, no rash. Neuro:  Strength and sensation are intact. Psych: Normal affect.  Accessory Clinical Findings    ECG personally reviewed by me today - 54 bpm, AV dual paced with QTc  561ms- no acute changes.  VITALS Reviewed today   Temp Readings from Last 3 Encounters:  06/22/20 97.7 F (36.5 C) (Oral)  03/20/19 (!) 97.5 F (36.4 C)  12/27/18 97.7 F (36.5 C) (Temporal)   BP Readings from Last 3 Encounters:  08/13/20 (!) 160/92  07/30/20 (!) 148/92  07/10/20 120/80   Pulse Readings from Last 3 Encounters:  08/13/20 (!) 57  07/30/20 77  07/10/20 (!) 55    Wt Readings from Last 3 Encounters:  08/13/20 180 lb (81.6 kg)  07/30/20 179 lb (81.2 kg)  07/10/20 182 lb (82.6 kg)     LABS  reviewed today   Lab Results  Component Value Date   WBC 7.0 06/19/2020   HGB 14.2 06/19/2020   HCT 41.8 06/19/2020   MCV 88.2 06/19/2020   PLT 213 06/19/2020   Lab Results  Component Value Date   CREATININE 1.33 (H) 08/20/2020   BUN 27 (H) 08/20/2020   NA 140 08/20/2020   K 3.9 08/20/2020   CL 108 08/20/2020   CO2 24 08/20/2020   Lab Results  Component Value Date   ALT 13 08/20/2020   AST 26 08/20/2020   ALKPHOS 129 (H) 08/20/2020   BILITOT 1.1 08/20/2020   Lab Results   Component Value Date   CHOL 219 (H) 08/20/2020   HDL 41 08/20/2020   LDLCALC 124 (H) 08/20/2020   LDLDIRECT 63 09/17/2019   TRIG 269 (H) 08/20/2020   CHOLHDL 5.3 08/20/2020    Lab Results  Component Value Date   HGBA1C 6.5 (A) 07/30/2020   Lab Results  Component Value Date   TSH 0.88 03/31/2020     STUDIES/PROCEDURES reviewed today   MPI 08/26/2020 Pharmacological myocardial perfusion imaging study with no significant  Ischemia Small region of fixed defect in the mid to distal anteroseptal and apical region consistent with prior MI. GI uptake artifact. Hypokinesis of the mid to distal anteroseptal wall,  EF estimated at 57%  No EKG changes concerning for ischemia at peak stress or in recovery. Low risk scan  Echo 08/04/20  1. Left ventricular ejection fraction, by estimation, is 50 to 55%. The  left ventricle has low normal function. Left ventricular endocardial  border not optimally defined to evaluate regional wall motion. There is  mild left ventricular hypertrophy. Left  ventricular diastolic parameters are consistent with Grade II diastolic  dysfunction (pseudonormalization). Elevated left atrial pressure.   2. Right ventricular systolic function is normal. The right ventricular  size is normal. Tricuspid regurgitation signal is inadequate for assessing  PA pressure.   3. Left atrial size was mildly dilated.   4. The mitral valve is degenerative. Mild to moderate mitral valve  regurgitation. No evidence of mitral stenosis.   5. The aortic valve is tricuspid. There is moderate calcification of the  aortic valve. There is moderate thickening of the aortic valve. Aortic  valve regurgitation is trivial.   6. Aortic dilatation noted. There is mild dilatation of the ascending  aorta, measuring 37 mm.   7. Mildly dilated pulmonary artery.   [facility] 05/03/2017 Relatively normal right heart cath pressures with only mildly elevated LVEDP/PCP. Prox LAD to Mid LAD lesion is  45% stenosed. Mid LAD lesion is 40% stenosed. Neither lesion was flow-limiting Lat 2nd Diag lesion is 70% stenosed. Vessel is way too small for PCI Previously placed Ost 1st Mrg stent (unknown type) is widely patent. Angiographically moderate disease in the mid and distal LAD with patent stent to the circumflex.  Otherwise no significant disease. Mildly elevated LVEDP and PCWP with essentially normal pulmonary pressures. Adequately diuresed Suspect that patient's decompensation was related to A. fib with RVR and likely diastolic heart failure. Plan: Patient will return to nursing unit for ongoing care with TR band removal. Gentle hydration post cath Defer further plans to [facility] / EP service.  Echo 04/2017 Procedure narrative: Transthoracic echocardiography. Technically    difficult study with reduced echo windows.  - Left ventricle: The cavity size was normal. Wall thickness was    increased in a pattern of mild LVH. Systolic function was normal.    The estimated ejection fraction was in the range of 50% to 55%.    Doppler parameters are consistent with abnormal left ventricular    relaxation (grade 1 diastolic dysfunction). The E/e&' ratio is    between 8-15, suggesting indeterminate LV filling pressure.  - Aortic valve: Trileaflet. Sclerosis without stenosis. There was    trivial regurgitation.  - Mitral valve: Mildly thickened leaflets . There was trivial    regurgitation.  - Left atrium: The atrium was normal in size.  - Inferior vena cava: The vessel was normal in size. The    respirophasic diameter changes were in the normal range (>= 50%),    consistent with normal central venous pressure.   Assessment & Plan    Acute on chronic diastolic heart failure, PHTN, mild to moderate mitral regurgitation -- She reports improvement in breathing no ongoing dyspnea and episodes of chest pain as above.  She has had complete resolution of abdominal distention since changing to  torsemide and increasing frequency to daily dosing.  Updated echo above with EF 50 to 55%, mild LVH, G2 DD, mild LAE, mild to moderate MR, moderate thickening of the aortic valve/calcification with trivial TR, mildly dilated pulmonary artery, ascending aorta 37 mm.  2019 R/LHC with mildly elevated LVEDP/PCWP.  Diuresis has been complicated by renal function.  Diastolic heart failure complicated by  mitral regurgitation, episodes of atrial fibrillation, pulmonary hypertension, and sleep apnea as outlined above.  MPI was performed and ruled low risk, though she continues to report dyspnea and chest pain.  Given continued symptoms, recommendation today is to proceed with right and left heart catheterization for further evaluation of chest pain and to assist with diuresis/better understanding of her hemodynamics and valvular disease.  Possible referral to advanced HF clinic if needed/indicated by [place]. Continue current torsemide.  Repeat BMET and CBC today given upcoming catheterization. Instructions regarding medications to be provided with [facility] instructions.  Chest pain concerning for UA, Coronary artery dz s/p PCI (2019) -- She reports improved but ongoing exertional dyspnea and chest discomfort with exertion and at rest.  2019 LHC with pLAD to mLAD 45%s, m LAD 40%s.  Lateral second diagonal 70%s, though vessel too small for PCI.  Previously placed ostial first marginal stent was widely patent.  Recent echo with low normal EF 50 to 55%, though results limited by images and unable to assess if regional wall motion.  MPI performed for ongoing symptoms - overall ruled low risk.  MPI reviewed in detail.  She does report some improvement in her chest pain with at least 2 sublingual nitro, though she continues to have episodes of chest pain concerning for angina and improved but ongoing dyspnea.  Given her ongoing symptoms, recommendation today was to schedule right and left heart catheterization for further ischemic  work-up and better understanding of her hemodynamics/valvular disease.  Continue medical management.  No ASA given chronic OAC. Not on a BB --continue PRN Cardizem.  She is tolerating the sublingual nitro for CP despite history of headaches with Imdur.  Recommend ongoing aggressive risk factor modification.  Continue Repatha, started after her most recent visit.  Ongoing dietary and lifestyle changes.  Atrial fibrillation s/p DCCV 06/22/20 -- No reported tachypalpitations.  She does report intermittent lightheadedness but denies any syncope.  She remains in sinus rhythm today s/p DCCV 06/22/20 with device report of Afib after cardioversion. Per Afib clinic notes, her afib burden at baseline is ~8%, and if more persistent episodes, dofetilide admission could be considered in the future.  As previously noted, A. fib with is likely exacerbating her volume status.  Continue current PRN Cardizem for rates over 100bpm. Continue Eliquis 5mg  BID for anticoagulation with CHA2DS2VASc score of at least  7. No s/sx of bleeding.  Essential Hypertension, goal BP less than 130/80 -- BP suboptimal at 168/84.  As previously noted, suspect volume status is contributing to elevated BP.  Diuresis has been complicated by renal function. In the past, ACE/ARB deferred due to history of orthostasis and poor renal function, despite comorbid DM2. After her last visit, Losartan was started with plan for repeat labs today and further recommendations regarding ARB/torsemide at that time if renal function stable.  Avoiding hydralazine, given reflex tachycardia with history of A. Fib. Avoiding clonidine, given risk of rebound hypertension.  Could consider amlodipine if Cr will not allow for increase of torsemide or ARB. Avoiding Imdur given report of HA in the past - could retrial but will defer for now. Continue to monitor BP at home. Salt and fluid restrictions reviewed.  Ongoing treatment of sleep apnea recommended.  History of  orthostatic hypotension -- She remains off of Mestinon with only occasional episodes of lightheadedness.  Hyperlipidemia, Hypertriglyceridemia Statin intolerance --Most recent LDL not at goal and elevated Tg noted.  She has started Repatha since her previous clinic visit.  Recommend repeat lipid  and liver function in 6 to 8 weeks.  We will continue fenofibrate for now and pending repeat labs.  SSS s/p PPM --S/p PPM. Continue to follow with EP as directed.  OSA --Continue CPAP.  DM2 --Defer to endocrinology.  Consider addition of SGLT2 inhibitor at future clinic visits.  CKD --Will check BMET today.  Long history of renal insufficiency limiting diuresis as outlined above.  Caution with nephrotoxins.  Mild aortic dilation -- Recommend ongoing control of heart rate, BP, and cholesterol.  BP suboptimal with plan to control as above.  Started on Repatha.  Continue Eliquis in lieu of ASA.  Avoid fluoroquinolones and heavy lifting.  Recommend annual echo to monitor.  Dilated pulmonary artery/COPD -- Pulmonary referral discussed and provided in the past.  As above, we will schedule for right heart catheterization today for better understanding patient hemodynamics.   ---------------------------------  Shared Decision Making/Informed Consent The risks [stroke (1 in 1000), death (1 in 1000), kidney failure [usually temporary] (1 in 500), bleeding (1 in 200), allergic reaction [possibly serious] (1 in 200)], benefits (diagnostic support and management of coronary artery disease) and alternatives of a cardiac catheterization were discussed in detail with Ms. Crisanti and she is willing to proceed.   --------------------------  Medication changes:  --Pending labs, increase ARB / torsemide for BP support --Versus addition of amlodipine if renal function sub-optimal (cannot tolerate Imdur) Labs ordered:  --BMET, CBC pre catheterization   -Studies / Imaging ordered:   --R/LHC Future  considerations:  --[place] [facility] clinic if indicated by [facility] results  --BP control optimization --Repeat lipid and liver function in 6-8w / continue fenofibrate? -Disposition:  --RTC after catheterization  *Please be aware that the above documentation was completed voice recognition software and may contain dictation errors.      Arvil Chaco, PA-C 08/31/2020

## 2020-09-01 LAB — CBC
Hematocrit: 40.7 % (ref 34.0–46.6)
Hemoglobin: 13.6 g/dL (ref 11.1–15.9)
MCH: 29.1 pg (ref 26.6–33.0)
MCHC: 33.4 g/dL (ref 31.5–35.7)
MCV: 87 fL (ref 79–97)
Platelets: 211 10*3/uL (ref 150–450)
RBC: 4.67 x10E6/uL (ref 3.77–5.28)
RDW: 13.4 % (ref 11.7–15.4)
WBC: 5.5 10*3/uL (ref 3.4–10.8)

## 2020-09-01 LAB — BASIC METABOLIC PANEL
BUN/Creatinine Ratio: 14 (ref 12–28)
BUN: 17 mg/dL (ref 8–27)
CO2: 22 mmol/L (ref 20–29)
Calcium: 9.3 mg/dL (ref 8.7–10.3)
Chloride: 105 mmol/L (ref 96–106)
Creatinine, Ser: 1.23 mg/dL — ABNORMAL HIGH (ref 0.57–1.00)
Glucose: 95 mg/dL (ref 65–99)
Potassium: 4.3 mmol/L (ref 3.5–5.2)
Sodium: 141 mmol/L (ref 134–144)
eGFR: 45 mL/min/{1.73_m2} — ABNORMAL LOW (ref >59)

## 2020-09-02 MED ORDER — SODIUM CHLORIDE 0.9% FLUSH: 3.0000 mL | Freq: Two times a day (BID) | INTRAVENOUS | Status: DC

## 2020-09-08 ENCOUNTER — Other Ambulatory Visit: Payer: Self-pay | Admitting: *Deleted

## 2020-09-08 DIAGNOSIS — E1159 Type 2 diabetes mellitus with other circulatory complications: Secondary | ICD-10-CM

## 2020-09-08 DIAGNOSIS — I10 Essential (primary) hypertension: Secondary | ICD-10-CM

## 2020-09-08 MED ORDER — LOSARTAN POTASSIUM 25 MG PO TABS: 25.0000 mg | ORAL_TABLET | Freq: Every day | ORAL | 0 refills | Status: DC

## 2020-09-08 NOTE — Telephone Encounter (Signed)
Received fax request from pharmacy for refill on Losartan. Sent in #30 with 0 refills.

## 2020-09-11 ENCOUNTER — Other Ambulatory Visit: Payer: Self-pay

## 2020-09-11 DIAGNOSIS — E1159 Type 2 diabetes mellitus with other circulatory complications: Secondary | ICD-10-CM

## 2020-09-11 DIAGNOSIS — E1165 Type 2 diabetes mellitus with hyperglycemia: Secondary | ICD-10-CM

## 2020-09-11 DIAGNOSIS — I10 Essential (primary) hypertension: Secondary | ICD-10-CM

## 2020-09-11 MED ORDER — LOSARTAN POTASSIUM 25 MG PO TABS: 50.0000 mg | ORAL_TABLET | Freq: Every day | ORAL | 0 refills | Status: DC

## 2020-09-15 ENCOUNTER — Other Ambulatory Visit: Payer: Self-pay | Admitting: *Deleted

## 2020-09-15 MED ORDER — LOSARTAN POTASSIUM 50 MG PO TABS: 50.0000 mg | ORAL_TABLET | Freq: Every day | ORAL | 1 refills | Status: DC

## 2020-09-15 NOTE — Telephone Encounter (Signed)
Per 09/02/20 note, patient messaged Dawn Garcia RE:  her losartan dosage at which time Dawn Garcia told her to take 2 tablets of losartan 25mg  at the same time for a total of 50mg  daily. Am I to change it to 50mg  tablets? Thank you!

## 2020-09-15 NOTE — Progress Notes (Signed)
Losartan 

## 2020-09-17 ENCOUNTER — Telehealth: Payer: Self-pay | Admitting: Internal Medicine

## 2020-09-17 DIAGNOSIS — E890 Postprocedural hypothyroidism: Secondary | ICD-10-CM

## 2020-09-17 MED ORDER — LEVOTHYROXINE SODIUM 112 MCG PO TABS: 112.0000 ug | ORAL_TABLET | Freq: Every day | ORAL | 0 refills | Status: DC

## 2020-09-17 NOTE — Telephone Encounter (Signed)
Rx sent to preferred pharmacy.

## 2020-09-17 NOTE — Telephone Encounter (Signed)
MEDICATION:  levothyroxine (SYNTHROID) 112 MCG tablet  PHARMACY:   [id number] - [place] [address], [place] [address] AT [place] [place] Phone:  [phone]  Fax:  [phone]      HAS THE PATIENT CONTACTED THEIR PHARMACY?  Yes-PHARM told Patient they have sent a request for refill with no response  IS THIS A 90 DAY SUPPLY : Yes  IS PATIENT OUT OF MEDICATION: Yes  IF NOT; HOW MUCH IS LEFT: 0  LAST APPOINTMENT DATE: @6 /10/2020  NEXT APPOINTMENT DATE:@9 /16/2022  DO WE HAVE YOUR PERMISSION TO LEAVE A DETAILED MESSAGE?:Yes  OTHER COMMENTS: Patient requests new RX with refills   **Let patient know to contact pharmacy at the end of the day to make sure medication is ready. **  ** Please notify patient to allow 48-72 hours to process**  **Encourage patient to contact the pharmacy for refills or they can request refills through [facility]**

## 2020-09-17 NOTE — Addendum Note (Signed)
Addended by: Lauralyn Primes on: 09/17/2020 06:14 PM   Modules accepted: Orders

## 2020-09-18 ENCOUNTER — Encounter
Admission: RE | Disposition: A | Discharge status: Home / Self Care | Payer: Self-pay | Source: Home / Self Care | Attending: Internal Medicine

## 2020-09-18 ENCOUNTER — Ambulatory Visit
Admission: RE | Admit: 2020-09-18 | Discharge: 2020-09-18 | Disposition: A | Discharge status: Home / Self Care | Payer: Medicare Other | Attending: Internal Medicine | Admitting: Internal Medicine

## 2020-09-18 ENCOUNTER — Ambulatory Visit (INDEPENDENT_AMBULATORY_CARE_PROVIDER_SITE_OTHER): Payer: Medicare Other

## 2020-09-18 DIAGNOSIS — Z79899 Other long term (current) drug therapy: Secondary | ICD-10-CM | POA: Diagnosis not present

## 2020-09-18 DIAGNOSIS — I272 Pulmonary hypertension, unspecified: Secondary | ICD-10-CM

## 2020-09-18 DIAGNOSIS — I340 Nonrheumatic mitral (valve) insufficiency: Secondary | ICD-10-CM | POA: Diagnosis not present

## 2020-09-18 DIAGNOSIS — E781 Pure hyperglyceridemia: Secondary | ICD-10-CM | POA: Insufficient documentation

## 2020-09-18 DIAGNOSIS — I130 Hypertensive heart and chronic kidney disease with heart failure and stage 1 through stage 4 chronic kidney disease, or unspecified chronic kidney disease: Secondary | ICD-10-CM | POA: Insufficient documentation

## 2020-09-18 DIAGNOSIS — G4733 Obstructive sleep apnea (adult) (pediatric): Secondary | ICD-10-CM | POA: Diagnosis not present

## 2020-09-18 DIAGNOSIS — E785 Hyperlipidemia, unspecified: Secondary | ICD-10-CM | POA: Diagnosis not present

## 2020-09-18 DIAGNOSIS — I495 Sick sinus syndrome: Secondary | ICD-10-CM | POA: Diagnosis not present

## 2020-09-18 DIAGNOSIS — I5033 Acute on chronic diastolic (congestive) heart failure: Secondary | ICD-10-CM | POA: Diagnosis not present

## 2020-09-18 DIAGNOSIS — J449 Chronic obstructive pulmonary disease, unspecified: Secondary | ICD-10-CM | POA: Diagnosis not present

## 2020-09-18 DIAGNOSIS — Z794 Long term (current) use of insulin: Secondary | ICD-10-CM | POA: Insufficient documentation

## 2020-09-18 DIAGNOSIS — Z7901 Long term (current) use of anticoagulants: Secondary | ICD-10-CM | POA: Diagnosis not present

## 2020-09-18 DIAGNOSIS — Z950 Presence of cardiac pacemaker: Secondary | ICD-10-CM | POA: Insufficient documentation

## 2020-09-18 DIAGNOSIS — I25110 Atherosclerotic heart disease of native coronary artery with unstable angina pectoris: Secondary | ICD-10-CM | POA: Diagnosis not present

## 2020-09-18 DIAGNOSIS — N189 Chronic kidney disease, unspecified: Secondary | ICD-10-CM | POA: Insufficient documentation

## 2020-09-18 DIAGNOSIS — I4891 Unspecified atrial fibrillation: Secondary | ICD-10-CM | POA: Diagnosis not present

## 2020-09-18 DIAGNOSIS — E1122 Type 2 diabetes mellitus with diabetic chronic kidney disease: Secondary | ICD-10-CM | POA: Diagnosis not present

## 2020-09-18 DIAGNOSIS — R0600 Dyspnea, unspecified: Secondary | ICD-10-CM

## 2020-09-18 DIAGNOSIS — I10 Essential (primary) hypertension: Secondary | ICD-10-CM

## 2020-09-18 HISTORY — PX: RIGHT/LEFT HEART CATH AND CORONARY ANGIOGRAPHY: CATH118266

## 2020-09-18 SURGERY — RIGHT/LEFT HEART CATH AND CORONARY ANGIOGRAPHY
Anesthesia: Moderate Sedation

## 2020-09-18 MED ORDER — HYDRALAZINE HCL 20 MG/ML IJ SOLN
INTRAMUSCULAR | Status: AC
  Filled 2020-09-18: qty 1

## 2020-09-18 MED ORDER — ACETAMINOPHEN 325 MG PO TABS
ORAL_TABLET | ORAL | Status: AC
  Filled 2020-09-18: qty 2

## 2020-09-18 MED ORDER — HEPARIN (PORCINE) IN NACL 1000-0.9 UT/500ML-% IV SOLN
INTRAVENOUS | Status: AC
  Filled 2020-09-18: qty 1000

## 2020-09-18 MED ORDER — VERAPAMIL HCL 2.5 MG/ML IV SOLN
INTRAVENOUS | Status: DC | PRN
  Administered 2020-09-18: 2.5 mg via INTRA_ARTERIAL

## 2020-09-18 MED ORDER — ASPIRIN 81 MG PO CHEW
CHEWABLE_TABLET | ORAL | Status: AC
  Filled 2020-09-18: qty 1

## 2020-09-18 MED ORDER — IOHEXOL 300 MG/ML  SOLN
INTRAMUSCULAR | Status: DC | PRN
  Administered 2020-09-18: 30 mL

## 2020-09-18 MED ORDER — HEPARIN (PORCINE) IN NACL 2000-0.9 UNIT/L-% IV SOLN
INTRAVENOUS | Status: DC | PRN
  Administered 2020-09-18: 1000 mL

## 2020-09-18 MED ORDER — ASPIRIN 81 MG PO CHEW
81.0000 mg | CHEWABLE_TABLET | ORAL | Status: AC
  Administered 2020-09-18: 81 mg via ORAL

## 2020-09-18 MED ORDER — SODIUM CHLORIDE 0.9% FLUSH: 3.0000 mL | Freq: Two times a day (BID) | INTRAVENOUS | Status: DC

## 2020-09-18 MED ORDER — ACETAMINOPHEN 325 MG PO TABS
650.0000 mg | ORAL_TABLET | ORAL | Status: DC | PRN
  Administered 2020-09-18: 650 mg via ORAL

## 2020-09-18 MED ORDER — SODIUM CHLORIDE 0.9% FLUSH: 3.0000 mL | INTRAVENOUS | Status: DC | PRN

## 2020-09-18 MED ORDER — ONDANSETRON HCL 4 MG/2ML IJ SOLN: 4.0000 mg | Freq: Four times a day (QID) | INTRAMUSCULAR | Status: DC | PRN

## 2020-09-18 MED ORDER — MIDAZOLAM HCL 2 MG/2ML IJ SOLN
INTRAMUSCULAR | Status: DC | PRN
  Administered 2020-09-18: 1 mg via INTRAVENOUS

## 2020-09-18 MED ORDER — FENTANYL CITRATE (PF) 100 MCG/2ML IJ SOLN
INTRAMUSCULAR | Status: AC
  Filled 2020-09-18: qty 2

## 2020-09-18 MED ORDER — TORSEMIDE 10 MG PO TABS: 20.0000 mg | ORAL_TABLET | Freq: Every day | ORAL | 3 refills | Status: DC

## 2020-09-18 MED ORDER — HYDRALAZINE HCL 20 MG/ML IJ SOLN
10.0000 mg | INTRAMUSCULAR | Status: DC | PRN
Start: 2020-09-18 — End: 2020-09-18

## 2020-09-18 MED ORDER — HEPARIN SODIUM (PORCINE) 1000 UNIT/ML IJ SOLN
INTRAMUSCULAR | Status: DC | PRN
  Administered 2020-09-18: 4000 [IU] via INTRAVENOUS

## 2020-09-18 MED ORDER — SODIUM CHLORIDE 0.9 % IV SOLN: 250.0000 mL | INTRAVENOUS | Status: DC | PRN

## 2020-09-18 MED ORDER — VERAPAMIL HCL 2.5 MG/ML IV SOLN
INTRAVENOUS | Status: AC
  Filled 2020-09-18: qty 2

## 2020-09-18 MED ORDER — MIDAZOLAM HCL 2 MG/2ML IJ SOLN
INTRAMUSCULAR | Status: AC
  Filled 2020-09-18: qty 2

## 2020-09-18 MED ORDER — AMLODIPINE BESYLATE 5 MG PO TABS: 2.5000 mg | ORAL_TABLET | Freq: Every day | ORAL | 5 refills | Status: DC

## 2020-09-18 MED ORDER — LABETALOL HCL 5 MG/ML IV SOLN: 10.0000 mg | INTRAVENOUS | Status: DC | PRN

## 2020-09-18 MED ORDER — HEPARIN SODIUM (PORCINE) 1000 UNIT/ML IJ SOLN
INTRAMUSCULAR | Status: AC
  Filled 2020-09-18: qty 1

## 2020-09-18 MED ORDER — LIDOCAINE HCL (PF) 1 % IJ SOLN
INTRAMUSCULAR | Status: AC
  Filled 2020-09-18: qty 30

## 2020-09-18 MED ORDER — FENTANYL CITRATE (PF) 100 MCG/2ML IJ SOLN
INTRAMUSCULAR | Status: DC | PRN
  Administered 2020-09-18: 25 ug via INTRAVENOUS

## 2020-09-18 MED ORDER — SODIUM CHLORIDE 0.9 % IV SOLN
INTRAVENOUS | Status: DC
  Administered 2020-09-18: 1000 mL via INTRAVENOUS

## 2020-09-18 SURGICAL SUPPLY — 13 items
CATH BALLN WEDGE 5F 110CM (CATHETERS) ×2 IMPLANT
CATH INFINITI 5 FR JL3.5 (CATHETERS) ×2 IMPLANT
CATH INFINITI JR4 5F (CATHETERS) ×2 IMPLANT
DEVICE RAD TR BAND REGULAR (VASCULAR PRODUCTS) ×2 IMPLANT
DRAPE BRACHIAL (DRAPES) ×4 IMPLANT
GLIDESHEATH SLEND SS 6F .021 (SHEATH) ×2 IMPLANT
GUIDEWIRE .025 260CM (WIRE) ×2 IMPLANT
PACK CARDIAC CATH (CUSTOM PROCEDURE TRAY) ×2 IMPLANT
PROTECTION STATION PRESSURIZED (MISCELLANEOUS) ×2
SET ATX SIMPLICITY (MISCELLANEOUS) ×2 IMPLANT
SHEATH GLIDE SLENDER 4/5FR (SHEATH) ×4 IMPLANT
STATION PROTECTION PRESSURIZED (MISCELLANEOUS) ×1 IMPLANT
WIRE HI TORQ VERSACORE J 260CM (WIRE) ×2 IMPLANT

## 2020-09-18 NOTE — Progress Notes (Signed)
Pt. Med. With tylenol 650 mg p.o. for c/o HA "5" on scale 1-10.

## 2020-09-18 NOTE — Brief Op Note (Signed)
BRIEF CARDIAC CATHETERIZATION NOTE  09/18/2020  3:59 PM  PATIENT:  Dawn Garcia  80 y.o. female  PRE-OPERATIVE DIAGNOSIS:  Shortness of breath and chest pain  POST-OPERATIVE DIAGNOSIS:  Same  PROCEDURE:  Procedure(s): RIGHT/LEFT HEART CATH AND CORONARY ANGIOGRAPHY (N/A)  SURGEON:  Surgeon(s) and Role:    * Cailen Texeira, MD - Primary  FINDINGS: Severe single-vessel coronary artery disease with 70% ostial/proximal stenosis of small diagonal branch, similar to prior catheterization. Mild to moderate, non-obstructive CAD, LCx, and RCA disease. Patent LCx/OM stent with moderate ISR (30-40%). Mildly elevated left heart filling pressure. Mild pulmonary hypertension. Mildly reduced Fick cardiac output/index.  RECOMMENDATIONS: Secondary prevention of CAD. Increase torsemide to 20 mg daily. Add amlodipine 2.5 mg daily. If no bleeding or vascular injury, restart apixaban tomorrow morning.  Nelva Bush, MD [facility]

## 2020-09-18 NOTE — Interval H&P Note (Signed)
History and Physical Interval Note:  09/18/2020 2:50 PM  Dawn Garcia  has presented today for surgery, with the diagnosis of chest pain and shortness of breath.  The various methods of treatment have been discussed with the patient and family. After consideration of risks, benefits and other options for treatment, the patient has consented to  Procedure(s): RIGHT/LEFT HEART CATH AND CORONARY ANGIOGRAPHY (N/A) as a surgical intervention.  The patient's history has been reviewed, patient examined, no change in status, stable for surgery.  I have reviewed the patient's chart and labs.  Questions were answered to the patient's satisfaction.    Cath Lab Visit (complete for each Cath Lab visit)  Clinical Evaluation Leading to the Procedure:   ACS: No.  Non-ACS:    Anginal Classification: CCS IV  Anti-ischemic medical therapy: Minimal Therapy (1 class of medications)  Non-Invasive Test Results: Low-risk stress test findings: cardiac mortality <1%/year  Prior CABG: No previous CABG  Dawn Garcia

## 2020-09-20 LAB — CUP PACEART REMOTE DEVICE CHECK
Battery Remaining Longevity: 62 mo
Battery Remaining Percentage: 53 %
Battery Voltage: 2.99 V
Brady Statistic AP VP Percent: 46 %
Brady Statistic AP VS Percent: 21 %
Brady Statistic AS VP Percent: 3.1 %
Brady Statistic AS VS Percent: 29 %
Brady Statistic RA Percent Paced: 63 %
Brady Statistic RV Percent Paced: 48 %
Date Time Interrogation Session: 20220708040014
Implantable Lead Implant Date: 20170829
Implantable Lead Implant Date: 20170829
Implantable Lead Location: 753859
Implantable Lead Location: 753860
Implantable Pulse Generator Implant Date: 20170829
Lead Channel Impedance Value: 410 Ohm
Lead Channel Impedance Value: 550 Ohm
Lead Channel Pacing Threshold Amplitude: 0.75 V
Lead Channel Pacing Threshold Amplitude: 1.125 V
Lead Channel Pacing Threshold Pulse Width: 0.4 ms
Lead Channel Pacing Threshold Pulse Width: 0.4 ms
Lead Channel Sensing Intrinsic Amplitude: 12 mV
Lead Channel Sensing Intrinsic Amplitude: 2.7 mV
Lead Channel Setting Pacing Amplitude: 2.125
Lead Channel Setting Pacing Amplitude: 2.5 V
Lead Channel Setting Pacing Pulse Width: 0.4 ms
Lead Channel Setting Sensing Sensitivity: 2 mV
Pulse Gen Model: 2272
Pulse Gen Serial Number: 7945290

## 2020-09-21 ENCOUNTER — Encounter: Payer: Self-pay | Admitting: Internal Medicine

## 2020-09-24 ENCOUNTER — Encounter: Payer: Self-pay | Admitting: Internal Medicine

## 2020-09-29 ENCOUNTER — Encounter: Payer: Self-pay | Admitting: Internal Medicine

## 2020-09-29 ENCOUNTER — Other Ambulatory Visit: Payer: Self-pay

## 2020-09-29 ENCOUNTER — Ambulatory Visit: Payer: Medicare Other | Admitting: Internal Medicine

## 2020-09-29 DIAGNOSIS — I495 Sick sinus syndrome: Secondary | ICD-10-CM

## 2020-09-29 DIAGNOSIS — Z950 Presence of cardiac pacemaker: Secondary | ICD-10-CM

## 2020-09-29 DIAGNOSIS — R001 Bradycardia, unspecified: Secondary | ICD-10-CM

## 2020-09-29 LAB — PACEMAKER DEVICE OBSERVATION

## 2020-09-29 NOTE — Progress Notes (Signed)
ELECTROPHYSIOLOGY OFFICE  NOTE  Patient ID: Dawn Garcia, MRN: [id number], DOB/AGE: 1940/07/16 80 y.o. Admit date: (Not on file) Date of Consult: 09/29/2020  Primary Physician: Perrin Maltese, MD Primary Cardiologist: tg      HPI Dawn Garcia is a 80 y.o. female  Seen in followup for pacing by Dr. 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 This was complicated by right atrial lead dislodgment requiring revision.  She has recurrent atrial fib; LH and orthostatic tachycardia assoc with Orthostatic hypotension treated with midodrine,  adjunctive mestinon and changes in her diuretics.  She has a history of a  PVI 2013 and repeat7/19.  Thromboembolic risk factors ( age  -2, DM-1, Vasc disease -1, CHF-1, Gender-1) for a CHADSVASc Score of 6  Today, the patient denies nocturnal dyspnea, orthopnea or peripheral edema.  There have been no palpitations, and syncope.  Complains of Dyspnea intermittent. Her lightheadedness  has improved and has not worsened with the intercurrent introduction of amlodipine started for persistent blood pressures over 200.  Has been associated with headaches more recently blood pressures have been 130.  Recurrent chest discomfort.  Cath as noted belo   No bleeding on the Eliquis DATE TEST EF   6/17    Echo  65 %   11/17    Myoview   54 % Anteroapical defect  8/18 Echo   50-55%  possible wall motion abnormality-reviewed by TG no further workup necessary  2/19 Cath    moderate mid-to distal LAD with patent circumflex stent//LVEDP 8//RA 11-notably patient was in sinus rhythm  2/19` Echo    50-55%   5/22 Echo  50-55% LVH  6/22 Myoview  57% Prior infarct  7/22 LHC  1 V CAD D1-70%--Cx Stent ISR 30-40%     Date Cr K TSH Hgb  12/17      12/18 1.19 3.9     1/19 1.69 3.1  16.4  2/19 1.69 4.3    7/19 1.36 3.9  12.6  1/20   0.34   8/20 0.9 3.5    5/21   1.27   6/22 1.23 4.3 0.88 (1/22) 14.2 (4/22)    Past Medical History:  Diagnosis Date    Age-related macular degeneration, dry, right eye    Age-related macular degeneration, wet, left eye ([place])    Arthritis    "all over; particularly in hands/fingers; all my joints" (10/05/2017)   Atrial fibrillation (HCC)    CAD (coronary artery disease)    CHF (congestive heart failure) ([place])     A-Fib   Chronic kidney disease    COPD (chronic obstructive pulmonary disease) ([place])    "very mild" (10/05/2017)   Family history of adverse reaction to anesthesia    "daughter w/PONV & woke up during endoscopy" (10/05/2017)   GERD (gastroesophageal reflux disease)    Gout    Graves' disease    S/P "radioactive tx"   Heart disease    History of blood transfusion    "related to shoulder replacement"    History of gout    "no longer on daily RX" (10/05/2017)   History of hiatal hernia    Hyperlipidemia    Hypertension    Hypocalcemia    Hypokalemia    Hypothyroidism    Macular degeneration    Melanoma ([place]) ~ 2012   "off my back" (10/05/2017)   Myocardial infarction [facility])    "was told I'd had one; don't really know when" (10/05/2017)   Orthostatic hypotension 2020  OSA on CPAP    Pacemaker    Presence of permanent cardiac 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   Renal disorder    Scarring of lung 04/2017   "found by radiology" (10/05/2017)   Sinus node dysfunction (HCC)    Type II diabetes mellitus ([place])    Vitamin B12 deficiency    Vitamin D deficiency       Surgical History:  Past Surgical History:  Procedure Laterality Date   APPENDECTOMY     ATRIAL FIBRILLATION ABLATION N/A 10/05/2017   Procedure: ATRIAL FIBRILLATION ABLATION;  Surgeon: Constance Haw, MD;  Location: [place] [facility];  Service: Cardiovascular;  Laterality: N/A;   ATRIAL FIBRILLATION ABLATION  ~ 2014   CARDIAC CATHETERIZATION  04/2017   CARDIOVERSION N/A 06/22/2020   Procedure: CARDIOVERSION;  Surgeon: Nelva Bush, MD;  Location: [facility];  Service: Cardiovascular;  Laterality: N/A;   COLONOSCOPY W/ BIOPSIES AND  POLYPECTOMY  2015   1 polyp removed- repeat 3 years   CORONARY ANGIOPLASTY WITH STENT PLACEMENT  ~ 2014   EP IMPLANTABLE DEVICE N/A 11/10/2015   Procedure: Pacemaker Implant;  Surgeon: Will [name], MD;  Location: [place] [facility];  Service: Cardiovascular;  Laterality: N/A;   EP IMPLANTABLE DEVICE N/A 11/11/2015   Procedure: Lead Revision/Repair;  Surgeon: Evans Lance, MD;  Location: [place] [facility];  Service: Cardiovascular;  Laterality: N/A;   INGUINAL HERNIA REPAIR Left 1951   JOINT REPLACEMENT     LAPAROSCOPIC CHOLECYSTECTOMY     MELANOMA EXCISION  ~ 2012   "off my back" (10/05/2017)   PARATHYROIDECTOMY     "removed 3 glands; still have 1 gland" (10/05/2017)   RIGHT/LEFT HEART CATH AND CORONARY ANGIOGRAPHY N/A 05/03/2017   Procedure: RIGHT/LEFT HEART CATH AND CORONARY ANGIOGRAPHY;  Surgeon: Leonie Man, MD;  Location: [place] [facility];  Service: Cardiovascular;  Laterality: N/A;   RIGHT/LEFT HEART CATH AND CORONARY ANGIOGRAPHY N/A 09/18/2020   Procedure: RIGHT/LEFT HEART CATH AND CORONARY ANGIOGRAPHY;  Surgeon: Nelva Bush, MD;  Location: [place] [facility];  Service: Cardiovascular;  Laterality: N/A;   TONSILLECTOMY     TOTAL SHOULDER ARTHROPLASTY Right x 2   VAGINAL HYSTERECTOMY     "total"     Home Meds:  Current Meds  Medication Sig   acetaminophen (TYLENOL) 650 MG CR tablet Take 650 mg by mouth at bedtime.   amLODipine (NORVASC) 5 MG tablet Take 0.5 tablets (2.5 mg total) by mouth daily.   apixaban (ELIQUIS) 5 MG TABS tablet Take 1 tablet (5 mg total) by mouth 2 (two) times daily.   Ascorbic Acid (VITAMIN C PO) Take 500 mg by mouth daily.   carboxymethylcellulose (REFRESH PLUS) 0.5 % SOLN Place 1 drop into both eyes daily as needed (dry eyes).   Cholecalciferol (VITAMIN D3) 5000 units TABS Take 5,000 Units by mouth every morning.   Continuous Blood Gluc Receiver (DEXCOM G6 RECEIVER) DEVI Use as instructed to check blood sugar daily.   Continuous Blood  Gluc Sensor (DEXCOM G6 SENSOR) MISC Use as instructed to check blood sugar daily   Continuous Blood Gluc Transmit (DEXCOM G6 TRANSMITTER) MISC Use as instructed to check blood sugar.   diltiazem (CARDIZEM) 30 MG tablet Take 1 tablet every 4 hours AS NEEDED for heart rate >100 (Patient taking differently: Take 30 mg by mouth See admin instructions. Take 1 tablet every 4 hours AS NEEDED for heart rate >100)   docusate sodium (COLACE) 100 MG capsule Take 100 mg by mouth daily.  Evolocumab (REPATHA) 140 MG/ML SOSY Inject 140 mg into the skin every 14 (fourteen) days.   fenofibrate 54 MG tablet Take 54 mg by mouth daily.   glucose blood test strip Use as instructed to check blood sugar 2 times daily   insulin degludec (TRESIBA FLEXTOUCH) 200 UNIT/ML FlexTouch Pen Inject 8 Units into the skin daily. (Patient taking differently: Inject 10 Units into the skin daily.)   Insulin Pen Needle (PEN NEEDLES) 32G X 4 MM MISC Use to inject into the skin daily   Insulin Syringe-Needle U-100 (INSULIN SYRINGE .3CC/31GX5/16") 31G X 5/16" 0.3 ML MISC Use to inject insulin twice daily   levothyroxine (SYNTHROID) 112 MCG tablet Take 1 tablet (112 mcg total) by mouth daily.   losartan (COZAAR) 25 MG tablet Take 25 mg by mouth 2 (two) times daily.   losartan (COZAAR) 50 MG tablet Take 1 tablet (50 mg total) by mouth daily.   Multiple Vitamins-Minerals (ICAPS AREDS 2) CAPS Take 1 capsule by mouth 2 (two) times daily.   nitroGLYCERIN (NITROSTAT) 0.4 MG SL tablet Place 1 tablet (0.4 mg total) under the tongue every 5 (five) minutes as needed for chest pain.   pantoprazole (PROTONIX) 40 MG tablet Take 1 tablet (40 mg total) by mouth daily.   polyethylene glycol (MIRALAX / GLYCOLAX) 17 g packet Take 17 g by mouth daily as needed for moderate constipation.   potassium chloride (KLOR-CON) 10 MEQ tablet Take 1 tablet (10 mEq total) by mouth daily. Take 1 tablet (37mEq) once per day when taking Torsemide.   Semaglutide, 2 MG/DOSE,  (OZEMPIC, 2 MG/DOSE,) 8 MG/3ML SOPN Inject 2 mg into the skin once a week.   torsemide (DEMADEX) 10 MG tablet Take 2 tablets (20 mg total) by mouth daily. Take 1 tablet (10mg ) daily.   vitamin B-12 (CYANOCOBALAMIN) 500 MCG tablet Take 500 mcg by mouth daily.   Current Facility-Administered Medications for the 09/29/20 encounter (Office Visit) with Deboraha Sprang, MD  Medication   sodium chloride flush (NS) 0.9 % injection 3 mL      Allergies:  Allergies  Allergen Reactions   Demerol [Meperidine] Nausea And Vomiting    Tolerated Fentanyl 11/10/15   Epinephrine Shortness Of Breath and Palpitations    Powder   Multaq [Dronedarone] Other (See Comments)    Kidney failure and lung problems   Sulfa Antibiotics Anaphylaxis   Tetracyclines & Related Swelling and Other (See Comments)    Made nose, lips,  And tongue itchy   Quinolones Other (See Comments)    Aortic dilation. Avoid FQ, as these are showing to increase Ao dilation.    Statins     Foggy head, episodic amnesia    Imdur [Isosorbide Nitrate]     HA           Physical Exam: BP 130/80 (BP Location: Left Arm, Patient Position: Sitting, Cuff Size: Normal)   Pulse (!) 55   Ht 5\' 2"  (1.575 m)   Wt 177 lb 12.8 oz (80.6 kg)   SpO2 98%   BMI 32.52 kg/m  Well developed and well nourished in no acute distress HENT normal Neck supple with JVP-flat Lungs Clear Device pocket well healed; without hematoma or erythema.  There is no tethering  Regular rate and rhythm, No gallop No  murmur Abd-soft with active BS No Clubbing cyanosis No edema Skin-warm and dry A & Oriented  Grossly normal sensory and motor function  ECG:    Assessment and Plan:   Sick Sinus syndrome  Persistent Atrial Fib with prior PVI and redo 7/19//Atrial tach  Pacemaker St Jude   OSA  P-wave oversensing resulting false detection of atrial fibrillation  HFpEF   chronic  DOE  Coronary artery disease with prior stenting  Chest pain    Orthostatic intolerance   Pulmonary hypertension-mild  Continues with chest discomfort.  Catheterization demonstrated nonobstructive disease except in diagonal.  Prior stent is patent.  Remains on Repatha.  Continue every 2 weeks  Blood pressure is much improved.  We will continue the amlodipine 2.5.  Losartan 50.  Continue Demadex 40 daily, has not had a big problem with orthostasis.  For right now her pyridostigmine is on hold.  Can resume if necessary.  Catheterization demonstrated mild pulmonary hypertension with a PVR of about 3 Woods units.  This may be further improved with diuresis.  No specific therapy is indicated although the calcium blocker may be of some benefit.  We will discuss with Dr. Amanda Cockayne as a scribe for Virl Axe, MD.,have documented all relevant documentation on the behalf of Virl Axe, MD,as directed by  Virl Axe, MD while in the presence of Virl Axe, MD.  I, Virl Axe, MD, have reviewed all documentation for this visit. The documentation on 09/29/20 for the exam, diagnosis, procedures, and orders are all accurate and complete.

## 2020-09-29 NOTE — Progress Notes (Signed)
ELECTROPHYSIOLOGY OFFICE  NOTE  Patient ID: Dawn Garcia, MRN: [id number], DOB/AGE: February 01, 1941 80 y.o. Admit date: (Not on file) Date of Consult: 09/29/2020  Primary Physician: Perrin Maltese, MD Primary Cardiologist: tg       HPI Dawn Garcia is a 80 y.o. female  Seen in followup for pacing by Dr. 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 This was complicated by right atrial lead dislodgment requiring revision.  She has recurrent atrial fib; LH and orthostatic tachycardia assoc with Orthostatic hypotension treated with midodrine,  adjunctive mestinon and changes in her diuretics.  She has a history of a  PVI 2013 and repeat7/19.   Some chest pains, lasting minutes to hours, often assoc with irregular pulse on pulse  Significant SOB DOE  Minimal LH  No edema   DATE TEST EF   6/17    Echo  65 %   11/17    Myoview   54 % Anteroapical defect  8/18 Echo   50-55%  possible wall motion abnormality-reviewed by TG no further workup necessary  2/19 Cath    moderate mid-to distal LAD with patent circumflex stent//LVEDP 8//RA 11-notably patient was in sinus rhythm  2/19` Echo    50-55%   5/22 Echo  50-55% LVH  6/22 Myoview  57% Prior infarct  7/22 LHC  1 V CAD D1-70%--Cx Stent ISR 30-40%      Date Cr K TSH Hgb  12/17      12/18 1.19 3.9     1/19 1.69 3.1  16.4  2/19 1.69 4.3    7/19 1.36 3.9  12.6  1/20   0.34   8/20 0.9 3.5    5/21   1.27   6/22 1.23 4.3 0.88 (1/22) 14.2 (4/22)       Thromboembolic risk factors ( age  -2, DM-1, Vasc disease -1, CHF-1, Gender-1) for a CHADSVASc Score of 6      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[place])    Arthritis    "all over; particularly in hands/fingers; all my joints" (10/05/2017)   Atrial fibrillation (HCC)    CAD (coronary artery disease)    CHF (congestive heart failure) ([place])     A-Fib   Chronic kidney  disease    COPD (chronic obstructive pulmonary disease) ([place])    "very mild" (10/05/2017)   Family history of adverse reaction to anesthesia    "daughter w/PONV & woke up during endoscopy" (10/05/2017)   GERD (gastroesophageal reflux disease)    Gout    Graves' disease    S/P "radioactive tx"   Heart disease    History of blood transfusion    "related to shoulder replacement"    History of gout    "no longer on daily RX" (10/05/2017)   History of hiatal hernia    Hyperlipidemia    Hypertension    Hypocalcemia    Hypokalemia    Hypothyroidism    Macular degeneration    Melanoma ([place]) ~ 2012   "off my back" (10/05/2017)   Myocardial infarction [facility])    "was told I'd had one; don't really know when" (10/05/2017)   Orthostatic hypotension 2020   OSA on CPAP    Pacemaker    Presence of permanent cardiac 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   Renal disorder    Scarring of lung 04/2017   "found by radiology" (10/05/2017)   Sinus  node dysfunction (HCC)    Type II diabetes mellitus ([place])    Vitamin B12 deficiency    Vitamin D deficiency       Surgical History:  Past Surgical History:  Procedure Laterality Date   APPENDECTOMY     ATRIAL FIBRILLATION ABLATION N/A 10/05/2017   Procedure: ATRIAL FIBRILLATION ABLATION;  Surgeon: Constance Haw, MD;  Location: [place] [facility];  Service: Cardiovascular;  Laterality: N/A;   ATRIAL FIBRILLATION ABLATION  ~ 2014   CARDIAC CATHETERIZATION  04/2017   CARDIOVERSION N/A 06/22/2020   Procedure: CARDIOVERSION;  Surgeon: Nelva Bush, MD;  Location: [facility] ORS;  Service: Cardiovascular;  Laterality: N/A;   COLONOSCOPY W/ BIOPSIES AND POLYPECTOMY  2015   1 polyp removed- repeat 3 years   CORONARY ANGIOPLASTY WITH STENT PLACEMENT  ~ 2014   EP IMPLANTABLE DEVICE N/A 11/10/2015   Procedure: Pacemaker Implant;  Surgeon: Will [name], MD;  Location: [place] [facility];  Service: Cardiovascular;  Laterality: N/A;   EP IMPLANTABLE DEVICE N/A 11/11/2015    Procedure: Lead Revision/Repair;  Surgeon: Evans Lance, MD;  Location: [place] [facility];  Service: Cardiovascular;  Laterality: N/A;   INGUINAL HERNIA REPAIR Left 1951   JOINT REPLACEMENT     LAPAROSCOPIC CHOLECYSTECTOMY     MELANOMA EXCISION  ~ 2012   "off my back" (10/05/2017)   PARATHYROIDECTOMY     "removed 3 glands; still have 1 gland" (10/05/2017)   RIGHT/LEFT HEART CATH AND CORONARY ANGIOGRAPHY N/A 05/03/2017   Procedure: RIGHT/LEFT HEART CATH AND CORONARY ANGIOGRAPHY;  Surgeon: Leonie Man, MD;  Location: [place] [facility];  Service: Cardiovascular;  Laterality: N/A;   RIGHT/LEFT HEART CATH AND CORONARY ANGIOGRAPHY N/A 09/18/2020   Procedure: RIGHT/LEFT HEART CATH AND CORONARY ANGIOGRAPHY;  Surgeon: Nelva Bush, MD;  Location: [place] [facility];  Service: Cardiovascular;  Laterality: N/A;   TONSILLECTOMY     TOTAL SHOULDER ARTHROPLASTY Right x 2   VAGINAL HYSTERECTOMY     "total"     Home Meds:  No outpatient medications have been marked as taking for the 09/29/20 encounter (Appointment) with Deboraha Sprang, MD.   Current Facility-Administered Medications for the 09/29/20 encounter (Appointment) with Deboraha Sprang, MD  Medication   sodium chloride flush (NS) 0.9 % injection 3 mL      Allergies:  Allergies  Allergen Reactions   Demerol [Meperidine] Nausea And Vomiting    Tolerated Fentanyl 11/10/15   Epinephrine Shortness Of Breath and Palpitations    Powder   Multaq [Dronedarone] Other (See Comments)    Kidney failure and lung problems   Sulfa Antibiotics Anaphylaxis   Tetracyclines & Related Swelling and Other (See Comments)    Made nose, lips,  And tongue itchy   Quinolones Other (See Comments)    Aortic dilation. Avoid FQ, as these are showing to increase Ao dilation.    Statins     Foggy head, episodic amnesia    Imdur [Isosorbide Nitrate]     HA    Social History   Socioeconomic History   Marital status: Married    Spouse name: Dick    Number of children: 3   Years of education: Not on file   Highest education level: Master's degree (e.g., MA, MS, MEng, MEd, MSW, MBA)  Occupational History   Occupation: Retired    Comment: Therapist, sports  Tobacco Use   Smoking status: Former    Packs/day: 2.00    Years: 3.00    Pack years:  6.00    Types: Cigarettes    Quit date: 09/25/1968    Years since quitting: 52.0   Smokeless tobacco: Never   Tobacco comments:    10/05/2017 "smoked in my 20's"  Vaping Use   Vaping Use: Never used  Substance and Sexual Activity   Alcohol use: Not Currently    Comment: 10/05/2017 "1 mixed drink q 2-3 months"   Drug use: Never   Sexual activity: Not on file  Other Topics Concern   Not on file  Social History Narrative   Moved from [facility] - originally from [place] - daughter lives in [place]   03/20/19 Lives in [place] w/ husband - he is demented- fairly functional   Retired Therapist, sports, Scientist, physiological also - variety of experiences   2 sons 1 daughter   Social Determinants of Radio broadcast assistant Strain: Not on Art therapist Insecurity: Not on file  Transportation Needs: Not on file  Physical Activity: Not on file  Stress: Not on file  Social Connections: Not on file  Intimate Partner Violence: Not on file     Family History  Problem Relation Age of Onset   Thyroid disease Mother 55       3 days after surgery   Heart defect Father 35       ?ascending aortic aneurysm   Breast cancer Sister    Hepatitis Brother    Colon cancer Maternal Grandmother    Diabetes Maternal Grandmother    Diabetes Maternal Grandfather    Colon cancer Paternal Grandmother        73's   Rectal cancer Son 8   Breast cancer Half-Sister    Diabetes Paternal Uncle    Dementia Paternal Uncle    Colon polyps Neg Hx    Stomach cancer Neg Hx    Liver cancer Neg Hx    Pancreatic cancer Neg Hx      ROS:  Please see the history of present illness.     All other systems reviewed and negative.  There were no vitals taken for  this visit. Well developed and well nourished in no acute distress HENT normal Neck supple with JVP-flat Clear Device pocket well healed; without hematoma or erythema.  There is no tethering  Regular rate and rhythm, no murmur Abd-soft with active BS No Clubbing cyanosis   edema Skin-warm and dry A & Oriented  Grossly normal sensory and motor function  ECG a pacing 55 31[birth year]51 TW inversionsV3-6  New    Labs: Cardiac Enzymes No results for input(s): CKTOTAL, CKMB, TROPONINI in the last 72 hours. CBC Lab Results  Component Value Date   WBC 5.5 08/31/2020   HGB 13.6 08/31/2020   HCT 40.7 08/31/2020   MCV 87 08/31/2020   PLT 211 08/31/2020   PROTIME: No results for input(s): LABPROT, INR in the last 72 hours. Chemistry  No results for input(s): NA, K, CL, CO2, BUN, CREATININE, CALCIUM, PROT, BILITOT, ALKPHOS, ALT, AST, GLUCOSE in the last 168 hours.  Invalid input(s): LABALBU Lipids Lab Results  Component Value Date   CHOL 219 (H) 08/20/2020   HDL 41 08/20/2020   LDLCALC 124 (H) 08/20/2020   TRIG 269 (H) 08/20/2020   BNP No results found for: PROBNP Thyroid Function Tests: No results for input(s): TSH, T4TOTAL, T3FREE, THYROIDAB in the last 72 hours.  Invalid input(s): FREET3 Miscellaneous Lab Results  Component Value Date   DDIMER <0.27 04/28/2017    Radiology/Studies:  CARDIAC CATHETERIZATION  Result Date: 09/18/2020  Conclusions: 1. Severe single-vessel coronary artery disease with 70% stenosis involving small, bifurcating D2 branch that is similar to prior catheterization in 2019.  Mild to moderate, nonobstructive LAD disease also appears similar. 2. Patent LCx/OM1 stent with 30-40% proximal in-stent restenosis, new from 2019. 3. Upper normal left heart filling pressures. 4. Mildly elevated pulmonary artery and right heart pressures. 5. Mildly reduced Fick cardiac output/index. 6. Marked systemic hypertension with systolic pressures greater than 200 mmHg.  Recommendations: 1. Escalate diuresis; increase torsemide to 20 mg daily.  BMP should be checked when Ms. Robb is seen for follow-up by Dr. Caryl Comes or Ms. Visser later this month. 2. Add amlodipine 2.5 mg daily for improved blood pressure control and antianginal therapy. 3. Secondary prevention of coronary artery disease.  Recommend medical therapy of D2 branch, which is too small for PCI. Nelva Bush, MD [facility]  CUP PACEART REMOTE DEVICE CHECK  Result Date: 09/20/2020 Scheduled remote reviewed. Normal device function.  Known PAF, on OAC, AF burden is 8% of the time Next remote 91 days. Kathy Breach, RN, CCDS, [employer]      Assessment and Plan:  Sick Sinus syndrome  Persistent Atrial Fib with prior PVI and redo 7/19//Atrial tach  Pacemaker St Jude   OSA  P-wave oversensing resulting false detection of atrial fibrillation  HFpEF   chronic  DOE  Coronary artery disease with prior stenting  Chest pain   Orthostatic intolerance    Continues with some Afib/tach burden aobut 6%   Chest pain is very atypical but her changed ECG is concerning for ischemia, although T wave memory would be my first Impression  Will get myoview scan  Stopped rosuvastatin 2/2 memory issues  She thinks  The got better will check LDL  OI largely controlled   Dyspnea on exertion could be anginal equivalent, she also has chronotropic incompetence with demonstrable decrease in HR >70 from 35% ( 1/20) >>15%   Activated rate response  Scant fluid using diuretics only prn        Virl Axe

## 2020-09-29 NOTE — Patient Instructions (Signed)
Medication Instructions:  Your physician recommends that you continue on your current medications as directed. Please refer to the Current Medication list given to you today.  *If you need a refill on your cardiac medications before your next appointment, please call your pharmacy*   Lab Work: BMET today  If you have labs (blood work) drawn today and your tests are completely normal, you will receive your results only by: [place] (if you have MyChart) OR A paper copy in the mail If you have any lab test that is abnormal or we need to change your treatment, we will call you to review the results.   Testing/Procedures: None ordered.    Follow-Up: At [facility], you and your health needs are our priority.  As part of our continuing mission to provide you with exceptional heart care, we have created designated Provider Care Teams.  These Care Teams include your primary Cardiologist (physician) and Advanced Practice Providers (APPs -  Physician Assistants and Nurse Practitioners) who all work together to provide you with the care you need, when you need it.  We recommend signing up for the patient portal called "MyChart".  Sign up information is provided on this After Visit Summary.  MyChart is used to connect with patients for Virtual Visits (Telemedicine).  Patients are able to view lab/test results, encounter notes, upcoming appointments, etc.  Non-urgent messages can be sent to your provider as well.   To learn more about what you can do with MyChart, go to [email or web address].    Your next appointment:   6 month(s)  The format for your next appointment:   In Person  Provider:   Virl Axe, MD

## 2020-09-30 LAB — BASIC METABOLIC PANEL
BUN/Creatinine Ratio: 15 (ref 12–28)
BUN: 20 mg/dL (ref 8–27)
CO2: 20 mmol/L (ref 20–29)
Calcium: 9.5 mg/dL (ref 8.7–10.3)
Chloride: 105 mmol/L (ref 96–106)
Creatinine, Ser: 1.36 mg/dL — ABNORMAL HIGH (ref 0.57–1.00)
Glucose: 98 mg/dL (ref 65–99)
Potassium: 4 mmol/L (ref 3.5–5.2)
Sodium: 143 mmol/L (ref 134–144)
eGFR: 40 mL/min/{1.73_m2} — ABNORMAL LOW (ref >59)

## 2020-10-02 ENCOUNTER — Telehealth: Payer: Self-pay | Admitting: Physician Assistant

## 2020-10-02 DIAGNOSIS — J449 Chronic obstructive pulmonary disease, unspecified: Secondary | ICD-10-CM

## 2020-10-02 DIAGNOSIS — I272 Pulmonary hypertension, unspecified: Secondary | ICD-10-CM

## 2020-10-02 DIAGNOSIS — G4733 Obstructive sleep apnea (adult) (pediatric): Secondary | ICD-10-CM

## 2020-10-02 NOTE — Telephone Encounter (Addendum)
Spoke to [place], [place]. She said Dr. Saunders Revel recommended the pt see Dr. Patsey Berthold, which is the reason for the need to change from Dr. Mortimer Fries. Also, original referral was for Sleep eval.  Called [facility]. Explained that the pt does not want to proceed with sleep referral and instead would like to see Dr. Patsey Berthold for Pulmonary HTN per Dr. Darnelle Bos recommendations. They removed the Sleep referral and cancelled appt with Dr. Mortimer Fries. They asked that a new referral be placed for Dr. Patsey Berthold and they would reach out to Ms. Kyler to schedule an appt. Thanked them for their help.  New referral placed.  Called Ms. Ohlinger back to inform her Pulmonary will be reaching out to her to schedule appt with Dr. Patsey Berthold and new referral has been placed, old appt with Dr. Mortimer Fries cancelled. Pt verbalized thanks for the help and stated this was all she needed.   Routing to [employer], [place] and [place], Therapist, sports as Juluis Rainier.

## 2020-10-02 NOTE — Telephone Encounter (Signed)
Message sent to [name], PA to advise before calling the pt.

## 2020-10-02 NOTE — Telephone Encounter (Signed)
Patient calling to discuss referral.  She was told to see Dr. Patsey Berthold for [facility] and pulm could not change dur to active sleep referral with Dr. Mortimer Fries.   Patient wants to cancel sleep referral and see Patsey Berthold as recommended .    Please advise .  Patient interpreting that Dr. Mortimer Fries does not manage [facility] patients and she is scheduled with the wrong provider.   Please call to discuss. See also mychart msg

## 2020-10-05 NOTE — Telephone Encounter (Signed)
Discussed with pulmonary scheduling. Pt to be scheduled with Dr. Patsey Berthold.

## 2020-10-06 ENCOUNTER — Encounter: Payer: Self-pay | Admitting: Physician Assistant

## 2020-10-06 ENCOUNTER — Other Ambulatory Visit
Admission: RE | Admit: 2020-10-06 | Discharge: 2020-10-06 | Disposition: A | Discharge status: Home / Self Care | Payer: Medicare Other | Source: Ambulatory Visit | Attending: Physician Assistant | Admitting: Physician Assistant

## 2020-10-06 ENCOUNTER — Other Ambulatory Visit: Payer: Self-pay

## 2020-10-06 ENCOUNTER — Ambulatory Visit: Payer: Medicare Other | Admitting: Physician Assistant

## 2020-10-06 VITALS — BP 140/78 | HR 58 | Ht 62.0 in | Wt 180.0 lb

## 2020-10-06 DIAGNOSIS — G4733 Obstructive sleep apnea (adult) (pediatric): Secondary | ICD-10-CM

## 2020-10-06 DIAGNOSIS — E876 Hypokalemia: Secondary | ICD-10-CM

## 2020-10-06 DIAGNOSIS — J449 Chronic obstructive pulmonary disease, unspecified: Secondary | ICD-10-CM

## 2020-10-06 DIAGNOSIS — I272 Pulmonary hypertension, unspecified: Secondary | ICD-10-CM

## 2020-10-06 DIAGNOSIS — E785 Hyperlipidemia, unspecified: Secondary | ICD-10-CM

## 2020-10-06 DIAGNOSIS — I5033 Acute on chronic diastolic (congestive) heart failure: Secondary | ICD-10-CM | POA: Diagnosis not present

## 2020-10-06 DIAGNOSIS — I480 Paroxysmal atrial fibrillation: Secondary | ICD-10-CM

## 2020-10-06 DIAGNOSIS — I495 Sick sinus syndrome: Secondary | ICD-10-CM

## 2020-10-06 DIAGNOSIS — Z950 Presence of cardiac pacemaker: Secondary | ICD-10-CM

## 2020-10-06 DIAGNOSIS — I10 Essential (primary) hypertension: Secondary | ICD-10-CM

## 2020-10-06 DIAGNOSIS — Z789 Other specified health status: Secondary | ICD-10-CM

## 2020-10-06 DIAGNOSIS — I340 Nonrheumatic mitral (valve) insufficiency: Secondary | ICD-10-CM

## 2020-10-06 LAB — BASIC METABOLIC PANEL
Anion gap: 9 (ref 5–15)
BUN: 22 mg/dL (ref 8–23)
CO2: 27 mmol/L (ref 22–32)
Calcium: 9.4 mg/dL (ref 8.9–10.3)
Chloride: 106 mmol/L (ref 98–111)
Creatinine, Ser: 1.37 mg/dL — ABNORMAL HIGH (ref 0.44–1.00)
GFR, Estimated: 39 mL/min — ABNORMAL LOW (ref >60)
Glucose, Bld: 104 mg/dL — ABNORMAL HIGH (ref 70–99)
Potassium: 4.1 mmol/L (ref 3.5–5.1)
Sodium: 142 mmol/L (ref 135–145)

## 2020-10-06 MED ORDER — TORSEMIDE 20 MG PO TABS: 30.0000 mg | ORAL_TABLET | Freq: Every day | ORAL | 5 refills | Status: DC

## 2020-10-06 MED ORDER — POTASSIUM CHLORIDE ER 10 MEQ PO TBCR: 15.0000 meq | EXTENDED_RELEASE_TABLET | Freq: Every day | ORAL | 5 refills | Status: DC

## 2020-10-06 NOTE — Progress Notes (Signed)
Office Visit    Patient Name: Dawn Garcia Dawn Garcia Date of Encounter: 10/06/2020  PCP:  Dawn Maltese, MD   [place]  Cardiologist: Dr. Saunders Garcia Advanced Practice Provider:  No care team Garcia to display Electrophysiologist: Dr. Caryl Garcia  Chief Complaint    Chief Complaint  Patient presents with   Follow-up    F/U after cardiac cath; Patient states she "does not feel like 20 mg of torsemide is doing very much"-would like to discuss increasing the dosage.     80 y.o. female with history of CAD with remote stent and patent stent by catheterization in 2014, paroxysmal atrial fibrillation s/p reported ablation in 2013 and repeat atrial fibrillation ablation [id number], chronic diastolic CHF, COPD, CKD, DM2, orthostatic hypotension, hyperlipidemia, hypothyroidism, reported history of bradycardia / 5-second pauses on cardiac monitoring with dual-chamber 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 for sick sinus syndrome/tachybradycardia syndrome and RA lead revision due to dislodgment, OSA, and here today for follow-up of recent cath.  Past Medical History    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[place])    Arthritis    "all over; particularly in hands/fingers; all my joints" (10/05/2017)   Atrial fibrillation (HCC)    CAD (coronary artery disease)    CHF (congestive heart failure) ([place])     A-Fib   Chronic kidney disease    COPD (chronic obstructive pulmonary disease) ([place])    "very mild" (10/05/2017)   Family history of adverse reaction to anesthesia    "daughter w/PONV & woke up during endoscopy" (10/05/2017)   GERD (gastroesophageal reflux disease)    Gout    Graves' disease    S/P "radioactive tx"   Heart disease    History of blood transfusion    "related to shoulder replacement"    History of gout    "Dawn longer on daily RX" (10/05/2017)   History of hiatal hernia     Hyperlipidemia    Hypertension    Hypocalcemia    Hypokalemia    Hypothyroidism    Macular degeneration    Melanoma ([place]) ~ 2012   "off my back" (10/05/2017)   Myocardial infarction [facility])    "was told I'd had one; don't really know when" (10/05/2017)   Orthostatic hypotension 2020   OSA on CPAP    Pacemaker    Presence of permanent cardiac 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   Renal disorder    Scarring of lung 04/2017   "found by radiology" (10/05/2017)   Sinus node dysfunction ([place])    Type II diabetes mellitus ([place])    Vitamin B12 deficiency    Vitamin D deficiency    Past Surgical History:  Procedure Laterality Date   APPENDECTOMY     ATRIAL FIBRILLATION ABLATION N/A 10/05/2017   Procedure: ATRIAL FIBRILLATION ABLATION;  Surgeon: Dawn Haw, MD;  Location: Dawn [facility];  Service: Cardiovascular;  Laterality: N/A;   ATRIAL FIBRILLATION ABLATION  ~ 2014   CARDIAC CATHETERIZATION  04/2017   CARDIOVERSION N/A 06/22/2020   Procedure: CARDIOVERSION;  Surgeon: Dawn Bush, MD;  Location: Dawn [facility];  Service: Cardiovascular;  Laterality: N/A;   COLONOSCOPY W/ BIOPSIES AND POLYPECTOMY  2015   1 polyp removed- repeat 3 years   CORONARY ANGIOPLASTY WITH STENT PLACEMENT  ~ 2014   EP IMPLANTABLE DEVICE N/A 11/10/2015   Procedure: Pacemaker Implant;  Surgeon: Dawn [name], MD;  Location: [facility];  Service: Cardiovascular;  Laterality: N/A;   EP IMPLANTABLE DEVICE N/A 11/11/2015   Procedure: Lead Revision/Repair;  Surgeon: Dawn Lance, MD;  Location: [facility];  Service: Cardiovascular;  Laterality: N/A;   INGUINAL HERNIA REPAIR Left 1951   JOINT REPLACEMENT     LAPAROSCOPIC CHOLECYSTECTOMY     MELANOMA EXCISION  ~ 2012   "off my back" (10/05/2017)   PARATHYROIDECTOMY     "removed 3 glands; still have 1 gland" (10/05/2017)   RIGHT/LEFT HEART CATH AND CORONARY ANGIOGRAPHY N/A 05/03/2017   Procedure: RIGHT/LEFT HEART CATH AND CORONARY ANGIOGRAPHY;  Surgeon:  Leonie Man, MD;  Location: Dawn [facility];  Service: Cardiovascular;  Laterality: N/A;   RIGHT/LEFT HEART CATH AND CORONARY ANGIOGRAPHY N/A 09/18/2020   Procedure: RIGHT/LEFT HEART CATH AND CORONARY ANGIOGRAPHY;  Surgeon: Dawn Bush, MD;  Location: Dawn [facility];  Service: Cardiovascular;  Laterality: N/A;   TONSILLECTOMY     TOTAL SHOULDER ARTHROPLASTY Right x 2   VAGINAL HYSTERECTOMY     "total"    Allergies  Allergies  Allergen Reactions   Demerol [Meperidine] Nausea And Vomiting    Tolerated Fentanyl 11/10/15   Epinephrine Shortness Of Breath and Palpitations    Powder   Multaq [Dronedarone] Other (See Comments)    Kidney failure and lung problems   Sulfa Antibiotics Anaphylaxis   Tetracyclines & Related Swelling and Other (See Comments)    Made nose, lips,  And tongue itchy   Quinolones Other (See Comments)    Aortic dilation. Avoid FQ, as these are showing to increase Ao dilation.    Statins     Foggy head, episodic amnesia    Imdur [Isosorbide Nitrate]     HA    History of Present Illness    Dawn Garcia is a 80 y.o. female with PMH as above.  She is followed by the atrial fibrillation clinic in [place], as well as Dr. Caryl Garcia.  She has a long history of atrial fibrillation with ablation in [place] in 2013 and repeat ablation by Dr. Curt Garcia in 2019.  She was on amiodarone in the past but reported this was discontinued due to development of lung issues.  She did not tolerate Multaq.    She underwent DCCV 06/22/2020.   The device clinic received an alert for increased atrial fibrillation burden 06/26/2020.   When seen 06/30/2020, she was back in sinus rhythm.  Lasix was prescribed for wt gain and abdominal bloating.  She has been maintained on anticoagulation with Eliquis 5 mg twice daily and denies any bleeding issues on anticoagulation.  Seen 07/10/2020 with report of intermittent dieting for DM2 and regular follow-up with endocrinology.  She  felt volume up and reported only receipt of a few tablets of Lasix at a previous clinic visit.  She reports taking 20 mg of Lasix daily then 40 mg of Lasix.  She was bloated and felt as if she is holding onto fluid in her stomach.  She reported abdominal aches that were not necessarily connected with food.  She wondered if her Mestinon was contributing.  She reported weight gain and at times reduced appetite though with consideration of intermittent fasting.  Home weight 1 7171 pounds and most recently 176.7 pounds.  She was using her CPAP.  She reported home SBP 120-140 and DBP 50 to 80s.  Lasix was changed to torsemide.  Between visits, she reported ongoing weight gain and feelings of being volume up.  Several attempts to increase volume  output were made with patient report that she was still holding onto volume and not having very much volume output.  As a result, echo obtained with results as below showing EF 50 to 55%, mild LVH, G2 DD, elevated left atrial pressure, mild LAE, mild to moderate MR with degenerative mitral valve, moderate calcification of the aortic valve with trivial AR, mild dilation of the aorta at 37 mm, mildly dilated pulmonary artery.  Seen 08/13/2020 and reported wt gain, bloating, and cramping and uncertain if mestinon was contributing.  Had has GI symptoms with her abdominal bloating.  She noted chest pain, shortness of breath, dyspnea.  She had headache with elevated blood pressure.  She had throat pain with her chest pain.  She also had diaphoresis.  She reported bilateral lower extremity edema, alleviated with diaphoresis. Home BP 177/98 and repeat BP 183/103.   She reported weight as high as 177 pounds.  In clinic, BP 160/92 with HR 57 bpm and oxygen saturation 98%.  Weight 180 pounds.  She wonders what Dawn happen if she goes down on her Mestinon, as she wonders if this is contributing to her elevated blood pressure.  She had recently learned about a family history of heart disease  in her father and is trying to find out more information regarding this history.  He died at the age of 50.  She was uncertain if this was due to AVM versus thrombus.  She reported some personal stressors, including Dawn surrounding her husband who has Alzheimer's. She was not taking her torsemide at time though did note smaller ankles with diuresis.  She was taking as needed MiraLAX for constipation. Recommendation was for MPI.  Subsequent recommendation was also for Repatha and referral to sleep apnea clinic.  MPI showed Dawn significant ischemia.  A small region of fixed defect in the mid to distal anterior septal and apical region was consistent with prior MI.  GI uptake was noted.  Hypokinesis in the mid to distal anterior septal wall was noted.  EF estimated at 57%.  Overall, this was ruled a low risk scan.  Due to ongoing sx, she was scheduled for catheterization with results as below. Cath showed severe 1v CAD with 70%s of small, bifurcating D2 similar to 2019 cath. Mild to moderate nonobstructive CAD in LAD appeared similar. She has patent Lcx and OM1 with 30-40% pISR, new from 2019. LH filling pressures were upper limits of nl. She had mild elevation of pulmonary pressures and RH pressures with mildly reduced CO/CI. Systemic HTN with SBP greater than 244mHg noted during cath. Torsemide increased to '20mg'$  daily, she was started on low dose amlodipine. Medical therapy was recommended for D2 branch, given too small for PCI.  Today, 10/06/20, she returns to clinic and notes she is doing well after the cath though with ongoing SOB / DOE. She wonders about long hauler sx. She is not sleeping well, often only getting 3-4 hours of sleep. She tried melatonin without relief. She reports return of lightheadedness. She reports some new palpitations and chest discomfort with this irregular pulse. She is walking with the dog 2-3 times per day. She denies any recent falls, but she did "sit down hard." She reports her  legs become very tired when walking. She can get a quarter of the way around and then her legs bother her. She reports pain in the upper thighs and hip area. She has abdominal bloating and ongoing LEE. She took torsemide '30mg'$  yesterday and felt improved sx.  She wonders about increasing her lasix further.   Home Medications    Current Outpatient Medications on File Prior to Visit  Medication Sig Dispense Refill   acetaminophen (TYLENOL) 650 MG CR tablet Take 650 mg by mouth at bedtime.     amLODipine (NORVASC) 5 MG tablet Take 0.5 tablets (2.5 mg total) by mouth daily. 30 tablet 5   apixaban (ELIQUIS) 5 MG TABS tablet Take 1 tablet (5 mg total) by mouth 2 (two) times daily. 180 tablet 1   Ascorbic Acid (VITAMIN C PO) Take 500 mg by mouth daily.     carboxymethylcellulose (REFRESH PLUS) 0.5 % SOLN Place 1 drop into both eyes daily as needed (dry eyes).     Cholecalciferol (VITAMIN D3) 5000 units TABS Take 5,000 Units by mouth every morning.     Continuous Blood Gluc Receiver (DEXCOM G6 RECEIVER) DEVI Use as instructed to check blood sugar daily. 1 each 1   Continuous Blood Gluc Sensor (DEXCOM G6 SENSOR) MISC Use as instructed to check blood sugar daily 9 each 3   Continuous Blood Gluc Transmit (DEXCOM G6 TRANSMITTER) MISC Use as instructed to check blood sugar. 1 each 3   diltiazem (CARDIZEM) 30 MG tablet Take 1 tablet every 4 hours AS NEEDED for heart rate >100 30 tablet 1   docusate sodium (COLACE) 100 MG capsule Take 100 mg by mouth daily.     Evolocumab (REPATHA) 140 MG/ML SOSY Inject 140 mg into the skin every 14 (fourteen) days. 2.1 mL 12   fenofibrate 54 MG tablet Take 54 mg by mouth daily.     glucose blood test strip Use as instructed to check blood sugar 2 times daily 200 each 3   Insulin Degludec (TRESIBA FLEXTOUCH [place]) Inject 10 Units into the skin daily.     Insulin Pen Needle (PEN NEEDLES) 32G X 4 MM MISC Use to inject into the skin daily 100 each 3   Insulin Syringe-Needle U-100  (INSULIN SYRINGE .3CC/31GX5/16") 31G X 5/16" 0.3 ML MISC Use to inject insulin twice daily 200 each 3   levothyroxine (SYNTHROID) 112 MCG tablet Take 1 tablet (112 mcg total) by mouth daily. 90 tablet 0   losartan (COZAAR) 50 MG tablet Take 1 tablet (50 mg total) by mouth daily. 90 tablet 1   Multiple Vitamins-Minerals (ICAPS AREDS 2) CAPS Take 1 capsule by mouth 2 (two) times daily.     nitroGLYCERIN (NITROSTAT) 0.4 MG SL tablet Place 1 tablet (0.4 mg total) under the tongue every 5 (five) minutes as needed for chest pain. 25 tablet 3   pantoprazole (PROTONIX) 40 MG tablet Take 1 tablet (40 mg total) by mouth daily. 90 tablet 3   polyethylene glycol (MIRALAX / GLYCOLAX) 17 g packet Take 17 g by mouth daily as needed for moderate constipation.     potassium chloride (KLOR-CON) 10 MEQ tablet Take 1 tablet (10 mEq total) by mouth daily. Take 1 tablet (39mq) once per day when taking Torsemide. 30 tablet 5   Semaglutide, 2 MG/DOSE, (OZEMPIC, 2 MG/DOSE,) 8 MG/3ML SOPN Inject 2 mg into the skin once a week. 9 mL 3   torsemide (DEMADEX) 10 MG tablet Take 2 tablets (20 mg total) by mouth daily. Take 1 tablet ('10mg'$ ) daily. 30 tablet 3   vitamin B-12 (CYANOCOBALAMIN) 500 MCG tablet Take 500 mcg by mouth daily.     Current Facility-Administered Medications on File Prior to Visit  Medication Dose Route Frequency Provider Last Rate Last Admin   sodium chloride flush (  NS) 0.9 % injection 3 mL  3 mL Intravenous Q12H [name] D, PA-C        Review of Systems    She has abdominal distention, wt gain, SOB/DOE, LEE with weakness.  She reports palpitations and lightheadedness. She denies falls or syncope. Dawn CP.  Dawn s/sx of bleeding.   All other systems reviewed and are otherwise negative except as noted above.  Physical Exam    VS:  BP 140/78 (BP Location: Left Arm, Patient Position: Sitting, Cuff Size: Large)   Pulse (!) 58   Ht '5\' 2"'$  (1.575 m)   Wt 180 lb (81.6 kg)   SpO2 98%   BMI 32.92 kg/m   , BMI Body mass index is 32.92 kg/m. GEN: Well nourished, well developed, in Dawn acute distress. HEENT: normal. Neck: Supple, Dawn JVD, carotid bruits, or masses. Cardiac: bradycardic but regular, 1/6 systolic murmur. Dawn rubs, or gallops. Dawn clubbing, cyanosis, nonpitting bilateral edema. Radials/DP/PT 2+ and equal bilaterally.  R radial arteriotomy site without hematoma, edema, or signs of infection.  Respiratory:  Respirations regular and unlabored, clear to auscultation bilaterally. GI: slightly firm and distended, BS + x 4. MS: Dawn deformity or atrophy. Skin: warm and dry, Dawn rash. Neuro:  Strength and sensation are intact. Psych: Normal affect.  Accessory Clinical Findings    ECG personally reviewed by me today - 58 bpm, AV dual paced with QTc  555m- Dawn acute changes.  VITALS Reviewed today   Temp Readings from Last 3 Encounters:  09/18/20 97.7 F (36.5 C) (Oral)  06/22/20 97.7 F (36.5 C) (Oral)  03/20/19 (!) 97.5 F (36.4 C)   BP Readings from Last 3 Encounters:  10/06/20 140/78  09/29/20 130/80  09/18/20 136/61   Pulse Readings from Last 3 Encounters:  10/06/20 (!) 58  09/29/20 (!) 55  09/18/20 (!) 56    Wt Readings from Last 3 Encounters:  10/06/20 180 lb (81.6 kg)  09/29/20 177 lb 12.8 oz (80.6 kg)  09/18/20 176 lb (79.8 kg)     LABS  reviewed today   [facility] Results  Component Value Date   WBC 5.5 08/31/2020   HGB 13.6 08/31/2020   HCT 40.7 08/31/2020   MCV 87 08/31/2020   PLT 211 08/31/2020   [facility] Results  Component Value Date   CREATININE 1.36 (H) 09/29/2020   BUN 20 09/29/2020   NA 143 09/29/2020   K 4.0 09/29/2020   CL 105 09/29/2020   CO2 20 09/29/2020   [facility]arcia Results  Component Value Date   ALT 13 08/20/2020   AST 26 08/20/2020   ALKPHOS 129 (H) 08/20/2020   BILITOT 1.1 08/20/2020   [facility] Results  Component Value Date   CHOL 219 (H) 08/20/2020   HDL 41 08/20/2020   LDLCALC 124 (H) 08/20/2020   LDLDIRECT 63 09/17/2019   TRIG 269 (H)  08/20/2020   CHOLHDL 5.3 08/20/2020    [facility]arcia Results  Component Value Date   HGBA1C 6.5 (A) 07/30/2020   [facility] Results  Component Value Date   TSH 0.88 03/31/2020     STUDIES/PROCEDURES reviewed today   MPI 08/26/2020 Pharmacological myocardial perfusion imaging study with Dawn significant  Ischemia Small region of fixed defect in the mid to distal anteroseptal and apical region consistent with prior MI. GI uptake artifact. Hypokinesis of the mid to distal anteroseptal wall,  EF estimated at 57% Dawn EKG changes concerning for ischemia at peak stress or in recovery. Low risk scan  Echo 08/04/20  1. Left ventricular ejection fraction, by estimation, is 50 to 55%. The  left ventricle has low normal function. Left ventricular endocardial  border not optimally defined to evaluate regional wall motion. There is  mild left ventricular hypertrophy. Left  ventricular diastolic parameters are consistent with Grade II diastolic  dysfunction (pseudonormalization). Elevated left atrial pressure.   2. Right ventricular systolic function is normal. The right ventricular  size is normal. Tricuspid regurgitation signal is inadequate for assessing  PA pressure.   3. Left atrial size was mildly dilated.   4. The mitral valve is degenerative. Mild to moderate mitral valve  regurgitation. Dawn evidence of mitral stenosis.   5. The aortic valve is tricuspid. There is moderate calcification of the  aortic valve. There is moderate thickening of the aortic valve. Aortic  valve regurgitation is trivial.   6. Aortic dilatation noted. There is mild dilatation of the ascending  aorta, measuring 37 mm.   7. Mildly dilated pulmonary artery.   [facility] 05/03/2017 Relatively normal right heart cath pressures with only mildly elevated LVEDP/PCP. Prox LAD to Mid LAD lesion is 45% stenosed. Mid LAD lesion is 40% stenosed. Neither lesion was flow-limiting Lat 2nd Diag lesion is 70% stenosed. Vessel is way too small for  PCI Previously placed Ost 1st Mrg stent (unknown type) is widely patent. Angiographically moderate disease in the mid and distal LAD with patent stent to the circumflex.  Otherwise Dawn significant disease. Mildly elevated LVEDP and PCWP with essentially normal pulmonary pressures. Adequately diuresed Suspect that patient's decompensation was related to A. fib with RVR and likely diastolic heart failure. Plan: Patient Dawn return to nursing unit for ongoing care with TR band removal. Gentle hydration post cath Defer further plans to [facility] / EP service.  Echo 04/2017 Procedure narrative: Transthoracic echocardiography. Technically    difficult study with reduced echo windows.  - Left ventricle: The cavity size was normal. Wall thickness was    increased in a pattern of mild LVH. Systolic function was normal.    The estimated ejection fraction was in the range of 50% to 55%.    Doppler parameters are consistent with abnormal left ventricular    relaxation (grade 1 diastolic dysfunction). The E/e&' ratio is    between 8-15, suggesting indeterminate LV filling pressure.  - Aortic valve: Trileaflet. Sclerosis without stenosis. There was    trivial regurgitation.  - Mitral valve: Mildly thickened leaflets . There was trivial    regurgitation.  - Left atrium: The atrium was normal in size.  - Inferior vena cava: The vessel was normal in size. The    respirophasic diameter changes were in the normal range (>= 50%),    consistent with normal central venous pressure.   Assessment & Plan    Acute on chronic diastolic heart failure, PHTN, mild to moderate mitral regurgitation --Recent R/LHC for ongoing CP.SOB as above with pulmonary HTN. Echo EF 50 to 55%, mild LVH, G2 DD, mild LAE, mild to moderate MR, moderate thickening of the aortic valve/calcification with trivial TR, mildly dilated pulmonary artery, ascending aorta 37 mm.  Diuresis has been complicated by renal function. Recent  cath with upper nl left hear filling pressures, and mildly elevated PA and RH pressures. CO/CI mildly reduced. At end of cath, torsemide increased to '20mg'$  daily. Dawn repeat a BMET today. She appears volume up today and plan to further increase to torsemide '30mg'$  daily at this time with KCL tab 95mq. Likely diastolic heart failure complicated by mitral  regurgitation, episodes of atrial fibrillation, pulmonary hypertension, and sleep apnea as outlined above.  Continue ARB and amlodipine 2.'5mg'$  daily.  Coronary artery dz s/p PCI (2019) -- Recent LHC as above for ongoing SOB and CP. Reports ongoing sx with some improvement with diuresis. Severe single vessel CAD noted but similar to prior 2019 cath. 70%s of D2 branch with mild to moderate nonobstructive LSD dz. Patent Lcx/OM1 stent with 30-40% pISR, new from 2019.  Recommendation was for secondary prevention of CAD and medical therapy of D2 branch, which is too small for PCI. Dawn ASA given chronic OAC. Not on a BB. Continue amlodipine and suspect Dawn discontinue PRN Cardizem in near future and given EF 50-55%.  She is tolerating the sublingual nitro for CP despite history of headaches with Imdur.  Recommend ongoing aggressive risk factor modification.  Continue Repatha.  Ongoing dietary and lifestyle changes.  Atrial fibrillation s/p DCCV 06/22/20 -- Reports recent tachypalpitations.  Prior PVI and redo 7/19. She does report intermittent lightheadedness but denies any syncope.  She remains in sinus rhythm today s/p DCCV 06/22/20 with device report of Afib after cardioversion. Per EP notes, p wave oversensing with false detection of Afib. Per Afib clinic notes, her afib burden at baseline is ~8%, and if more persistent episodes, dofetilide admission could be considered in the future.  As previously noted, A. fib with is likely exacerbating her volume status.  Eventual transition off of PRN Cardizem for rates over 100bpm. Continue Eliquis '5mg'$  BID for anticoagulation  with CHA2DS2VASc score of at least  7. Dawn s/sx of bleeding.  Essential Hypertension, goal BP less than 130/80 -- BP improving with each visit, though markedly elevated during cath.  SBP over 250mHg during cath with diuresis increased and amlodipine 2.'5mg'$  started. Continue to watch LEE on amlodipine. She is tolerating her ARB. Increased diuresis as above. Avoiding hydralazine, given reflex tachycardia with history of A. Fib. Avoiding clonidine, given risk of rebound hypertension.  Could consider increased dose amlodipine for further BP support if it does not increase LEE. Avoiding Imdur given report of HA in the past - could retrial but Dawn defer for now. Continue to monitor BP at home. Salt and fluid restrictions reviewed.  Ongoing treatment of sleep apnea recommended.  Orthostatic hypotension --Reports return of dizziness after increased diuresis. Slow position changes. Continue to monitor.   Hyperlipidemia, Hypertriglyceridemia Statin intolerance --Most recent LDL not at goal and elevated Tg noted.  She has started Repatha since her previous clinic visit.  We Dawn continue fenofibrate for now and pending repeat labs.  SSS s/p PPM / [employer] --S/p PPM. Continue to follow with EP as directed.  OSA --Continue CPAP. Referral to Dr. GPatsey Bertholdof pulmonology.  DM2 --Defer to endocrinology.  Consider addition of SGLT2 inhibitor at future clinic visits.  CKD --Dawn check BMET today.  Long history of renal insufficiency limiting diuresis as outlined above.  Caution with nephrotoxins.  Mild aortic dilation -- Recommend ongoing control of heart rate, BP, and cholesterol.  BP suboptimal with plan to control as above.  Started on Repatha.  Continue Eliquis in lieu of ASA.  Avoid fluoroquinolones and heavy lifting.  Recommend annual echo to monitor.    --------------------------------- --------------------------  Medication changes:  -Increased to torsemide '30mg'$  qd and KCL tab 150m daily Labs  ordered:  --BMET, BMET at RTC  -Studies / Imaging ordered:   --None -referred to pulmonology / Dr. GoPatsey Bertholduture considerations:  --BP control optimization --Repeat lipid and liver function in  6-8w / continue fenofibrate? -Disposition:  --RTC at next available slot given scheduling limitations  *Please be aware that the above documentation was completed voice recognition software and may contain dictation errors.      Arvil Chaco, PA-C 10/06/2020

## 2020-10-06 NOTE — Patient Instructions (Signed)
Medication Instructions:   Your physician has recommended you make the following change in your medication:    INCREASE your Torsemide to 30 MG once a day.  2.    INCREASE your Potassium to 15 MEQ once a day.  *If you need a refill on your cardiac medications before your next appointment, please call your pharmacy*   Lab Work:   Please go to the [facility] on your way out today for a lab draw.   2.    Your physician recommends that you return for lab work in: IN ONE WEEK    Testing/Procedures: None ordered   Follow-Up: At [facility], you and your health needs are our priority.  As part of our continuing mission to provide you with exceptional heart care, we have created designated Provider Care Teams.  These Care Teams include your primary Cardiologist (physician) and Advanced Practice Providers (APPs -  Physician Assistants and Nurse Practitioners) who all work together to provide you with the care you need, when you need it.  We recommend signing up for the patient portal called "MyChart".  Sign up information is provided on this After Visit Summary.  MyChart is used to connect with patients for Virtual Visits (Telemedicine).  Patients are able to view lab/test results, encounter notes, upcoming appointments, etc.  Non-urgent messages can be sent to your provider as well.   To learn more about what you can do with MyChart, go to [email or web address].    Your next appointment:   6 week(s)  The format for your next appointment:   In Person  Provider:   You may see Dr. Rockey Situ or one of the following Advanced Practice Providers on your designated Care Team:   Murray Hodgkins, NP Christell Faith, PA-C [name], PA-C Cadence Kathlen Mody, [place]   Other Instructions

## 2020-10-09 ENCOUNTER — Telehealth: Payer: Self-pay | Admitting: Internal Medicine

## 2020-10-09 NOTE — Telephone Encounter (Signed)
Patient calling  Would like to clarify if we referred her to the CHF clinic  Says she received a letter about it but the clinic does not have any information  Please call to discuss

## 2020-10-09 NOTE — Telephone Encounter (Signed)
Spoke to pt. She received a letter on 09/29/20 and I clarified with pt that that letter was from our office regarding scheduling follow up appointment. And pt was seen by [name], PA on 10/06/20 shortly after.  Confirmed with pt that she has not been referred to the CHF clinic.  Pt appreciative of clarification and has no further questions or needs at this time.

## 2020-10-09 NOTE — Progress Notes (Signed)
Remote pacemaker transmission.   

## 2020-10-13 ENCOUNTER — Institutional Professional Consult (permissible substitution): Payer: Medicare Other | Admitting: Internal Medicine

## 2020-10-15 ENCOUNTER — Other Ambulatory Visit
Admission: RE | Admit: 2020-10-15 | Discharge: 2020-10-15 | Disposition: A | Discharge status: Home / Self Care | Payer: Medicare Other | Attending: Physician Assistant | Admitting: Physician Assistant

## 2020-10-15 DIAGNOSIS — E876 Hypokalemia: Secondary | ICD-10-CM | POA: Insufficient documentation

## 2020-10-15 LAB — BASIC METABOLIC PANEL
Anion gap: 10 (ref 5–15)
BUN: 33 mg/dL — ABNORMAL HIGH (ref 8–23)
CO2: 26 mmol/L (ref 22–32)
Calcium: 9.5 mg/dL (ref 8.9–10.3)
Chloride: 104 mmol/L (ref 98–111)
Creatinine, Ser: 1.82 mg/dL — ABNORMAL HIGH (ref 0.44–1.00)
GFR, Estimated: 28 mL/min — ABNORMAL LOW (ref >60)
Glucose, Bld: 126 mg/dL — ABNORMAL HIGH (ref 70–99)
Potassium: 4.2 mmol/L (ref 3.5–5.1)
Sodium: 140 mmol/L (ref 135–145)

## 2020-10-16 ENCOUNTER — Telehealth: Payer: Self-pay | Admitting: *Deleted

## 2020-10-16 DIAGNOSIS — I5033 Acute on chronic diastolic (congestive) heart failure: Secondary | ICD-10-CM

## 2020-10-16 DIAGNOSIS — N183 Chronic kidney disease, stage 3 unspecified: Secondary | ICD-10-CM

## 2020-10-16 DIAGNOSIS — I480 Paroxysmal atrial fibrillation: Secondary | ICD-10-CM

## 2020-10-16 MED ORDER — LOSARTAN POTASSIUM 50 MG PO TABS: 25.0000 mg | ORAL_TABLET | Freq: Every day | ORAL | 1 refills | Status: DC

## 2020-10-16 NOTE — Telephone Encounter (Signed)
Patient is returning your call.  

## 2020-10-16 NOTE — Telephone Encounter (Signed)
Left voicemail message that I am calling her with recommendations based on her recent lab results with request that she please call back.

## 2020-10-16 NOTE — Telephone Encounter (Signed)
Spoke with patient and reviewed results and recommendations. She states that she took a total dose of 60 mg torsemide within 12 hours. Instructed her to hold torsemide and potassium for 2 days then have repeat labs done on Monday or Tuesday [facility]. Also advised to decrease losartan to 25 mg once daily and she has enough at home for this now. She verbalized understanding of all instructions, agreeable with this plan, and had no further questions at this time.   Reviewed this information with [name] PA-C here in the office and will wait for upcoming repeat labs.

## 2020-10-16 NOTE — Telephone Encounter (Signed)
-----   Message from Valora Corporal, RN sent at 10/16/2020 10:40 AM EDT -----  ----- Message ----- From: Arvil Chaco, PA-C Sent: 10/16/2020  10:38 AM EDT To: [facility] Triage  Bump in renal function. She sent a message stating she accidentally took too much torsemide. Is she able to come back and repeat her blood work [facility]) today or Monday?  Until then, please have her reduce to losartan '25mg'$  daily and hold her torsemide and potassium for the next 2 days.

## 2020-10-16 NOTE — Telephone Encounter (Signed)
Left voicemail message to call back for review of results and recommendations.  

## 2020-10-19 ENCOUNTER — Other Ambulatory Visit: Payer: Self-pay | Admitting: Physician Assistant

## 2020-10-19 ENCOUNTER — Other Ambulatory Visit: Payer: Self-pay | Admitting: Internal Medicine

## 2020-10-19 ENCOUNTER — Other Ambulatory Visit
Admission: RE | Admit: 2020-10-19 | Discharge: 2020-10-19 | Disposition: A | Discharge status: Home / Self Care | Payer: Medicare Other | Source: Ambulatory Visit | Attending: Physician Assistant | Admitting: Physician Assistant

## 2020-10-19 DIAGNOSIS — N183 Chronic kidney disease, stage 3 unspecified: Secondary | ICD-10-CM | POA: Insufficient documentation

## 2020-10-19 DIAGNOSIS — Z1231 Encounter for screening mammogram for malignant neoplasm of breast: Secondary | ICD-10-CM

## 2020-10-19 DIAGNOSIS — I5033 Acute on chronic diastolic (congestive) heart failure: Secondary | ICD-10-CM | POA: Insufficient documentation

## 2020-10-19 DIAGNOSIS — I480 Paroxysmal atrial fibrillation: Secondary | ICD-10-CM | POA: Insufficient documentation

## 2020-10-19 LAB — BASIC METABOLIC PANEL
Anion gap: 10 (ref 5–15)
BUN: 23 mg/dL (ref 8–23)
CO2: 23 mmol/L (ref 22–32)
Calcium: 9.2 mg/dL (ref 8.9–10.3)
Chloride: 106 mmol/L (ref 98–111)
Creatinine, Ser: 1.3 mg/dL — ABNORMAL HIGH (ref 0.44–1.00)
GFR, Estimated: 42 mL/min — ABNORMAL LOW (ref >60)
Glucose, Bld: 115 mg/dL — ABNORMAL HIGH (ref 70–99)
Potassium: 4 mmol/L (ref 3.5–5.1)
Sodium: 139 mmol/L (ref 135–145)

## 2020-10-20 ENCOUNTER — Telehealth: Payer: Self-pay | Admitting: *Deleted

## 2020-10-20 DIAGNOSIS — I5033 Acute on chronic diastolic (congestive) heart failure: Secondary | ICD-10-CM

## 2020-10-20 DIAGNOSIS — N183 Chronic kidney disease, stage 3 unspecified: Secondary | ICD-10-CM

## 2020-10-20 MED ORDER — LOSARTAN POTASSIUM 50 MG PO TABS: 50.0000 mg | ORAL_TABLET | Freq: Every day | ORAL | 1 refills | Status: DC

## 2020-10-20 NOTE — Telephone Encounter (Signed)
Left voicemail message to call back for review of results and recommendations.  

## 2020-10-20 NOTE — Telephone Encounter (Signed)
-----   Message from Arvil Chaco, PA-C sent at 10/19/2020  3:52 PM EDT ----- Kermit Balo news!   Renal function improved from 4 days ago and back at baseline.  She can increase back to losartan 50 mg daily. She has likely already restarted her torsemide and potassium (we only had her hold this for 2 days).she can continue this torsemide/potassium, given her improved renal function.

## 2020-10-20 NOTE — Telephone Encounter (Signed)
Reviewed results and recommendations with patient and instructions for medications. She did inquire about repeat labs but this was not listed in the result note. Patient stated she saw this request in a My Chart message. Looked through chart and found request. Order entered for her to have done at [facility]. She was appreciative for the call with no further questions at this time.     Good news! Kidney function improved over the weekend. You can restart your torsemide and potassium (if not already restarted).  You can increase back to losartan '50mg'$  daily.    We will plan to recheck your kidney function again in 1 week to make sure that your kidney function is stable after this restart of your medications and giventhe changes at our last visit.  As always, please let me know if any questions!  Written by Arvil Chaco, PA-C on 10/19/2020  5:11 PM EDT

## 2020-10-22 ENCOUNTER — Telehealth: Payer: Self-pay | Admitting: Physician Assistant

## 2020-10-22 NOTE — Telephone Encounter (Addendum)
Pharmacy calling for verbal to replace damaged rx for repatha

## 2020-10-22 NOTE — Telephone Encounter (Signed)
Form for replacement device received, signed, and faxed back to [facility] as requested on form.

## 2020-10-22 NOTE — Telephone Encounter (Signed)
Per patient [employer] is going to replace faulty syringes but need a verbal order for a new rx.   Please call [phone]

## 2020-10-25 IMAGING — MG DIGITAL SCREENING BILATERAL MAMMOGRAM WITH TOMO AND CAD
8 series · 8 of 24 positions shown · non-contrast
Comparison: Previous exam(s).

CLINICAL DATA: Screening.

EXAM:
DIGITAL SCREENING BILATERAL MAMMOGRAM WITH TOMO AND CAD

[R MLO synth-2D]
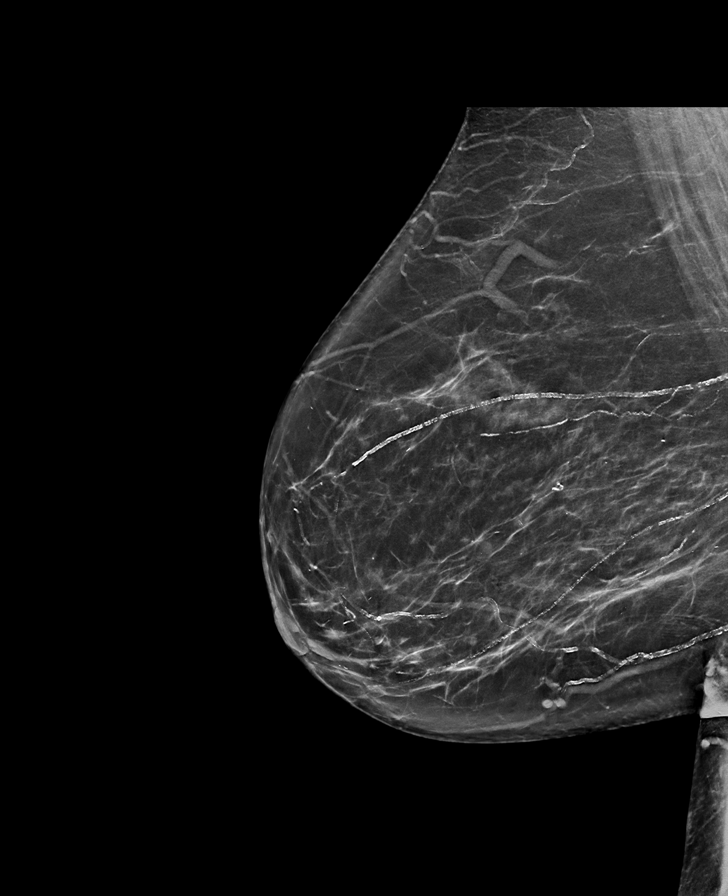

[L CC synth-2D]
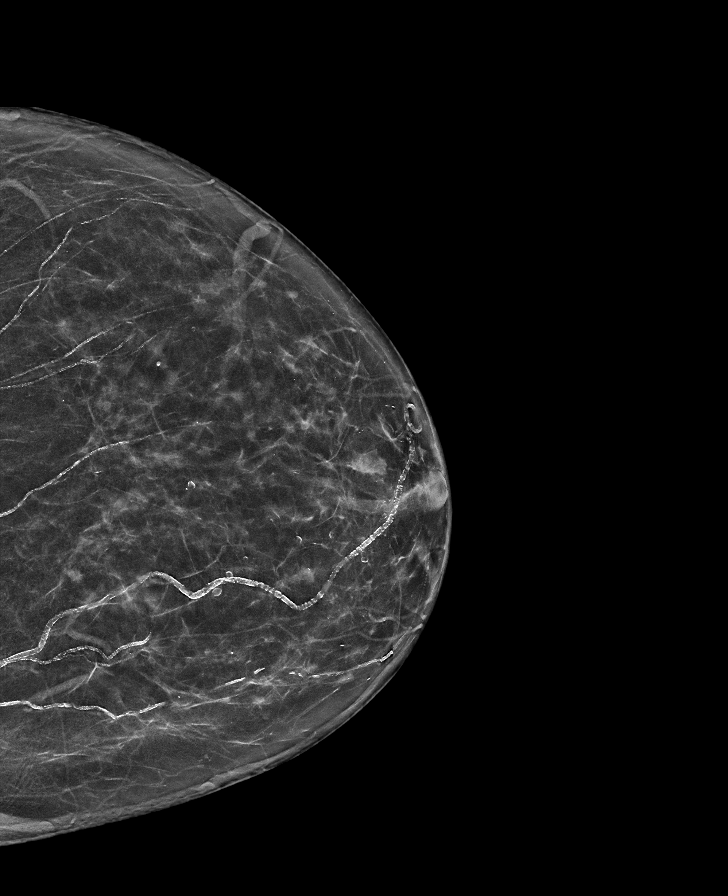

[R CC synth-2D]
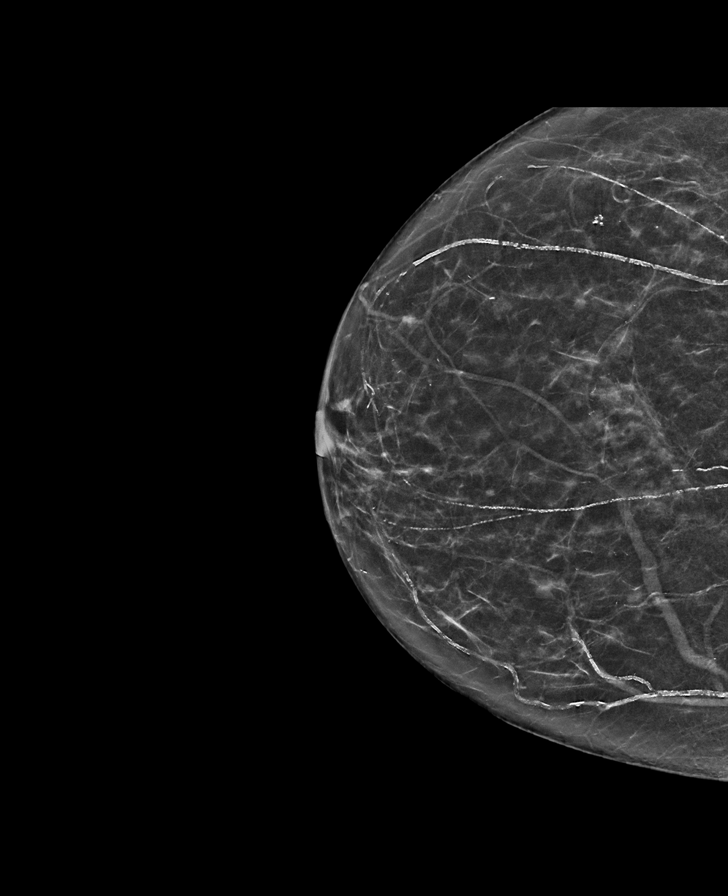

[L MLO synth-2D]
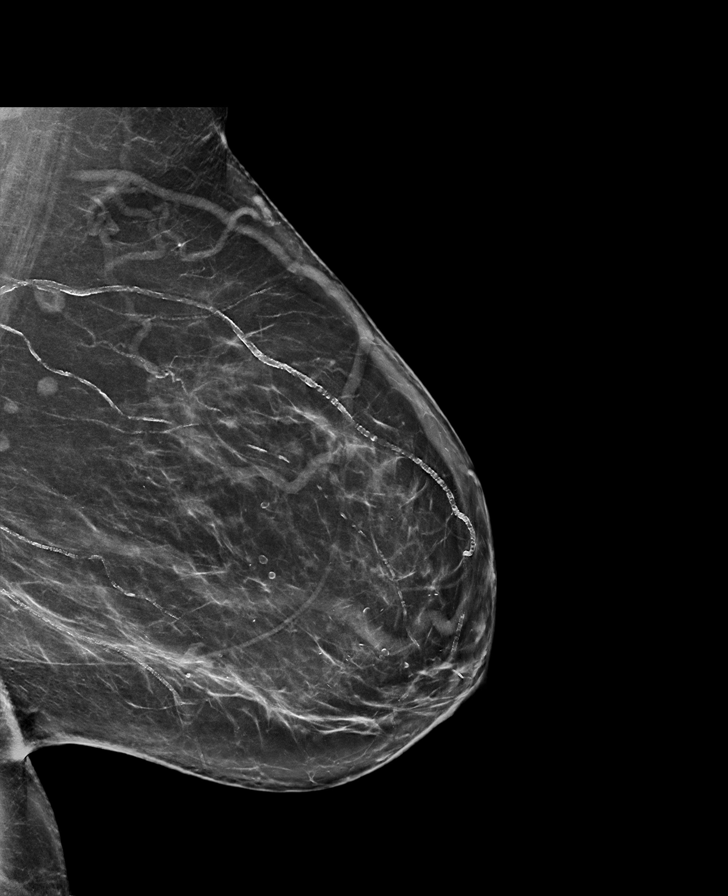

[R CC tomo · tomo slice 31/61.0]
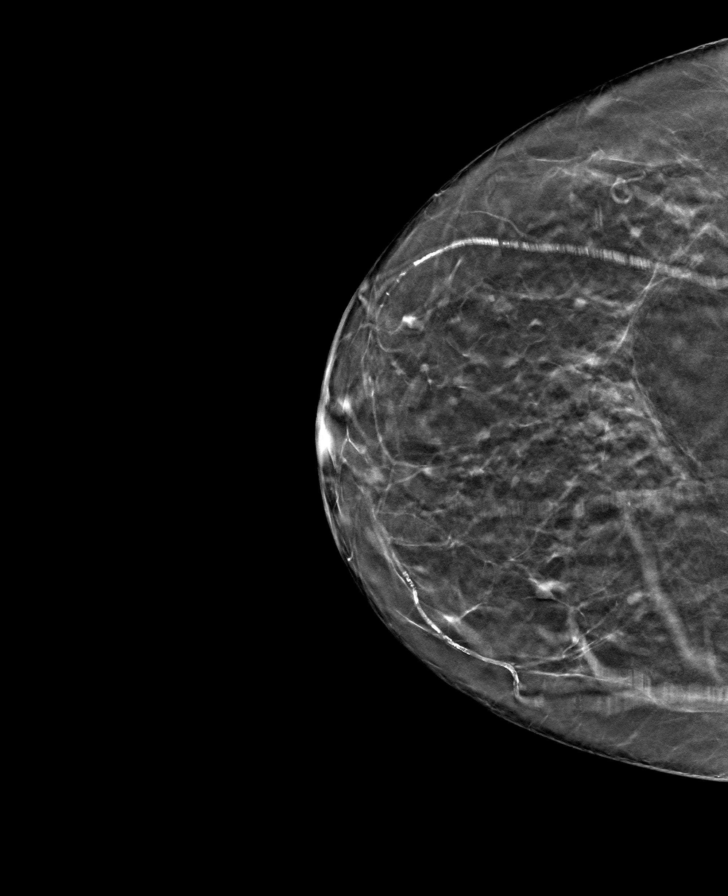

[L CC tomo · tomo slice 35/69.0]
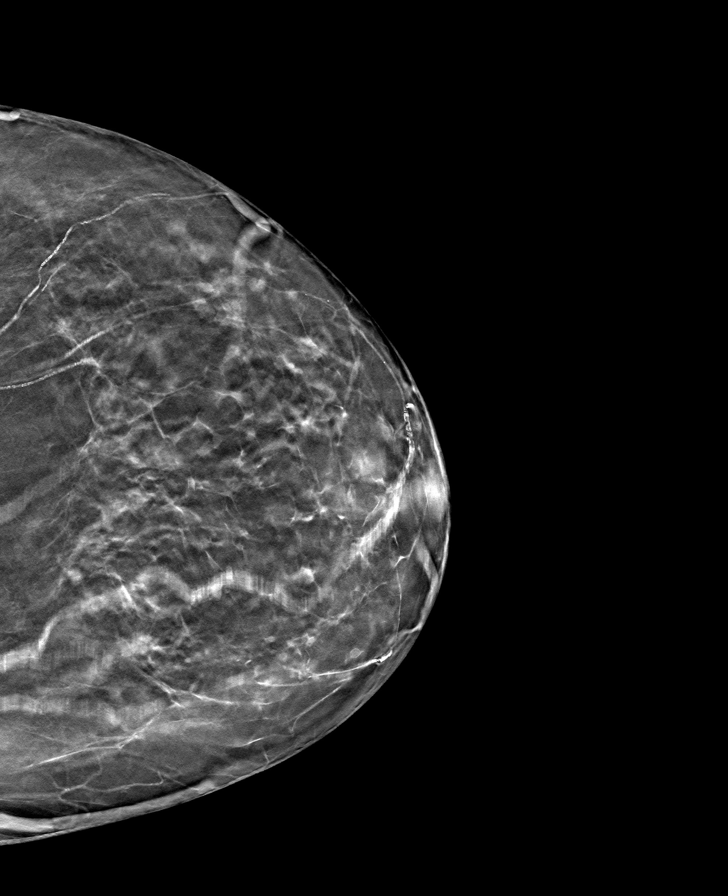

[R MLO tomo · tomo slice 39/76.0]
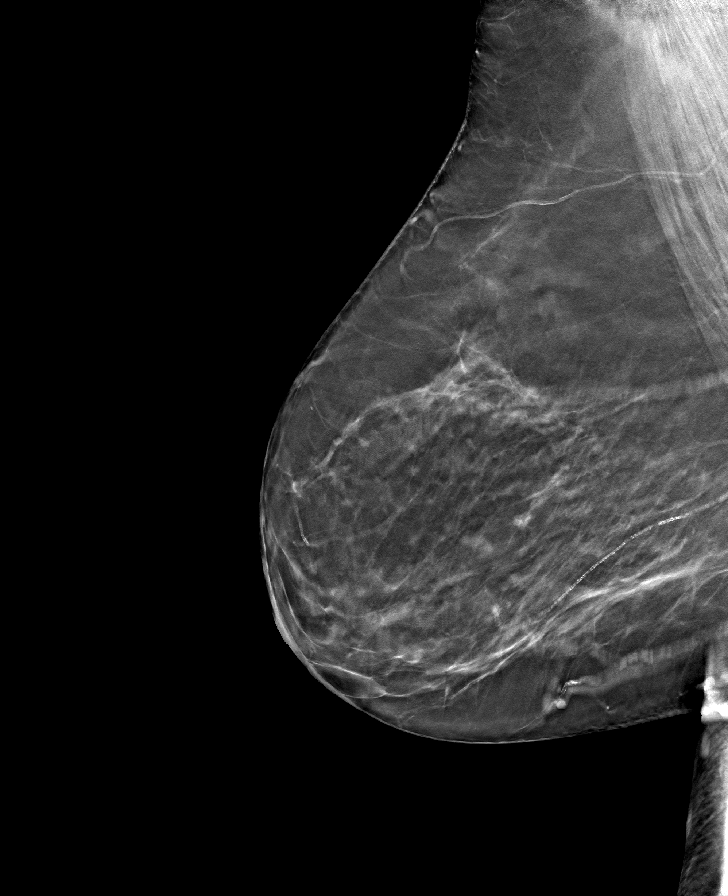

[L MLO tomo · tomo slice 42/83.0]
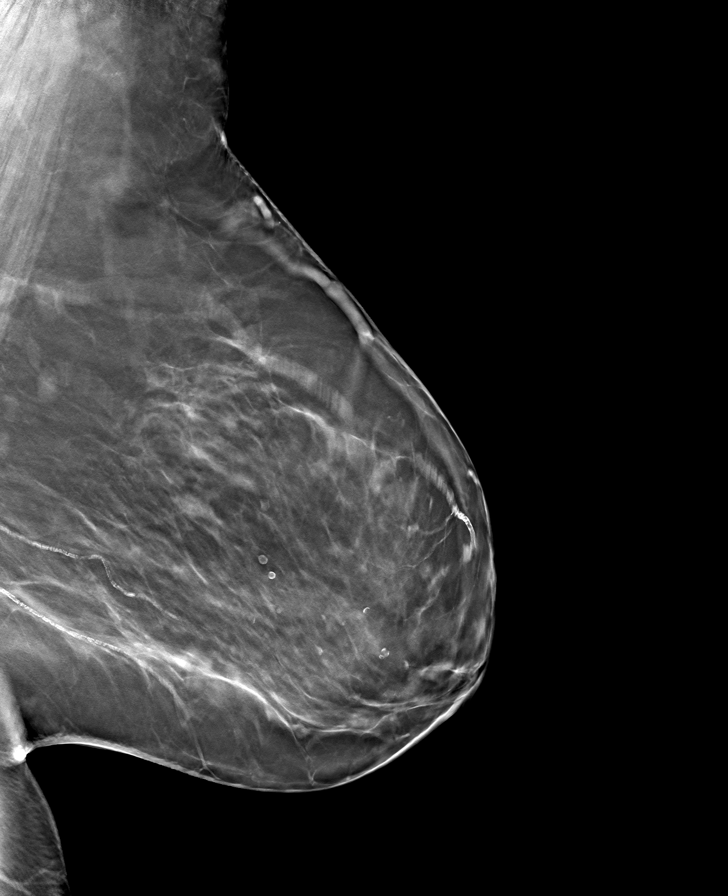

[8 of 24 positions shown; findings below may reference images not displayed]

ACR Breast Density Category b: There are scattered areas of
fibroglandular density.
FINDINGS: There are no findings suspicious for malignancy. Images were
processed with CAD.
IMPRESSION: No mammographic evidence of malignancy. A result letter of this
screening mammogram will be mailed directly to the patient.

RECOMMENDATION:
Screening mammogram in one year. (Code:[id number])

BI-RADS CATEGORY  1: Negative.

## 2020-10-27 ENCOUNTER — Other Ambulatory Visit
Admission: RE | Admit: 2020-10-27 | Discharge: 2020-10-27 | Disposition: A | Discharge status: Home / Self Care | Payer: Medicare Other | Source: Ambulatory Visit | Attending: Physician Assistant | Admitting: Physician Assistant

## 2020-10-27 DIAGNOSIS — N183 Chronic kidney disease, stage 3 unspecified: Secondary | ICD-10-CM | POA: Insufficient documentation

## 2020-10-27 DIAGNOSIS — I5033 Acute on chronic diastolic (congestive) heart failure: Secondary | ICD-10-CM | POA: Diagnosis present

## 2020-10-27 LAB — BASIC METABOLIC PANEL
Anion gap: 9 (ref 5–15)
BUN: 21 mg/dL (ref 8–23)
CO2: 26 mmol/L (ref 22–32)
Calcium: 9.2 mg/dL (ref 8.9–10.3)
Chloride: 105 mmol/L (ref 98–111)
Creatinine, Ser: 1.22 mg/dL — ABNORMAL HIGH (ref 0.44–1.00)
GFR, Estimated: 45 mL/min — ABNORMAL LOW (ref >60)
Glucose, Bld: 132 mg/dL — ABNORMAL HIGH (ref 70–99)
Potassium: 4 mmol/L (ref 3.5–5.1)
Sodium: 140 mmol/L (ref 135–145)

## 2020-10-30 ENCOUNTER — Ambulatory Visit
Admission: RE | Admit: 2020-10-30 | Discharge: 2020-10-30 | Disposition: A | Discharge status: Home / Self Care | Payer: Medicare Other | Source: Ambulatory Visit | Attending: Internal Medicine | Admitting: Internal Medicine

## 2020-10-30 ENCOUNTER — Other Ambulatory Visit: Payer: Self-pay

## 2020-10-30 DIAGNOSIS — Z1231 Encounter for screening mammogram for malignant neoplasm of breast: Secondary | ICD-10-CM | POA: Insufficient documentation

## 2020-11-03 ENCOUNTER — Other Ambulatory Visit
Admission: RE | Admit: 2020-11-03 | Discharge: 2020-11-03 | Disposition: A | Discharge status: Home / Self Care | Payer: Medicare Other | Attending: Physician Assistant | Admitting: Physician Assistant

## 2020-11-03 DIAGNOSIS — I5033 Acute on chronic diastolic (congestive) heart failure: Secondary | ICD-10-CM | POA: Diagnosis present

## 2020-11-03 LAB — BASIC METABOLIC PANEL
Anion gap: 14 (ref 5–15)
BUN: 25 mg/dL — ABNORMAL HIGH (ref 8–23)
CO2: 23 mmol/L (ref 22–32)
Calcium: 9 mg/dL (ref 8.9–10.3)
Chloride: 103 mmol/L (ref 98–111)
Creatinine, Ser: 1.44 mg/dL — ABNORMAL HIGH (ref 0.44–1.00)
GFR, Estimated: 37 mL/min — ABNORMAL LOW (ref >60)
Glucose, Bld: 128 mg/dL — ABNORMAL HIGH (ref 70–99)
Potassium: 3.8 mmol/L (ref 3.5–5.1)
Sodium: 140 mmol/L (ref 135–145)

## 2020-11-09 ENCOUNTER — Other Ambulatory Visit: Payer: Self-pay | Admitting: Internal Medicine

## 2020-11-09 ENCOUNTER — Other Ambulatory Visit: Payer: Self-pay | Admitting: Pulmonary Disease

## 2020-11-09 DIAGNOSIS — R928 Other abnormal and inconclusive findings on diagnostic imaging of breast: Secondary | ICD-10-CM

## 2020-11-10 ENCOUNTER — Encounter: Payer: Self-pay | Admitting: Internal Medicine

## 2020-11-11 ENCOUNTER — Ambulatory Visit
Admission: RE | Admit: 2020-11-11 | Discharge: 2020-11-11 | Disposition: A | Discharge status: Home / Self Care | Payer: Medicare Other | Source: Ambulatory Visit | Attending: Internal Medicine | Admitting: Internal Medicine

## 2020-11-11 ENCOUNTER — Other Ambulatory Visit: Payer: Self-pay

## 2020-11-11 DIAGNOSIS — R928 Other abnormal and inconclusive findings on diagnostic imaging of breast: Secondary | ICD-10-CM

## 2020-11-12 NOTE — Telephone Encounter (Signed)
Pt came by the office 11/12/2020 at 3:04pm to pick up sample

## 2020-11-20 ENCOUNTER — Other Ambulatory Visit: Payer: Self-pay

## 2020-11-20 ENCOUNTER — Ambulatory Visit: Payer: Medicare Other | Admitting: Physician Assistant

## 2020-11-20 ENCOUNTER — Encounter: Payer: Self-pay | Admitting: Physician Assistant

## 2020-11-20 VITALS — BP 140/70 | HR 58 | Ht 62.0 in | Wt 180.0 lb

## 2020-11-20 DIAGNOSIS — I480 Paroxysmal atrial fibrillation: Secondary | ICD-10-CM

## 2020-11-20 DIAGNOSIS — E785 Hyperlipidemia, unspecified: Secondary | ICD-10-CM

## 2020-11-20 DIAGNOSIS — N183 Chronic kidney disease, stage 3 unspecified: Secondary | ICD-10-CM | POA: Diagnosis not present

## 2020-11-20 DIAGNOSIS — Z79899 Other long term (current) drug therapy: Secondary | ICD-10-CM

## 2020-11-20 DIAGNOSIS — I5032 Chronic diastolic (congestive) heart failure: Secondary | ICD-10-CM | POA: Diagnosis not present

## 2020-11-20 DIAGNOSIS — I272 Pulmonary hypertension, unspecified: Secondary | ICD-10-CM

## 2020-11-20 DIAGNOSIS — Z789 Other specified health status: Secondary | ICD-10-CM

## 2020-11-20 DIAGNOSIS — I25110 Atherosclerotic heart disease of native coronary artery with unstable angina pectoris: Secondary | ICD-10-CM

## 2020-11-20 DIAGNOSIS — G4733 Obstructive sleep apnea (adult) (pediatric): Secondary | ICD-10-CM

## 2020-11-20 DIAGNOSIS — Z950 Presence of cardiac pacemaker: Secondary | ICD-10-CM

## 2020-11-20 NOTE — Progress Notes (Signed)
Office Visit    Patient Name: Dawn Garcia [facility] Date of Encounter: 11/20/2020  PCP:  Perrin Maltese, MD   [place]  Cardiologist: Dr. Saunders Revel Advanced Practice Provider:  No care team member to display Electrophysiologist: Dr. Caryl Comes  Chief Complaint    Chief Complaint  Patient presents with   Other    6 wk f/u no complaints today. Meds reviewed verbally with pt.   80 y.o. female with history of CAD with remote stent and patent stent by catheterization in 2014, paroxysmal atrial fibrillation s/p reported ablation in 2013 and repeat atrial fibrillation ablation [id number], chronic diastolic CHF, COPD, CKD, DM2, orthostatic hypotension, hyperlipidemia, hypothyroidism, reported history of bradycardia / 5-second pauses on cardiac monitoring with dual-chamber 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 for sick sinus syndrome/tachybradycardia syndrome and RA lead revision due to dislodgment, OSA, and here today for 6 week follow-up.  Past Medical History    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[place])    Arthritis    "all over; particularly in hands/fingers; all my joints" (10/05/2017)   Atrial fibrillation (HCC)    CAD (coronary artery disease)    CHF (congestive heart failure) ([place])     A-Fib   Chronic kidney disease    COPD (chronic obstructive pulmonary disease) ([place])    "very mild" (10/05/2017)   Family history of adverse reaction to anesthesia    "daughter w/PONV & woke up during endoscopy" (10/05/2017)   GERD (gastroesophageal reflux disease)    Gout    Graves' disease    S/P "radioactive tx"   Heart disease    History of blood transfusion    "related to shoulder replacement"    History of gout    "no longer on daily RX" (10/05/2017)   History of hiatal hernia    Hyperlipidemia    Hypertension    Hypocalcemia    Hypokalemia    Hypothyroidism    Macular degeneration     Melanoma ([place]) ~ 2012   "off my back" (10/05/2017)   Myocardial infarction [facility])    "was told I'd had one; don't really know when" (10/05/2017)   Orthostatic hypotension 2020   OSA on CPAP    Pacemaker    Presence of permanent cardiac 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   Renal disorder    Scarring of lung 04/2017   "found by radiology" (10/05/2017)   Sinus node dysfunction ([place])    Type II diabetes mellitus ([place])    Vitamin B12 deficiency    Vitamin D deficiency    Past Surgical History:  Procedure Laterality Date   APPENDECTOMY     ATRIAL FIBRILLATION ABLATION N/A 10/05/2017   Procedure: ATRIAL FIBRILLATION ABLATION;  Surgeon: Constance Haw, MD;  Location: [place] [facility];  Service: Cardiovascular;  Laterality: N/A;   ATRIAL FIBRILLATION ABLATION  ~ 2014   CARDIAC CATHETERIZATION  04/2017   CARDIOVERSION N/A 06/22/2020   Procedure: CARDIOVERSION;  Surgeon: Nelva Bush, MD;  Location: [facility] ORS;  Service: Cardiovascular;  Laterality: N/A;   COLONOSCOPY W/ BIOPSIES AND POLYPECTOMY  2015   1 polyp removed- repeat 3 years   CORONARY ANGIOPLASTY WITH STENT PLACEMENT  ~ 2014   EP IMPLANTABLE DEVICE N/A 11/10/2015   Procedure: Pacemaker Implant;  Surgeon: Will [name], MD;  Location: [place] [facility];  Service: Cardiovascular;  Laterality: N/A;   EP IMPLANTABLE DEVICE N/A 11/11/2015   Procedure: Lead  Revision/Repair;  Surgeon: Evans Lance, MD;  Location: [place] [facility];  Service: Cardiovascular;  Laterality: N/A;   INGUINAL HERNIA REPAIR Left 1951   JOINT REPLACEMENT     LAPAROSCOPIC CHOLECYSTECTOMY     MELANOMA EXCISION  ~ 2012   "off my back" (10/05/2017)   PARATHYROIDECTOMY     "removed 3 glands; still have 1 gland" (10/05/2017)   RIGHT/LEFT HEART CATH AND CORONARY ANGIOGRAPHY N/A 05/03/2017   Procedure: RIGHT/LEFT HEART CATH AND CORONARY ANGIOGRAPHY;  Surgeon: Leonie Man, MD;  Location: [place] [facility];  Service: Cardiovascular;  Laterality: N/A;   RIGHT/LEFT  HEART CATH AND CORONARY ANGIOGRAPHY N/A 09/18/2020   Procedure: RIGHT/LEFT HEART CATH AND CORONARY ANGIOGRAPHY;  Surgeon: Nelva Bush, MD;  Location: [place] [facility];  Service: Cardiovascular;  Laterality: N/A;   TONSILLECTOMY     TOTAL SHOULDER ARTHROPLASTY Right x 2   VAGINAL HYSTERECTOMY     "total"    Allergies  Allergies  Allergen Reactions   Demerol [Meperidine] Nausea And Vomiting    Tolerated Fentanyl 11/10/15   Epinephrine Shortness Of Breath and Palpitations    Powder   Multaq [Dronedarone] Other (See Comments)    Kidney failure and lung problems   Sulfa Antibiotics Anaphylaxis   Tetracyclines & Related Swelling and Other (See Comments)    Made nose, lips,  And tongue itchy   Quinolones Other (See Comments)    Aortic dilation. Avoid FQ, as these are showing to increase Ao dilation.    Statins     Foggy head, episodic amnesia    Imdur [Isosorbide Nitrate]     HA    History of Present Illness    Dawn Garcia is a 80 y.o. female with PMH as above.  She is followed by the atrial fibrillation clinic in [place], as well as Dr. Caryl Comes.  She has a long history of atrial fibrillation with ablation in [place] in 2013 and repeat ablation by Dr. Curt Bears in 2019.  She was on amiodarone in the past but reported this was discontinued due to development of lung issues.  She did not tolerate Multaq.    She underwent DCCV 06/22/2020.   The device clinic received an alert for increased atrial fibrillation burden 06/26/2020.   When seen 06/30/2020, she was back in sinus rhythm.  Lasix was prescribed for wt gain and abdominal bloating.  She has been maintained on anticoagulation with Eliquis 5 mg twice daily and denies any bleeding issues on anticoagulation.  Seen 07/10/2020 with report of intermittent dieting for DM2 and regular follow-up with endocrinology.  She felt volume up and reported only receipt of a few tablets of Lasix at a previous clinic visit.  She reports  taking 20 mg of Lasix daily then 40 mg of Lasix.  She was bloated and felt as if she is holding onto fluid in her stomach.  She reported abdominal aches that were not necessarily connected with food.  She wondered if her Mestinon was contributing.  She reported weight gain and at times reduced appetite though with consideration of intermittent fasting.  Home weight 1 7171 pounds and most recently 176.7 pounds.  She was using her CPAP.  She reported home SBP 120-140 and DBP 50 to 80s.  Lasix was changed to torsemide.  Between visits, she reported ongoing weight gain and feelings of being volume up.  Several attempts to increase volume output were made with patient report that she was still holding onto volume and not having  very much volume output.  As a result, echo obtained with results as below showing EF 50 to 55%, mild LVH, G2 DD, elevated left atrial pressure, mild LAE, mild to moderate MR with degenerative mitral valve, moderate calcification of the aortic valve with trivial AR, mild dilation of the aorta at 37 mm, mildly dilated pulmonary artery.  Seen 08/13/2020 and reported wt gain, bloating, and cramping and uncertain if mestinon was contributing.  Had has GI symptoms with her abdominal bloating.  She noted chest pain, shortness of breath, dyspnea.  She had headache with elevated blood pressure.  She had throat pain with her chest pain.  She also had diaphoresis.  She reported bilateral lower extremity edema, alleviated with diaphoresis. Home BP 177/98 and repeat BP 183/103.   She reported weight as high as 177 pounds.  In clinic, BP 160/92 with HR 57 bpm and oxygen saturation 98%.  Weight 180 pounds.  She wonders what will happen if she goes down on her Mestinon, as she wonders if this is contributing to her elevated blood pressure.  She had recently learned about a family history of heart disease in her father and is trying to find out more information regarding this history.  He died at the age of 64.   She was uncertain if this was due to AVM versus thrombus.  She reported some personal stressors, including no surrounding her husband who has Alzheimer's. She was not taking her torsemide at time though did note smaller ankles with diuresis.  She was taking as needed MiraLAX for constipation. Recommendation was for MPI.  Subsequent recommendation was also for Repatha and referral to sleep apnea clinic.  MPI showed no significant ischemia.  A small region of fixed defect in the mid to distal anterior septal and apical region was consistent with prior MI.  GI uptake was noted.  Hypokinesis in the mid to distal anterior septal wall was noted.  EF estimated at 57%.  Overall, this was ruled a low risk scan.  Due to ongoing sx, she was scheduled for catheterization with results as below. Cath showed severe 1v CAD with 70%s of small, bifurcating D2 similar to 2019 cath. Mild to moderate nonobstructive CAD in LAD appeared similar. She has patent Lcx and OM1 with 30-40% pISR, new from 2019. LH filling pressures were upper limits of nl. She had mild elevation of pulmonary pressures and RH pressures with mildly reduced CO/CI. Systemic HTN with SBP greater than 215mHg noted during cath. Torsemide increased to '20mg'$  daily, she was started on low dose amlodipine. Medical therapy was recommended for D2 branch, given too small for PCI.  Seen 10/06/2020 and noted ongoing shortness of breath and dyspnea.  She wondered about long-haul her symptoms.  She was not sleeping well, often sleeping 3 to 4 hours per night.  She had tried melatonin without relief.  She had lightheadedness, new palpitations/chest discomfort with palpitations.  She was walking her dog 2-3 times per day.  No recent falls, though she did report sitting down very hard 1 time.  She reported fatigue in her lower extremities when walking.  She would get a quarter of the way around and then her legs would weaken.  She reported pain in her upper thighs and hip  area.  She had abdominal bloating and ongoing lower extremity edema.  She took torsemide 30 mg daily the day before and felt improved symptoms and wondered about increasing her Lasix further.  Subsequent labs showed bump in renal function with  diuresis held and then restarted at torsemide 30 mg daily.  Follow-up labs again showed bump in renal function with diuresis held for 3 days and then restarted at torsemide 30 mg daily.  Today, 11/20/2020, she returns to clinic and notes that she is overall doing well from a cardiac standpoint.  She is joined today by her husband's dog.  She reports that she has noticed peace of mind and overall relief with any symptoms, given that she knows what is going on now after all her work-up.  Most of the time, she does not feel as if increased diuresis really helps her symptoms.  She reports weight gain of 8 - 10 pounds heavier throughout all of her cardiac visits.  At one point, she reports weight down to 170 - 173 pounds.  Her weight is now usually 1 76-178.   She reports her shortness of breath and dyspnea stable from previous visits.  She has stable ankle swelling and abdominal distention/bloating.  We reviewed that amlodipine can contribute to LEE.  Without her CPAP, she has trouble laying flat in bed before going to sleep.  She continues to have some shortness of breath and chest discomfort with walking, unchanged from previous visits.  She at times continues to occasionally feel lightheaded with lower extremity weakness, and she reports this is going on today.  She has episodes of blurry vision that come and go.  We checked her glucose and today it is 129 at the time of her visit.  She reports that she is now sleeping well most nights with only 1-2 nights of no or limited sleep.  Melatonin did not help.  She reports stressors surrounding her husband just breaking his hip.  She wonders if some of her symptoms are connected to her right sinus, which is blocked. She denies any  further tachypalpitations.  She reports recent issues with her Repatha, as an she was not able to click the trigger and had 2 doses replaced.  As result, she has a window of time over the last month where she was not on her Repatha.  We discussed obtaining LDL recheck at follow-up, rather than today's visit, given the hole and her Repatha dose.   Home Medications    Current Outpatient Medications on File Prior to Visit  Medication Sig Dispense Refill   acetaminophen (TYLENOL) 650 MG CR tablet Take 650 mg by mouth at bedtime.     amLODipine (NORVASC) 5 MG tablet Take 0.5 tablets (2.5 mg total) by mouth daily. 30 tablet 5   apixaban (ELIQUIS) 5 MG TABS tablet Take 1 tablet (5 mg total) by mouth 2 (two) times daily. 180 tablet 1   Ascorbic Acid (VITAMIN C PO) Take 500 mg by mouth daily.     carboxymethylcellulose (REFRESH PLUS) 0.5 % SOLN Place 1 drop into both eyes daily as needed (dry eyes).     Cholecalciferol (VITAMIN D3) 5000 units TABS Take 5,000 Units by mouth every morning.     Continuous Blood Gluc Receiver (DEXCOM G6 RECEIVER) DEVI Use as instructed to check blood sugar daily. 1 each 1   Continuous Blood Gluc Sensor (DEXCOM G6 SENSOR) MISC Use as instructed to check blood sugar daily 9 each 3   Continuous Blood Gluc Transmit (DEXCOM G6 TRANSMITTER) MISC Use as instructed to check blood sugar. 1 each 3   diltiazem (CARDIZEM) 30 MG tablet Take 1 tablet every 4 hours AS NEEDED for heart rate >100 30 tablet 1   docusate sodium (COLACE)  100 MG capsule Take 100 mg by mouth daily.     Evolocumab (REPATHA) 140 MG/ML SOSY Inject 140 mg into the skin every 14 (fourteen) days. 2.1 mL 12   fenofibrate 54 MG tablet Take 54 mg by mouth daily.     glucose blood test strip Use as instructed to check blood sugar 2 times daily 200 each 3   Insulin Degludec (TRESIBA FLEXTOUCH [place]) Inject 10 Units into the skin daily.     Insulin Pen Needle (PEN NEEDLES) 32G X 4 MM MISC Use to inject into the skin daily 100  each 3   Insulin Syringe-Needle U-100 (INSULIN SYRINGE .3CC/31GX5/16") 31G X 5/16" 0.3 ML MISC Use to inject insulin twice daily 200 each 3   levothyroxine (SYNTHROID) 112 MCG tablet Take 1 tablet (112 mcg total) by mouth daily. 90 tablet 0   losartan (COZAAR) 50 MG tablet Take 1 tablet (50 mg total) by mouth daily. 90 tablet 1   Multiple Vitamins-Minerals (ICAPS AREDS 2) CAPS Take 1 capsule by mouth 2 (two) times daily.     nitroGLYCERIN (NITROSTAT) 0.4 MG SL tablet Place 1 tablet (0.4 mg total) under the tongue every 5 (five) minutes as needed for chest pain. 25 tablet 3   pantoprazole (PROTONIX) 40 MG tablet Take 1 tablet (40 mg total) by mouth daily. 90 tablet 3   polyethylene glycol (MIRALAX / GLYCOLAX) 17 g packet Take 17 g by mouth daily as needed for moderate constipation.     potassium chloride (KLOR-CON) 10 MEQ tablet Take 1.5 tablets (15 mEq total) by mouth daily. 45 tablet 5   Semaglutide, 2 MG/DOSE, (OZEMPIC, 2 MG/DOSE,) 8 MG/3ML SOPN Inject 2 mg into the skin once a week. 9 mL 3   torsemide (DEMADEX) 20 MG tablet Take 1.5 tablets (30 mg total) by mouth daily. 45 tablet 5   vitamin B-12 (CYANOCOBALAMIN) 500 MCG tablet Take 500 mcg by mouth daily.     Current Facility-Administered Medications on File Prior to Visit  Medication Dose Route Frequency Provider Last Rate Last Admin   sodium chloride flush (NS) 0.9 % injection 3 mL  3 mL Intravenous Q12H Kelin Borum D, PA-C        Review of Systems    She has abdominal distention, wt gain, SOB/DOE, LEE with weakness, dizziness, blurry vision, lightheadedness. She denies tachypalpitations, falls, or syncope. No CP.  No s/sx of bleeding.   All other systems reviewed and are otherwise negative except as noted above.  Physical Exam    VS:  BP 140/70 (BP Location: Left Arm, Patient Position: Sitting, Cuff Size: Normal)   Pulse (!) 58   Ht '5\' 2"'$  (1.575 m)   Wt 180 lb (81.6 kg)   SpO2 99%   BMI 32.92 kg/m  , BMI Body mass index is  32.92 kg/m. GEN: Well nourished, well developed, in no acute distress. Joined by her dog. HEENT: normal. Neck: Supple, no JVD, carotid bruits, or masses. Cardiac: bradycardic but regular, 1/6 systolic murmur. No rubs, or gallops. No clubbing, cyanosis, nonpitting bilateral moderate edema. Radials/DP/PT 2+ and equal bilaterally.   Respiratory:  Respirations regular and unlabored, slight R base crackles. GI: soft, not distended, BS + x 4. MS: no deformity or atrophy. Skin: warm and dry, no rash. Neuro:  Strength and sensation are intact. Psych: Normal affect.  Accessory Clinical Findings    ECG personally reviewed by me today - 58 bpm, AV dual paced with QTc  575m- no acute changes.  VITALS Reviewed  today   Temp Readings from Last 3 Encounters:  09/18/20 97.7 F (36.5 C) (Oral)  06/22/20 97.7 F (36.5 C) (Oral)  03/20/19 (!) 97.5 F (36.4 C)   BP Readings from Last 3 Encounters:  11/20/20 140/70  10/06/20 140/78  09/29/20 130/80   Pulse Readings from Last 3 Encounters:  11/20/20 (!) 58  10/06/20 (!) 58  09/29/20 (!) 55    Wt Readings from Last 3 Encounters:  11/20/20 180 lb (81.6 kg)  10/06/20 180 lb (81.6 kg)  09/29/20 177 lb 12.8 oz (80.6 kg)     LABS  reviewed today   Lab Results  Component Value Date   WBC 5.5 08/31/2020   HGB 13.6 08/31/2020   HCT 40.7 08/31/2020   MCV 87 08/31/2020   PLT 211 08/31/2020   Lab Results  Component Value Date   CREATININE 1.44 (H) 11/03/2020   BUN 25 (H) 11/03/2020   NA 140 11/03/2020   K 3.8 11/03/2020   CL 103 11/03/2020   CO2 23 11/03/2020   Lab Results  Component Value Date   ALT 13 08/20/2020   AST 26 08/20/2020   ALKPHOS 129 (H) 08/20/2020   BILITOT 1.1 08/20/2020   Lab Results  Component Value Date   CHOL 219 (H) 08/20/2020   HDL 41 08/20/2020   LDLCALC 124 (H) 08/20/2020   LDLDIRECT 63 09/17/2019   TRIG 269 (H) 08/20/2020   CHOLHDL 5.3 08/20/2020    Lab Results  Component Value Date   HGBA1C 6.5  (A) 07/30/2020   Lab Results  Component Value Date   TSH 0.88 03/31/2020     STUDIES/PROCEDURES reviewed today   [facility] 09/18/20 Right Heart Pressures RA (mean): 12 mmHg RV (S/EDP): 46/12 mmHg PA (S/D, mean): 45/16 (26) mmHg PCWP (mean): 16 mmHg  Ao sat: 99% PA sat: 73%  Fick CO: 3.6 L/min Fick CI: 2.0 L/min/m^2   Left Ventricle LV end diastolic pressure is normal. LVEDP 15 mmHg.  Aortic Valve There is no aortic valve stenosis   Coronary Diagrams  Diagnostic Dominance: Right  Severe single-vessel coronary artery disease with 70% stenosis involving small, bifurcating D2 branch that is similar to prior catheterization in 2019.  Mild to moderate, nonobstructive LAD disease also appears similar. Patent LCx/OM1 stent with 30-40% proximal in-stent restenosis, new from 2019. Upper normal left heart filling pressures. Mildly elevated pulmonary artery and right heart pressures. Mildly reduced Fick cardiac output/index. Marked systemic hypertension with systolic pressures greater than 200 mmHg. Recommendations: Escalate diuresis; increase torsemide to 20 mg daily.  BMP should be checked when Ms. Anselmi is seen for follow-up by Dr. Caryl Comes or Ms. Damarko Stitely later this month. Add amlodipine 2.5 mg daily for improved blood pressure control and antianginal therapy. Secondary prevention of coronary artery disease.  Recommend medical therapy of D2 branch, which is too small for PCI.   MPI 08/26/2020 Pharmacological myocardial perfusion imaging study with no significant  Ischemia Small region of fixed defect in the mid to distal anteroseptal and apical region consistent with prior MI. GI uptake artifact. Hypokinesis of the mid to distal anteroseptal wall,  EF estimated at 57% No EKG changes concerning for ischemia at peak stress or in recovery. Low risk scan  Echo 08/04/20  1. Left ventricular ejection fraction, by estimation, is 50 to 55%. The  left ventricle has low normal function. Left  ventricular endocardial  border not optimally defined to evaluate regional wall motion. There is  mild left ventricular hypertrophy. Left  ventricular diastolic parameters  are consistent with Grade II diastolic  dysfunction (pseudonormalization). Elevated left atrial pressure.   2. Right ventricular systolic function is normal. The right ventricular  size is normal. Tricuspid regurgitation signal is inadequate for assessing  PA pressure.   3. Left atrial size was mildly dilated.   4. The mitral valve is degenerative. Mild to moderate mitral valve  regurgitation. No evidence of mitral stenosis.   5. The aortic valve is tricuspid. There is moderate calcification of the  aortic valve. There is moderate thickening of the aortic valve. Aortic  valve regurgitation is trivial.   6. Aortic dilatation noted. There is mild dilatation of the ascending  aorta, measuring 37 mm.   7. Mildly dilated pulmonary artery.   [facility] 05/03/2017 Relatively normal right heart cath pressures with only mildly elevated LVEDP/PCP. Prox LAD to Mid LAD lesion is 45% stenosed. Mid LAD lesion is 40% stenosed. Neither lesion was flow-limiting Lat 2nd Diag lesion is 70% stenosed. Vessel is way too small for PCI Previously placed Ost 1st Mrg stent (unknown type) is widely patent. Angiographically moderate disease in the mid and distal LAD with patent stent to the circumflex.  Otherwise no significant disease. Mildly elevated LVEDP and PCWP with essentially normal pulmonary pressures. Adequately diuresed Suspect that patient's decompensation was related to A. fib with RVR and likely diastolic heart failure. Plan: Patient will return to nursing unit for ongoing care with TR band removal. Gentle hydration post cath Defer further plans to [facility] / EP service.  Echo 04/2017 Procedure narrative: Transthoracic echocardiography. Technically    difficult study with reduced echo windows.  - Left ventricle: The  cavity size was normal. Wall thickness was    increased in a pattern of mild LVH. Systolic function was normal.    The estimated ejection fraction was in the range of 50% to 55%.    Doppler parameters are consistent with abnormal left ventricular    relaxation (grade 1 diastolic dysfunction). The E/e&' ratio is    between 8-15, suggesting indeterminate LV filling pressure.  - Aortic valve: Trileaflet. Sclerosis without stenosis. There was    trivial regurgitation.  - Mitral valve: Mildly thickened leaflets . There was trivial    regurgitation.  - Left atrium: The atrium was normal in size.  - Inferior vena cava: The vessel was normal in size. The    respirophasic diameter changes were in the normal range (>= 50%),    consistent with normal central venous pressure.   Assessment & Plan    Chronic diastolic heart failure, PHTN, mild to moderate mitral regurgitation --Reports ongoing sx with diuresis adjusted several times since her last visit due to renal function. Echo EF 50 to 55%, mild LVH, G2 DD, mild LAE, mild to moderate MR, moderate thickening of the aortic valve/calcification with trivial TR, mildly dilated pulmonary artery, ascending aorta 37 mm. Cath with upper nl left hear filling pressures, and mildly elevated PA and RH pressures. CO/CI mildly reduced. Continue torsemide '30mg'$  daily at this time with KCL tab 14mq and sliding scale for wt gain 3lbs overnight, 5lbs 1 week. Likely diastolic heart failure complicated by mitral regurgitation, episodes of atrial fibrillation, pulmonary hypertension, and sleep apnea as outlined above.  Continue ARB and amlodipine 2.'5mg'$  daily with monitoring of any worsening LEE on amlodipine.  Coronary artery dz s/p PCI (2019) -- Reports ongoing sx with occasional improvement with diuresis. Severe single vessel CAD noted but similar to prior 2019 cath. 70%s of D2 branch with mild to moderate  nonobstructive LSD dz. Patent Lcx/OM1 stent with 30-40% pISR, new  from 2019.  Recommendation was for secondary prevention of CAD and medical therapy of D2 branch, which is too small for PCI. No ASA given chronic OAC. Not on a BB. Continue amlodipine and discontinue PRN Cardizem given EF 50-55%.  She is tolerating the sublingual nitro for CP despite history of headaches with Imdur.  Recommend ongoing aggressive risk factor modification.  Continue Repatha.  Ongoing dietary and lifestyle changes.  Atrial fibrillation s/p DCCV 06/22/20 -- Resolution of tachypalpitations.  Prior PVI and redo 7/19. She does report intermittent lightheadedness but denies any syncope.  She remains in sinus rhythm today s/p DCCV 06/22/20 with device report of Afib after cardioversion. Per EP notes, p wave oversensing with false detection of Afib. Per Afib clinic notes, her afib burden at baseline is ~8%, and if more persistent episodes, dofetilide admission could be considered in the future.  As previously noted, A. fib with is likely exacerbating her volume status. Continue Eliquis '5mg'$  BID for anticoagulation with CHA2DS2VASc score of at least  7. No s/sx of bleeding.  Essential Hypertension, goal BP less than 130/80 -- BP not yet at goal. SBP over 248mHg during cath with diuresis increased and amlodipine 2.'5mg'$  started. She does report ankle edema and will hold off on increasing to '5mg'$  at this time. Continue to watch LEE on amlodipine- we did discuss this today. She is tolerating her ARB. Sliding scale diuresis as above. Avoiding hydralazine, given reflex tachycardia with history of A. Fib. Avoiding clonidine, given risk of rebound hypertension. Avoiding Imdur given report of HA in the past - could retrial but will defer for now. Continue to monitor BP at home. Salt and fluid restrictions reviewed.  Ongoing treatment of sleep apnea recommended.  Presyncope / dizziness --Reports today that dizziness not exacerbated by position changes and also occurs when seated. Exacerbated with head movement. We  did discuss ENT / inner ear etiology. Blood sugar wnl.   Hyperlipidemia, Hypertriglyceridemia Statin intolerance --Previous LDL not at goal and elevated Tg noted.  She started Repatha but had an interruption between doses - recheck LDL at RTC.  We will continue fenofibrate for now and pending repeat labs.  SSS s/p PPM / St. Jude --S/p PPM. Continue to follow with EP as directed.  OSA --Continue CPAP. Referral to Dr. GPatsey Bertholdof pulmonology.  DM2 --Defer to endocrinology.  Could consider addition of SGLT2 inhibitor at future clinic visits if Cr allows.  CKD --Recheck BMET today.  Long history of renal insufficiency limiting diuresis as outlined above.  Caution with nephrotoxins.  Mild aortic dilation -- Recommend ongoing control of heart rate, BP, and cholesterol.  BP suboptimal with plan to control as above.  Started on Repatha.  Continue Eliquis in lieu of ASA.  Avoid fluoroquinolones and heavy lifting.  Recommend annual echo to monitor.    --------------------------------- --------------------------  Medication changes:  -Sliding scale torsemide '30mg'$  qd and KCL tab 14m daily if gains 3lb overnight or 5lb in 1 week.   Labs ordered:  --BMET -Studies / Imaging ordered:   --None  Future considerations:  --BP control optimization --Repeat lipid and liver function  --If LEE not worsened by amlodipine, increased dose for BP support -Disposition:  --RTC 3-6 months  *Please be aware that the above documentation was completed voice recognition software and may contain dictation errors.      JaArvil ChacoPA-C 11/20/2020

## 2020-11-20 NOTE — Patient Instructions (Addendum)
Medication Instructions:  Please continue your current medication  *If you need a refill on your cardiac medications before your next appointment, please call your pharmacy*   Lab Work: BMP (today) [place], ABNORMAL RESULTS WILL BE CALLED Lipids & Liver (heptic function) at your next follow-up appointment  Testing/Procedures: None  Follow-Up: At [facility], you and your health needs are our priority.  As part of our continuing mission to provide you with exceptional heart care, we have created designated Provider Care Teams.  These Care Teams include your primary Cardiologist (physician) and Advanced Practice Providers (APPs -  Physician Assistants and Nurse Practitioners) who all work together to provide you with the care you need, when you need it.  Your next appointment:   3-6 month(s)  The format for your next appointment:   In Person  Provider:   You may see Dr. Caryl Comes or one of the following Advanced Practice Providers on your designated Care Team:   Murray Hodgkins, NP Christell Faith, PA-C [name], PA-C [place], [place]

## 2020-11-21 LAB — BASIC METABOLIC PANEL
BUN/Creatinine Ratio: 12 (ref 12–28)
BUN: 16 mg/dL (ref 8–27)
CO2: 26 mmol/L (ref 20–29)
Calcium: 9.4 mg/dL (ref 8.7–10.3)
Chloride: 106 mmol/L (ref 96–106)
Creatinine, Ser: 1.31 mg/dL — ABNORMAL HIGH (ref 0.57–1.00)
Glucose: 105 mg/dL — ABNORMAL HIGH (ref 65–99)
Potassium: 4.4 mmol/L (ref 3.5–5.2)
Sodium: 145 mmol/L — ABNORMAL HIGH (ref 134–144)
eGFR: 41 mL/min/{1.73_m2} — ABNORMAL LOW (ref >59)

## 2020-11-24 ENCOUNTER — Ambulatory Visit: Payer: Medicare Other | Admitting: Pulmonary Disease

## 2020-11-24 ENCOUNTER — Other Ambulatory Visit: Payer: Self-pay

## 2020-11-24 ENCOUNTER — Encounter: Payer: Self-pay | Admitting: Pulmonary Disease

## 2020-11-24 VITALS — BP 132/80 | HR 68 | Temp 97.5°F | Ht 62.0 in | Wt 181.8 lb

## 2020-11-24 DIAGNOSIS — N189 Chronic kidney disease, unspecified: Secondary | ICD-10-CM

## 2020-11-24 DIAGNOSIS — I340 Nonrheumatic mitral (valve) insufficiency: Secondary | ICD-10-CM

## 2020-11-24 DIAGNOSIS — R0602 Shortness of breath: Secondary | ICD-10-CM

## 2020-11-24 DIAGNOSIS — I480 Paroxysmal atrial fibrillation: Secondary | ICD-10-CM

## 2020-11-24 DIAGNOSIS — I5032 Chronic diastolic (congestive) heart failure: Secondary | ICD-10-CM

## 2020-11-24 DIAGNOSIS — G4733 Obstructive sleep apnea (adult) (pediatric): Secondary | ICD-10-CM

## 2020-11-24 NOTE — Patient Instructions (Addendum)
We are going to get breathing test.  We are also going to schedule an overnight oxygen test on your CPAP to make sure you are getting enough oxygen.  We are referring you to the kidney specialist since your kidney function is starting to decrease some.  See him in follow-up in 2 months time we will call you with the results of your tests.

## 2020-11-24 NOTE — Progress Notes (Signed)
Subjective:    Patient ID: Dawn Garcia [facility], female    DOB: 09-16-1940, 80 y.o.   MRN: [id number] Chief Complaint  Patient presents with   pulmonary consult    Per Dr. Jonnie Finner with exertion.     HPI Patient is a 81 year old very remote former smoker with minimal [place] exposure to tobacco but significant secondhand exposure who presents for evaluation of shortness of breath with exertion.  There is a request for follow-up on obstructive sleep apnea.  Patient is referred by Lorenso Quarry, PA-C and by Dr. Saunders Revel.  The patient has an extensive cardiac history as noted below.  Significant issues with paroxysmal atrial fibrillation status post DC cardioversion and ablation in the past.  Issues with diastolic heart failure on a chronic basis, pacemaker implantation in the past and mitral regurgitation.  The patient notes occasional wheezing particularly when she does not take her Lasix.  Currently not on any inhalers.  The patient describes her dyspnea mostly as fatigue which is "extreme".  Sometimes notices some shortness of breath with activities.  She has chronic issues with chest discomfort at rest and with activity particularly on the right.  She also has episodes of lightheadedness and dizziness associated with the symptoms.  She has issues with orthopnea and lower extremity as well as abdominal swelling and edema.  She sleeps with CPAP and is compliant with it.  Does have gastroesophageal reflux symptoms for which she takes Protonix.  She states Protonix controls GERD.  She notes that stress also aggravates her symptoms of shortness of breath.  She has had diagnosis of obstructive sleep apnea since 2007.  She was evaluated by Dr. Raul Del in 2017 with spirometry as follows FEV1 was 1.86 L or 96% of predicted, FVC 2.63 L or 105% predicted, FEV1/FVC 71%, total lung capacity 124% RV 152% diffusion capacity normal.  Mild small airways component.  This was consistent with a minimal obstructive  defect mostly on the basis of small airways component.  She has been told in the past that she had pulmonary fibrosis however high resolution CT chest performed in 2019 showed no evidence of interstitial lung disease.  Just some mild postinfection/postinflammatory scarring in the mid to lower lungs which was stable.  Is not pulmonary fibrosis.  She smoked remotely in her 59s with very minimal active use of tobacco however she was married to a heavy cigarette smoker.  No military history.  Has resided in [place], [place] and [place].  She is a retired [employer] scientist.  No recent travel outside of the country.  She has no exotic pets just  1 dog in the home.  No unusual hobbies  Review of Systems A 10 point review of systems was performed and it is as noted above otherwise negative.  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[place])    Arthritis    "all over; particularly in hands/fingers; all my joints" (10/05/2017)   Atrial fibrillation (HCC)    CAD (coronary artery disease)    CHF (congestive heart failure) ([place])     A-Fib   Chronic kidney disease    COPD (chronic obstructive pulmonary disease) ([place])    "very mild" (10/05/2017)   Family history of adverse reaction to anesthesia    "daughter w/PONV & woke up during endoscopy" (10/05/2017)   GERD (gastroesophageal reflux disease)    Gout    Graves' disease    S/P "  radioactive tx"   Heart disease    History of blood transfusion    "related to shoulder replacement"    History of gout    "no longer on daily RX" (10/05/2017)   History of hiatal hernia    Hyperlipidemia    Hypertension    Hypocalcemia    Hypokalemia    Hypothyroidism    Macular degeneration    Melanoma ([place]) ~ 2012   "off my back" (10/05/2017)   Myocardial infarction [facility])    "was told I'd had one; don't really know when" (10/05/2017)   Orthostatic hypotension 2020   OSA on CPAP     Pacemaker    Presence of permanent cardiac 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   Renal disorder    Scarring of lung 04/2017   "found by radiology" (10/05/2017)   Sinus node dysfunction (HCC)    Type II diabetes mellitus ([place])    Vitamin B12 deficiency    Vitamin D deficiency    Past Surgical History:  Procedure Laterality Date   APPENDECTOMY     ATRIAL FIBRILLATION ABLATION N/A 10/05/2017   Procedure: ATRIAL FIBRILLATION ABLATION;  Surgeon: Constance Haw, MD;  Location: [place] [facility];  Service: Cardiovascular;  Laterality: N/A;   ATRIAL FIBRILLATION ABLATION  ~ 2014   CARDIAC CATHETERIZATION  04/2017   CARDIOVERSION N/A 06/22/2020   Procedure: CARDIOVERSION;  Surgeon: Nelva Bush, MD;  Location: [facility] OR[facility];  Service: Cardiovascular;  Laterality: N/A;   COLONOSCOPY W/ BIOPSIES AND POLYPECTOMY  2015   1 polyp removed- repeat 3 years   CORONARY ANGIOPLASTY WITH STENT PLACEMENT  ~ 2014   EP IMPLANTABLE DEVICE N/A 11/10/2015   Procedure: Pacemaker Implant;  Surgeon: Will [name], MD;  Location: [place] [facility];  Service: Cardiovascular;  Laterality: N/A;   EP IMPLANTABLE DEVICE N/A 11/11/2015   Procedure: Lead Revision/Repair;  Surgeon: Evans Lance, MD;  Location: [place] [facility];  Service: Cardiovascular;  Laterality: N/A;   INGUINAL HERNIA REPAIR Left 1951   JOINT REPLACEMENT     LAPAROSCOPIC CHOLECYSTECTOMY     MELANOMA EXCISION  ~ 2012   "off my back" (10/05/2017)   PARATHYROIDECTOMY     "removed 3 glands; still have 1 gland" (10/05/2017)   RIGHT/LEFT HEART CATH AND CORONARY ANGIOGRAPHY N/A 05/03/2017   Procedure: RIGHT/LEFT HEART CATH AND CORONARY ANGIOGRAPHY;  Surgeon: Leonie Man, MD;  Location: [place] [facility];  Service: Cardiovascular;  Laterality: N/A;   RIGHT/LEFT HEART CATH AND CORONARY ANGIOGRAPHY N/A 09/18/2020   Procedure: RIGHT/LEFT HEART CATH AND CORONARY ANGIOGRAPHY;  Surgeon: Nelva Bush, MD;  Location: [place] [facility];  Service: Cardiovascular;   Laterality: N/A;   TONSILLECTOMY     TOTAL SHOULDER ARTHROPLASTY Right x 2   VAGINAL HYSTERECTOMY     "total"   Family History  Problem Relation Age of Onset   Thyroid disease Mother 78       3 days after surgery   Heart defect Father 53       ?ascending aortic aneurysm   Breast cancer Sister    Hepatitis Brother    Diabetes Paternal Uncle    Dementia Paternal Uncle    Colon cancer Maternal Grandmother    Diabetes Maternal Grandmother    Diabetes Maternal Grandfather    Colon cancer Paternal Grandmother        65's   Rectal cancer Son 60   Breast cancer Half-Sister    Colon polyps Neg Hx    Stomach cancer Neg Hx  Liver cancer Neg Hx    Pancreatic cancer Neg Hx    Social History   Tobacco Use   Smoking status: Former    Packs/day: 2.00    Years: 3.00    Pack years: 6.00    Types: Cigarettes    Quit date: 09/25/1968    Years since quitting: 52.2   Smokeless tobacco: Never   Tobacco comments:    10/05/2017 "smoked in my 20's"  Substance Use Topics   Alcohol use: Not Currently    Comment: 10/05/2017 "1 mixed drink q 2-3 months"   Current Meds  Medication Sig   acetaminophen (TYLENOL) 650 MG CR tablet Take 650 mg by mouth at bedtime.   amLODipine (NORVASC) 5 MG tablet Take 0.5 tablets (2.5 mg total) by mouth daily.   Ascorbic Acid (VITAMIN C PO) Take 500 mg by mouth daily.   azithromycin (ZITHROMAX) 250 MG tablet Take 250 mg by mouth as directed.   carboxymethylcellulose (REFRESH PLUS) 0.5 % SOLN Place 1 drop into both eyes daily as needed (dry eyes).   Cholecalciferol (VITAMIN D3) 5000 units TABS Take 5,000 Units by mouth every morning.   Continuous Blood Gluc Receiver (DEXCOM G6 RECEIVER) DEVI Use as instructed to check blood sugar daily.   Continuous Blood Gluc Sensor (DEXCOM G6 SENSOR) MISC Use as instructed to check blood sugar daily   Continuous Blood Gluc Transmit (DEXCOM G6 TRANSMITTER) MISC Use as instructed to check blood sugar.   diltiazem (CARDIZEM) 30 MG  tablet Take 1 tablet every 4 hours AS NEEDED for heart rate >100   docusate sodium (COLACE) 100 MG capsule Take 100 mg by mouth daily.   fenofibrate 54 MG tablet Take 54 mg by mouth daily.   glucose blood test strip Use as instructed to check blood sugar 2 times daily   Insulin Pen Needle (PEN NEEDLES) 32G X 4 MM MISC Use to inject into the skin daily   Insulin Syringe-Needle U-100 (INSULIN SYRINGE .3CC/31GX5/16") 31G X 5/16" 0.3 ML MISC Use to inject insulin twice daily   losartan (COZAAR) 50 MG tablet Take 1 tablet (50 mg total) by mouth daily.   Multiple Vitamins-Minerals (ICAPS AREDS 2) CAPS Take 1 capsule by mouth 2 (two) times daily.   pantoprazole (PROTONIX) 40 MG tablet Take 1 tablet (40 mg total) by mouth daily.   polyethylene glycol (MIRALAX / GLYCOLAX) 17 g packet Take 17 g by mouth daily as needed for moderate constipation.   potassium chloride (KLOR-CON) 10 MEQ tablet Take 1.5 tablets (15 mEq total) by mouth daily.   torsemide (DEMADEX) 20 MG tablet Take 1.5 tablets (30 mg total) by mouth daily.   vitamin B-12 (CYANOCOBALAMIN) 500 MCG tablet Take 500 mcg by mouth daily.   [DISCONTINUED] apixaban (ELIQUIS) 5 MG TABS tablet Take 1 tablet (5 mg total) by mouth 2 (two) times daily.   [DISCONTINUED] Evolocumab (REPATHA) 140 MG/ML SOSY Inject 140 mg into the skin every 14 (fourteen) days.   [DISCONTINUED] Insulin Degludec (TRESIBA FLEXTOUCH [place]) Inject 8-10 Units into the skin daily.   [DISCONTINUED] levothyroxine (SYNTHROID) 112 MCG tablet Take 1 tablet (112 mcg total) by mouth daily.   [DISCONTINUED] Semaglutide, 2 MG/DOSE, (OZEMPIC, 2 MG/DOSE,) 8 MG/3ML SOPN Inject 2 mg into the skin once a week.   Immunization History  Administered Date(s) Administered   Fluad Quad(high Dose 65+) 12/27/2018   Influenza, High Dose Seasonal PF 11/29/2016   Influenza,inj,Quad PF,6+ Mos 12/08/2015   Influenza-Unspecified 12/08/2015, 11/12/2017   Pneumococcal Polysaccharide-23 08/19/2015  Objective:   Physical Exam BP 132/80 (BP Location: Left Arm, Cuff Size: Normal)   Pulse 68   Temp (!) 97.5 F (36.4 C) (Temporal)   Ht '5\' 2"'$  (1.575 m)   Wt 181 lb 12.8 oz (82.5 kg)   SpO2 98%   BMI 33.25 kg/m   GENERAL: Overweight woman, chronically ill-appearing, no acute distress.  No conversational dyspnea. HEAD: Normocephalic, atraumatic.  EYES: Pupils equal, round, reactive to light.  No scleral icterus.  MOUTH: Nose/mouth/throat not examined due to masking requirements for COVID 19. NECK: Supple. No thyromegaly. Trachea midline. No JVD.  No adenopathy. PULMONARY: Good air entry bilaterally.  No adventitious sounds. CARDIOVASCULAR: S1 and S2. Regular rate and rhythm.  Grade 2/6 systolic ejection murmur consistent with mitral regurgitation. ABDOMEN: Protuberant, soft, otherwise benign. MUSCULOSKELETAL: No joint deformity, no clubbing, no edema.  NEUROLOGIC: No overt focal deficit.  Awake and alert, speech fluent. SKIN: Intact,warm,dry. PSYCH: Mood and behavior normal.   Ambulatory oximetry: Was performed today, oxygen saturations remained at 96 to 98%, patient has a blunted cardiac response to exercise.  Heart rate at rest was 55, heart rate at peak exercise was 77.    Assessment & Plan:     ICD-10-CM   1. Shortness of breath  R06.02 Pulse oximetry, overnight    Pulmonary Function Test [facility] Only   Multifactorial Not convinced primary pulmonary problem Will obtain PFTs to better characterize    2. Chronic diastolic (congestive) heart failure (HCC)  I50.32    This issue adds complexity to her management Has required multiple diuretic adjustments lately     3. Moderate mitral regurgitation  I34.0    This adds complexity to her management Query need for reassessment    4. Paroxysmal atrial fibrillation (HCC)  I48.0    On anticoagulation Pacemaker present Adds complexity to her management    5. Chronic kidney disease, unspecified CKD stage  N18.9 Ambulatory  referral to Nephrology   Referral to nephrology Patient's renal function is starting to deteriorate    6. OSA (obstructive sleep apnea)  G47.33    Obtain overnight oximetry on CPAP May need oxygen bled in Will set up with sleep medicine     Orders Placed This Encounter  Procedures   Ambulatory referral to Nephrology    Referral Priority:   Routine    Referral Type:   Consultation    Referral Reason:   Specialty Services Required    Requested Specialty:   Nephrology    Number of Visits Requested:   1   Pulse oximetry, overnight    Standing Status:   Future    Standing Expiration Date:   11/24/2021    Scheduling Instructions:     On cpap          [id number]   Pulmonary Function Test [facility] Only    Standing Status:   Future    Number of Occurrences:   1    Standing Expiration Date:   11/24/2021    Scheduling Instructions:     Next available.    Order Specific Question:   Full PFT: includes the following: basic spirometry, spirometry pre & post bronchodilator, diffusion capacity (DLCO), lung volumes    Answer:   Full PFT   Rasheena has issues with dyspnea on exertion occasionally.  Her main complaint however is that of FATIGUE which she describes as "extreme".  This is usually not a pulmonary complaint but mostly a complaint of cardiac insufficiency versus severe deconditioning.  She has multiple  other issues that may be aggravating her symptomatology including obstructive sleep apnea.  We will proceed with checking a oximetry on her current CPAP to make sure she is getting adequate control of her sleep apnea and does not need additional oxygen supplementation.  Her prior PFTs and CT chest are not consistent with COPD nor pulmonary fibrosis.  We will obtain PFTs to reevaluate these issues.  The oximetry today failed to show any oxygen desaturations.  Her oxygen saturations remained at 96 to 98% throughout the study.  Her oximetry showed that she had a blunted cardiac response to exercise  and this may be adding to her sensation of dyspnea.  May need reevaluation of the condition of her mitral valve.  I am also concerned that her renal function is deteriorating and will refer her to nephrology.  Follow-up in 2 months time she is to contact us prior to that time should any new difficulties arise.   Renold Don, MD Advanced Bronchoscopy PCCM [facility]    *This note was dictated using voice recognition software/Dragon.  Despite best efforts to proofread, errors can occur which can change the meaning.  Any change was purely unintentional.

## 2020-11-27 ENCOUNTER — Ambulatory Visit (INDEPENDENT_AMBULATORY_CARE_PROVIDER_SITE_OTHER): Payer: Medicare Other | Admitting: Internal Medicine

## 2020-11-27 ENCOUNTER — Encounter: Payer: Self-pay | Admitting: Internal Medicine

## 2020-11-27 ENCOUNTER — Other Ambulatory Visit: Payer: Self-pay

## 2020-11-27 VITALS — BP 130/80 | HR 62 | Ht 62.0 in | Wt 178.0 lb

## 2020-11-27 DIAGNOSIS — E1322 Other specified diabetes mellitus with diabetic chronic kidney disease: Secondary | ICD-10-CM

## 2020-11-27 DIAGNOSIS — E890 Postprocedural hypothyroidism: Secondary | ICD-10-CM | POA: Diagnosis not present

## 2020-11-27 DIAGNOSIS — E559 Vitamin D deficiency, unspecified: Secondary | ICD-10-CM

## 2020-11-27 DIAGNOSIS — Z9089 Acquired absence of other organs: Secondary | ICD-10-CM

## 2020-11-27 DIAGNOSIS — E892 Postprocedural hypoparathyroidism: Secondary | ICD-10-CM | POA: Diagnosis not present

## 2020-11-27 LAB — POCT GLYCOSYLATED HEMOGLOBIN (HGB A1C): Hemoglobin A1C: 5.8 % — AB (ref 4.0–5.6)

## 2020-11-27 MED ORDER — TRESIBA FLEXTOUCH 100 UNIT/ML ~~LOC~~ SOPN: 8.0000 [IU] | PEN_INJECTOR | Freq: Every day | SUBCUTANEOUS | 3 refills | Status: DC

## 2020-11-27 NOTE — Patient Instructions (Addendum)
Please continue: - Ozempic 2 mg weekly  Please decrease: - Tresiba 8 units daily  Please continue: - Levothyroxine 112 mcg daily  Take the thyroid hormone every day, with water, at least 30 minutes before breakfast, separated by at least 4 hours from: - acid reflux medications - calcium - iron - multivitamins  Please return in 4 months.

## 2020-11-27 NOTE — Progress Notes (Addendum)
Patient ID: Dawn Garcia [facility], female   DOB: [birth year], 80 y.o.   MRN: [id number]   This visit occurred during the SARS-CoV-2 public health emergency.  Safety protocols were in place, including screening questions prior to the visit, additional usage of staff PPE, and extensive cleaning of exam room while observing appropriate contact time as indicated for disinfecting solutions.   HPI  Dawn Garcia is a 80 y.o.-year-old female, initially referred by her PCP, Dr. Humphrey Rolls, presenting for follow-up for uncontrolled post ablative hypothyroidism and DM2, insulin-dependent since 2013, with long-term complications (atrial fibrillation, CHF, CKD).  Last visit 4 months ago.  Interim history: She continues intermittent fasting, started 04/2018.  Currently doing 16-8 when dose.  She lost more than 40 pounds since she started! No increased urination, nausea. She has pulmonary hypertension. She has significant fatigue, some SOB, CP. She also has blurry vision for which she sees her ophthalmologist frequently. Her husband fell and broke his hip >> he just came home from rehab. She was very busy with him being sick. She is being referred to nephrology because of her decreased kidney function.  Post ablative hypothyroidism: Pt. has been dx with Graves ds. At 80 y/o. She had RAI Tx in 1960 >> postablative hypothyroidism  >> started on levothyroxine.  Patient had consistently suppressed TSH levels over the years so we are not decreasing her doses very slowly due to fatigue.  We decreased her levothyroxine 03/2019.  Pt is on levothyroxine 112 mcg daily, taken: - in am - fasting - no b'fast - no calcium   - no iron - no multivitamins - + PPIs-as needed, later in the day - + Areds  She was on amiodarone in 03/2017, but stopped in 04/2017 due to AKI.  Reviewed her TFTs: Lab Results  Component Value Date   TSH 0.88 03/31/2020   TSH 1.27 07/29/2019   TSH 0.71 05/06/2019   TSH 0.20 (L) 03/20/2019    TSH 0.05 (L) 12/27/2018   TSH 0.03 (L) 10/02/2018   TSH 0.34 (L) 03/27/2018   TSH 0.04 (L) 02/05/2018   TSH 0.198 (L) 04/26/2017   TSH 0.174 (L) 04/18/2017   FREET4 1.28 03/31/2020   FREET4 1.08 07/29/2019   FREET4 1.13 05/06/2019   FREET4 1.25 03/20/2019   FREET4 1.70 (H) 12/27/2018   FREET4 1.35 10/02/2018   FREET4 1.55 03/27/2018   FREET4 1.40 02/05/2018   FREET4 3.54 (H) 04/26/2017   FREET4 2.08 (H) 11/08/2015   T3FREE 3.4 02/05/2018   T3FREE 1.8 (L) 04/26/2017   T3FREE 3.0 11/08/2015   T3FREE 2.8 09/09/2015   T3FREE 3.1 08/19/2015  10/25/2017: TSH 0.021 (0.45-5.33), free T4 1.7 (0.66-1.14)  Her Graves' antibodies were not elevated: Lab Results  Component Value Date   TSI 102 02/05/2018    No results found for: THGAB No components found for: TPOAB  Pt denies: - feeling nodules in neck - hoarseness - dysphagia - choking - SOB with lying down  She has + FH of thyroid disorders in: mother (died after sx for toxic goiter) and sister (goiter).  No FH of thyroid cancer. No h/o radiation tx to head or neck other than RAI treatment.  No herbal supplements. No Biotin use. No recent steroids use.   She has a history of Graves' ophthalmopathy >> had increased IO pressure and did not respond to IV steroids >> she had radiation therapy and then eyelid surgery.  DM2: -Insulin-dependent since 2013.  Reviewed HbA1c levels: Lab Results  Component Value Date  HGBA1C 6.5 (A) 07/30/2020   HGBA1C 6.9 (A) 03/31/2020   HGBA1C 5.9 (A) 11/29/2019   HGBA1C 6.1 (A) 07/29/2019   HGBA1C 7.1 (A) 04/02/2019   HGBA1C 6.5 (A) 12/27/2018   HGBA1C 6.8 (A) 10/02/2018   HGBA1C 8.5 (A) 06/27/2018   HGBA1C 8.4 (A) 02/05/2018   HGBA1C 9.4 (H) 04/28/2017  08/14/2018: HbA1c 8.0%  10/25/2017: HbA1c 8.4%  Previously on: - Novolin 70/30 -not taken in the day she is fasting: - 20 >> 15 units before b'fast - 16 >> 10 units before dinner - Ozempic 1 mg weekly in a.m.   Now on: - Ozempic 1  >> 2 mg weekly in a.m. - Tresiba 12 >> 10 units daily She stopped [place] in 12/2019 due to good control, but we had to restart it afterwards. We started Jardiance in 03/2020 but stopped due to yeast infections.  She previously also had diarrhea with it.  She checks her sugars more than 4 times a day with her Dexcom CGM:   Prev.:   + CKD, latest BUN/creatinine: Lab Results  Component Value Date   BUN 16 11/20/2020   BUN 25 (H) 11/03/2020   Lab Results  Component Value Date   CREATININE 1.31 (H) 11/20/2020   CREATININE 1.44 (H) 11/03/2020  10/25/2017: BUN/creatinine: 17/1.4, GFR 37  Latest eye exam 2022: No DR reportedly but macular degeneration; previously: + DR; she continues IO injections  She has a history of hyperparathyroidism: - h/o first sx: 1978 (2 glands excised) - h/o second sx: 2010 (1 gland excised - 3.2 g)  Her ionized calcium was slightly low in the past but normalized at last check: Lab Results  Component Value Date   CALCIUM 9.4 11/20/2020   CALCIUM 9.0 11/03/2020   CALCIUM 9.2 10/27/2020   CALCIUM 9.2 10/19/2020   CALCIUM 9.5 10/15/2020  Previously: Component     Latest Ref Rng & Units 10/02/2018  Calcium Ionized     4.8 - 5.6 mg/dL 4.89   Component     Latest Ref Rng & Units 02/05/2018 03/27/2018  Calcium Ionized     4.8 - 5.6 mg/dL 4.58 (L) 4.55 (L)   She has vitamin D deficiency: - she was on 1000 units vitamin D daily but this was stopped by PCP as her vitamin D was 80.  - we restarted 1000 units daily and she continues on this dose.   Reviewed latest vitamin D level: Lab Results  Component Value Date   VD25OH 79.5 03/31/2020   VD25OH 69.65 05/06/2019   VD25OH 57.76 10/02/2018   VD25OH 80.87 03/27/2018   VD25OH 59.0 08/19/2015   She has a history of B12 deficiency-managed by PCP.  Latest level was normal Lab Results  Component Value Date   [id number] 10/02/2018   VITAMINB12 >2000 (H) 08/19/2015   She also has a history of OSA-on  CPAP, OA, history of melanoma, macular degeneration. She has MCI.  ROS: + See HPI  I reviewed pt's medications, allergies, PMH, social hx, family hx, and changes were documented in the history of present illness. Otherwise, unchanged from my initial visit note.  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[place])    Arthritis    "all over; particularly in hands/fingers; all my joints" (10/05/2017)   Atrial fibrillation (HCC)    CAD (coronary artery disease)    CHF (congestive heart failure) (HCC)     A-Fib  Chronic kidney disease    COPD (chronic obstructive pulmonary disease) (HCC)    "very mild" (10/05/2017)   Family history of adverse reaction to anesthesia    "daughter w/PONV & woke up during endoscopy" (10/05/2017)   GERD (gastroesophageal reflux disease)    Gout    Graves' disease    S/P "radioactive tx"   Heart disease    History of blood transfusion    "related to shoulder replacement"    History of gout    "no longer on daily RX" (10/05/2017)   History of hiatal hernia    Hyperlipidemia    Hypertension    Hypocalcemia    Hypokalemia    Hypothyroidism    Macular degeneration    Melanoma ([place]) ~ 2012   "off my back" (10/05/2017)   Myocardial infarction [facility])    "was told I'd had one; don't really know when" (10/05/2017)   Orthostatic hypotension 2020   OSA on CPAP    Pacemaker    Presence of permanent cardiac 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   Renal disorder    Scarring of lung 04/2017   "found by radiology" (10/05/2017)   Sinus node dysfunction (HCC)    Type II diabetes mellitus ([place])    Vitamin B12 deficiency    Vitamin D deficiency    Past Surgical History:  Procedure Laterality Date   APPENDECTOMY     ATRIAL FIBRILLATION ABLATION N/A 10/05/2017   Procedure: ATRIAL FIBRILLATION ABLATION;  Surgeon: Constance Haw, MD;  Location: [place] [facility];  Service:  Cardiovascular;  Laterality: N/A;   ATRIAL FIBRILLATION ABLATION  ~ 2014   CARDIAC CATHETERIZATION  04/2017   CARDIOVERSION N/A 06/22/2020   Procedure: CARDIOVERSION;  Surgeon: Nelva Bush, MD;  Location: [facility];  Service: Cardiovascular;  Laterality: N/A;   COLONOSCOPY W/ BIOPSIES AND POLYPECTOMY  2015   1 polyp removed- repeat 3 years   CORONARY ANGIOPLASTY WITH STENT PLACEMENT  ~ 2014   EP IMPLANTABLE DEVICE N/A 11/10/2015   Procedure: Pacemaker Implant;  Surgeon: Will [name], MD;  Location: [place] [facility];  Service: Cardiovascular;  Laterality: N/A;   EP IMPLANTABLE DEVICE N/A 11/11/2015   Procedure: Lead Revision/Repair;  Surgeon: Evans Lance, MD;  Location: [place] [facility];  Service: Cardiovascular;  Laterality: N/A;   INGUINAL HERNIA REPAIR Left 1951   JOINT REPLACEMENT     LAPAROSCOPIC CHOLECYSTECTOMY     MELANOMA EXCISION  ~ 2012   "off my back" (10/05/2017)   PARATHYROIDECTOMY     "removed 3 glands; still have 1 gland" (10/05/2017)   RIGHT/LEFT HEART CATH AND CORONARY ANGIOGRAPHY N/A 05/03/2017   Procedure: RIGHT/LEFT HEART CATH AND CORONARY ANGIOGRAPHY;  Surgeon: Leonie Man, MD;  Location: [place] [facility];  Service: Cardiovascular;  Laterality: N/A;   RIGHT/LEFT HEART CATH AND CORONARY ANGIOGRAPHY N/A 09/18/2020   Procedure: RIGHT/LEFT HEART CATH AND CORONARY ANGIOGRAPHY;  Surgeon: Nelva Bush, MD;  Location: [place] [facility];  Service: Cardiovascular;  Laterality: N/A;   TONSILLECTOMY     TOTAL SHOULDER ARTHROPLASTY Right x 2   VAGINAL HYSTERECTOMY     "total"   Social History   Socioeconomic History   Marital status: Married    Spouse name: Not on file   Number of children: 3   Years of education: Not on file   Highest education level: Not on file  Occupational History   Occupation: Retired Designer, multimedia strain: Not on file   Food insecurity:  Worry: Not on file    Inability: Not on file    Transportation needs:    Medical: Not on file    Non-medical: Not on file  Tobacco Use   Smoking status: Former Smoker, quit in 1967    Packs/day: 2.00    Years: 3.00    Pack years: 6.00    Types: Cigarettes   Smokeless tobacco: Never Used   Tobacco comment: 10/05/2017 "smoked in my 20's"  Substance and Sexual Activity   Alcohol use: Yes    Comment: 1-2 mixed drinks per month   Drug use: Never  Lifestyle  Relationships  Social History Narrative   Lives in [place] w/ husband.  She walks her dog every day.  Current Outpatient Medications on File Prior to Visit  Medication Sig Dispense Refill   acetaminophen (TYLENOL) 650 MG CR tablet Take 650 mg by mouth at bedtime.     amLODipine (NORVASC) 5 MG tablet Take 0.5 tablets (2.5 mg total) by mouth daily. 30 tablet 5   apixaban (ELIQUIS) 5 MG TABS tablet Take 1 tablet (5 mg total) by mouth 2 (two) times daily. 180 tablet 1   Ascorbic Acid (VITAMIN C PO) Take 500 mg by mouth daily.     azithromycin (ZITHROMAX) 250 MG tablet Take 250 mg by mouth as directed.     carboxymethylcellulose (REFRESH PLUS) 0.5 % SOLN Place 1 drop into both eyes daily as needed (dry eyes).     Cholecalciferol (VITAMIN D3) 5000 units TABS Take 5,000 Units by mouth every morning.     Continuous Blood Gluc Receiver (DEXCOM G6 RECEIVER) DEVI Use as instructed to check blood sugar daily. 1 each 1   Continuous Blood Gluc Sensor (DEXCOM G6 SENSOR) MISC Use as instructed to check blood sugar daily 9 each 3   Continuous Blood Gluc Transmit (DEXCOM G6 TRANSMITTER) MISC Use as instructed to check blood sugar. 1 each 3   diltiazem (CARDIZEM) 30 MG tablet Take 1 tablet every 4 hours AS NEEDED for heart rate >100 30 tablet 1   docusate sodium (COLACE) 100 MG capsule Take 100 mg by mouth daily.     Evolocumab (REPATHA) 140 MG/ML SOSY Inject 140 mg into the skin every 14 (fourteen) days. 2.1 mL 12   fenofibrate 54 MG tablet Take 54 mg by mouth daily.     glucose blood test  strip Use as instructed to check blood sugar 2 times daily 200 each 3   Insulin Degludec (TRESIBA FLEXTOUCH [place]) Inject 10 Units into the skin daily.     Insulin Pen Needle (PEN NEEDLES) 32G X 4 MM MISC Use to inject into the skin daily 100 each 3   Insulin Syringe-Needle U-100 (INSULIN SYRINGE .3CC/31GX5/16") 31G X 5/16" 0.3 ML MISC Use to inject insulin twice daily 200 each 3   levothyroxine (SYNTHROID) 112 MCG tablet Take 1 tablet (112 mcg total) by mouth daily. 90 tablet 0   losartan (COZAAR) 50 MG tablet Take 1 tablet (50 mg total) by mouth daily. 90 tablet 1   Multiple Vitamins-Minerals (ICAPS AREDS 2) CAPS Take 1 capsule by mouth 2 (two) times daily.     nitroGLYCERIN (NITROSTAT) 0.4 MG SL tablet Place 1 tablet (0.4 mg total) under the tongue every 5 (five) minutes as needed for chest pain. 25 tablet 3   pantoprazole (PROTONIX) 40 MG tablet Take 1 tablet (40 mg total) by mouth daily. 90 tablet 3   polyethylene glycol (MIRALAX / GLYCOLAX) 17 g packet Take 17 g by  mouth daily as needed for moderate constipation.     potassium chloride (KLOR-CON) 10 MEQ tablet Take 1.5 tablets (15 mEq total) by mouth daily. 45 tablet 5   Semaglutide, 2 MG/DOSE, (OZEMPIC, 2 MG/DOSE,) 8 MG/3ML SOPN Inject 2 mg into the skin once a week. 9 mL 3   torsemide (DEMADEX) 20 MG tablet Take 1.5 tablets (30 mg total) by mouth daily. 45 tablet 5   vitamin B-12 (CYANOCOBALAMIN) 500 MCG tablet Take 500 mcg by mouth daily.     Current Facility-Administered Medications on File Prior to Visit  Medication Dose Route Frequency Provider Last Rate Last Admin   sodium chloride flush (NS) 0.9 % injection 3 mL  3 mL Intravenous Q12H Visser, Jacquelyn D, PA-C       Allergies  Allergen Reactions   Demerol [Meperidine] Nausea And Vomiting    Tolerated Fentanyl 11/10/15   Epinephrine Shortness Of Breath and Palpitations    Powder   Multaq [Dronedarone] Other (See Comments)    Kidney failure and lung problems   Sulfa Antibiotics  Anaphylaxis   Tetracyclines & Related Swelling and Other (See Comments)    Made nose, lips,  And tongue itchy   Quinolones Other (See Comments)    Aortic dilation. Avoid FQ, as these are showing to increase Ao dilation.    Statins     Foggy head, episodic amnesia    Imdur [Isosorbide Nitrate]     HA   Family History  Problem Relation Age of Onset   Thyroid disease Mother 53       3 days after surgery   Heart defect Father 41       ?ascending aortic aneurysm   Breast cancer Sister    Hepatitis Brother    Diabetes Paternal Uncle    Dementia Paternal Uncle    Colon cancer Maternal Grandmother    Diabetes Maternal Grandmother    Diabetes Maternal Grandfather    Colon cancer Paternal Grandmother        58's   Rectal cancer Son 31   Breast cancer Half-Sister    Colon polyps Neg Hx    Stomach cancer Neg Hx    Liver cancer Neg Hx    Pancreatic cancer Neg Hx    Also: -Diabetes in maternal grandparents -Hypertension and uncle-hyperlipidemia in the whole family -Heart disease in father, mother, uncle-thyroid problems in mother and sister-cancer in son, grandmother, half sister  PE: BP 130/80 (BP Location: Left Arm, Patient Position: Sitting, Cuff Size: Normal)   Pulse 62   Ht '5\' 2"'$  (1.575 m)   Wt 178 lb (80.7 kg)   SpO2 98%   BMI 32.56 kg/m  Wt Readings from Last 3 Encounters:  11/27/20 178 lb (80.7 kg)  11/24/20 181 lb 12.8 oz (82.5 kg)  11/20/20 180 lb (81.6 kg)   Constitutional: overweight, in NAD Eyes: PERRLA, EOMI, no exophthalmos ENT: moist mucous membranes, no thyromegaly, no cervical lymphadenopathy Cardiovascular: RRR, No MRG Respiratory: CTA B Gastrointestinal: abdomen soft, NT, ND, BS+ Musculoskeletal: no deformities, strength intact in all 4 Skin: moist, warm, no rashes Neurological: no tremor with outstretched hands, DTR normal in all 4  ASSESSMENT: 1. Postablative Hypothyroidism  2.  LADA, managed as type 2 diabetes  3.  History of  hypocalcemia -History of hyperparathyroidism  4.  Vitamin D deficiency  PLAN:  1. Patient with longstanding, uncontrolled, hypothyroidism, with a long history of suppressed TSH, but with normal TFTs recently -latest thyroid labs reviewed with pt. >> normal: Lab Results  Component Value Date   TSH 0.88 03/31/2020  - she continues on LT4 112 mcg daily - pt continues to feel fatigued, but weight remains stable and she does not complain of cold intolerance. - we discussed about taking the thyroid hormone every day, with water, >30 minutes before breakfast, separated by >4 hours from acid reflux medications, calcium, iron, multivitamins. Pt. is taking it correctly. - will check thyroid tests now per her request (given lab order to take to hospital in [place])  2. LADA -Patient with longstanding, previously uncontrolled type 2 diabetes, then with drastically improved control after she started intermittent fasting.  HbA1c improved to 5.9% at its nadir, but then increased to 6.9%.  At last visit, HbA1c was 6.5% and sugars were in the lower range overnight and they were increasing after lunch and after dinner.  She was not eating breakfast.  We decreased her Tresiba dose and increase the Ozempic dose to 1 mg weekly to better control postprandial blood sugars. 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 longstanding, previously uncontrolled type 2 diabetes, now with drastically improved control after she started intermittent fasting.  HbA1c improved to 5.9%, but worsened at last visit to 6.9%. CGM interpretation: -At today's visit, we reviewed her CGM downloads: It appears that 88.2% of values are in target range (goal >70%), while 11.8% are higher than 180 (goal <25%), and 0% are lower than 70 (goal <4%).  The calculated average blood sugar is 140.  -Reviewing the CGM trends, it appears that sugars remain  lower during the night and they increase after breakfast but not as appropriate as at last visit.  Sugars only occasionally increased about 180 after lunch and dinner, but they are overall slightly better than before.  At this visit, due to the lows at night, I advised her to decrease the [place] dose.  She does feel that the sugars are not as low as shown by the CGM, but her HbA1c is lower today 5.8% which is quite low. -I advised her to: Patient Instructions  Please continue: - Ozempic 2 mg weekly  Please decrease: - Tresiba 8 units daily  Please continue: - Levothyroxine 112 mcg daily  Take the thyroid hormone every day, with water, at least 30 minutes before breakfast, separated by at least 4 hours from: - acid reflux medications - calcium - iron - multivitamins  Please return in 4 months.  - advised to check sugars at different times of the day - 4x a day, rotating check times - advised for yearly eye exams >> she is UTD - return to clinic in 4 months  3. H/o Hypocalcemia -She has a history of hyperparathyroidism, now status post 3/4 parathyroidectomy -Calcium was normal a week ago: Lab Results  Component Value Date   CALCIUM 9.4 11/20/2020   PHOS 4.1 05/03/2017  -not on Tums -she prefers to take a calcium of the diet  4.  Vitamin D deficiency -She continues on 1000 units vitamin D daily -Vitamin D was at goal at last check in 03/2020, at 74.5 -We will continue the above dose and check today  Need refills LT4.  Component     Latest Ref Rng & Units 11/30/2020  TSH     0.450 - 4.500 uIU/mL 2.050  T4,Free(Direct)     0.82 - 1.77 ng/dL 1.57  Vitamin D, 25-Hydroxy     30.0 - 100.0 ng/mL 90.2  Labs are normal.  However, vitamin D level is at the upper limit of normal.  This is quite unusual if she is taking a fairly low dose of vitamin D.  I will advise her to stop the supplement for now.  We will recheck it at next visit.  Philemon Kingdom, MD PhD [facility]  Endocrinology

## 2020-11-30 ENCOUNTER — Other Ambulatory Visit: Payer: Self-pay | Admitting: Internal Medicine

## 2020-11-30 ENCOUNTER — Telehealth: Payer: Self-pay | Admitting: Internal Medicine

## 2020-11-30 MED ORDER — REPATHA 140 MG/ML ~~LOC~~ SOSY: 140.0000 mg | PREFILLED_SYRINGE | SUBCUTANEOUS | 0 refills | Status: DC

## 2020-11-30 NOTE — Telephone Encounter (Signed)
[employer] is going to replace faulty syringes but need a verbal order for a new rx.    Please call [phone]  Reference # [id number]

## 2020-11-30 NOTE — Telephone Encounter (Signed)
Called and spoke with representative from [employer].  Unable to give verbal order with pharmacist at this time.  Asked to send in e-Rx one time to replace faulty syringe.  Order placed for Repatha '140mg'$ /ml every 14 days #38m no refill.

## 2020-12-01 ENCOUNTER — Telehealth: Payer: Self-pay | Admitting: *Deleted

## 2020-12-01 LAB — T4, FREE: Free T4: 1.57 ng/dL (ref 0.82–1.77)

## 2020-12-01 LAB — VITAMIN D 25 HYDROXY (VIT D DEFICIENCY, FRACTURES): Vit D, 25-Hydroxy: 90.2 ng/mL (ref 30.0–100.0)

## 2020-12-01 LAB — TSH: TSH: 2.05 u[IU]/mL (ref 0.450–4.500)

## 2020-12-01 MED ORDER — LEVOTHYROXINE SODIUM 112 MCG PO TABS: 112.0000 ug | ORAL_TABLET | Freq: Every day | ORAL | 3 refills | Status: DC

## 2020-12-01 MED ORDER — REPATHA 140 MG/ML ~~LOC~~ SOSY: 1.0000 mL | PREFILLED_SYRINGE | SUBCUTANEOUS | 11 refills | Status: AC

## 2020-12-01 NOTE — Addendum Note (Signed)
Addended by: Philemon Kingdom on: 12/01/2020 12:00 PM   Modules accepted: Orders

## 2020-12-01 NOTE — Telephone Encounter (Signed)
Spoke to pt.  Discussed options below.  Pt reports that she has had [employer] replace 2 auto injectors for her.  Second one is on the way now.  Pt has 4 remaining faulty at home.  I notified pt that she may have 4 remaining replaced and pt voiced she definitely does NOT want to replace these, "it is not worth it to me, I am tired of calling to do this."  Pt did confirm that she has used Repatha auto injectors prior to this without difficulty and she is sure these are faulty vs needing further teaching to ensure using properly.  Pt requests to have new Rx sent for regular syringes moving forward.  Notified pt that I will send new Rx to [facility] in [place].  Pt confirms that since she takes insulin at home that she has no need for teaching for new administration technique.  Pt appreciative of help and has no further questions.

## 2020-12-01 NOTE — Telephone Encounter (Signed)
Dawn Garcia, Dawn Garcia  P Cv Div [facility] Triage (supporting [name] D, PA-C) 16 hours ago (4:09 PM)   I'm having a terrible time using this product. Keep getting the auto inject that doesn't fire. Total of at least 6 now. My husband can't push hard enough to make it trigger either. They say there is another way to order it where you use a regular syringe. Maybe I could get that. Call if you have questions. Thanks.    Attempted to call pt to discuss further.  No answer at this time. Lmtcb.  Pt did have one syringe replaced per subsequent telephone note.  Need to verify if remaining syringes had med wasted, or if still intact.   Spoke with [employer] (Repatha) rep. Options below: - If med is not wasted, pt may bring syringes in with her for nurse visit to give dose every 14 days. - Then after used all syringes at pt's home, we can send new Rx for regular syringes instead of autoinject if pt prefers  - Another option is if med has been wasted and pt has all syringes she may mail back to company and they will replace 4 of the 6. [employer] rep may also help with remaining 2 syringes.  - Per [employer] rep, pt will need to call [phone] and give case # [id number] to initiate replacement

## 2020-12-06 ENCOUNTER — Other Ambulatory Visit: Payer: Self-pay | Admitting: Internal Medicine

## 2020-12-07 ENCOUNTER — Telehealth: Payer: Self-pay | Admitting: Pulmonary Disease

## 2020-12-07 ENCOUNTER — Telehealth: Payer: Self-pay

## 2020-12-07 NOTE — Telephone Encounter (Signed)
Patient is aware of date/time of covid test and voiced her understanding.  Nothing further needed at this time.

## 2020-12-07 NOTE — Telephone Encounter (Signed)
Spoke to patient, who is requesting update on Nephrology referral and ONO. Order was placed for both on 11/24/2020.  Rodena Piety, can you help with this? Thanks

## 2020-12-07 NOTE — Telephone Encounter (Signed)
Lm for reminder of covid test prior to PFT.   [facility]

## 2020-12-07 NOTE — Telephone Encounter (Signed)
Received call from patient.  Patient stated that her spo2 has dropped as low as 46 % 116HR, however her hands were cold.  I have suggested that she warm her hands by rubbing them together or run luke warm water over her hands. She re checked and spo2 was 87% C/o lightlessness, dizziness, increased sob and chest discomfort.  She does not wear supplemental oxygen. I suggested ED via EMS for eval.  Patient voiced her understanding.  Routing to Dr. Mortimer Fries and Dr. Patsey Berthold as an [facility]ier. Dr. Patsey Berthold is unavailable.

## 2020-12-07 NOTE — Telephone Encounter (Signed)
Pt last saw [name], PA on 11/20/20, last labs 11/20/20 Creat 1.31, age 80, weight 80.7kg, based on specified criteria pt is on appropriate dosage of Eliquis 5mg  BID for afib.  Will refill rx.

## 2020-12-08 ENCOUNTER — Other Ambulatory Visit: Payer: Medicare Other

## 2020-12-08 NOTE — Telephone Encounter (Signed)
Lm for patient.  

## 2020-12-08 NOTE — Telephone Encounter (Signed)
The Nephrology order was sent to [facility] and I spoke with [facility] there on 12/01/20. She stated that they had left the patient a message and at that time had not returned their call.  I also spoke with Ashly with [employer] and the ONO was scheduled to be delivered today 12/08/20 but when the patient called into the office this morning she stated to [facility] PFT appt on 12/09/20 she was in the hospital. Ashly found out from his RT that she is in [facility].

## 2020-12-08 NOTE — Telephone Encounter (Signed)
Spoke to patient and relayed below message and voiced her understanding.  Patient is currently admitted at [facility]. Patient stated that she is scheduled with nephrology on 12/21/2020. She will contacted [facility] to reschedule ONO.  Rodena Piety, please reschedule PFT. Thanks

## 2020-12-09 ENCOUNTER — Telehealth: Payer: Self-pay | Admitting: Pulmonary Disease

## 2020-12-09 ENCOUNTER — Ambulatory Visit: Payer: Medicare Other

## 2020-12-09 NOTE — Telephone Encounter (Signed)
Noted  

## 2020-12-10 NOTE — Telephone Encounter (Signed)
Called and spoke to patient.  Patient stated that she was recently admitted at [facility]. Patient stated that she did not see cardiology or pulmonary during admission. Advised patient to contact Dr. Darnelle Bos office for OV.  Pending OV with Dr. Patsey Berthold on 01/28/2021. She would like for Dr. Patsey Berthold to review her records and ABG.  Please advise. thanks

## 2020-12-10 NOTE — Telephone Encounter (Signed)
Patient is returning phone call. Patient phone number is [phone].

## 2020-12-10 NOTE — Telephone Encounter (Signed)
The blood gases that were obtained at [facility] were venous meaning that they came from the vein not from the artery.  As such the low oxygen is not unusual due to the fact that it came from the venous side.  It does not seem like they were able to replicate the low oxygen levels.  If her heart rate and atrial fibrillation go awry this can falsely decrease the saturation registered by the oximeter because the oximeter works by reflecting light to the blood vessels and if that pulse is erratic and fast it may not have time to capture the proper saturation.  Atrial fibrillation at a rapid response of the heart causes shortness of breath because the heart does not have enough time to fill.  We will continue to work with cardiology to see if we can sort out what is causing all of these issues.  I do still want her to have an overnight oximetry on her CPAP to make sure that she does not need additional supplemental oxygen with her CPAP.

## 2020-12-10 NOTE — Telephone Encounter (Signed)
Lm for patient.  

## 2020-12-10 NOTE — Telephone Encounter (Signed)
I did get her records from [facility] and will review.

## 2020-12-11 NOTE — Telephone Encounter (Signed)
Patient is aware of below message and voiced her understanding. She will contact [employer] to reschedule ONO.  Nothing further needed.

## 2020-12-16 ENCOUNTER — Encounter: Payer: Self-pay | Admitting: Internal Medicine

## 2020-12-16 ENCOUNTER — Encounter: Payer: Self-pay | Admitting: Pulmonary Disease

## 2020-12-18 ENCOUNTER — Telehealth: Payer: Self-pay

## 2020-12-18 ENCOUNTER — Ambulatory Visit (INDEPENDENT_AMBULATORY_CARE_PROVIDER_SITE_OTHER): Payer: Medicare Other

## 2020-12-18 ENCOUNTER — Other Ambulatory Visit: Payer: Self-pay

## 2020-12-18 DIAGNOSIS — I495 Sick sinus syndrome: Secondary | ICD-10-CM | POA: Diagnosis not present

## 2020-12-18 MED ORDER — OZEMPIC (2 MG/DOSE) 8 MG/3ML ~~LOC~~ SOPN: 2.0000 mg | PEN_INJECTOR | SUBCUTANEOUS | 3 refills | Status: DC

## 2020-12-18 NOTE — Telephone Encounter (Signed)
Called and spoke to patient about upcoming Covid test, nothing further needed.

## 2020-12-22 ENCOUNTER — Other Ambulatory Visit: Payer: Self-pay

## 2020-12-22 LAB — CUP PACEART REMOTE DEVICE CHECK
Battery Remaining Longevity: 58 mo
Battery Remaining Percentage: 51 %
Battery Voltage: 2.99 V
Brady Statistic AP VP Percent: 51 %
Brady Statistic AP VS Percent: 20 %
Brady Statistic AS VP Percent: 2.7 %
Brady Statistic AS VS Percent: 27 %
Brady Statistic RA Percent Paced: 71 %
Brady Statistic RV Percent Paced: 54 %
Date Time Interrogation Session: 20221007040012
Implantable Lead Implant Date: 20170829
Implantable Lead Implant Date: 20170829
Implantable Lead Location: 753859
Implantable Lead Location: 753860
Implantable Pulse Generator Implant Date: 20170829
Lead Channel Impedance Value: 430 Ohm
Lead Channel Impedance Value: 550 Ohm
Lead Channel Pacing Threshold Amplitude: 0.75 V
Lead Channel Pacing Threshold Amplitude: 0.875 V
Lead Channel Pacing Threshold Pulse Width: 0.4 ms
Lead Channel Pacing Threshold Pulse Width: 0.6 ms
Lead Channel Sensing Intrinsic Amplitude: 12 mV
Lead Channel Sensing Intrinsic Amplitude: 3.1 mV
Lead Channel Setting Pacing Amplitude: 1.875
Lead Channel Setting Pacing Amplitude: 2.5 V
Lead Channel Setting Pacing Pulse Width: 0.4 ms
Lead Channel Setting Sensing Sensitivity: 2 mV
Pulse Gen Model: 2272
Pulse Gen Serial Number: 7945290

## 2020-12-22 MED ORDER — OZEMPIC (2 MG/DOSE) 8 MG/3ML ~~LOC~~ SOPN: 2.0000 mg | PEN_INJECTOR | SUBCUTANEOUS | 3 refills | Status: DC

## 2020-12-23 ENCOUNTER — Other Ambulatory Visit
Admission: RE | Admit: 2020-12-23 | Discharge: 2020-12-23 | Disposition: A | Discharge status: Home / Self Care | Payer: Medicare Other | Source: Ambulatory Visit | Attending: Pulmonary Disease | Admitting: Pulmonary Disease

## 2020-12-23 ENCOUNTER — Other Ambulatory Visit: Payer: Self-pay

## 2020-12-23 DIAGNOSIS — Z20822 Contact with and (suspected) exposure to covid-19: Secondary | ICD-10-CM | POA: Insufficient documentation

## 2020-12-23 DIAGNOSIS — Z01812 Encounter for preprocedural laboratory examination: Secondary | ICD-10-CM | POA: Insufficient documentation

## 2020-12-23 LAB — SARS CORONAVIRUS 2 (TAT 6-24 HRS): SARS Coronavirus 2: NEGATIVE

## 2020-12-24 ENCOUNTER — Encounter: Payer: Self-pay | Admitting: Pulmonary Disease

## 2020-12-24 ENCOUNTER — Ambulatory Visit: Payer: Medicare Other | Attending: Pulmonary Disease

## 2020-12-24 DIAGNOSIS — R0602 Shortness of breath: Secondary | ICD-10-CM | POA: Insufficient documentation

## 2020-12-24 MED ORDER — ALBUTEROL SULFATE (2.5 MG/3ML) 0.083% IN NEBU
2.5000 mg | INHALATION_SOLUTION | Freq: Once | RESPIRATORY_TRACT | Status: AC
  Administered 2020-12-24: 2.5 mg via RESPIRATORY_TRACT
  Filled 2020-12-24: qty 3

## 2020-12-24 MED ORDER — ALBUTEROL SULFATE (2.5 MG/3ML) 0.083% IN NEBU
2.5000 mg | INHALATION_SOLUTION | Freq: Once | RESPIRATORY_TRACT | Status: AC
  Filled 2020-12-24: qty 3

## 2020-12-25 NOTE — Telephone Encounter (Signed)
I saw the blood gases at [facility].  Those were VENOUS, they really do not enlighten me in any way.  She was having difficulties at that time.  Her mild PAH is due to her cardiac issues and there is nothing to do for that except managing her cardiac issues in particular need to take a closer look at her mitral valve which has been recommended to her cardiologists.  She also needs to continue using her CPAP for OSA as we can see mild pulmonary hypertension with that.  We had wanted to have a oximetry overnight on her CPAP to make sure she was getting enough oxygen.  I cannot recall if she had this done if not it needs to be ordered.  I do not think that her episodes with chest discomfort fatigue and shortness of breath are due to her mild PAH.  It would have to be extremely severe for her to note those issues.  Again she has other cardiac issues that explain these problems.

## 2020-12-25 NOTE — Telephone Encounter (Signed)
Dr. Gonzalez please advise. Thanks 

## 2020-12-28 NOTE — Progress Notes (Signed)
Remote pacemaker transmission.   

## 2020-12-29 NOTE — Telephone Encounter (Signed)
Recall placed closing encounter

## 2021-01-12 ENCOUNTER — Ambulatory Visit: Payer: Medicare Other | Admitting: Cardiology

## 2021-01-21 ENCOUNTER — Telehealth: Payer: Self-pay | Admitting: Internal Medicine

## 2021-01-21 NOTE — Telephone Encounter (Signed)
   [facility] Pre-operative Risk Assessment    Patient Name: Dawn Garcia [facility]  DOB: April 24, 1940 MRN: [id number]  HEARTCARE STAFF:  - IMPORTANT!!!!!! Under Visit Info/Reason for Call, type in Other and utilize the format Clearance MM/DD/YY or Clearance TBD. Do not use dashes or single digits. - Please review there is not already an duplicate clearance open for this procedure. - If request is for dental extraction, please clarify the # of teeth to be extracted. - If the patient is currently at the dentist's office, call Pre-Op Callback Staff (MA/nurse) to input urgent request.  - If the patient is not currently in the dentist office, please route to the Pre-Op pool.  Request for surgical clearance:  What type of surgery is being performed? Colonoscopy   When is this surgery scheduled? 04/19/21  What type of clearance is required (medical clearance vs. Pharmacy clearance to hold med vs. Both)? both  Are there any medications that need to be held prior to surgery and how long? Discontinue Eliquis preoperatively   Practice name and name of physician performing surgery? [facility]   What is the office phone number? [phone]   7.   What is the office fax number? [phone]  8.   Anesthesia type (None, local, MAC, general) ? Not listed    Ace Gins 01/21/2021, 4:36 PM  _________________________________________________________________   (provider comments below)

## 2021-01-25 ENCOUNTER — Encounter: Payer: Self-pay | Admitting: Pulmonary Disease

## 2021-01-25 NOTE — Telephone Encounter (Signed)
   Patient Name: Dawn Garcia  DOB: 17-Oct-1940 MRN: [id number]  Primary Cardiologist: Nelva Bush, MD  Chart reviewed as part of pre-operative protocol coverage. The patient has an upcoming appointment scheduled 02/18/21 at which time this clearance can be addressed in case there are any issues that would impact surgical clearance. I added preop FYI to appointment notes so that provider is aware to address at time of OV.   PharmD input is also pending which will be available to MD at time of OV to make final recommendations. This clearance is not final until time of office visit.  Per office protocol, the cardiology provider should forward their finalized clearance decision to requesting party below. I will route this message as FYI to requesting party and remove this message from the pre-op box as separate preop APP input not needed at this time.  Please call with questions.  Charlie Pitter, PA-C 01/25/2021, 10:09 AM

## 2021-01-27 NOTE — Telephone Encounter (Signed)
Patient with diagnosis of A Fib on Eliquis for anticoagulation.    Procedure: colonoscopy Date of procedure: 04/19/21   CHA2DS2-VASc Score = 7  This indicates a 11.2% annual risk of stroke. The patient's score is based upon: CHF History: 1 HTN History: 1 Diabetes History: 1 Stroke History: 0 Vascular Disease History: 1 Age Score: 2 Gender Score: 1    CrCl 52 mLmin Platelet count 210K   Per office protocol, patient can hold Eliquis for 1 days prior to procedure.

## 2021-01-28 ENCOUNTER — Telehealth: Payer: Self-pay | Admitting: Pulmonary Disease

## 2021-01-28 ENCOUNTER — Other Ambulatory Visit: Payer: Self-pay

## 2021-01-28 ENCOUNTER — Encounter: Payer: Self-pay | Admitting: Pulmonary Disease

## 2021-01-28 ENCOUNTER — Ambulatory Visit: Payer: Medicare Other | Admitting: Pulmonary Disease

## 2021-01-28 VITALS — BP 120/82 | HR 70 | Temp 97.2°F | Ht 62.0 in | Wt 183.4 lb

## 2021-01-28 DIAGNOSIS — I5032 Chronic diastolic (congestive) heart failure: Secondary | ICD-10-CM | POA: Diagnosis not present

## 2021-01-28 DIAGNOSIS — N1832 Chronic kidney disease, stage 3b: Secondary | ICD-10-CM

## 2021-01-28 DIAGNOSIS — R0602 Shortness of breath: Secondary | ICD-10-CM

## 2021-01-28 DIAGNOSIS — G4733 Obstructive sleep apnea (adult) (pediatric): Secondary | ICD-10-CM | POA: Diagnosis not present

## 2021-01-28 DIAGNOSIS — I340 Nonrheumatic mitral (valve) insufficiency: Secondary | ICD-10-CM

## 2021-01-28 NOTE — Patient Instructions (Addendum)
At present I do not feel that you need referral to [facility]pertension clinic.  They specialize mostly on issues with connective tissue disease and with chronic thromboembolic disease and cases that are very severe.  Your pulmonary hypertension is very mild and its likely related to your issues with cardiac dysfunction given the results from the cardiac catheterization that Dr. Saunders Revel performed.  Management of pulmonary hypertension in this situation is with management of the left heart disease.  This also will require close follow-up of the mitral valve to determine if it needs intervention in the future.  Your overnight oximetry on CPAP was performed by [facility] however they never provided [place] with the results we are requesting these as soon as we have them we will let you know.  We will set you up with our sleep specialist Dr. Chauncey Cruel we will see you in follow-up in 2 to 3 months time call sooner should any new problems arise.

## 2021-01-28 NOTE — Telephone Encounter (Signed)
Contacted [facility] and requested ONO.  Will hold message in triage to ensure f/u.

## 2021-01-28 NOTE — Progress Notes (Signed)
Subjective:    Patient ID: Dawn Garcia [facility], female    DOB: [birth year], 80 y.o.   MRN: [id number] Chief Complaint  Patient presents with   Follow-up    Pt states no concerns    HPI Patient is a 80 year old very remote former smoker (minimal first hand exposure) with significant secondhand exposure who presents for follow-up on the issue of shortness of breath with exertion.  This is a scheduled appointment.  She was initially evaluated on 24 November 2020.  After that visit she was seen on 26 September at emergency room at [facility].  She reportedly had low oxygen saturations by oximetry.  At that time however she noted that desaturations occurred with tachycardia.  She does have issues with atrial fibrillation and suspect the oximeter could not pick up correctly.  Her evaluation in the ED was benign and she never noticed another episode after that.  She states that since her initial visit here her shortness of breath is markedly improved.  This coincides also with changes made to her cardiac medications by Dr. Saunders Revel.  She has obstructive sleep apnea and is on CPAP.  We had requested an overnight oximetry through [place], we had not received results but have called to request.  He had pulmonary function testing performed on 24 December 2020 which was entirely normal.  Right heart cath from July showed very mildly elevated pulmonary artery and right heart pressures in the setting of volume overload.  Patient also has issues with mitral regurgitation.  She was referred to nephrology and she has been evaluated by her [place].  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  Today she was requesting a referral to the pulmonary hypertension clinic at [facility].  I did explain to her that her issues are very mild and likely related to her cardiac issues and at present there is nothing that [facility] is going to  be doing differently than what is being done at present.  She does not require specialized  medications for control of her very mild pulmonary hypertension.  Management of this will include management of her cardiac disease and continued use of CPAP for her sleep apnea.  Compliance download from her CPAP machine is excellent.   Review of Systems A 10 point review of systems was performed and it is as noted above otherwise negative.  Patient Active Problem List   Diagnosis Date Noted   Dyspnea    LADA (latent autoimmune diabetes in adults), managed as type 2 ([place]) 07/30/2020   Secondary hypercoagulable state ([place]) 06/30/2020   Persistent atrial fibrillation ([place])    Cardiac pacemaker in situ 10/09/2018   Chest pain 04/28/2017   SSS (sick sinus syndrome) ([place]) 04/28/2017   Uncontrolled type 2 diabetes mellitus with hyperglycemia, without long-term current use of insulin ([place]) 04/28/2017   OSA (obstructive sleep apnea) 04/28/2017   Lipoma of neck 09/05/2016   Osteoarthritis involving multiple joints on both sides of body 09/05/2016   Bunion of great toe 09/05/2016   Hyperlipidemia 09/18/2015   Symptomatic bradycardia 09/18/2015   Coronary artery disease 09/10/2015   B12 deficiency 09/10/2015   History of melanoma 09/09/2015   COPD (chronic obstructive pulmonary disease) ([place]) 09/09/2015   Hypothyroidism following radioiodine therapy 09/09/2015   Obesity, Class II, BMI 35-39.9 08/20/2015   GERD with stricture 08/20/2015   Macular degeneration 00/86/7619   Diastolic CHF ([place]) 50/93/2671   History of parathyroidectomy 08/20/2015   CKD (chronic kidney disease) stage 3, GFR 30-59 ml/min (HCC)  08/20/2015   Paroxysmal atrial fibrillation ([place]) 08/19/2015   Poorly controlled type 2 diabetes mellitus with circulatory disorder ([place]) 08/19/2015   Primary gout 08/19/2015   Social History   Tobacco Use   Smoking status: Former    Packs/day: 2.00    Years: 3.00    Pack years: 6.00    Types: Cigarettes    Quit date: 09/25/1968    Years since quitting: 52.3   Smokeless tobacco:  Never   Tobacco comments:    10/05/2017 "smoked in my 20's"  Substance Use Topics   Alcohol use: Not Currently    Comment: 10/05/2017 "1 mixed drink q 2-3 months"   Allergies  Allergen Reactions   Demerol [Meperidine] Nausea And Vomiting    Tolerated Fentanyl 11/10/15   Epinephrine Shortness Of Breath and Palpitations    Powder   Multaq [Dronedarone] Other (See Comments)    Kidney failure and lung problems   Sulfa Antibiotics Anaphylaxis   Tetracyclines & Related Swelling and Other (See Comments)    Made nose, lips,  And tongue itchy   Quinolones Other (See Comments)    Aortic dilation. Avoid FQ, as these are showing to increase Ao dilation.    Statins     Foggy head, episodic amnesia    Imdur [Isosorbide Nitrate]     HA   Current Meds  Medication Sig   acetaminophen (TYLENOL) 650 MG CR tablet Take 650 mg by mouth at bedtime.   amLODipine (NORVASC) 5 MG tablet Take 0.5 tablets (2.5 mg total) by mouth daily.   Ascorbic Acid (VITAMIN C PO) Take 500 mg by mouth daily.   azithromycin (ZITHROMAX) 250 MG tablet Take 250 mg by mouth as directed.   carboxymethylcellulose (REFRESH PLUS) 0.5 % SOLN Place 1 drop into both eyes daily as needed (dry eyes).   Cholecalciferol (VITAMIN D3) 5000 units TABS Take 5,000 Units by mouth every morning.   Continuous Blood Gluc Receiver (DEXCOM G6 RECEIVER) DEVI Use as instructed to check blood sugar daily.   Continuous Blood Gluc Sensor (DEXCOM G6 SENSOR) MISC Use as instructed to check blood sugar daily   Continuous Blood Gluc Transmit (DEXCOM G6 TRANSMITTER) MISC Use as instructed to check blood sugar.   diltiazem (CARDIZEM) 30 MG tablet Take 1 tablet every 4 hours AS NEEDED for heart rate >100   docusate sodium (COLACE) 100 MG capsule Take 100 mg by mouth daily.   ELIQUIS 5 MG TABS tablet TAKE 1 TABLET(5 MG) BY MOUTH TWICE DAILY   fenofibrate 54 MG tablet Take 54 mg by mouth daily.   glucose blood test strip Use as instructed to check blood sugar 2  times daily   insulin degludec (TRESIBA FLEXTOUCH) 100 UNIT/ML FlexTouch Pen Inject 8-10 Units into the skin daily.   Insulin Pen Needle (PEN NEEDLES) 32G X 4 MM MISC Use to inject into the skin daily   Insulin Syringe-Needle U-100 (INSULIN SYRINGE .3CC/31GX5/16") 31G X 5/16" 0.3 ML MISC Use to inject insulin twice daily   levothyroxine (SYNTHROID) 112 MCG tablet Take 1 tablet (112 mcg total) by mouth daily.   Multiple Vitamins-Minerals (ICAPS AREDS 2) CAPS Take 1 capsule by mouth 2 (two) times daily.   pantoprazole (PROTONIX) 40 MG tablet Take 1 tablet (40 mg total) by mouth daily.   polyethylene glycol (MIRALAX / GLYCOLAX) 17 g packet Take 17 g by mouth daily as needed for moderate constipation.   potassium chloride (KLOR-CON) 10 MEQ tablet Take 1.5 tablets (15 mEq total) by mouth daily.  Semaglutide, 2 MG/DOSE, (OZEMPIC, 2 MG/DOSE,) 8 MG/3ML SOPN Inject 2 mg into the skin once a week.   torsemide (DEMADEX) 20 MG tablet Take 1.5 tablets (30 mg total) by mouth daily.   vitamin B-12 (CYANOCOBALAMIN) 500 MCG tablet Take 500 mcg by mouth daily.   Immunization History  Administered Date(s) Administered   Fluad Quad(high Dose 65+) 12/27/2018   Influenza, High Dose Seasonal PF 11/29/2016   Influenza,inj,Quad PF,6+ Mos 12/08/2015   Influenza-Unspecified 12/08/2015, 11/12/2017   Pneumococcal Polysaccharide-23 08/19/2015        Objective:   Physical Exam BP 120/82 (BP Location: Left Arm, Patient Position: Sitting, Cuff Size: Normal)   Pulse 70   Temp (!) 97.2 F (36.2 C)   Ht 5\' 2"  (1.575 m)   Wt 183 lb 6.4 oz (83.2 kg)   SpO2 99%   BMI 33.54 kg/m   GENERAL: Overweight woman, chronically ill-appearing, no acute distress.  No conversational dyspnea. HEAD: Normocephalic, atraumatic.  EYES: Pupils equal, round, reactive to light.  No scleral icterus.  MOUTH: Nose/mouth/throat not examined due to masking requirements for COVID 19. NECK: Supple. No thyromegaly. Trachea midline. No JVD.   No adenopathy. PULMONARY: Good air entry bilaterally.  No adventitious sounds. CARDIOVASCULAR: S1 and S2. Regular rate and rhythm.  Grade 2/6 systolic ejection murmur consistent with mitral regurgitation. ABDOMEN: Protuberant, soft, otherwise benign. MUSCULOSKELETAL: No joint deformity, no clubbing, no edema.  NEUROLOGIC: No overt focal deficit.  Awake and alert, speech fluent. SKIN: Intact,warm,dry. PSYCH: Mood and behavior normal.       Assessment & Plan:     ICD-10-CM   1. Shortness of breath  R06.02     2. OSA (obstructive sleep apnea)  G47.33    Compliant with CPAP Obtained download from [place] No need for supplemental oxygen with CPAP    3. Stage 3b chronic kidney disease (HCC)  K16.01    Follows with Dr. Holley Raring This issue adds complexity to her management     4. Chronic diastolic (congestive) heart failure (HCC)  I50.32    Likely the etiology for her very mild pulmonary hypertension    5. Moderate mitral regurgitation  I34.0    Adds to the etiology of her very mild pulmonary hypertension     Finally received the results from [place] with regards to the patient's overnight oximetry on CPAP.  She has excellent oxygen saturations throughout the night.  No need for supplemental oxygen with CPAP.  We will set her up with our sleep medicine specialist.  She is to call sooner should any new difficulties arise.  Renold Don, MD Advanced Bronchoscopy PCCM [facility]    *This note was dictated using voice recognition software/Dragon.  Despite best efforts to proofread, errors can occur which can change the meaning.  Any change was purely unintentional.

## 2021-01-29 ENCOUNTER — Encounter: Payer: Self-pay | Admitting: Pulmonary Disease

## 2021-01-29 NOTE — Telephone Encounter (Signed)
I called and spoke with the pt and notified of results  She verbalized understanding  Nothing further needed

## 2021-01-29 NOTE — Telephone Encounter (Signed)
ONO not received. Spoke to Terri with [employer] and requested report.

## 2021-01-29 NOTE — Telephone Encounter (Signed)
Called and LVM for patient in regards ONO results Per Dr Patsey Berthold Patient O2 is controlled well with cpap no need for O2 at this time.

## 2021-02-12 ENCOUNTER — Encounter: Payer: Self-pay | Admitting: Internal Medicine

## 2021-02-16 ENCOUNTER — Encounter: Payer: Self-pay | Admitting: Internal Medicine

## 2021-02-18 ENCOUNTER — Ambulatory Visit: Payer: Medicare Other | Admitting: Internal Medicine

## 2021-02-18 ENCOUNTER — Telehealth: Payer: Self-pay | Admitting: Internal Medicine

## 2021-02-18 DIAGNOSIS — E876 Hypokalemia: Secondary | ICD-10-CM

## 2021-02-18 MED ORDER — POTASSIUM CHLORIDE ER 10 MEQ PO TBCR: 15.0000 meq | EXTENDED_RELEASE_TABLET | Freq: Every day | ORAL | 0 refills | Status: DC

## 2021-02-18 NOTE — Telephone Encounter (Signed)
*  STAT* If patient is at the pharmacy, call can be transferred to refill team.   1. Which medications need to be refilled? (please list name of each medication and dose if known) potassium 15 mEq po q D   2. Which pharmacy/location (including street and city if local pharmacy) is medication to be sent to? [place]   3. Do they need a 30 day or 90 day supply? [place]

## 2021-02-18 NOTE — Telephone Encounter (Signed)
Requested Prescriptions   Signed Prescriptions Disp Refills   potassium chloride (KLOR-CON) 10 MEQ tablet 135 tablet 0    Sig: Take 1.5 tablets (15 mEq total) by mouth daily.    Authorizing Provider: END, CHRISTOPHER    Ordering User: Raelene Bott, Oree Mirelez L

## 2021-02-19 ENCOUNTER — Other Ambulatory Visit: Payer: Self-pay

## 2021-02-19 DIAGNOSIS — E876 Hypokalemia: Secondary | ICD-10-CM

## 2021-02-19 NOTE — Telephone Encounter (Signed)
*  STAT* If patient is at the pharmacy, call can be transferred to refill team.   1. Which medications need to be refilled? (please list name of each medication and dose if known) Potassium  2. Which pharmacy/location (including street and city if local pharmacy) is medication to be sent to? [facility]  3. Do they need a 30 day or 90 day supply? [place]

## 2021-03-16 ENCOUNTER — Encounter: Payer: Self-pay | Admitting: Internal Medicine

## 2021-03-16 DIAGNOSIS — E1159 Type 2 diabetes mellitus with other circulatory complications: Secondary | ICD-10-CM

## 2021-03-16 DIAGNOSIS — E1165 Type 2 diabetes mellitus with hyperglycemia: Secondary | ICD-10-CM

## 2021-03-17 ENCOUNTER — Other Ambulatory Visit: Payer: Self-pay | Admitting: Internal Medicine

## 2021-03-17 DIAGNOSIS — E1121 Type 2 diabetes mellitus with diabetic nephropathy: Secondary | ICD-10-CM

## 2021-03-17 MED ORDER — OZEMPIC (2 MG/DOSE) 8 MG/3ML ~~LOC~~ SOPN: 2.0000 mg | PEN_INJECTOR | SUBCUTANEOUS | 3 refills | Status: AC

## 2021-03-19 ENCOUNTER — Ambulatory Visit (INDEPENDENT_AMBULATORY_CARE_PROVIDER_SITE_OTHER): Payer: Medicare Other

## 2021-03-19 DIAGNOSIS — I495 Sick sinus syndrome: Secondary | ICD-10-CM | POA: Diagnosis not present

## 2021-03-21 LAB — CUP PACEART REMOTE DEVICE CHECK
Battery Remaining Longevity: 55 mo
Battery Remaining Percentage: 48 %
Battery Voltage: 2.99 V
Brady Statistic AP VP Percent: 47 %
Brady Statistic AP VS Percent: 21 %
Brady Statistic AS VP Percent: 2.6 %
Brady Statistic AS VS Percent: 29 %
Brady Statistic RA Percent Paced: 68 %
Brady Statistic RV Percent Paced: 50 %
Date Time Interrogation Session: 20230106041615
Implantable Lead Implant Date: 20170829
Implantable Lead Implant Date: 20170829
Implantable Lead Location: 753859
Implantable Lead Location: 753860
Implantable Pulse Generator Implant Date: 20170829
Lead Channel Impedance Value: 430 Ohm
Lead Channel Impedance Value: 600 Ohm
Lead Channel Pacing Threshold Amplitude: 0.75 V
Lead Channel Pacing Threshold Amplitude: 1 V
Lead Channel Pacing Threshold Pulse Width: 0.4 ms
Lead Channel Pacing Threshold Pulse Width: 0.6 ms
Lead Channel Sensing Intrinsic Amplitude: 12 mV
Lead Channel Sensing Intrinsic Amplitude: 2.7 mV
Lead Channel Setting Pacing Amplitude: 2 V
Lead Channel Setting Pacing Amplitude: 2.5 V
Lead Channel Setting Pacing Pulse Width: 0.4 ms
Lead Channel Setting Sensing Sensitivity: 2 mV
Pulse Gen Model: 2272
Pulse Gen Serial Number: 7945290

## 2021-03-29 ENCOUNTER — Other Ambulatory Visit: Payer: Self-pay | Admitting: *Deleted

## 2021-03-29 MED ORDER — LOSARTAN POTASSIUM 50 MG PO TABS: 50.0000 mg | ORAL_TABLET | Freq: Every day | ORAL | 0 refills | Status: DC

## 2021-03-30 ENCOUNTER — Ambulatory Visit: Payer: Medicare Other | Admitting: Pulmonary Disease

## 2021-03-30 NOTE — Progress Notes (Signed)
Remote pacemaker transmission.   

## 2021-04-02 ENCOUNTER — Ambulatory Visit: Payer: Medicare Other | Admitting: Internal Medicine

## 2021-04-06 ENCOUNTER — Ambulatory Visit: Payer: Medicare Other | Admitting: Pulmonary Disease

## 2021-04-12 NOTE — Telephone Encounter (Signed)
I s/w the Dawn Garcia and she has been scheduled to see Cadence Kathlen Mody, [facility] 04/14/21 8:50 for pre op clearance. Dawn Garcia is grateful for the call and the help. I will forward notes to [facility] for upcoming appt. Will send FYI to requesting office the Dawn Garcia has appt 04/14/21.

## 2021-04-12 NOTE — Telephone Encounter (Signed)
Preoperative team, patient has preoperative appointment 04/30/2021.  Please contact requesting office and verify that they have been contacted. Thank you,  Jossie Ng. Alyxander Kollmann NP-C    04/12/2021, 4:25 PM [place] [address] Office [phone] Fax [phone]

## 2021-04-12 NOTE — Telephone Encounter (Signed)
[facility] checking on status of clearance

## 2021-04-13 ENCOUNTER — Telehealth: Payer: Self-pay | Admitting: Internal Medicine

## 2021-04-13 NOTE — Telephone Encounter (Signed)
° °  Primary Cardiologist: Nelva Bush, MD  Chart reviewed as part of pre-operative protocol coverage. Simple dental extractions are considered low risk procedures per guidelines and generally do not require any specific cardiac clearance. It is also generally accepted that for simple extractions and dental cleanings, there is no need to interrupt blood thinner therapy.   SBE prophylaxis is not required for the patient.  I will route this recommendation to the requesting party via Epic fax function and remove from pre-op pool.  Please call with questions.  Deberah Pelton, NP 04/13/2021, 8:34 AM

## 2021-04-13 NOTE — Telephone Encounter (Addendum)
° °  Pre-operative Risk Assessment    Patient Name: Dawn Garcia [facility]  DOB: 03-18-1940 MRN: [id number]     Request for Surgical Clearance    Procedure:  Dental Extraction - Amount of Teeth to be Pulled:  2 extractions 1 today and then 1 tomorrow   Date of Surgery:  Clearance 04/13/21                                 Surgeon:  Kennis Carina  Surgeon's Group or Practice Name: [facility]  Phone number:  [phone] office [phone] nurse cel  Fax number:  [phone]   Type of Clearance Requested:   - Pharmacy:  Hold Apixaban (Eliquis) please advise    Type of Anesthesia:   septicane with epinephrine    Additional requests/questions:    Jonathon Jordan   04/13/2021, 8:19 AM

## 2021-04-13 NOTE — Progress Notes (Signed)
Cardiology Office Note:    Date:  04/14/2021   ID:  Dawn Garcia, DOB 11-Aug-1940, MRN [id number]  PCP:  Perrin Maltese, MD  [facility] Cardiologist:  Nelva Bush, MD  [facility] Electrophysiologist:  None   Referring MD: Perrin Maltese, MD   Chief Complaint: Pre-op clearance  History of Present Illness:    Dawn Garcia is a 81 y.o. female with a hx of with history of CAD with remote stent and patent stent by catheterization in 2014, paroxysmal atrial fibrillation s/p reported ablation in 2013 and repeat atrial fibrillation ablation 1/75/1025, chronic diastolic CHF, COPD, CKD, DM2, orthostatic hypotension, hyperlipidemia, hypothyroidism, reported history of bradycardia / 5-second pauses on cardiac monitoring with dual-chamber 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 for sick sinus syndrome/tachybradycardia syndrome and RA lead revision due to dislodgment, OSA, and here today for pre-op evaluation.   She has a long history of atrial fibrillation with ablation in [place] in 2013 and repeat ablation by Dr. Curt Bears in 2019.  She was on amiodarone in the past but reported this was discontinued due to development of lung issues.  She did not tolerate Multaq.     She underwent DCCV 06/22/2020.   The device clinic received an alert for increased atrial fibrillation burden 06/26/2020.   When seen 06/30/2020, she was back in sinus rhythm.  Lasix was prescribed for wt gain and abdominal bloating.   She has been maintained on anticoagulation with Eliquis 5 mg twice daily and denies any bleeding issues on anticoagulation.  Echo 07/2020 showed EF 50 to 55%, mild LVH, G2 DD, elevated left atrial pressure, mild LAE, mild to moderate MR with degenerative mitral valve, moderate calcification of the aortic valve with trivial AR, mild dilation of the aorta at 37 mm, mildly dilated pulmonary artery.  In 08/2020 patient reported CP and DOE. Myoview showed small defect likely artifact vs prior MI, EF 52%, low risk  study. She had persistent symptoms and was set up for R/L heart cath. Was also referred to a sleep apnea clinic. Cath showed severe 1v CAD with 70%s of small, bifurcating D2 similar to 2019 cath. Mild to moderate nonobstructive CAD in LAD appeared similar. She has patent Lcx and OM1 with 30-40% pISR, new from 2019. LH filling pressures were upper limits of nl. She had mild elevation of pulmonary pressures and RH pressures with mildly reduced CO/CI. Systemic HTN with SBP greater than 243mmHg noted during cath. Torsemide increased to 20mg  daily, she was started on low dose amlodipine. Medical therapy was recommended for D2 branch, given too small for PCI.   Last seen 11/20/20 and reported improvement of symptoms.  Today, patient presents for pre-op cardiac evaluation for colonoscopy, scheduled for Monday 2/6. Per pharmacy OK to hold Eliquis 1 day prior to procedure. From a cardiac perspective she is doing the same. She has some mild chest discomfort, can be at rest or with activty. This is chronic and unchanged. Sees pulmonology for mild pulmonary HTN, not currently on any treatment. No LLE, she has chronic orthopnea, no pnd. She uses CPAP at night. Overall symptoms are stable and unchanged.   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Dawn Garcia)    Arthritis    "all over; particularly in hands/fingers; all my joints" (10/05/2017)   Atrial fibrillation (HCC)    CAD (coronary artery disease)    CHF (congestive heart failure) (HCC)     A-Fib  Chronic kidney disease    COPD (chronic obstructive pulmonary disease) (HCC)    "very mild" (10/05/2017)   Family history of adverse reaction to anesthesia    "daughter w/PONV & woke up during endoscopy" (10/05/2017)   GERD (gastroesophageal reflux disease)    Gout    Graves' disease    S/P "radioactive tx"   Heart disease    History of blood transfusion    "related to  shoulder replacement"    History of gout    "no longer on daily RX" (10/05/2017)   History of hiatal hernia    Hyperlipidemia    Hypertension    Hypocalcemia    Hypokalemia    Hypothyroidism    Macular degeneration    Melanoma ([place]) ~ 2012   "off my back" (10/05/2017)   Myocardial infarction [facility])    "was told I'd had one; don't really know when" (10/05/2017)   Orthostatic hypotension 2020   OSA on CPAP    Pacemaker    Presence of permanent cardiac 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   Renal disorder    Scarring of lung 04/2017   "found by radiology" (10/05/2017)   Sinus node dysfunction (HCC)    Type II diabetes mellitus ([place])    Vitamin B12 deficiency    Vitamin D deficiency     Past Surgical History:  Procedure Laterality Date   APPENDECTOMY     ATRIAL FIBRILLATION ABLATION N/A 10/05/2017   Procedure: ATRIAL FIBRILLATION ABLATION;  Surgeon: Constance Haw, MD;  Location: [place] [facility];  Service: Cardiovascular;  Laterality: N/A;   ATRIAL FIBRILLATION ABLATION  ~ 2014   CARDIAC CATHETERIZATION  04/2017   CARDIOVERSION N/A 06/22/2020   Procedure: CARDIOVERSION;  Surgeon: Nelva Bush, MD;  Location: [facility] ORS;  Service: Cardiovascular;  Laterality: N/A;   COLONOSCOPY W/ BIOPSIES AND POLYPECTOMY  2015   1 polyp removed- repeat 3 years   CORONARY ANGIOPLASTY WITH STENT PLACEMENT  ~ 2014   EP IMPLANTABLE DEVICE N/A 11/10/2015   Procedure: Pacemaker Implant;  Surgeon: Will [name], MD;  Location: [place] CV [facility];  Service: Cardiovascular;  Laterality: N/A;   EP IMPLANTABLE DEVICE N/A 11/11/2015   Procedure: Lead Revision/Repair;  Surgeon: Evans Lance, MD;  Location: [place] [facility];  Service: Cardiovascular;  Laterality: N/A;   INGUINAL HERNIA REPAIR Left 1951   JOINT REPLACEMENT     LAPAROSCOPIC CHOLECYSTECTOMY     MELANOMA EXCISION  ~ 2012   "off my back" (10/05/2017)   PARATHYROIDECTOMY     "removed 3 glands; still have 1 gland" (10/05/2017)   RIGHT/LEFT HEART CATH  AND CORONARY ANGIOGRAPHY N/A 05/03/2017   Procedure: RIGHT/LEFT HEART CATH AND CORONARY ANGIOGRAPHY;  Surgeon: Leonie Man, MD;  Location: [place] [facility];  Service: Cardiovascular;  Laterality: N/A;   RIGHT/LEFT HEART CATH AND CORONARY ANGIOGRAPHY N/A 09/18/2020   Procedure: RIGHT/LEFT HEART CATH AND CORONARY ANGIOGRAPHY;  Surgeon: Nelva Bush, MD;  Location: [place] [facility];  Service: Cardiovascular;  Laterality: N/A;   TONSILLECTOMY     TOTAL SHOULDER ARTHROPLASTY Right x 2   VAGINAL HYSTERECTOMY     "total"    Current Medications: Current Meds  Medication Sig   acetaminophen (TYLENOL) 650 MG CR tablet Take 650 mg by mouth at bedtime.   amoxicillin (AMOXIL) 500 MG tablet Take 500 mg by mouth 3 (three) times daily.   Ascorbic Acid (VITAMIN C PO) Take 500 mg by mouth daily.   BD PEN NEEDLE NANO 2ND GEN 32G X  4 MM MISC USE TO INJECT INTO THE SKIN DAILY   carboxymethylcellulose (REFRESH PLUS) 0.5 % SOLN Place 1 drop into both eyes daily as needed (dry eyes).   Cholecalciferol (VITAMIN D3) 5000 units TABS Take 5,000 Units by mouth every morning.   Continuous Blood Gluc Receiver (DEXCOM G6 RECEIVER) DEVI Use as instructed to check blood sugar daily.   Continuous Blood Gluc Sensor (DEXCOM G6 SENSOR) MISC Use as instructed to check blood sugar daily   Continuous Blood Gluc Transmit (DEXCOM G6 TRANSMITTER) MISC Use as instructed to check blood sugar.   diltiazem (CARDIZEM) 30 MG tablet Take 1 tablet every 4 hours AS NEEDED for heart rate >100   docusate sodium (COLACE) 100 MG capsule Take 100 mg by mouth daily.   fenofibrate 54 MG tablet Take 54 mg by mouth daily.   glucose blood test strip Use as instructed to check blood sugar 2 times daily   insulin degludec (TRESIBA FLEXTOUCH) 100 UNIT/ML FlexTouch Pen Inject 8-10 Units into the skin daily.   Insulin Syringe-Needle U-100 (INSULIN SYRINGE .3CC/31GX5/16") 31G X 5/16" 0.3 ML MISC Use to inject insulin twice daily   levothyroxine  (SYNTHROID) 112 MCG tablet Take 1 tablet (112 mcg total) by mouth daily.   Multiple Vitamins-Minerals (ICAPS AREDS 2) CAPS Take 1 capsule by mouth 2 (two) times daily.   nitroGLYCERIN (NITROSTAT) 0.4 MG SL tablet Place 1 tablet (0.4 mg total) under the tongue every 5 (five) minutes as needed for chest pain.   pantoprazole (PROTONIX) 40 MG tablet Take 1 tablet (40 mg total) by mouth daily.   polyethylene glycol (MIRALAX / GLYCOLAX) 17 g packet Take 17 g by mouth daily as needed for moderate constipation.   Semaglutide, 2 MG/DOSE, (OZEMPIC, 2 MG/DOSE,) 8 MG/3ML SOPN Inject 2 mg into the skin once a week.   vitamin B-12 (CYANOCOBALAMIN) 500 MCG tablet Take 500 mcg by mouth daily.   [DISCONTINUED] amLODipine (NORVASC) 5 MG tablet Take 0.5 tablets (2.5 mg total) by mouth daily.   [DISCONTINUED] ELIQUIS 5 MG TABS tablet TAKE 1 TABLET(5 MG) BY MOUTH TWICE DAILY   [DISCONTINUED] losartan (COZAAR) 50 MG tablet Take 1 tablet (50 mg total) by mouth daily.   [DISCONTINUED] potassium chloride (KLOR-CON) 10 MEQ tablet Take 1.5 tablets (15 mEq total) by mouth daily.   [DISCONTINUED] torsemide (DEMADEX) 20 MG tablet Take 1.5 tablets (30 mg total) by mouth daily.   Current Facility-Administered Medications for the 04/14/21 encounter (Office Visit) with Kathlen Mody, Skylier Kretschmer H, PA-C  Medication   sodium chloride flush (NS) 0.9 % injection 3 mL     Allergies:   Demerol [meperidine], Epinephrine, Multaq [dronedarone], Sulfa antibiotics, Tetracyclines & related, Quinolones, Statins, and Imdur [isosorbide nitrate]   Social History   Socioeconomic History   Marital status: Married    Spouse name: Environmental manager   Number of children: 3   Years of education: Not on file   Highest education level: Master's degree (e.g., MA, MS, MEng, MEd, MSW, MBA)  Occupational History   Occupation: Retired    Comment: Therapist, sports  Tobacco Use   Smoking status: Former    Packs/day: 2.00    Years: 3.00    Pack years: 6.00    Types: Cigarettes     Quit date: 09/25/1968    Years since quitting: 52.5   Smokeless tobacco: Never   Tobacco comments:    10/05/2017 "smoked in my 20's"  Vaping Use   Vaping Use: Never used  Substance and Sexual Activity   Alcohol use:  Not Currently    Comment: 10/05/2017 "1 mixed drink q 2-3 months"   Drug use: Never   Sexual activity: Not on file  Other Topics Concern   Not on file  Social History Narrative   Moved from [facility] - originally from [place] - daughter lives in [place]   03/20/19 Lives in [place] w/ husband - he is demented- fairly functional   Retired Therapist, sports, Scientist, physiological also - variety of experiences   2 sons 1 daughter   Social Determinants of Radio broadcast assistant Strain: Not on Art therapist Insecurity: Not on file  Transportation Needs: Not on file  Physical Activity: Not on file  Stress: Not on file  Social Connections: Not on file     Family History: The patient's family history includes Breast cancer in her half-sister and sister; Colon cancer in her maternal grandmother and paternal grandmother; Dementia in her paternal uncle; Diabetes in her maternal grandfather, maternal grandmother, and paternal uncle; Heart defect (age of onset: 76) in her father; Hepatitis in her brother; Rectal cancer (age of onset: 3) in her son; Thyroid disease (age of onset: 68) in her mother. There is no history of Colon polyps, Stomach cancer, Liver cancer, or Pancreatic cancer.  ROS:   Please see the history of present illness.    All other systems reviewed and are negative.  EKGs/Labs/Other Studies Reviewed:    The following studies were reviewed today:  [facility] 09/18/20 Right Heart Pressures RA (mean): 12 mmHg RV (S/EDP): 46/12 mmHg PA (S/D, mean): 45/16 (26) mmHg PCWP (mean): 16 mmHg  Ao sat: 99% PA sat: 73%  Fick CO: 3.6 L/min Fick CI: 2.0 L/min/m^2    Left Ventricle LV end diastolic pressure is normal. LVEDP 15 mmHg.  Aortic Valve There is no aortic valve stenosis    Coronary  Diagrams   Diagnostic Dominance: Right  Severe single-vessel coronary artery disease with 70% stenosis involving small, bifurcating D2 branch that is similar to prior catheterization in 2019.  Mild to moderate, nonobstructive LAD disease also appears similar. Patent LCx/OM1 stent with 30-40% proximal in-stent restenosis, new from 2019. Upper normal left heart filling pressures. Mildly elevated pulmonary artery and right heart pressures. Mildly reduced Fick cardiac output/index. Marked systemic hypertension with systolic pressures greater than 200 mmHg. Recommendations: Escalate diuresis; increase torsemide to 20 mg daily.  BMP should be checked when Ms. Scardino is seen for follow-up by Dr. Caryl Comes or Ms. Visser later this month. Add amlodipine 2.5 mg daily for improved blood pressure control and antianginal therapy. Secondary prevention of coronary artery disease.  Recommend medical therapy of D2 branch, which is too small for PCI.     [facility] 08/26/2020 Pharmacological myocardial perfusion imaging study with no significant  Ischemia Small region of fixed defect in the mid to distal anteroseptal and apical region consistent with prior MI. GI uptake artifact. Hypokinesis of the mid to distal anteroseptal wall,  EF estimated at 57% No EKG changes concerning for ischemia at peak stress or in recovery. Low risk scan   Echo 08/04/20  1. Left ventricular ejection fraction, by estimation, is 50 to 55%. The  left ventricle has low normal function. Left ventricular endocardial  border not optimally defined to evaluate regional wall motion. There is  mild left ventricular hypertrophy. Left  ventricular diastolic parameters are consistent with Grade II diastolic  dysfunction (pseudonormalization). Elevated left atrial pressure.   2. Right ventricular systolic function is normal. The right ventricular  size is normal. Tricuspid regurgitation signal  is inadequate for assessing  PA pressure.   3. Left  atrial size was mildly dilated.   4. The mitral valve is degenerative. Mild to moderate mitral valve  regurgitation. No evidence of mitral stenosis.   5. The aortic valve is tricuspid. There is moderate calcification of the  aortic valve. There is moderate thickening of the aortic valve. Aortic  valve regurgitation is trivial.   6. Aortic dilatation noted. There is mild dilatation of the ascending  aorta, measuring 37 mm.   7. Mildly dilated pulmonary artery.    [facility] 05/03/2017 Relatively normal right heart cath pressures with only mildly elevated LVEDP/PCP. Prox LAD to Mid LAD lesion is 45% stenosed. Mid LAD lesion is 40% stenosed. Neither lesion was flow-limiting Lat 2nd Diag lesion is 70% stenosed. Vessel is way too small for PCI Previously placed Ost 1st Mrg stent (unknown type) is widely patent. Angiographically moderate disease in the mid and distal LAD with patent stent to the circumflex.  Otherwise no significant disease. Mildly elevated LVEDP and PCWP with essentially normal pulmonary pressures. Adequately diuresed Suspect that patient's decompensation was related to A. fib with RVR and likely diastolic heart failure. Plan: Patient will return to nursing unit for ongoing care with TR band removal. Gentle hydration post cath Defer further plans to [facility] / EP service.   Echo 04/2017 Procedure narrative: Transthoracic echocardiography. Technically    difficult study with reduced echo windows.  - Left ventricle: The cavity size was normal. Wall thickness was    increased in a pattern of mild LVH. Systolic function was normal.    The estimated ejection fraction was in the range of 50% to 55%.    Doppler parameters are consistent with abnormal left ventricular    relaxation (grade 1 diastolic dysfunction). The E/e&' ratio is    between 8-15, suggesting indeterminate LV filling pressure.  - Aortic valve: Trileaflet. Sclerosis without stenosis. There was    trivial  regurgitation.  - Mitral valve: Mildly thickened leaflets . There was trivial    regurgitation.  - Left atrium: The atrium was normal in size.  - Inferior vena cava: The vessel was normal in size. The    respirophasic diameter changes were in the normal range (>= 50%),    consistent with normal central venous pressure.    EKG:  EKG is  ordered today.  The ekg ordered today demonstrates AV dual paced rhythm, 55bpm, no significant change  Recent Labs: 07/31/2020: B Natriuretic Peptide 111.6 08/20/2020: ALT 13 08/31/2020: Hemoglobin 13.6; Platelets 211 11/20/2020: BUN 16; Creatinine, Ser 1.31; Potassium 4.4; Sodium 145 11/30/2020: TSH 2.050  Recent Lipid Panel    Component Value Date/Time   CHOL 219 (H) 08/20/2020 1216   CHOL 196 08/13/2020 1240   TRIG 269 (H) 08/20/2020 1216   HDL 41 08/20/2020 1216   HDL 43 08/13/2020 1240   CHOLHDL 5.3 08/20/2020 1216   VLDL 54 (H) 08/20/2020 1216   LDLCALC 124 (H) 08/20/2020 1216   LDLCALC 115 (H) 08/13/2020 1240   LDLDIRECT 63 09/17/2019 1134      Physical Exam:    VS:  BP (!) 158/80 (BP Location: Left Arm, Patient Position: Sitting, Cuff Size: Normal)    Pulse (!) 55    Ht 5\' 2"  (1.575 m)    Wt 181 lb 6 oz (82.3 kg)    SpO2 95%    BMI 33.17 kg/m     Wt Readings from Last 3 Encounters:  04/14/21 181 lb 6 oz (82.3  kg)  01/28/21 183 lb 6.4 oz (83.2 kg)  11/27/20 178 lb (80.7 kg)     GEN:  Well nourished, well developed in no acute distress HEENT: Normal NECK: No JVD; No carotid bruits LYMPHATICS: No lymphadenopathy CARDIAC: RRR, no murmurs, rubs, gallops RESPIRATORY:  Clear to auscultation without rales, wheezing or rhonchi  ABDOMEN: Soft, non-tender, non-distended MUSCULOSKELETAL:  No edema; No deformity  SKIN: Warm and dry NEUROLOGIC:  Alert and oriented x 3 PSYCHIATRIC:  Normal affect   ASSESSMENT:    1. Pre-operative cardiovascular examination   2. Essential hypertension   3. Hypokalemia   4. Paroxysmal atrial fibrillation  (HCC)   5. Coronary artery disease of native artery of native heart with stable angina pectoris ([place])   6. SSS (sick sinus syndrome) (HCC)   7. Cardiac pacemaker in situ    PLAN:    In order of problems listed above:  Pre-op Cardiac evaluation CAD s/p PCI 2019 with Chronic stable angina  Afib s/p DCCV 06/22/20 SSS s/p PPM [facility] HTN Patient presents for cardiac evaluation for screening colonoscopy that is scheduled 2/6. She has chronic stable angina that is unchanged. She uses CPAP at night. Last heart cath showed 70% stenosis in D1 branach and mild ISR 30-40%, mild elevated pulmonary pressures and right heart pressures, medical management was recommended for CAD. EKG today with AV pacing and no ischemic changes. METS>4. According to revised cardiac index she is Class IV risk 15% 30 day risk of death, MI, cardiac arrest. Colonoscopy is a lower risk procedure. Per MD, OK to hold Eliquis 2 days before procedure. Recommend Aspirin in the perioperative period given history of CAD, will defer to GI. No further cardiac work-up prior to procedure. I will increase amlodipine to 5mg  for BP and antianginal benefit.   Disposition: Follow up in 2 week(s) with MD/APP     Signed, Juliett Eastburn Ninfa Meeker, PA-C  04/14/2021 12:59 PM    [facility]

## 2021-04-14 ENCOUNTER — Encounter: Payer: Self-pay | Admitting: Medical

## 2021-04-14 ENCOUNTER — Ambulatory Visit: Payer: Medicare Other | Admitting: Medical

## 2021-04-14 ENCOUNTER — Other Ambulatory Visit: Payer: Self-pay

## 2021-04-14 VITALS — BP 158/80 | HR 55 | Ht 62.0 in | Wt 181.4 lb

## 2021-04-14 DIAGNOSIS — D122 Benign neoplasm of ascending colon: Secondary | ICD-10-CM | POA: Diagnosis not present

## 2021-04-14 DIAGNOSIS — D123 Benign neoplasm of transverse colon: Secondary | ICD-10-CM | POA: Diagnosis not present

## 2021-04-14 DIAGNOSIS — Z7901 Long term (current) use of anticoagulants: Secondary | ICD-10-CM | POA: Diagnosis not present

## 2021-04-14 DIAGNOSIS — N189 Chronic kidney disease, unspecified: Secondary | ICD-10-CM | POA: Diagnosis not present

## 2021-04-14 DIAGNOSIS — E876 Hypokalemia: Secondary | ICD-10-CM | POA: Diagnosis not present

## 2021-04-14 DIAGNOSIS — Z86010 Personal history of colonic polyps: Secondary | ICD-10-CM | POA: Diagnosis not present

## 2021-04-14 DIAGNOSIS — I480 Paroxysmal atrial fibrillation: Secondary | ICD-10-CM | POA: Diagnosis not present

## 2021-04-14 DIAGNOSIS — E1122 Type 2 diabetes mellitus with diabetic chronic kidney disease: Secondary | ICD-10-CM | POA: Diagnosis not present

## 2021-04-14 DIAGNOSIS — I130 Hypertensive heart and chronic kidney disease with heart failure and stage 1 through stage 4 chronic kidney disease, or unspecified chronic kidney disease: Secondary | ICD-10-CM | POA: Diagnosis not present

## 2021-04-14 DIAGNOSIS — D120 Benign neoplasm of cecum: Secondary | ICD-10-CM | POA: Diagnosis not present

## 2021-04-14 DIAGNOSIS — D124 Benign neoplasm of descending colon: Secondary | ICD-10-CM | POA: Diagnosis not present

## 2021-04-14 DIAGNOSIS — G4730 Sleep apnea, unspecified: Secondary | ICD-10-CM | POA: Diagnosis not present

## 2021-04-14 DIAGNOSIS — I509 Heart failure, unspecified: Secondary | ICD-10-CM | POA: Diagnosis not present

## 2021-04-14 DIAGNOSIS — K641 Second degree hemorrhoids: Secondary | ICD-10-CM | POA: Diagnosis not present

## 2021-04-14 DIAGNOSIS — Z1211 Encounter for screening for malignant neoplasm of colon: Secondary | ICD-10-CM | POA: Diagnosis not present

## 2021-04-14 DIAGNOSIS — Z800 Family history of malignant neoplasm of digestive organs: Secondary | ICD-10-CM | POA: Diagnosis not present

## 2021-04-14 DIAGNOSIS — Z950 Presence of cardiac pacemaker: Secondary | ICD-10-CM | POA: Diagnosis not present

## 2021-04-14 DIAGNOSIS — Z01810 Encounter for preprocedural cardiovascular examination: Secondary | ICD-10-CM | POA: Diagnosis not present

## 2021-04-14 DIAGNOSIS — K219 Gastro-esophageal reflux disease without esophagitis: Secondary | ICD-10-CM | POA: Diagnosis not present

## 2021-04-14 DIAGNOSIS — D125 Benign neoplasm of sigmoid colon: Secondary | ICD-10-CM | POA: Diagnosis not present

## 2021-04-14 DIAGNOSIS — Z87891 Personal history of nicotine dependence: Secondary | ICD-10-CM | POA: Diagnosis not present

## 2021-04-14 DIAGNOSIS — K5730 Diverticulosis of large intestine without perforation or abscess without bleeding: Secondary | ICD-10-CM | POA: Diagnosis not present

## 2021-04-14 DIAGNOSIS — I495 Sick sinus syndrome: Secondary | ICD-10-CM

## 2021-04-14 DIAGNOSIS — I4891 Unspecified atrial fibrillation: Secondary | ICD-10-CM | POA: Diagnosis not present

## 2021-04-14 DIAGNOSIS — I10 Essential (primary) hypertension: Secondary | ICD-10-CM | POA: Diagnosis not present

## 2021-04-14 DIAGNOSIS — I25118 Atherosclerotic heart disease of native coronary artery with other forms of angina pectoris: Secondary | ICD-10-CM

## 2021-04-14 DIAGNOSIS — I2510 Atherosclerotic heart disease of native coronary artery without angina pectoris: Secondary | ICD-10-CM | POA: Diagnosis not present

## 2021-04-14 MED ORDER — LOSARTAN POTASSIUM 50 MG PO TABS: 50.0000 mg | ORAL_TABLET | Freq: Every day | ORAL | 2 refills | Status: AC

## 2021-04-14 MED ORDER — APIXABAN 5 MG PO TABS: ORAL_TABLET | ORAL | 2 refills | Status: DC

## 2021-04-14 MED ORDER — TORSEMIDE 20 MG PO TABS: 30.0000 mg | ORAL_TABLET | Freq: Every day | ORAL | 2 refills | Status: AC

## 2021-04-14 MED ORDER — POTASSIUM CHLORIDE ER 10 MEQ PO TBCR: 15.0000 meq | EXTENDED_RELEASE_TABLET | Freq: Every day | ORAL | 2 refills | Status: DC

## 2021-04-14 MED ORDER — AMLODIPINE BESYLATE 5 MG PO TABS: 5.0000 mg | ORAL_TABLET | Freq: Every day | ORAL | 3 refills | Status: AC

## 2021-04-14 NOTE — Patient Instructions (Signed)
Medication Instructions:   Your physician has recommended you make the following change in your medication:   INCREASE amlodipine (Norvasc) to 5 mg daily  *If you need a refill on your cardiac medications before your next appointment, please call your pharmacy*   Lab Work: None ordered  If you have labs (blood work) drawn today and your tests are completely normal, you will receive your results only by: [place] (if you have MyChart) OR A paper copy in the mail If you have any lab test that is abnormal or we need to change your treatment, we will call you to review the results.   Testing/Procedures: None ordered   Follow-Up: At [facility], you and your health needs are our priority.  As part of our continuing mission to provide you with exceptional heart care, we have created designated Provider Care Teams.  These Care Teams include your primary Cardiologist (physician) and Advanced Practice Providers (APPs -  Physician Assistants and Nurse Practitioners) who all work together to provide you with the care you need, when you need it.  We recommend signing up for the patient portal called "MyChart".  Sign up information is provided on this After Visit Summary.  MyChart is used to connect with patients for Virtual Visits (Telemedicine).  Patients are able to view lab/test results, encounter notes, upcoming appointments, etc.  Non-urgent messages can be sent to your provider as well.   To learn more about what you can do with MyChart, go to [email or web address].    Your next appointment:   As scheduled   The format for your next appointment:   In Person  Provider:   You may see Nelva Bush, MD or one of the following Advanced Practice Providers on your designated Care Team:   Murray Hodgkins, NP Christell Faith, PA-C Cad[place] Kathlen Mody, PA-C:1}    Other Instructions N/A

## 2021-04-15 ENCOUNTER — Ambulatory Visit: Payer: Medicare Other | Admitting: Internal Medicine

## 2021-04-15 ENCOUNTER — Encounter: Payer: Self-pay | Admitting: Internal Medicine

## 2021-04-15 VITALS — BP 138/82 | HR 55 | Ht 62.0 in | Wt 183.2 lb

## 2021-04-15 DIAGNOSIS — E892 Postprocedural hypoparathyroidism: Secondary | ICD-10-CM | POA: Diagnosis not present

## 2021-04-15 DIAGNOSIS — E1322 Other specified diabetes mellitus with diabetic chronic kidney disease: Secondary | ICD-10-CM

## 2021-04-15 DIAGNOSIS — E1121 Type 2 diabetes mellitus with diabetic nephropathy: Secondary | ICD-10-CM

## 2021-04-15 DIAGNOSIS — E559 Vitamin D deficiency, unspecified: Secondary | ICD-10-CM | POA: Diagnosis not present

## 2021-04-15 DIAGNOSIS — N189 Chronic kidney disease, unspecified: Secondary | ICD-10-CM

## 2021-04-15 DIAGNOSIS — E890 Postprocedural hypothyroidism: Secondary | ICD-10-CM | POA: Diagnosis not present

## 2021-04-15 LAB — POCT GLYCOSYLATED HEMOGLOBIN (HGB A1C): Hemoglobin A1C: 6.5 % — AB (ref 4.0–5.6)

## 2021-04-15 MED ORDER — TRESIBA FLEXTOUCH 100 UNIT/ML ~~LOC~~ SOPN: 8.0000 [IU] | PEN_INJECTOR | Freq: Every day | SUBCUTANEOUS | 3 refills | Status: AC

## 2021-04-15 MED ORDER — LEVOTHYROXINE SODIUM 112 MCG PO TABS: 112.0000 ug | ORAL_TABLET | Freq: Every day | ORAL | 3 refills | Status: AC

## 2021-04-15 MED ORDER — DEXCOM G6 SENSOR MISC: 3 refills | Status: DC

## 2021-04-15 MED ORDER — DAPAGLIFLOZIN PROPANEDIOL 5 MG PO TABS: 5.0000 mg | ORAL_TABLET | Freq: Every day | ORAL | 3 refills | Status: AC

## 2021-04-15 NOTE — Progress Notes (Signed)
Patient ID: Dawn Garcia [facility], female   DOB: Nov 23, 1940, 81 y.o.   MRN: [id number]   This visit occurred during the SARS-CoV-2 public health emergency.  Safety protocols were in place, including screening questions prior to the visit, additional usage of staff PPE, and extensive cleaning of exam room while observing appropriate contact time as indicated for disinfecting solutions.   HPI  Dawn Garcia is a 81 y.o.-year-old female, initially referred by her PCP, Dr. Humphrey Rolls, presenting for follow-up for uncontrolled post ablative hypothyroidism and DM2, insulin-dependent since 2013, with long-term complications (atrial fibrillation, CHF, CKD).  Last visit 4 months ago.  Interim history: She continues intermittent fasting, started 04/2018.  Currently doing 16-8 when dose.  She lost more than 40 pounds since she started! She has pulmonary hypertension with significant fatigue, some shortness of breath. No increased urination, nausea. Her husband fell and broke his hip before last visit.  He died since.  She will be moving back to [place] next month to be closed to her family and friends. She previously live there for 40 years before moving to [place] to be close to her daughter 5 years ago. Her 2 sons and live there.  Post ablative hypothyroidism: Pt. has been dx with Graves ds. At 81 y/o. She had RAI Tx in 1960 >> postablative hypothyroidism  >> started on levothyroxine.  Patient had consistently suppressed TSH levels over the years so we are not decreasing her doses very slowly due to fatigue.  We decreased her levothyroxine 03/2019.  Pt is on levothyroxine 112 mcg daily, taken: - in am - fasting - no b'fast - no calcium   - no iron - no multivitamins - + PPIs-as needed, later in the day - + Areds  She was on amiodarone in 03/2017, but stopped in 04/2017 due to AKI.  Reviewed her TFTs: Lab Results  Component Value Date   TSH 2.050 11/30/2020   TSH 0.88 03/31/2020   TSH 1.27  07/29/2019   TSH 0.71 05/06/2019   TSH 0.20 (L) 03/20/2019   TSH 0.05 (L) 12/27/2018   TSH 0.03 (L) 10/02/2018   TSH 0.34 (L) 03/27/2018   TSH 0.04 (L) 02/05/2018   TSH 0.198 (L) 04/26/2017   FREET4 1.57 11/30/2020   FREET4 1.28 03/31/2020   FREET4 1.08 07/29/2019   FREET4 1.13 05/06/2019   FREET4 1.25 03/20/2019   FREET4 1.70 (H) 12/27/2018   FREET4 1.35 10/02/2018   FREET4 1.55 03/27/2018   FREET4 1.40 02/05/2018   FREET4 3.54 (H) 04/26/2017   T3FREE 3.4 02/05/2018   T3FREE 1.8 (L) 04/26/2017   T3FREE 3.0 11/08/2015   T3FREE 2.8 09/09/2015   T3FREE 3.1 08/19/2015  10/25/2017: TSH 0.021 (0.45-5.33), free T4 1.7 (0.66-1.14)  Her Graves' antibodies were not elevated: Lab Results  Component Value Date   TSI 102 02/05/2018    No results found for: THGAB No components found for: TPOAB  Pt denies: - feeling nodules in neck - hoarseness - dysphagia - choking - SOB with lying down  She has + FH of thyroid disorders in: mother (died after sx for toxic goiter) and sister (goiter).  No FH of thyroid cancer. No h/o radiation tx to head or neck other than RAI treatment.  No herbal supplements. No Biotin use. No recent steroids use.   She has a history of Graves' ophthalmopathy >> had increased IO pressure and did not respond to IV steroids >> she had radiation therapy and then eyelid surgery.  DM2: -Insulin-dependent since 2013.  Reviewed HbA1c levels: Lab Results  Component Value Date   HGBA1C 5.8 (A) 11/27/2020   HGBA1C 6.5 (A) 07/30/2020   HGBA1C 6.9 (A) 03/31/2020   HGBA1C 5.9 (A) 11/29/2019   HGBA1C 6.1 (A) 07/29/2019   HGBA1C 7.1 (A) 04/02/2019   HGBA1C 6.5 (A) 12/27/2018   HGBA1C 6.8 (A) 10/02/2018   HGBA1C 8.5 (A) 06/27/2018   HGBA1C 8.4 (A) 02/05/2018  08/14/2018: HbA1c 8.0%  10/25/2017: HbA1c 8.4%  Previously on: - Novolin 70/30 -not taken in the day she is fasting: - 20 >> 15 units before b'fast - 16 >> 10 units before dinner - Ozempic 1 mg weekly in  a.m.   Now on: - Ozempic 1 >> 2 mg weekly in a.m. - missed few doses due to pharmacy availability. - Tyler Aas 12 >> 10 >> 8 units daily She stopped [place] in 12/2019 due to good control, but we had to restart it afterwards. We started Jardiance in 03/2020 but stopped due to yeast infections.  She previously also had diarrhea with it.  She checks her sugars more than 4 times a day with her Dexcom CGM:   Previously:   Prev.:   + CKD, latest BUN/creatinine: Lab Results  Component Value Date   BUN 16 11/20/2020   BUN 25 (H) 11/03/2020   Lab Results  Component Value Date   CREATININE 1.31 (H) 11/20/2020   CREATININE 1.44 (H) 11/03/2020  10/25/2017: BUN/creatinine: 17/1.4, GFR 37  Latest eye exam - in 2022: No DR reportedly but macular degeneration; previously: + DR; she continues IO injections  She has a history of hyperparathyroidism: - h/o first sx: 1978 (2 glands excised) - h/o second sx: 2010 (1 gland excised - 3.2 g)  Her ionized calcium was slightly low in the past but normalized at last check: Lab Results  Component Value Date   CALCIUM 9.4 11/20/2020   CALCIUM 9.0 11/03/2020   CALCIUM 9.2 10/27/2020   CALCIUM 9.2 10/19/2020   CALCIUM 9.5 10/15/2020  Previously: Component     Latest Ref Rng & Units 10/02/2018  Calcium Ionized     4.8 - 5.6 mg/dL 4.89   Component     Latest Ref Rng & Units 02/05/2018 03/27/2018  Calcium Ionized     4.8 - 5.6 mg/dL 4.58 (L) 4.55 (L)   She has vitamin D deficiency: - she was on 1000 units vitamin D daily but this was stopped by PCP as her vitamin D was 80.  - we restarted 1000 units daily and she continues on this dose.   Reviewed latest vitamin D level: Lab Results  Component Value Date   VD25OH 90.2 11/30/2020   VD25OH 79.5 03/31/2020   VD25OH 69.65 05/06/2019   VD25OH 57.76 10/02/2018   VD25OH 80.87 03/27/2018   VD25OH 59.0 08/19/2015   She has a history of B12 deficiency-managed by PCP.  Latest level was normal Lab  Results  Component Value Date   [id number] 638 10/02/2018   VITAMINB12 >2000 (H) 08/19/2015   She also has a history of OSA-on CPAP, OA, history of melanoma, macular degeneration. She has MCI.  ROS: + See HPI  I reviewed pt's medications, allergies, PMH, social hx, family hx, and changes were documented in the history of present illness. Otherwise, unchanged from my initial visit note.  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[place])    Arthritis    "all over; particularly in  hands/fingers; all my joints" (10/05/2017)   Atrial fibrillation (HCC)    CAD (coronary artery disease)    CHF (congestive heart failure) (HCC)     A-Fib   Chronic kidney disease    COPD (chronic obstructive pulmonary disease) ([place])    "very mild" (10/05/2017)   Family history of adverse reaction to anesthesia    "daughter w/PONV & woke up during endoscopy" (10/05/2017)   GERD (gastroesophageal reflux disease)    Gout    Graves' disease    S/P "radioactive tx"   Heart disease    History of blood transfusion    "related to shoulder replacement"    History of gout    "no longer on daily RX" (10/05/2017)   History of hiatal hernia    Hyperlipidemia    Hypertension    Hypocalcemia    Hypokalemia    Hypothyroidism    Macular degeneration    Melanoma ([place]) ~ 2012   "off my back" (10/05/2017)   Myocardial infarction [facility])    "was told I'd had one; don't really know when" (10/05/2017)   Orthostatic hypotension 2020   OSA on CPAP    Pacemaker    Presence of permanent cardiac 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   Renal disorder    Scarring of lung 04/2017   "found by radiology" (10/05/2017)   Sinus node dysfunction (HCC)    Type II diabetes mellitus ([place])    Vitamin B12 deficiency    Vitamin D deficiency    Past Surgical History:  Procedure Laterality Date   APPENDECTOMY     ATRIAL FIBRILLATION ABLATION N/A 10/05/2017    Procedure: ATRIAL FIBRILLATION ABLATION;  Surgeon: Constance Haw, MD;  Location: [place] [facility];  Service: Cardiovascular;  Laterality: N/A;   ATRIAL FIBRILLATION ABLATION  ~ 2014   CARDIAC CATHETERIZATION  04/2017   CARDIOVERSION N/A 06/22/2020   Procedure: CARDIOVERSION;  Surgeon: Nelva Bush, MD;  Location: [facility];  Service: Cardiovascular;  Laterality: N/A;   COLONOSCOPY W/ BIOPSIES AND POLYPECTOMY  2015   1 polyp removed- repeat 3 years   CORONARY ANGIOPLASTY WITH STENT PLACEMENT  ~ 2014   EP IMPLANTABLE DEVICE N/A 11/10/2015   Procedure: Pacemaker Implant;  Surgeon: Will [name], MD;  Location: [place] [facility];  Service: Cardiovascular;  Laterality: N/A;   EP IMPLANTABLE DEVICE N/A 11/11/2015   Procedure: Lead Revision/Repair;  Surgeon: Evans Lance, MD;  Location: [place] [facility];  Service: Cardiovascular;  Laterality: N/A;   INGUINAL HERNIA REPAIR Left 1951   JOINT REPLACEMENT     LAPAROSCOPIC CHOLECYSTECTOMY     MELANOMA EXCISION  ~ 2012   "off my back" (10/05/2017)   PARATHYROIDECTOMY     "removed 3 glands; still have 1 gland" (10/05/2017)   RIGHT/LEFT HEART CATH AND CORONARY ANGIOGRAPHY N/A 05/03/2017   Procedure: RIGHT/LEFT HEART CATH AND CORONARY ANGIOGRAPHY;  Surgeon: Leonie Man, MD;  Location: [place] [facility];  Service: Cardiovascular;  Laterality: N/A;   RIGHT/LEFT HEART CATH AND CORONARY ANGIOGRAPHY N/A 09/18/2020   Procedure: RIGHT/LEFT HEART CATH AND CORONARY ANGIOGRAPHY;  Surgeon: Nelva Bush, MD;  Location: [place] [facility];  Service: Cardiovascular;  Laterality: N/A;   TONSILLECTOMY     TOTAL SHOULDER ARTHROPLASTY Right x 2   VAGINAL HYSTERECTOMY     "total"   Social History   Socioeconomic History   Marital status: Married    Spouse name: Not on file   Number of children: 3   Years of education: Not on  file   Highest education level: Not on file  Occupational History   Occupation: Retired Hotel manager strain: Not on file   Food insecurity:    Worry: Not on file    Inability: Not on file   Transportation needs:    Medical: Not on file    Non-medical: Not on file  Tobacco Use   Smoking status: Former Smoker, quit in 1967    Packs/day: 2.00    Years: 3.00    Pack years: 6.00    Types: Cigarettes   Smokeless tobacco: Never Used   Tobacco comment: 10/05/2017 "smoked in my 20's"  Substance and Sexual Activity   Alcohol use: Yes    Comment: 1-2 mixed drinks per month   Drug use: Never  Lifestyle  Relationships  Social History Narrative   Lives in [place] w/ husband.  She walks her dog every day.  Current Outpatient Medications on File Prior to Visit  Medication Sig Dispense Refill   acetaminophen (TYLENOL) 650 MG CR tablet Take 650 mg by mouth at bedtime.     amLODipine (NORVASC) 5 MG tablet Take 1 tablet (5 mg total) by mouth daily. 90 tablet 3   amoxicillin (AMOXIL) 500 MG tablet Take 500 mg by mouth 3 (three) times daily.     apixaban (ELIQUIS) 5 MG TABS tablet TAKE 1 TABLET(5 MG) BY MOUTH TWICE DAILY 180 tablet 2   Ascorbic Acid (VITAMIN C PO) Take 500 mg by mouth daily.     BD PEN NEEDLE NANO 2ND GEN 32G X 4 MM MISC USE TO INJECT INTO THE SKIN DAILY 100 each 3   carboxymethylcellulose (REFRESH PLUS) 0.5 % SOLN Place 1 drop into both eyes daily as needed (dry eyes).     Cholecalciferol (VITAMIN D3) 5000 units TABS Take 5,000 Units by mouth every morning.     Continuous Blood Gluc Receiver (DEXCOM G6 RECEIVER) DEVI Use as instructed to check blood sugar daily. 1 each 1   Continuous Blood Gluc Sensor (DEXCOM G6 SENSOR) MISC Use as instructed to check blood sugar daily 9 each 3   Continuous Blood Gluc Transmit (DEXCOM G6 TRANSMITTER) MISC Use as instructed to check blood sugar. 1 each 3   diltiazem (CARDIZEM) 30 MG tablet Take 1 tablet every 4 hours AS NEEDED for heart rate >100 30 tablet 1   docusate sodium (COLACE) 100 MG capsule Take 100 mg by mouth daily.      fenofibrate 54 MG tablet Take 54 mg by mouth daily.     glucose blood test strip Use as instructed to check blood sugar 2 times daily 200 each 3   insulin degludec (TRESIBA FLEXTOUCH) 100 UNIT/ML FlexTouch Pen Inject 8-10 Units into the skin daily. 9 mL 3   Insulin Syringe-Needle U-100 (INSULIN SYRINGE .3CC/31GX5/16") 31G X 5/16" 0.3 ML MISC Use to inject insulin twice daily 200 each 3   levothyroxine (SYNTHROID) 112 MCG tablet Take 1 tablet (112 mcg total) by mouth daily. 90 tablet 3   losartan (COZAAR) 50 MG tablet Take 1 tablet (50 mg total) by mouth daily. 90 tablet 2   Multiple Vitamins-Minerals (ICAPS AREDS 2) CAPS Take 1 capsule by mouth 2 (two) times daily.     nitroGLYCERIN (NITROSTAT) 0.4 MG SL tablet Place 1 tablet (0.4 mg total) under the tongue every 5 (five) minutes as needed for chest pain. 25 tablet 3   pantoprazole (PROTONIX) 40 MG tablet Take 1 tablet (40 mg total) by mouth  daily. 90 tablet 3   polyethylene glycol (MIRALAX / GLYCOLAX) 17 g packet Take 17 g by mouth daily as needed for moderate constipation.     potassium chloride (KLOR-CON) 10 MEQ tablet Take 1.5 tablets (15 mEq total) by mouth daily. 135 tablet 2   Semaglutide, 2 MG/DOSE, (OZEMPIC, 2 MG/DOSE,) 8 MG/3ML SOPN Inject 2 mg into the skin once a week. 9 mL 3   torsemide (DEMADEX) 20 MG tablet Take 1.5 tablets (30 mg total) by mouth daily. 45 tablet 2   vitamin B-12 (CYANOCOBALAMIN) 500 MCG tablet Take 500 mcg by mouth daily.     Current Facility-Administered Medications on File Prior to Visit  Medication Dose Route Frequency Provider Last Rate Last Admin   albuterol (PROVENTIL) (2.5 MG/3ML) 0.083% nebulizer solution 2.5 mg  2.5 mg Nebulization Once Tyler Pita, MD       sodium chloride flush (NS) 0.9 % injection 3 mL  3 mL Intravenous Q12H Visser, Jacquelyn D, PA-C       Allergies  Allergen Reactions   Demerol [Meperidine] Nausea And Vomiting    Tolerated Fentanyl 11/10/15   Epinephrine Shortness Of  Breath and Palpitations    Powder   Multaq [Dronedarone] Other (See Comments)    Kidney failure and lung problems   Sulfa Antibiotics Anaphylaxis   Tetracyclines & Related Swelling and Other (See Comments)    Made nose, lips,  And tongue itchy   Quinolones Other (See Comments)    Aortic dilation. Avoid FQ, as these are showing to increase Ao dilation.    Statins     Foggy head, episodic amnesia    Imdur [Isosorbide Nitrate]     HA   Family History  Problem Relation Age of Onset   Thyroid disease Mother 84       3 days after surgery   Heart defect Father 59       ?ascending aortic aneurysm   Breast cancer Sister    Hepatitis Brother    Diabetes Paternal Uncle    Dementia Paternal Uncle    Colon cancer Maternal Grandmother    Diabetes Maternal Grandmother    Diabetes Maternal Grandfather    Colon cancer Paternal Grandmother        50's   Rectal cancer Son 59   Breast cancer Half-Sister    Colon polyps Neg Hx    Stomach cancer Neg Hx    Liver cancer Neg Hx    Pancreatic cancer Neg Hx    Also: -Diabetes in maternal grandparents -Hypertension and uncle-hyperlipidemia in the whole family -Heart disease in father, mother, uncle-thyroid problems in mother and sister-cancer in son, grandmother, half sister  PE: BP 138/82 (BP Location: Right Arm, Patient Position: Sitting, Cuff Size: Normal)    Pulse (!) 55    Ht 5\' 2"  (1.575 m)    Wt 183 lb 3.2 oz (83.1 kg)    SpO2 99%    BMI 33.51 kg/m  Wt Readings from Last 3 Encounters:  04/15/21 183 lb 3.2 oz (83.1 kg)  04/14/21 181 lb 6 oz (82.3 kg)  01/28/21 183 lb 6.4 oz (83.2 kg)   Constitutional: overweight, in NAD Eyes: PERRLA, EOMI, no exophthalmos ENT: moist mucous membranes, no thyromegaly, no cervical lymphadenopathy Cardiovascular: RRR, No MRG Respiratory: CTA B Musculoskeletal: no deformities, strength intact in all 4 Skin: moist, warm, no rashes Neurological: no tremor with outstretched hands, DTR normal in all  4  ASSESSMENT: 1. Postablative Hypothyroidism  2.  LADA, managed as type  2 diabetes  3.  History of hypocalcemia -History of hyperparathyroidism  4.  Vitamin D deficiency  PLAN:  1. Patient with longstanding, uncontrolled hypothyroidism, with better control lately - latest thyroid labs reviewed with pt. >> normal: Lab Results  Component Value Date   TSH 2.050 11/30/2020  - she continues on LT4 112 mcg daily - pt feels good on this dose.  I refilled her prescription today. - we discussed about taking the thyroid hormone every day, with water, >30 minutes before breakfast, separated by >4 hours from acid reflux medications, calcium, iron, multivitamins. Pt. is taking it correctly.  2. LADA -Patient with longstanding, previously uncontrolled type 2 diabetes, then with drastically improved control after she started intermittent fasting.  HbA1c improved to 5.8% at last visit.  At that time, we reduced her Tresiba dose and continued Ozempic at 2 mg daily.  At that time, sugars remained lower during the night and they were increasing after breakfast, but not as much as before.  They were only occasionally increasing to 180 after lunch and dinner, but overall, there was improvement from the previous visit.  Of note, we had her on Jardiance before but she developed yeast infections. 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 CGM interpretation: -At today's visit, we reviewed her CGM downloads: It appears that 63.8% of values are in target range (goal >70%), while 36.1% are higher than 180 (goal <25%), and 0% are lower than 70 (goal <4%).  The calculated average blood sugar is 174.   -Reviewing the CGM trends, sugars are at goal usually during the night especially in the second half of the night with a nadir around 7 AM and they increase significantly after lunch and then again after dinner.  Upon  questioning, she forgot to decrease the dose of Tresiba from 10 units to 8 units daily, but since she does not have any lows for now, we can continue this dose.  She also continues on Ozempic 2 mg weekly.  She had some missed doses due to availability at the pharmacy, but not consistently. -At this visit, we discussed that we will need better mealtime coverage.  I would prefer not to add mealtime insulin for now and we decided together to try a low-dose of another SGLT2 inhibitor, Farxiga.  I advised her to let me know if she has a yeast infection, in which case I will call in Diflucan to her pharmacy.  If the yeast infection is not resolving, she will need to come off the medication.  -I refilled her diabetic medications today -I advised her to: Patient Instructions  Please continue: - Ozempic 2 mg weekly - Tresiba 10 units daily  Start: - Farxiga 5 mg before b'fast  Please continue: - Levothyroxine 112 mcg daily  Take the thyroid hormone every day, with water, at least 30 minutes before breakfast, separated by at least 4 hours from: - acid reflux medications - calcium - iron - multivitamins  Please return in 4 months.  - we checked her HbA1c: 6.5% (higher) - advised to check sugars at different times of the day - 4x a day, rotating check times - advised for yearly eye exams >> she is UTD -This is her last visit, as she is moving to [place]  3. H/o Hypocalcemia -She has a history of hyperparathyroidism, now status post 3/4 parathyroidectomy -Calcium was normal at last check: Lab Results  Component Value Date   CALCIUM 9.4 11/20/2020   PHOS 4.1 05/03/2017  -Not on Tums, as she prefers to take her calcium from the diet  4.  Vitamin D deficiency -She was on 1000 units vitamin D daily at last visit -Vitamin D level returned quite high in the normal range, at 90.2, so I advised her to stop vitamin D completely -She has an appointment with a new PCP in [place] -further management per  PCP  Philemon Kingdom, MD PhD [facility] Endocrinology

## 2021-04-15 NOTE — Patient Instructions (Signed)
Please continue: - Ozempic 2 mg weekly - Tresiba 10 units daily  Start: - Farxiga 5 mg before b'fast  Please continue: - Levothyroxine 112 mcg daily  Take the thyroid hormone every day, with water, at least 30 minutes before breakfast, separated by at least 4 hours from: - acid reflux medications - calcium - iron - multivitamins  Please return in 4 months.

## 2021-04-16 ENCOUNTER — Encounter: Payer: Self-pay | Admitting: Gastroenterology

## 2021-04-19 ENCOUNTER — Other Ambulatory Visit: Payer: Self-pay

## 2021-04-19 ENCOUNTER — Ambulatory Visit
Admission: RE | Admit: 2021-04-19 | Discharge: 2021-04-19 | Disposition: A | Discharge status: Home / Self Care | Payer: Medicare Other | Attending: Gastroenterology | Admitting: Gastroenterology

## 2021-04-19 ENCOUNTER — Ambulatory Visit: Payer: Medicare Other | Admitting: Anesthesiology

## 2021-04-19 ENCOUNTER — Encounter: Payer: Self-pay | Admitting: Gastroenterology

## 2021-04-19 ENCOUNTER — Encounter
Admission: RE | Disposition: A | Discharge status: Home / Self Care | Payer: Self-pay | Source: Home / Self Care | Attending: Gastroenterology

## 2021-04-19 DIAGNOSIS — Z87891 Personal history of nicotine dependence: Secondary | ICD-10-CM | POA: Insufficient documentation

## 2021-04-19 DIAGNOSIS — Z800 Family history of malignant neoplasm of digestive organs: Secondary | ICD-10-CM | POA: Insufficient documentation

## 2021-04-19 DIAGNOSIS — D123 Benign neoplasm of transverse colon: Secondary | ICD-10-CM | POA: Insufficient documentation

## 2021-04-19 DIAGNOSIS — D124 Benign neoplasm of descending colon: Secondary | ICD-10-CM | POA: Insufficient documentation

## 2021-04-19 DIAGNOSIS — E1122 Type 2 diabetes mellitus with diabetic chronic kidney disease: Secondary | ICD-10-CM | POA: Insufficient documentation

## 2021-04-19 DIAGNOSIS — I509 Heart failure, unspecified: Secondary | ICD-10-CM | POA: Insufficient documentation

## 2021-04-19 DIAGNOSIS — Z7901 Long term (current) use of anticoagulants: Secondary | ICD-10-CM | POA: Insufficient documentation

## 2021-04-19 DIAGNOSIS — N189 Chronic kidney disease, unspecified: Secondary | ICD-10-CM | POA: Insufficient documentation

## 2021-04-19 DIAGNOSIS — I2510 Atherosclerotic heart disease of native coronary artery without angina pectoris: Secondary | ICD-10-CM | POA: Insufficient documentation

## 2021-04-19 DIAGNOSIS — K219 Gastro-esophageal reflux disease without esophagitis: Secondary | ICD-10-CM | POA: Insufficient documentation

## 2021-04-19 DIAGNOSIS — D120 Benign neoplasm of cecum: Secondary | ICD-10-CM | POA: Insufficient documentation

## 2021-04-19 DIAGNOSIS — I130 Hypertensive heart and chronic kidney disease with heart failure and stage 1 through stage 4 chronic kidney disease, or unspecified chronic kidney disease: Secondary | ICD-10-CM | POA: Insufficient documentation

## 2021-04-19 DIAGNOSIS — Z1211 Encounter for screening for malignant neoplasm of colon: Secondary | ICD-10-CM | POA: Diagnosis not present

## 2021-04-19 DIAGNOSIS — G4730 Sleep apnea, unspecified: Secondary | ICD-10-CM | POA: Insufficient documentation

## 2021-04-19 DIAGNOSIS — K641 Second degree hemorrhoids: Secondary | ICD-10-CM | POA: Insufficient documentation

## 2021-04-19 DIAGNOSIS — I4891 Unspecified atrial fibrillation: Secondary | ICD-10-CM | POA: Insufficient documentation

## 2021-04-19 DIAGNOSIS — D125 Benign neoplasm of sigmoid colon: Secondary | ICD-10-CM | POA: Insufficient documentation

## 2021-04-19 DIAGNOSIS — K5730 Diverticulosis of large intestine without perforation or abscess without bleeding: Secondary | ICD-10-CM | POA: Insufficient documentation

## 2021-04-19 DIAGNOSIS — D122 Benign neoplasm of ascending colon: Secondary | ICD-10-CM | POA: Insufficient documentation

## 2021-04-19 DIAGNOSIS — Z86010 Personal history of colonic polyps: Secondary | ICD-10-CM | POA: Insufficient documentation

## 2021-04-19 DIAGNOSIS — Z950 Presence of cardiac pacemaker: Secondary | ICD-10-CM | POA: Insufficient documentation

## 2021-04-19 HISTORY — PX: COLONOSCOPY WITH PROPOFOL: SHX5780

## 2021-04-19 SURGERY — COLONOSCOPY WITH PROPOFOL
Anesthesia: General

## 2021-04-19 MED ORDER — PROPOFOL 10 MG/ML IV BOLUS
INTRAVENOUS | Status: DC | PRN
  Administered 2021-04-19: 20 mg via INTRAVENOUS
  Administered 2021-04-19: 80 mg via INTRAVENOUS

## 2021-04-19 MED ORDER — PROPOFOL 500 MG/50ML IV EMUL
INTRAVENOUS | Status: DC | PRN
  Administered 2021-04-19: 100 ug/kg/min via INTRAVENOUS

## 2021-04-19 MED ORDER — ONDANSETRON HCL 4 MG/2ML IJ SOLN
INTRAMUSCULAR | Status: AC
  Filled 2021-04-19: qty 2

## 2021-04-19 MED ORDER — ONDANSETRON HCL 4 MG/2ML IJ SOLN
INTRAMUSCULAR | Status: DC | PRN
Start: 2021-04-19 — End: 2021-04-19
  Administered 2021-04-19: 4 mg via INTRAVENOUS

## 2021-04-19 MED ORDER — SODIUM CHLORIDE 0.9 % IV SOLN: INTRAVENOUS | Status: DC

## 2021-04-19 MED ORDER — PROPOFOL 10 MG/ML IV BOLUS
INTRAVENOUS | Status: AC
  Filled 2021-04-19: qty 20

## 2021-04-19 MED ORDER — PROPOFOL 500 MG/50ML IV EMUL
INTRAVENOUS | Status: AC
  Filled 2021-04-19: qty 50

## 2021-04-19 NOTE — Op Note (Signed)
[facility] Patient Name: Dawn Garcia Procedure Date: 04/19/2021 7:33 AM MRN: [id number] Account #: [id number] Date of Birth: 05/25/1940 Admit Type: Outpatient Age: 81 Room: [facility] ENDO ROOM 2 Gender: Female Note Status: Finalized Instrument Name: Colonscope [id number] Procedure:             Colonoscopy Indications:           High risk colon cancer surveillance: Personal history                         of colonic polyps Providers:             [name] DO, DO Medicines:             Monitored Anesthesia Care Complications:         No immediate complications. Estimated blood loss:                         Minimal. Procedure:             Pre-Anesthesia Assessment:                        - Prior to the procedure, a History and Physical was                         performed, and patient medications and allergies were                         reviewed. The patient is competent. The risks and                         benefits of the procedure and the sedation options and                         risks were discussed with the patient. All questions                         were answered and informed consent was obtained.                         Patient identification and proposed procedure were                         verified by the physician, the nurse, the anesthetist                         and the technician in the endoscopy suite. Mental                         Status Examination: alert and oriented. Airway                         Examination: normal oropharyngeal airway and neck                         mobility. Respiratory Examination: clear to                         auscultation. CV Examination: RRR, no murmurs, no S3  or S4. Prophylactic Antibiotics: The patient does not                         require prophylactic antibiotics. Prior                         Anticoagulants: The patient has taken Eliquis                          (apixaban), last dose was 2 days prior to procedure.                         ASA Grade Assessment: IV - A patient with severe                         systemic disease that is a constant threat to life.                         After reviewing the risks and benefits, the patient                         was deemed in satisfactory condition to undergo the                         procedure. The anesthesia plan was to use monitored                         anesthesia care (MAC). Immediately prior to                         administration of medications, the patient was                         re-assessed for adequacy to receive sedatives. The                         heart rate, respiratory rate, oxygen saturations,                         blood pressure, adequacy of pulmonary ventilation, and                         response to care were monitored throughout the                         procedure. The physical status of the patient was                         re-assessed after the procedure.                        After obtaining informed consent, the colonoscope was                         passed under direct vision. Throughout the procedure,                         the patient's blood pressure, pulse, and oxygen  saturations were monitored continuously. The                         Colonoscope was introduced through the anus and                         advanced to the the cecum, identified by appendiceal                         orifice and ileocecal valve. The colonoscopy was                         performed without difficulty. The patient tolerated                         the procedure well. The quality of the bowel                         preparation was evaluated using the BBPS [facility] Bowel                         Preparation Scale) with scores of: Right Colon = 2                         (minor amount of residual staining, small fragments of                          stool and/or opaque liquid, but mucosa seen well),                         Transverse Colon = 3 (entire mucosa seen well with no                         residual staining, small fragments of stool or opaque                         liquid) and Left Colon = 3 (entire mucosa seen well                         with no residual staining, small fragments of stool or                         opaque liquid). The total BBPS score equals 8. The                         quality of the bowel preparation was excellent. The                         ileocecal valve, appendiceal orifice, and rectum were                         photographed. Findings:      Hemorrhoids were found on perianal exam.      The digital rectal exam was normal. Pertinent negatives include normal       sphincter tone.      Ten sessile polyps were found in the descending colon, transverse colon,  ascending colon and cecum. The polyps were 3 to 7 mm in size. These       polyps were removed with a cold snare. Resection and retrieval were       complete. Estimated blood loss was minimal.      Two sessile polyps were found in the sigmoid colon. The polyps were 1 to       2 mm in size. These polyps were removed with a cold biopsy forceps.       Resection and retrieval were complete. Estimated blood loss was minimal.      Multiple small-mouthed diverticula were found in the left colon.       Estimated blood loss: none.      Non-bleeding internal hemorrhoids were found during retroflexion. The       hemorrhoids were Grade II (internal hemorrhoids that prolapse but reduce       spontaneously). Estimated blood loss: none.      The exam was otherwise without abnormality on direct and retroflexion       views. Impression:            - Hemorrhoids found on perianal exam.                        - Ten 3 to 7 mm polyps in the descending colon, in the                         transverse colon, in the ascending colon and in the                          cecum, removed with a cold snare. Resected and                         retrieved.                        - Two 1 to 2 mm polyps in the sigmoid colon, removed                         with a cold biopsy forceps. Resected and retrieved.                        - Diverticulosis in the left colon.                        - Non-bleeding internal hemorrhoids.                        - The examination was otherwise normal on direct and                         retroflexion views. Recommendation:        - Discharge patient to home.                        - Resume previous diet.                        - No aspirin, ibuprofen, naproxen, or other  non-steroidal anti-inflammatory drugs for 5 days after                         polyp removal.                        - Resume Eliquis (apixaban) at prior dose in 2 days.                         Refer to referring physician for further adjustment of                         therapy.                        - Continue present medications.                        - Await pathology results.                        - Repeat colonoscopy in 1 year for surveillance and                         for surveillance of multiple polyps.                        - Return to GI office as previously scheduled. Procedure Code(s):     --- Professional ---                        [phone], Colonoscopy, flexible; with removal of                         tumor(s), polyp(s), or other lesion(s) by snare                         technique                        45380, 50, Colonoscopy, flexible; with biopsy, single                         or multiple Diagnosis Code(s):     --- Professional ---                        Z86.010, Personal history of colonic polyps                        K64.1, Second degree hemorrhoids                        K63.5, Polyp of colon                        K57.30, Diverticulosis of large intestine without                          perforation or abscess without bleeding CPT copyright 2019 American Medical Association. All rights reserved. The codes documented in this report are preliminary and upon coder review may  be revised to meet current compliance requirements.  Attending Participation:      I personally performed the entire procedure. Volney American, DO [name] DO, DO 04/19/2021 8:32:30 AM This report has been signed electronically. Number of Addenda: 0 Note Initiated On: 04/19/2021 7:33 AM Scope Withdrawal Time: 0 hours 27 minutes 5 seconds  Total Procedure Duration: 0 hours 40 minutes 23 seconds  Estimated Blood Loss:  Estimated blood loss was minimal.      [facility]

## 2021-04-19 NOTE — H&P (Signed)
Pre-Procedure H&P   Patient ID: Dawn Garcia is a 81 y.o. female.  Gastroenterology Provider: [name], DO  Referring Provider: Laurine Blazer, PA PCP: Perrin Maltese, MD  Date: 04/19/2021  HPI [name] is a 81 y.o. female who presents today for Colonoscopy for surveillance- phx colon polyps; fhx rectal ca (son-40s).  Pt with normal BM if taking daily miralax, otherwise deals with constipation. No hematochezia/melena Eliquis held for for procedure. Last dose Friday. S/p ppm. Last hgb 14, mcv 88 PGF with CRC, Son with rectal ca (in 31s) Last colonoscopy 2014 with polyps No polyps in 2004. S/p hysto and chole.  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HCC)    Arteriosclerosis of coronary artery in patient with history of myocardial infarction 07/02/2017   Arthritis    "all over; particularly in hands/fingers; all my joints" (10/05/2017)   Atrial fibrillation ([place])    B12 deficiency 09/10/2015   CAD (coronary artery disease)    CHF (congestive heart failure) (HCC)     A-Fib   Chronic kidney disease    COPD (chronic obstructive pulmonary disease) ([place])    "very mild" (10/05/2017)   Family history of adverse reaction to anesthesia    "daughter w/PONV & woke up during endoscopy" (10/05/2017)   GERD (gastroesophageal reflux disease)    Gout    Graves' disease    S/P "radioactive tx"   Heart disease    History of blood transfusion    "related to shoulder replacement"    History of gout    "no longer on daily RX" (10/05/2017)   History of hiatal hernia    Hyperlipidemia    Hypertension    Hypocalcemia    Hypokalemia    Hypothyroidism    Macular degeneration    Melanoma ([place]) ~ 2012   "off my back" (10/05/2017)   Myocardial infarction [facility]    "was told I'd had one; don't really know when" (10/05/2017)   Orthostatic hypotension 2020   OSA on  CPAP    Pacemaker    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 [facility];  Service: Cardiovascular;  Laterality: N/A;   ATRIAL FIBRILLATION ABLATION  ~ 2014   CARDIAC CATHETERIZATION  04/2017   CARDIOVERSION N/A 06/22/2020   Procedure: CARDIOVERSION;  Surgeon: Nelva Bush, MD;  Location: [facility] OR[facility];  Service: Cardiovascular;  Laterality: N/A;   COLONOSCOPY W/ BIOPSIES AND POLYPECTOMY  2015   1 polyp removed- repeat 3 years   CORONARY ANGIOPLASTY WITH STENT PLACEMENT  ~ 2014   EP IMPLANTABLE DEVICE N/A 11/10/2015   Procedure: Pacemaker Implant;  Surgeon: Will [name], MD;  Location: [place] [facility];  Service: Cardiovascular;  Laterality: N/A;   EP IMPLANTABLE DEVICE N/A 11/11/2015   Procedure: Lead Revision/Repair;  Surgeon: Evans Lance, MD;  Location: [place] [facility];  Service: Cardiovascular;  Laterality: N/A;   INGUINAL HERNIA REPAIR Left 1951  JOINT REPLACEMENT     LAPAROSCOPIC CHOLECYSTECTOMY     MELANOMA EXCISION  ~ 2012   "off my back" (10/05/2017)   PARATHYROIDECTOMY     "removed 3 glands; still have 1 gland" (10/05/2017)   RIGHT/LEFT HEART CATH AND CORONARY ANGIOGRAPHY N/A 05/03/2017   Procedure: RIGHT/LEFT HEART CATH AND CORONARY ANGIOGRAPHY;  Surgeon: Leonie Man, MD;  Location: [place] [facility];  Service: Cardiovascular;  Laterality: N/A;   RIGHT/LEFT HEART CATH AND CORONARY ANGIOGRAPHY N/A 09/18/2020   Procedure: RIGHT/LEFT HEART CATH AND CORONARY ANGIOGRAPHY;  Surgeon: Nelva Bush, MD;  Location: [place] [facility];  Service:  Cardiovascular;  Laterality: N/A;   TONSILLECTOMY     TOTAL SHOULDER ARTHROPLASTY Right x 2   VAGINAL HYSTERECTOMY     "total"    Family History PGF with CRC, Son with rectal ca (in 28s) No other h/o GI disease or malignancy  Review of Systems  Constitutional:  Negative for activity change, appetite change, chills, diaphoresis, fatigue, fever and unexpected weight change.  HENT:  Negative for trouble swallowing and voice change.   Respiratory:  Negative for shortness of breath and wheezing.   Cardiovascular:  Negative for chest pain, palpitations and leg swelling.  Gastrointestinal:  Positive for constipation. Negative for abdominal distention, abdominal pain, anal bleeding, blood in stool, diarrhea, nausea, rectal pain and vomiting.  Musculoskeletal:  Negative for arthralgias and myalgias.  Skin:  Negative for color change and pallor.  Neurological:  Negative for dizziness, syncope and weakness.  Psychiatric/Behavioral:  Negative for confusion.   All other systems reviewed and are negative.   Medications Current Facility-Administered Medications on File Prior to Encounter  Medication Dose Route Frequency Provider Last Rate Last Admin   albuterol (PROVENTIL) (2.5 MG/3ML) 0.083% nebulizer solution 2.5 mg  2.5 mg Nebulization Once Tyler Pita, MD       Current Outpatient Medications on File Prior to Encounter  Medication Sig Dispense Refill   acetaminophen (TYLENOL) 650 MG CR tablet Take 650 mg by mouth at bedtime.     Ascorbic Acid (VITAMIN C PO) Take 500 mg by mouth daily.     Cholecalciferol (VITAMIN D3) 5000 units TABS Take 5,000 Units by mouth every morning.     clobetasol ointment (TEMOVATE) 3.26 % Apply 1 application topically 2 (two) times daily.     Multiple Vitamins-Minerals (ICAPS AREDS 2) CAPS Take 1 capsule by mouth 2 (two) times daily.     pantoprazole (PROTONIX) 40 MG tablet Take 1 tablet (40 mg total) by mouth daily. 90 tablet 3   polyethylene glycol (MIRALAX  / GLYCOLAX) 17 g packet Take 17 g by mouth daily as needed for moderate constipation.     vitamin B-12 (CYANOCOBALAMIN) 500 MCG tablet Take 500 mcg by mouth daily.     carboxymethylcellulose (REFRESH PLUS) 0.5 % SOLN Place 1 drop into both eyes daily as needed (dry eyes). (Patient not taking: Reported on 04/19/2021)     clonazePAM (KLONOPIN) 0.5 MG tablet Take 0.5 mg by mouth daily. (Patient not taking: Reported on 04/19/2021)     Continuous Blood Gluc Receiver (DEXCOM G6 RECEIVER) DEVI Use as instructed to check blood sugar daily. 1 each 1   Continuous Blood Gluc Transmit (DEXCOM G6 TRANSMITTER) MISC Use as instructed to check blood sugar. 1 each 3   diltiazem (CARDIZEM) 30 MG tablet Take 1 tablet every 4 hours AS NEEDED for heart rate >100 (Patient not taking: Reported on 04/19/2021) 30 tablet 1   empagliflozin (JARDIANCE) 10 MG  TABS tablet Take 10 mg by mouth daily. (Patient not taking: Reported on 04/19/2021)     fenofibrate 54 MG tablet Take 54 mg by mouth daily.     glucose blood test strip Use as instructed to check blood sugar 2 times daily 200 each 3   Insulin Syringe-Needle U-100 (INSULIN SYRINGE .3CC/31GX5/16") 31G X 5/16" 0.3 ML MISC Use to inject insulin twice daily 200 each 3   nitroGLYCERIN (NITROSTAT) 0.4 MG SL tablet Place 1 tablet (0.4 mg total) under the tongue every 5 (five) minutes as needed for chest pain. 25 tablet 3   Omega-3 Fatty Acids (OMEGA-3 FISH OIL PO) Take by mouth. (Patient not taking: Reported on 04/19/2021)     PYRIDOSTIGMINE BROMIDE PO Take by mouth. (Patient not taking: Reported on 04/19/2021)     Zinc Acetate, Oral, (ZINC ACETATE PO) Take by mouth daily. (Patient not taking: Reported on 04/19/2021)      Pertinent medications related to GI and procedure were reviewed by me with the patient prior to the procedure   Current Facility-Administered Medications:    0.9 %  sodium chloride infusion, , Intravenous, Continuous, [name], DO, Last Rate: 20 mL/hr at  04/19/21 0718, New Bag at 04/19/21 0718  Facility-Administered Medications Ordered in Other Encounters:    albuterol (PROVENTIL) (2.5 MG/3ML) 0.083% nebulizer solution 2.5 mg, 2.5 mg, Nebulization, Once, Tyler Pita, MD  sodium chloride 20 mL/hr at 04/19/21 0737       Allergies  Allergen Reactions   Demerol [Meperidine] Nausea And Vomiting    Tolerated Fentanyl 11/10/15   Epinephrine Shortness Of Breath and Palpitations    Powder   Multaq [Dronedarone] Other (See Comments)    Kidney failure and lung problems   Sulfa Antibiotics Anaphylaxis   Tetracyclines & Related Swelling and Other (See Comments)    Made nose, lips,  And tongue itchy   Quinolones Other (See Comments)    Aortic dilation. Avoid FQ, as these are showing to increase Ao dilation.    Statins     Foggy head, episodic amnesia    Imdur [Isosorbide Nitrate]     HA   Allergies were reviewed by me prior to the procedure  Objective    Vitals:   04/19/21 0704  BP: 126/68  Pulse: (!) 55  Resp: 18  Temp: (!) 96.9 F (36.1 C)  TempSrc: Temporal  SpO2: 99%  Weight: 81.6 kg  Height: 5\' 2"  (1.575 m)     Physical Exam Vitals and nursing note reviewed.  Constitutional:      General: She is not in acute distress.    Appearance: Normal appearance. She is not ill-appearing, toxic-appearing or diaphoretic.  HENT:     Head: Normocephalic and atraumatic.     Nose: Nose normal.     Mouth/Throat:     Mouth: Mucous membranes are moist.     Pharynx: Oropharynx is clear.  Eyes:     General: No scleral icterus.    Extraocular Movements: Extraocular movements intact.  Cardiovascular:     Rate and Rhythm: Normal rate and regular rhythm.     Heart sounds: Normal heart sounds. No murmur heard.   No friction rub. No gallop.     Comments: Ppm in place Pulmonary:     Effort: Pulmonary effort is normal. No respiratory distress.     Breath sounds: Normal breath sounds. No wheezing, rhonchi or rales.  Abdominal:      General: Bowel sounds are normal. There is no distension.  Palpations: Abdomen is soft.     Tenderness: There is no abdominal tenderness. There is no guarding or rebound.  Musculoskeletal:     Cervical back: Neck supple.     Right lower leg: No edema.     Left lower leg: No edema.  Skin:    General: Skin is warm and dry.     Coloration: Skin is not jaundiced or pale.  Neurological:     General: No focal deficit present.     Mental Status: She is alert and oriented to person, place, and time. Mental status is at baseline.  Psychiatric:        Mood and Affect: Mood normal.        Behavior: Behavior normal.        Thought Content: Thought content normal.        Judgment: Judgment normal.     Assessment:  [name] is a 81 y.o. female  who presents today for Colonoscopy for surveillance- phx colon polyps; fhx rectal ca (son-40s)..  Plan:  Colonoscopy with possible intervention today  Colonoscopy with possible biopsy, control of bleeding, polypectomy, and interventions as necessary has been discussed with the patient/patient representative. Informed consent was obtained from the patient/patient representative after explaining the indication, nature, and risks of the procedure including but not limited to death, bleeding, perforation, missed neoplasm/lesions, cardiorespiratory compromise, and reaction to medications. Opportunity for questions was given and appropriate answers were provided. Patient/patient representative has verbalized understanding is amenable to undergoing the procedure.   [name], DO  [facility]  Portions of the record may have been created with voice recognition software. Occasional wrong-word or 'sound-a-like' substitutions may have occurred due to the inherent limitations of voice recognition software.  Read the chart carefully and recognize, using context, where substitutions may have occurred.

## 2021-04-19 NOTE — Anesthesia Preprocedure Evaluation (Signed)
Anesthesia Evaluation  Patient identified by MRN, date of birth, ID band Patient awake    Reviewed: Allergy & Precautions, H&P , NPO status , Patient's Chart, lab work & pertinent test results, reviewed documented beta blocker date and time   History of Anesthesia Complications Negative for: history of anesthetic complications  Airway Mallampati: IV  TM Distance: >3 FB Neck ROM: full    Dental  (+) Partial Upper, Caps, Dental Advidsory Given, Teeth Intact   Pulmonary shortness of breath, sleep apnea and Continuous Positive Airway Pressure Ventilation , COPD, neg recent URI, former smoker,    Pulmonary exam normal breath sounds clear to auscultation       Cardiovascular Exercise Tolerance: Good hypertension, (-) angina+ CAD, + Past MI, + Cardiac Stents and +CHF  (-) CABG + dysrhythmias Atrial Fibrillation + pacemaker (-) Valvular Problems/Murmurs Rhythm:regular Rate:Normal     Neuro/Psych negative neurological ROS  negative psych ROS   GI/Hepatic Neg liver ROS, hiatal hernia, GERD  ,  Endo/Other  diabetesHypothyroidism   Renal/GU CRFRenal disease  negative genitourinary   Musculoskeletal   Abdominal   Peds  Hematology negative hematology ROS (+)   Anesthesia Other Findings Past Medical History: No date: Age-related macular degeneration, dry, right eye No date: Age-related macular degeneration, wet, left eye (HCC) No date: Arthritis     Comment:  "all over; particularly in hands/fingers; all my joints"              (10/05/2017) No date: Atrial fibrillation (HCC) No date: CAD (coronary artery disease) No date: CHF (congestive heart failure) (HCC)     Comment:   A-Fib No date: Chronic kidney disease No date: COPD (chronic obstructive pulmonary disease) ([place])     Comment:  "very mild" (10/05/2017) No date: Family history of adverse reaction to anesthesia     Comment:  "daughter w/PONV & woke up during endoscopy"  (10/05/2017) No date: GERD (gastroesophageal reflux disease) No date: Gout No date: Graves' disease     Comment:  S/P "radioactive tx" No date: Heart disease No date: History of blood transfusion     Comment:  "related to shoulder replacement"  No date: History of gout     Comment:  "no longer on daily RX" (10/05/2017) No date: History of hiatal hernia No date: Hyperlipidemia No date: Hypertension No date: Hypocalcemia No date: Hypokalemia No date: Hypothyroidism No date: Macular degeneration ~ 2012: Melanoma ([place])     Comment:  "off my back" (10/05/2017) No date: Myocardial infarction [facility])     Comment:  "was told I'd had one; don't really know when"               (10/05/2017) 2020: Orthostatic hypotension No date: OSA on CPAP No date: Pacemaker 10/2015: Presence of permanent cardiac pacemaker No date: Renal disorder 04/2017: Scarring of lung     Comment:  "found by radiology" (10/05/2017) No date: Sinus node dysfunction (HCC) No date: Type II diabetes mellitus ([place]) No date: Vitamin B12 deficiency No date: Vitamin D deficiency   Reproductive/Obstetrics negative OB ROS                             Anesthesia Physical  Anesthesia Plan  ASA: 4  Anesthesia Plan: General   Post-op Pain Management:    Induction: Intravenous  PONV Risk Score and Plan: TIVA and Propofol infusion  Airway Management Planned: Natural Airway and Nasal Cannula  Additional Equipment:   Intra-op Plan:  Post-operative Plan:   Informed Consent: I have reviewed the patients History and Physical, chart, labs and discussed the procedure including the risks, benefits and alternatives for the proposed anesthesia with the patient or authorized representative who has indicated his/her understanding and acceptance.     Dental Advisory Given  Plan Discussed with: Anesthesiologist, CRNA and Surgeon  Anesthesia Plan Comments:         Anesthesia Quick Evaluation

## 2021-04-19 NOTE — Transfer of Care (Signed)
Immediate Anesthesia Transfer of Care Note  Patient: Dawn Garcia Render  Procedure(s) Performed: COLONOSCOPY WITH PROPOFOL  Patient Location: PACU and Endoscopy Unit  Anesthesia Type:General  Level of Consciousness: drowsy  Airway & Oxygen Therapy: Patient Spontanous Breathing  Post-op Assessment: Report given to RN and Post -op Vital signs reviewed and stable  Post vital signs: Reviewed and stable  Last Vitals:  Vitals Value Taken Time  BP 102/79 04/19/21 0830  Temp 36.2 C 04/19/21 0829  Pulse 74 04/19/21 0835  Resp 12 04/19/21 0835  SpO2 98 % 04/19/21 0835  Vitals shown include unvalidated device data.  Last Pain:  Vitals:   04/19/21 0829  TempSrc: Temporal  PainSc: 0-No pain         Complications: No notable events documented.

## 2021-04-19 NOTE — Interval H&P Note (Signed)
History and Physical Interval Note: Preprocedure H&P from 04/19/21  was reviewed and there was no interval change after seeing and examining the patient.  Written consent was obtained from the patient after discussion of risks, benefits, and alternatives. 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  The various methods of treatment have been discussed with the patient and family. After consideration of risks, benefits and other options for treatment, the patient has consented to  Procedure(s) with comments: COLONOSCOPY WITH PROPOFOL (N/A) - IDDM as a surgical intervention.  The patient's history has been reviewed, patient examined, no change in status, stable for surgery.  I have reviewed the patient's chart and labs.  Questions were answered to the patient's satisfaction.     [name]

## 2021-04-20 ENCOUNTER — Other Ambulatory Visit: Payer: Self-pay | Admitting: Internal Medicine

## 2021-04-20 ENCOUNTER — Encounter: Payer: Self-pay | Admitting: Internal Medicine

## 2021-04-20 ENCOUNTER — Encounter: Payer: Self-pay | Admitting: Gastroenterology

## 2021-04-20 DIAGNOSIS — E1121 Type 2 diabetes mellitus with diabetic nephropathy: Secondary | ICD-10-CM

## 2021-04-20 LAB — SURGICAL PATHOLOGY

## 2021-04-20 MED ORDER — DEXCOM G6 TRANSMITTER MISC: 3 refills | Status: DC

## 2021-04-20 MED ORDER — FLUCONAZOLE 150 MG PO TABS: 150.0000 mg | ORAL_TABLET | Freq: Once | ORAL | 1 refills | Status: AC

## 2021-04-20 NOTE — Anesthesia Postprocedure Evaluation (Signed)
Anesthesia Post Note  Patient: Makaylie Dedeaux Schrader  Procedure(s) Performed: COLONOSCOPY WITH PROPOFOL  Patient location during evaluation: Endoscopy Anesthesia Type: General Level of consciousness: awake and alert Pain management: pain level controlled Vital Signs Assessment: post-procedure vital signs reviewed and stable Respiratory status: spontaneous breathing, nonlabored ventilation, respiratory function stable and patient connected to nasal cannula oxygen Cardiovascular status: blood pressure returned to baseline and stable Postop Assessment: no apparent nausea or vomiting Anesthetic complications: no   No notable events documented.   Last Vitals:  Vitals:   04/19/21 0849 04/19/21 0859  BP: 132/66 136/66  Pulse: 61 (!) 54  Resp: 14 13  Temp:    SpO2: 100% 100%    Last Pain:  Vitals:   04/19/21 0859  TempSrc:   PainSc: 0-No pain                 Martha Clan

## 2021-04-30 ENCOUNTER — Other Ambulatory Visit: Payer: Self-pay

## 2021-04-30 ENCOUNTER — Ambulatory Visit: Payer: Medicare Other | Admitting: Internal Medicine

## 2021-04-30 ENCOUNTER — Encounter: Payer: Self-pay | Admitting: Internal Medicine

## 2021-04-30 ENCOUNTER — Other Ambulatory Visit: Payer: Self-pay | Admitting: *Deleted

## 2021-04-30 VITALS — BP 130/76 | HR 56 | Ht 62.0 in | Wt 178.0 lb

## 2021-04-30 DIAGNOSIS — I25118 Atherosclerotic heart disease of native coronary artery with other forms of angina pectoris: Secondary | ICD-10-CM

## 2021-04-30 DIAGNOSIS — I10 Essential (primary) hypertension: Secondary | ICD-10-CM

## 2021-04-30 DIAGNOSIS — I480 Paroxysmal atrial fibrillation: Secondary | ICD-10-CM

## 2021-04-30 DIAGNOSIS — E785 Hyperlipidemia, unspecified: Secondary | ICD-10-CM

## 2021-04-30 DIAGNOSIS — I5032 Chronic diastolic (congestive) heart failure: Secondary | ICD-10-CM | POA: Diagnosis not present

## 2021-04-30 DIAGNOSIS — I272 Pulmonary hypertension, unspecified: Secondary | ICD-10-CM | POA: Insufficient documentation

## 2021-04-30 DIAGNOSIS — E1169 Type 2 diabetes mellitus with other specified complication: Secondary | ICD-10-CM

## 2021-04-30 DIAGNOSIS — I495 Sick sinus syndrome: Secondary | ICD-10-CM

## 2021-04-30 MED ORDER — REPATHA 140 MG/ML ~~LOC~~ SOSY: 1.0000 mL | PREFILLED_SYRINGE | SUBCUTANEOUS | 1 refills | Status: AC

## 2021-04-30 MED ORDER — RANOLAZINE ER 500 MG PO TB12: 500.0000 mg | ORAL_TABLET | Freq: Two times a day (BID) | ORAL | 0 refills | Status: AC

## 2021-04-30 MED ORDER — RANOLAZINE ER 500 MG PO TB12
500.0000 mg | ORAL_TABLET | Freq: Two times a day (BID) | ORAL | 0 refills | Status: DC
Start: 2021-04-30 — End: 2021-04-30

## 2021-04-30 NOTE — Patient Instructions (Signed)
Medication Instructions:   Your physician has recommended you make the following change in your medication:   START Ranexa 500 mg TWICE daily   *If you need a refill on your cardiac medications before your next appointment, please call your pharmacy*   Lab Work:  None ordered  Testing/Procedures:  None ordered   Follow-Up: At [facility], you and your health needs are our priority.  As part of our continuing mission to provide you with exceptional heart care, we have created designated Provider Care Teams.  These Care Teams include your primary Cardiologist (physician) and Advanced Practice Providers (APPs -  Physician Assistants and Nurse Practitioners) who all work together to provide you with the care you need, when you need it.  We recommend signing up for the patient portal called "MyChart".  Sign up information is provided on this After Visit Summary.  MyChart is used to connect with patients for Virtual Visits (Telemedicine).  Patients are able to view lab/test results, encounter notes, upcoming appointments, etc.  Non-urgent messages can be sent to your provider as well.   To learn more about what you can do with MyChart, go to [email or web address].    Your next appointment:   3 week(s)  The format for your next appointment:   In Person  Provider:   Nelva Bush, MD

## 2021-04-30 NOTE — Progress Notes (Signed)
Follow-up Outpatient Visit Date: 04/30/2021  Primary Care Provider: Perrin Maltese, MD [place] [address]  Chief Complaint: Chest pain and shortness of breath  HPI:  Dawn Garcia is a 81 y.o. female with history of coronary artery disease with remote PCI, paroxysmal atrial fibrillation status post ablation x2, sick sinus syndrome status post pacemaker, chronic HFpEF, hyperlipidemia, CKD, COPD, and obstructive sleep apnea, who presents for follow-up of HFpEF and mild pulmonary hypertension as well as coronary artery disease and atrial fibrillation.  She was last seen 2 weeks ago by Dawn Glenn, PA, for preop evaluation in anticipation of colonoscopy.  She continued to complain of stable sporadic chest pain.  Today, Dawn Garcia complains of ongoing chest pain and shortness of breath that has bothered her for years.  She believes it is due to pulmonary hypertension and she is frustrated that her providers have not yet instituted pulmonary hypertension specific therapy or referred her to a [facility] specialist.  Right and left heart catheterization last summer showed mild PH with a mean PAP of 26 mmHg.  She notes tightness in her chest, especially on the right side, as well as shortness of breath with modest activity.  Sometimes she even feels this at rest.  At times, her pain is so severe that she feels like she might pass out.  She has not had much edema.  She also denies orthopnea but notes that she has to wear her CPAP in order to lie flat.  [name] notes some headaches when she takes nitroglycerin.  She also had headaches with long-acting nitrates in the past, though she does not recall how severe they were.  She has not tolerated beta-blockers due to associated fatigue.  She is currently in the process of packing up her house, as she Dawn be moving to [place] in 1 month to be closer to family following the recent death of her  husband.  --------------------------------------------------------------------------------------------------  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Dawn Garcia)    Arteriosclerosis of coronary artery in patient with history of myocardial infarction 07/02/2017   Arthritis    "all over; particularly in hands/fingers; all my joints" (10/05/2017)   Atrial fibrillation (Dawn Garcia)    B12 deficiency 09/10/2015   CAD (coronary artery disease)    CHF (congestive heart failure) (Dawn Garcia)     A-Fib   Chronic kidney disease    COPD (chronic obstructive pulmonary disease) (Dawn Garcia)    "very mild" (10/05/2017)   Family history of adverse reaction to anesthesia    "daughter w/PONV & woke up during endoscopy" (10/05/2017)   GERD (gastroesophageal reflux disease)    Gout    Graves' disease    S/P "radioactive tx"   Heart disease    History of blood transfusion    "related to shoulder replacement"    History of gout    "no longer on daily RX" (10/05/2017)   History of hiatal hernia    Hyperlipidemia    Hypertension    Hypocalcemia    Hypokalemia    Hypothyroidism    Macular degeneration    Melanoma (Dawn Garcia) ~ 2012   "off my back" (10/05/2017)   Myocardial infarction [facility]    "was told I'd had one; don't really know when" (10/05/2017)   Orthostatic hypotension 2020   OSA on CPAP    Pacemaker    Presence of permanent cardiac 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  Renal disorder    Scarring of lung 04/2017   "found by radiology" (10/05/2017)   Sinus node dysfunction (HCC)    Type II diabetes mellitus (Dawn Garcia)    Vitamin B12 deficiency    Vitamin D deficiency    Past Surgical History:  Procedure Laterality Date   APPENDECTOMY     ATRIAL FIBRILLATION ABLATION N/A 10/05/2017   Procedure: ATRIAL FIBRILLATION ABLATION;  Surgeon: Dawn Haw, MD;  Location: [facility];  Service: Cardiovascular;  Laterality: N/A;   ATRIAL  FIBRILLATION ABLATION  ~ 2014   CARDIAC CATHETERIZATION  04/2017   CARDIOVERSION N/A 06/22/2020   Procedure: CARDIOVERSION;  Surgeon: Nelva Bush, MD;  Location: [facility] ORS;  Service: Cardiovascular;  Laterality: N/A;   COLONOSCOPY W/ BIOPSIES AND POLYPECTOMY  2015   1 polyp removed- repeat 3 years   COLONOSCOPY WITH PROPOFOL N/A 04/19/2021   Procedure: COLONOSCOPY WITH PROPOFOL;  Surgeon: [name], DO;  Location: [facility] ENDOSCOPY;  Service: Gastroenterology;  Laterality: N/A;  IDDM   CORONARY ANGIOPLASTY WITH STENT PLACEMENT  ~ 2014   EP IMPLANTABLE DEVICE N/A 11/10/2015   Procedure: Pacemaker Implant;  Surgeon: Dawn [name], MD;  Location: [place] [facility];  Service: Cardiovascular;  Laterality: N/A;   EP IMPLANTABLE DEVICE N/A 11/11/2015   Procedure: Lead Revision/Repair;  Surgeon: Dawn Lance, MD;  Location: [place] [facility];  Service: Cardiovascular;  Laterality: N/A;   INGUINAL HERNIA REPAIR Left 1951   JOINT REPLACEMENT     LAPAROSCOPIC CHOLECYSTECTOMY     MELANOMA EXCISION  ~ 2012   "off my back" (10/05/2017)   PARATHYROIDECTOMY     "removed 3 glands; still have 1 gland" (10/05/2017)   RIGHT/LEFT HEART CATH AND CORONARY ANGIOGRAPHY N/A 05/03/2017   Procedure: RIGHT/LEFT HEART CATH AND CORONARY ANGIOGRAPHY;  Surgeon: Dawn Man, MD;  Location: [place] [facility];  Service: Cardiovascular;  Laterality: N/A;   RIGHT/LEFT HEART CATH AND CORONARY ANGIOGRAPHY N/A 09/18/2020   Procedure: RIGHT/LEFT HEART CATH AND CORONARY ANGIOGRAPHY;  Surgeon: Nelva Bush, MD;  Location: [place] [facility];  Service: Cardiovascular;  Laterality: N/A;   TONSILLECTOMY     TOTAL SHOULDER ARTHROPLASTY Right x 2   VAGINAL HYSTERECTOMY     "total"    Current Meds  Medication Sig   acetaminophen (TYLENOL) 650 MG CR tablet Take 650 mg by mouth at bedtime.   amLODipine (NORVASC) 5 MG tablet Take 1 tablet (5 mg total) by mouth daily.   apixaban (ELIQUIS) 5 MG TABS tablet TAKE  1 TABLET(5 MG) BY MOUTH TWICE DAILY   Ascorbic Acid (VITAMIN C PO) Take 500 mg by mouth daily.   BD PEN NEEDLE NANO 2ND GEN 32G X 4 MM MISC USE TO INJECT INTO THE SKIN DAILY   carboxymethylcellulose (REFRESH PLUS) 0.5 % SOLN Place 1 drop into both eyes daily as needed (dry eyes).   Cholecalciferol (VITAMIN D3) 5000 units TABS Take 5,000 Units by mouth every morning.   Continuous Blood Gluc Receiver (DEXCOM G6 RECEIVER) DEVI Use as instructed to check blood sugar daily.   Continuous Blood Gluc Sensor (DEXCOM G6 SENSOR) MISC Use as instructed to check blood sugar daily   Continuous Blood Gluc Transmit (DEXCOM G6 TRANSMITTER) MISC Use as instructed to check blood sugar.   dapagliflozin propanediol (FARXIGA) 5 MG TABS tablet Take 1 tablet (5 mg total) by mouth daily before breakfast.   diltiazem (CARDIZEM) 30 MG tablet Take 1 tablet every 4 hours AS NEEDED for heart rate >  100   Evolocumab (REPATHA) 140 MG/ML SOSY Inject 1 mL into the skin every 14 (fourteen) days.   fenofibrate 54 MG tablet Take 54 mg by mouth daily.   glucose blood test strip Use as instructed to check blood sugar 2 times daily   insulin degludec (TRESIBA FLEXTOUCH) 100 UNIT/ML FlexTouch Pen Inject 8-10 Units into the skin daily.   Insulin Syringe-Needle U-100 (INSULIN SYRINGE .3CC/31GX5/16") 31G X 5/16" 0.3 ML MISC Use to inject insulin twice daily   levothyroxine (SYNTHROID) 112 MCG tablet Take 1 tablet (112 mcg total) by mouth daily.   losartan (COZAAR) 50 MG tablet Take 1 tablet (50 mg total) by mouth daily.   Multiple Vitamins-Minerals (ICAPS AREDS 2) CAPS Take 1 capsule by mouth 2 (two) times daily.   nitroGLYCERIN (NITROSTAT) 0.4 MG SL tablet Place 1 tablet (0.4 mg total) under the tongue every 5 (five) minutes as needed for chest pain.   pantoprazole (PROTONIX) 40 MG tablet Take 1 tablet (40 mg total) by mouth daily.   polyethylene glycol (MIRALAX / GLYCOLAX) 17 g packet Take 17 g by mouth daily as needed for moderate  constipation.   potassium chloride (KLOR-CON) 10 MEQ tablet Take 1.5 tablets (15 mEq total) by mouth daily.   Semaglutide, 2 MG/DOSE, (OZEMPIC, 2 MG/DOSE,) 8 MG/3ML SOPN Inject 2 mg into the skin once a week.   torsemide (DEMADEX) 20 MG tablet Take 1.5 tablets (30 mg total) by mouth daily.   vitamin B-12 (CYANOCOBALAMIN) 500 MCG tablet Take 500 mcg by mouth daily.    Allergies: Demerol [meperidine], Epinephrine, Multaq [dronedarone], Sulfa antibiotics, Tetracyclines & related, Quinolones, Statins, and Imdur [isosorbide nitrate]  Social History   Tobacco Use   Smoking status: Former    Packs/day: 2.00    Years: 3.00    Pack years: 6.00    Types: Cigarettes    Quit date: 09/25/1968    Years since quitting: 52.6   Smokeless tobacco: Never   Tobacco comments:    10/05/2017 "smoked in my 20's"  Vaping Use   Vaping Use: Never used  Substance Use Topics   Alcohol use: Not Currently    Comment: 10/05/2017 "1 mixed drink q 2-3 months"   Drug use: Never    Family History  Problem Relation Age of Onset   Thyroid disease Mother 56       3 days after surgery   Heart defect Father 63       ?ascending aortic aneurysm   Breast cancer Sister    Hepatitis Brother    Diabetes Paternal Uncle    Dementia Paternal Uncle    Colon cancer Maternal Grandmother    Diabetes Maternal Grandmother    Diabetes Maternal Grandfather    Colon cancer Paternal Grandmother        68's   Rectal cancer Son 49   Breast cancer Half-Sister    Colon polyps Neg Hx    Stomach cancer Neg Hx    Liver cancer Neg Hx    Pancreatic cancer Neg Hx     Review of Systems: A 12-system review of systems was performed and was negative except as noted in the HPI.  --------------------------------------------------------------------------------------------------  Physical Exam: BP 130/76 (BP Location: Left Arm, Patient Position: Sitting, Cuff Size: Large)    Pulse (!) 56    Ht 5\' 2"  (1.575 m)    Wt 178 lb (80.7 kg)     SpO2 98%    BMI 32.56 kg/m   General:  NAD. Neck: No JVD  or HJR. Lungs: Clear to auscultation bilaterally without wheezes or crackles. Heart: Regular rate and rhythm without murmurs, rubs, or gallops. Abdomen: Soft, nontender, nondistended. Extremities: No lower extremity edema.  EKG: Atrially sensed rhythm with inferior and anterolateral T wave inversions.  Compared with prior tracings, ventricular pacing is no longer present.  Lab Results  Component Value Date   WBC 5.5 08/31/2020   HGB 13.6 08/31/2020   HCT 40.7 08/31/2020   MCV 87 08/31/2020   PLT 211 08/31/2020    Lab Results  Component Value Date   NA 145 (H) 11/20/2020   K 4.4 11/20/2020   CL 106 11/20/2020   CO2 26 11/20/2020   BUN 16 11/20/2020   CREATININE 1.31 (H) 11/20/2020   GLUCOSE 105 (H) 11/20/2020   ALT 13 08/20/2020    Lab Results  Component Value Date   CHOL 219 (H) 08/20/2020   HDL 41 08/20/2020   LDLCALC 124 (H) 08/20/2020   LDLDIRECT 63 09/17/2019   TRIG 269 (H) 08/20/2020   CHOLHDL 5.3 08/20/2020    --------------------------------------------------------------------------------------------------  ASSESSMENT AND PLAN: Coronary artery disease with stable angina: Ms. Poe has a long history of chest pain and shortness of breath with minimal activity and sometimes even at rest.  Her catheterization in 09/2020 showed significant single-vessel coronary artery disease with approximately 70% stenosis in a small, bifurcating D2 branch that appeared similar to her prior catheterization in 2019.  There was otherwise nonobstructive disease involving the LAD and previously stented LCx/OM.  EKG today shows anterolateral and inferior T wave inversions, which were not present on prior tracings that showed atrial and ventricularly paced rhythm.  T wave changes could represent memory T waves.  Her only antianginal therapy at this time is amlodipine.  She has been intolerant to beta-blockers and long-acting  nitrates in the past.  We have therefore agreed to a trial of ranolazine 500 mg twice daily.  I think it is more likely that her dyspnea and chest pain are related to her coronary disease as opposed to pulmonary hypertension.  However, if symptoms do not improve, consultation with a heart failure/pulmonary hypertension specialist in [place], where she is moving next month, may be helpful.  Chronic HFpEF and pulmonary hypertension: Ms. Hackmann appears grossly euvolemic on exam.  Right heart catheterization in 09/2020 showed upper normal left heart and mildly elevated right heart and pulmonary artery pressures.  Her dyspnea and chest discomfort seem out of proportion to the degree of heart failure and pulmonary hypertension.  I think it is reasonable to continue her current doses of torsemide and losartan.  Escalation of dapagliflozin to 10 mg daily could be considered for concurrent DM and HFpEF therapy.  I Dawn defer this medication change to Ms. Tooker's PCP and/or endocrinologist.  As above, consultation with a heart failure/PH specialist in [place] may be helpful if her symptoms persist.  Paroxysmal atrial fibrillation: No evidence of intercurrent atrial fibrillation.  Continue apixaban and as needed diltiazem.  Sick sinus syndrome: Ongoing follow-up through device clinic, which Dawn need to be transitioned to [place] when she moves there next month.  Hyperlipidemia associated with type 2 diabetes mellitus: LDL suboptimally controlled on last check in 08/2020.  For now, we Dawn plan to continue evolocumab given statin intolerance.  If LDL remains above 70, addition of ezetimibe or bempedoic acid Dawn need to be considered.  Continue to work on lifestyle modifications to help lower triglycerides.  Trial of Vascepa should also be considered in the future.  Hypertension: Blood pressure borderline today.  We Dawn defer changes to her blood pressure regimen at this time.  Follow-up: Return to clinic in 3  weeks.  Nelva Bush, MD 04/30/2021 9:38 AM

## 2021-05-05 NOTE — Addendum Note (Signed)
Addended by: Raelene Bott, Dajiah Kooi L on: 05/05/2021 12:17 PM   Modules accepted: Orders

## 2021-05-06 ENCOUNTER — Encounter: Payer: Self-pay | Admitting: Internal Medicine

## 2021-05-18 ENCOUNTER — Encounter: Payer: Self-pay | Admitting: Internal Medicine

## 2021-05-18 ENCOUNTER — Other Ambulatory Visit: Payer: Self-pay | Admitting: Internal Medicine

## 2021-05-18 ENCOUNTER — Other Ambulatory Visit: Payer: Self-pay

## 2021-05-18 ENCOUNTER — Ambulatory Visit: Payer: Medicare Other | Admitting: Internal Medicine

## 2021-05-18 VITALS — BP 120/78 | HR 58 | Ht 62.0 in | Wt 178.0 lb

## 2021-05-18 DIAGNOSIS — I5032 Chronic diastolic (congestive) heart failure: Secondary | ICD-10-CM | POA: Diagnosis not present

## 2021-05-18 DIAGNOSIS — I951 Orthostatic hypotension: Secondary | ICD-10-CM

## 2021-05-18 DIAGNOSIS — Z950 Presence of cardiac pacemaker: Secondary | ICD-10-CM

## 2021-05-18 DIAGNOSIS — I495 Sick sinus syndrome: Secondary | ICD-10-CM | POA: Diagnosis not present

## 2021-05-18 DIAGNOSIS — I480 Paroxysmal atrial fibrillation: Secondary | ICD-10-CM | POA: Diagnosis not present

## 2021-05-18 DIAGNOSIS — E876 Hypokalemia: Secondary | ICD-10-CM

## 2021-05-18 NOTE — Progress Notes (Signed)
ELECTROPHYSIOLOGY OFFICE  NOTE  Patient ID: Dawn Garcia, MRN: [id number], DOB/AGE: [birth year] 81 y.o. Admit date: (Not on file) Date of Consult: 05/18/2021  Primary Physician: Perrin Maltese, MD Primary Cardiologist: TG      HPI Dawn Garcia is a 81 y.o. female  Seen in followup for pacing by Dr. 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  Hx of atrial fibrillation for which she underwent  PVI 2013 and repeat 7/19. anticoagulation with Apixaban    LH and orthostatic tachycardia assoc with Orthostatic hypotension treated with midodrine,  adjunctive mestinon and changes in her diuretics.   Thromboembolic risk factors ( age  -2, DM-1, Vasc disease -1, CHF-1, Gender-1) for a CHADSVASc Score of 6  Seen by Dr. Bethann Humble 2/23 reviewing her complaints of chest pain and shortness of breath for which the patient had concerns regarding pulmonary hypertension.  He noted that her mean PA pressure was 26 mm With T wave changes at that visit, which he accurately considered Q waves memory, and with her intolerance of beta-blockers, he started her on ranolazine.   He also notes that she is moving to [place] EF   6/17    Echo  65 %   11/17    Myoview   54 % Anteroapical defect  8/18 Echo   50-55%  possible wall motion abnormality-reviewed by TG no further workup necessary  2/19 Cath    moderate mid-to distal LAD with patent circumflex stent//LVEDP 8//RA 11-notably patient was in sinus rhythm  2/19` Echo    50-55%   5/22 Echo  50-55% LVH  6/22 Myoview  57% Prior infarct  7/22 LHC  1 V CAD D1-70%    Cx Stent ISR 30-40%     Date Cr K TSH Hgb  12/17      12/18 1.19 3.9     1/19 1.69 3.1  16.4  2/19 1.69 4.3    7/19 1.36 3.9  12.6  1/20   0.34   8/20 0.9 3.5    5/21   1.27   6/22 1.23 4.3 2.05(9/22) 14.2 (4/22)          Past Medical History:  Diagnosis Date   Age-related macular degeneration, dry, right eye     Age-related macular degeneration, wet, left eye (HCC)    Arteriosclerosis of coronary artery in patient with history of myocardial infarction 07/02/2017   Arthritis    "all over; particularly in hands/fingers; all my joints" (10/05/2017)   Atrial fibrillation ([place])    B12 deficiency 09/10/2015   CAD (coronary artery disease)    CHF (congestive heart failure) (HCC)     A-Fib   Chronic kidney disease    COPD (chronic obstructive pulmonary disease) ([place])    "very mild" (10/05/2017)   Family history of adverse reaction to anesthesia    "daughter w/PONV & woke up during endoscopy" (10/05/2017)   GERD (gastroesophageal reflux disease)    Gout    Graves' disease    S/P "radioactive tx"   Heart disease    History of blood transfusion    "related to shoulder replacement"    History of gout    "no longer on daily RX" (10/05/2017)   History of hiatal hernia    Hyperlipidemia    Hypertension    Hypocalcemia    Hypokalemia    Hypothyroidism    Macular degeneration    Melanoma ([place]) ~ 2012   "  off my back" (10/05/2017)   Myocardial infarction [facility])    "was told I'd had one; don't really know when" (10/05/2017)   Orthostatic hypotension 2020   OSA on CPAP    Pacemaker    Presence of permanent cardiac 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   Renal disorder    Scarring of lung 04/2017   "found by radiology" (10/05/2017)   Sinus node dysfunction (HCC)    Type II diabetes mellitus ([place])    Vitamin B12 deficiency    Vitamin D deficiency       Surgical History:  Past Surgical History:  Procedure Laterality Date   APPENDECTOMY     ATRIAL FIBRILLATION ABLATION N/A 10/05/2017   Procedure: ATRIAL FIBRILLATION ABLATION;  Surgeon: Constance Haw, MD;  Location: [place] [facility];  Service: Cardiovascular;  Laterality: N/A;   ATRIAL FIBRILLATION ABLATION  ~ 2014   CARDIAC CATHETERIZATION  04/2017   CARDIOVERSION N/A 06/22/2020   Procedure: CARDIOVERSION;  Surgeon: Nelva Bush, MD;  Location: [facility] ORS;   Service: Cardiovascular;  Laterality: N/A;   COLONOSCOPY W/ BIOPSIES AND POLYPECTOMY  2015   1 polyp removed- repeat 3 years   COLONOSCOPY WITH PROPOFOL N/A 04/19/2021   Procedure: COLONOSCOPY WITH PROPOFOL;  Surgeon: [name], DO;  Location: [facility] ENDOSCOPY;  Service: Gastroenterology;  Laterality: N/A;  IDDM   CORONARY ANGIOPLASTY WITH STENT PLACEMENT  ~ 2014   EP IMPLANTABLE DEVICE N/A 11/10/2015   Procedure: Pacemaker Implant;  Surgeon: Will [name], MD;  Location: [place] [facility];  Service: Cardiovascular;  Laterality: N/A;   EP IMPLANTABLE DEVICE N/A 11/11/2015   Procedure: Lead Revision/Repair;  Surgeon: Evans Lance, MD;  Location: [place] [facility];  Service: Cardiovascular;  Laterality: N/A;   INGUINAL HERNIA REPAIR Left 1951   JOINT REPLACEMENT     LAPAROSCOPIC CHOLECYSTECTOMY     MELANOMA EXCISION  ~ 2012   "off my back" (10/05/2017)   PARATHYROIDECTOMY     "removed 3 glands; still have 1 gland" (10/05/2017)   RIGHT/LEFT HEART CATH AND CORONARY ANGIOGRAPHY N/A 05/03/2017   Procedure: RIGHT/LEFT HEART CATH AND CORONARY ANGIOGRAPHY;  Surgeon: Leonie Man, MD;  Location: [place] [facility];  Service: Cardiovascular;  Laterality: N/A;   RIGHT/LEFT HEART CATH AND CORONARY ANGIOGRAPHY N/A 09/18/2020   Procedure: RIGHT/LEFT HEART CATH AND CORONARY ANGIOGRAPHY;  Surgeon: Nelva Bush, MD;  Location: [place] [facility];  Service: Cardiovascular;  Laterality: N/A;   TONSILLECTOMY     TOTAL SHOULDER ARTHROPLASTY Right x 2   VAGINAL HYSTERECTOMY     "total"     Home Meds:  Current Meds  Medication Sig   acetaminophen (TYLENOL) 650 MG CR tablet Take 650 mg by mouth at bedtime.   amLODipine (NORVASC) 5 MG tablet Take 1 tablet (5 mg total) by mouth daily.   apixaban (ELIQUIS) 5 MG TABS tablet TAKE 1 TABLET(5 MG) BY MOUTH TWICE DAILY   Ascorbic Acid (VITAMIN C PO) Take 500 mg by mouth daily.   BD PEN NEEDLE NANO 2ND GEN 32G X 4 MM MISC USE TO INJECT INTO THE  SKIN DAILY   carboxymethylcellulose (REFRESH PLUS) 0.5 % SOLN Place 1 drop into both eyes daily as needed (dry eyes).   Cholecalciferol (VITAMIN D3) 5000 units TABS Take 5,000 Units by mouth every morning.   Continuous Blood Gluc Receiver (DEXCOM G6 RECEIVER) DEVI Use as instructed to check blood sugar daily.   Continuous Blood Gluc Sensor (DEXCOM G6 SENSOR) MISC Use as instructed to check blood  sugar daily   Continuous Blood Gluc Transmit (DEXCOM G6 TRANSMITTER) MISC Use as instructed to check blood sugar.   dapagliflozin propanediol (FARXIGA) 5 MG TABS tablet Take 1 tablet (5 mg total) by mouth daily before breakfast.   diltiazem (CARDIZEM) 30 MG tablet Take 1 tablet every 4 hours AS NEEDED for heart rate >100   Evolocumab (REPATHA) 140 MG/ML SOSY Inject 1 mL into the skin every 14 (fourteen) days.   fenofibrate 54 MG tablet Take 54 mg by mouth daily.   glucose blood test strip Use as instructed to check blood sugar 2 times daily   insulin degludec (TRESIBA FLEXTOUCH) 100 UNIT/ML FlexTouch Pen Inject 8-10 Units into the skin daily.   Insulin Syringe-Needle U-100 (INSULIN SYRINGE .3CC/31GX5/16") 31G X 5/16" 0.3 ML MISC Use to inject insulin twice daily   levothyroxine (SYNTHROID) 112 MCG tablet Take 1 tablet (112 mcg total) by mouth daily.   losartan (COZAAR) 50 MG tablet Take 1 tablet (50 mg total) by mouth daily.   Multiple Vitamins-Minerals (ICAPS AREDS 2) CAPS Take 1 capsule by mouth 2 (two) times daily.   nitroGLYCERIN (NITROSTAT) 0.4 MG SL tablet Place 1 tablet (0.4 mg total) under the tongue every 5 (five) minutes as needed for chest pain.   pantoprazole (PROTONIX) 40 MG tablet Take 1 tablet (40 mg total) by mouth daily.   polyethylene glycol (MIRALAX / GLYCOLAX) 17 g packet Take 17 g by mouth daily as needed for moderate constipation.   potassium chloride (KLOR-CON) 10 MEQ tablet Take 1.5 tablets (15 mEq total) by mouth daily.   ranolazine (RANEXA) 500 MG 12 hr tablet Take 1 tablet (500  mg total) by mouth 2 (two) times daily.   Semaglutide, 2 MG/DOSE, (OZEMPIC, 2 MG/DOSE,) 8 MG/3ML SOPN Inject 2 mg into the skin once a week.   torsemide (DEMADEX) 20 MG tablet Take 1.5 tablets (30 mg total) by mouth daily.   vitamin B-12 (CYANOCOBALAMIN) 500 MCG tablet Take 500 mcg by mouth daily.      Allergies:  Allergies  Allergen Reactions   Demerol [Meperidine] Nausea And Vomiting    Tolerated Fentanyl 11/10/15   Epinephrine Shortness Of Breath and Palpitations    Powder   Multaq [Dronedarone] Other (See Comments)    Kidney failure and lung problems   Sulfa Antibiotics Anaphylaxis   Tetracyclines & Related Swelling and Other (See Comments)    Made nose, lips,  And tongue itchy   Quinolones Other (See Comments)    Aortic dilation. Avoid FQ, as these are showing to increase Ao dilation.    Statins     Foggy head, episodic amnesia    Imdur [Isosorbide Nitrate]     HA           Physical Exam: BP 120/78 (BP Location: Left Arm, Patient Position: Sitting, Cuff Size: Large)    Pulse (!) 58    Ht '5\' 2"'$  (1.575 m)    Wt 178 lb (80.7 kg)    SpO2 98%    BMI 32.56 kg/m  Well developed and well nourished in no acute distress HENT normal Neck supple with JVP-flat Lungs Clear Device pocket well healed; without hematoma or erythema.  There is no tethering  Regular rate and rhythm, No gallop No  murmur Abd-soft with active BS No Clubbing cyanosis No edema Skin-warm and dry A & Oriented  Grossly normal sensory and motor function  ECG:    Assessment and Plan:   Sick Sinus syndrome  Persistent Atrial Fib with prior  PVI and redo 7/19//Atrial tach  Pacemaker St Jude   OSA  P-wave oversensing resulting false detection of atrial fibrillation  HFpEF   chronic  DOE  Coronary artery disease with prior stenting  Chest pain   Orthostatic intolerance   Pulmonary hypertension-mild     Chest discomfort is much improved on the ranolazine.  We will continue at 500 mg twice  daily. No bleeding.  Continue Eliquis 5 mg twice daily  Blood pressure well controlled.  Continue current medications with losartan, amlodipine.  We discussed her resting rate at 45, we will inactivate resting rate, increase her lower rate limit from 55-60 and increase her upper sensor rate from 90--100.  She will let us know prior to moving to [place] whether she wants to be reprogrammed.  We also discussed the potential benefits of decreasing/inactivating VIP to promote a more appropriate AV timing.  We will defer that to [place].

## 2021-05-18 NOTE — Patient Instructions (Signed)
Medication Instructions:  ?- Your physician recommends that you continue on your current medications as directed. Please refer to the Current Medication list given to you today. ? ?*If you need a refill on your cardiac medications before your next appointment, please call your pharmacy* ? ? ?Lab Work: ?- none ordered ? ?If you have labs (blood work) drawn today and your tests are completely normal, you will receive your results only by: ?MyChart Message (if you have MyChart) OR ?A paper copy in the mail ?If you have any lab test that is abnormal or we need to change your treatment, we will call you to review the results. ? ? ?Testing/Procedures: ?- none ordered ? ? ?Follow-Up: ?At [facility], you and your health needs are our priority.  As part of our continuing mission to provide you with exceptional heart care, we have created designated Provider Care Teams.  These Care Teams include your primary Cardiologist (physician) and Advanced Practice Providers (APPs -  Physician Assistants and Nurse Practitioners) who all work together to provide you with the care you need, when you need it. ? ?We recommend signing up for the patient portal called "MyChart".  Sign up information is provided on this After Visit Summary.  MyChart is used to connect with patients for Virtual Visits (Telemedicine).  Patients are able to view lab/test results, encounter notes, upcoming appointments, etc.  Non-urgent messages can be sent to your provider as well.   ?To learn more about what you can do with MyChart, go to [email or web address].   ? ?Your next appointment:   ?As needed  ? ?The format for your next appointment:   ?In Person ? ?Provider:   ?Virl Axe, MD  ? ? ?Other Instructions ? ?Good luck with your move! It has been our pleasure to care for you. You will be missed!! ? ? ? ?

## 2021-05-19 ENCOUNTER — Other Ambulatory Visit: Payer: Self-pay

## 2021-05-19 ENCOUNTER — Encounter: Payer: Self-pay | Admitting: Internal Medicine

## 2021-05-19 ENCOUNTER — Ambulatory Visit: Payer: Medicare Other | Admitting: Internal Medicine

## 2021-05-19 VITALS — BP 130/84 | HR 61 | Ht 62.0 in | Wt 178.0 lb

## 2021-05-19 DIAGNOSIS — E785 Hyperlipidemia, unspecified: Secondary | ICD-10-CM

## 2021-05-19 DIAGNOSIS — I5032 Chronic diastolic (congestive) heart failure: Secondary | ICD-10-CM | POA: Diagnosis not present

## 2021-05-19 DIAGNOSIS — I25118 Atherosclerotic heart disease of native coronary artery with other forms of angina pectoris: Secondary | ICD-10-CM

## 2021-05-19 DIAGNOSIS — E1169 Type 2 diabetes mellitus with other specified complication: Secondary | ICD-10-CM

## 2021-05-19 DIAGNOSIS — I480 Paroxysmal atrial fibrillation: Secondary | ICD-10-CM

## 2021-05-19 DIAGNOSIS — I272 Pulmonary hypertension, unspecified: Secondary | ICD-10-CM

## 2021-05-19 DIAGNOSIS — I495 Sick sinus syndrome: Secondary | ICD-10-CM

## 2021-05-19 LAB — CUP PACEART INCLINIC DEVICE CHECK
Battery Remaining Longevity: 55 mo
Battery Voltage: 2.99 V
Brady Statistic RA Percent Paced: 69 %
Brady Statistic RV Percent Paced: 52 %
Date Time Interrogation Session: 20230307123300
Implantable Lead Implant Date: 20170829
Implantable Lead Implant Date: 20170829
Implantable Lead Location: 753859
Implantable Lead Location: 753860
Implantable Pulse Generator Implant Date: 20170829
Lead Channel Impedance Value: 425 Ohm
Lead Channel Impedance Value: 587.5 Ohm
Lead Channel Pacing Threshold Amplitude: 0.75 V
Lead Channel Pacing Threshold Amplitude: 0.75 V
Lead Channel Pacing Threshold Amplitude: 0.75 V
Lead Channel Pacing Threshold Amplitude: 0.75 V
Lead Channel Pacing Threshold Pulse Width: 0.4 ms
Lead Channel Pacing Threshold Pulse Width: 0.4 ms
Lead Channel Pacing Threshold Pulse Width: 0.6 ms
Lead Channel Pacing Threshold Pulse Width: 0.6 ms
Lead Channel Sensing Intrinsic Amplitude: 12 mV
Lead Channel Sensing Intrinsic Amplitude: 5 mV
Lead Channel Setting Pacing Amplitude: 1.875
Lead Channel Setting Pacing Amplitude: 2.5 V
Lead Channel Setting Pacing Pulse Width: 0.4 ms
Lead Channel Setting Sensing Sensitivity: 2 mV
Pulse Gen Model: 2272
Pulse Gen Serial Number: 7945290

## 2021-05-19 NOTE — Patient Instructions (Signed)
Medication Instructions:  ? ?Your physician recommends that you continue on your current medications as directed. Please refer to the Current Medication list given to you today. ? ?*If you need a refill on your cardiac medications before your next appointment, please call your pharmacy* ? ? ?Lab Work: ? ?None ordered ? ?Testing/Procedures: ? ?None ordered ? ? ?Follow-Up: ?At [facility], you and your health needs are our priority.  As part of our continuing mission to provide you with exceptional heart care, we have created designated Provider Care Teams.  These Care Teams include your primary Cardiologist (physician) and Advanced Practice Providers (APPs -  Physician Assistants and Nurse Practitioners) who all work together to provide you with the care you need, when you need it. ? ?We recommend signing up for the patient portal called "MyChart".  Sign up information is provided on this After Visit Summary.  MyChart is used to connect with patients for Virtual Visits (Telemedicine).  Patients are able to view lab/test results, encounter notes, upcoming appointments, etc.  Non-urgent messages can be sent to your provider as well.   ?To learn more about what you can do with MyChart, go to [email or web address].   ? ?Your next appointment:   ? ?You may follow up as needed ?

## 2021-05-19 NOTE — Progress Notes (Signed)
Follow-up Outpatient Visit Date: 05/19/2021  Primary Care Provider: Perrin Maltese, MD [place] [address]  Chief Complaint: Follow-up shortness of breath and chest pain  HPI:  Dawn Garcia is a 80 y.o. female with history of coronary artery disease with remote PCI, paroxysmal atrial fibrillation status post ablation x2, sick sinus syndrome status post pacemaker, chronic HFpEF, hyperlipidemia, CKD, COPD, and obstructive sleep apnea, who presents for follow-up of HFpEF and mild pulmonary hypertension as well as CAD and atrial fibrillation.  I last saw her approximately 2 weeks ago, at which time she was frustrated by ongoing chest pain and shortness of breath that have bothered her for years.  We discussed that her catheterization findings from the summer were not consistent with significant epicardial CAD or pulmonary hypertension leading to her symptoms.  We agreed to a trial of ranolazine for possible microvascular dysfunction.  She reached out to [place] a week later, concerned about low heart rates at home.  She saw Dr. Caryl Comes yesterday, who reprogrammed her pacemaker to increase lower rate limit to 60 bpm with an activation of resting rate setting.  She noted yesterday that her chest pain had improved with ranolazine.  Today, Dawn Garcia reports that she feels notably better with addition of ranolazine.  It took about a week for her to notice the effects but she now feels like her chest pain and shortness of breath are much better.  She has noted some constipation since starting the medication.  She has intermittent lightheadedness, usually when standing up.  She has not fallen.  She is trying to stay well-hydrated.  This predates addition of ranolazine.  She is planning to move to [place] at the [place] of next week and has already reached out to her cardiology practice there for ongoing  care.  --------------------------------------------------------------------------------------------------  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[place])    Arteriosclerosis of coronary artery in patient with history of myocardial infarction 07/02/2017   Arthritis    "all over; particularly in hands/fingers; all my joints" (10/05/2017)   Atrial fibrillation ([place])    B12 deficiency 09/10/2015   CAD (coronary artery disease)    CHF (congestive heart failure) ([facility])     A-Fib   Chronic kidney disease    COPD (chronic obstructive pulmonary disease) ([place])    "very mild" (10/05/2017)   Family history of adverse reaction to anesthesia    "daughter w/PONV & woke up during endoscopy" (10/05/2017)   GERD (gastroesophageal reflux disease)    Gout    Graves' disease    S/P "radioactive tx"   Heart disease    History of blood transfusion    "related to shoulder replacement"    History of gout    "no longer on daily RX" (10/05/2017)   History of hiatal hernia    Hyperlipidemia    Hypertension    Hypocalcemia    Hypokalemia    Hypothyroidism    Macular degeneration    Melanoma ([place]) ~ 2012   "off my back" (10/05/2017)   Myocardial infarction [facility])    "was told I'd had one; don't really know when" (10/05/2017)   Orthostatic hypotension 2020   OSA on CPAP    Pacemaker    Presence of permanent cardiac 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   Renal disorder    Scarring of lung 04/2017   "found by radiology" (10/05/2017)   Sinus node dysfunction (  [place])    Type II diabetes mellitus ([place])    Vitamin B12 deficiency    Vitamin D deficiency    Past Surgical History:  Procedure Laterality Date   APPENDECTOMY     ATRIAL FIBRILLATION ABLATION N/A 10/05/2017   Procedure: ATRIAL FIBRILLATION ABLATION;  Surgeon: Constance Haw, MD;  Location: [place] [facility];  Service: Cardiovascular;  Laterality: N/A;   ATRIAL  FIBRILLATION ABLATION  ~ 2014   CARDIAC CATHETERIZATION  04/2017   CARDIOVERSION N/A 06/22/2020   Procedure: CARDIOVERSION;  Surgeon: Nelva Bush, MD;  Location: [facility] ORS;  Service: Cardiovascular;  Laterality: N/A;   COLONOSCOPY W/ BIOPSIES AND POLYPECTOMY  2015   1 polyp removed- repeat 3 years   COLONOSCOPY WITH PROPOFOL N/A 04/19/2021   Procedure: COLONOSCOPY WITH PROPOFOL;  Surgeon: [name], DO;  Location[facility] ENDOSCOPY;  Service: Gastroenterology;  Laterality: N/A;  IDDM   CORONARY ANGIOPLASTY WITH STENT PLACEMENT  ~ 2014   EP IMPLANTABLE DEVICE N/A 11/10/2015   Procedure: Pacemaker Implant;  Surgeon: Will [name], MD;  Location: [place] [facility];  Service: Cardiovascular;  Laterality: N/A;   EP IMPLANTABLE DEVICE N/A 11/11/2015   Procedure: Lead Revision/Repair;  Surgeon: Evans Lance, MD;  Location: [place] [facility];  Service: Cardiovascular;  Laterality: N/A;   INGUINAL HERNIA REPAIR Left 1951   JOINT REPLACEMENT     LAPAROSCOPIC CHOLECYSTECTOMY     MELANOMA EXCISION  ~ 2012   "off my back" (10/05/2017)   PARATHYROIDECTOMY     "removed 3 glands; still have 1 gland" (10/05/2017)   RIGHT/LEFT HEART CATH AND CORONARY ANGIOGRAPHY N/A 05/03/2017   Procedure: RIGHT/LEFT HEART CATH AND CORONARY ANGIOGRAPHY;  Surgeon: Leonie Man, MD;  Location: [place] [facility];  Service: Cardiovascular;  Laterality: N/A;   RIGHT/LEFT HEART CATH AND CORONARY ANGIOGRAPHY N/A 09/18/2020   Procedure: RIGHT/LEFT HEART CATH AND CORONARY ANGIOGRAPHY;  Surgeon: Nelva Bush, MD;  Location: [place] [facility];  Service: Cardiovascular;  Laterality: N/A;   TONSILLECTOMY     TOTAL SHOULDER ARTHROPLASTY Right x 2   VAGINAL HYSTERECTOMY     "total"    Current Meds  Medication Sig   acetaminophen (TYLENOL) 650 MG CR tablet Take 650 mg by mouth at bedtime.   amLODipine (NORVASC) 5 MG tablet Take 1 tablet (5 mg total) by mouth daily.   apixaban (ELIQUIS) 5 MG TABS tablet TAKE  1 TABLET(5 MG) BY MOUTH TWICE DAILY   Ascorbic Acid (VITAMIN C PO) Take 500 mg by mouth daily.   BD PEN NEEDLE NANO 2ND GEN 32G X 4 MM MISC USE TO INJECT INTO THE SKIN DAILY   carboxymethylcellulose (REFRESH PLUS) 0.5 % SOLN Place 1 drop into both eyes daily as needed (dry eyes).   Cholecalciferol (VITAMIN D3) 5000 units TABS Take 5,000 Units by mouth every morning.   Continuous Blood Gluc Receiver (DEXCOM G6 RECEIVER) DEVI Use as instructed to check blood sugar daily.   Continuous Blood Gluc Sensor (DEXCOM G6 SENSOR) MISC Use as instructed to check blood sugar daily   Continuous Blood Gluc Transmit (DEXCOM G6 TRANSMITTER) MISC Use as instructed to check blood sugar.   dapagliflozin propanediol (FARXIGA) 5 MG TABS tablet Take 1 tablet (5 mg total) by mouth daily before breakfast.   diltiazem (CARDIZEM) 30 MG tablet Take 1 tablet every 4 hours AS NEEDED for heart rate >100   Evolocumab (REPATHA) 140 MG/ML SOSY Inject 1 mL into the skin every 14 (fourteen) days.  fenofibrate 54 MG tablet Take 54 mg by mouth daily.   glucose blood test strip Use as instructed to check blood sugar 2 times daily   insulin degludec (TRESIBA FLEXTOUCH) 100 UNIT/ML FlexTouch Pen Inject 8-10 Units into the skin daily.   Insulin Syringe-Needle U-100 (INSULIN SYRINGE .3CC/31GX5/16") 31G X 5/16" 0.3 ML MISC Use to inject insulin twice daily   levothyroxine (SYNTHROID) 112 MCG tablet Take 1 tablet (112 mcg total) by mouth daily.   losartan (COZAAR) 50 MG tablet Take 1 tablet (50 mg total) by mouth daily.   Multiple Vitamins-Minerals (ICAPS AREDS 2) CAPS Take 1 capsule by mouth 2 (two) times daily.   nitroGLYCERIN (NITROSTAT) 0.4 MG SL tablet Place 1 tablet (0.4 mg total) under the tongue every 5 (five) minutes as needed for chest pain.   pantoprazole (PROTONIX) 40 MG tablet Take 1 tablet (40 mg total) by mouth daily.   polyethylene glycol (MIRALAX / GLYCOLAX) 17 g packet Take 17 g by mouth daily as needed for moderate  constipation.   potassium chloride (KLOR-CON) 10 MEQ tablet Take 1.5 tablets (15 mEq total) by mouth daily.   ranolazine (RANEXA) 500 MG 12 hr tablet Take 1 tablet (500 mg total) by mouth 2 (two) times daily.   Semaglutide, 2 MG/DOSE, (OZEMPIC, 2 MG/DOSE,) 8 MG/3ML SOPN Inject 2 mg into the skin once a week.   torsemide (DEMADEX) 20 MG tablet Take 1.5 tablets (30 mg total) by mouth daily.   vitamin B-12 (CYANOCOBALAMIN) 500 MCG tablet Take 500 mcg by mouth daily.    Allergies: Demerol [meperidine], Epinephrine, Multaq [dronedarone], Sulfa antibiotics, Tetracyclines & related, Quinolones, Statins, and Imdur [isosorbide nitrate]  Social History   Tobacco Use   Smoking status: Former    Packs/day: 2.00    Years: 3.00    Pack years: 6.00    Types: Cigarettes    Quit date: 09/25/1968    Years since quitting: 52.6   Smokeless tobacco: Never   Tobacco comments:    10/05/2017 "smoked in my 20's"  Vaping Use   Vaping Use: Never used  Substance Use Topics   Alcohol use: Not Currently    Comment: 10/05/2017 "1 mixed drink q 2-3 months"   Drug use: Never    Family History  Problem Relation Age of Onset   Thyroid disease Mother 18       3 days after surgery   Heart defect Father 63       ?ascending aortic aneurysm   Breast cancer Sister    Hepatitis Brother    Diabetes Paternal Uncle    Dementia Paternal Uncle    Colon cancer Maternal Grandmother    Diabetes Maternal Grandmother    Diabetes Maternal Grandfather    Colon cancer Paternal Grandmother        41's   Rectal cancer Son 49   Breast cancer Half-Sister    Colon polyps Neg Hx    Stomach cancer Neg Hx    Liver cancer Neg Hx    Pancreatic cancer Neg Hx     Review of Systems: A 12-system review of systems was performed and was negative except as noted in the HPI.  --------------------------------------------------------------------------------------------------  Physical Exam: BP 130/84 (BP Location: Left Arm, Patient  Position: Sitting, Cuff Size: Large)    Pulse 61    Ht '5\' 2"'$  (1.575 m)    Wt 178 lb (80.7 kg)    SpO2 97%    BMI 32.56 kg/m   General:  NAD. Neck: No JVD  or HJR. Lungs: Clear to auscultation bilaterally without wheezes or crackles. Heart: Regular rate and rhythm without murmurs, rubs, or gallops. Abdomen: Soft, nontender, nondistended. Extremities: No lower extremity edema.  EKG (05/18/2021):  AV-paced rhythm.  Lab Results  Component Value Date   WBC 5.5 08/31/2020   HGB 13.6 08/31/2020   HCT 40.7 08/31/2020   MCV 87 08/31/2020   PLT 211 08/31/2020    Lab Results  Component Value Date   NA 145 (H) 11/20/2020   K 4.4 11/20/2020   CL 106 11/20/2020   CO2 26 11/20/2020   BUN 16 11/20/2020   CREATININE 1.31 (H) 11/20/2020   GLUCOSE 105 (H) 11/20/2020   ALT 13 08/20/2020    Lab Results  Component Value Date   CHOL 219 (H) 08/20/2020   HDL 41 08/20/2020   LDLCALC 124 (H) 08/20/2020   LDLDIRECT 63 09/17/2019   TRIG 269 (H) 08/20/2020   CHOLHDL 5.3 08/20/2020    --------------------------------------------------------------------------------------------------  ASSESSMENT AND PLAN: Coronary artery disease with stable angina: Dawn Garcia reports significant improvement in her dyspnea and chest discomfort with addition of ranolazine.  I suspect she has an element of microvascular dysfunction in the setting of her nonobstructive epicardial CAD seen by cath last year.  We will plan to continue current regimen of amlodipine and ranolazine.  If she were to have recrudescence of her symptoms, escalation of hemoglobin could be considered.  Chronic HFpEF and pulmonary hypertension: Dawn Garcia appears euvolemic today.  I do not think that her heart failure and pulmonary hypertension are driving her symptoms, as evidenced by symptomatic improvement with with normalization.  We will continue her current regimen of dapagliflozin (could be escalated to 10 mg daily), losartan, and  torsemide.  Paroxysmal atrial fibrillation: No palpitations reported.  AV paced rhythm noted on yesterday's EKG.  Continue apixaban 5 mg twice daily as well as as needed diltiazem for palpitations/tachycardia.  Sick sinus syndrome: EKG yesterday showed AV paced rhythm.  Heart rate parameters were adjusted by Dr. Caryl Comes given report of resting heart rates in the 40s at home.  Hyperlipidemia associated with type 2 diabetes mellitus: Continue evolocumab.  Repeat lipid panel should be obtained when Dawn Garcia has established with her new cardiologist in [place].  If LDL remains above 70, addition of ezetimibe or bempedoic acid will need to be considered.  Follow-up: Given Dawn Garcia's moved to [place], she can follow-up with [place] as needed.  She is already scheduled follow-up with a practice in [place].  Nelva Bush, MD 05/19/2021 3:01 PM

## 2021-05-20 ENCOUNTER — Encounter: Payer: Self-pay | Admitting: Internal Medicine

## 2021-05-21 ENCOUNTER — Encounter: Payer: Self-pay | Admitting: Internal Medicine

## 2021-05-23 ENCOUNTER — Other Ambulatory Visit: Payer: Self-pay | Admitting: Internal Medicine

## 2021-05-23 DIAGNOSIS — E1121 Type 2 diabetes mellitus with diabetic nephropathy: Secondary | ICD-10-CM

## 2021-05-24 ENCOUNTER — Other Ambulatory Visit: Payer: Self-pay | Admitting: Internal Medicine

## 2021-05-24 ENCOUNTER — Encounter: Payer: Self-pay | Admitting: Internal Medicine

## 2021-05-24 DIAGNOSIS — E1121 Type 2 diabetes mellitus with diabetic nephropathy: Secondary | ICD-10-CM

## 2021-05-24 MED ORDER — FLUCONAZOLE 150 MG PO TABS: 150.0000 mg | ORAL_TABLET | Freq: Once | ORAL | 1 refills | Status: AC

## 2021-05-25 MED ORDER — DEXCOM G6 SENSOR MISC: 3 refills | Status: DC

## 2021-06-18 ENCOUNTER — Ambulatory Visit (INDEPENDENT_AMBULATORY_CARE_PROVIDER_SITE_OTHER): Payer: Medicare HMO

## 2021-06-18 DIAGNOSIS — I495 Sick sinus syndrome: Secondary | ICD-10-CM | POA: Diagnosis not present

## 2021-06-19 LAB — CUP PACEART REMOTE DEVICE CHECK
Battery Remaining Longevity: 51 mo
Battery Remaining Percentage: 46 %
Battery Voltage: 2.99 V
Brady Statistic AP VP Percent: 20 %
Brady Statistic AP VS Percent: 62 %
Brady Statistic AS VP Percent: 1 %
Brady Statistic AS VS Percent: 18 %
Brady Statistic RA Percent Paced: 82 %
Brady Statistic RV Percent Paced: 20 %
Date Time Interrogation Session: 20230407103104
Implantable Lead Implant Date: 20170829
Implantable Lead Implant Date: 20170829
Implantable Lead Location: 753859
Implantable Lead Location: 753860
Implantable Pulse Generator Implant Date: 20170829
Lead Channel Impedance Value: 410 Ohm
Lead Channel Impedance Value: 590 Ohm
Lead Channel Pacing Threshold Amplitude: 0.75 V
Lead Channel Pacing Threshold Amplitude: 0.875 V
Lead Channel Pacing Threshold Pulse Width: 0.4 ms
Lead Channel Pacing Threshold Pulse Width: 0.6 ms
Lead Channel Sensing Intrinsic Amplitude: 12 mV
Lead Channel Sensing Intrinsic Amplitude: 3.2 mV
Lead Channel Setting Pacing Amplitude: 1.875
Lead Channel Setting Pacing Amplitude: 2.5 V
Lead Channel Setting Pacing Pulse Width: 0.4 ms
Lead Channel Setting Sensing Sensitivity: 2 mV
Pulse Gen Model: 2272
Pulse Gen Serial Number: 7945290

## 2021-07-02 ENCOUNTER — Encounter (HOSPITAL_BASED_OUTPATIENT_CLINIC_OR_DEPARTMENT_OTHER): Payer: Self-pay | Admitting: *Deleted

## 2021-07-06 NOTE — Progress Notes (Signed)
Remote pacemaker transmission.   

## 2021-07-07 ENCOUNTER — Telehealth: Payer: Self-pay

## 2021-07-07 NOTE — Telephone Encounter (Signed)
Patient transferred to [facility] vascular. I cancelled all upcoming remotes and marked inactive in paceart. ?

## 2021-07-23 ENCOUNTER — Other Ambulatory Visit: Payer: Self-pay

## 2021-07-23 DIAGNOSIS — E1121 Type 2 diabetes mellitus with diabetic nephropathy: Secondary | ICD-10-CM

## 2021-07-23 MED ORDER — DEXCOM G6 SENSOR MISC: 0 refills | Status: AC

## 2021-07-23 MED ORDER — DEXCOM G6 TRANSMITTER MISC: 0 refills | Status: AC

## 2021-07-29 ENCOUNTER — Telehealth: Payer: Self-pay

## 2021-07-29 ENCOUNTER — Other Ambulatory Visit (HOSPITAL_COMMUNITY): Payer: Self-pay

## 2021-07-29 NOTE — Telephone Encounter (Signed)
We received a request in Covermymeds to initiate prior authorization for her Education officer, community. Patient's insurance requires them to use DME supplier for Dexcom. Please make patient aware she must call [employer] to setup account [phone].

## 2021-07-30 NOTE — Telephone Encounter (Signed)
Lvm for pt advising insurance requires them to use DME supplier for Dexcom. Please make patient aware she must call [employer] to setup account [phone]. MyChart message sent as well.

## 2021-08-18 ENCOUNTER — Encounter: Payer: Self-pay | Admitting: Internal Medicine

## 2021-12-15 ENCOUNTER — Other Ambulatory Visit: Payer: Self-pay | Admitting: Medical

## 2021-12-15 DIAGNOSIS — I480 Paroxysmal atrial fibrillation: Secondary | ICD-10-CM

## 2021-12-15 NOTE — Telephone Encounter (Signed)
Prescription refill request for Eliquis received. Indication: Afib  Last office visit: 05/19/21 (End) Scr: 1.24 (12/21/20 via [place])  Age: 81 Weight: 80.7kg  Appropriate dose and refill sent to requested pharmacy.

## 2022-10-27 ENCOUNTER — Other Ambulatory Visit: Payer: Self-pay | Admitting: Internal Medicine

## 2022-10-27 DIAGNOSIS — I480 Paroxysmal atrial fibrillation: Secondary | ICD-10-CM

## 2022-10-27 NOTE — Telephone Encounter (Signed)
Refill Request.  

## 2022-10-27 NOTE — Telephone Encounter (Signed)
Pt last saw Dr End 05/19/21, but appears to be seeing a new cardiology practice [facility] in [place].  Attempted to call pt to advise will need pharmacy to contact current cardiologist for rx refill in [place]. Will deny rx refill and advise to forward refill request to Dr Ward Givens at [facility].
